# Patient Record
Sex: Male | Born: 1960 | Race: White | Hispanic: No | Marital: Single | Smoking: Current every day smoker
Health system: Southern US, Community
[De-identification: ages and names within clinical notes are randomized; demographics above are authoritative.]

## PROBLEM LIST (undated history)

## (undated) DIAGNOSIS — E039 Hypothyroidism, unspecified: Secondary | ICD-10-CM

## (undated) DIAGNOSIS — G629 Polyneuropathy, unspecified: Secondary | ICD-10-CM

## (undated) DIAGNOSIS — Z8709 Personal history of other diseases of the respiratory system: Secondary | ICD-10-CM

## (undated) DIAGNOSIS — Z9289 Personal history of other medical treatment: Secondary | ICD-10-CM

## (undated) DIAGNOSIS — Z8639 Personal history of other endocrine, nutritional and metabolic disease: Secondary | ICD-10-CM

## (undated) DIAGNOSIS — T50901A Poisoning by unspecified drugs, medicaments and biological substances, accidental (unintentional), initial encounter: Secondary | ICD-10-CM

## (undated) DIAGNOSIS — B1920 Unspecified viral hepatitis C without hepatic coma: Secondary | ICD-10-CM

## (undated) DIAGNOSIS — F259 Schizoaffective disorder, unspecified: Secondary | ICD-10-CM

## (undated) DIAGNOSIS — F4310 Post-traumatic stress disorder, unspecified: Secondary | ICD-10-CM

## (undated) DIAGNOSIS — F609 Personality disorder, unspecified: Secondary | ICD-10-CM

## (undated) DIAGNOSIS — R188 Other ascites: Secondary | ICD-10-CM

## (undated) DIAGNOSIS — N289 Disorder of kidney and ureter, unspecified: Secondary | ICD-10-CM

## (undated) DIAGNOSIS — M79606 Pain in leg, unspecified: Secondary | ICD-10-CM

## (undated) DIAGNOSIS — K219 Gastro-esophageal reflux disease without esophagitis: Secondary | ICD-10-CM

## (undated) DIAGNOSIS — D649 Anemia, unspecified: Secondary | ICD-10-CM

## (undated) DIAGNOSIS — G8929 Other chronic pain: Secondary | ICD-10-CM

## (undated) DIAGNOSIS — K7460 Unspecified cirrhosis of liver: Secondary | ICD-10-CM

## (undated) HISTORY — DX: Polyneuropathy, unspecified: G62.9

## (undated) HISTORY — PX: CERVICAL SPINE SURGERY: SHX589

---

## 1994-03-29 HISTORY — PX: OTHER SURGICAL HISTORY: SHX169

## 1998-01-09 ENCOUNTER — Emergency Department (HOSPITAL_COMMUNITY): Admission: EM | Admit: 1998-01-09 | Discharge: 1998-01-09 | Payer: Self-pay | Admitting: Emergency Medicine

## 1998-02-23 ENCOUNTER — Inpatient Hospital Stay: Admission: EM | Admit: 1998-02-23 | Discharge: 1998-03-14 | Payer: Self-pay | Admitting: *Deleted

## 1998-02-24 ENCOUNTER — Encounter: Payer: Self-pay | Admitting: *Deleted

## 1998-02-27 ENCOUNTER — Encounter: Payer: Self-pay | Admitting: General Surgery

## 1998-02-28 ENCOUNTER — Encounter: Payer: Self-pay | Admitting: Critical Care Medicine

## 1998-03-01 ENCOUNTER — Encounter: Payer: Self-pay | Admitting: Critical Care Medicine

## 1998-03-04 ENCOUNTER — Encounter: Payer: Self-pay | Admitting: Critical Care Medicine

## 1998-03-05 ENCOUNTER — Encounter: Payer: Self-pay | Admitting: Critical Care Medicine

## 1998-03-06 ENCOUNTER — Encounter: Payer: Self-pay | Admitting: Critical Care Medicine

## 1998-03-06 ENCOUNTER — Encounter: Payer: Self-pay | Admitting: *Deleted

## 1998-03-07 ENCOUNTER — Encounter: Payer: Self-pay | Admitting: *Deleted

## 1998-03-08 ENCOUNTER — Encounter: Payer: Self-pay | Admitting: Critical Care Medicine

## 1998-03-09 ENCOUNTER — Encounter: Payer: Self-pay | Admitting: *Deleted

## 1998-03-10 ENCOUNTER — Encounter: Payer: Self-pay | Admitting: Critical Care Medicine

## 1998-03-14 ENCOUNTER — Inpatient Hospital Stay (HOSPITAL_COMMUNITY)
Admission: RE | Admit: 1998-03-14 | Discharge: 1998-03-21 | Payer: Self-pay | Admitting: Physical Medicine and Rehabilitation

## 1998-03-20 ENCOUNTER — Encounter: Payer: Self-pay | Admitting: Physical Medicine and Rehabilitation

## 1998-04-07 ENCOUNTER — Emergency Department (HOSPITAL_COMMUNITY): Admission: EM | Admit: 1998-04-07 | Discharge: 1998-04-07 | Payer: Self-pay | Admitting: *Deleted

## 2000-07-17 ENCOUNTER — Inpatient Hospital Stay (HOSPITAL_COMMUNITY): Admission: AD | Admit: 2000-07-17 | Discharge: 2000-08-08 | Payer: Self-pay | Admitting: *Deleted

## 2001-02-08 ENCOUNTER — Emergency Department (HOSPITAL_COMMUNITY): Admission: EM | Admit: 2001-02-08 | Discharge: 2001-02-09 | Payer: Self-pay | Admitting: Emergency Medicine

## 2001-04-06 ENCOUNTER — Emergency Department (HOSPITAL_COMMUNITY): Admission: EM | Admit: 2001-04-06 | Discharge: 2001-04-06 | Payer: Self-pay | Admitting: Emergency Medicine

## 2001-05-29 ENCOUNTER — Encounter: Payer: Self-pay | Admitting: Internal Medicine

## 2001-05-29 ENCOUNTER — Encounter (INDEPENDENT_AMBULATORY_CARE_PROVIDER_SITE_OTHER): Payer: Self-pay

## 2001-05-29 ENCOUNTER — Ambulatory Visit (HOSPITAL_COMMUNITY): Admission: RE | Admit: 2001-05-29 | Discharge: 2001-05-29 | Payer: Self-pay | Admitting: Internal Medicine

## 2001-06-14 ENCOUNTER — Encounter: Payer: Self-pay | Admitting: Internal Medicine

## 2001-06-14 ENCOUNTER — Ambulatory Visit (HOSPITAL_COMMUNITY): Admission: RE | Admit: 2001-06-14 | Discharge: 2001-06-14 | Payer: Self-pay | Admitting: Internal Medicine

## 2001-11-10 ENCOUNTER — Ambulatory Visit (HOSPITAL_COMMUNITY): Admission: RE | Admit: 2001-11-10 | Discharge: 2001-11-10 | Payer: Self-pay | Admitting: Internal Medicine

## 2001-11-10 ENCOUNTER — Encounter: Payer: Self-pay | Admitting: Internal Medicine

## 2001-12-24 ENCOUNTER — Emergency Department (HOSPITAL_COMMUNITY): Admission: EM | Admit: 2001-12-24 | Discharge: 2001-12-24 | Payer: Self-pay

## 2002-01-02 ENCOUNTER — Emergency Department (HOSPITAL_COMMUNITY): Admission: EM | Admit: 2002-01-02 | Discharge: 2002-01-02 | Payer: Self-pay | Admitting: Emergency Medicine

## 2002-06-27 ENCOUNTER — Emergency Department (HOSPITAL_COMMUNITY): Admission: EM | Admit: 2002-06-27 | Discharge: 2002-06-27 | Payer: Self-pay | Admitting: Emergency Medicine

## 2003-04-06 ENCOUNTER — Emergency Department (HOSPITAL_COMMUNITY): Admission: EM | Admit: 2003-04-06 | Discharge: 2003-04-06 | Payer: Self-pay | Admitting: Emergency Medicine

## 2003-10-26 ENCOUNTER — Emergency Department (HOSPITAL_COMMUNITY): Admission: EM | Admit: 2003-10-26 | Discharge: 2003-10-26 | Payer: Self-pay | Admitting: Emergency Medicine

## 2003-11-24 ENCOUNTER — Emergency Department (HOSPITAL_COMMUNITY): Admission: EM | Admit: 2003-11-24 | Discharge: 2003-11-24 | Payer: Self-pay | Admitting: *Deleted

## 2004-01-21 ENCOUNTER — Emergency Department (HOSPITAL_COMMUNITY): Admission: EM | Admit: 2004-01-21 | Discharge: 2004-01-21 | Payer: Self-pay | Admitting: Emergency Medicine

## 2004-01-22 ENCOUNTER — Emergency Department (HOSPITAL_COMMUNITY): Admission: EM | Admit: 2004-01-22 | Discharge: 2004-01-22 | Payer: Self-pay | Admitting: Emergency Medicine

## 2004-03-04 ENCOUNTER — Ambulatory Visit: Payer: Self-pay | Admitting: Internal Medicine

## 2004-07-12 ENCOUNTER — Emergency Department (HOSPITAL_COMMUNITY): Admission: EM | Admit: 2004-07-12 | Discharge: 2004-07-13 | Payer: Self-pay | Admitting: Emergency Medicine

## 2004-11-12 ENCOUNTER — Emergency Department (HOSPITAL_COMMUNITY): Admission: EM | Admit: 2004-11-12 | Discharge: 2004-11-12 | Payer: Self-pay | Admitting: *Deleted

## 2004-12-26 ENCOUNTER — Emergency Department (HOSPITAL_COMMUNITY): Admission: EM | Admit: 2004-12-26 | Discharge: 2004-12-26 | Payer: Self-pay | Admitting: Emergency Medicine

## 2005-01-19 ENCOUNTER — Ambulatory Visit: Payer: Self-pay | Admitting: Internal Medicine

## 2005-01-23 ENCOUNTER — Emergency Department (HOSPITAL_COMMUNITY): Admission: EM | Admit: 2005-01-23 | Discharge: 2005-01-23 | Payer: Self-pay | Admitting: Emergency Medicine

## 2005-02-13 ENCOUNTER — Emergency Department (HOSPITAL_COMMUNITY): Admission: EM | Admit: 2005-02-13 | Discharge: 2005-02-13 | Payer: Self-pay | Admitting: Emergency Medicine

## 2005-02-24 ENCOUNTER — Ambulatory Visit: Payer: Self-pay | Admitting: Internal Medicine

## 2005-07-28 ENCOUNTER — Encounter: Payer: Self-pay | Admitting: *Deleted

## 2005-09-07 ENCOUNTER — Ambulatory Visit: Payer: Self-pay | Admitting: Internal Medicine

## 2006-05-25 ENCOUNTER — Ambulatory Visit: Payer: Self-pay | Admitting: Internal Medicine

## 2006-05-25 ENCOUNTER — Inpatient Hospital Stay (HOSPITAL_COMMUNITY): Admission: EM | Admit: 2006-05-25 | Discharge: 2006-05-28 | Payer: Self-pay | Admitting: Emergency Medicine

## 2006-07-05 ENCOUNTER — Inpatient Hospital Stay (HOSPITAL_COMMUNITY): Admission: EM | Admit: 2006-07-05 | Discharge: 2006-07-08 | Payer: Self-pay | Admitting: Emergency Medicine

## 2006-07-11 ENCOUNTER — Emergency Department (HOSPITAL_COMMUNITY): Admission: EM | Admit: 2006-07-11 | Discharge: 2006-07-11 | Payer: Self-pay | Admitting: *Deleted

## 2006-09-10 ENCOUNTER — Emergency Department (HOSPITAL_COMMUNITY): Admission: EM | Admit: 2006-09-10 | Discharge: 2006-09-10 | Payer: Self-pay | Admitting: Emergency Medicine

## 2007-08-31 ENCOUNTER — Emergency Department (HOSPITAL_COMMUNITY): Admission: EM | Admit: 2007-08-31 | Discharge: 2007-08-31 | Payer: Self-pay | Admitting: Emergency Medicine

## 2007-09-28 ENCOUNTER — Ambulatory Visit: Payer: Self-pay | Admitting: Internal Medicine

## 2008-08-02 ENCOUNTER — Inpatient Hospital Stay (HOSPITAL_COMMUNITY): Admission: AD | Admit: 2008-08-02 | Discharge: 2008-08-12 | Payer: Self-pay | Admitting: Psychiatry

## 2008-08-02 ENCOUNTER — Ambulatory Visit: Payer: Self-pay | Admitting: Psychiatry

## 2008-09-05 ENCOUNTER — Ambulatory Visit: Payer: Self-pay | Admitting: Internal Medicine

## 2008-09-05 LAB — CONVERTED CEMR LAB
Albumin: 3.7 g/dL (ref 3.5–5.2)
CO2: 24 meq/L (ref 19–32)
Calcium: 9.7 mg/dL (ref 8.4–10.5)
Chloride: 108 meq/L (ref 96–112)
Glucose, Bld: 103 mg/dL — ABNORMAL HIGH (ref 70–99)
Potassium: 4.3 meq/L (ref 3.5–5.3)
Sodium: 145 meq/L (ref 135–145)
Total Bilirubin: 0.6 mg/dL (ref 0.3–1.2)
Total Protein: 6.7 g/dL (ref 6.0–8.3)
Valproic Acid Lvl: 115.6 ug/mL — ABNORMAL HIGH (ref 50.0–100.0)

## 2009-03-11 ENCOUNTER — Ambulatory Visit: Payer: Self-pay | Admitting: Internal Medicine

## 2009-03-17 ENCOUNTER — Ambulatory Visit (HOSPITAL_COMMUNITY): Admission: RE | Admit: 2009-03-17 | Discharge: 2009-03-17 | Payer: Self-pay | Admitting: Internal Medicine

## 2009-04-24 ENCOUNTER — Ambulatory Visit: Payer: Self-pay | Admitting: Internal Medicine

## 2009-12-05 ENCOUNTER — Inpatient Hospital Stay (HOSPITAL_COMMUNITY): Admission: EM | Admit: 2009-12-05 | Discharge: 2009-12-09 | Payer: Self-pay | Admitting: Psychiatry

## 2009-12-05 ENCOUNTER — Ambulatory Visit: Payer: Self-pay | Admitting: Psychiatry

## 2009-12-27 DIAGNOSIS — T50901A Poisoning by unspecified drugs, medicaments and biological substances, accidental (unintentional), initial encounter: Secondary | ICD-10-CM

## 2009-12-27 HISTORY — DX: Poisoning by unspecified drugs, medicaments and biological substances, accidental (unintentional), initial encounter: T50.901A

## 2010-01-12 ENCOUNTER — Inpatient Hospital Stay (HOSPITAL_COMMUNITY)
Admission: EM | Admit: 2010-01-12 | Discharge: 2010-02-10 | Payer: Self-pay | Source: Home / Self Care | Admitting: Emergency Medicine

## 2010-01-12 ENCOUNTER — Ambulatory Visit: Payer: Self-pay | Admitting: Pulmonary Disease

## 2010-01-13 ENCOUNTER — Ambulatory Visit: Payer: Self-pay | Admitting: Cardiology

## 2010-01-13 ENCOUNTER — Encounter (INDEPENDENT_AMBULATORY_CARE_PROVIDER_SITE_OTHER): Payer: Self-pay | Admitting: Internal Medicine

## 2010-01-15 ENCOUNTER — Encounter (INDEPENDENT_AMBULATORY_CARE_PROVIDER_SITE_OTHER): Payer: Self-pay | Admitting: Internal Medicine

## 2010-01-21 HISTORY — PX: OTHER SURGICAL HISTORY: SHX169

## 2010-01-30 ENCOUNTER — Encounter (INDEPENDENT_AMBULATORY_CARE_PROVIDER_SITE_OTHER): Payer: Self-pay | Admitting: Family Medicine

## 2010-01-31 ENCOUNTER — Encounter (INDEPENDENT_AMBULATORY_CARE_PROVIDER_SITE_OTHER): Payer: Self-pay | Admitting: Family Medicine

## 2010-02-04 DIAGNOSIS — F259 Schizoaffective disorder, unspecified: Secondary | ICD-10-CM

## 2010-02-09 DIAGNOSIS — F259 Schizoaffective disorder, unspecified: Secondary | ICD-10-CM

## 2010-02-16 ENCOUNTER — Ambulatory Visit: Payer: Self-pay | Admitting: Vascular Surgery

## 2010-04-29 ENCOUNTER — Encounter (INDEPENDENT_AMBULATORY_CARE_PROVIDER_SITE_OTHER): Payer: Self-pay | Admitting: Family Medicine

## 2010-04-29 LAB — CONVERTED CEMR LAB
ALT: 10 units/L (ref 0–53)
AST: 27 units/L (ref 0–37)
Alkaline Phosphatase: 100 units/L (ref 39–117)
Sodium: 138 meq/L (ref 135–145)
Total Bilirubin: 1.5 mg/dL — ABNORMAL HIGH (ref 0.3–1.2)
Total Protein: 7.5 g/dL (ref 6.0–8.3)

## 2010-05-14 ENCOUNTER — Emergency Department (HOSPITAL_COMMUNITY)
Admission: EM | Admit: 2010-05-14 | Discharge: 2010-05-15 | Disposition: A | Discharge status: Home / Self Care | Payer: Medicaid Other | Attending: Emergency Medicine | Admitting: Emergency Medicine

## 2010-05-14 ENCOUNTER — Emergency Department (HOSPITAL_COMMUNITY): Payer: Medicaid Other

## 2010-05-14 DIAGNOSIS — F4310 Post-traumatic stress disorder, unspecified: Secondary | ICD-10-CM | POA: Insufficient documentation

## 2010-05-14 DIAGNOSIS — F259 Schizoaffective disorder, unspecified: Secondary | ICD-10-CM | POA: Insufficient documentation

## 2010-05-14 DIAGNOSIS — F29 Unspecified psychosis not due to a substance or known physiological condition: Secondary | ICD-10-CM | POA: Insufficient documentation

## 2010-05-14 LAB — DIFFERENTIAL
Basophils Relative: 1 % (ref 0–1)
Eosinophils Absolute: 0.5 10*3/uL (ref 0.0–0.7)
Lymphs Abs: 3.3 10*3/uL (ref 0.7–4.0)
Monocytes Absolute: 0.5 10*3/uL (ref 0.1–1.0)
Monocytes Relative: 5 % (ref 3–12)

## 2010-05-14 LAB — COMPREHENSIVE METABOLIC PANEL
Albumin: 3.9 g/dL (ref 3.5–5.2)
BUN: 6 mg/dL (ref 6–23)
Calcium: 10.1 mg/dL (ref 8.4–10.5)
Chloride: 107 mEq/L (ref 96–112)
Creatinine, Ser: 1.35 mg/dL (ref 0.4–1.5)
Total Bilirubin: 1.7 mg/dL — ABNORMAL HIGH (ref 0.3–1.2)

## 2010-05-14 LAB — SALICYLATE LEVEL: Salicylate Lvl: <4 mg/dL (ref 2.8–20.0)

## 2010-05-14 LAB — CBC
MCH: 32.3 pg (ref 26.0–34.0)
MCHC: 34.9 g/dL (ref 30.0–36.0)
MCV: 92.7 fL (ref 78.0–100.0)
Platelets: 138 10*3/uL — ABNORMAL LOW (ref 150–400)
RDW: 14.2 % (ref 11.5–15.5)

## 2010-05-14 LAB — ACETAMINOPHEN LEVEL: Acetaminophen (Tylenol), Serum: <10 ug/mL — ABNORMAL LOW (ref 10–30)

## 2010-05-15 DIAGNOSIS — F39 Unspecified mood [affective] disorder: Secondary | ICD-10-CM

## 2010-05-15 LAB — URINALYSIS, ROUTINE W REFLEX MICROSCOPIC
Ketones, ur: NEGATIVE mg/dL
Urine Glucose, Fasting: NEGATIVE mg/dL
pH: 6 (ref 5.0–8.0)

## 2010-05-15 LAB — RAPID URINE DRUG SCREEN, HOSP PERFORMED
Barbiturates: NOT DETECTED
Cocaine: NOT DETECTED

## 2010-05-17 NOTE — Consult Note (Signed)
  NAME:  [name], [name] NO.:  [id number]  MEDICAL RECORD NO.:  [id number]           PATIENT TYPE:  E  LOCATION:  WLED                         FACILITY:  [facility]  PHYSICIAN:  Eulogio Ditch, MD DATE OF BIRTH:  1960/11/26  DATE OF CONSULTATION: DATE OF DISCHARGE:                                CONSULTATION   HISTORY OF PRESENT ILLNESS:  I saw the patient and reviewed the comprehensive assessment part 1 and also the medical records in the past.  Last consult done by me and the discharge summary by Dr. Electa Sniff. The patient was brought to the hospital by the case manager.  As per them, the patient complained of not feeling well physically and also his parents want the patient to be evaluated because of his behavior for the last few days.  He is not okay.  The patient has been agitated and aggressive at home, not sleeping or eating for the last several days. When I saw the patient, the patient is very logical and goal directed. I have seen this patient in the past and he was in a very bad shape, but at this time he is much intact.  He denied any suicidal or homicidal ideations.  He is not entirely preoccupied, not responding to any stimuli.  The patient denies hearing any voices.  The patient told me that he is followed by the ACT team and they are involved with his care very actively.  The patient also told me that he is compliant with medication and he denied any side effects from Geodon or Zoloft.  MENTAL STATUS EXAMINATION:  The patient is calm and cooperative during interview.  Fair eye contact.  Hygiene and grooming normal.  Speech normal.  No psychomotor agitation or retardation noted during the interview.  Mood, euthymic.  Affect and mood congruent.  Thought process logical and goal directed.  Thought content not suicidal or homicidal, not delusional thought perception.  No audiovisual hallucination reported.  Not internally preoccupied.  Cognition alert,  awake, and oriented x3.  Memory immediate, recent remote fair.  Attention, concentration fair.  Abstraction ability good.  Fund of knowledge fair. Insight and judgment intact.  DIAGNOSES:  AXIS I:  Schizoaffective disorder, posttraumatic stress disorder. AXIS II:  Deferred. AXIS III:  Hypertension, gout, and see the medical notes also. AXIS IV:  Psychosocial issues and long mental history. AXIS V:  50 to 55.  RECOMMENDATIONS: 1. We will speak with the patient's parents who was sent the patient     for evaluation. 2. We will also speak with the case worker of the ACT team. 3. Once we get collateral information we will make decision whether     this patient needs further observation or the admission in the     hospital for few days.    Eulogio Ditch, MD    SA/MEDQ  D:  05/15/2010  T:  05/15/2010  Job:  [id number]  Electronically Signed by Eulogio Ditch  on 05/17/2010 06:09:05 AM

## 2010-06-09 LAB — GLUCOSE, CAPILLARY
Glucose-Capillary: 102 mg/dL — ABNORMAL HIGH (ref 70–99)
Glucose-Capillary: 103 mg/dL — ABNORMAL HIGH (ref 70–99)
Glucose-Capillary: 103 mg/dL — ABNORMAL HIGH (ref 70–99)
Glucose-Capillary: 104 mg/dL — ABNORMAL HIGH (ref 70–99)
Glucose-Capillary: 106 mg/dL — ABNORMAL HIGH (ref 70–99)
Glucose-Capillary: 107 mg/dL — ABNORMAL HIGH (ref 70–99)
Glucose-Capillary: 108 mg/dL — ABNORMAL HIGH (ref 70–99)
Glucose-Capillary: 108 mg/dL — ABNORMAL HIGH (ref 70–99)
Glucose-Capillary: 109 mg/dL — ABNORMAL HIGH (ref 70–99)
Glucose-Capillary: 113 mg/dL — ABNORMAL HIGH (ref 70–99)
Glucose-Capillary: 115 mg/dL — ABNORMAL HIGH (ref 70–99)
Glucose-Capillary: 115 mg/dL — ABNORMAL HIGH (ref 70–99)
Glucose-Capillary: 119 mg/dL — ABNORMAL HIGH (ref 70–99)
Glucose-Capillary: 121 mg/dL — ABNORMAL HIGH (ref 70–99)
Glucose-Capillary: 124 mg/dL — ABNORMAL HIGH (ref 70–99)
Glucose-Capillary: 126 mg/dL — ABNORMAL HIGH (ref 70–99)
Glucose-Capillary: 126 mg/dL — ABNORMAL HIGH (ref 70–99)
Glucose-Capillary: 128 mg/dL — ABNORMAL HIGH (ref 70–99)
Glucose-Capillary: 134 mg/dL — ABNORMAL HIGH (ref 70–99)
Glucose-Capillary: 134 mg/dL — ABNORMAL HIGH (ref 70–99)
Glucose-Capillary: 145 mg/dL — ABNORMAL HIGH (ref 70–99)
Glucose-Capillary: 146 mg/dL — ABNORMAL HIGH (ref 70–99)
Glucose-Capillary: 147 mg/dL — ABNORMAL HIGH (ref 70–99)
Glucose-Capillary: 148 mg/dL — ABNORMAL HIGH (ref 70–99)
Glucose-Capillary: 150 mg/dL — ABNORMAL HIGH (ref 70–99)
Glucose-Capillary: 163 mg/dL — ABNORMAL HIGH (ref 70–99)
Glucose-Capillary: 174 mg/dL — ABNORMAL HIGH (ref 70–99)
Glucose-Capillary: 513 mg/dL — ABNORMAL HIGH (ref 70–99)
Glucose-Capillary: 62 mg/dL — ABNORMAL LOW (ref 70–99)
Glucose-Capillary: 72 mg/dL (ref 70–99)
Glucose-Capillary: 73 mg/dL (ref 70–99)
Glucose-Capillary: 73 mg/dL (ref 70–99)
Glucose-Capillary: 75 mg/dL (ref 70–99)
Glucose-Capillary: 76 mg/dL (ref 70–99)
Glucose-Capillary: 80 mg/dL (ref 70–99)
Glucose-Capillary: 81 mg/dL (ref 70–99)
Glucose-Capillary: 84 mg/dL (ref 70–99)
Glucose-Capillary: 85 mg/dL (ref 70–99)
Glucose-Capillary: 85 mg/dL (ref 70–99)
Glucose-Capillary: 88 mg/dL (ref 70–99)
Glucose-Capillary: 89 mg/dL (ref 70–99)
Glucose-Capillary: 89 mg/dL (ref 70–99)
Glucose-Capillary: 92 mg/dL (ref 70–99)
Glucose-Capillary: 93 mg/dL (ref 70–99)
Glucose-Capillary: 95 mg/dL (ref 70–99)
Glucose-Capillary: 95 mg/dL (ref 70–99)
Glucose-Capillary: 95 mg/dL (ref 70–99)
Glucose-Capillary: 96 mg/dL (ref 70–99)
Glucose-Capillary: 97 mg/dL (ref 70–99)
Glucose-Capillary: 97 mg/dL (ref 70–99)
Glucose-Capillary: 99 mg/dL (ref 70–99)

## 2010-06-09 LAB — BLOOD GAS, ARTERIAL
Drawn by: 331461
O2 Content: 2 L/min
O2 Content: 5 L/min
Patient temperature: 98.6
TCO2: 31.5 mmol/L (ref 0–100)
pCO2 arterial: 38.6 mmHg (ref 35.0–45.0)
pH, Arterial: 7.424 (ref 7.350–7.450)
pH, Arterial: 7.454 — ABNORMAL HIGH (ref 7.350–7.450)

## 2010-06-09 LAB — COMPREHENSIVE METABOLIC PANEL
ALT: 12 U/L (ref 0–53)
ALT: 13 U/L (ref 0–53)
ALT: 14 U/L (ref 0–53)
ALT: 15 U/L (ref 0–53)
ALT: 15 U/L (ref 0–53)
ALT: 15 U/L (ref 0–53)
ALT: 15 U/L (ref 0–53)
AST: 19 U/L (ref 0–37)
AST: 24 U/L (ref 0–37)
AST: 27 U/L (ref 0–37)
AST: 27 U/L (ref 0–37)
AST: 28 U/L (ref 0–37)
AST: 29 U/L (ref 0–37)
Albumin: 2.1 g/dL — ABNORMAL LOW (ref 3.5–5.2)
Albumin: 2.3 g/dL — ABNORMAL LOW (ref 3.5–5.2)
Albumin: 2.4 g/dL — ABNORMAL LOW (ref 3.5–5.2)
Alkaline Phosphatase: 57 U/L (ref 39–117)
Alkaline Phosphatase: 63 U/L (ref 39–117)
Alkaline Phosphatase: 69 U/L (ref 39–117)
BUN: 44 mg/dL — ABNORMAL HIGH (ref 6–23)
BUN: 52 mg/dL — ABNORMAL HIGH (ref 6–23)
BUN: 57 mg/dL — ABNORMAL HIGH (ref 6–23)
BUN: 64 mg/dL — ABNORMAL HIGH (ref 6–23)
BUN: 76 mg/dL — ABNORMAL HIGH (ref 6–23)
CO2: 32 mEq/L (ref 19–32)
CO2: 32 mEq/L (ref 19–32)
CO2: 34 mEq/L — ABNORMAL HIGH (ref 19–32)
CO2: 34 mEq/L — ABNORMAL HIGH (ref 19–32)
CO2: 34 mEq/L — ABNORMAL HIGH (ref 19–32)
CO2: 35 mEq/L — ABNORMAL HIGH (ref 19–32)
CO2: 36 mEq/L — ABNORMAL HIGH (ref 19–32)
CO2: 37 mEq/L — ABNORMAL HIGH (ref 19–32)
Calcium: 8.4 mg/dL (ref 8.4–10.5)
Calcium: 9 mg/dL (ref 8.4–10.5)
Calcium: 9 mg/dL (ref 8.4–10.5)
Calcium: 9 mg/dL (ref 8.4–10.5)
Calcium: 9.1 mg/dL (ref 8.4–10.5)
Calcium: 9.2 mg/dL (ref 8.4–10.5)
Calcium: 9.2 mg/dL (ref 8.4–10.5)
Calcium: 9.3 mg/dL (ref 8.4–10.5)
Calcium: 9.5 mg/dL (ref 8.4–10.5)
Chloride: 101 mEq/L (ref 96–112)
Chloride: 109 mEq/L (ref 96–112)
Chloride: 112 mEq/L (ref 96–112)
Chloride: 98 mEq/L (ref 96–112)
Chloride: 99 mEq/L (ref 96–112)
Creatinine, Ser: 1.85 mg/dL — ABNORMAL HIGH (ref 0.4–1.5)
Creatinine, Ser: 1.9 mg/dL — ABNORMAL HIGH (ref 0.4–1.5)
Creatinine, Ser: 1.93 mg/dL — ABNORMAL HIGH (ref 0.4–1.5)
Creatinine, Ser: 2.25 mg/dL — ABNORMAL HIGH (ref 0.4–1.5)
Creatinine, Ser: 2.39 mg/dL — ABNORMAL HIGH (ref 0.4–1.5)
Creatinine, Ser: 2.55 mg/dL — ABNORMAL HIGH (ref 0.4–1.5)
GFR calc Af Amer: 33 mL/min — ABNORMAL LOW (ref >60)
GFR calc Af Amer: 38 mL/min — ABNORMAL LOW (ref >60)
GFR calc Af Amer: 41 mL/min — ABNORMAL LOW (ref >60)
GFR calc Af Amer: 42 mL/min — ABNORMAL LOW (ref >60)
GFR calc Af Amer: 46 mL/min — ABNORMAL LOW (ref >60)
GFR calc Af Amer: 46 mL/min — ABNORMAL LOW (ref >60)
GFR calc Af Amer: 49 mL/min — ABNORMAL LOW (ref >60)
GFR calc non Af Amer: 27 mL/min — ABNORMAL LOW (ref >60)
GFR calc non Af Amer: 29 mL/min — ABNORMAL LOW (ref >60)
GFR calc non Af Amer: 31 mL/min — ABNORMAL LOW (ref >60)
GFR calc non Af Amer: 35 mL/min — ABNORMAL LOW (ref >60)
GFR calc non Af Amer: 36 mL/min — ABNORMAL LOW (ref >60)
GFR calc non Af Amer: 38 mL/min — ABNORMAL LOW (ref >60)
GFR calc non Af Amer: 38 mL/min — ABNORMAL LOW (ref >60)
GFR calc non Af Amer: 39 mL/min — ABNORMAL LOW (ref >60)
GFR calc non Af Amer: 41 mL/min — ABNORMAL LOW (ref >60)
Glucose, Bld: 111 mg/dL — ABNORMAL HIGH (ref 70–99)
Glucose, Bld: 71 mg/dL (ref 70–99)
Glucose, Bld: 73 mg/dL (ref 70–99)
Glucose, Bld: 76 mg/dL (ref 70–99)
Glucose, Bld: 81 mg/dL (ref 70–99)
Glucose, Bld: 88 mg/dL (ref 70–99)
Glucose, Bld: 89 mg/dL (ref 70–99)
Glucose, Bld: 90 mg/dL (ref 70–99)
Glucose, Bld: 91 mg/dL (ref 70–99)
Potassium: 3.9 mEq/L (ref 3.5–5.1)
Potassium: 3.9 mEq/L (ref 3.5–5.1)
Potassium: 4.3 mEq/L (ref 3.5–5.1)
Sodium: 138 mEq/L (ref 135–145)
Sodium: 140 mEq/L (ref 135–145)
Sodium: 150 mEq/L — ABNORMAL HIGH (ref 135–145)
Sodium: 150 mEq/L — ABNORMAL HIGH (ref 135–145)
Sodium: 151 mEq/L — ABNORMAL HIGH (ref 135–145)
Total Bilirubin: 0.6 mg/dL (ref 0.3–1.2)
Total Bilirubin: 0.6 mg/dL (ref 0.3–1.2)
Total Bilirubin: 0.7 mg/dL (ref 0.3–1.2)
Total Bilirubin: 0.7 mg/dL (ref 0.3–1.2)
Total Bilirubin: 0.9 mg/dL (ref 0.3–1.2)
Total Protein: 5.5 g/dL — ABNORMAL LOW (ref 6.0–8.3)
Total Protein: 5.7 g/dL — ABNORMAL LOW (ref 6.0–8.3)
Total Protein: 5.8 g/dL — ABNORMAL LOW (ref 6.0–8.3)
Total Protein: 6 g/dL (ref 6.0–8.3)
Total Protein: 6.1 g/dL (ref 6.0–8.3)
Total Protein: 6.4 g/dL (ref 6.0–8.3)

## 2010-06-09 LAB — CBC
HCT: 26 % — ABNORMAL LOW (ref 39.0–52.0)
HCT: 26.5 % — ABNORMAL LOW (ref 39.0–52.0)
HCT: 27.5 % — ABNORMAL LOW (ref 39.0–52.0)
HCT: 28.3 % — ABNORMAL LOW (ref 39.0–52.0)
HCT: 29.6 % — ABNORMAL LOW (ref 39.0–52.0)
Hemoglobin: 8.5 g/dL — ABNORMAL LOW (ref 13.0–17.0)
Hemoglobin: 8.9 g/dL — ABNORMAL LOW (ref 13.0–17.0)
Hemoglobin: 8.9 g/dL — ABNORMAL LOW (ref 13.0–17.0)
Hemoglobin: 8.9 g/dL — ABNORMAL LOW (ref 13.0–17.0)
Hemoglobin: 9.2 g/dL — ABNORMAL LOW (ref 13.0–17.0)
Hemoglobin: 9.4 g/dL — ABNORMAL LOW (ref 13.0–17.0)
MCH: 34.1 pg — ABNORMAL HIGH (ref 26.0–34.0)
MCH: 34.2 pg — ABNORMAL HIGH (ref 26.0–34.0)
MCH: 34.3 pg — ABNORMAL HIGH (ref 26.0–34.0)
MCH: 34.8 pg — ABNORMAL HIGH (ref 26.0–34.0)
MCHC: 33.4 g/dL (ref 30.0–36.0)
MCHC: 33.5 g/dL (ref 30.0–36.0)
MCHC: 33.7 g/dL (ref 30.0–36.0)
MCHC: 33.8 g/dL (ref 30.0–36.0)
MCHC: 34.2 g/dL (ref 30.0–36.0)
MCHC: 34.3 g/dL (ref 30.0–36.0)
MCHC: 34.3 g/dL (ref 30.0–36.0)
MCV: 101.3 fL — ABNORMAL HIGH (ref 78.0–100.0)
MCV: 101.6 fL — ABNORMAL HIGH (ref 78.0–100.0)
MCV: 102.4 fL — ABNORMAL HIGH (ref 78.0–100.0)
MCV: 102.5 fL — ABNORMAL HIGH (ref 78.0–100.0)
MCV: 102.7 fL — ABNORMAL HIGH (ref 78.0–100.0)
Platelets: 103 10*3/uL — ABNORMAL LOW (ref 150–400)
Platelets: 111 10*3/uL — ABNORMAL LOW (ref 150–400)
Platelets: 170 10*3/uL (ref 150–400)
RBC: 2.58 MIL/uL — ABNORMAL LOW (ref 4.22–5.81)
RBC: 2.6 MIL/uL — ABNORMAL LOW (ref 4.22–5.81)
RBC: 2.77 MIL/uL — ABNORMAL LOW (ref 4.22–5.81)
RBC: 2.8 MIL/uL — ABNORMAL LOW (ref 4.22–5.81)
RBC: 2.85 MIL/uL — ABNORMAL LOW (ref 4.22–5.81)
RDW: 14.9 % (ref 11.5–15.5)
RDW: 15 % (ref 11.5–15.5)
RDW: 15.5 % (ref 11.5–15.5)
RDW: 15.8 % — ABNORMAL HIGH (ref 11.5–15.5)
RDW: 15.9 % — ABNORMAL HIGH (ref 11.5–15.5)
RDW: 15.9 % — ABNORMAL HIGH (ref 11.5–15.5)
RDW: 16 % — ABNORMAL HIGH (ref 11.5–15.5)
WBC: 4.7 10*3/uL (ref 4.0–10.5)
WBC: 5 10*3/uL (ref 4.0–10.5)
WBC: 6.1 10*3/uL (ref 4.0–10.5)
WBC: 7 10*3/uL (ref 4.0–10.5)

## 2010-06-09 LAB — BASIC METABOLIC PANEL
BUN: 84 mg/dL — ABNORMAL HIGH (ref 6–23)
CO2: 31 mEq/L (ref 19–32)
CO2: 33 mEq/L — ABNORMAL HIGH (ref 19–32)
Calcium: 9 mg/dL (ref 8.4–10.5)
Chloride: 114 mEq/L — ABNORMAL HIGH (ref 96–112)
GFR calc Af Amer: 44 mL/min — ABNORMAL LOW (ref >60)
GFR calc non Af Amer: 37 mL/min — ABNORMAL LOW (ref >60)
GFR calc non Af Amer: 42 mL/min — ABNORMAL LOW (ref >60)
Glucose, Bld: 78 mg/dL (ref 70–99)
Potassium: 3.9 mEq/L (ref 3.5–5.1)
Potassium: 4.3 mEq/L (ref 3.5–5.1)
Sodium: 137 mEq/L (ref 135–145)
Sodium: 149 mEq/L — ABNORMAL HIGH (ref 135–145)
Sodium: 151 mEq/L — ABNORMAL HIGH (ref 135–145)
Sodium: 152 mEq/L — ABNORMAL HIGH (ref 135–145)

## 2010-06-09 LAB — PHOSPHORUS
Phosphorus: 3.6 mg/dL (ref 2.3–4.6)
Phosphorus: 5.1 mg/dL — ABNORMAL HIGH (ref 2.3–4.6)
Phosphorus: 5.1 mg/dL — ABNORMAL HIGH (ref 2.3–4.6)
Phosphorus: 6.6 mg/dL — ABNORMAL HIGH (ref 2.3–4.6)

## 2010-06-09 LAB — CARDIAC PANEL(CRET KIN+CKTOT+MB+TROPI)
CK, MB: 0.7 ng/mL (ref 0.3–4.0)
CK, MB: 0.8 ng/mL (ref 0.3–4.0)
CK, MB: 1 ng/mL (ref 0.3–4.0)
Relative Index: INVALID (ref 0.0–2.5)
Total CK: 27 U/L (ref 7–232)
Troponin I: 0.02 ng/mL (ref 0.00–0.06)
Troponin I: 0.05 ng/mL (ref 0.00–0.06)

## 2010-06-09 LAB — MAGNESIUM
Magnesium: 2.1 mg/dL (ref 1.5–2.5)
Magnesium: 2.1 mg/dL (ref 1.5–2.5)
Magnesium: 2.2 mg/dL (ref 1.5–2.5)
Magnesium: 2.2 mg/dL (ref 1.5–2.5)
Magnesium: 2.5 mg/dL (ref 1.5–2.5)
Magnesium: 3 mg/dL — ABNORMAL HIGH (ref 1.5–2.5)

## 2010-06-09 LAB — HEPARIN INDUCED THROMBOCYTOPENIA PNL
Heparin Induced Plt Ab: POSITIVE
Patient O.D.: 0.478
UFH Low Dose 0.1 IU/mL: 0 % Release
UFH Low Dose 0.5 IU/mL: 0 % Release
UFH SRA Result: NEGATIVE

## 2010-06-09 LAB — VALPROIC ACID LEVEL: Valproic Acid Lvl: 72.5 ug/mL (ref 50.0–100.0)

## 2010-06-09 LAB — D-DIMER, QUANTITATIVE: D-Dimer, Quant: 2.87 ug/mL-FEU — ABNORMAL HIGH (ref 0.00–0.48)

## 2010-06-09 LAB — BRAIN NATRIURETIC PEPTIDE: Pro B Natriuretic peptide (BNP): NEGATIVE pg/mL (ref 0.0–100.0)

## 2010-06-10 LAB — BLOOD GAS, ARTERIAL
Acid-Base Excess: 0.2 mmol/L (ref 0.0–2.0)
Acid-Base Excess: 0.6 mmol/L (ref 0.0–2.0)
Acid-Base Excess: 2.1 mmol/L — ABNORMAL HIGH (ref 0.0–2.0)
Acid-Base Excess: 2.9 mmol/L — ABNORMAL HIGH (ref 0.0–2.0)
Acid-Base Excess: 3.4 mmol/L — ABNORMAL HIGH (ref 0.0–2.0)
Acid-Base Excess: 0.9 mmol/L (ref 0.0–2.0)
Acid-Base Excess: 1.6 mmol/L (ref 0.0–2.0)
Acid-base deficit: 0.4 mmol/L (ref 0.0–2.0)
Acid-base deficit: 1.6 mmol/L (ref 0.0–2.0)
Acid-base deficit: 2.3 mmol/L — ABNORMAL HIGH (ref 0.0–2.0)
Bicarbonate: 21.2 mEq/L (ref 20.0–24.0)
Bicarbonate: 21.4 mEq/L (ref 20.0–24.0)
Bicarbonate: 22.3 mEq/L (ref 20.0–24.0)
Bicarbonate: 22.6 mEq/L (ref 20.0–24.0)
Bicarbonate: 23.6 mEq/L (ref 20.0–24.0)
Bicarbonate: 25.5 mEq/L — ABNORMAL HIGH (ref 20.0–24.0)
Bicarbonate: 29.5 mEq/L — ABNORMAL HIGH (ref 20.0–24.0)
Bicarbonate: 24.4 mEq/L — ABNORMAL HIGH (ref 20.0–24.0)
Bicarbonate: 24.9 mEq/L — ABNORMAL HIGH (ref 20.0–24.0)
Bicarbonate: 25.2 mEq/L — ABNORMAL HIGH (ref 20.0–24.0)
Drawn by: 23588
Drawn by: 23588
Drawn by: 252031
Drawn by: 277551
Drawn by: 295031
Drawn by: 331461
Drawn by: 331461
Drawn by: 331461
Drawn by: 331461
Drawn by: 336861
Drawn by: 336861
Drawn by: 129711
FIO2: 0.6 %
FIO2: 0.6 %
FIO2: 0.7 %
FIO2: 0.8 %
FIO2: 1 %
FIO2: 1 %
FIO2: 1 %
FIO2: 1 %
FIO2: 1 %
FIO2: 0.45 %
FIO2: 0.5 %
MECHVT: 0.6 mL
MECHVT: 600 mL
MECHVT: 600 mL
MECHVT: 600 mL
MECHVT: 600 mL
MECHVT: 780 mL
MECHVT: 780 mL
MECHVT: 780 mL
MECHVT: 780 mL
O2 Saturation: 78.8 %
O2 Saturation: 81.6 %
O2 Saturation: 88.1 %
O2 Saturation: 88.6 %
O2 Saturation: 91 %
O2 Saturation: 93.8 %
O2 Saturation: 94.7 %
O2 Saturation: 95 %
O2 Saturation: 89.1 %
O2 Saturation: 92.8 %
PEEP: 10 cmH2O
PEEP: 12 cmH2O
PEEP: 14 cmH2O
PEEP: 16 cmH2O
PEEP: 5 cmH2O
PEEP: 8 cmH2O
PEEP: 10 cmH2O
PEEP: 8 cmH2O
Patient temperature: 98.6
Patient temperature: 98.6
Patient temperature: 98.6
Patient temperature: 98.6
Patient temperature: 98.6
Patient temperature: 98.6
Patient temperature: 98.6
Patient temperature: 98.6
Patient temperature: 98.6
RATE: 20 resp/min
RATE: 24 resp/min
RATE: 24 resp/min
RATE: 24 resp/min
RATE: 27 resp/min
RATE: 27 resp/min
RATE: 27 resp/min
RATE: 27 resp/min
RATE: 27 resp/min
RATE: 27 resp/min
TCO2: 20 mmol/L (ref 0–100)
TCO2: 20.1 mmol/L (ref 0–100)
TCO2: 20.8 mmol/L (ref 0–100)
TCO2: 20.9 mmol/L (ref 0–100)
TCO2: 21.3 mmol/L (ref 0–100)
TCO2: 27.8 mmol/L (ref 0–100)
TCO2: 23.4 mmol/L (ref 0–100)
TCO2: 23.5 mmol/L (ref 0–100)
pCO2 arterial: 27.5 mmHg — ABNORMAL LOW (ref 35.0–45.0)
pCO2 arterial: 30.7 mmHg — ABNORMAL LOW (ref 35.0–45.0)
pCO2 arterial: 31.5 mmHg — ABNORMAL LOW (ref 35.0–45.0)
pCO2 arterial: 34.4 mmHg — ABNORMAL LOW (ref 35.0–45.0)
pCO2 arterial: 37.2 mmHg (ref 35.0–45.0)
pCO2 arterial: 37.4 mmHg (ref 35.0–45.0)
pCO2 arterial: 55.4 mmHg — ABNORMAL HIGH (ref 35.0–45.0)
pCO2 arterial: 39.5 mmHg (ref 35.0–45.0)
pH, Arterial: 7.377 (ref 7.350–7.450)
pH, Arterial: 7.378 (ref 7.350–7.450)
pH, Arterial: 7.414 (ref 7.350–7.450)
pH, Arterial: 7.448 (ref 7.350–7.450)
pH, Arterial: 7.461 — ABNORMAL HIGH (ref 7.350–7.450)
pH, Arterial: 7.468 — ABNORMAL HIGH (ref 7.350–7.450)
pH, Arterial: 7.475 — ABNORMAL HIGH (ref 7.350–7.450)
pH, Arterial: 7.525 — ABNORMAL HIGH (ref 7.350–7.450)
pH, Arterial: 7.584 — ABNORMAL HIGH (ref 7.350–7.450)
pH, Arterial: 7.399 (ref 7.350–7.450)
pH, Arterial: 7.416 (ref 7.350–7.450)
pH, Arterial: 7.448 (ref 7.350–7.450)
pO2, Arterial: 117 mmHg — ABNORMAL HIGH (ref 80.0–100.0)
pO2, Arterial: 36.9 mmHg — CL (ref 80.0–100.0)
pO2, Arterial: 53.6 mmHg — ABNORMAL LOW (ref 80.0–100.0)
pO2, Arterial: 55.1 mmHg — ABNORMAL LOW (ref 80.0–100.0)
pO2, Arterial: 56.8 mmHg — ABNORMAL LOW (ref 80.0–100.0)
pO2, Arterial: 74 mmHg — ABNORMAL LOW (ref 80.0–100.0)
pO2, Arterial: 76.9 mmHg — ABNORMAL LOW (ref 80.0–100.0)
pO2, Arterial: 82.3 mmHg (ref 80.0–100.0)
pO2, Arterial: 55.2 mmHg — ABNORMAL LOW (ref 80.0–100.0)
pO2, Arterial: 62.9 mmHg — ABNORMAL LOW (ref 80.0–100.0)

## 2010-06-10 LAB — GLUCOSE, CAPILLARY
Glucose-Capillary: 210 mg/dL — ABNORMAL HIGH (ref 70–99)
Glucose-Capillary: 235 mg/dL — ABNORMAL HIGH (ref 70–99)
Glucose-Capillary: 265 mg/dL — ABNORMAL HIGH (ref 70–99)
Glucose-Capillary: 279 mg/dL — ABNORMAL HIGH (ref 70–99)
Glucose-Capillary: 304 mg/dL — ABNORMAL HIGH (ref 70–99)
Glucose-Capillary: 308 mg/dL — ABNORMAL HIGH (ref 70–99)
Glucose-Capillary: 332 mg/dL — ABNORMAL HIGH (ref 70–99)
Glucose-Capillary: 365 mg/dL — ABNORMAL HIGH (ref 70–99)
Glucose-Capillary: 374 mg/dL — ABNORMAL HIGH (ref 70–99)
Glucose-Capillary: 91 mg/dL (ref 70–99)
Glucose-Capillary: 106 mg/dL — ABNORMAL HIGH (ref 70–99)
Glucose-Capillary: 113 mg/dL — ABNORMAL HIGH (ref 70–99)
Glucose-Capillary: 119 mg/dL — ABNORMAL HIGH (ref 70–99)
Glucose-Capillary: 120 mg/dL — ABNORMAL HIGH (ref 70–99)
Glucose-Capillary: 121 mg/dL — ABNORMAL HIGH (ref 70–99)
Glucose-Capillary: 122 mg/dL — ABNORMAL HIGH (ref 70–99)
Glucose-Capillary: 124 mg/dL — ABNORMAL HIGH (ref 70–99)
Glucose-Capillary: 125 mg/dL — ABNORMAL HIGH (ref 70–99)
Glucose-Capillary: 129 mg/dL — ABNORMAL HIGH (ref 70–99)
Glucose-Capillary: 132 mg/dL — ABNORMAL HIGH (ref 70–99)
Glucose-Capillary: 133 mg/dL — ABNORMAL HIGH (ref 70–99)
Glucose-Capillary: 135 mg/dL — ABNORMAL HIGH (ref 70–99)
Glucose-Capillary: 136 mg/dL — ABNORMAL HIGH (ref 70–99)
Glucose-Capillary: 136 mg/dL — ABNORMAL HIGH (ref 70–99)
Glucose-Capillary: 137 mg/dL — ABNORMAL HIGH (ref 70–99)
Glucose-Capillary: 137 mg/dL — ABNORMAL HIGH (ref 70–99)
Glucose-Capillary: 140 mg/dL — ABNORMAL HIGH (ref 70–99)
Glucose-Capillary: 140 mg/dL — ABNORMAL HIGH (ref 70–99)
Glucose-Capillary: 142 mg/dL — ABNORMAL HIGH (ref 70–99)
Glucose-Capillary: 144 mg/dL — ABNORMAL HIGH (ref 70–99)
Glucose-Capillary: 144 mg/dL — ABNORMAL HIGH (ref 70–99)
Glucose-Capillary: 145 mg/dL — ABNORMAL HIGH (ref 70–99)
Glucose-Capillary: 145 mg/dL — ABNORMAL HIGH (ref 70–99)
Glucose-Capillary: 145 mg/dL — ABNORMAL HIGH (ref 70–99)
Glucose-Capillary: 146 mg/dL — ABNORMAL HIGH (ref 70–99)
Glucose-Capillary: 147 mg/dL — ABNORMAL HIGH (ref 70–99)
Glucose-Capillary: 147 mg/dL — ABNORMAL HIGH (ref 70–99)
Glucose-Capillary: 148 mg/dL — ABNORMAL HIGH (ref 70–99)
Glucose-Capillary: 149 mg/dL — ABNORMAL HIGH (ref 70–99)
Glucose-Capillary: 152 mg/dL — ABNORMAL HIGH (ref 70–99)
Glucose-Capillary: 153 mg/dL — ABNORMAL HIGH (ref 70–99)
Glucose-Capillary: 156 mg/dL — ABNORMAL HIGH (ref 70–99)
Glucose-Capillary: 158 mg/dL — ABNORMAL HIGH (ref 70–99)
Glucose-Capillary: 160 mg/dL — ABNORMAL HIGH (ref 70–99)
Glucose-Capillary: 161 mg/dL — ABNORMAL HIGH (ref 70–99)
Glucose-Capillary: 166 mg/dL — ABNORMAL HIGH (ref 70–99)
Glucose-Capillary: 170 mg/dL — ABNORMAL HIGH (ref 70–99)
Glucose-Capillary: 171 mg/dL — ABNORMAL HIGH (ref 70–99)
Glucose-Capillary: 176 mg/dL — ABNORMAL HIGH (ref 70–99)
Glucose-Capillary: 177 mg/dL — ABNORMAL HIGH (ref 70–99)
Glucose-Capillary: 178 mg/dL — ABNORMAL HIGH (ref 70–99)
Glucose-Capillary: 179 mg/dL — ABNORMAL HIGH (ref 70–99)
Glucose-Capillary: 182 mg/dL — ABNORMAL HIGH (ref 70–99)
Glucose-Capillary: 184 mg/dL — ABNORMAL HIGH (ref 70–99)
Glucose-Capillary: 193 mg/dL — ABNORMAL HIGH (ref 70–99)
Glucose-Capillary: 193 mg/dL — ABNORMAL HIGH (ref 70–99)

## 2010-06-10 LAB — CSF CELL COUNT WITH DIFFERENTIAL
RBC Count, CSF: 4250 /mm3 — ABNORMAL HIGH
Tube #: 1
WBC, CSF: 2 /mm3 (ref 0–5)

## 2010-06-10 LAB — CULTURE, BLOOD (ROUTINE X 2)
Culture  Setup Time: 201110180220
Culture  Setup Time: 201110180807
Culture  Setup Time: 201110180807
Culture: NO GROWTH
Culture: NO GROWTH

## 2010-06-10 LAB — BASIC METABOLIC PANEL
BUN: 13 mg/dL (ref 6–23)
BUN: 23 mg/dL (ref 6–23)
BUN: 38 mg/dL — ABNORMAL HIGH (ref 6–23)
BUN: 38 mg/dL — ABNORMAL HIGH (ref 6–23)
BUN: 46 mg/dL — ABNORMAL HIGH (ref 6–23)
BUN: 51 mg/dL — ABNORMAL HIGH (ref 6–23)
BUN: 63 mg/dL — ABNORMAL HIGH (ref 6–23)
BUN: 77 mg/dL — ABNORMAL HIGH (ref 6–23)
CO2: 22 mEq/L (ref 19–32)
CO2: 23 mEq/L (ref 19–32)
CO2: 26 mEq/L (ref 19–32)
CO2: 26 mEq/L (ref 19–32)
CO2: 22 mEq/L (ref 19–32)
CO2: 23 mEq/L (ref 19–32)
CO2: 25 mEq/L (ref 19–32)
CO2: 26 mEq/L (ref 19–32)
CO2: 26 mEq/L (ref 19–32)
CO2: 29 mEq/L (ref 19–32)
Calcium: 7.9 mg/dL — ABNORMAL LOW (ref 8.4–10.5)
Calcium: 8.2 mg/dL — ABNORMAL LOW (ref 8.4–10.5)
Calcium: 8.5 mg/dL (ref 8.4–10.5)
Calcium: 8.6 mg/dL (ref 8.4–10.5)
Calcium: 9 mg/dL (ref 8.4–10.5)
Calcium: 8.6 mg/dL (ref 8.4–10.5)
Calcium: 8.8 mg/dL (ref 8.4–10.5)
Calcium: 9 mg/dL (ref 8.4–10.5)
Calcium: 9.1 mg/dL (ref 8.4–10.5)
Calcium: 9.1 mg/dL (ref 8.4–10.5)
Chloride: 115 mEq/L — ABNORMAL HIGH (ref 96–112)
Chloride: 116 mEq/L — ABNORMAL HIGH (ref 96–112)
Chloride: 117 mEq/L — ABNORMAL HIGH (ref 96–112)
Chloride: 117 mEq/L — ABNORMAL HIGH (ref 96–112)
Chloride: 118 mEq/L — ABNORMAL HIGH (ref 96–112)
Chloride: 118 mEq/L — ABNORMAL HIGH (ref 96–112)
Chloride: 119 mEq/L — ABNORMAL HIGH (ref 96–112)
Creatinine, Ser: 1.32 mg/dL (ref 0.4–1.5)
Creatinine, Ser: 1.61 mg/dL — ABNORMAL HIGH (ref 0.4–1.5)
Creatinine, Ser: 1.67 mg/dL — ABNORMAL HIGH (ref 0.4–1.5)
Creatinine, Ser: 1.68 mg/dL — ABNORMAL HIGH (ref 0.4–1.5)
Creatinine, Ser: 1.76 mg/dL — ABNORMAL HIGH (ref 0.4–1.5)
Creatinine, Ser: 1.8 mg/dL — ABNORMAL HIGH (ref 0.4–1.5)
Creatinine, Ser: 1.82 mg/dL — ABNORMAL HIGH (ref 0.4–1.5)
Creatinine, Ser: 1.87 mg/dL — ABNORMAL HIGH (ref 0.4–1.5)
GFR calc Af Amer: 51 mL/min — ABNORMAL LOW (ref >60)
GFR calc Af Amer: 55 mL/min — ABNORMAL LOW (ref >60)
GFR calc Af Amer: 46 mL/min — ABNORMAL LOW (ref >60)
GFR calc Af Amer: 47 mL/min — ABNORMAL LOW (ref >60)
GFR calc Af Amer: 47 mL/min — ABNORMAL LOW (ref >60)
GFR calc Af Amer: 48 mL/min — ABNORMAL LOW (ref >60)
GFR calc Af Amer: 49 mL/min — ABNORMAL LOW (ref >60)
GFR calc Af Amer: 50 mL/min — ABNORMAL LOW (ref >60)
GFR calc non Af Amer: 42 mL/min — ABNORMAL LOW (ref >60)
GFR calc non Af Amer: 44 mL/min — ABNORMAL LOW (ref >60)
GFR calc non Af Amer: 58 mL/min — ABNORMAL LOW (ref >60)
GFR calc non Af Amer: 38 mL/min — ABNORMAL LOW (ref >60)
GFR calc non Af Amer: 39 mL/min — ABNORMAL LOW (ref >60)
Glucose, Bld: 103 mg/dL — ABNORMAL HIGH (ref 70–99)
Glucose, Bld: 123 mg/dL — ABNORMAL HIGH (ref 70–99)
Glucose, Bld: 237 mg/dL — ABNORMAL HIGH (ref 70–99)
Glucose, Bld: 264 mg/dL — ABNORMAL HIGH (ref 70–99)
Glucose, Bld: 123 mg/dL — ABNORMAL HIGH (ref 70–99)
Glucose, Bld: 154 mg/dL — ABNORMAL HIGH (ref 70–99)
Glucose, Bld: 163 mg/dL — ABNORMAL HIGH (ref 70–99)
Potassium: 3.2 mEq/L — ABNORMAL LOW (ref 3.5–5.1)
Potassium: 3.2 mEq/L — ABNORMAL LOW (ref 3.5–5.1)
Potassium: 3.4 mEq/L — ABNORMAL LOW (ref 3.5–5.1)
Potassium: 3.9 mEq/L (ref 3.5–5.1)
Potassium: 4.1 mEq/L (ref 3.5–5.1)
Potassium: 4.5 mEq/L (ref 3.5–5.1)
Potassium: 4.7 mEq/L (ref 3.5–5.1)
Potassium: 4.8 mEq/L (ref 3.5–5.1)
Sodium: 143 mEq/L (ref 135–145)
Sodium: 143 mEq/L (ref 135–145)
Sodium: 146 mEq/L — ABNORMAL HIGH (ref 135–145)
Sodium: 144 mEq/L (ref 135–145)
Sodium: 144 mEq/L (ref 135–145)
Sodium: 144 mEq/L (ref 135–145)
Sodium: 145 mEq/L (ref 135–145)
Sodium: 147 mEq/L — ABNORMAL HIGH (ref 135–145)
Sodium: 151 mEq/L — ABNORMAL HIGH (ref 135–145)

## 2010-06-10 LAB — CBC
HCT: 29 % — ABNORMAL LOW (ref 39.0–52.0)
HCT: 29.3 % — ABNORMAL LOW (ref 39.0–52.0)
HCT: 24.9 % — ABNORMAL LOW (ref 39.0–52.0)
HCT: 27.4 % — ABNORMAL LOW (ref 39.0–52.0)
HCT: 27.6 % — ABNORMAL LOW (ref 39.0–52.0)
HCT: 28.1 % — ABNORMAL LOW (ref 39.0–52.0)
HCT: 30.6 % — ABNORMAL LOW (ref 39.0–52.0)
Hemoglobin: 10.1 g/dL — ABNORMAL LOW (ref 13.0–17.0)
Hemoglobin: 9.7 g/dL — ABNORMAL LOW (ref 13.0–17.0)
Hemoglobin: 10.2 g/dL — ABNORMAL LOW (ref 13.0–17.0)
Hemoglobin: 8.4 g/dL — ABNORMAL LOW (ref 13.0–17.0)
Hemoglobin: 9.3 g/dL — ABNORMAL LOW (ref 13.0–17.0)
Hemoglobin: 9.4 g/dL — ABNORMAL LOW (ref 13.0–17.0)
MCH: 33.6 pg (ref 26.0–34.0)
MCH: 33.8 pg (ref 26.0–34.0)
MCH: 33.9 pg (ref 26.0–34.0)
MCH: 34 pg (ref 26.0–34.0)
MCH: 33.7 pg (ref 26.0–34.0)
MCH: 33.8 pg (ref 26.0–34.0)
MCH: 33.8 pg (ref 26.0–34.0)
MCH: 33.9 pg (ref 26.0–34.0)
MCH: 34 pg (ref 26.0–34.0)
MCHC: 33.2 g/dL (ref 30.0–36.0)
MCHC: 33.3 g/dL (ref 30.0–36.0)
MCHC: 33.4 g/dL (ref 30.0–36.0)
MCHC: 33.6 g/dL (ref 30.0–36.0)
MCHC: 33.6 g/dL (ref 30.0–36.0)
MCHC: 33.4 g/dL (ref 30.0–36.0)
MCHC: 33.4 g/dL (ref 30.0–36.0)
MCV: 101.3 fL — ABNORMAL HIGH (ref 78.0–100.0)
MCV: 101.7 fL — ABNORMAL HIGH (ref 78.0–100.0)
MCV: 100.9 fL — ABNORMAL HIGH (ref 78.0–100.0)
MCV: 101.1 fL — ABNORMAL HIGH (ref 78.0–100.0)
MCV: 101.8 fL — ABNORMAL HIGH (ref 78.0–100.0)
MCV: 101.8 fL — ABNORMAL HIGH (ref 78.0–100.0)
MCV: 102.2 fL — ABNORMAL HIGH (ref 78.0–100.0)
Platelets: 122 10*3/uL — ABNORMAL LOW (ref 150–400)
Platelets: 137 10*3/uL — ABNORMAL LOW (ref 150–400)
Platelets: 149 10*3/uL — ABNORMAL LOW (ref 150–400)
Platelets: 149 10*3/uL — ABNORMAL LOW (ref 150–400)
Platelets: 157 10*3/uL (ref 150–400)
Platelets: 151 10*3/uL (ref 150–400)
Platelets: 160 10*3/uL (ref 150–400)
Platelets: 166 10*3/uL (ref 150–400)
RBC: 2.76 MIL/uL — ABNORMAL LOW (ref 4.22–5.81)
RBC: 2.44 MIL/uL — ABNORMAL LOW (ref 4.22–5.81)
RBC: 2.46 MIL/uL — ABNORMAL LOW (ref 4.22–5.81)
RBC: 2.59 MIL/uL — ABNORMAL LOW (ref 4.22–5.81)
RBC: 2.7 MIL/uL — ABNORMAL LOW (ref 4.22–5.81)
RBC: 2.76 MIL/uL — ABNORMAL LOW (ref 4.22–5.81)
RDW: 14.9 % (ref 11.5–15.5)
RDW: 15 % (ref 11.5–15.5)
RDW: 15.5 % (ref 11.5–15.5)
RDW: 15.5 % (ref 11.5–15.5)
RDW: 15.6 % — ABNORMAL HIGH (ref 11.5–15.5)
RDW: 15.6 % — ABNORMAL HIGH (ref 11.5–15.5)
RDW: 15.4 % (ref 11.5–15.5)
RDW: 15.5 % (ref 11.5–15.5)
RDW: 15.8 % — ABNORMAL HIGH (ref 11.5–15.5)
WBC: 6.1 10*3/uL (ref 4.0–10.5)
WBC: 6.4 10*3/uL (ref 4.0–10.5)
WBC: 3.8 10*3/uL — ABNORMAL LOW (ref 4.0–10.5)
WBC: 5.4 10*3/uL (ref 4.0–10.5)
WBC: 5.5 10*3/uL (ref 4.0–10.5)
WBC: 6.8 10*3/uL (ref 4.0–10.5)

## 2010-06-10 LAB — URINALYSIS, ROUTINE W REFLEX MICROSCOPIC
Bilirubin Urine: NEGATIVE
Hgb urine dipstick: NEGATIVE
Ketones, ur: NEGATIVE mg/dL
Nitrite: NEGATIVE
Nitrite: NEGATIVE
Specific Gravity, Urine: 1.024 (ref 1.005–1.030)
Urobilinogen, UA: 1 mg/dL (ref 0.0–1.0)
Urobilinogen, UA: 0.2 mg/dL (ref 0.0–1.0)
pH: 6 (ref 5.0–8.0)

## 2010-06-10 LAB — FUNGUS CULTURE W SMEAR: Fungal Smear: NONE SEEN

## 2010-06-10 LAB — BODY FLUID CULTURE: Culture: NO GROWTH

## 2010-06-10 LAB — PHOSPHORUS
Phosphorus: 2.2 mg/dL — ABNORMAL LOW (ref 2.3–4.6)
Phosphorus: 3.6 mg/dL (ref 2.3–4.6)
Phosphorus: 5.4 mg/dL — ABNORMAL HIGH (ref 2.3–4.6)

## 2010-06-10 LAB — GLUCOSE, CSF: Glucose, CSF: 60 mg/dL (ref 43–76)

## 2010-06-10 LAB — CARDIAC PANEL(CRET KIN+CKTOT+MB+TROPI)
CK, MB: 0.8 ng/mL (ref 0.3–4.0)
CK, MB: 1 ng/mL (ref 0.3–4.0)
Total CK: 76 U/L (ref 7–232)
Total CK: 92 U/L (ref 7–232)
Troponin I: 0.01 ng/mL (ref 0.00–0.06)

## 2010-06-10 LAB — CSF CULTURE W GRAM STAIN: Culture: NO GROWTH

## 2010-06-10 LAB — VIRUS CULTURE: Preliminary Culture: NEGATIVE

## 2010-06-10 LAB — BRAIN NATRIURETIC PEPTIDE
Pro B Natriuretic peptide (BNP): 135 pg/mL — ABNORMAL HIGH (ref 0.0–100.0)
Pro B Natriuretic peptide (BNP): 37.8 pg/mL (ref 0.0–100.0)
Pro B Natriuretic peptide (BNP): 80.2 pg/mL (ref 0.0–100.0)
Pro B Natriuretic peptide (BNP): <30 pg/mL (ref 0.0–100.0)

## 2010-06-10 LAB — HEPATITIS PANEL, ACUTE
HCV Ab: REACTIVE — AB
Hep A IgM: NEGATIVE

## 2010-06-10 LAB — MAGNESIUM
Magnesium: 2.2 mg/dL (ref 1.5–2.5)
Magnesium: 3 mg/dL — ABNORMAL HIGH (ref 1.5–2.5)

## 2010-06-10 LAB — CLOSTRIDIUM DIFFICILE EIA: C difficile Toxins A+B, EIA: NEGATIVE

## 2010-06-10 LAB — COMPREHENSIVE METABOLIC PANEL
ALT: 9 U/L (ref 0–53)
Alkaline Phosphatase: 50 U/L (ref 39–117)
CO2: 25 mEq/L (ref 19–32)
CO2: 28 mEq/L (ref 19–32)
Calcium: 8.7 mg/dL (ref 8.4–10.5)
Creatinine, Ser: 0.95 mg/dL (ref 0.4–1.5)
GFR calc Af Amer: >60 mL/min (ref >60)
GFR calc non Af Amer: 49 mL/min — ABNORMAL LOW (ref >60)
GFR calc non Af Amer: >60 mL/min (ref >60)
Glucose, Bld: 124 mg/dL — ABNORMAL HIGH (ref 70–99)
Glucose, Bld: 87 mg/dL (ref 70–99)
Potassium: 4.3 mEq/L (ref 3.5–5.1)
Sodium: 145 mEq/L (ref 135–145)
Total Protein: 6.2 g/dL (ref 6.0–8.3)

## 2010-06-10 LAB — PATHOLOGIST SMEAR REVIEW

## 2010-06-10 LAB — DIFFERENTIAL
Basophils Absolute: 0.1 10*3/uL (ref 0.0–0.1)
Basophils Relative: 1 % (ref 0–1)
Lymphocytes Relative: 19 % (ref 12–46)
Lymphs Abs: 1.4 10*3/uL (ref 0.7–4.0)
Monocytes Relative: 11 % (ref 3–12)
Neutro Abs: 6.5 10*3/uL (ref 1.7–7.7)
Neutrophils Relative %: 67 % (ref 43–77)
Neutrophils Relative %: 72 % (ref 43–77)

## 2010-06-10 LAB — AFB CULTURE WITH SMEAR (NOT AT ARMC)

## 2010-06-10 LAB — URINE CULTURE
Culture  Setup Time: 201110180336
Special Requests: NEGATIVE

## 2010-06-10 LAB — CULTURE, BAL-QUANTITATIVE W GRAM STAIN: Colony Count: >=100000

## 2010-06-10 LAB — LACTIC ACID, PLASMA: Lactic Acid, Venous: 1.3 mmol/L (ref 0.5–2.2)

## 2010-06-10 LAB — PROCALCITONIN
Procalcitonin: 0.18 ng/mL
Procalcitonin: 0.25 ng/mL

## 2010-06-10 LAB — BODY FLUID CELL COUNT WITH DIFFERENTIAL: Lymphs, Fluid: 10 %

## 2010-06-10 LAB — IRON AND TIBC
Iron: 10 ug/dL — ABNORMAL LOW (ref 42–135)
TIBC: 193 ug/dL — ABNORMAL LOW (ref 215–435)

## 2010-06-10 LAB — PROTEIN, BODY FLUID

## 2010-06-10 LAB — AMMONIA: Ammonia: 54 umol/L — ABNORMAL HIGH (ref 11–35)

## 2010-06-10 LAB — GLUCOSE, SEROUS FLUID
Glucose, Fluid: 128 mg/dL
Glucose, Fluid: 130 mg/dL

## 2010-06-10 LAB — VALPROIC ACID LEVEL: Valproic Acid Lvl: 73.3 ug/mL (ref 50.0–100.0)

## 2010-06-10 LAB — VITAMIN B12: Vitamin B-12: 630 pg/mL (ref 211–911)

## 2010-06-10 LAB — RAPID URINE DRUG SCREEN, HOSP PERFORMED
Amphetamines: NOT DETECTED
Cocaine: NOT DETECTED
Tetrahydrocannabinol: NOT DETECTED

## 2010-06-10 LAB — TSH
TSH: 0.694 u[IU]/mL (ref 0.350–4.500)
TSH: 1.12 u[IU]/mL (ref 0.350–4.500)
TSH: 2.529 u[IU]/mL (ref 0.350–4.500)

## 2010-06-10 LAB — PH, BODY FLUID

## 2010-06-10 LAB — GRAM STAIN

## 2010-06-10 LAB — LACTATE DEHYDROGENASE, PLEURAL OR PERITONEAL FLUID: LD, Fluid: 180 U/L — ABNORMAL HIGH (ref 3–23)

## 2010-06-10 LAB — HERPES SIMPLEX VIRUS(HSV) DNA BY PCR

## 2010-06-10 LAB — CLOSTRIDIUM DIFFICILE BY PCR

## 2010-06-10 LAB — HIV ANTIBODY (ROUTINE TESTING W REFLEX): HIV: NONREACTIVE

## 2010-06-10 LAB — RETICULOCYTES: Retic Count, Absolute: 63 10*3/uL (ref 19.0–186.0)

## 2010-06-10 LAB — MRSA PCR SCREENING: MRSA by PCR: NEGATIVE

## 2010-06-10 LAB — PROTEIN, CSF: Total  Protein, CSF: 40 mg/dL (ref 15–45)

## 2010-06-11 ENCOUNTER — Inpatient Hospital Stay (HOSPITAL_COMMUNITY)
Admission: EM | Admit: 2010-06-11 | Discharge: 2010-06-25 | DRG: 393 | Disposition: A | Discharge status: Home Health | Payer: Medicaid Other | Attending: Emergency Medicine | Admitting: Emergency Medicine

## 2010-06-11 DIAGNOSIS — E8809 Other disorders of plasma-protein metabolism, not elsewhere classified: Secondary | ICD-10-CM | POA: Diagnosis present

## 2010-06-11 DIAGNOSIS — R404 Transient alteration of awareness: Secondary | ICD-10-CM | POA: Diagnosis present

## 2010-06-11 DIAGNOSIS — R609 Edema, unspecified: Secondary | ICD-10-CM | POA: Diagnosis present

## 2010-06-11 DIAGNOSIS — L8990 Pressure ulcer of unspecified site, unspecified stage: Secondary | ICD-10-CM | POA: Diagnosis present

## 2010-06-11 DIAGNOSIS — D61811 Other drug-induced pancytopenia: Secondary | ICD-10-CM | POA: Diagnosis present

## 2010-06-11 DIAGNOSIS — F259 Schizoaffective disorder, unspecified: Secondary | ICD-10-CM | POA: Diagnosis present

## 2010-06-11 DIAGNOSIS — B1921 Unspecified viral hepatitis C with hepatic coma: Secondary | ICD-10-CM | POA: Diagnosis present

## 2010-06-11 DIAGNOSIS — F29 Unspecified psychosis not due to a substance or known physiological condition: Secondary | ICD-10-CM | POA: Diagnosis present

## 2010-06-11 DIAGNOSIS — R5381 Other malaise: Secondary | ICD-10-CM | POA: Diagnosis not present

## 2010-06-11 DIAGNOSIS — L89899 Pressure ulcer of other site, unspecified stage: Secondary | ICD-10-CM | POA: Diagnosis present

## 2010-06-11 DIAGNOSIS — E039 Hypothyroidism, unspecified: Secondary | ICD-10-CM | POA: Diagnosis present

## 2010-06-11 DIAGNOSIS — K7460 Unspecified cirrhosis of liver: Secondary | ICD-10-CM | POA: Diagnosis present

## 2010-06-11 DIAGNOSIS — M109 Gout, unspecified: Secondary | ICD-10-CM | POA: Diagnosis present

## 2010-06-11 DIAGNOSIS — F3189 Other bipolar disorder: Secondary | ICD-10-CM | POA: Diagnosis present

## 2010-06-11 DIAGNOSIS — I10 Essential (primary) hypertension: Secondary | ICD-10-CM | POA: Diagnosis present

## 2010-06-11 DIAGNOSIS — K612 Anorectal abscess: Principal | ICD-10-CM | POA: Diagnosis present

## 2010-06-11 DIAGNOSIS — T43505A Adverse effect of unspecified antipsychotics and neuroleptics, initial encounter: Secondary | ICD-10-CM | POA: Diagnosis present

## 2010-06-11 LAB — COMPREHENSIVE METABOLIC PANEL
AST: 24 U/L (ref 0–37)
Albumin: 3.4 g/dL — ABNORMAL LOW (ref 3.5–5.2)
BUN: 15 mg/dL (ref 6–23)
Calcium: 9.8 mg/dL (ref 8.4–10.5)
Chloride: 108 mEq/L (ref 96–112)
Creatinine, Ser: 1.3 mg/dL (ref 0.4–1.5)
GFR calc Af Amer: >60 mL/min (ref >60)
Glucose, Bld: 108 mg/dL — ABNORMAL HIGH (ref 70–99)
Potassium: 3.6 mEq/L (ref 3.5–5.1)
Total Bilirubin: 1.2 mg/dL (ref 0.3–1.2)
Total Protein: 7.2 g/dL (ref 6.0–8.3)

## 2010-06-11 LAB — DIFFERENTIAL
Basophils Relative: 1 % (ref 0–1)
Lymphs Abs: 1.7 10*3/uL (ref 0.7–4.0)
Monocytes Relative: 9 % (ref 3–12)
Neutro Abs: 1.5 10*3/uL — ABNORMAL LOW (ref 1.7–7.7)
Neutrophils Relative %: 41 % — ABNORMAL LOW (ref 43–77)

## 2010-06-11 LAB — URINALYSIS, ROUTINE W REFLEX MICROSCOPIC
Glucose, UA: NEGATIVE mg/dL
Ketones, ur: NEGATIVE mg/dL
pH: 6.5 (ref 5.0–8.0)

## 2010-06-11 LAB — CBC
HCT: 38.3 % — ABNORMAL LOW (ref 39.0–52.0)
MCH: 30.7 pg (ref 26.0–34.0)
MCH: 33.8 pg (ref 26.0–34.0)
MCHC: 34 g/dL (ref 30.0–36.0)
MCV: 99.4 fL (ref 78.0–100.0)
Platelets: 159 10*3/uL (ref 150–400)
RBC: 4.43 MIL/uL (ref 4.22–5.81)
RDW: 15.9 % — ABNORMAL HIGH (ref 11.5–15.5)
WBC: 8.2 10*3/uL (ref 4.0–10.5)

## 2010-06-11 LAB — RAPID URINE DRUG SCREEN, HOSP PERFORMED
Amphetamines: NOT DETECTED
Barbiturates: NOT DETECTED
Barbiturates: NOT DETECTED
Benzodiazepines: POSITIVE — AB
Benzodiazepines: NOT DETECTED
Cocaine: NOT DETECTED
Opiates: POSITIVE — AB

## 2010-06-11 LAB — VALPROIC ACID LEVEL: Valproic Acid Lvl: 106.8 ug/mL — ABNORMAL HIGH (ref 50.0–100.0)

## 2010-06-12 ENCOUNTER — Inpatient Hospital Stay (HOSPITAL_COMMUNITY): Admission: AD | Admit: 2010-06-12 | Payer: Medicaid Other | Admitting: Psychiatry

## 2010-06-12 DIAGNOSIS — F259 Schizoaffective disorder, unspecified: Secondary | ICD-10-CM

## 2010-06-13 DIAGNOSIS — F259 Schizoaffective disorder, unspecified: Secondary | ICD-10-CM

## 2010-06-13 DIAGNOSIS — F319 Bipolar disorder, unspecified: Secondary | ICD-10-CM

## 2010-06-14 DIAGNOSIS — F259 Schizoaffective disorder, unspecified: Secondary | ICD-10-CM

## 2010-06-17 DIAGNOSIS — F259 Schizoaffective disorder, unspecified: Secondary | ICD-10-CM

## 2010-06-18 ENCOUNTER — Emergency Department (HOSPITAL_COMMUNITY): Payer: Medicaid Other

## 2010-06-18 LAB — LIPASE, BLOOD: Lipase: 38 U/L (ref 11–59)

## 2010-06-18 LAB — COMPREHENSIVE METABOLIC PANEL
BUN: 10 mg/dL (ref 6–23)
CO2: 24 mEq/L (ref 19–32)
Calcium: 9.2 mg/dL (ref 8.4–10.5)
Chloride: 110 mEq/L (ref 96–112)
Creatinine, Ser: 1.26 mg/dL (ref 0.4–1.5)
GFR calc Af Amer: >60 mL/min (ref >60)
GFR calc non Af Amer: >60 mL/min (ref >60)
Glucose, Bld: 130 mg/dL — ABNORMAL HIGH (ref 70–99)
Total Bilirubin: 0.6 mg/dL (ref 0.3–1.2)

## 2010-06-18 LAB — URINALYSIS, ROUTINE W REFLEX MICROSCOPIC
Nitrite: NEGATIVE
Protein, ur: NEGATIVE mg/dL
Specific Gravity, Urine: 1.014 (ref 1.005–1.030)
Urobilinogen, UA: 0.2 mg/dL (ref 0.0–1.0)

## 2010-06-18 LAB — CBC
Hemoglobin: 10.8 g/dL — ABNORMAL LOW (ref 13.0–17.0)
MCH: 30.6 pg (ref 26.0–34.0)
MCHC: 32.9 g/dL (ref 30.0–36.0)
MCV: 92.9 fL (ref 78.0–100.0)

## 2010-06-18 LAB — DIFFERENTIAL
Basophils Relative: 0 % (ref 0–1)
Lymphs Abs: 1.2 10*3/uL (ref 0.7–4.0)
Monocytes Absolute: 0.1 10*3/uL (ref 0.1–1.0)
Monocytes Relative: 5 % (ref 3–12)
Neutro Abs: 1.6 10*3/uL — ABNORMAL LOW (ref 1.7–7.7)

## 2010-06-20 DIAGNOSIS — F259 Schizoaffective disorder, unspecified: Secondary | ICD-10-CM

## 2010-06-20 LAB — GLUCOSE, CAPILLARY: Glucose-Capillary: 87 mg/dL (ref 70–99)

## 2010-06-20 LAB — BASIC METABOLIC PANEL
BUN: 11 mg/dL (ref 6–23)
Calcium: 9.2 mg/dL (ref 8.4–10.5)
GFR calc non Af Amer: 55 mL/min — ABNORMAL LOW (ref >60)
Glucose, Bld: 85 mg/dL (ref 70–99)
Potassium: 4.8 mEq/L (ref 3.5–5.1)
Sodium: 141 mEq/L (ref 135–145)

## 2010-06-20 LAB — HEPATIC FUNCTION PANEL
AST: 16 U/L (ref 0–37)
Albumin: 2.7 g/dL — ABNORMAL LOW (ref 3.5–5.2)
Bilirubin, Direct: 0.4 mg/dL — ABNORMAL HIGH (ref 0.0–0.3)
Total Bilirubin: 0.8 mg/dL (ref 0.3–1.2)

## 2010-06-20 LAB — CBC
HCT: 35.1 % — ABNORMAL LOW (ref 39.0–52.0)
MCHC: 32.5 g/dL (ref 30.0–36.0)
Platelets: 115 10*3/uL — ABNORMAL LOW (ref 150–400)
RDW: 15.9 % — ABNORMAL HIGH (ref 11.5–15.5)
WBC: 3 10*3/uL — ABNORMAL LOW (ref 4.0–10.5)

## 2010-06-20 LAB — VALPROIC ACID LEVEL: Valproic Acid Lvl: 122.8 ug/mL — ABNORMAL HIGH (ref 50.0–100.0)

## 2010-06-21 DIAGNOSIS — F259 Schizoaffective disorder, unspecified: Secondary | ICD-10-CM

## 2010-06-22 ENCOUNTER — Ambulatory Visit (HOSPITAL_COMMUNITY)
Admission: RE | Admit: 2010-06-22 | Discharge: 2010-06-22 | Disposition: A | Discharge status: Home / Self Care | Payer: Medicaid Other | Source: Ambulatory Visit

## 2010-06-22 ENCOUNTER — Ambulatory Visit (HOSPITAL_COMMUNITY): Admission: RE | Admit: 2010-06-22 | Payer: Medicaid Other | Source: Ambulatory Visit

## 2010-06-22 LAB — DIFFERENTIAL
Basophils Absolute: 0 10*3/uL (ref 0.0–0.1)
Basophils Relative: 0 % (ref 0–1)
Lymphocytes Relative: 35 % (ref 12–46)
Neutro Abs: 2 10*3/uL (ref 1.7–7.7)
Neutrophils Relative %: 54 % (ref 43–77)

## 2010-06-22 LAB — COMPREHENSIVE METABOLIC PANEL
ALT: 11 U/L (ref 0–53)
Alkaline Phosphatase: 55 U/L (ref 39–117)
CO2: 28 mEq/L (ref 19–32)
Chloride: 110 mEq/L (ref 96–112)
GFR calc non Af Amer: 53 mL/min — ABNORMAL LOW (ref >60)
Glucose, Bld: 104 mg/dL — ABNORMAL HIGH (ref 70–99)
Potassium: 3.8 mEq/L (ref 3.5–5.1)
Sodium: 143 mEq/L (ref 135–145)
Total Bilirubin: 0.9 mg/dL (ref 0.3–1.2)

## 2010-06-22 LAB — BRAIN NATRIURETIC PEPTIDE: Pro B Natriuretic peptide (BNP): <30 pg/mL (ref 0.0–100.0)

## 2010-06-22 LAB — CBC
HCT: 30.8 % — ABNORMAL LOW (ref 39.0–52.0)
Hemoglobin: 9.9 g/dL — ABNORMAL LOW (ref 13.0–17.0)
RBC: 3.25 MIL/uL — ABNORMAL LOW (ref 4.22–5.81)

## 2010-06-22 LAB — VALPROIC ACID LEVEL: Valproic Acid Lvl: 65.1 ug/mL (ref 50.0–100.0)

## 2010-06-23 ENCOUNTER — Emergency Department (HOSPITAL_COMMUNITY): Payer: Medicaid Other

## 2010-06-23 ENCOUNTER — Other Ambulatory Visit: Payer: Self-pay | Admitting: Internal Medicine

## 2010-06-23 DIAGNOSIS — M7989 Other specified soft tissue disorders: Secondary | ICD-10-CM

## 2010-06-23 DIAGNOSIS — D61818 Other pancytopenia: Secondary | ICD-10-CM

## 2010-06-23 MED ORDER — IOHEXOL 300 MG/ML  SOLN
100.0000 mL | Freq: Once | INTRAMUSCULAR | Status: AC | PRN
  Administered 2010-06-23: 100 mL via INTRAVENOUS

## 2010-06-24 LAB — AMMONIA: Ammonia: 82 umol/L — ABNORMAL HIGH (ref 11–35)

## 2010-06-24 LAB — BASIC METABOLIC PANEL
CO2: 28 mEq/L (ref 19–32)
Calcium: 9 mg/dL (ref 8.4–10.5)
Chloride: 107 mEq/L (ref 96–112)
GFR calc Af Amer: >60 mL/min (ref >60)
Sodium: 139 mEq/L (ref 135–145)

## 2010-06-24 LAB — HEPATITIS B CORE ANTIBODY, TOTAL: Hep B Core Total Ab: NEGATIVE

## 2010-06-24 LAB — HIV ANTIBODY (ROUTINE TESTING W REFLEX): HIV: NONREACTIVE

## 2010-06-24 LAB — CBC
Hemoglobin: 9.5 g/dL — ABNORMAL LOW (ref 13.0–17.0)
Platelets: 94 10*3/uL — ABNORMAL LOW (ref 150–400)
RBC: 3.15 MIL/uL — ABNORMAL LOW (ref 4.22–5.81)

## 2010-06-24 LAB — HEPATITIS C ANTIBODY: HCV Ab: REACTIVE — AB

## 2010-06-25 DIAGNOSIS — F259 Schizoaffective disorder, unspecified: Secondary | ICD-10-CM

## 2010-06-29 ENCOUNTER — Emergency Department (HOSPITAL_COMMUNITY)
Admission: EM | Admit: 2010-06-29 | Discharge: 2010-07-11 | Disposition: A | Discharge status: Other Acute Inpatient Hospital | Payer: Medicaid Other | Attending: Emergency Medicine | Admitting: Emergency Medicine

## 2010-06-29 DIAGNOSIS — Z91199 Patient's noncompliance with other medical treatment and regimen due to unspecified reason: Secondary | ICD-10-CM | POA: Insufficient documentation

## 2010-06-29 DIAGNOSIS — R161 Splenomegaly, not elsewhere classified: Secondary | ICD-10-CM | POA: Insufficient documentation

## 2010-06-29 DIAGNOSIS — R0602 Shortness of breath: Secondary | ICD-10-CM | POA: Insufficient documentation

## 2010-06-29 DIAGNOSIS — Z8619 Personal history of other infectious and parasitic diseases: Secondary | ICD-10-CM | POA: Insufficient documentation

## 2010-06-29 DIAGNOSIS — L299 Pruritus, unspecified: Secondary | ICD-10-CM | POA: Insufficient documentation

## 2010-06-29 DIAGNOSIS — Z9119 Patient's noncompliance with other medical treatment and regimen: Secondary | ICD-10-CM | POA: Insufficient documentation

## 2010-06-29 DIAGNOSIS — IMO0002 Reserved for concepts with insufficient information to code with codable children: Secondary | ICD-10-CM | POA: Insufficient documentation

## 2010-06-29 DIAGNOSIS — R21 Rash and other nonspecific skin eruption: Secondary | ICD-10-CM | POA: Insufficient documentation

## 2010-06-29 DIAGNOSIS — R799 Abnormal finding of blood chemistry, unspecified: Secondary | ICD-10-CM | POA: Insufficient documentation

## 2010-06-29 DIAGNOSIS — M7989 Other specified soft tissue disorders: Secondary | ICD-10-CM

## 2010-06-29 DIAGNOSIS — I10 Essential (primary) hypertension: Secondary | ICD-10-CM | POA: Insufficient documentation

## 2010-06-29 DIAGNOSIS — R188 Other ascites: Secondary | ICD-10-CM | POA: Insufficient documentation

## 2010-06-29 DIAGNOSIS — R609 Edema, unspecified: Secondary | ICD-10-CM | POA: Insufficient documentation

## 2010-06-29 DIAGNOSIS — E039 Hypothyroidism, unspecified: Secondary | ICD-10-CM | POA: Insufficient documentation

## 2010-06-29 DIAGNOSIS — Z046 Encounter for general psychiatric examination, requested by authority: Secondary | ICD-10-CM | POA: Insufficient documentation

## 2010-06-29 DIAGNOSIS — F29 Unspecified psychosis not due to a substance or known physiological condition: Secondary | ICD-10-CM | POA: Insufficient documentation

## 2010-06-29 LAB — COMPREHENSIVE METABOLIC PANEL
ALT: 14 U/L (ref 0–53)
AST: 21 U/L (ref 0–37)
Albumin: 2.8 g/dL — ABNORMAL LOW (ref 3.5–5.2)
Alkaline Phosphatase: 56 U/L (ref 39–117)
Calcium: 9.2 mg/dL (ref 8.4–10.5)
GFR calc Af Amer: 57 mL/min — ABNORMAL LOW (ref >60)
Glucose, Bld: 74 mg/dL (ref 70–99)
Potassium: 3.6 mEq/L (ref 3.5–5.1)
Sodium: 143 mEq/L (ref 135–145)
Total Protein: 5.3 g/dL — ABNORMAL LOW (ref 6.0–8.3)

## 2010-06-29 LAB — DIFFERENTIAL
Basophils Absolute: 0.1 10*3/uL (ref 0.0–0.1)
Eosinophils Absolute: 0.1 10*3/uL (ref 0.0–0.7)
Lymphocytes Relative: 29 % (ref 12–46)
Lymphs Abs: 1.4 10*3/uL (ref 0.7–4.0)
Neutrophils Relative %: 51 % (ref 43–77)

## 2010-06-29 LAB — CBC
MCV: 94.1 fL (ref 78.0–100.0)
Platelets: 130 10*3/uL — ABNORMAL LOW (ref 150–400)
RBC: 3.57 MIL/uL — ABNORMAL LOW (ref 4.22–5.81)
RDW: 17.2 % — ABNORMAL HIGH (ref 11.5–15.5)
WBC: 4.8 10*3/uL (ref 4.0–10.5)

## 2010-06-29 LAB — D-DIMER, QUANTITATIVE: D-Dimer, Quant: 2.16 ug/mL-FEU — ABNORMAL HIGH (ref 0.00–0.48)

## 2010-06-29 LAB — HEPATITIS C VRS RNA DETECT BY PCR-QUAL: Hepatitis C Vrs RNA by PCR-Qual: POSITIVE — AB

## 2010-06-29 LAB — VALPROIC ACID LEVEL: Valproic Acid Lvl: 13.1 ug/mL — ABNORMAL LOW (ref 50.0–100.0)

## 2010-06-29 NOTE — Discharge Summary (Signed)
[name], [name] NO.:  [id number]  MEDICAL RECORD NO.:  [id number]           PATIENT TYPE:  I  LOCATION:  1518                         FACILITY:  [facility]  PHYSICIAN:  Hollice Espy, M.D.DATE OF BIRTH:  11/18/1960  DATE OF ADMISSION:  06/11/2010 DATE OF DISCHARGE:  06/25/2010                              DISCHARGE SUMMARY   PRIMARY CARE PHYSICIAN:  Dr. Leone Haven.  CONSULTANTS:  Rose Phi. Myna Hidalgo, M.D., Hematology; Marlis Edelson, DO, and Eulogio Ditch, MD, Psychiatry.  DISCHARGE DIAGNOSES: 1. Schizoaffective disorder bipolar type with acute delirium and acute     psychosis, the acute issues have now been resolved. 2. Mild pancytopenia, now resolved. 3. Hepatitis C with positive initial hep C antibody.  Qualitative PCR     is pending. 4. Secondary cirrhosis. 5. Brief hepatic encephalopathy, now resolved. 6. History of gout. 7. History of hypothyroidism. 8. Perineal cellulitis. 9. Right big toe decubitus ulcer. 10.Bilateral lower extremity edema secondary to hypoalbuminemia. 11.Deconditioning. 12.History of hypertension.  DISCHARGE MEDICATIONS:  For the patient as follows, 1. Klonopin 1 mg p.o. daily. 2. Depakote 1000 mg p.o. q.h.s. 3. Doxycycline 100 mg p.o. b.i.d. 4. Neurontin 600 p.o. t.i.d. 5. Lactulose 30 cc p.o. t.i.d. 6. OxyIR 5 mg p.o. q.4 h. p.r.n. 7. Folate 1 mg p.o. daily. 8. Synthroid 25 mcg p.o. daily. 9. Multivitamin p.o. daily. 10.Artane 5 mg p.o. daily.  The patient had previously been on Geodon     and Zoloft, both these medications have been held.  HOSPITAL COURSE:  The patient is a 50 year old white male, past medical history of schizoaffective disorder as well as hep C, who had come in initially for hallucinations and psychosis and the plan was for the patient to go to [facility].  He had actually been there for almost 11 days awaiting placement when he was noted that he started becoming  pancytopenic and had spontaneously draining perineal abscess and becoming deconditioned.  ER physician then contacted the hospitalist on March 26 and the patient was admitted to the hospitalist service.  From a medical standpoint, the patient received wound care and started on doxycycline, which he tolerated well.  He had thin strips of calcium alginate 0.25 inch into the wound, approximately 3 cm, and was changed daily.  He also noted to have a left great toe ulcer, which was a strip of hydrocolloid dressing, was down around the toe and that was changed every 5 days.  The patient tolerated this well.  In regards to his deconditioning, he was evaluated and the plan will be for Home Health PT/OT to follow him.  From a pancytopenic standpoint, hematology was consulted.  Hematology felt that this was mild and this was felt to be secondary to the Risperdal.  Risperdal was held and his white blood cell count improved, initially had been down to 3 and since that time had improved, which had previously been on admission at 8.2.  By March 28, it was already at 3.7 and improving.  After discussion with Dr. Rogers Blocker and Psychiatry will be continuing to follow the patient.  The patient's  mentation was improving.  He was felt to no longer be psychotic and was cleared from a psychiatry standpoint, not felt to be committable.  Dr. Rogers Blocker recommended changing his medications, increasing his Klonopin, increasing his Depakote, and discontinuing his Geodon and restarting Risperdal.  When I spoke with Dr. Rogers Blocker prior to the patient's discharge about the Risperdal causing pancytopenia, Dr. Rogers Blocker will plan to follow up with the patient and recommend a new antipsychotic.  In regards to his hepatitis C and secondary cirrhosis, the patient was noted to have some ascites, elevated ammonia levels initially at 40, but then came up to 80.  He was started on lactulose, which he tolerated well and ammonia  level since then had much improved. His hepatitis panel was ordered, which came back positive for hepatitis C and qualitative antibody has been sent and is pending.  Long-term plans would be for the following, to discharge the patient to home, was now medically clear and felt to be safe.  Initially, SNF was recommended by PT, but however, the patient's mother who helps care for the patient declined this and said that he would be fine at home with Home Health. Plan we will get home health, wound care, PT, OT, and an RN to follow up with the patient at home.  He will follow up with Dr. Leone Haven, his PCP, as well as outpatient Psychiatry in the next coming weeks.     Hollice Espy, M.D.     SKK/MEDQ  D:  06/25/2010  T:  06/26/2010  Job:  [id number]  cc:   Leone Haven Fax: [phone]  Electronically Signed by Virginia Rochester M.D. on 06/29/2010 02:37:40 PM

## 2010-06-29 NOTE — Discharge Summary (Signed)
  NAMEFARREN, NELLES NO.:  [id number]  MEDICAL RECORD NO.:  [id number]           PATIENT TYPE:  I  LOCATION:  1518                         [facility]ACILITY:  [facility]  PHYSICIAN:  Hollice Espy, M.D.DATE OF BIRTH:  1960/12/17  DATE OF ADMISSION:  06/11/2010 DATE OF DISCHARGE:  06/25/2010                              DISCHARGE SUMMARY   ADDENDUM:  The patient was followed up by Psychiatry.  Dr. Rogers Blocker saw the patient and after evaluation of the medications and his course he felt comfortable restarting the patient on Risperdal.  The plan will be for the patient to go home on Risperdal 6 mg p.o. q.h.s. in addition to the other medications the patient is on with plans for a followup CBC next week.  The patient is being referred to [facility] and at that point his blood counts can be followed as well.  In the meantime, the plan will be to restart the patient on Risperdal at this time.  His other medical issues are stable.  Please add Risperdal 6 mg p.o. q.h.s. to discharge medication list.     Hollice Espy, M.D.     SKK/MEDQ  D:  06/25/2010  T:  06/26/2010  Job:  [id number]  Electronically Signed by Virginia Rochester M.D. on 06/29/2010 02:37:42 PM

## 2010-06-30 DIAGNOSIS — F259 Schizoaffective disorder, unspecified: Secondary | ICD-10-CM

## 2010-06-30 LAB — RAPID URINE DRUG SCREEN, HOSP PERFORMED
Benzodiazepines: POSITIVE — AB
Cocaine: NOT DETECTED
Tetrahydrocannabinol: NOT DETECTED

## 2010-07-01 DIAGNOSIS — F259 Schizoaffective disorder, unspecified: Secondary | ICD-10-CM

## 2010-07-02 DIAGNOSIS — F259 Schizoaffective disorder, unspecified: Secondary | ICD-10-CM

## 2010-07-03 DIAGNOSIS — F259 Schizoaffective disorder, unspecified: Secondary | ICD-10-CM

## 2010-07-06 DIAGNOSIS — M7989 Other specified soft tissue disorders: Secondary | ICD-10-CM

## 2010-07-06 LAB — CBC
Hemoglobin: 9.7 g/dL — ABNORMAL LOW (ref 13.0–17.0)
Platelets: 143 10*3/uL — ABNORMAL LOW (ref 150–400)
RBC: 3.19 MIL/uL — ABNORMAL LOW (ref 4.22–5.81)
WBC: 4.1 10*3/uL (ref 4.0–10.5)

## 2010-07-06 LAB — DIFFERENTIAL
Basophils Relative: 2 % — ABNORMAL HIGH (ref 0–1)
Eosinophils Absolute: 0.2 10*3/uL (ref 0.0–0.7)
Lymphs Abs: 1.5 10*3/uL (ref 0.7–4.0)
Monocytes Relative: 8 % (ref 3–12)
Neutro Abs: 2.1 10*3/uL (ref 1.7–7.7)
Neutrophils Relative %: 50 % (ref 43–77)

## 2010-07-06 LAB — COMPREHENSIVE METABOLIC PANEL
ALT: 13 U/L (ref 0–53)
AST: 25 U/L (ref 0–37)
AST: 26 U/L (ref 0–37)
Albumin: 2.7 g/dL — ABNORMAL LOW (ref 3.5–5.2)
Alkaline Phosphatase: 60 U/L (ref 39–117)
BUN: 21 mg/dL (ref 6–23)
CO2: 27 mEq/L (ref 19–32)
CO2: 28 mEq/L (ref 19–32)
Chloride: 112 mEq/L (ref 96–112)
Chloride: 110 mEq/L (ref 96–112)
Creatinine, Ser: 1.58 mg/dL — ABNORMAL HIGH (ref 0.4–1.5)
GFR calc Af Amer: 56 mL/min — ABNORMAL LOW (ref >60)
GFR calc non Af Amer: 46 mL/min — ABNORMAL LOW (ref >60)
GFR calc non Af Amer: 47 mL/min — ABNORMAL LOW (ref >60)
Glucose, Bld: 134 mg/dL — ABNORMAL HIGH (ref 70–99)
Potassium: 3.9 mEq/L (ref 3.5–5.1)
Total Bilirubin: 0.8 mg/dL (ref 0.3–1.2)
Total Bilirubin: 0.9 mg/dL (ref 0.3–1.2)

## 2010-07-06 LAB — BRAIN NATRIURETIC PEPTIDE: Pro B Natriuretic peptide (BNP): 38.3 pg/mL (ref 0.0–100.0)

## 2010-07-07 ENCOUNTER — Emergency Department (HOSPITAL_COMMUNITY): Payer: Medicaid Other

## 2010-07-07 DIAGNOSIS — F259 Schizoaffective disorder, unspecified: Secondary | ICD-10-CM

## 2010-07-07 LAB — URINALYSIS, ROUTINE W REFLEX MICROSCOPIC
Hgb urine dipstick: NEGATIVE
Nitrite: NEGATIVE
Protein, ur: NEGATIVE mg/dL
Urobilinogen, UA: 0.2 mg/dL (ref 0.0–1.0)

## 2010-07-07 LAB — COMPREHENSIVE METABOLIC PANEL
AST: 27 U/L (ref 0–37)
Albumin: 3.2 g/dL — ABNORMAL LOW (ref 3.5–5.2)
BUN: 21 mg/dL (ref 6–23)
CO2: 30 mEq/L (ref 19–32)
Calcium: 9.3 mg/dL (ref 8.4–10.5)
Chloride: 105 mEq/L (ref 96–112)
Creatinine, Ser: 1.37 mg/dL (ref 0.4–1.5)
Creatinine, Ser: 1.15 mg/dL (ref 0.4–1.5)
GFR calc Af Amer: >60 mL/min (ref >60)
GFR calc non Af Amer: 55 mL/min — ABNORMAL LOW (ref >60)
GFR calc non Af Amer: >60 mL/min (ref >60)
Total Bilirubin: 0.8 mg/dL (ref 0.3–1.2)

## 2010-07-07 LAB — DIFFERENTIAL
Eosinophils Absolute: 0.2 10*3/uL (ref 0.0–0.7)
Lymphocytes Relative: 24 % (ref 12–46)
Lymphs Abs: 1.2 10*3/uL (ref 0.7–4.0)
Monocytes Relative: 8 % (ref 3–12)
Neutrophils Relative %: 65 % (ref 43–77)

## 2010-07-07 LAB — CBC
HCT: 37.9 % — ABNORMAL LOW (ref 39.0–52.0)
HCT: 38.7 % — ABNORMAL LOW (ref 39.0–52.0)
MCHC: 34.9 g/dL (ref 30.0–36.0)
MCV: 98.3 fL (ref 78.0–100.0)
MCV: 97.9 fL (ref 78.0–100.0)
MCV: 98.5 fL (ref 78.0–100.0)
Platelets: 159 10*3/uL (ref 150–400)
Platelets: 100 10*3/uL — ABNORMAL LOW (ref 150–400)
RBC: 3.86 MIL/uL — ABNORMAL LOW (ref 4.22–5.81)
RBC: 3.93 MIL/uL — ABNORMAL LOW (ref 4.22–5.81)
WBC: 5.9 10*3/uL (ref 4.0–10.5)
WBC: 5 10*3/uL (ref 4.0–10.5)

## 2010-07-07 LAB — TSH: TSH: 0.96 u[IU]/mL (ref 0.350–4.500)

## 2010-07-07 LAB — AMMONIA: Ammonia: 28 umol/L (ref 11–35)

## 2010-07-07 LAB — CARDIAC PANEL(CRET KIN+CKTOT+MB+TROPI): Troponin I: 0.01 ng/mL (ref 0.00–0.06)

## 2010-07-07 LAB — BRAIN NATRIURETIC PEPTIDE: Pro B Natriuretic peptide (BNP): <30 pg/mL (ref 0.0–100.0)

## 2010-07-07 MED ORDER — IOHEXOL 300 MG/ML  SOLN
100.0000 mL | Freq: Once | INTRAMUSCULAR | Status: AC | PRN
  Administered 2010-07-07: 100 mL via INTRAVENOUS

## 2010-07-09 LAB — VALPROIC ACID LEVEL: Valproic Acid Lvl: 61.6 ug/mL (ref 50.0–100.0)

## 2010-07-09 LAB — BASIC METABOLIC PANEL
Calcium: 8.7 mg/dL (ref 8.4–10.5)
Chloride: 106 mEq/L (ref 96–112)
Creatinine, Ser: 1.55 mg/dL — ABNORMAL HIGH (ref 0.4–1.5)
GFR calc Af Amer: 58 mL/min — ABNORMAL LOW (ref >60)

## 2010-07-10 LAB — VALPROIC ACID LEVEL: Valproic Acid Lvl: 56.1 ug/mL (ref 50.0–100.0)

## 2010-07-11 DIAGNOSIS — F259 Schizoaffective disorder, unspecified: Secondary | ICD-10-CM

## 2010-07-13 NOTE — Consult Note (Signed)
[name], Benjamin NO.:  [id number]  MEDICAL RECORD NO.:  [id number]           PATIENT TYPE:  LOCATION:                                 FACILITY:  PHYSICIAN:  [name]. [name], M.D.DATE OF BIRTH:  [birth year]  DATE OF CONSULTATION:  07/02/2010 DATE OF DISCHARGE:                                CONSULTATION   REFERRING PHYSICIAN:  Dr. Lynelle Doctor  REASON FOR CONSULTATION:  Medical clearance.  BRIEF HISTORY:  The patient is a 50 year old male from [place] who has PTSD.  He was admitted with hallucinations, suicidal and homicidal ideation.  He had been in the ED psych, pending placement little over a week ago, and was admitted because he had pancytopenia.  He was subsequently discharged without placement because his psychosis had resolved.  He is now back in the hospital pending admission to [facility].  He has been noted by one of the nurses that he had a small reddened area on his buttocks with stool coming from it.  He has been examined by Dr. Lynelle Doctor who could not find anything of significance. Psychiatry requested we see the patient before he undergo hospitalization at psychiatric facility.  PAST MEDICAL HISTORY: 1. PTSD. 2. Schizoaffective disorder bipolar type with acute delirium and     psychosis. 3. Hospitalized with pancytopenia, June 23, 2010 through June 26, 2010. 4. Hepatitis C. 5. Cirrhosis. 6. Gout. 7. Hypothyroid. 8. History of perineal cellulitis. 9. Right great toe cellulitis. 10.Lower extremity edema secondary to hypoalbuminemia. 11.Deconditioning. 12.History of hypertension. 13.Multiple hospitalizations for psychiatric problems.  SURGICAL HISTORY: 1. Cervical fusion. 2. Thoracentesis.  All this information comes from previous hospital records, none could be obtained from the patient.  FAMILY HISTORY:  Father has a history of CABG and arrhythmias, no other family history could be obtained.  SOCIAL HISTORY:  The  patient denies alcohol, tobacco, or drugs.  REVIEW OF SYSTEMS:  Could not be obtained.  DISCHARGE MEDICATIONS:  On June 26, 2010, included, 1. Klonopin 1 mg daily. 2. Depakote 1 mg h.s. 3. Doxycycline 100 mg b.i.d. 4. Neurontin 600 mg t.i.d. 5. Lactulose 30 cc t.i.d. 6. OxyIR 5 mg q.4 p.r.n. 7. Folate 1 mg daily. 8. Synthroid 25 mcg daily. 9. Multivitamin daily. 10.Artane 5 mg daily.  The patient was taken off Geodon and Zoloft.  ALLERGIES: 1. PENICILLIN 2. IMIPRAMINE. 3. LACTAMIL. 4. HEPARIN reported HIT    PHYSICAL EXAMINATION:  Dr. Zachery Dakins and I, both approach the patient together along with his nurse and asked to examine him.  He flatly refused any examination.  It was Dr. Annette Stable opinion that this not warrant any further confrontation. VITAL SIGNS:  The last set included temperature of 97.9, heart rate of 88, blood pressure of 118/83, respiratory rate of 18, saturations of 99%.  CT scan of the abdomen and pelvis obtained on June 23, 2010, was reviewed by Dr. Zachery Dakins with Radiology.  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  A large amount of ascites and massive splenomegaly.  He was developing small spontaneous splenorenal shunt.  Marked gallbladder wall thickening, was  seen with cirrhosis, but did not indicate cholecystitis.  He has a small hiatal hernia.  Mildly enlarged lymph nodes, adjacent to the esophagus, and hiatal hernia.  No significant lymphadenopathy elsewhere.  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  Innumerable calcified loose bodies in the joint.  Small bilateral pleural effusions and massive atelectasis in the lower lobes.  Large and small bowel both looked normal.  The cuts did not go completely to the base of the rectum so there is a possibility of something distal, but no evidence of this.  IMPRESSION:  The patient refused for [place] to examine  him.  Review of his CT, as above.  The patient denies any anal pain or symptoms.     Eber Hong, P.A.   ______________________________ [name]. Zachery Dakins, M.D.    WDJ/MEDQ  D:  07/02/2010  T:  07/03/2010  Job:  [id number]  cc:   Psychiatry  Electronically Signed by Sherrie George P.A. on 07/08/2010 06:25:12 PM Electronically Signed by Consuello Bossier M.D. on 07/13/2010 08:57:35 AM

## 2010-07-29 NOTE — Consult Note (Signed)
[name], [name] NO.:  [id number]  MEDICAL RECORD NO.:  [id number]           PATIENT TYPE:  E  LOCATION:  WLED                         FACILITY:  [facility]  PHYSICIAN:  Brendia Sacks, MD    DATE OF BIRTH:  11-Sep-1960  DATE OF CONSULTATION: DATE OF DISCHARGE:  06/29/2010                                CONSULTATION   REQUESTING PHYSICIANS: 1. Sunnie Nielsen, M.D. 2. Eulogio Ditch, M.D.  REASON FOR CONSULTATION:  Positive D-dimer, bilateral lower extremity edema.  HISTORY OF PRESENT ILLNESS:  This is a 50 year old male with a history of schizoaffective disorder and psychosis who was recently admitted in March of this year when he became pancytopenic while he was awaiting placement at a psychiatric institution.  His psychosis apparently improved to the point where he was discharged home in late March.  The patient then presented back to the emergency room apparently on April 2 with suicidal ideation and difficult to manage behavior.  He has remained in the emergency room since April 2 awaiting psychiatric facility placement.  The patient has been doing fairly well with medications as recommended by Psychiatry, but he has been noted to have increased lower extremity edema bilaterally and a persistently positive D-dimer.  I was asked to see the patient in consultation for recommendations.  The patient is pleasant but does not answer questions appropriately and spontaneously brings of suicide and then also start discussing other things not pertinent to the interview.  The patient can really provide no reliable history at this point secondary to his psychosis and inappropriate verbal statements.  I have reviewed his medical record to obtain history and reviewed his hospitalization in the emergency room thus far.  REVIEW OF SYSTEMS:  Not reliable and is listed in the HPI, otherwise as not obtainable.  PAST MEDICAL HISTORY: 1. Schizoaffective disorders  with psychosis. 2. PTSD. 3. Pancytopenia in late March 2012, which was thought to be secondary     to Risperdal. 4. Hepatitis C with cirrhosis and splenomegaly. 5. Gout. 6. Hypothyroidism. 7. Hypoalbuminemia with lower extremity edema. 8. Hypertension. 9. Lithium-induced diabetes insipidus. 10.Drug overdose in the past requiring ventilatory assistance. 11.Right hip fracture in 1996. 12.Ascites. 13.Right toe wound.  PAST SURGICAL HISTORY: 1. Cervical fusion. 2. Thoracentesis.  FAMILY HISTORY:  Father with open heart surgery.  SOCIAL HISTORY:  Nonsmoker and nondrinker.  ALLERGIES:  PENICILLIN, IMIPRAMINE, LAMICTAL and HEPARIN, which causes heparin-induced thrombocytopenia.  CURRENT MEDICATIONS: 1. Klonopin 1 mg p.o. t.i.d. 2. Depakote 1000 mg p.o. q.h.s. 3. Doxycycline 100 mg p.o. b.i.d. 4. Folic acid 1 mg p.o. daily. 5. Gabapentin 100 mg p.o. t.i.d. 6. Lactulose 30 mL p.o. b.i.d. 7. Levothyroxine 25 mcg p.o. daily. 8. Multivitamin p.o. daily. 9. Nicotine transdermal patch 21 mg per 24 hours. 10.Risperdal 6 mg p.o. q.h.s. 11.Trihexyphenidyl 5 mg p.o. q.h.s. 12.Benadryl as needed. 13.Oxycodone 5 mg every 4 hours as needed for pain. 14.Geodon 20 mg IM every 12 hours as needed.  PHYSICAL EXAMINATION:  Most recent VITAL SIGNS:  Blood pressure 125/76, respirations 20 and unlabored, heart rate 110, saturation 95% on room air. GENERAL:  This  is a well-developed, well-nourished man in no acute distress currently sitting in a wheelchair.  HEAD:  Appears to be normal.  EYES:  Sclerae are clear.  Pupils are equal, round, and reactive to light.  Lids and irises appear unremarkable.  His hearing is grossly normal.  He appears to be edentulous.  Lips and tongue appear unremarkable. NECK:  Supple.  No lymphadenopathy or masses.  No thyromegaly. CHEST:  Clear to auscultation bilaterally.  No wheezes, rales or rhonchi.  There is normal respiratory effort.  CARDIOVASCULAR:   Regular rate and rhythm.  No murmurs, rubs or gallops.  He has 2 to 3+ bilateral lower extremity edema. ABDOMEN:  Soft, nontender, and nondistended. PSYCHIATRIC:  Flat affect, odd mood, inappropriate statements as noted above.  ANCILLARY STUDIES:  Bilateral lower extremity venous Dopplers done today showed no evidence of DVT bilaterally. Recent bilateral lower extremity Dopplers done on June 29, 2010 showed no evidence of DVT.  Again studies also on March 27 of this year showed no evidence of DVT.  PERTINENT LABORATORY STUDIES: 1. CBC is notable for a white blood cell count of 4.1, hemoglobin 9.7,     platelet count 143.  These appear to be at baseline. 2. D-dimer is 2.25.  Review of his record demonstrates a persistently     elevated D-dimer noted in 2011 as well. 3. Basic metabolic panel notable for a creatinine of 1.58, which     appears to be near his baseline. 4. Hepatic function panel notable for hypoalbuminemia. 5. Ammonia is modestly elevated at 37. 6. BMP is unremarkable.  IMPRESSION AND RECOMMENDATIONS:  A 50 year old man who has been in the emergency room since April 2 with psychotic behavior.  1. Chronic lower extremity edema.  Upon review of the patient's     medical record and history and physicals, I note that this has been     present since at least April 2008.  He has had several negative     venous Dopplers bilaterally, and his most recent echocardiogram in     November 2011 demonstrated a left ventricular ejection fraction of     60-65%.  The patient does have hypoalbuminemia from his cirrhosis,     and I suspect this is the etiology of his lower extremity edema.     Could consider Lasix, however, this is not likely to     intravascularly deplete him, but without significantly improving     his edema.  However, this could certainly be considered.  He does     not have any evidence of volume overload at this point, and I     suspect his pitting edema will  remain without change. 2. Positive D-dimer.  Review of his record again demonstrates this     appears to be chronically elevated and was noted in November 2011     when he had a negative ventilatory perfusion scan at that time.  He     has no symptoms of pulmonary embolism, and his Dopplers have been     negative.  I would recommend no further evaluation.  Note that he     cannot be on anticoagulants secondary to his history of heparin-     induced thrombocytopenia. 3. Chronic anemia and thrombocytopenia, which does appear to be stable     and is likely to be associated with his hepatitis C and cirrhosis. 4. Chronic kidney disease, stage 3.  This appears to be stable. 5. History of schizoaffective disorder, posttraumatic  stress disorder     and hepatitis C.  Would defer of course his management of     schizoaffective disorder to Dr. Rogers Blocker.  In summary, I do not see an indication for medical admission at this time.  I would recommend no further evaluation for his D-dimer.  Again could consider Lasix for his lower extremities, however, I think elevation would be a more appropriate treatment.  Certainly if Lasix were to be instituted would follow his BMP and monitor for intravascular depletion.  Thank you for this consultation.  I will sign off, but please call if I can be of further assistance.     Brendia Sacks, MD     DG/MEDQ  D:  07/06/2010  T:  07/06/2010  Job:  [id number]  Electronically Signed by Brendia Sacks  on 07/29/2010 09:57:37 PM

## 2010-08-11 NOTE — H&P (Signed)
[name], [name] NO.:  [id number]   MEDICAL RECORD NO.:  [id number]          PATIENT TYPE:  IPS   LOCATION:  0304                          FACILITY:  [facility]   PHYSICIAN:  [name], M.D.      DATE OF BIRTH:  10/07/1960   DATE OF ADMISSION:  08/02/2008  DATE OF DISCHARGE:                       PSYCHIATRIC ADMISSION ASSESSMENT   IDENTIFYING INFORMATION:  A 50 year old male, single.  This is an  involuntary admission.   HISTORY OF PRESENT ILLNESS:  This is the second or third [facility] admission  for this 50 year old [place] immigrant who was petitioned by his family  after threatening to throw his mother down the stairway when she did not  immediately give him the car keys.  Family had also reported that he had  been pacing at night, displaying more motor restlessness, and they were  becoming afraid of him.  He is a large-built male.  They had reported  that he was not taking his medications.  He had been making statements  that he believed the President of the [place] was sending him  messages through the television, and that is why he needed to get the  car so that he could take off on a mission.  Today he is fully alert,  oriented x4.  Denies having any restlessness, and he denies  hallucinations.  Denies any dangerous thoughts and is perplexed about  why he is here.  Says that he was simply sitting on the porch drinking  coffee when the police came.  He has been cooperative with staff.   PAST PSYCHIATRIC HISTORY:  Second or third [facility] admission for this 48-  year-old who also has a history of several admissions to [facility] in the past and several admissions to the medical unit for  management of complications of lithium therapy.  He has history of  schizoaffective disorder and post-traumatic stress disorder from  previous experiences in [place].  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  He is currently managed by the ACT Team as an outpatient by  Dr. Geralyn Hull.  His medication compliance is unclear.   SOCIAL HISTORY:  Single male originally from [place], lives here in  [place] with his parents.  He is on disability for a mental illness.  No known current legal problems.   MEDICAL HISTORY:  Primary care practitioner is [facility].  Medical  problems include right hip pain secondary to an old fracture in 1996 and  thrombocytopenia, NOS.  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   CURRENT MEDICATIONS:  1. Risperidone 6 mg at bedtime.  2. Risperdal Consta 50 mg IM every 14 days, last given on Aug 02, 2008.  3. Levothyroxine 25 mcg daily.  4. Depakote 750 mg b.i.d.  5. Gabapentin 400 mg t.i.d.  6. Artane 5 mg q.i.d.  7. Doxepin 50 mg p.o. at bedtime.  8. Ativan 1 mg b.i.d.   DRUG ALLERGIES:  LAMICTAL, reaction unknown.   REVIEW OF SYSTEMS:  Generally unremarkable.   PHYSICAL EXAMINATION:  Physical exam was done here on the unit,  documented in the record generally unremarkable.  He does ambulate with  a limp, has an antalgic gait, normal motor.  Gait is stable.  Completely  independent in all ADLs.  Physical examination generally unremarkable.   MENTAL STATUS EXAMINATION:  Fully alert male, pleasant, cooperative  today.  Good eye contact, shakes hands readily, no signs of internal  distractions, no guarding.  Gives responses readily.  Speech is  nonpressured.  He has been appropriate with staff and peers here on the  unit.  Gives a coherent history.  No memory of being  aggressive, denies  any hallucinations.  Denies any dangerous thoughts, oriented to person,  place and situation.  Thinking logical.   AXIS I:  Schizoaffective disorder, not otherwise specified.  AXIS II:  Deferred.  AXIS III:  Chronic right hip pain.  Hypothyroidism.  Thrombocytopenia,  not otherwise specified.  AXIS IV:  Deferred.  Having a stable home situation and supportive  parents are assets.  AXIS V:  Current 42.  Past year 38 estimated.   PLAN:  Involuntarily admit him for observation.  He has been compliant  taking medications regularly, and we are awaiting results of his  Depakote level, UA and urine drug screen.      [name], N.P.      [name], M.D.  Electronically Signed    MAS/MEDQ  D:  08/03/2008  T:  08/03/2008  Job:  [id number]

## 2010-08-14 NOTE — H&P (Signed)
[facility]  Patient:    [name], [name]                          MRN: [id number] Adm. Date:  [id number] Attending:  Otilio Saber                   Psychiatric Admission Assessment  DATE OF EVALUATION:  July 18, 2000  INTRODUCTION:  [name] is a 50 year old white single male who lives with parents.  Patient is a [place] immigrant.  At present, he is a [place] citizen who came to the [place] at 6 or 7 years ago.  CIRCUMSTANCES OF ADMISSION:  Patient was admitted on involuntary commitment and apparently he decompensated rapidly.  Auditory and tactile hallucinations were reported, also increase of agitation.  He complains of having flashbacks from war experience.  There was marked increasing paranoia; patient is afraid that something is going to happen to him but was very vague in description of what can really happen.  Patient refused to sign papers upon admission because he does not believe in signing any papers, but basically came on his own will in order to get some medication.  There are reports about marked decrease in sleep and appetite and getting confused with his thoughts for several days prior to admission.  There was a question raised of patient compliance with planned medication.  He denied and there was no mention of suicidal or homicidal ideations but he became easily agitated and angry, allegedly in self-defense, but aggressive.  PAST PSYCHIATRIC HISTORY:  Patient was hospitalized at [facility] several times, discharged the last time on July 08, 2000. Previously, he was living in a halfway house but most recently with his parents.  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  He had a high school education in [place]. Speaks very basic English but it was easy to understand him and he seemed to have no difficulty understanding examiner.  He also denies  drinking or using any illicit drugs.  FAMILY HISTORY:  Patient denies family history of mental illness. MEDICAL HISTORY:  Patient denies medical problems.  By the papers, he has history of anemia and a long time ago he injured his leg and he walks with a right-sided limp.  MEDICATIONS:  Most recently, he was on Risperdal 2 mg at bedtime, lithium carbonate 750 mg daily and Remeron 30 mg at bedtime.  As mentioned before, it is not clear whether he was compliant with medication.  ALLERGIES:  Patient is allergic to LAMICTAL, PENICILLIN and IMIPRAMINE.  PHYSICAL EXAMINATION:  Patient was seen in the emergency room and does not seem to be having any ongoing medical problems.  Blood pressure was slightly elevated at 156/92.  MENTAL STATUS EXAMINATION:  Tall, strong-built white male who looks older than his current age, grayish hair, unkempt, poor hygiene, normal motor activity, very guarded, paranoid, refused to shake hands with me, refused to follow me to the office, at least at the beginning.  He denies hallucinations but staff reports that they have seen him talking to himself.  Mood was described as "Im sad."  Affect was almost flat with some underlying anger.  Thoughts were impoverished, very concrete, moving with slow pace.  No suicidal or homicidal thoughts.  There was marked increase in paranoia and delusional beliefs that some people were trying to hurt him.  He  also complained of having flashbacks from war time memories.  No symptoms of obsessive-compulsive disorder.  Alert and oriented x 3 with fair memory but strong difficulty with concentration. Insight and judgment were poor.  Intelligence seemed to in low-normal range. Reliability uncertain.  DIAGNOSTIC IMPRESSION: Axes I:    1. Schizoaffective disorder, depressed, exacerbated.            2. Posttraumatic stress disorder by history. Axis II:   No diagnosis. Axis III:  History of anemia. Axis IV:   Moderate stressors --  problem with primary support group and access            to health care services. Axis V:    Current global assessment of functioning 30; for the past year,            maximum estimated at 65.  PLAN:  We will put patient on special observation.  We will adjust neuroleptics and mood stabilizer pending the results of blood work.  We will check with his family for a possibility of discharging back home or to group home setting.  ESTIMATED TIME OF STAY:  Between seven and eight days, depends on progress and treatment and patient compliance with medication.  DD:  07/19/00 TD:  07/20/00 Job: [id number]

## 2010-08-14 NOTE — Discharge Summary (Signed)
NAMERAWAD, BOCHICCHIO NO.:  [id number]   MEDICAL RECORD NO.:  [id number]          PATIENT TYPE:  IPS   LOCATION:  0304                          FACILITY:  [facility]   PHYSICIAN:  Anselm Jungling, MD  DATE OF BIRTH:  Aug 29, 1960   DATE OF ADMISSION:  08/02/2008  DATE OF DISCHARGE:  08/12/2008                               DISCHARGE SUMMARY   IDENTIFYING INFORMATION/JUSTIFICATION FOR ADMISSION AND CARE:  This was  an inpatient psychiatric admission for any Corwin, a 50 year old  unmarried [place] male who was admitted due to exacerbation of  bipolar disorder and psychosis.  Please refer to the admission note for  details pertaining to the symptoms, circumstances and history that led  to his hospitalization.  He was given an initial Axis I diagnosis of  bipolar disorder NOS.   MEDICAL & LABORATORY:  The patient was medically and physically assessed  by the psychiatric nurse practitioner.  He has history of hypothyroidism  and was continued on Synthroid 25 mcg daily.  His thyroid indices were  essentially within normal limits.  He also has a history of hypertension  and edema.  He was continued on Lasix 20 mg daily for this.  He had  significant pedal and ankle edema bilaterally.  This increased during  his inpatient stay, with significant reddening of the overlying skin.  Dicloxacillin 250 mg q.i.d. was prescribed.  We asked the patient to  remain in the hospital an additional day so that we could have internal  medicine see him, but he refused.  There were no acute medical issues.   HOSPITAL COURSE:  The patient was admitted to the adult inpatient  psychiatric service.  He presented as a large, overweight adult male who  showed disorganized and tangential thinking, and a significant degree of  irritability.  He was admitted after becoming increasingly agitated at  home.  He had a history of violent attacks towards his elderly parents,  with whom he lives.  He is a  client of the assertive community treatment  team, and they were in accordance with the recommendation that he be  admitted for medication stabilization.  He came to [place] on a regimen of  Risperdal and Depakote, with Neurontin, and Artane as well.   The patient was continued on Risperdal and Depakote.  His Depakote level  was increased to get his serum Depakote level higher into the  therapeutic range.  His Neurontin dose was increased.  Ativan 1 mg  b.i.d. was added.   It took several days for the patient to stabilized.  In the meantime, he  was intermittently irritable, confused, and disorganized.  On the  seventh hospital day there was a family session, which was attempted as  a pre discharge family session, but the patient became quite irritable  and expressed a lot of paranoid ideation towards the ACT team  representative.  As such, it was felt that he was not ready for  discharge.  He was held over the weekend, and then appeared markedly  improved following the weekend.  He appeared appropriate for  discharge  on the 11th hospital day.  He agreed to following aftercare plan.   AFTERCARE:  The patient was to follow-up with the assertive community  treatment team, and was to be seen by their psychiatrist within the  week.   DISCHARGE MEDICATIONS:  1. Risperdal 1 mg b.i.d. and 4 mg q.h.s.  2. Risperdal Consta 50 mg IM q. 14 days, next due Aug 16, 2008.  3. Depakote 1000 mg b.i.d.  4. Sinequan 50 mg q.h.s.  5. Synthroid 25 mcg daily.  6. Neurontin 600 mg t.i.d.  7. Artane 5 mg q.i.d.  8. Ativan 1 mg b.i.d.  9. Lasix 20 mg daily times x2 days.  10.Dicloxacillin 250 mg q.i.d. times 12 days.   SPECIAL INSTRUCTIONS:  The patient was instructed to the emergency room  if he experienced any chest pain or breathing problems.   FOLLOWUP:  [facility] appointment was made for him on June 10 at 2:00  p.m..   DISCHARGE DIAGNOSES:  AXIS I: Schizoaffective disorder, most recently of   mixed type with psychotic features, resolving.  AXIS II: Deferred.  AXIS III: History of hypothyroidism, obesity, hypertension, bilateral  pedal edema.  AXIS IV: Stressors severe.  AXIS V: GAF on discharge.      Anselm Jungling, MD  Electronically Signed     SPB/MEDQ  D:  08/13/2008  T:  08/13/2008  Job:  [id number]

## 2010-08-14 NOTE — Discharge Summary (Signed)
[facility]  Patient:    [name], [name]                          MRN: [id number] Adm. Date:  [id number] Disc. Date: 08/08/00 Attending:  Otilio Saber                           Discharge Summary  IDENTIFYING DATA:  [name] is a 50 year old single white male, a [place] immigrant, who presents under involuntary commitment with a history of increasing psychosis and agitation.  He had a long history of psychotic and posttraumatic stress disorder from his experiences in [place].  He had become increasingly agitated and delusional with disorganized thinking and reported somatic hallucinations.  He had markedly decreased sleep and was displaying psychomotor agitation; he also apparently had been hearing voices and was paranoid about conflicts in the [place].  Patient had been hospitalized at [facility] several times, with the last discharge being on July 08, 2000.  According to the mental health center, he takes many months to stabilize.  He was on Risperdal 2 mg q.h.s., lithium carbonate 750 mg q.d. and Remeron 30 mg q.h.s.  It was unclear whether he had been compliant with his medication.  Patient denied any significant medical problems, although he reportedly had a history of anemia and had a previous injury to his leg.  ALLERGIES:  He reported being allergic to LAMICTAL, PENICILLIN and IMIPRAMINE.  PHYSICAL EXAMINATION:  Physical exam on admission showed a mildly elevated blood pressure and some edema of his lower extremities.  MENTAL STATUS EXAMINATION:  Mental status exam on admission revealed a tall white male who was very guarded and paranoid, refusing to shake hands and refusing to go into an office for an interview.  He was denying hallucinations but he appeared to be talking to himself at times.  Thought processes showed paranoid ideations as well as the hallucinations.  He had the delusional belief that people were going to harm  him and said that he had flashbacks and wartime memories.  He denies suicidal or homicidal ideation.  Mood was irritable but not depressed.  Oriented x 3.  Cognitive functioning appeared intact.  ADMITTING DIAGNOSES: Axes I:    1. Schizoaffective disorder, depression.            2. Posttraumatic stress disorder, by history. Axis II:   No diagnosis. Axis III:  History of anemia. Axis IV:   Psychosocial stressors moderate. Axis V:    Global assessment of functioning currently 30; highest the last            year was 65.  LABORATORY FINDINGS:  Patient had initially elevated WBC of 14.6 but the remainder of his CBC was normal.  Blood chemistries were normal except for slightly elevated bilirubin of 1.8.  Most recent blood chemistries were entirely normal.  Amylase was normal.  His most recent lithium level was 0.84 and most recent valproic acid level was 51.3.  His last CBC showed a WBC of 10.6 and hemoglobin of 12.2 and hematocrit of 36.2.  TSH on admission was 6.776, it was later 1.62 but then again later was 7.26.  His T4 and T3 were normal.  Urine drug screen was negative.  HOSPITAL COURSE:  Patient was admitted to [facility] for treatment of his psychosis and agitation.  He was initially placed on Risperdal and continued on lithium.  His Risperdal was gradually increased but patient remained very disorganized and agitated.  At time, he would not speak [place], but then at other times would speak English quite well.  He continued to be very paranoid and agitated, inappropriately laughing at times, and becoming threatening towards staff when his perceived needs were not met immediately.  He would calm on occasion but would quickly decompensate with any perceived conflict.  We continued to increase his lithium and added Seroquel.  Patient was sleeping only a couple of hours a night.  He had edema of his lower extremities and was seeing a Control and instrumentation engineer.  It was felt  to have some cellulitis of his right foot and was placed on antibiotics.  The patient would take his antibiotics only sporadically and would not stay off of his foot as requested.  He was treated with a course of Ceftin and later added Flagyl.  Patients leg showed improvement and he was taken off of Ceftin and just continued on Flagyl.  The medical physician signed off.  He has had several Doppler studies which showed no evidence of deep venous thrombosis. He also had an elevated TSH and was on Synthroid.  The patient remained quite agitated and delusional and we elected to add Depakote to his lithium, Risperdal and Seroquel.  He continued to be agitated and we elected to place him on Haldol to see if that might be a more effective antipsychotic.  He remained delusional, talking about being unable to fight the Taliban and stating that he worried that people thought that he hated [place].  He would be demanding and want to call his girlfriend every few minutes.  Despite medication management, he remained quite agitated and paranoid and it was felt that he would need longer-term treatment.  He was seen by the PACT team from the mental health center who stated that would likely require several months to clear and they recommended that he be transferred to the state hospital for more ongoing treatment.  They felt that he was not near his baseline and could not safely be managed at home.  CONDITION ON DISCHARGE:  Patient was discharged in minimally improved condition with some mild decrease in his agitation and some improvement in his cellulitis of his right leg.  He remained psychotic and in need of ongoing treatment.  DISPOSITION:  The patient will be transferred on a commitment to [facility] for longer-term treatment.  CURRENT MEDICATIONS: 1. Haldol 5 mg t.i.d. 2. Seroquel 25 mg b.i.d. and 50 mg q.h.s. 3. Lithium carbonate 600 mg q.a.m. and 900 mg q.h.s. 4. Depakote 1500 mg  ER q.h.s. 5. Synthroid 50 mcg q.d. 6. Flagyl 500 mg t.i.d. 7. Cogentin 1 mg b.i.d.  FINAL DIAGNOSES: Axes I:    1. Schizoaffective disorder.             2. Posttraumatic stress disorder, by history. Axis II:   No diagnosis. Axes III:  1. Resolving cellulitis of the right foot.            2. Anemia.            3. Hypothyroidism. Axis IV:   Psychosocial stressors, moderate. Axis V:    Global assessment of functioning:  Current is 40; highest the past            year is 65. DD:  08/08/00 TD:  08/08/00 Job: [id number]

## 2010-08-14 NOTE — H&P (Signed)
NAMEBRYAM, [name] NO.:  [id number]   MEDICAL RECORD NO.:  [id number]          PATIENT TYPE:  INP   LOCATION:  2015                         FACILITY:  [facility]   PHYSICIAN:  [name], M.D.  DATE OF BIRTH:  [birth year]   DATE OF ADMISSION:  07/05/2006  DATE OF DISCHARGE:                              HISTORY & PHYSICAL   CHIEF COMPLAINT:  Altered mental status/confusion.   HISTORY OF PRESENT ILLNESS:  This is a 50 year old male.  Patient is a  very poor historian.  He is confused, rambling and unable to provide a  coherent history.  He is a resident of [facility] assisted-living  facility and was apparently referred to the emergency department because  of increasing confusion.  Nothing else is known.   PAST MEDICAL HISTORY:  1. Schizoaffective disorder.  2. Post-traumatic stress disorder, status post multiple previous      admissions to [facility].  3. Gout.  4. Hypothyroidism.  5. Diabetes insipidus.  6. Status post cervical/occipital fusion surgery.  7. Status post right hip fracture 1996.  8. Query history of hepatitis C.  9. Hypertension.  10.Status post admission to [facility] 05/25/2006, 05/28/2006      for left lower extremity cellulitis and lithium toxicity.  11.VRE noted on urinalysis 07/01/2006, according to accompanying labs      from assisted living facility.   MEDICATION HISTORY:  This is unobtainable from the patient, but was  obtained from assisted living facility records.   1. Trihexyphen 5 mg p.o. t.i.d.  2. Lithium carbonate 300 mg p.o. q. a.m. and 600 mg p.o. nightly.  3. Depakote 500 mg p.o. b.i.d. and 250 mg p.o. nightly.  4. Levothyroxine 25 mcg p.o. daily.  5. Allopurinol 100 mg p.o. daily.  6. Zinc sulfate 220 mg p.o. daily.  7. Neurontin 400 mg p.o. t.i.d.  8. Propranolol 10 mg p.o. b.i.d.  9. Lasix 20 mg p.o. daily.  10.Doxepin 50 mg p.o. nightly.   ALLERGIES:  1. Imipramine  2. Lamictal  3.  Penicillin.   REVIEW OF SYSTEMS:  Unobtainable.   SOCIAL HISTORY:  The patient is currently a resident of [facility].  The rest of the information is gleaned from  his electronic medical records and apparently he is a [place] immigrant  who is currently a [place] citizen, came [place] the [place] approximately [place]  ago.  Nondrinker, nonsmoker, has no history of drug abuse.  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   FAMILY HISTORY:  Unobtainable.   PHYSICAL EXAMINATION:  VITALS:  Temperature 98.8, pulse 64 per minute  regular, respiratory rate 20, BP 91/60 mmHg, pulse oximeter 98% on room  air.  GENERAL:  The patient does not appear to be in obvious acute distress,  alert, disoriented, communicative, however, rambles a lot, appears  forgetful, not very coherent and does not answer questions directly.  HEENT: No clinical pallor, no jaundice.  No conjunctival injection.  Throat is clear.  NECK:  Neck is supple.  JVP not seen.  No palpable lymphadenopathy.  No  palpable goiter.  No carotid bruits.  CHEST:  Clinically clear to auscultation.  No wheezes or crackles.  HEART:  Heart sounds S1 and S2 heard, normal, regular, no murmurs,  bradycardic.  ABDOMEN:  Full, soft and nontender.  No palpable organomegaly.  No  palpable masses.  Normal bowel sounds.  No clinical ascites.  LOWER EXTREMITY EXAMINATION:  The patient has bilateral mild to moderate  lower extremity edema with stasis changes.  MUSCULOSKELETAL SYSTEM:  Not formally examined secondary to lack of  cooperation.  CENTRAL NERVOUS SYSTEM:  No focal neurologic deficits on gross  examination.   INVESTIGATIONS:  CBC: WBC 7.8, hemoglobin 11.7, hematocrit 35.5,  platelets 186.  Electrolytes:  Sodium 140, potassium 4.0, chloride 109,  CO2 27, BUN 9, creatinine 1.17.  Glucose 93.  LFTs are normal.  Urinalysis is negative.  Depakote levels 90.7.  Lithium level  2.31  (normal 0.8-1.4).  Chest x-ray dated 07/05/2006 shows no acute  cardiopulmonary findings, although there is chronic volume loss in the  bases.  EKG dated 07/05/2006 shows sinus rhythm regular 61 per minute,  normal axis, no acute ischemic changes.   ASSESSMENT AND PLAN:  1. Lithium toxicity.  The patient has no EKG abnormalities.  Sinus      bradycardia is explained by patient's beta blocker treatment.  We      shall admit the patient for telemetric monitoring, of course hold      Lithium and diuretics, manage with intravenous fluid infusion and      follow Lithium levels.   1. Altered mental status/confusion.  This is likely secondary to #1,      above.  Chest x-ray is negative, as is urinalysis.  We shall,      however, for completeness, do blood cultures.   1. Psychiatric problems.  We shall continue pre-admission      psychotropic's, with the exception of Lithium.   1. History of diabetes insipidus.  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  Perhaps,      continued Lithium treatment should be reviewed by psychiatrist,      during the course of this hospitalization.   1. Hypertension.  The patient's blood pressure is low normal at      present.  We shall therefore hold Lasix and beta blocker, and      monitor.   1. History of gout.  This is currently asymptomatic.  We shall      continue Allopurinol.   1. Hypothyroidism.  He shall continue Synthroid in pre-admission      dosages and check TSH.   Further management will depend on clinical course.      [name], M.D.  Electronically Signed     CO/MEDQ  D:  07/05/2006  T:  07/06/2006  Job:  [id number]

## 2010-08-14 NOTE — Discharge Summary (Signed)
[name], [name]                 ACCOUNT NO.:  [id number]   MEDICAL RECORD NO.:  [id number]          PATIENT TYPE:  INP   LOCATION:  6729                         FACILITY:  [facility]   PHYSICIAN:  Wilson Singer, M.D.DATE OF BIRTH:  18-Apr-1960   DATE OF ADMISSION:  07/05/2006  DATE OF DISCHARGE:  07/08/2006                               DISCHARGE SUMMARY   FINAL DISCHARGE DIAGNOSES:  1. Lithium toxicity.  2. Altered mental status/confusion secondary to #1.  3. Diabetes insipidus.  4. Schizoaffective disorder.  5. Hypertension.   MEDICATIONS ON DISCHARGE:  1. Artane 5 mg t.i.d.  2. Levothyroxine 25 mcg daily.  3. Allopurinol 100 mg daily.  4. Neurontin 400 mg t.i.d.  5. Doxepin 50 mg at bedtime.  6. Depakote 750 mg at bedtime.  7. Depakote 500 mg daily.  8. Lithium 300 mg daily.  9. Lithium 600 mg at bedtime.   CONDITION ON DISCHARGE:  Stable.   HISTORY:  This 50 year old man came in confused, rambling, and with an  incoherent history.  He is a resident of [facility] assisted living  facility and was referred by the emergency department because of  increasing confusion.   HOSPITAL PROGRESS:  It was clear that he had lithium toxicity, with  admission lithium level of 2.31, with the normal range being 0.8-1.4.  He was rehydrated.  His lithium was temporarily discontinued.  He seemed  to do well with simple rehydration, and about 36 hours later his  confusion had virtually resolved.  He was more awake, alert, and  responding appropriately to questions and commands.  On the day of  discharge, he looks well, and he is alert.  His temperature is 97.5,  blood pressure 110/75, pulse 64, saturation 97% on room air.  His lung  fields are clear.  Neurologically, he is alert and oriented, with no  focal neurological signs.   INVESTIGATIONS:  Sodium 143, potassium 4.8, chloride 116, bicarbonate  24, BUN 4, creatinine 0.97.  Lithium level today is 1.34, in the normal  range.   FURTHER DISPOSITION:  His Lasix and propranolol have been discontinued  while he has been here, and they should remain discontinued until  further assessment is taken.  It is important to keep him well hydrated  with the lithium, and lithium levels need to be checked periodically.  Whilst he was here, Dr. Jeanie Sewer, psychiatrist, had seen  him and was comfortable with restarting his lithium.  He did not want to  change any doses or any other medications that he was already taking.  Dr. Jeanie Sewer has expressed the wish that he get a psychiatry  appointment during the first week after discharge, and this must be  followed to keep his lithium levels under good check.      Wilson Singer, M.D.  Electronically Signed     NCG/MEDQ  D:  07/08/2006  T:  07/08/2006  Job:  [id number]   cc:   Antonietta Breach, M.D.
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
## 2010-09-05 ENCOUNTER — Emergency Department (HOSPITAL_COMMUNITY)
Admission: EM | Admit: 2010-09-05 | Discharge: 2010-09-05 | Disposition: A | Discharge status: Home / Self Care | Payer: Medicaid Other | Attending: Emergency Medicine | Admitting: Emergency Medicine

## 2010-09-05 DIAGNOSIS — I10 Essential (primary) hypertension: Secondary | ICD-10-CM | POA: Insufficient documentation

## 2010-09-05 DIAGNOSIS — R609 Edema, unspecified: Secondary | ICD-10-CM | POA: Insufficient documentation

## 2010-09-05 DIAGNOSIS — E039 Hypothyroidism, unspecified: Secondary | ICD-10-CM | POA: Insufficient documentation

## 2010-09-05 DIAGNOSIS — R188 Other ascites: Secondary | ICD-10-CM | POA: Insufficient documentation

## 2010-09-05 DIAGNOSIS — Z8619 Personal history of other infectious and parasitic diseases: Secondary | ICD-10-CM | POA: Insufficient documentation

## 2010-09-05 DIAGNOSIS — R109 Unspecified abdominal pain: Secondary | ICD-10-CM | POA: Insufficient documentation

## 2010-09-05 DIAGNOSIS — Z7901 Long term (current) use of anticoagulants: Secondary | ICD-10-CM | POA: Insufficient documentation

## 2010-09-05 LAB — DIFFERENTIAL
Basophils Absolute: 0 10*3/uL (ref 0.0–0.1)
Eosinophils Absolute: 0.1 10*3/uL (ref 0.0–0.7)
Lymphs Abs: 1.1 10*3/uL (ref 0.7–4.0)
Monocytes Absolute: 0.2 10*3/uL (ref 0.1–1.0)

## 2010-09-05 LAB — COMPREHENSIVE METABOLIC PANEL
ALT: 8 U/L (ref 0–53)
CO2: 27 mEq/L (ref 19–32)
Calcium: 8.9 mg/dL (ref 8.4–10.5)
Creatinine, Ser: 1.7 mg/dL — ABNORMAL HIGH (ref 0.4–1.5)
GFR calc Af Amer: 52 mL/min — ABNORMAL LOW (ref >60)
GFR calc non Af Amer: 43 mL/min — ABNORMAL LOW (ref >60)
Glucose, Bld: 80 mg/dL (ref 70–99)
Total Bilirubin: 0.5 mg/dL (ref 0.3–1.2)

## 2010-09-05 LAB — CBC
MCH: 31 pg (ref 26.0–34.0)
MCHC: 33.1 g/dL (ref 30.0–36.0)
Platelets: 101 10*3/uL — ABNORMAL LOW (ref 150–400)

## 2010-09-05 LAB — PROTIME-INR: Prothrombin Time: 17 seconds — ABNORMAL HIGH (ref 11.6–15.2)

## 2010-09-21 ENCOUNTER — Emergency Department (HOSPITAL_COMMUNITY): Payer: Medicaid Other

## 2010-09-21 ENCOUNTER — Inpatient Hospital Stay (HOSPITAL_COMMUNITY)
Admission: EM | Admit: 2010-09-21 | Discharge: 2010-10-04 | DRG: 442 | Disposition: A | Discharge status: Home / Self Care | Payer: Medicaid Other | Attending: Emergency Medicine | Admitting: Emergency Medicine

## 2010-09-21 DIAGNOSIS — B1920 Unspecified viral hepatitis C without hepatic coma: Principal | ICD-10-CM | POA: Diagnosis present

## 2010-09-21 DIAGNOSIS — D61818 Other pancytopenia: Secondary | ICD-10-CM | POA: Diagnosis present

## 2010-09-21 DIAGNOSIS — R071 Chest pain on breathing: Secondary | ICD-10-CM | POA: Diagnosis present

## 2010-09-21 DIAGNOSIS — F259 Schizoaffective disorder, unspecified: Secondary | ICD-10-CM | POA: Diagnosis present

## 2010-09-21 DIAGNOSIS — D7582 Heparin induced thrombocytopenia (HIT): Secondary | ICD-10-CM | POA: Diagnosis present

## 2010-09-21 DIAGNOSIS — E039 Hypothyroidism, unspecified: Secondary | ICD-10-CM | POA: Diagnosis present

## 2010-09-21 DIAGNOSIS — N183 Chronic kidney disease, stage 3 unspecified: Secondary | ICD-10-CM | POA: Diagnosis present

## 2010-09-21 DIAGNOSIS — E232 Diabetes insipidus: Secondary | ICD-10-CM | POA: Diagnosis present

## 2010-09-21 DIAGNOSIS — E440 Moderate protein-calorie malnutrition: Secondary | ICD-10-CM | POA: Diagnosis present

## 2010-09-21 DIAGNOSIS — I129 Hypertensive chronic kidney disease with stage 1 through stage 4 chronic kidney disease, or unspecified chronic kidney disease: Secondary | ICD-10-CM | POA: Diagnosis present

## 2010-09-21 DIAGNOSIS — F4310 Post-traumatic stress disorder, unspecified: Secondary | ICD-10-CM | POA: Diagnosis present

## 2010-09-21 DIAGNOSIS — R42 Dizziness and giddiness: Secondary | ICD-10-CM | POA: Diagnosis present

## 2010-09-21 DIAGNOSIS — K7460 Unspecified cirrhosis of liver: Secondary | ICD-10-CM | POA: Diagnosis present

## 2010-09-21 DIAGNOSIS — R188 Other ascites: Secondary | ICD-10-CM | POA: Diagnosis present

## 2010-09-21 DIAGNOSIS — K766 Portal hypertension: Secondary | ICD-10-CM | POA: Diagnosis present

## 2010-09-21 DIAGNOSIS — N179 Acute kidney failure, unspecified: Secondary | ICD-10-CM | POA: Diagnosis present

## 2010-09-21 DIAGNOSIS — D75829 Heparin-induced thrombocytopenia, unspecified: Secondary | ICD-10-CM | POA: Diagnosis present

## 2010-09-22 ENCOUNTER — Other Ambulatory Visit: Payer: Self-pay | Admitting: Diagnostic Radiology

## 2010-09-22 ENCOUNTER — Inpatient Hospital Stay (HOSPITAL_COMMUNITY): Payer: Medicaid Other

## 2010-09-22 LAB — CBC
HCT: 35.7 % — ABNORMAL LOW (ref 39.0–52.0)
HCT: 34.7 % — ABNORMAL LOW (ref 39.0–52.0)
Hemoglobin: 12.2 g/dL — ABNORMAL LOW (ref 13.0–17.0)
RBC: 3.94 MIL/uL — ABNORMAL LOW (ref 4.22–5.81)
RDW: 16.1 % — ABNORMAL HIGH (ref 11.5–15.5)
WBC: 3 10*3/uL — ABNORMAL LOW (ref 4.0–10.5)

## 2010-09-22 LAB — LACTATE DEHYDROGENASE, PLEURAL OR PERITONEAL FLUID: LD, Fluid: 29 U/L — ABNORMAL HIGH (ref 3–23)

## 2010-09-22 LAB — DIFFERENTIAL
Basophils Absolute: 0 10*3/uL (ref 0.0–0.1)
Basophils Relative: 1 % (ref 0–1)
Lymphocytes Relative: 36 % (ref 12–46)
Monocytes Relative: 10 % (ref 3–12)
Neutro Abs: 1.7 10*3/uL (ref 1.7–7.7)
Neutrophils Relative %: 51 % (ref 43–77)

## 2010-09-22 LAB — CARDIAC PANEL(CRET KIN+CKTOT+MB+TROPI)
CK, MB: 1.7 ng/mL (ref 0.3–4.0)
Total CK: 36 U/L (ref 7–232)
Total CK: 25 U/L (ref 7–232)
Troponin I: <0.3 ng/mL (ref <0.30)

## 2010-09-22 LAB — VALPROIC ACID LEVEL: Valproic Acid Lvl: 57.1 ug/mL (ref 50.0–100.0)

## 2010-09-22 LAB — BODY FLUID CELL COUNT WITH DIFFERENTIAL
Eos, Fluid: 0 %
Lymphs, Fluid: 69 %
Monocyte-Macrophage-Serous Fluid: 30 % — ABNORMAL LOW (ref 50–90)

## 2010-09-22 LAB — TSH: TSH: 1.63 u[IU]/mL (ref 0.350–4.500)

## 2010-09-22 LAB — COMPREHENSIVE METABOLIC PANEL
ALT: 7 U/L (ref 0–53)
AST: 16 U/L (ref 0–37)
Albumin: 2.2 g/dL — ABNORMAL LOW (ref 3.5–5.2)
Alkaline Phosphatase: 83 U/L (ref 39–117)
BUN: 24 mg/dL — ABNORMAL HIGH (ref 6–23)
CO2: 28 mEq/L (ref 19–32)
Calcium: 8.8 mg/dL (ref 8.4–10.5)
Chloride: 98 mEq/L (ref 96–112)
Creatinine, Ser: 1.64 mg/dL — ABNORMAL HIGH (ref 0.50–1.35)
GFR calc Af Amer: 54 mL/min — ABNORMAL LOW (ref >60)
GFR calc non Af Amer: 41 mL/min — ABNORMAL LOW (ref >60)
Potassium: 3.3 mEq/L — ABNORMAL LOW (ref 3.5–5.1)
Sodium: 135 mEq/L (ref 135–145)
Total Bilirubin: 0.6 mg/dL (ref 0.3–1.2)
Total Protein: 5.4 g/dL — ABNORMAL LOW (ref 6.0–8.3)

## 2010-09-22 LAB — PROTEIN, BODY FLUID: Total protein, fluid: 1.2 g/dL

## 2010-09-22 LAB — MAGNESIUM: Magnesium: 1.7 mg/dL (ref 1.5–2.5)

## 2010-09-22 LAB — ALBUMIN, FLUID (OTHER): Albumin, Fluid: 0.6 g/dL

## 2010-09-22 LAB — LIPASE, BLOOD: Lipase: 17 U/L (ref 11–59)

## 2010-09-22 LAB — PROTIME-INR: INR: 1.29 (ref 0.00–1.49)

## 2010-09-22 LAB — AMMONIA: Ammonia: 26 umol/L (ref 11–60)

## 2010-09-22 NOTE — H&P (Signed)
NAMEDAIMIAN, SUDBERRY NO.:  [id number]  MEDICAL RECORD NO.:  [id number]  LOCATION:  [facility]                         FACILITY:  [facility]  PHYSICIAN:  Eduard Clos, MDDATE OF BIRTH:  29-May-1960  DATE OF ADMISSION:  09/21/2010 DATE OF DISCHARGE:                             HISTORY & PHYSICAL   PRIMARY CARE PHYSICIAN:  [facility].  CHIEF COMPLAINT:  Abdominal and chest discomfort.  HISTORY OF PRESENT ILLNESS:  A 50 year old male with known history of cirrhosis of liver, hepatitis C, hypothyroidism, schizoaffective disorder, post-traumatic stress disorder who states that over the last 3 months, his abdomen is increasing in size to the point that it has become uncomfortable for him.  The patient is not in acute respiratory distress at this time.  His abdomen is enlarged.  In the ER, the patient had EKG and cardiac enzymes, which were negative.  Chest x-ray showing possibility of her consolidation with atelectasis.  The patient denies any cough or phlegm.  Denies any fever or chills, dizziness, loss of consciousness.  Denies any focal deficit, headache, or visual symptoms.  The patient stated that he does not have specifically abdominal pain, but does have some discomfort due to the increasing size.  He does not have chest pain, but due to his abdominal girth site increasing, he is also feeling sometimes chest discomfort.  At this time, he is not in any acute distress.  PAST MEDICAL HISTORY: 1. History of cirrhosis of liver. 2. Hepatitis C. 3. History of schizoaffective disorder. 4. Post-traumatic stress disorder. 5. History of gout. 6. Hypothyroidism. 7. 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 8. History of hypertension. 9. History of chronic lower extremity edema.  MEDICATIONS PRIOR TO ADMISSION: 1. Risperidone 3 mg p.o. 2 tablets every morning. 2. Depakote 500 mg 2 tablets daily at bedtime. 3. Levothyroxine 24 mcg half tablet daily. 4. Risperdal 4 mg  p.o. daily. 5. Doxycycline 100 mg p.o. twice daily, the patient does not recall     exactly why he is taking doxycycline. 6. Ranitidine 150 mg p.o. daily. 7. Trihexyphenidyl 5 mg three times daily. 8. Omeprazole 20 daily. 9. Nephro-Vite. 10.Vitamin D.  ALLERGIES:  PENICILLIN, IMIPRAMINE, LAMOTRIGINE, HEPARIN which has caused HIT.  PAST SURGICAL HISTORY:  History of cervical fusion and thoracentesis.  FAMILY HISTORY:  Father had coronary artery disease and had bypass.  SOCIAL HISTORY:  The patient at this time lives with his parents. Denies smoking cigarettes, drinking alcohol, or using illegal drugs. Ambulatory.  REVIEW OF SYSTEMS:  As per history of present illness, nothing else significant.  PHYSICAL EXAMINATION:  GENERAL:  The patient examined at bedside not in acute distress. VITAL SIGNS:  Blood pressure 140/78, pulse is 116 per minute, temperature 97.4, respirations 22 per minute, O2 sats 94%. HEENT:  Anicteric.  No pallor.  No discharge from ears, eyes, nose, or mouth. CHEST:  Bilateral air entry present.  No rhonchi, no crepitation. HEART:  S1 and S2 heard. ABDOMEN:  Massively distended.  I am not clearly able to auscultate any bowel sounds, but abdomen is nontender.  No guarding or rigidity. CNS:  The patient is alert, awake, and oriented to time, place, and person.  Moves upper and lower extremities. EXTREMITIES:  There is 3+ edema extending up to his abdomen.  LABORATORY DATA:  CBC; WBC 3.4, hemoglobin is 12.2, hematocrit is 35.7, platelets 86.  PT/INR is 16.3 and 1.29.  Complete metabolic panel; sodium 135, potassium 3, chloride 98, carbon dioxide 28, glucose 96, BUN 24, creatinine 1.7, total bilirubin is 0.6, alkaline phosphatase 83, AST 60, ALT 7, total protein 5.6, albumin 2.3, calcium 9, ammonia is 26.  CK is 26, MB 1.4, troponin less than 0.3, BNP is 180.8.  ASSESSMENT: 1. Massive ascites with history of cirrhosis of liver and hepatitis C. 2. Abdominal  and chest discomfort secondary to #1. 3. Chronic lower extremity edema. 4. History of schizoaffective disorder and post-traumatic stress     border. 5. Pancytopenia. 6. History of hypothyroidism.  PLAN: 1. At this time, admit the patient to telemetry. 2. For his massive ascites, at this time the patient will require     paracentesis for which I have ordered sonogram guided.  The patient     will need therapeutic paracentesis.  At this time it is also     concerning that the patient may need to be on empiric coverage for     his BP.  I am going to place the patient on Levaquin as the patient     is allergic to penicillin.  We will send paracentesis fluid for     cell count differential, Gram-stain culture along with LDH, total     protein, albumin, glucose, and cytology.  The patient eventually     will need to be on a diuretic, which the patient is not on.  The     patient also has lower extremity edema going through the chart.     The patient does have this edema chronically.  At this time, I will     order Doppler of the lower extremity to make sure there is no DVT. 3. Chest discomfort and abdominal discomfort is  from his massive ascites.  We will cycle cardiac markers and the patient will be on     empiric antibiotics for his BP.  We will also order a sonogram of     the abdomen to look     into his liver. 4. The patient probably will benefit from a GI consult, which can be     done in the a.m. 5. At this time also we will check a Depakote level.     Eduard Clos, MD     ANK/MEDQ  D:  09/22/2010  T:  09/22/2010  Job:  [id number]  Electronically Signed by Midge Minium MD on 09/22/2010 06:18:26 AM

## 2010-09-23 DIAGNOSIS — M7989 Other specified soft tissue disorders: Secondary | ICD-10-CM

## 2010-09-23 LAB — CBC
HCT: 30.7 % — ABNORMAL LOW (ref 39.0–52.0)
Hemoglobin: 10.1 g/dL — ABNORMAL LOW (ref 13.0–17.0)
MCHC: 32.9 g/dL (ref 30.0–36.0)
MCV: 91.9 fL (ref 78.0–100.0)
RDW: 16.2 % — ABNORMAL HIGH (ref 11.5–15.5)
WBC: 3 10*3/uL — ABNORMAL LOW (ref 4.0–10.5)

## 2010-09-23 LAB — PATHOLOGIST SMEAR REVIEW

## 2010-09-23 LAB — COMPREHENSIVE METABOLIC PANEL
Albumin: 1.8 g/dL — ABNORMAL LOW (ref 3.5–5.2)
Alkaline Phosphatase: 68 U/L (ref 39–117)
BUN: 24 mg/dL — ABNORMAL HIGH (ref 6–23)
Chloride: 103 mEq/L (ref 96–112)
Creatinine, Ser: 1.59 mg/dL — ABNORMAL HIGH (ref 0.50–1.35)
GFR calc Af Amer: 56 mL/min — ABNORMAL LOW (ref >60)
Glucose, Bld: 75 mg/dL (ref 70–99)
Potassium: 3.7 mEq/L (ref 3.5–5.1)
Total Bilirubin: 0.4 mg/dL (ref 0.3–1.2)

## 2010-09-23 LAB — GLUCOSE, CAPILLARY: Glucose-Capillary: 81 mg/dL (ref 70–99)

## 2010-09-24 LAB — BASIC METABOLIC PANEL
BUN: 24 mg/dL — ABNORMAL HIGH (ref 6–23)
CO2: 30 mEq/L (ref 19–32)
Calcium: 8.1 mg/dL — ABNORMAL LOW (ref 8.4–10.5)
Creatinine, Ser: 1.47 mg/dL — ABNORMAL HIGH (ref 0.50–1.35)
GFR calc non Af Amer: 51 mL/min — ABNORMAL LOW (ref >60)
Glucose, Bld: 82 mg/dL (ref 70–99)

## 2010-09-24 LAB — URINALYSIS, ROUTINE W REFLEX MICROSCOPIC
Bilirubin Urine: NEGATIVE
Glucose, UA: NEGATIVE mg/dL
Hgb urine dipstick: NEGATIVE
Ketones, ur: NEGATIVE mg/dL
Protein, ur: NEGATIVE mg/dL
Urobilinogen, UA: 0.2 mg/dL (ref 0.0–1.0)

## 2010-09-25 ENCOUNTER — Inpatient Hospital Stay (HOSPITAL_COMMUNITY): Payer: Medicaid Other

## 2010-09-25 DIAGNOSIS — F259 Schizoaffective disorder, unspecified: Secondary | ICD-10-CM

## 2010-09-25 LAB — RENAL FUNCTION PANEL
Albumin: 2 g/dL — ABNORMAL LOW (ref 3.5–5.2)
BUN: 26 mg/dL — ABNORMAL HIGH (ref 6–23)
Calcium: 9.1 mg/dL (ref 8.4–10.5)
Chloride: 103 mEq/L (ref 96–112)
Creatinine, Ser: 1.48 mg/dL — ABNORMAL HIGH (ref 0.50–1.35)

## 2010-09-25 LAB — DIFFERENTIAL
Eosinophils Absolute: 0.1 10*3/uL (ref 0.0–0.7)
Eosinophils Relative: 3 % (ref 0–5)
Lymphocytes Relative: 44 % (ref 12–46)
Lymphs Abs: 1.2 10*3/uL (ref 0.7–4.0)
Monocytes Absolute: 0.3 10*3/uL (ref 0.1–1.0)
Monocytes Relative: 9 % (ref 3–12)

## 2010-09-25 LAB — CBC
HCT: 32.9 % — ABNORMAL LOW (ref 39.0–52.0)
MCH: 31.5 pg (ref 26.0–34.0)
MCV: 92.4 fL (ref 78.0–100.0)
RDW: 16.1 % — ABNORMAL HIGH (ref 11.5–15.5)
WBC: 2.8 10*3/uL — ABNORMAL LOW (ref 4.0–10.5)

## 2010-09-25 NOTE — H&P (Signed)
  NAMEBRYSAN, MCEVOY NO.:  [id number]  MEDICAL RECORD NO.:  [id number]  LOCATION:                                 FACILITY:  PHYSICIAN:  Eduard Clos, MDDATE OF BIRTH:  [birth year]  DATE OF ADMISSION: DATE OF DISCHARGE:                             HISTORY & PHYSICAL   ADDENDUM  In addition, the patient's x-ray also was showing some possibility of consolidation versus atelectasis.  The patient does not have any obvious cough or phlegm or fever or chills.  The patient is going to be on Levaquin for his possibility of his BP coverage which also helps him if he has any possible pneumonia. The patient also has renal failure.  His creatinine has been 1.7 when I checked last time.  At this time, we have to closely observe his intake and output and daily weights, closely follow his BMET as the patient does have cirrhosis of liver to make sure the patient is not going into any hepatorenal syndrome.     Eduard Clos, MD     ANK/MEDQ  D:  09/22/2010  T:  09/22/2010  Job:  [id number]  Electronically Signed by Midge Minium MD on 09/25/2010 12:27:12 AM

## 2010-09-26 LAB — BODY FLUID CULTURE

## 2010-09-26 LAB — RENAL FUNCTION PANEL
BUN: 25 mg/dL — ABNORMAL HIGH (ref 6–23)
Chloride: 103 mEq/L (ref 96–112)
Creatinine, Ser: 1.58 mg/dL — ABNORMAL HIGH (ref 0.50–1.35)
Glucose, Bld: 84 mg/dL (ref 70–99)
Phosphorus: 3.4 mg/dL (ref 2.3–4.6)
Potassium: 3.4 mEq/L — ABNORMAL LOW (ref 3.5–5.1)

## 2010-09-27 LAB — BASIC METABOLIC PANEL
BUN: 26 mg/dL — ABNORMAL HIGH (ref 6–23)
CO2: 31 mEq/L (ref 19–32)
Calcium: 9.1 mg/dL (ref 8.4–10.5)
Creatinine, Ser: 1.58 mg/dL — ABNORMAL HIGH (ref 0.50–1.35)
GFR calc Af Amer: 56 mL/min — ABNORMAL LOW (ref >60)
GFR calc non Af Amer: 47 mL/min — ABNORMAL LOW (ref >60)
Glucose, Bld: 71 mg/dL (ref 70–99)
Potassium: 3.5 mEq/L (ref 3.5–5.1)
Sodium: 139 mEq/L (ref 135–145)

## 2010-09-28 LAB — BASIC METABOLIC PANEL
Calcium: 9.2 mg/dL (ref 8.4–10.5)
Creatinine, Ser: 1.59 mg/dL — ABNORMAL HIGH (ref 0.50–1.35)
GFR calc Af Amer: 56 mL/min — ABNORMAL LOW (ref >60)
GFR calc non Af Amer: 46 mL/min — ABNORMAL LOW (ref >60)
Sodium: 140 mEq/L (ref 135–145)

## 2010-09-28 LAB — GLUCOSE, CAPILLARY: Glucose-Capillary: 94 mg/dL (ref 70–99)

## 2010-09-29 LAB — COMPREHENSIVE METABOLIC PANEL
CO2: 33 mEq/L — ABNORMAL HIGH (ref 19–32)
Calcium: 9.1 mg/dL (ref 8.4–10.5)
Chloride: 99 mEq/L (ref 96–112)
Creatinine, Ser: 1.64 mg/dL — ABNORMAL HIGH (ref 0.50–1.35)
GFR calc Af Amer: 54 mL/min — ABNORMAL LOW (ref >60)
GFR calc non Af Amer: 45 mL/min — ABNORMAL LOW (ref >60)
Glucose, Bld: 78 mg/dL (ref 70–99)
Total Bilirubin: 0.6 mg/dL (ref 0.3–1.2)

## 2010-09-29 LAB — GLUCOSE, CAPILLARY

## 2010-09-30 LAB — CBC
Hemoglobin: 10.1 g/dL — ABNORMAL LOW (ref 13.0–17.0)
MCH: 31 pg (ref 26.0–34.0)
MCHC: 34 g/dL (ref 30.0–36.0)
MCV: 91.1 fL (ref 78.0–100.0)
Platelets: 63 10*3/uL — ABNORMAL LOW (ref 150–400)
RBC: 3.26 MIL/uL — ABNORMAL LOW (ref 4.22–5.81)

## 2010-09-30 LAB — VITAMIN D 25 HYDROXY (VIT D DEFICIENCY, FRACTURES): Vit D, 25-Hydroxy: 18 ng/mL — ABNORMAL LOW (ref 30–89)

## 2010-09-30 LAB — RENAL FUNCTION PANEL
CO2: 35 mEq/L — ABNORMAL HIGH (ref 19–32)
Calcium: 9.2 mg/dL (ref 8.4–10.5)
Creatinine, Ser: 1.75 mg/dL — ABNORMAL HIGH (ref 0.50–1.35)
GFR calc Af Amer: 50 mL/min — ABNORMAL LOW (ref >60)
Glucose, Bld: 74 mg/dL (ref 70–99)
Phosphorus: 3.5 mg/dL (ref 2.3–4.6)
Sodium: 137 mEq/L (ref 135–145)

## 2010-09-30 LAB — C3 COMPLEMENT: C3 Complement: 57 mg/dL — ABNORMAL LOW (ref 90–180)

## 2010-10-01 ENCOUNTER — Inpatient Hospital Stay (HOSPITAL_COMMUNITY): Payer: Medicaid Other

## 2010-10-01 LAB — CBC
HCT: 32 % — ABNORMAL LOW (ref 39.0–52.0)
Hemoglobin: 10.8 g/dL — ABNORMAL LOW (ref 13.0–17.0)
MCV: 91.2 fL (ref 78.0–100.0)
WBC: 2.5 10*3/uL — ABNORMAL LOW (ref 4.0–10.5)

## 2010-10-01 LAB — KAPPA/LAMBDA LIGHT CHAINS
Kappa free light chain: 6.41 mg/dL — ABNORMAL HIGH (ref 0.33–1.94)
Kappa, lambda light chain ratio: 0.79 (ref 0.26–1.65)
Lambda free light chains: 8.11 mg/dL — ABNORMAL HIGH (ref 0.57–2.63)

## 2010-10-01 LAB — RENAL FUNCTION PANEL
BUN: 20 mg/dL (ref 6–23)
CO2: 33 mEq/L — ABNORMAL HIGH (ref 19–32)
Chloride: 97 mEq/L (ref 96–112)
Creatinine, Ser: 1.7 mg/dL — ABNORMAL HIGH (ref 0.50–1.35)
Glucose, Bld: 84 mg/dL (ref 70–99)
Potassium: 3.9 mEq/L (ref 3.5–5.1)

## 2010-10-01 LAB — CREATININE CLEARANCE, URINE, 24 HOUR
Collection Interval-CRCL: 24 hours
Creatinine, 24H Ur: 1835 mg/d (ref 800–2000)
Creatinine, Urine: 43.17 mg/dL
Creatinine: 1.7 mg/dL — ABNORMAL HIGH (ref 0.50–1.35)
Urine Total Volume-CRCL: 4250 mL

## 2010-10-01 LAB — MAGNESIUM: Magnesium: 1.7 mg/dL (ref 1.5–2.5)

## 2010-10-01 LAB — PROTEIN, URINE, 24 HOUR: Collection Interval-UPROT: 24 hours

## 2010-10-02 LAB — PROTEIN ELECTROPHORESIS, SERUM
Albumin ELP: 53.7 % — ABNORMAL LOW (ref 55.8–66.1)
Alpha-1-Globulin: 5.1 % — ABNORMAL HIGH (ref 2.9–4.9)
Beta 2: 3.5 % (ref 3.2–6.5)
Beta Globulin: 3.8 % — ABNORMAL LOW (ref 4.7–7.2)
Gamma Globulin: 26.8 % — ABNORMAL HIGH (ref 11.1–18.8)

## 2010-10-02 LAB — BASIC METABOLIC PANEL
BUN: 19 mg/dL (ref 6–23)
CO2: 35 mEq/L — ABNORMAL HIGH (ref 19–32)
Calcium: 9.1 mg/dL (ref 8.4–10.5)
Chloride: 98 mEq/L (ref 96–112)
Creatinine, Ser: 1.76 mg/dL — ABNORMAL HIGH (ref 0.50–1.35)

## 2010-10-02 LAB — PROTIME-INR
INR: 1.39 (ref 0.00–1.49)
Prothrombin Time: 17.3 seconds — ABNORMAL HIGH (ref 11.6–15.2)

## 2010-10-02 LAB — PTH, INTACT AND CALCIUM: PTH: 33.8 pg/mL (ref 14.0–72.0)

## 2010-10-03 LAB — RENAL FUNCTION PANEL
BUN: 16 mg/dL (ref 6–23)
CO2: 36 mEq/L — ABNORMAL HIGH (ref 19–32)
Calcium: 9.1 mg/dL (ref 8.4–10.5)
Chloride: 98 mEq/L (ref 96–112)
Creatinine, Ser: 1.65 mg/dL — ABNORMAL HIGH (ref 0.50–1.35)
Glucose, Bld: 80 mg/dL (ref 70–99)

## 2010-10-03 NOTE — Consult Note (Signed)
[name], [name] NO.:  [id number]  MEDICAL RECORD NO.:  [id number]  LOCATION:  2021                         FACILITY:  [facility]  PHYSICIAN:  Shirley Friar, MDDATE OF BIRTH:  [birth year]  DATE OF CONSULTATION: DATE OF DISCHARGE:                                CONSULTATION   REASON FOR CONSULT:  Ascites.  REFERRING PHYSICIAN:  Dr. Isidoro Donning.  HISTORY OF PRESENT ILLNESS:  Mr. Basnett is an unassigned white male originally from [place] who has known hepatitis C and cirrhosis along with schizoaffective disorder, post-traumatic stress disorder and hypothyroidism.  He was admitted on September 21, 2010, due to worsening ascites over the last 3 months causing discomfort.  His abdominal distention is causing some chest discomfort and upper abdominal discomfort on admission.  He underwent an ultrasound- guided paracentesis where a max of 2 liters was recommended and 2 liters was taken which was clear straw-colored fluid, and the fluid studies revealed a SAAG of 1.6 with a total protein of 1.2.  PAST MEDICAL HISTORY: 1. Hepatitis C cirrhosis. 2. Post-traumatic stress disorder. 3. History of schizoaffective disorder. 4. History of gout. 5. Hypothyroidism. 6. Diabetes insipidus. 7. Hypertension. 8. History of chronic lower extremity edema.  MEDICATIONS ON ADMISSION:  Risperidone, Depakote, levothyroxine, Risperdal, doxycycline, ranitidine, trihexyphenidyl, omeprazole, Nephro- Vite, vitamin D and doses listed in hospital record.  ALLERGIES:  PENICILLIN, IMIPRAMINE, LAMOTRIGINE, HEPARIN.  PAST SURGICAL HISTORY//FAMILY HISTORY AND SOCIAL HISTORY/REVIEW OF SYSTEMS:  Reviewed on admission this dictation by Dr. Toniann Fail on September 22, 2010, and I agree.  PHYSICAL EXAMINATION:  VITAL SIGNS:  Temperature 97, pulse 98, blood pressure 122/91. GENERAL:  Alert, no acute distress. ABDOMEN:  Significant distention, nontender, no guarding.  Bowel sounds not  appreciated. EXTREMITIES:  3+ pitting edema bilaterally.  LABORATORY DATA:  White blood count 3.0, hemoglobin 11.7, platelet count 90.  INR 1.29, BUN 24, creatinine 1.64, T bili 0.6, ALP 81, AST 14, ALT 6, lipase 17.  IMPRESSION:  A 50 year old white male reportedly from [place] with massive ascites and paracentesis consistent with portal hypertension as a source of his ascites.  His ultrasound did not show any focal liver lesions.  There is no evidence of spontaneous bacterial peritonitis on his ascitic fluid.  His ascites is due to his cirrhosis, but will need to do an alpha fetoprotein level to see if there is any suggestion of hepatocellular carcinoma.  His renal insufficiency precludes a triple phase CT scan to further evaluate his liver at this time.  Treatment of ascites will also be difficult due to his renal insufficiency.  If his creatinine is worsening we will need input from Renal, otherwise, would repeat paracentesis for therapeutic purposes for maximum of 4 liters with IV albumin giving 25 g every 2 liters removed.  We will hold off on a therapeutic paracentesis tomorrow and recommend doing it on Thursday if his creatinine continues to improve.  We will follow along with you.  Thank you for this consultation.     Shirley Friar, MD     VCS/MEDQ  D:  09/22/2010  T:  09/23/2010  Job:  [id number]  Electronically Signed by Charlott Rakes MD on 10/03/2010  03:19:29 PM

## 2010-10-04 LAB — COMPREHENSIVE METABOLIC PANEL
AST: 19 U/L (ref 0–37)
BUN: 14 mg/dL (ref 6–23)
CO2: 35 mEq/L — ABNORMAL HIGH (ref 19–32)
Calcium: 9.1 mg/dL (ref 8.4–10.5)
Chloride: 94 mEq/L — ABNORMAL LOW (ref 96–112)
Creatinine, Ser: 1.69 mg/dL — ABNORMAL HIGH (ref 0.50–1.35)
GFR calc Af Amer: 52 mL/min — ABNORMAL LOW (ref >60)
GFR calc non Af Amer: 43 mL/min — ABNORMAL LOW (ref >60)
Glucose, Bld: 81 mg/dL (ref 70–99)
Total Bilirubin: 0.5 mg/dL (ref 0.3–1.2)

## 2010-10-04 LAB — CBC
HCT: 29.3 % — ABNORMAL LOW (ref 39.0–52.0)
Hemoglobin: 10.2 g/dL — ABNORMAL LOW (ref 13.0–17.0)
MCH: 31.5 pg (ref 26.0–34.0)
MCV: 90.4 fL (ref 78.0–100.0)
Platelets: 56 10*3/uL — ABNORMAL LOW (ref 150–400)
RBC: 3.24 MIL/uL — ABNORMAL LOW (ref 4.22–5.81)
WBC: 2.3 10*3/uL — ABNORMAL LOW (ref 4.0–10.5)

## 2010-10-05 NOTE — H&P (Signed)
[name], [name] NO.:  [id number]  MEDICAL RECORD NO.:  [id number]           PATIENT TYPE:  O  LOCATION:  XRAY                         FACILITY:  [facility]  PHYSICIAN:  Isidor Holts, M.D.  DATE OF BIRTH:  May 24, 1960  DATE OF ADMISSION:  06/22/2010 DATE OF DISCHARGE:                             HISTORY & PHYSICAL   PRIMARY CARE PHYSICIAN:  Dr. Leone Haven.  The patient is  unassigned to [place].  CHIEF COMPLAINT:  Progressive weakness, perineal infection, abnormal CBC.  HISTORY OF PRESENT ILLNESS:  This is a 50 year old male, who was originally hospitalized June 11, 2010 in the TCU unit for psychiatric issues, including schizoaffective disorder, PTSD as well as psychosocial issues, awaiting a bed at [facility].  In that period of time, he has been under the care of the ED team with Psychiatrist consulting, following and managing his psychotropic medications.  It appears that the patient has developed medical issues, including pancytopenia.  He has a spontaneously draining perineal abscess and has become deconditioned.  Dr.  Denton Lank, ED MD, therefore contacted the medical service today and has requested [place] to admit the patient for stabilization.  PAST MEDICAL HISTORY: 1. Schizoaffective disorder, bipolar type. 2. PTSD. 3. Gout. 4. Hypothyroidism. 5. Lithium-induced diabetes insipidus. 6. History of drug overdose, complicated by ARDS/VDRF, November 2011. 7. History of C. difficile colitis, November 2001. 8. Questionable history of hepatitis C. 9. Hypertension. 10.History of VRE UTI. 11.History of cervical/occipital fusion surgery. 12.Status post right hip fracture, 1990. 13.Chronic renal insufficiency (baseline creatinine 1.35). 14.Smoking history.  ALLERGIES:  PENICILLIN, IMIPRAMINE, LAMICTAL, HEPARIN (SECONDARY TO HIT ANTIBODIES).  MEDICATION HISTORY: 1. Artane 5 mg p.o. daily. 2. Levothroid 25 mcg p.o.  q.a.m. 3. Multivitamin 1 p.o. daily. 4. Geodon 80 mg p.o. in the evening. 5. Folic acid 1 mg p.o. q.a.m. 6. Zoloft 50 mg p.o. q.a.m.  REVIEW OF SYSTEMS:  The patient denies fever or chills.  Denies abdominal pain, vomiting or diarrhea.  Denies cough or shortness of breath.  He has experienced right knee pain over the past few days. Rest of systems review is negative.  SOCIAL HISTORY:  The patient is originally from [place], has been in the [place] for over a decade and is a [place] citizen.  Nondrinker.  Has no history of drug abuse.  Currently resides with his mother.  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  FAMILY HISTORY:  Unobtainable at this time.  PHYSICAL EXAMINATION:  VITAL SIGNS:  Temperature 97.8, pulse 91 per minute and regular, respiratory rate 20, BP 109/72 mmHg, pulse oximetry 93% on room air. GENERAL:  The patient appears to be in no obvious acute distress at the time of this evaluation, was communicative and not short of breath at rest. HEENT:  No clinical pallor.  No jaundice.  No conjunctival injection. Hydration status appears fair. NECK:  Supple.  JVP not seen. CHEST:  Clinically clear to auscultation.  No wheezes.  No crackles. HEART:  Heart sounds S1 and S2 are heard and normal.  Regular. No murmurs. ABDOMEN:  Appears markedly  distended.  A fluid thrill is appreciated, as well as shifting dullness.  There are dilated superficial veins. EXTREMITIES:  Lower extremity examination, the patient has bilateral moderate pitting edema. MUSCULOSKELETAL:  The patient has what appears to be decubitus ulcer in the volar aspect of the left great toe.  He also has mild osteoarthritic changes.  Right knee has mild effusion, mildly tender to palpation.  The patient has mildly restricted range of movement, particularly to flexion.  Rest of musculoskeletal system appears unremarkable. CENTRAL NERVOUS SYSTEM:  No focal neurologic deficit on  gross examination. RECTAL:  Examination of the patient's perineal region demonstrates redness, tenderness and mild swelling and induration.  INVESTIGATIONS:  CBC on June 23, 2010 shows WBC of 3.7, hemoglobin 9.9, hematocrit 30.8, platelets 100,000 (CBC of June 11, 2010 shows WBC of 8.2, hemoglobin 13.6, hematocrit 40.8, platelets 159,000). Electrolytes, June 23, 2010 showed sodium 143, potassium 3.8, chloride 110, CO2 of 28, BUN 12, creatinine 1.41, glucose 104.  Alkaline phosphatase is 55, AST 17, ALT 11, calcium is 9.0, albumin 2.4. Urinalysis was negative on June 18, 2010.  Chest x-ray June 22, 2010 shows stable bibasilar atelectasis with some scarring with no acute cardiopulmonary findings.  ASSESSMENT AND PLAN: 1. Psychiatric issues.  The patient has schizoaffective disorder,     bipolar type.  This was original reason for hospitalization.  He is     currently awaiting a bed at [facility]. We shall manage per psychiatric recommendations.  The     patient is currently being followed by Dr. Eulogio Ditch.  2. Mild pancytopenia.  The patient has been seen in consultation by     Dr. Arlan Organ in the a.m. of June 23, 2010 and he has opined that     this is considered mild and likely secondary to Risperdal as the     most likely culprit medication.  He, however, does not feel that     this is likely to prove problematic.  3. Clinical ascites.  Clinically, the patient appears to have ascites     with dilated superficial veins, abdominal distention, shifting     dullness.  He does have a questionable history of     hepatitis C.  We will therefore do abdominal CT scan to rule out     hepatic cirrhosis.  Check ammonia level, do hepatitis B and     C screen, HIV test and RPR. Should ascites and portal     hypertension/cirrhosis be confirmed, we shall institute more     specific treatment and possibly consider GI consultation.  4.  Right knee pain with small effusion.  Most likely secondary to flare-     up of gout.  In view of chronic renal insufficiency, we shall avoid     nonsteroidal anti-inflammatory drugs and manage with a short course     of steroids.  5. Hypothyroidism.  The patient is currently on replacement treatment,     we shall continue and check TSH for completeness.  6. Hypertension.  This is controlled.  7. Perineal cellulitis.  The patient was commenced this a.m. on     doxycycline which we shall continue. At the time of this dictation, Dr Denton Lank, ED MD, was attempting an I & D.  8. Deconditioning.  We shall request physical therapy and occupational     therapy to assist in management.  9. Bilateral lower extremity edema.  This is likely secondary  to     hypoalbuminemia and possible chronic liver disease.  We shall,     however, for completeness do bilateral venous Dopplers to rule out     possible deep venous thrombosis.  10.Wound, right big toe.  This appears to be a decubitus.  It does not     appear overtly infected.  However, we shall invite the Wound     Management Care Team to participate in care and make     recommendations.    Further management will depend on clinical course.     Isidor Holts, M.D.     CO/MEDQ  D:  06/23/2010  T:  06/23/2010  Job:  [id number]  cc:   Leone Haven Fax: [phone]  Electronically Signed by Isidor Holts M.D. on 06/24/2010 12:47:36 PM

## 2010-10-12 NOTE — Discharge Summary (Signed)
[name], [name] NO.:  [id number]  MEDICAL RECORD NO.:  [id number]  LOCATION:  5008                         FACILITY:  [facility]  PHYSICIAN:  Hisashi Amadon, DO         DATE OF BIRTH:  [birth year]  DATE OF ADMISSION:  09/21/2010 DATE OF DISCHARGE:  10/04/2010                              DISCHARGE SUMMARY   ADMISSION DIAGNOSES:  Included: 1. Massive ascites. 2. End-stage liver disease. 3. Abdominal and chest discomfort secondary to massive ascites. 4. Chronic lower extremity edema. 5. Schizoaffective disorder. 6. Posttraumatic stress disorder. 7. Pancytopenia. 8. Hypothyroidism.  HISTORY OF PRESENT ILLNESS:  Please see H and P.  HOSPITAL COURSE:  The patient was admitted to telemetry.  A sonogram- guided paracentesis was ordered.  2 liters was initially taken off. Studies revealed no SBP.  His creatinine worsened as expected following paracentesis.  The patient continued to have chest pain that was associated with his ascites.  His creatinine gradually improved. Psychiatry was consulted and he was diagnosed with schizoaffective disorder.  Paracentesis was again performed on September 25, 2010, and 4 liters of fluid was removed.  Consideration was given to TIPS.  The patient was discussed with [facility].  For the current time, the patient would remain here and follow up with GI here is resulted at discussion. The patient was placed on Aldactone and Lasix with the ratio of 2.5:1. The patient was found to have protein-calorie malnutrition.  He was given nutritional supplements.  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  Renal does not feel that this represents hepatorenal syndrome.  Dr. Larey Dresser at [facility] has agreed to see the patient as outpatient. The patient had 4 liters of ascites drained on October 02, 2010.  On October 03, 2010, the patient had marked hypotension with systolics in the 70s before he received his Lasix.  His Lasix was held on this date and his spirolactone was decreased.  Today, the patient's blood pressures are in the mid to high 90s.  He is feeling well and he is appropriate for discharge.  DISCHARGE DIAGNOSES:  Include: 1. End-stage liver disease. 2. Ascites. 3. Hypertension. 4. Chronic renal insufficiency. 5. Contraction alkalosis. 6. Pancytopenia. 7. Schizoaffective disorder. 8. Protein-calorie malnutrition. 9. History of hypertension, most recently hypotensive.  DISCHARGE MEDICATIONS:  Include: 1. Lasix 20 mg 2 tablets by mouth on Monday, Wednesday, and Friday;     one by mouth, Saturday, Sunday, Tuesday, and Thursday. 2. Meclizine 25 mg 1 p.o. every 8 hours as needed for dizziness. 3. Nicotine patch #21 one transdermal daily.  The patient is not to     use this if he is smoking. 4. Nutritional supplement liquid 240 mL, Resource Wild Berry liquid     24 0 mL by mouth 3 times daily. 5. Oxycodone 5 mg immediate release 1 tablet by mouth every 4 hours as     needed for pain. 6. Spirolactone 100 mg 1 p.o. daily. 7. Divalproex extended release 500 mg 2 by mouth daily at bedtime. 8. Doxycycline 100  mg 1 p.o. b.i.d. 9. Synthroid 25 mcg 1/2 tablet one p.o. daily. 10.Nephro-Vite 1 tablet p.o. every morning. 11.Omeprazole 20 mg 1 p.o. daily. 12.Ranitidine 100 mg 1 p.o. every morning. 13.Risperdal 2 mg 1 by mouth every morning, Risperdal 3 mg 2 by mouth     every evening. 14.Trihexyphenidyl 5 mg 1 p.o. 3 times daily. 15.Vitamin D2 400 units 2 tablets by mouth every morning.  The patient is to have a low-sodium diet.  Activity is as tolerated.  He is discharged to the care of his family.  He is to follow up with [facility] in 2-4 weeks.  He is to follow with [facility] in 3 months.  He is to follow up with Dr. Larey Dresser at [facility] within 2 weeks and Dr. Bosie Clos  in 4 weeks.  I spent 39 minutes on this discharge.          ______________________________ Fran Lowes, DO     AS/MEDQ  D:  10/04/2010  T:  10/04/2010  Job:  [id number]  cc:   Clinic [facility] [place] Dr. Radonna Ricker, MD [facility]  Electronically Signed by Fran Lowes DO on 10/12/2010 01:38:58 PM

## 2010-10-26 ENCOUNTER — Inpatient Hospital Stay (HOSPITAL_COMMUNITY)
Admission: EM | Admit: 2010-10-26 | Discharge: 2010-11-03 | DRG: 100 | Disposition: A | Discharge status: Hospice | Payer: Medicaid Other | Attending: Internal Medicine | Admitting: Internal Medicine

## 2010-10-26 DIAGNOSIS — G40909 Epilepsy, unspecified, not intractable, without status epilepticus: Principal | ICD-10-CM | POA: Diagnosis present

## 2010-10-26 DIAGNOSIS — N179 Acute kidney failure, unspecified: Secondary | ICD-10-CM | POA: Diagnosis present

## 2010-10-26 DIAGNOSIS — R55 Syncope and collapse: Secondary | ICD-10-CM | POA: Diagnosis present

## 2010-10-26 DIAGNOSIS — J329 Chronic sinusitis, unspecified: Secondary | ICD-10-CM | POA: Diagnosis present

## 2010-10-26 DIAGNOSIS — F209 Schizophrenia, unspecified: Secondary | ICD-10-CM | POA: Diagnosis present

## 2010-10-26 DIAGNOSIS — L02219 Cutaneous abscess of trunk, unspecified: Secondary | ICD-10-CM | POA: Diagnosis present

## 2010-10-26 DIAGNOSIS — B1920 Unspecified viral hepatitis C without hepatic coma: Secondary | ICD-10-CM | POA: Diagnosis present

## 2010-10-26 DIAGNOSIS — N183 Chronic kidney disease, stage 3 unspecified: Secondary | ICD-10-CM | POA: Diagnosis present

## 2010-10-26 DIAGNOSIS — F4310 Post-traumatic stress disorder, unspecified: Secondary | ICD-10-CM | POA: Diagnosis present

## 2010-10-26 DIAGNOSIS — D61818 Other pancytopenia: Secondary | ICD-10-CM | POA: Diagnosis present

## 2010-10-26 DIAGNOSIS — D7389 Other diseases of spleen: Secondary | ICD-10-CM | POA: Diagnosis present

## 2010-10-26 DIAGNOSIS — R188 Other ascites: Secondary | ICD-10-CM | POA: Diagnosis not present

## 2010-10-26 DIAGNOSIS — E039 Hypothyroidism, unspecified: Secondary | ICD-10-CM | POA: Diagnosis present

## 2010-10-26 DIAGNOSIS — D57819 Other sickle-cell disorders with crisis, unspecified: Secondary | ICD-10-CM | POA: Diagnosis present

## 2010-10-26 DIAGNOSIS — L03319 Cellulitis of trunk, unspecified: Secondary | ICD-10-CM | POA: Diagnosis present

## 2010-10-26 DIAGNOSIS — G9340 Encephalopathy, unspecified: Secondary | ICD-10-CM | POA: Diagnosis present

## 2010-10-26 LAB — DIFFERENTIAL
Basophils Absolute: 0 10*3/uL (ref 0.0–0.1)
Basophils Relative: 0 % (ref 0–1)
Eosinophils Absolute: 0.1 10*3/uL (ref 0.0–0.7)
Monocytes Relative: 6 % (ref 3–12)
Neutrophils Relative %: 72 % (ref 43–77)

## 2010-10-26 LAB — PROTIME-INR
INR: 1.4 (ref 0.00–1.49)
Prothrombin Time: 17.4 seconds — ABNORMAL HIGH (ref 11.6–15.2)

## 2010-10-26 LAB — CBC
MCH: 31.8 pg (ref 26.0–34.0)
MCHC: 34.7 g/dL (ref 30.0–36.0)
Platelets: 112 10*3/uL — ABNORMAL LOW (ref 150–400)
RBC: 4.12 MIL/uL — ABNORMAL LOW (ref 4.22–5.81)

## 2010-10-27 ENCOUNTER — Emergency Department (HOSPITAL_COMMUNITY): Payer: Medicaid Other

## 2010-10-27 LAB — URINALYSIS, ROUTINE W REFLEX MICROSCOPIC
Bilirubin Urine: NEGATIVE
Glucose, UA: NEGATIVE mg/dL
Hgb urine dipstick: NEGATIVE
Ketones, ur: NEGATIVE mg/dL
Protein, ur: NEGATIVE mg/dL

## 2010-10-27 LAB — BASIC METABOLIC PANEL
BUN: 14 mg/dL (ref 6–23)
Calcium: 10.1 mg/dL (ref 8.4–10.5)
Creatinine, Ser: 1.96 mg/dL — ABNORMAL HIGH (ref 0.50–1.35)
GFR calc Af Amer: 44 mL/min — ABNORMAL LOW (ref >60)
GFR calc non Af Amer: 36 mL/min — ABNORMAL LOW (ref >60)

## 2010-10-27 LAB — COMPREHENSIVE METABOLIC PANEL
Albumin: 2.5 g/dL — ABNORMAL LOW (ref 3.5–5.2)
Alkaline Phosphatase: 58 U/L (ref 39–117)
BUN: 16 mg/dL (ref 6–23)
Chloride: 105 mEq/L (ref 96–112)
Glucose, Bld: 86 mg/dL (ref 70–99)
Potassium: 3.9 mEq/L (ref 3.5–5.1)
Total Bilirubin: 1.1 mg/dL (ref 0.3–1.2)

## 2010-10-27 LAB — RAPID URINE DRUG SCREEN, HOSP PERFORMED
Amphetamines: NOT DETECTED
Opiates: NOT DETECTED
Tetrahydrocannabinol: NOT DETECTED

## 2010-10-27 LAB — HEPATIC FUNCTION PANEL
ALT: 10 U/L (ref 0–53)
Alkaline Phosphatase: 74 U/L (ref 39–117)
Bilirubin, Direct: 0.5 mg/dL — ABNORMAL HIGH (ref 0.0–0.3)
Indirect Bilirubin: 1 mg/dL — ABNORMAL HIGH (ref 0.3–0.9)

## 2010-10-27 LAB — CBC
HCT: 30.2 % — ABNORMAL LOW (ref 39.0–52.0)
Hemoglobin: 10.4 g/dL — ABNORMAL LOW (ref 13.0–17.0)
MCH: 31.5 pg (ref 26.0–34.0)
MCHC: 34.4 g/dL (ref 30.0–36.0)
MCV: 91.5 fL (ref 78.0–100.0)

## 2010-10-27 LAB — CARDIAC PANEL(CRET KIN+CKTOT+MB+TROPI): Troponin I: <0.3 ng/mL (ref <0.30)

## 2010-10-27 LAB — DIFFERENTIAL
Lymphocytes Relative: 37 % (ref 12–46)
Monocytes Absolute: 0.3 10*3/uL (ref 0.1–1.0)
Monocytes Relative: 6 % (ref 3–12)
Neutro Abs: 2.4 10*3/uL (ref 1.7–7.7)

## 2010-10-27 LAB — MAGNESIUM: Magnesium: 1.7 mg/dL (ref 1.5–2.5)

## 2010-10-28 ENCOUNTER — Inpatient Hospital Stay (HOSPITAL_COMMUNITY): Payer: Medicaid Other

## 2010-10-28 DIAGNOSIS — I059 Rheumatic mitral valve disease, unspecified: Secondary | ICD-10-CM

## 2010-10-28 DIAGNOSIS — F259 Schizoaffective disorder, unspecified: Secondary | ICD-10-CM

## 2010-10-28 LAB — CARDIAC PANEL(CRET KIN+CKTOT+MB+TROPI)
CK, MB: 1.9 ng/mL (ref 0.3–4.0)
Relative Index: INVALID (ref 0.0–2.5)
Troponin I: <0.3 ng/mL (ref <0.30)

## 2010-10-28 LAB — MAGNESIUM: Magnesium: 1.7 mg/dL (ref 1.5–2.5)

## 2010-10-28 LAB — COMPREHENSIVE METABOLIC PANEL
Alkaline Phosphatase: 61 U/L (ref 39–117)
BUN: 15 mg/dL (ref 6–23)
Chloride: 104 mEq/L (ref 96–112)
Creatinine, Ser: 1.72 mg/dL — ABNORMAL HIGH (ref 0.50–1.35)
GFR calc Af Amer: 51 mL/min — ABNORMAL LOW (ref >60)
Glucose, Bld: 88 mg/dL (ref 70–99)
Potassium: 5 mEq/L (ref 3.5–5.1)
Total Bilirubin: 1.2 mg/dL (ref 0.3–1.2)
Total Protein: 5.9 g/dL — ABNORMAL LOW (ref 6.0–8.3)

## 2010-10-28 LAB — CK TOTAL AND CKMB (NOT AT ARMC): CK, MB: 2.1 ng/mL (ref 0.3–4.0)

## 2010-10-28 LAB — TSH: TSH: 0.787 u[IU]/mL (ref 0.350–4.500)

## 2010-10-28 NOTE — H&P (Signed)
[name], [name] NO.:  [id number]  MEDICAL RECORD NO.:  [id number]  LOCATION:  [facility]                         FACILITY:  [facility]  PHYSICIAN:  Michiel Cowboy, MDDATE OF BIRTH:  [birth year]  DATE OF ADMISSION:  10/26/2010 DATE OF DISCHARGE:                             HISTORY & PHYSICAL   PRIMARY CARE PROVIDER:  [facility].  CHIEF COMPLAINT:  Altered mental status, shaking, and confused.  Mother is not sure if he has a syncopal episode now.  HISTORY:  The patient is as 50 year old gentleman with history of hepatitis C secondary to blood transfusion received in [place] after hip surgery.  The patient has end-stage liver disease, he was just evaluated of this and received large-volume thoracentesis in the beginning of July.  He also was diagnosed at that time with post- traumatic stress disorder and schizoaffective disorder.  His mother called an ambulance today because since last night, he has been altered, confused, talking out of his head, not eating and drinking properly.  She notes an episode where he was shaking all over though could not really say if it was seizure or not, seemed like he was kind of stiff.  She was unsure was he breathing or not.  He has been altered ever since.  She denied him having any fevers or any other complaints. No localized complains that she could tell.  The patient himself is fairly altered.  He does endorse abdominal pain, which per his family has been chronic, and otherwise review of systems is difficult to obtain secondary to language barrier as well as the patient's altered mental status.  There is no family at the bedside.  History partially obtain from the patient through an interpreter and partially from the family through an interpreter.  PAST MEDICAL HISTORY:  Significant for: 1. 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 2. Abdominal pain, which is felt to be chronic. 3. Chronic lower extremity  edema. 4. Schizoaffective disorder. 5. Post-traumatic stress disorder. 6. History of pancytopenia and hypothyroidism. 7. Remote history of hypertension, currently more hypertensive.  SOCIAL HISTORY:  The patient never drank.  He smokes.  He does not abuse drugs per family.  FAMILY HISTORY:  Noncontributory.  ALLERGIES:  He is allergic to PENICILLIN, IMIPRAMINE LAMOTRIGINE, and HEPARIN secondary to HIT antibiotics.  MEDICATIONS:  This is difficult to ascertain since the patient cannot state what medicines he is taking and his family is not sure. 1. The pharmacy felt that he probably is taking ranitidine 150 mg in     the morning. 2. Vitamin D2 400 units 2 tablets every morning. 3. Meclizine 25 mg every 8 hours as needed for dizziness. 4. Oxycodone 5 mg 4 times daily as needed.  He was also in the past discharged on: 1. Lasix. 2. Nicotine patch. 3. Nutritional supplement. 4. Spironolactone 100 mg daily 5. Divalproex extended release 500 mg 2 by mouth daily at bedtime. 6. Doxycycline 100 mg p.o. b.i.d. 7. Synthroid 12.5 mcg daily. 8. Nephro-Vite 1 tablet p.o. every morning. 9. Omeprazole 20 mg daily. 10.Risperdal 2 mg in the morning and 6 mg every evening 11.Trihexyphenidyl 5 mg 3 times daily.  I am not  sure she is taking     any of those medications and what amounts.  All this should be     confirmed in the morning per the pharmacist.  PHYSICAL EXAMINATION:  VITAL SIGNS:  Temperature 99.3, blood pressure 123/68, now down to 105/68, pulse 91, respirations 16, satting 97% on room air. GENERAL:  The patient appears to be slightly drowsy but in no acute distress.  When he does speak, per interpreter, it seems that he often loses train of thought and it is difficult to follow what he is saying, but it seems that his mental status may have improved slightly.  He is now able to tell me that he is at [facility], but he is not sure why he is here.  He still feels like it may be  because his mother was trying to call police. HEAD:  Appears to be nontraumatic.  Dry mucous membranes.  Decreased skin turgor. LUNGS:  Clear to auscultation bilaterally. HEART:  Regular rate and rhythm.  No murmurs appreciated. ABDOMEN:  Distended.  There is an area of warmth and redness on the anterior aspect of the abdomen of possible early cellulitis. LOWER EXTREMITIES:  Without clubbing, cyanosis, or edema. NEUROLOGICAL:  No asterixis present.  He is moving all 4 extremities, but not fully cooperative with exam.  LABORATORY DATA:  White blood cell count 6.5, hemoglobin 13.1.  Sodium 137, potassium 3.8, creatinine 1.96 which is up from the baseline of 1.69, total bili 1.5, albumin 3.0, ammonia level 36, alcohol level negative.  Acetaminophen and salicylate level unremarkable.  UA unremarkable.  CT scan of the head showing sinusitis in the left.  No further imaging was obtained.  No EKG on the chart either.  ASSESSMENT/PLAN:  This is a 50 year old gentleman with history of end- stage liver disease, altered, unclear etiology, question seizure versus syncope versus encephalopathy. 1. Question encephalopathy.  He does not show evidence of asterixis.     His ammonia level was normal.  This is not very consistent with     hepatic encephalopathy.  Nonetheless, we can get a trial with     lactulose to see if there is any improvement.  Other etiologies of     his encephalopathy could be infection.  At this point, his UA     appears to be normal.  He may have a little cellulitis in his     abdomen since he had been exposed to THE medical system.  We will     cover him for that with vanc.  Also, possibility of SVT could not     be ruled out, cover right now with Cipro.  He is PENICILLIN     allergic.  Other possibilities of seizure activity and being postictal.  This is not really confirmed.  CT scan of head was negative.  He may have been on valproic acid probably for mood control  before.  We will check for level of this.  We will also check EEG.  Consider further brain imaging. 1. Question syncope.  Again, the mother could not quite describe     whether he would loose consciousness or not because of a     significant verbal barrier.  We will cycle cardiac enzymes, check     carotid Dopplers.  He did have an echocardiogram in 2011 showing     normal EF.  We can attempt to repeat this.  At this point, the     etiology of his presentation is  not very clear.  It is also possible that his confusion is psychiatric in nature, possible psychosis.  He already has a diagnosis of schizoaffective disorder.  If all other medical causes have been ruled out, we will consider Psychiatric consult if he is still not improved. 1. Sinusitis.  This should be covered by antibiotics. 2. End-stage liver disease.  He does have significant ascites, but his     abdomen does not appear to be tense.  He may benefit from some     volume being taking off at some point, but I think at this point,     his overall volume status is more of volume down given slightly     increased creatinine and lack of peripheral edema.  We will     actually hold off on his Lasix for right now and give him gentle IV     fluids to see if that improves his mental status.3. History of chronic kidney disease.  See above. 4. Prophylaxis.  We will write for Protonix and SCDs.  The patient has     history of HIT, would avoid all heparin products.  I spent a total of over 70 minutes on this admission   Michiel Cowboy, MD     AVD/MEDQ  D:  10/27/2010  T:  10/27/2010  Job:  [id number]  cc:   [facility]  Electronically Signed by Therisa Doyne MD on 10/28/2010 12:11:14 AM

## 2010-10-29 LAB — CBC
HCT: 28.7 % — ABNORMAL LOW (ref 39.0–52.0)
Hemoglobin: 9.7 g/dL — ABNORMAL LOW (ref 13.0–17.0)
MCV: 92 fL (ref 78.0–100.0)
RBC: 3.12 MIL/uL — ABNORMAL LOW (ref 4.22–5.81)
WBC: 3.2 10*3/uL — ABNORMAL LOW (ref 4.0–10.5)

## 2010-10-29 LAB — COMPREHENSIVE METABOLIC PANEL
BUN: 18 mg/dL (ref 6–23)
CO2: 24 mEq/L (ref 19–32)
Chloride: 106 mEq/L (ref 96–112)
Creatinine, Ser: 1.6 mg/dL — ABNORMAL HIGH (ref 0.50–1.35)
GFR calc non Af Amer: 46 mL/min — ABNORMAL LOW (ref >60)
Glucose, Bld: 77 mg/dL (ref 70–99)
Total Bilirubin: 0.7 mg/dL (ref 0.3–1.2)

## 2010-10-29 NOTE — Procedures (Signed)
EEG NUMBER:  REFERRING PHYSICIAN:  Michiel Cowboy, MD  HISTORY:  A 50 year old male with syncope evaluated to rule out seizure.  MEDICATIONS:  Cipro, Pepcid, Chronulac, Synthroid, Ativan, NicoDerm CQ, Protonix, Aldactone, vancomycin.  CONDITION OF RECORDING:  This is a 16-channel EEG carried out patient in the awake, drowsy, and asleep states.  DESCRIPTION:  The waking background activity consists of a low-voltage symmetrical fairly well-organized 9 Hz alpha activity seen from the parieto-occipital and posterotemporal regions.  Low-voltage fast activity poorly organized was seen anteriorly at times, superimposed on more posterior rhythms.  A mixture of theta and alpha was seen from the central and temporal regions.  The patient drowses with slowing to irregular which is theta and beta activity.  The patient goes into a light sleep with symmetrical sleep spindles everted with sharp activity and irregular slow activity.  Hypoventilation and intermittent photic stimulation were not performed.  IMPRESSION:  This is a normal EEG.          ______________________________ Thana Farr, MD    ZO:[id number] D:  10/28/2010 18:36:33  T:  10/29/2010 01:51:31  Job #:  [id number]

## 2010-10-31 LAB — GLUCOSE, CAPILLARY: Glucose-Capillary: 96 mg/dL (ref 70–99)

## 2010-10-31 LAB — BASIC METABOLIC PANEL
Calcium: 9.7 mg/dL (ref 8.4–10.5)
Creatinine, Ser: 1.47 mg/dL — ABNORMAL HIGH (ref 0.50–1.35)
GFR calc non Af Amer: 51 mL/min — ABNORMAL LOW (ref >60)
Glucose, Bld: 107 mg/dL — ABNORMAL HIGH (ref 70–99)
Sodium: 138 mEq/L (ref 135–145)

## 2010-10-31 LAB — MAGNESIUM: Magnesium: 1.8 mg/dL (ref 1.5–2.5)

## 2010-10-31 LAB — AMMONIA: Ammonia: 33 umol/L (ref 11–60)

## 2010-11-01 ENCOUNTER — Inpatient Hospital Stay (HOSPITAL_COMMUNITY): Payer: Medicaid Other

## 2010-11-01 LAB — BASIC METABOLIC PANEL
Calcium: 9.6 mg/dL (ref 8.4–10.5)
Creatinine, Ser: 1.49 mg/dL — ABNORMAL HIGH (ref 0.50–1.35)
GFR calc non Af Amer: 50 mL/min — ABNORMAL LOW (ref >60)
Glucose, Bld: 92 mg/dL (ref 70–99)
Sodium: 140 mEq/L (ref 135–145)

## 2010-11-01 LAB — CBC
Hemoglobin: 10.6 g/dL — ABNORMAL LOW (ref 13.0–17.0)
MCH: 31.6 pg (ref 26.0–34.0)
MCHC: 34.8 g/dL (ref 30.0–36.0)
MCV: 91 fL (ref 78.0–100.0)

## 2010-11-02 ENCOUNTER — Other Ambulatory Visit (HOSPITAL_COMMUNITY): Payer: Medicaid Other

## 2010-11-02 ENCOUNTER — Inpatient Hospital Stay (HOSPITAL_COMMUNITY): Payer: Medicaid Other

## 2010-11-02 LAB — CULTURE, BLOOD (ROUTINE X 2)
Culture  Setup Time: 201207311029
Culture: NO GROWTH

## 2010-11-03 DIAGNOSIS — F259 Schizoaffective disorder, unspecified: Secondary | ICD-10-CM

## 2010-11-03 LAB — CULTURE, BLOOD (ROUTINE X 2)
Culture  Setup Time: 201208010106
Culture: NO GROWTH

## 2010-11-03 NOTE — Discharge Summary (Signed)
NAMEMACKLIN, Benjamin NO.:  Hull  MEDICAL RECORD NO.:  [id number]  LOCATION:  3029                         FACILITY:  [facility]  PHYSICIAN:  Benjamin Cove, MD     DATE OF BIRTH:  1960/12/31  DATE OF ADMISSION:  10/26/2010 DATE OF DISCHARGE:                        DISCHARGE SUMMARY - REFERRING   PRIMARY CARE PHYSICIAN:  [facility].  DISCHARGE DIAGNOSES: 1. New onset seizures. 2. End-stage liver disease. 3. End-stage liver disease/liver cirrhosis. 4. Ascites. 5. History of hepatitis C. 6. Schizoaffective disorder. 7. Post-traumatic stress disorder. 8. Hypothyroidism. 9. Mild pancytopenia suspect secondary to liver disease and     splenomegaly with splenic sequestration. 10.Encephalopathy which was postictal, resolved.  DISCHARGE MEDICATIONS:  Are as follows: 1. Ciprofloxacin 500 mg p.o. daily for SBP prophylaxis. 2. Lactulose 30 mL p.o. b.i.d. 3. Keppra 500 mg p.o. b.i.d. 4. Lasix 40 mg daily. 5. Aldactone 100 mg daily. 6. Abilify 10 mg daily. 7. Levothyroxine 25 mcg half tablet daily. 8. Meclizine 25 mg half tablet daily as needed. 9. Renal vitamin 1 tablet daily. 10.Omeprazole 1 tablet daily. 11.Oxycodone 5 mg p.o. q.i.d. p.r.n. 12.Vitamin D2 4000 units 2 tablets daily.  CONSULTANT: 1. Dr. Thad Ranger with Neurology for seizures. 2. Dr. Rogers Blocker with Psychiatry for med management of schizoaffective     disorder. 3. Dr. Mauro Kaufmann, Palliative Care Medicine.  DIAGNOSTIC INVESTIGATIONS:  Included a CT of the head, no acute intracranial abnormality, showed cerebral atrophy.  Chest x-ray, bibasilar opacities, differential include atelectasis or pneumonia. Repeat chest x-ray showed low lung volumes, improved aeration of the lung bases.  HOSPITAL COURSE:  [name] is a 50 year old [place] male with history of hep C and liver cirrhosis, now end-stage liver disease, presented to the hospital after a possible postictal event where he  was encephalopathic. 1. Encephalopathy, for this initially he was ruled out for hepatic     encephalopathy with normal ammonia level.  Did not have any     asterixis.  Also, did not have any significant electrolyte     abnormalities to explain his mental status, and subsequently, he     was felt to be postictal because there was a questionable history     of seizure disorder at home, but this was confirmed when the     patient had two more seizure episodes in the hospital, was seen by     Dr. Thad Ranger with Neurology in consultation and the patient was     started on Keppra and the patient has been seizure-free for over 48     hours.  MRI was recommended per Neuro.  At this point, MRI has been     attempted twice and the patient unable to tolerate this in radiology despite sedatives and at this point completely declines an MRI.Radiology      recommends consideration of conscious sedation.  Patient     adamantly declines at this point, hence, this could be deferred to     primary physician, consider doing as an outpatient if he is     agreeable to this.  Of note, he had a CT scan done in the hospital     on admission which  is unremarkable. 2. End-stage liver disease, hep C, ascites.  I increased his dose of     Lasix, Aldactone.  Also, he has been on Cipro for SBP prophylaxis.     He did have a paracentesis done last month and I did not repeat a     paracentesis at this point because he did not have any evidence of     increasing abdominal pain, distention or encephalopathy to suspect     SBP. 3. Schizoaffective disorder, for this, he was seen by Psychiatry.     Recently, his medications had been switched either by [facility] or     somebody apparently in his Pain Clinic and hence he was restarted     on Abilify in the hospital, has been relatively stable on this,     still has occasional emotional outbursts where he is crying and     declines medicines, but overall stable. 4. Deconditioning.   The patient is physically significantly     deconditioned.  He has been lying in bed at home as well as the     hospital for days and weeks on and he was seen by Physical Therapy     who recommended skilled nursing facility for physical     rehabilitation.  The patient and family have declined this and     hence will be discharged home with Home Care. He was also seen by Palliative Care in the hospital for prognostication, they felt that he would be hospice eligible, hence will be followed by Hospice at the time of discharge.  In terms of his liver disease and status of his cirrhosis with ascites, he had been referred to [facility] last month, at that time, not felt to be a candidate for transplant at the time, however, they are supposed to follow up with him.     Benjamin Cove, MD     PJ/MEDQ  D:  11/03/2010  T:  11/03/2010  Job:  [id number]  cc:   Dr. Merrily Pew  Electronically Signed by [name]  on 11/03/2010 05:43:30 PM

## 2010-11-05 NOTE — Consult Note (Signed)
  [name], ASHURST NO.:  [id number]  MEDICAL RECORD NO.:  [id number]  LOCATION:  3029                         FACILITY:  [facility]  PHYSICIAN:  Eulogio Ditch, MD DATE OF BIRTH:  [birth year]  DATE OF CONSULTATION:  10/28/2010 DATE OF DISCHARGE:                                CONSULTATION   REASON FOR CONSULTATION:  Med management/altered mental status.  HISTORY OF PRESENT ILLNESS:  A 50 year old male with a history of schizoaffective disorder and PTSD, who is well-known to me because of [facility] and in the ER.  The patient was admitted on the medical floor as he was confused, not eating or drinking properly.  The patient has number of medical issues. Currently, when I saw the patient, the patient is towards his baseline. He is not suicidal or homicidal, not hearing voices, he is not internally preoccupied, he is easily redirectable.  The patient was on Risperdal and Depakote in the past.  I have checked his medical record and it was written that he is on Abilify, but he is not started on any medications currently.  The patient has been stable for long time on Depakote and Risperdal.  I do not see any reason of changing the medication to Abilify.  I told the clinical social worker to call the mother to get more information on this.  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  ALLERGIES:  The patient is allergic to PENICILLIN, IMIPENEM, LAMOTRIGINE and HEPARIN secondary to HIT ANTIBIOTICS.  MENTAL STATUS EXAM:  The patient is currently calm, cooperative, was able to recognize me.  Not suicidal or homicidal, not internally preoccupied, not delusional, alert, awake, oriented x3.  The patient was able to tell me that why he is in the hospital, that he was confused at home.  DIAGNOSES:  AXIS I:   Schizoaffective disorder, post-traumatic stress disorder. AXIS II:  Deferred. AXIS III:  See medical notes. AXIS IV:  Chronic medical issues. AXIS V:  40-50.  RECOMMENDATIONS: 1. As discussed in HPI.  I told the clinical social worker to check     with the mother why his medications were changed from Depakote and     Risperdal to Abilify. 2. Once I get the collateral information, I will follow up on this     patient for his med management.     Eulogio Ditch, MD     SA/MEDQ  D:  10/28/2010  T:  10/28/2010  Job:  [id number]  Electronically Signed by Eulogio Ditch  on 11/05/2010 08:55:42 AM

## 2010-11-07 ENCOUNTER — Emergency Department (HOSPITAL_COMMUNITY)
Admission: EM | Admit: 2010-11-07 | Discharge: 2010-11-13 | Disposition: A | Discharge status: Home / Self Care | Payer: Medicaid Other | Attending: Emergency Medicine | Admitting: Emergency Medicine

## 2010-11-07 DIAGNOSIS — K7460 Unspecified cirrhosis of liver: Secondary | ICD-10-CM | POA: Insufficient documentation

## 2010-11-07 DIAGNOSIS — F411 Generalized anxiety disorder: Secondary | ICD-10-CM | POA: Insufficient documentation

## 2010-11-07 DIAGNOSIS — F29 Unspecified psychosis not due to a substance or known physiological condition: Secondary | ICD-10-CM | POA: Insufficient documentation

## 2010-11-07 DIAGNOSIS — I10 Essential (primary) hypertension: Secondary | ICD-10-CM | POA: Insufficient documentation

## 2010-11-07 DIAGNOSIS — Z79899 Other long term (current) drug therapy: Secondary | ICD-10-CM | POA: Insufficient documentation

## 2010-11-07 DIAGNOSIS — IMO0002 Reserved for concepts with insufficient information to code with codable children: Secondary | ICD-10-CM | POA: Insufficient documentation

## 2010-11-07 DIAGNOSIS — Z8619 Personal history of other infectious and parasitic diseases: Secondary | ICD-10-CM | POA: Insufficient documentation

## 2010-11-07 DIAGNOSIS — E039 Hypothyroidism, unspecified: Secondary | ICD-10-CM | POA: Insufficient documentation

## 2010-11-07 LAB — RAPID URINE DRUG SCREEN, HOSP PERFORMED
Barbiturates: NOT DETECTED
Benzodiazepines: NOT DETECTED
Cocaine: NOT DETECTED
Opiates: NOT DETECTED
Tetrahydrocannabinol: NOT DETECTED

## 2010-11-07 LAB — DIFFERENTIAL
Basophils Absolute: 0 10*3/uL (ref 0.0–0.1)
Basophils Relative: 1 % (ref 0–1)
Eosinophils Absolute: 0.1 10*3/uL (ref 0.0–0.7)
Eosinophils Relative: 2 % (ref 0–5)
Monocytes Absolute: 0.3 10*3/uL (ref 0.1–1.0)
Monocytes Relative: 6 % (ref 3–12)
Neutro Abs: 2.9 10*3/uL (ref 1.7–7.7)

## 2010-11-07 LAB — COMPREHENSIVE METABOLIC PANEL
ALT: 12 U/L (ref 0–53)
Albumin: 2.8 g/dL — ABNORMAL LOW (ref 3.5–5.2)
Alkaline Phosphatase: 68 U/L (ref 39–117)
Calcium: 9.8 mg/dL (ref 8.4–10.5)
GFR calc Af Amer: >60 mL/min (ref >60)
Potassium: 3.5 mEq/L (ref 3.5–5.1)
Sodium: 138 mEq/L (ref 135–145)
Total Protein: 6.2 g/dL (ref 6.0–8.3)

## 2010-11-07 LAB — CBC
Hemoglobin: 11.7 g/dL — ABNORMAL LOW (ref 13.0–17.0)
MCH: 32.1 pg (ref 26.0–34.0)
MCHC: 34.7 g/dL (ref 30.0–36.0)
Platelets: 167 10*3/uL (ref 150–400)
RDW: 16.1 % — ABNORMAL HIGH (ref 11.5–15.5)

## 2010-11-09 NOTE — Consult Note (Signed)
NAMEKWAN, SHELLHAMMER NO.:  [id number]  MEDICAL RECORD NO.:  [id number]  LOCATION:  3029                         FACILITY:  [facility]  PHYSICIAN:  Thana Farr, MD    DATE OF BIRTH:  01-08-1961  DATE OF CONSULTATION:  10/28/2010 DATE OF DISCHARGE:                                CONSULTATION   REASON FOR CONSULTATION:  Questionable seizure versus encephalopathy.  HISTORY OF PRESENT ILLNESS:  This is a 50 year old gentleman with past medical history of end-stage liver disease, hep C, chronic abdominal pain, chronic lower extremity edema, schizoaffective disorder, post- traumatic stress disorder, pancytopenia and hypothyroidism.  The patient was recently seen in the hospital on October 04, 2010, for massive ascites. The patient at that time had a paracentesis performed.  The patient was discharged home at that time on Depakote 500 mg 2 tablets by mouth daily at bedtime.  Per hospital note, the patient was sent home to his mother who noted on October 26, 2010, that his mental status became altered and he became confused and he was talking out of his head.  He had also been noted not to be eating or drinking as he usually does.  There is note that there was one episode in which he was shaking all over and questioning of stiffening up but she was not sure if this was seizure activity.  Unfortunately, the patient is the only person in the room at this time and he does not recall this.  While hospitalized, the patient also had a second episode of 30 seconds of stiffening and questioning of seizure activity.  For that reason, Neurology was consulted to see the patient and possibly recommend antiepileptics.  PAST MEDICAL HISTORY:  Significant for: 1. End-stage liver disease. 2. Abdominal pain. 3. Chronic lower extremity edema. 4. Schizoaffective disorder. 5. Post-traumatic stress disorder. 6. Pancytopenia. 7. Remote history of hypertension.  MEDICATIONS:  At the present  time, the patient is on Cipro, Pepcid, lactulose, Synthroid, Ativan, Protonix, spironolactone, Zofran, and oxycodone.  ALLERGIES:  PENICILLIN, IMIPRAMINE, LAMICTAL, HEPARIN which caused HIT syndrome.  SOCIAL HISTORY:  The patient does smoke, he does not drink or do illicit drugs.  Lives with his mother.  REVIEW OF SYSTEMS:  Negative with the exception above.  PHYSICAL EXAMINATION:  VITAL SIGNS:  His blood pressure is 113/75, pulse 84, respiration 18, temperature 98.7. GENERAL:  The patient is alert, he is oriented x3, can tell me that he is at [facility] that the date is October 28, 2010.  He follows two-step commands.  At times, he refuses to take part in the physical exam. NEURO:  Pupils are equal, round and reactive to light and accommodating conjugate.  Extraocular movements are intact.  There is no ptosis visualized.  Face symmetrical.  Tongue is midline.  Uvula is midline. No dysarthria.  No aphasia.  No slurred speech.  No facial droop. Coordination:  Finger-to-nose was smooth, heel-to-shin was smooth but limited.  Gait was not tested.  The patient was moving all extremities 5/5.  He showed no asterixis, no clonus, no abnormal muscle movements. Deep tendon reflexes were 1+ throughout.  Downgoing toes bilaterally. The patient's sensation  was full to pinprick, light touch and vibration throughout. PULMONARY:  Clear to auscultation. CARDIOVASCULAR:  S1-S2 is audible.  LABORATORY DATA:  The patient's labs showed CK 41, CK-MB of 1.9, Depakote level of less than 10.  TSH is 0.787.  PT of 17.9, and INR of 1.45.  Sodium 137, potassium 3.9, chloride 105, CO2 21, glucose of 86, BUN 16, creatinine 1.57, magnesium of 1.7, phosphorus of 2.9.  Head CT shows cerebral atrophy without acute intracranial abnormality.  EEG pending.  ASSESSMENT:  This 50 year old male with post-traumatic stress disorder, chronic schizoaffective disorder, hepatitis C, end-stage liver  disease presenting with initial altered mental status and question of seizure. The patient has been on Depakote 500 mg 2 tablets at bedtime for psychiatric issues in the past.  The patient has no history of seizure disorder.  At this point, the patient is alert and oriented x3, shows no seizure activity.  RECOMMENDATIONS: 1. Obtain EEG. 2. If the patient seizes, refrain from liver metabolized      antiepileptic drugs, such as Dilantin and Depakote.  Would lend more toward Keppra .  We will continue to follow the patient while     he is here in the hospital.     Felicie Morn, PA-C   ______________________________ Thana Farr, MD    DS/MEDQ  D:  10/28/2010  T:  10/29/2010  Job:  [id number]  Electronically Signed by Felicie Morn PA-C on 11/06/2010 09:21:53 AM Electronically Signed by Thana Farr MD on 11/09/2010 12:49:25 AM

## 2010-11-10 NOTE — Progress Notes (Signed)
[name], [name] NO.:  [id number]  MEDICAL RECORD NO.:  [id number]  LOCATION:  5008                         FACILITY:  [facility]  PHYSICIAN:  Altha Harm, MDDATE OF BIRTH:  July 23, 1960                                PROGRESS NOTE   DIAGNOSES AT THE TIME OF DICTATION: 1. Cirrhosis. 2. Hepatitis C. 3. Massive ascites secondary to __________ from cirrhosis and     hepatitis. 4. Portal hypertension. 5. Hypothyroidism. 6. Schizoaffective disorder. 7. Hypertension. 8. Chronic lower extremity edema. 9. Post-traumatic stress disorder. 10.Diabetes insipidus. 11.Acute on chronic renal failure. 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 14.Vertigo.  MEDICATIONS: To be dictated at the time of discharge.  CONSULTANTS: 1. [facility]. 2. Interventional Radiology. 3. Psychiatry, Dr. Rogers Blocker.  PROCEDURE: Large volume paracentesis x2.  DIAGNOSTIC STUDIES: 1. Two-view chest x-ray which shows interval development of     consolidation or atelectasis in the left lung base with shallow     inspiration. 2. Abdominal ultrasound which shows normal size kidneys with increased     parenchymal echogenicity consistent with medical renal disease.  No     evidence of hydronephrosis. Impression: A.  Hepatic cirrhosis with a large amount of ascites. B.  Moderate splenomegaly consistent with portal venous hypertension. C.  No evidence of gallstones or biliary dilatation. 1. Bilateral venous Dopplers of the lower extremities which showed no     evidence of DVT or superficial thrombus involving the right or left     lower extremities.  There is no evidence of Baker's cyst on the     right or the left.  No change since studies of January 30, 2010,     2010/07/26, July 06, 2010, and June 23, 2010.  PRIMARY CARE PHYSICIAN: The patient is seen at [facility].  CODE STATUS: Full  code.  ALLERGIES: 1. PENICILLIN. 2. IMIPRAMINE. 3. LAMICTAL. 4. HEPARIN.  PHYSICAL EXAMINATION: GENERAL:  Today, the patient is in no acute distress.  He appears chronically ill but does not appear to be in any acute decompensation clinically in him. VITAL SIGNS:  His temperature was 98.6, heart rate 87, blood pressure 104/54, respiratory rate 18, O2 sats are 99% on room air. HEENT:  The patient is normocephalic, atraumatic.  Pupils are equally round and reactive to light and accommodation.  Extraocular movements are intact.  Oropharynx is moist.  There is no exudate, erythema, or lesions noted. NECK:  His trachea is midline.  There are no masses, no thyromegaly, no JVD.  No carotid bruit. RESPIRATORY:  The patient has a normal respiratory effort.  He has equal excursion bilaterally, cannot appreciate any wheezing, rhonchi, or rales which is an improvement over my examination 48 hours ago. ABDOMEN:  Actually markedly distended with massive ascites.  He has got no tenderness and I cannot appreciate any masses or hepatosplenomegaly. CARDIOVASCULAR:  He has got a normal S1 and  S2.  I cannot appreciate any murmurs, rubs, or gallops.  His PMI is nondisplaced.  There are no heaves or thrills on palpation. LYMPH NODE SURVEY:  He has got no cervical, axillary, or inguinal lymphadenopathy noted. NEUROLOGICAL:  The  patient has no focal neurological deficits.  He does have generalized weakness.  The strength in his bilateral upper extremities are 3+ to 4-/5, in bilateral lower extremities 3-3+/5.  His DTRs are 2+ bilaterally in the upper and lower extremities. MUSCULOSKELETAL:  He has got no warmth, swelling, or erythema around the joints.  The patient has chronic lower extremity edema which by his admission is unchanged from how it normally is and by Doppler evaluation findings from a venous Doppler perspective seems to be unchanged from the 4 Dopplers that were done earlier this  year. PSYCHIATRIC:  The patient has a normal affect.  He seems to be well- compensated.  He is alert and oriented x3.  He has good remote and recent recall.  The patient has good cognition, however, I am not sure about the patient's insight into his limitations based on his clinical condition.  ASSESSMENT/PLAN: 1. Cirrhosis, hepatitis C leading to portal hypertension and massive     ascites.  The patient prior to this hospitalization had not been     under the care of Gastroenterology on an ongoing basis.  He had     been on diuretic prior to coming to the hospital.  However, he had     not been on any disease directed care.  Paracentesis on this     admission revealed findings consistent with portal hypertension.     At this time, I have discussed with Gastroenterology the     possibility of a transjugular intrahepatic portosystemic shunt     procedure on this gentleman as despite paracentesis x2, the patient     continues to reaccumulate ascitic fluid.  Dr. Dulce Sellar is in     consultation and is reviewing to decide whether or not this patient     is a candidate for any further intervention.  With diuresis, the     patient appears to be developing further renal insufficiency,     although the patient is at his baseline creatinine of 1.5 at this     time.  Presently, the patient is on 50 mg of Aldactone and 40 mg of     Lasix and I am reluctant to increase his diuretics due to the renal     insufficiency. 2. Chronic lower extremity edema.  This patient has chronic lower     extremity edema and keeps getting Doppler evaluation for this.  I     would be very hesitant to institute any further Doppler examination     of his lower extremities unless the patient presents with a change     in his pain, shortness of breath, or any other clinical signs that     could be construed as an acute deep venous thrombosis.  The patient     is pretty reliable on describing his lower extremity edema and  I     would elicit information from this patient as to whether or not he     has had a change in his lower extremity swelling as a trigger for     performing any further diagnostic studies for deep venous     thrombosis. 3. Hypertension.  The patient is mildly hypertensive, however, with     the fluid loss he is now relatively hypotensive. 4. Vertigo.  The patient has vertigo which he has stated prevents him     from being able to stand.  The patient has been started on     meclizine, however, it has  been difficult to assess whether or not     it has been effective as the patient is very reluctant to get out     of bed. 5. Schizoaffective disorder.  The patient is known to have     schizoaffective disorder and is on Depo, risperidone, Artane in the     outpatient setting.  We do not have Depo and risperidone on     formulary and the patient has been placed on a regimen of Risperdal     2 mg p.o. in the a.m. and 6 mg p.o. at bedtime.  At the time of     discharge, however, I would revert the patient back to his Depo     form of risperidone and make sure that the patient has a followup     appointment with his Outpatient Mental Health to obtain that. 6. 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  The patient does have heparin antibodies, thus is unable     to receive anticoagulation for deep venous thrombosis prophylaxis     and thus he has received SCDs. 7. Diabetes insipidus.  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  We have had long     conversation with this patient about the fact that he needs to     limit his fluids.  He states that he is willing to try.  Otherwise,     the patient has been stable in terms of his discharge.  All     recommendations from PT and Occupational Therapy of which I am in     agreement leads to that the  patient should be in skilled facility     for further rehab.  The patient, however, is alert and oriented x3.     He does have capacity to make decisions for his outpatient     disposition and he wants to go home with Home Health and states     under no circumstances will he go to an inpatient rehab.  This brings [place] up-to-date on the patient.     Altha Harm, MD     MAM/MEDQ  D:  09/29/2010  T:  09/29/2010  Job:  [id number]  Electronically Signed by Marthann Schiller MD on 11/10/2010 08:45:50 PM

## 2010-11-11 NOTE — Consult Note (Signed)
NAMEJOSHUAN, [name] NO.:  [id number]  MEDICAL RECORD NO.:  [id number]  LOCATION:  5008                         FACILITY:  [facility]  PHYSICIAN:  Zetta Bills, MD          DATE OF BIRTH:  July 08, 1960  DATE OF CONSULTATION:  09/29/2010 DATE OF DISCHARGE:                                CONSULTATION   PRIMARY CARE PROVIDER:  [facility].  NEPHROLOGY CONSULTED BY:  Dr. Marthann Schiller of the [facility] for the evaluation and management of [name] elevated creatinine.  HISTORY OF PRESENT ILLNESS:  [name] is a 50 year old Caucasian man of [place] origin with known history of cirrhosis of the liver secondary to underlying hepatitis C and prior history of alcohol use.  He also suffers from schizoaffective disorder and post-traumatic stress disorder with evaluation and admissions to behavioral health for helping control psychotic episodes.  Upon evaluation of records available from e-chart, it is noted that back in 2002, he had a creatinine ranging from 1.0 all the way up to 1.3 and he suffered acute renal insufficiency in October 2011 with creatinine rose to a range of 1.6-1.8.  He was then admitted to the hospital in February and March of this year, and at that time, creatinine was noted to range from 1.2-1.4, reflecting a GFR spanning from 55-60 mL per minute.  Later on in April 2012, he was readmitted to the hospital for issue surrounding his edema and ascites and at that time creatinine seems to have now settled at 1.6 (GFR 48 mL per minute).  Most recently, he was admitted in late June for worsening pedal edema and ascites and now his creatinine seems to be ranging a little higher between 1.6 all the way to 1.8.  His corresponding urine analysis has been unyielding for any proteinuria or hematuria and sediment was not done due to the negative dipstick markers.  [name] denies any prior history of kidney stones, denies any  regular NSAID use, denies any hematuria and explicitly denies any proteinuria. He states that a few years back, when discovered to have hepatitis C, he was started on Pegasus therapy, however, after 7 months of therapy, this treatment was stopped for some reason or the other.  Unclear as to whether he had treatment failure at that time.  Workup so far has been yielding for portal hypertension and his ascites seems to be attributed to this portal hypertension effect that stems from his hepatic cirrhosis.  He continues to maintain a good urine output while on furosemide and spironolactone therapy.  PAST MEDICAL HISTORY: 1. Portal hypertension with cirrhosis and underlying hepatitis C.     Prior history of alcohol use, currently apparently abstinent. 2. Chronic kidney disease, stage III. 3. Schizoaffective disorder with post-traumatic stress disorder. 4. Hypothyroidism. 5. Heparin-induced thrombocytopenia. 6. History of vertigo. 7. Hypertension. 8. History of gout. 9. History of lithium use in the past for schizoaffective disorder.     Apparently therapy was stopped about 3-4 years ago. 10.Chronic lower extremity edema worse over the last few months.  MEDICATIONS:  Currently in the hospital: 1. Depakote 1000 mg at bedtime. 2. Pepcid 20  mg daily. 3. Furosemide 40 mg daily. 4. Aldactone 50 mg p.o. daily. 5. Risperdal 2 mg daily with 6 mg at bedtime. 6. Nephro-Vite at bedtime. 7. Potassium chloride 40 mg daily p.r.n. 8. Protonix 40 mg daily. 9. Nicotine patch. 10.Synthroid 12.5 mcg daily. 11.Artane 5 mg p.o. t.i.d. 12.Morphine sulfate p.r.n.  ALLERGIES: 1. PENICILLIN causes a rash. 2. IMIPRAMINE caused altered mental status. 3. LAMICTAL unknown reaction. 4. HEPARIN-induced thrombocytopenia.  SOCIAL HISTORY:  He is single and resides at home with his parents. Denies any tobacco, alcohol, or illicit drug abuse.  FAMILY HISTORY:  Denies any significant history of kidney  disease in the family.  REVIEW OF SYSTEMS:  Negative other than the HPI above.  PHYSICAL EXAM:  VITAL SIGNS:  Blood pressure 104/54, respiratory rate of 18, pulse of 87, temperature of 98.6 degrees Fahrenheit. GENERAL:  Large built Caucasian man, resting comfortably in bed, obviously protuberant belly. HEAD, NECK, and ENT SYSTEM:  Head is normocephalic, atraumatic.  Pupils are equal and reactive to light.  He is edentulous. NECK:  Supple without any obvious goiter or lymphadenopathy.  JVD elevated at about 5 cm. CARDIOVASCULAR:  Pulses are regular in rate and rhythm.  Heart sounds S1 and S2 are normal without any obvious murmurs, rubs, or gallops. RESPIRATORY:  Diminished breath sounds bibasally.  No rales, retractions or rhonchi. ABDOMEN:  Soft, protuberant with palpable spleen tip.  Hepatic margin is not palpable.  Mild tenderness over the lower quadrant. EXTREMITIES:  3+ pitting edema bilaterally. SKIN:  Petechial rashes noted over both feet.  RADIOLOGIC EVALUATION:  Ultrasound of the abdomen was done shows right kidney measuring 11.3 cm without hydronephrosis, however, with increased parenchymal echogenicity.  Left kidney measures 11.4 cm with increasedechogenicity.  No hydronephrosis.  Hepatic cirrhosis was also noted in the abdominal ultrasound with large amount of ascites.  LABS:  Sodium 137, potassium 3.3, bicarbonate 33, BUN 25, creatinine 1.6, glucose 78, calcium 9.1, albumin 2.2, AST 16, ALT 6, INR 1.37. Prothrombin time 17.1 seconds.  Hemoglobin done September 25, 2010 is 11.2, platelet count 93,000, white cell count 2800.  ASSESSMENT AND PLAN: 1. Elevated creatinine.  Based on the constellation of findings and     the review of the data, this is likely to be progressive chronic     kidney disease.  Currently, chronic kidney disease stage III.  This     is further tested by his renal ultrasound that shows features     consistent with chronic kidney disease.  His bland  urinary sediment     weighs against the possibility of a nephrotic syndrome, however,     given his concomitant cirrhosis, he is at an increased risk for     hepatorenal syndrome type 2 (progressive renal decline).  His prior     lithium exposure also put him at a risk for chronic interstitial     nephritis and this therapy apparently has been stopped a few years     back.  We will reanalyze his urinary sediment, quantify a spot     urine protein to creatinine ratio, and screen for plasma cell     dyscrasias with an SPEP and serum free light chain.  At this time,     we will also screen for hepatitis C associated renal disease with a     C3, C4, and rheumatoid factor.  The probability of cryoglobulinemic     vasculitis exists given the presence of the petechiae seen on his  lower extremity exam.  With his current volume status, would up     titrate Aldactone to 100 mg daily and increase furosemide 40 mg     b.i.d.  Plan is noted by Gastroenterology for therapeutic     paracentesis in 2-3 days with probable intravenous albumin     repletion if volume tapped is greater than 4 liters. 2. Ascites, portal hypertension, and cirrhosis likely associated with     hepatitis C and prior alcohol use.  Ongoing therapeutic     paracentesis and attempted diuresis.  Discussions initiated by     Gastroenterology for probable future evaluation at [facility] for consideration for orthotopic liver transplant. 3. Anemia/pancytopenia.  It is likely associated with cirrhosis and     associated sequelae.  Continue to monitor this closely. 4. Hypokalemia due to ongoing diuretic use.  Agree with potassium     repletion and prompt rechecking.     Zetta Bills, MD     JP/MEDQ  D:  09/29/2010  T:  09/30/2010  Job:  [id number]  cc:   Altha Harm, MD  Electronically Signed by Zetta Bills MD on 11/11/2010 04:15:36 PM

## 2010-11-17 ENCOUNTER — Inpatient Hospital Stay (HOSPITAL_COMMUNITY)
Admission: EM | Admit: 2010-11-17 | Discharge: 2010-12-11 | DRG: 885 | Disposition: A | Discharge status: Other Acute Inpatient Hospital | Payer: Medicaid Other | Attending: Internal Medicine | Admitting: Internal Medicine

## 2010-11-17 DIAGNOSIS — M109 Gout, unspecified: Secondary | ICD-10-CM | POA: Diagnosis present

## 2010-11-17 DIAGNOSIS — D696 Thrombocytopenia, unspecified: Secondary | ICD-10-CM | POA: Diagnosis present

## 2010-11-17 DIAGNOSIS — K7460 Unspecified cirrhosis of liver: Secondary | ICD-10-CM | POA: Diagnosis present

## 2010-11-17 DIAGNOSIS — E876 Hypokalemia: Secondary | ICD-10-CM | POA: Diagnosis present

## 2010-11-17 DIAGNOSIS — F259 Schizoaffective disorder, unspecified: Principal | ICD-10-CM | POA: Diagnosis present

## 2010-11-17 DIAGNOSIS — N183 Chronic kidney disease, stage 3 unspecified: Secondary | ICD-10-CM | POA: Diagnosis present

## 2010-11-17 DIAGNOSIS — B1920 Unspecified viral hepatitis C without hepatic coma: Secondary | ICD-10-CM | POA: Diagnosis present

## 2010-11-17 DIAGNOSIS — IMO0002 Reserved for concepts with insufficient information to code with codable children: Secondary | ICD-10-CM | POA: Diagnosis not present

## 2010-11-17 DIAGNOSIS — D649 Anemia, unspecified: Secondary | ICD-10-CM | POA: Diagnosis present

## 2010-11-17 DIAGNOSIS — E039 Hypothyroidism, unspecified: Secondary | ICD-10-CM | POA: Diagnosis present

## 2010-11-17 DIAGNOSIS — G40909 Epilepsy, unspecified, not intractable, without status epilepticus: Secondary | ICD-10-CM | POA: Diagnosis present

## 2010-11-17 DIAGNOSIS — F4310 Post-traumatic stress disorder, unspecified: Secondary | ICD-10-CM | POA: Diagnosis present

## 2010-11-17 LAB — CBC
HCT: 37.8 % — ABNORMAL LOW (ref 39.0–52.0)
Hemoglobin: 12.8 g/dL — ABNORMAL LOW (ref 13.0–17.0)
MCHC: 33.9 g/dL (ref 30.0–36.0)
RBC: 4.06 MIL/uL — ABNORMAL LOW (ref 4.22–5.81)
WBC: 9.2 10*3/uL (ref 4.0–10.5)

## 2010-11-17 LAB — HEPATIC FUNCTION PANEL
ALT: 15 U/L (ref 0–53)
AST: 29 U/L (ref 0–37)
Albumin: 3.3 g/dL — ABNORMAL LOW (ref 3.5–5.2)
Total Protein: 6.8 g/dL (ref 6.0–8.3)

## 2010-11-17 LAB — URINALYSIS, ROUTINE W REFLEX MICROSCOPIC
Bilirubin Urine: NEGATIVE
Glucose, UA: NEGATIVE mg/dL
Hgb urine dipstick: NEGATIVE
Specific Gravity, Urine: 1.01 (ref 1.005–1.030)
pH: 6 (ref 5.0–8.0)

## 2010-11-17 LAB — DIFFERENTIAL
Basophils Absolute: 0.1 10*3/uL (ref 0.0–0.1)
Lymphocytes Relative: 30 % (ref 12–46)
Monocytes Absolute: 0.8 10*3/uL (ref 0.1–1.0)
Neutro Abs: 5.4 10*3/uL (ref 1.7–7.7)

## 2010-11-17 LAB — POCT I-STAT, CHEM 8
Creatinine, Ser: 2.2 mg/dL — ABNORMAL HIGH (ref 0.50–1.35)
Hemoglobin: 14.3 g/dL (ref 13.0–17.0)
Potassium: 3.6 mEq/L (ref 3.5–5.1)
Sodium: 138 mEq/L (ref 135–145)

## 2010-11-17 LAB — RAPID URINE DRUG SCREEN, HOSP PERFORMED
Barbiturates: NOT DETECTED
Benzodiazepines: NOT DETECTED
Cocaine: NOT DETECTED
Opiates: NOT DETECTED
Tetrahydrocannabinol: NOT DETECTED

## 2010-11-17 LAB — AMMONIA: Ammonia: 29 umol/L (ref 11–60)

## 2010-11-18 LAB — URINE CULTURE
Colony Count: NO GROWTH
Culture  Setup Time: 201208220342
Culture: NO GROWTH

## 2010-11-19 LAB — BASIC METABOLIC PANEL
BUN: 16 mg/dL (ref 6–23)
Calcium: 9.2 mg/dL (ref 8.4–10.5)
Creatinine, Ser: 1.64 mg/dL — ABNORMAL HIGH (ref 0.50–1.35)
GFR calc Af Amer: 54 mL/min — ABNORMAL LOW (ref >60)

## 2010-11-23 LAB — BASIC METABOLIC PANEL
Calcium: 10.3 mg/dL (ref 8.4–10.5)
Creatinine, Ser: 1.77 mg/dL — ABNORMAL HIGH (ref 0.50–1.35)
GFR calc non Af Amer: 41 mL/min — ABNORMAL LOW (ref >60)
Glucose, Bld: 80 mg/dL (ref 70–99)
Sodium: 139 mEq/L (ref 135–145)

## 2010-11-25 NOTE — Consult Note (Signed)
[name], MADARIAGA NO.:  [id number]  MEDICAL RECORD NO.:  [id number]  LOCATION:  3029                         FACILITY:  [facility]  PHYSICIAN:  Mauro Kaufmann, MD         DATE OF BIRTH:  12-Oct-1960  DATE OF CONSULTATION: DATE OF DISCHARGE:                                CONSULTATION   REQUESTING PHYSICIAN:  Dr. Jomarie Longs.  REASON FOR CONSULTATION:  Goals of care.  HISTORY OF PRESENT ILLNESS:  This is a 50 year old [place] male who has a history of hepatitis C, end-stage liver disease, post-traumatic stress disorder, schizoaffective disorder, recurrent seizures, who was brought to the hospital on October 27, 2010, after the patient had altered mental status with shaking with possible seizures.  The patient has been seen by Neurology in the hospital and has been started on Keppra 500 b.i.d. after the patient had another seizure last night.  The patient's EEG was done which was negative.  The patient was discharged from the hospital on July 8th, at that time, he was supposed to go to [facility] and meet a hepatologist.  The patient's mother tells me that they met the hepatologist on July 17th and at that time she was told that the patient is currently not a candidate for liver transplant, but the hematologist will call the patient when if needed.  At this time, the patient does have a diagnosis of end-stage liver disease with cirrhosis and also has complications of liver cirrhosis including the ascites and encephalopathy.  In addition to that, the patient also has a PTSD and schizoaffective disorder which is managed by Psych.  Currently, the patient resides at home with mother and most of times he is confined to the bed.  The patient has been receiving some nursing care at home with some physical therapy.  The main concern for the mother at this time is confusion at home which she sometimes have difficulty managing at home.  PAST MEDICAL HISTORY:  Significant for 1. History  of ascites. 2. End-stage liver disease. 3. Chronic lower extremity edema. 4. Schizoaffective disorder. 5. Post-traumatic stress disorder. 6. Pancytopenia. 7. Hypothyroidism.  Discussed in detail with the patient's mother via the [place] interpreter and we discussed about the patient's illness, the natural trajectory of the illness, the patient's poor prognosis, code status including CPR intubation and mechanical ventilation, feeding tube and benefit by home hospice.  Following are the recommendations. 1. The patient is full code. 2. Continue the current management. 3. The patient will benefit from home hospice.  The patient will     benefit from continued psych evaluation both     inpatient as well as outpatient because of his underlying PTSD as     well as schizoaffective disorder.  Total time spent in this     consultation is 90 minutes.  Time in is 9:50 a.m. Time out is 11:20     a.m..  More than 50% of time was spent in counseling coordination     of care.     Mauro Kaufmann, MD     GL/MEDQ  D:  10/31/2010  T:  10/31/2010  Job:  [id number]  Electronically  Signed by Mauro Kaufmann  on 11/25/2010 07:39:27 AM

## 2010-12-02 LAB — DIFFERENTIAL
Basophils Absolute: 0 10*3/uL (ref 0.0–0.1)
Basophils Relative: 1 % (ref 0–1)
Eosinophils Absolute: 0.2 10*3/uL (ref 0.0–0.7)
Monocytes Absolute: 0.3 10*3/uL (ref 0.1–1.0)
Monocytes Relative: 7 % (ref 3–12)
Neutro Abs: 2.2 10*3/uL (ref 1.7–7.7)
Neutrophils Relative %: 46 % (ref 43–77)

## 2010-12-02 LAB — CBC
Hemoglobin: 11.2 g/dL — ABNORMAL LOW (ref 13.0–17.0)
MCH: 31.4 pg (ref 26.0–34.0)
MCHC: 33.8 g/dL (ref 30.0–36.0)
Platelets: 135 10*3/uL — ABNORMAL LOW (ref 150–400)

## 2010-12-02 LAB — COMPREHENSIVE METABOLIC PANEL
Albumin: 3.4 g/dL — ABNORMAL LOW (ref 3.5–5.2)
Alkaline Phosphatase: 71 U/L (ref 39–117)
BUN: 16 mg/dL (ref 6–23)
CO2: 29 mEq/L (ref 19–32)
Chloride: 98 mEq/L (ref 96–112)
Creatinine, Ser: 1.83 mg/dL — ABNORMAL HIGH (ref 0.50–1.35)
GFR calc Af Amer: 48 mL/min — ABNORMAL LOW (ref >60)
GFR calc non Af Amer: 39 mL/min — ABNORMAL LOW (ref >60)
Glucose, Bld: 103 mg/dL — ABNORMAL HIGH (ref 70–99)
Potassium: 3 mEq/L — ABNORMAL LOW (ref 3.5–5.1)
Total Bilirubin: 1.7 mg/dL — ABNORMAL HIGH (ref 0.3–1.2)

## 2010-12-02 LAB — URINALYSIS, ROUTINE W REFLEX MICROSCOPIC
Glucose, UA: NEGATIVE mg/dL
Ketones, ur: NEGATIVE mg/dL
Nitrite: NEGATIVE
Protein, ur: NEGATIVE mg/dL
Urobilinogen, UA: 4 mg/dL — ABNORMAL HIGH (ref 0.0–1.0)

## 2010-12-02 LAB — PROTIME-INR: Prothrombin Time: 16.9 seconds — ABNORMAL HIGH (ref 11.6–15.2)

## 2010-12-02 LAB — URINE MICROSCOPIC-ADD ON
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
## 2010-12-04 LAB — BASIC METABOLIC PANEL
Calcium: 11.4 mg/dL — ABNORMAL HIGH (ref 8.4–10.5)
GFR calc Af Amer: 44 mL/min — ABNORMAL LOW (ref >60)
GFR calc non Af Amer: 37 mL/min — ABNORMAL LOW (ref >60)
Glucose, Bld: 90 mg/dL (ref 70–99)
Potassium: 4.4 mEq/L (ref 3.5–5.1)
Sodium: 137 mEq/L (ref 135–145)

## 2010-12-06 DIAGNOSIS — F259 Schizoaffective disorder, unspecified: Secondary | ICD-10-CM

## 2010-12-08 NOTE — Progress Notes (Signed)
  NAMEDRAY, DENTE NO.:  [id number]  MEDICAL RECORD NO.:  [id number]  LOCATION:  1507                         FACILITY:  [facility]  PHYSICIAN:  Andreas Blower, MD       DATE OF BIRTH:  03-31-1960                                PROGRESS NOTE   PRIMARY CARE PHYSICIAN: [facility].  BRIEF ADMITTING HISTORY AND PHYSICAL: [name] is a 50 year old gentleman with complicated medical history, who had initially presented to [facility] ER on November 17, 2010, awaiting psychiatric bed placement.  Currently, the patient is on a waiting list for [facility].  The ACT team is attempting to obtain guardianship and skilled rehab is being eventually considered for the patient.  The patient is medically stable.  However, the hospitalist service was asked to admit the patient until disposition could be arranged.  LABORATORY DATA: Most recent labs on December 04, 2010, show electrolytes normal with a BUN of 16 and creatinine 1.95.  PROBLEM LIST: 1. Psychosis with schizoaffective disorder. 2. Chronic kidney disease, stage 3. 3. Normocytic anemia and thrombocytopenia likely due to end-stage     liver disease. 4. History of posttraumatic stress disorder. 5. End-stage liver disease, on chronic ciprofloxacin for SBP     prophylaxis. 6. History of hepatitis C with cirrhosis. 7. History of gout. 8. History of seizure disorder. 9. Hypokalemia, resolved with replacement. 10.History of lithium-induced diabetes insipidus. 11.Hypothyroidism.  HOSPITAL COURSE: The patient is medically stable for discharge.  However, the patient is pending admission to [facility].  The patient during the course of hospital stay, demonstrated multiple episodes of being aggressive and combative.  Psychiatry, Dr. Rogers Blocker has been evaluating the patient and has been adjusting his medications.  DISPOSITION: Pending bed placement to [facility].     Andreas Blower,  MD     SR/MEDQ  D:  12/08/2010  T:  12/08/2010  Job:  [id number]  Electronically Signed by Wardell Heath Kiyonna Tortorelli  on 12/08/2010 08:40:50 PM

## 2010-12-24 ENCOUNTER — Ambulatory Visit: Payer: Medicaid Other | Admitting: Gastroenterology

## 2010-12-24 LAB — CBC
MCHC: 34.2
MCV: 97.1
RBC: 4.06 — ABNORMAL LOW
WBC: 8.8

## 2010-12-24 LAB — POCT I-STAT, CHEM 8
Chloride: 111
HCT: 42
Hemoglobin: 14.3
Potassium: 5.2 — ABNORMAL HIGH
Sodium: 143

## 2010-12-24 LAB — DIFFERENTIAL
Basophils Relative: 0
Eosinophils Absolute: 0.1
Lymphs Abs: 1.8
Neutro Abs: 6.1
Neutrophils Relative %: 70

## 2010-12-24 LAB — POCT CARDIAC MARKERS
CKMB, poc: <1 — ABNORMAL LOW
Myoglobin, poc: 301
Troponin i, poc: <0.05

## 2011-01-12 NOTE — Discharge Summary (Signed)
NAMELOWEN, BARRINGER NO.:  [id number]  MEDICAL RECORD NO.:  [id number]  LOCATION:                                 FACILITY:  PHYSICIAN:  Catherene Kaleta I Samnang Shugars, MD      DATE OF BIRTH:  16-May-1960  DATE OF ADMISSION:  11/08/2010 DATE OF DISCHARGE:  12/10/2010                              DISCHARGE SUMMARY     DISCHARGE DIAGNOSES: 1. Psychosis with schizoaffective disorder. 2. Chronic kidney disease, type 2. 3. History of end-stage liver disease, on chronic ciprofloxacin for     SBP prophylaxis. 4. History of hepatitis C. 5. History of gout. 6. History of seizure. 7. History of Nexium-induced diabetes insipidus. 8. History of hypothyroidism. 9. History of posttraumatic stress disorder.  DISCHARGE MEDICATIONS: 1. Keppra 500 mg p.o. b.i.d. 2. Lactulose 30 mL p.o. b.i.d. 3. Furosemide 40 mg p.o. daily. 4. Cipro 500 mg p.o. daily indefinitely. 5. Vitamin D 800 units p.o. daily. 6. Synthroid 12.5 mg p.o. daily. 7. Protonix 40 mg p.o. daily. 8. Nephro-Vite one tablet daily. 9. Risperdal 2 mg p.o. q.a.m. and at bedtime. 10.Meclizine 25 mg daily as needed for dizziness. 11.Omeprazole 20 mg p.o. daily. 12.Oxycodone 5 mg immediate release one tablet q.4 times daily as     needed. 13.Aldactone 100 mg daily. 14.Vitamin D2 two tablets every morning. 15.Depakote 250 mg p.o. q.a.m. and at bedtime.  HISTORY OF PRESENT ILLNESS:  This is a 50 year old male with extreme past medical history.  He has been to emergency room at [facility] since August 21 awaiting psychiatric bed placement.  He is currently on waiting list at [facility].  Attempted to obtain guardianship and a skilled rehab is being evaluated to consider at this point.  He needs to be made aware of the stay before placement to skilled facility.  He remained on maintenance medications.  He has been eating poorly and profound weight loss.  1. Schizoaffective disorder and bipolar disorder.   The patient was     seen by Psychiatry, adjustment of medication made, Depakote 250 mg     p.o. q.a.m. and at bedtime added.  Also, Dr. Rogers Blocker discontinued     Abilify and is started on Risperdal 2 mg p.o. q.a.m. and at     bedtime.  The patient remained on Geodon 10 mg IV twice daily     p.r.n. for severe agitation.  The patient is sometimes agitated and     receives Haldol p.r.n.  The patient continued with aggressiveness     and combativeness.  Diagnosis made was psychosis with     schizoaffective disorder.  The patient will be transferred to [facility] for further behavioral stabilization.  1. History of liver cirrhosis and end-stage liver disease.  His     ammonia level remained within normal of 29.  He has chronic kidney     failure, which is stable.  On the date of discharge, the patient is     speaking garbage.  His vital signs are stable.  We felt the patient     is medically stable to be transferred.     Abia Monaco I  Eda Paschal, MD     HIE/MEDQ  D:  12/10/2010  T:  12/10/2010  Job:  [id number]  Electronically Signed by Ebony Cargo MD on 01/12/2011 11:49:18 AM

## 2011-01-12 NOTE — Discharge Summary (Signed)
  NAMEJOURNEE, Hull NO.:  [id number]  MEDICAL RECORD NO.:  [id number]  LOCATION:  1507                         FACILITY:  [facility]  PHYSICIAN:  Sajjad Honea I Jamaiya Tunnell, MD      DATE OF BIRTH:  1960-11-30  DATE OF ADMISSION:  11/17/2010 DATE OF DISCHARGE:  12/12/2010                              DISCHARGE SUMMARY   DISCHARGE DIAGNOSES: 1. Psychosis with schizoaffective disorder. 2. History of seizure. 3. History of liver disease, end-stage with liver cirrhosis. 4. History of hepatitis C. 5. Posttraumatic stress disorder. 6. History of hypothyroidism. 7. Chronic kidney disease, stage 3.  Dictation ended at this point.     Adiana Smelcer Bosie Helper, MD     HIE/MEDQ  D:  12/10/2010  T:  12/10/2010  Job:  [id number]  Electronically Signed by Ebony Cargo MD on 01/12/2011 11:49:14 AM

## 2011-11-07 ENCOUNTER — Emergency Department (HOSPITAL_COMMUNITY): Payer: Medicaid Other

## 2011-11-07 ENCOUNTER — Emergency Department (HOSPITAL_COMMUNITY)
Admission: EM | Admit: 2011-11-07 | Discharge: 2011-11-08 | Disposition: A | Discharge status: Home / Self Care | Payer: Medicaid Other | Attending: Emergency Medicine | Admitting: Emergency Medicine

## 2011-11-07 ENCOUNTER — Encounter (HOSPITAL_COMMUNITY): Payer: Self-pay | Admitting: *Deleted

## 2011-11-07 DIAGNOSIS — F17200 Nicotine dependence, unspecified, uncomplicated: Secondary | ICD-10-CM | POA: Insufficient documentation

## 2011-11-07 DIAGNOSIS — K769 Liver disease, unspecified: Secondary | ICD-10-CM

## 2011-11-07 DIAGNOSIS — R45850 Homicidal ideations: Secondary | ICD-10-CM

## 2011-11-07 DIAGNOSIS — F259 Schizoaffective disorder, unspecified: Secondary | ICD-10-CM

## 2011-11-07 DIAGNOSIS — F29 Unspecified psychosis not due to a substance or known physiological condition: Secondary | ICD-10-CM

## 2011-11-07 DIAGNOSIS — R188 Other ascites: Secondary | ICD-10-CM

## 2011-11-07 DIAGNOSIS — K219 Gastro-esophageal reflux disease without esophagitis: Secondary | ICD-10-CM | POA: Diagnosis present

## 2011-11-07 DIAGNOSIS — N289 Disorder of kidney and ureter, unspecified: Secondary | ICD-10-CM | POA: Diagnosis present

## 2011-11-07 DIAGNOSIS — D649 Anemia, unspecified: Secondary | ICD-10-CM | POA: Diagnosis present

## 2011-11-07 DIAGNOSIS — Z79899 Other long term (current) drug therapy: Secondary | ICD-10-CM | POA: Insufficient documentation

## 2011-11-07 DIAGNOSIS — F609 Personality disorder, unspecified: Secondary | ICD-10-CM | POA: Diagnosis present

## 2011-11-07 HISTORY — DX: Personality disorder, unspecified: F60.9

## 2011-11-07 HISTORY — DX: Gastro-esophageal reflux disease without esophagitis: K21.9

## 2011-11-07 HISTORY — DX: Personal history of other endocrine, nutritional and metabolic disease: Z86.39

## 2011-11-07 HISTORY — DX: Personal history of other medical treatment: Z92.89

## 2011-11-07 HISTORY — DX: Schizoaffective disorder, unspecified: F25.9

## 2011-11-07 HISTORY — DX: Hypothyroidism, unspecified: E03.9

## 2011-11-07 HISTORY — DX: Unspecified viral hepatitis C without hepatic coma: B19.20

## 2011-11-07 HISTORY — DX: Anemia, unspecified: D64.9

## 2011-11-07 HISTORY — DX: Polyneuropathy, unspecified: G62.9

## 2011-11-07 HISTORY — DX: Disorder of kidney and ureter, unspecified: N28.9

## 2011-11-07 HISTORY — DX: Post-traumatic stress disorder, unspecified: F43.10

## 2011-11-07 HISTORY — DX: Personal history of other diseases of the respiratory system: Z87.09

## 2011-11-07 HISTORY — DX: Other ascites: R18.8

## 2011-11-07 HISTORY — DX: Poisoning by unspecified drugs, medicaments and biological substances, accidental (unintentional), initial encounter: T50.901A

## 2011-11-07 LAB — PROTIME-INR
INR: 1.26 (ref 0.00–1.49)
Prothrombin Time: 16.1 seconds — ABNORMAL HIGH (ref 11.6–15.2)

## 2011-11-07 LAB — COMPREHENSIVE METABOLIC PANEL
Alkaline Phosphatase: 71 U/L (ref 39–117)
BUN: 21 mg/dL (ref 6–23)
Creatinine, Ser: 2.27 mg/dL — ABNORMAL HIGH (ref 0.50–1.35)
GFR calc Af Amer: 37 mL/min — ABNORMAL LOW (ref >90)
Glucose, Bld: 99 mg/dL (ref 70–99)
Potassium: 3.9 mEq/L (ref 3.5–5.1)
Total Bilirubin: 0.4 mg/dL (ref 0.3–1.2)
Total Protein: 6.4 g/dL (ref 6.0–8.3)

## 2011-11-07 LAB — CBC WITH DIFFERENTIAL/PLATELET
Eosinophils Absolute: 0.1 10*3/uL (ref 0.0–0.7)
Eosinophils Relative: 2 % (ref 0–5)
HCT: 30.6 % — ABNORMAL LOW (ref 39.0–52.0)
Hemoglobin: 10.2 g/dL — ABNORMAL LOW (ref 13.0–17.0)
Lymphs Abs: 1.2 10*3/uL (ref 0.7–4.0)
MCH: 29.3 pg (ref 26.0–34.0)
MCHC: 33.3 g/dL (ref 30.0–36.0)
MCV: 87.9 fL (ref 78.0–100.0)
Monocytes Absolute: 0.5 10*3/uL (ref 0.1–1.0)
Monocytes Relative: 12 % (ref 3–12)
Neutrophils Relative %: 55 % (ref 43–77)
RBC: 3.48 MIL/uL — ABNORMAL LOW (ref 4.22–5.81)

## 2011-11-07 LAB — AMMONIA: Ammonia: 43 umol/L (ref 11–60)

## 2011-11-07 LAB — ETHANOL: Alcohol, Ethyl (B): <11 mg/dL (ref 0–11)

## 2011-11-07 MED ORDER — ZIPRASIDONE MESYLATE 20 MG IM SOLR
10.0000 mg | Freq: Once | INTRAMUSCULAR | Status: AC
  Administered 2011-11-07: 10 mg via INTRAMUSCULAR
  Filled 2011-11-07: qty 20

## 2011-11-07 NOTE — ED Notes (Signed)
Report received from previous RN at pts door. Pt agitated and yelling and refusing even entry into his room. Security called. Pt refused care from me saying "You are a pork eating woman and I will not take anything from you." Pts mother waiting in hallway and very anxious to leave.
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
## 2011-11-07 NOTE — ED Notes (Addendum)
Pt refuses to ambulate and per his mother pt has not been ambulatory for the last 2 years.
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
## 2011-11-07 NOTE — ED Notes (Signed)
Patient's mother states that he has not ambulated in almost 2 years. Uses wheelchair at home to get around, although bedroom is upstairs mother states he climbs up the stairs to get to bedroom
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
## 2011-11-07 NOTE — ED Notes (Signed)
Patient brought in to ED by EMS per mothers request because she claimed his abdominal ascites his getting worse. Patient denies that he has any problem states his abdomen is fine denies pain. Abdomen is extremely distended. Patient will not allow me to assess his abdomen because I am "not a man".  Patient refusing to put on grown

## 2011-11-08 ENCOUNTER — Encounter (HOSPITAL_COMMUNITY): Payer: Self-pay | Admitting: *Deleted

## 2011-11-08 DIAGNOSIS — K769 Liver disease, unspecified: Secondary | ICD-10-CM | POA: Diagnosis present

## 2011-11-08 DIAGNOSIS — F4310 Post-traumatic stress disorder, unspecified: Secondary | ICD-10-CM

## 2011-11-08 DIAGNOSIS — K219 Gastro-esophageal reflux disease without esophagitis: Secondary | ICD-10-CM | POA: Diagnosis present

## 2011-11-08 DIAGNOSIS — F259 Schizoaffective disorder, unspecified: Secondary | ICD-10-CM | POA: Diagnosis present

## 2011-11-08 DIAGNOSIS — D649 Anemia, unspecified: Secondary | ICD-10-CM | POA: Diagnosis present

## 2011-11-08 DIAGNOSIS — K7290 Hepatic failure, unspecified without coma: Secondary | ICD-10-CM

## 2011-11-08 DIAGNOSIS — F609 Personality disorder, unspecified: Secondary | ICD-10-CM | POA: Diagnosis present

## 2011-11-08 DIAGNOSIS — N289 Disorder of kidney and ureter, unspecified: Secondary | ICD-10-CM | POA: Diagnosis present

## 2011-11-08 DIAGNOSIS — B1920 Unspecified viral hepatitis C without hepatic coma: Secondary | ICD-10-CM | POA: Insufficient documentation

## 2011-11-08 DIAGNOSIS — F29 Unspecified psychosis not due to a substance or known physiological condition: Secondary | ICD-10-CM

## 2011-11-08 DIAGNOSIS — R188 Other ascites: Secondary | ICD-10-CM

## 2011-11-08 DIAGNOSIS — R45850 Homicidal ideations: Secondary | ICD-10-CM

## 2011-11-08 LAB — BASIC METABOLIC PANEL
BUN: 21 mg/dL (ref 6–23)
Chloride: 102 mEq/L (ref 96–112)
GFR calc Af Amer: 38 mL/min — ABNORMAL LOW (ref >90)
GFR calc non Af Amer: 33 mL/min — ABNORMAL LOW (ref >90)
Glucose, Bld: 105 mg/dL — ABNORMAL HIGH (ref 70–99)
Potassium: 3.9 mEq/L (ref 3.5–5.1)
Sodium: 135 mEq/L (ref 135–145)

## 2011-11-08 LAB — URINALYSIS, ROUTINE W REFLEX MICROSCOPIC
Bilirubin Urine: NEGATIVE
Ketones, ur: NEGATIVE mg/dL
Nitrite: NEGATIVE
Specific Gravity, Urine: 1.014 (ref 1.005–1.030)
Urobilinogen, UA: 1 mg/dL (ref 0.0–1.0)

## 2011-11-08 LAB — RAPID URINE DRUG SCREEN, HOSP PERFORMED: Barbiturates: NOT DETECTED

## 2011-11-08 MED ORDER — LORAZEPAM 1 MG PO TABS: 1.0000 mg | ORAL_TABLET | Freq: Three times a day (TID) | ORAL | Status: DC | PRN

## 2011-11-08 MED ORDER — FUROSEMIDE 20 MG PO TABS
20.0000 mg | ORAL_TABLET | Freq: Two times a day (BID) | ORAL | Status: DC
  Filled 2011-11-08 (×5): qty 1

## 2011-11-08 MED ORDER — RIFAXIMIN 550 MG PO TABS
550.0000 mg | ORAL_TABLET | Freq: Two times a day (BID) | ORAL | Status: DC
  Administered 2011-11-08: 550 mg via ORAL
  Filled 2011-11-08 (×5): qty 1

## 2011-11-08 MED ORDER — QUETIAPINE FUMARATE 300 MG PO TABS
600.0000 mg | ORAL_TABLET | Freq: Every day | ORAL | Status: DC
  Administered 2011-11-08: 600 mg via ORAL
  Filled 2011-11-08: qty 2

## 2011-11-08 MED ORDER — IBUPROFEN 200 MG PO TABS: 600.0000 mg | ORAL_TABLET | Freq: Three times a day (TID) | ORAL | Status: DC | PRN

## 2011-11-08 MED ORDER — DIVALPROEX SODIUM 250 MG PO DR TAB
250.0000 mg | DELAYED_RELEASE_TABLET | Freq: Two times a day (BID) | ORAL | Status: DC
  Administered 2011-11-08: 250 mg via ORAL
  Filled 2011-11-08 (×3): qty 1

## 2011-11-08 MED ORDER — ALUM & MAG HYDROXIDE-SIMETH 200-200-20 MG/5ML PO SUSP: 30.0000 mL | ORAL | Status: DC | PRN

## 2011-11-08 MED ORDER — FA-PYRIDOXINE-CYANOCOBALAMIN 2.5-25-2 MG PO TABS
1.0000 | ORAL_TABLET | Freq: Every day | ORAL | Status: DC
  Filled 2011-11-08 (×2): qty 1

## 2011-11-08 MED ORDER — ONDANSETRON HCL 4 MG PO TABS: 4.0000 mg | ORAL_TABLET | Freq: Three times a day (TID) | ORAL | Status: DC | PRN

## 2011-11-08 MED ORDER — FERROUS SULFATE 325 (65 FE) MG PO TABS
325.0000 mg | ORAL_TABLET | Freq: Every day | ORAL | Status: DC
  Administered 2011-11-08: 325 mg via ORAL
  Filled 2011-11-08 (×3): qty 1

## 2011-11-08 MED ORDER — FA-PYRIDOXINE-CYANOCOBALAMIN 2.5-25-2 MG PO TABS
1.0000 | ORAL_TABLET | Freq: Every day | ORAL | Status: DC
  Administered 2011-11-08: 1 via ORAL
  Filled 2011-11-08: qty 1

## 2011-11-08 MED ORDER — CIPROFLOXACIN HCL 500 MG PO TABS: 500.0000 mg | ORAL_TABLET | ORAL | Status: DC

## 2011-11-08 MED ORDER — SPIRONOLACTONE 100 MG PO TABS
100.0000 mg | ORAL_TABLET | Freq: Every day | ORAL | Status: DC
  Filled 2011-11-08 (×2): qty 1

## 2011-11-08 MED ORDER — ZIPRASIDONE MESYLATE 20 MG IM SOLR
10.0000 mg | Freq: Once | INTRAMUSCULAR | Status: AC | PRN
  Administered 2011-11-08: 10 mg via INTRAMUSCULAR
  Filled 2011-11-08: qty 20

## 2011-11-08 MED ORDER — CIPROFLOXACIN HCL 500 MG PO TABS: 250.0000 mg | ORAL_TABLET | ORAL | Status: DC

## 2011-11-08 MED ORDER — ZOLPIDEM TARTRATE 5 MG PO TABS: 10.0000 mg | ORAL_TABLET | Freq: Every evening | ORAL | Status: DC | PRN

## 2011-11-08 MED ORDER — ACETAMINOPHEN 325 MG PO TABS: 650.0000 mg | ORAL_TABLET | ORAL | Status: DC | PRN

## 2011-11-08 MED ORDER — FAMOTIDINE 20 MG PO TABS
20.0000 mg | ORAL_TABLET | Freq: Every day | ORAL | Status: DC
  Administered 2011-11-08: 20 mg via ORAL

## 2011-11-08 MED ORDER — ZIPRASIDONE HCL 20 MG PO CAPS: 20.0000 mg | ORAL_CAPSULE | Freq: Every day | ORAL | Status: DC

## 2011-11-08 MED ORDER — RIFAXIMIN 550 MG PO TABS
550.0000 mg | ORAL_TABLET | Freq: Two times a day (BID) | ORAL | Status: DC
  Administered 2011-11-08: 550 mg via ORAL
  Filled 2011-11-08 (×2): qty 1

## 2011-11-08 MED ORDER — NICOTINE 21 MG/24HR TD PT24: 21.0000 mg | MEDICATED_PATCH | Freq: Every day | TRANSDERMAL | Status: DC

## 2011-11-08 MED ORDER — LACTULOSE 10 GM/15ML PO SOLN
20.0000 g | Freq: Two times a day (BID) | ORAL | Status: DC
  Filled 2011-11-08 (×4): qty 30

## 2011-11-08 MED ORDER — LACTULOSE 10 GM/15ML PO SOLN
20.0000 g | Freq: Two times a day (BID) | ORAL | Status: DC
  Filled 2011-11-08: qty 30

## 2011-11-08 MED ORDER — PANTOPRAZOLE SODIUM 40 MG PO TBEC
40.0000 mg | DELAYED_RELEASE_TABLET | Freq: Every day | ORAL | Status: DC
  Administered 2011-11-08: 40 mg via ORAL
  Filled 2011-11-08 (×2): qty 1

## 2011-11-08 MED ORDER — FAMOTIDINE 20 MG PO TABS
20.0000 mg | ORAL_TABLET | Freq: Every day | ORAL | Status: DC
  Filled 2011-11-08 (×2): qty 1

## 2011-11-08 MED ORDER — SPIRONOLACTONE 100 MG PO TABS
100.0000 mg | ORAL_TABLET | Freq: Every day | ORAL | Status: DC
  Administered 2011-11-08: 100 mg via ORAL
  Filled 2011-11-08: qty 1

## 2011-11-08 NOTE — BHH Counselor (Signed)
Pt too drowsy to be assessed. Writer will attempt to assess later this am.

## 2011-11-08 NOTE — ED Notes (Addendum)
Took patient a [place] sandwich

## 2011-11-08 NOTE — ED Notes (Signed)
Went in patient room to wash patient with Kenita nt. Patient told Kenita nt to go in corner and turn around so I could help wash his feet. Patient help to take off pants and jacket but refused to take off t-shirt and personal depend. Patient wanted to hang on to hospital diaper but would not put it on. Patient asked if personal clothes would be washed and what time. Patient told "i dont know". Patient told me to put clothes in the corner. Patient hands are soiled. Patient refused to allow staff to wash hands. Patient stated "I will wash my hands before I eat with paper towel". Patient has on paper scrubs top over t-shirt and pants. Paper shirt is to small for patient dissented abdomen but patient refused bigger shirt.

## 2011-11-08 NOTE — ED Notes (Signed)
Report give to Dr. Jacky Kindle with Telepsych

## 2011-11-08 NOTE — ED Notes (Signed)
Pt unable to void at this time. 

## 2011-11-08 NOTE — ED Notes (Signed)
Pt provided with remote, pt demanding to turn TV on channel 31, informed pt he has control of remote, pt continues demanding to turn TV on channel 31; TV turned on & placed on channel 31.
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
## 2011-11-08 NOTE — ED Notes (Signed)
Patient used the phone. 

## 2011-11-08 NOTE — Progress Notes (Addendum)
Pt has been seen by the unit psychiatrist and is cleared for discharge home. Pt's IVC paper work was reversed by the unit psychiatrist. EDP notified and is in agreement with disposition. CSW contacted pt's mother/legal guardian, Kire Ferg, via hospital interpreter. Mother is willing to accept pt back home and verbalized understanding that the pt does not require medical or psychiatric hospitalization. Mother states that the pt receives ACT services through [facility] through [facility]. Mother requested assistance with PT services in the home for which CSW consulted with ED CM. No further needs identified at this time. Pt will be transported via [facility]AR back to his residence.   CSW signing off at this time.

## 2011-11-08 NOTE — Progress Notes (Signed)
[facility] ED Cm consulted by ED SW to assist with home health PT/OT services per pt's mother request for PT/OT Mother reports pt has not had PT/OT since d/c  [place] was ordered [facility] and PT/OT through [facility] CM attempted to reached [facility] [phone] but unable to reach any staff after hours CM spoke Dr Lars Mage, EDP about orders needed.  EDP referred to pcp CM requested orders to assist pt with d/c from [facility] ED.

## 2011-11-08 NOTE — ED Notes (Signed)
Attempted to complete telepsych but pt became combative refusing to answer any of the questions and demanding that the monitor be turned off. Will attempt to repeat in the morning when pt is more alert.

## 2011-11-08 NOTE — ED Provider Notes (Signed)
Assuming care of Mr. Carreon this morning. Mr. Stepter has hx of psychiatric conditions, Hep C. Pt comes in with AMS. Internal medicine consulted, and their recommendation was to gt psychiatry on board and await their recs. 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  Reviewing the chart - patient's vitals are WNL mostly. Ammonia is in the 40s. This am, patient continues with AMS. He has on going psychoses, and he thinks everyone around him is communist. He is refusing some of his medications, and per nurse also made threatening remarks to them. Patient has been involuntarily committed.  Plan: - Continue with the scheduled meds. - PRN meds for agitation - and we might have to administer them IM. - We will give patient every opportunity to co-operate  -but if he is threatening and if our employees or his safety is at stake - he will be physically restrained. - Additionally, i will speak with Internal medicine again - and if they feel he needs to get lactulose, we will have to give him that. If patient doesn't agree with po meds, might have to physically restrain him and administer meds through the NG.    Derwood Kaplan, MD 11/08/11 1003

## 2011-11-08 NOTE — ED Notes (Signed)
Provided telepsych results to Dr. Fonnie Jarvis. Recommendation was for medical follow-up r/t endstage liver and hepatic encephalopathy.

## 2011-11-08 NOTE — ED Notes (Signed)
Continues threatening staff

## 2011-11-08 NOTE — BHH Counselor (Signed)
Writer and RN [name] discussed pt with [name]. Hospitalist recommends pt be admitted psychiatrically. Telepsych Dr. Jacky Kindle recommends pt be admitted medically b/c thinks he has Stage 3 hepatic encephalopathy. [name] indicates he will follow up.
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
## 2011-11-08 NOTE — ED Notes (Signed)
Pt demanding lunch, informed pt multiple times lunch will be served @ 1300, pt continues yelling, sitter remains @ BS.

## 2011-11-08 NOTE — ED Notes (Signed)
ZOX:[id number]<AV> Expected date:<BR> Expected time:<BR> Means of arrival:<BR> Comments:<BR> CLOSED

## 2011-11-08 NOTE — Consult Note (Signed)
See progress note.
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
## 2011-11-08 NOTE — ED Notes (Signed)
Took patient a drink

## 2011-11-08 NOTE — ED Notes (Signed)
Pharmacy notified of need for scheduled meds.

## 2011-11-08 NOTE — ED Notes (Signed)
Pt became combative during bedside reporting. He was throwing objects and cussing at staff. Pt wants to use the phone to call his mother and was told it is too early. That he can call her at 0900.

## 2011-11-08 NOTE — Progress Notes (Signed)
Orders obtained for PT/OT ED SW updated Services to be resumed per home health agency

## 2011-11-08 NOTE — Progress Notes (Signed)
[facility] CM ED consulted by [facility] hospitalist, [name] ED CM reviewed EPIC notes, labs, imaging from 11/07/11 & 11/08/11. This pt was evaluated by telepsych and then [facility] hospitalist on 11/07/11 evening who dx psychosis.  Labs wnl.  Pt last hospitalization was at [facility] 11/17/10 to 12/11/10 [facility] Pt told Dr [name] "no" when went to re evaluate pt.  CM contacted EDP to request Consults for SW & ACT.
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
## 2011-11-08 NOTE — ED Provider Notes (Signed)
Pt has been evaluated by Internal Medicine twice who felt he did not need medical admission and also evaluated by Dr Gerre Scull, psychiatry who feels patient does not meet criteria for admission. Pt signed no harm contract and has agreed to take geodon which has worked well in the ED to control his behavior.   Ward Givens, MD 11/08/11 1919

## 2011-11-09 NOTE — Progress Notes (Signed)
[facility] ED CM spoke with Joyce Gross at [facility] [phone]. Pt did receive [facility] Pt d/c from [facility] on 08/27/11 and will be unable to receive any further therapy related to Medicaid regulation that pt is only allowed 3 days per year of therapy and he has already per [place] received them.  CM spoke with pt at [phone] who states that he is already aware that he can no longer receive further therapy Pt able to inform CM that he received therapy in May 2013. Pt updated CM that he was awaiting a return call from he psychiatrist or his RN for his next f/u appointment Reports he has receive his medicine and taking tylenol as needed Informed Cm his pcp is Dr Clinton Sawyer Pt informed Cm his mother did not speak english, he would translate to her the information about his therapy, thanked CM for calling and said "good bye" prior to hanging up

## 2011-12-23 ENCOUNTER — Emergency Department (HOSPITAL_COMMUNITY)
Admission: EM | Admit: 2011-12-23 | Discharge: 2011-12-24 | Disposition: A | Discharge status: Home / Self Care | Payer: Medicaid Other | Attending: Emergency Medicine | Admitting: Emergency Medicine

## 2011-12-23 ENCOUNTER — Encounter (HOSPITAL_COMMUNITY): Payer: Self-pay

## 2011-12-23 DIAGNOSIS — Z8659 Personal history of other mental and behavioral disorders: Secondary | ICD-10-CM | POA: Insufficient documentation

## 2011-12-23 DIAGNOSIS — Z046 Encounter for general psychiatric examination, requested by authority: Secondary | ICD-10-CM | POA: Insufficient documentation

## 2011-12-23 DIAGNOSIS — E232 Diabetes insipidus: Secondary | ICD-10-CM | POA: Insufficient documentation

## 2011-12-23 DIAGNOSIS — F609 Personality disorder, unspecified: Secondary | ICD-10-CM | POA: Insufficient documentation

## 2011-12-23 DIAGNOSIS — F4320 Adjustment disorder, unspecified: Secondary | ICD-10-CM | POA: Insufficient documentation

## 2011-12-23 DIAGNOSIS — Z79899 Other long term (current) drug therapy: Secondary | ICD-10-CM | POA: Insufficient documentation

## 2011-12-23 LAB — CBC
HCT: 31.4 % — ABNORMAL LOW (ref 39.0–52.0)
Hemoglobin: 10.1 g/dL — ABNORMAL LOW (ref 13.0–17.0)
MCH: 28.7 pg (ref 26.0–34.0)
MCHC: 32.2 g/dL (ref 30.0–36.0)
MCV: 89.2 fL (ref 78.0–100.0)
Platelets: 137 10*3/uL — ABNORMAL LOW (ref 150–400)
RBC: 3.52 MIL/uL — ABNORMAL LOW (ref 4.22–5.81)
RDW: 17.2 % — ABNORMAL HIGH (ref 11.5–15.5)
WBC: 3.7 10*3/uL — ABNORMAL LOW (ref 4.0–10.5)

## 2011-12-23 LAB — RAPID URINE DRUG SCREEN, HOSP PERFORMED
Amphetamines: NOT DETECTED
Barbiturates: NOT DETECTED
Benzodiazepines: NOT DETECTED
Cocaine: NOT DETECTED
Opiates: NOT DETECTED
Tetrahydrocannabinol: NOT DETECTED

## 2011-12-23 LAB — COMPREHENSIVE METABOLIC PANEL
ALT: 8 U/L (ref 0–53)
AST: 20 U/L (ref 0–37)
Albumin: 2.5 g/dL — ABNORMAL LOW (ref 3.5–5.2)
Alkaline Phosphatase: 75 U/L (ref 39–117)
BUN: 25 mg/dL — ABNORMAL HIGH (ref 6–23)
CO2: 24 mEq/L (ref 19–32)
Calcium: 9.4 mg/dL (ref 8.4–10.5)
Chloride: 98 mEq/L (ref 96–112)
Creatinine, Ser: 2.2 mg/dL — ABNORMAL HIGH (ref 0.50–1.35)
GFR calc Af Amer: 38 mL/min — ABNORMAL LOW (ref >90)
GFR calc non Af Amer: 33 mL/min — ABNORMAL LOW (ref >90)
Glucose, Bld: 93 mg/dL (ref 70–99)
Potassium: 4.8 mEq/L (ref 3.5–5.1)
Sodium: 132 mEq/L — ABNORMAL LOW (ref 135–145)
Total Bilirubin: 0.3 mg/dL (ref 0.3–1.2)
Total Protein: 6.4 g/dL (ref 6.0–8.3)

## 2011-12-23 LAB — ETHANOL: Alcohol, Ethyl (B): <11 mg/dL (ref 0–11)

## 2011-12-23 LAB — LIPASE, BLOOD: Lipase: 43 U/L (ref 11–59)

## 2011-12-23 LAB — URINALYSIS, ROUTINE W REFLEX MICROSCOPIC
Bilirubin Urine: NEGATIVE
Glucose, UA: NEGATIVE mg/dL
Hgb urine dipstick: NEGATIVE
Ketones, ur: NEGATIVE mg/dL
Leukocytes, UA: NEGATIVE
Nitrite: NEGATIVE
Protein, ur: NEGATIVE mg/dL
Specific Gravity, Urine: 1.007 (ref 1.005–1.030)
Urobilinogen, UA: 0.2 mg/dL (ref 0.0–1.0)
pH: 6.5 (ref 5.0–8.0)

## 2011-12-23 LAB — AMMONIA: Ammonia: 57 umol/L (ref 11–60)

## 2011-12-23 MED ORDER — QUETIAPINE FUMARATE 300 MG PO TABS
600.0000 mg | ORAL_TABLET | Freq: Every day | ORAL | Status: DC
  Administered 2011-12-23: 600 mg via ORAL
  Filled 2011-12-23: qty 2

## 2011-12-23 MED ORDER — FAMOTIDINE 20 MG PO TABS
20.0000 mg | ORAL_TABLET | Freq: Two times a day (BID) | ORAL | Status: DC
  Administered 2011-12-23 (×2): 20 mg via ORAL
  Filled 2011-12-23: qty 1

## 2011-12-23 MED ORDER — RENA-VITE PO TABS
1.0000 | ORAL_TABLET | Freq: Every day | ORAL | Status: DC
  Administered 2011-12-23: 1 via ORAL
  Filled 2011-12-23 (×2): qty 1

## 2011-12-23 MED ORDER — VITAMIN B COMPLEX-C PO CAPS: 1.0000 | ORAL_CAPSULE | Freq: Every day | ORAL | Status: DC

## 2011-12-23 MED ORDER — SPIRONOLACTONE 100 MG PO TABS
100.0000 mg | ORAL_TABLET | Freq: Every day | ORAL | Status: DC
  Administered 2011-12-23: 100 mg via ORAL
  Filled 2011-12-23 (×2): qty 1

## 2011-12-23 MED ORDER — B COMPLEX-C PO TABS
1.0000 | ORAL_TABLET | Freq: Every day | ORAL | Status: DC
  Administered 2011-12-23: 1 via ORAL
  Filled 2011-12-23 (×2): qty 1

## 2011-12-23 MED ORDER — RIFAXIMIN 550 MG PO TABS
550.0000 mg | ORAL_TABLET | Freq: Two times a day (BID) | ORAL | Status: DC
  Administered 2011-12-23: 550 mg via ORAL
  Filled 2011-12-23 (×3): qty 1

## 2011-12-23 MED ORDER — IBUPROFEN 200 MG PO TABS: 600.0000 mg | ORAL_TABLET | Freq: Three times a day (TID) | ORAL | Status: DC | PRN

## 2011-12-23 MED ORDER — FUROSEMIDE 20 MG PO TABS
20.0000 mg | ORAL_TABLET | Freq: Two times a day (BID) | ORAL | Status: DC
  Administered 2011-12-23: 20 mg via ORAL
  Filled 2011-12-23 (×3): qty 1

## 2011-12-23 MED ORDER — FERROUS SULFATE 325 (65 FE) MG PO TABS
325.0000 mg | ORAL_TABLET | Freq: Every day | ORAL | Status: DC
  Filled 2011-12-23 (×2): qty 1

## 2011-12-23 MED ORDER — ALUM & MAG HYDROXIDE-SIMETH 200-200-20 MG/5ML PO SUSP: 30.0000 mL | ORAL | Status: DC | PRN

## 2011-12-23 MED ORDER — ZOLPIDEM TARTRATE 5 MG PO TABS: 5.0000 mg | ORAL_TABLET | Freq: Every evening | ORAL | Status: DC | PRN

## 2011-12-23 MED ORDER — ZIPRASIDONE HCL 20 MG PO CAPS: 20.0000 mg | ORAL_CAPSULE | Freq: Every day | ORAL | Status: DC

## 2011-12-23 MED ORDER — LORAZEPAM 1 MG PO TABS
1.0000 mg | ORAL_TABLET | Freq: Three times a day (TID) | ORAL | Status: DC | PRN
  Administered 2011-12-23: 1 mg via ORAL
  Filled 2011-12-23: qty 1

## 2011-12-23 MED ORDER — LACTULOSE 10 GM/15ML PO SOLN
20.0000 g | Freq: Two times a day (BID) | ORAL | Status: DC
  Filled 2011-12-23 (×3): qty 30

## 2011-12-23 MED ORDER — DIVALPROEX SODIUM 250 MG PO DR TAB
250.0000 mg | DELAYED_RELEASE_TABLET | Freq: Two times a day (BID) | ORAL | Status: DC
  Administered 2011-12-23 (×2): 250 mg via ORAL
  Filled 2011-12-23 (×2): qty 1

## 2011-12-23 MED ORDER — ONDANSETRON HCL 4 MG PO TABS: 4.0000 mg | ORAL_TABLET | Freq: Every day | ORAL | Status: DC

## 2011-12-23 MED ORDER — PANTOPRAZOLE SODIUM 40 MG PO TBEC: 40.0000 mg | DELAYED_RELEASE_TABLET | Freq: Every day | ORAL | Status: DC

## 2011-12-23 NOTE — ED Notes (Signed)
Patient refused this Clinical research associate to draw labs. RN made aware.
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
## 2011-12-23 NOTE — ED Notes (Signed)
Patient is asking to use the phone constantly.  Pt is anxious and irritated.  His mood in hostile at times. Will continue to monitor patient.

## 2011-12-23 NOTE — ED Notes (Signed)
Patient refused to let me and the RN place him in a hospital gown at this time.

## 2011-12-23 NOTE — ED Notes (Signed)
Pt came by EMS. Mother called 911 to have this patient admitted to a psy facility because pt threaten to kill his mother and to kill himself, Pt IVC. Police want the EMS to take this pt to [facility] but doesn't transport pt to any psy facility. Therefore pt ended up here. Pt has chronic liver disease, ascites noted on his abdomen. When ask why his mother called the police, patient said it is because his mom is old and doesn't know what she is talking about. Per police, pt and his mother always argue, never had an ideal relationship. Pt in the room denied SI, HI or any previous attempt in the past.

## 2011-12-24 ENCOUNTER — Observation Stay (HOSPITAL_COMMUNITY)
Admission: EM | Admit: 2011-12-24 | Discharge: 2011-12-31 | Payer: Medicaid Other | Attending: Internal Medicine | Admitting: Internal Medicine

## 2011-12-24 ENCOUNTER — Encounter (HOSPITAL_COMMUNITY): Payer: Self-pay | Admitting: *Deleted

## 2011-12-24 DIAGNOSIS — R45850 Homicidal ideations: Secondary | ICD-10-CM

## 2011-12-24 DIAGNOSIS — R531 Weakness: Secondary | ICD-10-CM

## 2011-12-24 DIAGNOSIS — K769 Liver disease, unspecified: Secondary | ICD-10-CM | POA: Diagnosis present

## 2011-12-24 DIAGNOSIS — N289 Disorder of kidney and ureter, unspecified: Secondary | ICD-10-CM | POA: Diagnosis present

## 2011-12-24 DIAGNOSIS — I959 Hypotension, unspecified: Secondary | ICD-10-CM | POA: Insufficient documentation

## 2011-12-24 DIAGNOSIS — R188 Other ascites: Secondary | ICD-10-CM | POA: Diagnosis present

## 2011-12-24 DIAGNOSIS — F4310 Post-traumatic stress disorder, unspecified: Secondary | ICD-10-CM | POA: Diagnosis present

## 2011-12-24 DIAGNOSIS — F609 Personality disorder, unspecified: Secondary | ICD-10-CM | POA: Diagnosis present

## 2011-12-24 DIAGNOSIS — Z8639 Personal history of other endocrine, nutritional and metabolic disease: Secondary | ICD-10-CM | POA: Diagnosis present

## 2011-12-24 DIAGNOSIS — B1920 Unspecified viral hepatitis C without hepatic coma: Secondary | ICD-10-CM | POA: Diagnosis present

## 2011-12-24 DIAGNOSIS — D649 Anemia, unspecified: Secondary | ICD-10-CM | POA: Diagnosis present

## 2011-12-24 DIAGNOSIS — F259 Schizoaffective disorder, unspecified: Secondary | ICD-10-CM | POA: Diagnosis present

## 2011-12-24 DIAGNOSIS — K922 Gastrointestinal hemorrhage, unspecified: Secondary | ICD-10-CM

## 2011-12-24 DIAGNOSIS — K219 Gastro-esophageal reflux disease without esophagitis: Secondary | ICD-10-CM | POA: Diagnosis present

## 2011-12-24 DIAGNOSIS — F17200 Nicotine dependence, unspecified, uncomplicated: Secondary | ICD-10-CM | POA: Insufficient documentation

## 2011-12-24 DIAGNOSIS — F29 Unspecified psychosis not due to a substance or known physiological condition: Principal | ICD-10-CM | POA: Diagnosis present

## 2011-12-24 DIAGNOSIS — E039 Hypothyroidism, unspecified: Secondary | ICD-10-CM | POA: Diagnosis present

## 2011-12-24 LAB — CBC WITH DIFFERENTIAL/PLATELET
Basophils Relative: 1 % (ref 0–1)
Eosinophils Absolute: 0.1 10*3/uL (ref 0.0–0.7)
HCT: 27.3 % — ABNORMAL LOW (ref 39.0–52.0)
Hemoglobin: 9 g/dL — ABNORMAL LOW (ref 13.0–17.0)
MCH: 29.2 pg (ref 26.0–34.0)
MCHC: 33 g/dL (ref 30.0–36.0)
Monocytes Absolute: 0.5 10*3/uL (ref 0.1–1.0)
Monocytes Relative: 13 % — ABNORMAL HIGH (ref 3–12)
Neutrophils Relative %: 50 % (ref 43–77)
RDW: 17.3 % — ABNORMAL HIGH (ref 11.5–15.5)

## 2011-12-24 LAB — BASIC METABOLIC PANEL
BUN: 28 mg/dL — ABNORMAL HIGH (ref 6–23)
Creatinine, Ser: 2.35 mg/dL — ABNORMAL HIGH (ref 0.50–1.35)
GFR calc Af Amer: 35 mL/min — ABNORMAL LOW (ref >90)
GFR calc non Af Amer: 30 mL/min — ABNORMAL LOW (ref >90)

## 2011-12-24 MED ORDER — RIFAXIMIN 550 MG PO TABS
550.0000 mg | ORAL_TABLET | Freq: Two times a day (BID) | ORAL | Status: DC
  Administered 2011-12-24 – 2011-12-31 (×14): 550 mg via ORAL
  Filled 2011-12-24 (×17): qty 1

## 2011-12-24 MED ORDER — QUETIAPINE FUMARATE 300 MG PO TABS
600.0000 mg | ORAL_TABLET | Freq: Every day | ORAL | Status: DC
  Administered 2011-12-24 – 2011-12-30 (×7): 600 mg via ORAL
  Filled 2011-12-24 (×8): qty 2

## 2011-12-24 MED ORDER — PANTOPRAZOLE SODIUM 40 MG PO TBEC
40.0000 mg | DELAYED_RELEASE_TABLET | Freq: Every day | ORAL | Status: DC
  Administered 2011-12-25 – 2011-12-29 (×4): 40 mg via ORAL
  Filled 2011-12-24 (×7): qty 1

## 2011-12-24 MED ORDER — LACTULOSE 10 GM/15ML PO SOLN
20.0000 g | Freq: Two times a day (BID) | ORAL | Status: DC
  Administered 2011-12-24 – 2011-12-27 (×3): 20 g via ORAL
  Filled 2011-12-24 (×17): qty 30

## 2011-12-24 MED ORDER — SPIRONOLACTONE 100 MG PO TABS
100.0000 mg | ORAL_TABLET | Freq: Every day | ORAL | Status: DC
  Administered 2011-12-25 – 2011-12-30 (×6): 100 mg via ORAL
  Filled 2011-12-24 (×8): qty 1

## 2011-12-24 MED ORDER — ALUM & MAG HYDROXIDE-SIMETH 200-200-20 MG/5ML PO SUSP: 30.0000 mL | ORAL | Status: DC | PRN

## 2011-12-24 MED ORDER — CIPROFLOXACIN HCL 250 MG PO TABS
250.0000 mg | ORAL_TABLET | ORAL | Status: DC
  Administered 2011-12-29: 250 mg via ORAL
  Filled 2011-12-24: qty 1
  Filled 2011-12-24: qty 0.5

## 2011-12-24 MED ORDER — ZIPRASIDONE MESYLATE 20 MG IM SOLR
10.0000 mg | Freq: Once | INTRAMUSCULAR | Status: AC
  Administered 2011-12-24: 10 mg via INTRAMUSCULAR

## 2011-12-24 MED ORDER — DIVALPROEX SODIUM 250 MG PO DR TAB
250.0000 mg | DELAYED_RELEASE_TABLET | Freq: Two times a day (BID) | ORAL | Status: DC
  Administered 2011-12-24 – 2011-12-31 (×14): 250 mg via ORAL
  Filled 2011-12-24 (×15): qty 1

## 2011-12-24 MED ORDER — ACETAMINOPHEN 325 MG PO TABS: 650.0000 mg | ORAL_TABLET | ORAL | Status: DC | PRN

## 2011-12-24 MED ORDER — ONDANSETRON HCL 4 MG PO TABS
4.0000 mg | ORAL_TABLET | Freq: Every day | ORAL | Status: DC
  Administered 2011-12-27 – 2011-12-30 (×2): 4 mg via ORAL
  Filled 2011-12-24 (×4): qty 1

## 2011-12-24 MED ORDER — ZIPRASIDONE HCL 20 MG PO CAPS
20.0000 mg | ORAL_CAPSULE | Freq: Every day | ORAL | Status: DC
  Administered 2011-12-25 – 2011-12-31 (×7): 20 mg via ORAL
  Filled 2011-12-24 (×10): qty 1

## 2011-12-24 MED ORDER — CIPROFLOXACIN HCL 500 MG PO TABS
500.0000 mg | ORAL_TABLET | ORAL | Status: DC
  Administered 2011-12-29: 500 mg via ORAL
  Filled 2011-12-24 (×2): qty 1

## 2011-12-24 MED ORDER — ZIPRASIDONE MESYLATE 20 MG IM SOLR
INTRAMUSCULAR | Status: AC
  Administered 2011-12-24: 10 mg via INTRAMUSCULAR
  Filled 2011-12-24: qty 20

## 2011-12-24 MED ORDER — ZIPRASIDONE MESYLATE 20 MG IM SOLR
INTRAMUSCULAR | Status: AC
Start: 2011-12-24 — End: 2011-12-25
  Filled 2011-12-24: qty 20

## 2011-12-24 NOTE — ED Notes (Signed)
Sitter at bedside.

## 2011-12-24 NOTE — Progress Notes (Signed)
[facility] ED CM reviewed case information with EDP, Campos Pt cleared medically and per psychiatrist but pt's mother the primary care giver, is refusing to have pt return to her home (feels unsafe) Pt has no other family willing to assist Pt not meeting for admission.  Plan further telepsych for competency and therapy evaluation

## 2011-12-24 NOTE — Progress Notes (Signed)
Cm left message for sw ([phone]) informing them of Tammy, [facility] SW contact number ([phone]) to possible assist with disposition.  Cm spoke with Tammy who confirms mother is presently refusing to have pt return to her home.  Tammy states she and her psychiatrist from [facility] [phone]- genta staff member) have been making home visits to pt Tammy states mother is now legal guardian for pt and is willing to consent for placement to hospice or snf.  T[facility] reports [facility] that will qualify for hospice PSI has also been involved with pt.  Pt had [facility] visiting but pt was d/c from their service for inappropriate behavior Tammy states pt is no longer home health candidate.  [facility] but tammy assisted with getting him and appointment with Dr Fleet Contras, [facility] for 01/13/12 Pt has a family interpreter to assist with translation Emi Holes at [phone]-leave a message if needed) CM left message for Cm Chiropodist, Kennon Rounds Cm spoke with Earna Coder, Armed forces training and education officer and reviewed case information with ED interventions (therapy and further telepsych evaluation) and potential d/c plans (snf or hospice) Questionable need for PASRR level 2 Cm updated Production assistant, radio, Consulting civil engineer and EDP, [employer]

## 2011-12-24 NOTE — ED Provider Notes (Signed)
History     CSN: [id number]  Arrival date & time 12/24/11  4098   First MD Initiated Contact with Patient 12/24/11 1001      No chief complaint on file.    HPI The pt presents back to the ER from home after being discharged earlier this morning.  He was seen yesterday in the emergency department because as reported that he threatened to kill his parents who care for him at home.  This patient is a long-standing history of cirrhosis and immobility.  He reports is only able to use a bedside commode.  He was seen by the psychiatrist last night and was cleared to go home from a psychiatric standpoint.  He was seen by the physician last night and clear from a medical standpoint.  The patient was discharged back home he was taken there by ambulance.  When the patient arrived at his house that he lives with his parents by ambulance the parents refused to take him back and stated that he had threatened to kill them and they would not allow him to live with them.  The patient was brought back to the emergency department for placement purposes.  The patient reports she's not going to kill his parents.  He would like to go home but his parents are refusing.  Patient states he does not drink alcohol.  He refuses to sit on the side of the bed and states that he can't.   Past Medical History  Diagnosis Date  . Kidney disease   . GERD (gastroesophageal reflux disease)   . Liver disease   . Hepatitis C   . Ascites   . Anemia   . PTSD (post-traumatic stress disorder)   . Personality disorder   . History of ARDS   . Hx of Clostridium difficile infection   . Schizoaffective disorder   . PTSD (post-traumatic stress disorder)     SECONDARY TO WAR IN [place]  . Overdose 12/2009  . Peripheral neuropathy   . Aspiration pneumonia     HX OF  . Pleural effusion, bilateral     HX OF  . History of blood transfusion   . Hypothyroidism   . History of adrenal insufficiency   . H/O diabetes insipidus      Past Surgical History  Procedure Date  . Right hip surgery 1996  . US guided left thoracentesis 01/21/2010    No family history on file.  History  Substance Use Topics  . Smoking status: Current Every Day Smoker -- 1.0 packs/day for 25 years    Types: Cigarettes  . Smokeless tobacco: Never Used  . Alcohol Use: No      Review of Systems  All other systems reviewed and are negative.    Allergies  Review of patient's allergies indicates no known allergies.  Home Medications   Current Outpatient Rx  Name Route Sig Dispense Refill  . NEPHRO-VITE 0.8 MG PO TABS Oral Take 0.8 mg by mouth 4 (four) times daily.    Marland Kitchen CIPROFLOXACIN HCL 250 MG PO TABS Oral Take 250 mg by mouth every Wednesday. Take with 500mg  to make 750    . CIPROFLOXACIN HCL 500 MG PO TABS Oral Take 500 mg by mouth every Wednesday. Take with 250mg  to make 750    . DIVALPROEX SODIUM 250 MG PO TBEC Oral Take 250 mg by mouth 2 (two) times daily.    Marland Kitchen FERROUS SULFATE 325 (65 FE) MG PO TABS Oral Take 325 mg by  mouth daily with breakfast.    . FUROSEMIDE 20 MG PO TABS Oral Take 20 mg by mouth 2 (two) times daily.    Marland Kitchen LACTULOSE 10 GM/15ML PO SOLN Oral Take 20 g by mouth 2 (two) times daily.    Marland Kitchen OMEPRAZOLE 20 MG PO CPDR Oral Take 20 mg by mouth 2 (two) times daily before a meal. Take with amoxicillin and clarithromycin    . ONDANSETRON HCL 4 MG PO TABS Oral Take 4 mg by mouth daily at 6 PM.    . QUETIAPINE FUMARATE 300 MG PO TABS Oral Take 600 mg by mouth at bedtime.    Marland Kitchen RANITIDINE HCL 150 MG PO TABS Oral Take 150 mg by mouth daily.    Marland Kitchen RIFAXIMIN 550 MG PO TABS Oral Take 550 mg by mouth 2 (two) times daily.    Marland Kitchen SPIRONOLACTONE 100 MG PO TABS Oral Take 100 mg by mouth daily.    Marland Kitchen VITAMIN B COMPLEX-C PO CAPS Oral Take 1 capsule by mouth daily.    Marland Kitchen ZIPRASIDONE HCL 20 MG PO CAPS Oral Take 1 capsule (20 mg total) by mouth daily after breakfast. 30 capsule 0    There were no vitals taken for this visit.  Physical  Exam  Nursing note and vitals reviewed. Constitutional: He is oriented to person, place, and time.       Chronically ill-appearing.  Obvious stigmata of cirrhosis  HENT:  Head: Normocephalic and atraumatic.  Eyes: EOM are normal.  Neck: Normal range of motion.  Cardiovascular: Normal rate, regular rhythm, normal heart sounds and intact distal pulses.   Pulmonary/Chest: Effort normal and breath sounds normal. No respiratory distress. He has no rales.  Abdominal: Soft. He exhibits no distension.       Ascites evident and abdominal compartment.  No frank abdominal tenderness  Musculoskeletal:       3+ pitting edema in his bilateral lower extremities  Neurological: He is alert and oriented to person, place, and time.  Skin: Skin is warm and dry.  Psychiatric: He has a normal mood and affect. Judgment normal.    ED Course  Procedures (including critical care time)  Labs Reviewed  CBC WITH DIFFERENTIAL - Abnormal; Notable for the following:    WBC 3.9 (*)     RBC 3.08 (*)     Hemoglobin 9.0 (*)     HCT 27.3 (*)     RDW 17.3 (*)     Platelets 137 (*)     Monocytes Relative 13 (*)     All other components within normal limits  BASIC METABOLIC PANEL   No results found.   No diagnosis found.    MDM  At this time it appears that the patient will need placement into rehabilitation or skilled nursing facility.  This is a very unfortunate case and am concerned about the patient's length of the stay in the emergency department.  Gotten a PT and OT consultation to evaluate his needs.  Have involved the Child psychotherapist as well as the nursing [facility].  2:39 PM telepsych recommends inpatient admission to psychiatric facility       Lyanne Co, MD 12/24/11 1440

## 2011-12-24 NOTE — ED Notes (Signed)
Please see note documented by this nurse.

## 2011-12-24 NOTE — ED Notes (Addendum)
Tammy RN with [facility] pt's SW [phone].  She reports that pt was given the option to go to Hospice, pt refused to sign.  She also reports that she's been talking with pt's mother re pt, she stated to Tammy that she can sign papers if she can.  Tammy left her number to have SW from ED to reach if needed.

## 2011-12-24 NOTE — ED Notes (Signed)
Resources given to patient along with discharge paper

## 2011-12-24 NOTE — ED Notes (Signed)
PTAR at bedside 

## 2011-12-24 NOTE — Progress Notes (Signed)
CSW received call on patient regarding disposition. CSW conferenced with CSW [employer]. CSW director states that patient can not be placed from ER with medicaid only and need for a level 2. Patient's mother can not refuse him back as she is his legal guardian and he is medically and psychiatrically cleared. Mother will need to work on placing patient from home. CSW informed case manager, Selena Batten, of same.  [name] C. Shadaya Marschner MSW, LCSW [phone]

## 2011-12-24 NOTE — Progress Notes (Signed)
Spoke with Lowella Dandy RN who confirms only been able to leave a message for [facility] therapy staff.  CM spoke with Toniann Fail at ext [phone] who states she will contact [facility] therapy to have pt seen as soon as possible.

## 2011-12-24 NOTE — Progress Notes (Signed)
Physical Therapy Note  Order received and chart reviewed.  Spoke with pt at bedside and he reports he is unable to participate in therapy at this time.  Pt states he is not able to move from bed and not agreeable to even attempt sitting EOB.  Pt seemed upset and distressed about not being accepted back home this morning and perseverating on this.  Listened to pt speak and informed him of PT's role in acute care, however pt continued to report he was not able to move at this time and declined physical therapy.  Will sign off.  Please re-order if pt becomes agreeable.  Zenovia Jarred, PT Pager: [phone]

## 2011-12-24 NOTE — ED Notes (Signed)
Pt transported back to the ED via PTAR, reports pt's mother refused to accept pt back in the house, [employer] was on scene.  PTAR reports that mother stated that her and her husband did not feel safe with him in the house.  Dr. Patria Mane notified.

## 2011-12-24 NOTE — ED Provider Notes (Signed)
PSYCH CONSULT RECOMMENDATIONS REVIEWED AS BELOW 2:56 AM   DR Jacky Kindle, PSY, recs OK for discharge with reversal of commitment and refer to [facility] for outpatient follow up. Patient is not threat to himself or others at this time.   Results for orders placed during the hospital encounter of 12/23/11  CBC      Component Value Range   WBC 3.7 (*) 4.0 - 10.5 K/uL   RBC 3.52 (*) 4.22 - 5.81 MIL/uL   Hemoglobin 10.1 (*) 13.0 - 17.0 g/dL   HCT 45.4 (*) 09.8 - 11.9 %   MCV 89.2  78.0 - 100.0 fL   MCH 28.7  26.0 - 34.0 pg   MCHC 32.2  30.0 - 36.0 g/dL   RDW 14.7 (*) 82.9 - 56.2 %   Platelets 137 (*) 150 - 400 K/uL  COMPREHENSIVE METABOLIC PANEL      Component Value Range   Sodium 132 (*) 135 - 145 mEq/L   Potassium 4.8  3.5 - 5.1 mEq/L   Chloride 98  96 - 112 mEq/L   CO2 24  19 - 32 mEq/L   Glucose, Bld 93  70 - 99 mg/dL   BUN 25 (*) 6 - 23 mg/dL   Creatinine, Ser 1.30 (*) 0.50 - 1.35 mg/dL   Calcium 9.4  8.4 - 86.5 mg/dL   Total Protein 6.4  6.0 - 8.3 g/dL   Albumin 2.5 (*) 3.5 - 5.2 g/dL   AST 20  0 - 37 U/L   ALT 8  0 - 53 U/L   Alkaline Phosphatase 75  39 - 117 U/L   Total Bilirubin 0.3  0.3 - 1.2 mg/dL   GFR calc non Af Amer 33 (*) >90 mL/min   GFR calc Af Amer 38 (*) >90 mL/min  LIPASE, BLOOD      Component Value Range   Lipase 43  11 - 59 U/L  AMMONIA      Component Value Range   Ammonia 57  11 - 60 umol/L  ETHANOL      Component Value Range   Alcohol, Ethyl (B) <11  0 - 11 mg/dL  URINE RAPID DRUG SCREEN (HOSP PERFORMED)      Component Value Range   Opiates NONE DETECTED  NONE DETECTED   Cocaine NONE DETECTED  NONE DETECTED   Benzodiazepines NONE DETECTED  NONE DETECTED   Amphetamines NONE DETECTED  NONE DETECTED   Tetrahydrocannabinol NONE DETECTED  NONE DETECTED   Barbiturates NONE DETECTED  NONE DETECTED  URINALYSIS, ROUTINE W REFLEX MICROSCOPIC      Component Value Range   Color, Urine YELLOW  YELLOW   APPearance CLOUDY (*) CLEAR   Specific  Gravity, Urine 1.007  1.005 - 1.030   pH 6.5  5.0 - 8.0   Glucose, UA NEGATIVE  NEGATIVE mg/dL   Hgb urine dipstick NEGATIVE  NEGATIVE   Bilirubin Urine NEGATIVE  NEGATIVE   Ketones, ur NEGATIVE  NEGATIVE mg/dL   Protein, ur NEGATIVE  NEGATIVE mg/dL   Urobilinogen, UA 0.2  0.0 - 1.0 mg/dL   Nitrite NEGATIVE  NEGATIVE   Leukocytes, UA NEGATIVE  NEGATIVE    referrals provided, is deemed appropriate for discharge at this time.      Sunnie Nielsen, MD 12/24/11 [phone]

## 2011-12-24 NOTE — ED Notes (Signed)
Dr. Rulon Abide notified pt aggressive, throwing drinks; geodon 10 mg IM given.  RN continuing to monitor.

## 2011-12-24 NOTE — Progress Notes (Signed)
Attempt to reach pt's mother Shaka Cardin, Mother [phone] without success

## 2011-12-24 NOTE — ED Notes (Signed)
Pt has low Manual BP of 80/66, Dr. Patria Mane informed and instructed pt to eat and drink but pt refused to eat or drink. Dr. Patria Mane came to room and evaluated the patient, pt had no complaint nor any symptoms. Dr. Patria Mane okay discharging this patient. PTAR Called.

## 2011-12-25 ENCOUNTER — Encounter (HOSPITAL_COMMUNITY): Payer: Self-pay | Admitting: *Deleted

## 2011-12-25 MED ORDER — LORAZEPAM 1 MG PO TABS
1.0000 mg | ORAL_TABLET | Freq: Once | ORAL | Status: AC
Start: 2011-12-25 — End: 2011-12-25
  Administered 2011-12-25: 1 mg via ORAL
  Filled 2011-12-25: qty 1

## 2011-12-25 MED ORDER — ONDANSETRON 4 MG PO TBDP
ORAL_TABLET | ORAL | Status: AC
  Administered 2011-12-25: 4 mg
  Filled 2011-12-25: qty 1

## 2011-12-25 NOTE — Progress Notes (Signed)
OT Note Defer to PT note below. Please re-order if pt becomes agreeable to therapy  Garrel Ridgel, OTR/L  Pager [phone] 12/25/2011

## 2011-12-25 NOTE — BHH Counselor (Signed)
[name], social worker at [employer], submitted Pt for admission to [name]. [name], [facility] consulted with Dr. Mervyn Gay who said he was familiar with this Pt and that he was declined at [facility] due to his acuity. [name], assessment counselor at [facility], was notified of disposition.  [name], LPC

## 2011-12-25 NOTE — BH Assessment (Signed)
Assessment Note   [name] is an 51 y.o. male who initially presented to [facility] Emergency Department with the c/o of homicidal ideations towards his mother per IVC. Per IVC, patient was declared incompetent in September of 2012. IVC reports that pt was throwing items from his room at his mother and also stated he wanted to cut his mother's head off. IVC also reports that patient has threatened to kill himself as well. During assessment patient was observed by writer to exhibit labile moods, evidenced by episodes of tearfulness then abrupt occurrences of anger. Patient verbalized to Clinical research associate that he desires to go home to be with his mother and discounts every saying that he wanted to "chop her head off". Patient stated that he has a strong relationship with his mother and is concerned about her because she is in her late 33s. "She will die very soon. I want to see her one more time before she dies. I love her." Patient exhibits no recall of memory or understanding as to why he is currently in the ED. "I am medically and physically ok. I need to go." Patient denies suicidal ideations or the desire to cut his mother's head off currently. Patient has noted history of inpatient treatment for mental health at [facility] and [facility]. Patient denies SA use and AVH at this time. Telepsych recommends inpatient treatment for stabilization.  Axis I: Post Traumatic Stress Disorder and Schizoaffective Disorder Axis II: Deferred Axis III:  Past Medical History  Diagnosis Date  . Kidney disease   . GERD (gastroesophageal reflux disease)   . Liver disease   . Hepatitis C   . Ascites   . Anemia   . PTSD (post-traumatic stress disorder)   . Personality disorder   . History of ARDS   . Hx of Clostridium difficile infection   . Schizoaffective disorder   . PTSD (post-traumatic stress disorder)     SECONDARY TO WAR IN [place]  . Overdose 12/2009  . Peripheral neuropathy   . Aspiration pneumonia     HX OF  . Pleural  effusion, bilateral     HX OF  . History of blood transfusion   . Hypothyroidism   . History of adrenal insufficiency   . H/O diabetes insipidus    Axis IV: other psychosocial or environmental problems, problems related to social environment and problems with primary support group Axis V: 41-50 serious symptoms  Past Medical History:  Past Medical History  Diagnosis Date  . Kidney disease   . GERD (gastroesophageal reflux disease)   . Liver disease   . Hepatitis C   . Ascites   . Anemia   . PTSD (post-traumatic stress disorder)   . Personality disorder   . History of ARDS   . Hx of Clostridium difficile infection   . Schizoaffective disorder   . PTSD (post-traumatic stress disorder)     SECONDARY TO WAR IN [place]  . Overdose 12/2009  . Peripheral neuropathy   . Aspiration pneumonia     HX OF  . Pleural effusion, bilateral     HX OF  . History of blood transfusion   . Hypothyroidism   . History of adrenal insufficiency   . H/O diabetes insipidus     Past Surgical History  Procedure Date  . Right hip surgery 1996  . US guided left thoracentesis 01/21/2010    Family History: History reviewed. No pertinent family history.  Social History:  reports that he has been smoking Cigarettes.  He has a 25 pack-year smoking history. He has never used smokeless tobacco. He reports that he does not drink alcohol or use illicit drugs.  Additional Social History:  Alcohol / Drug Use Pain Medications: See MAR Prescriptions: See MAR Over the Counter: See MAR History of alcohol / drug use?: No history of alcohol / drug abuse  CIWA: CIWA-Ar BP: 111/70 mmHg Pulse Rate: 98  COWS:    Allergies: No Known Allergies  Home Medications:  (Not in a hospital admission)  OB/GYN Status:  No LMP for male patient.  General Assessment Data Location of Assessment: [facility] Living Arrangements: Parent Can pt return to current living arrangement?: Yes Admission Status: Voluntary Is  patient capable of signing voluntary admission?: No Transfer from: Home  Education Status Is patient currently in school?: No  Risk to self Suicidal Ideation: No-Not Currently/Within Last 6 Months Suicidal Intent: No-Not Currently/Within Last 6 Months Is patient at risk for suicide?: Yes Suicidal Plan?: No Access to Means: Yes (Unknown) Specify Access to Suicidal Means: Access to streets What has been your use of drugs/alcohol within the last 12 months?: Pt denies Previous Attempts/Gestures:  (Unknown) How many times?:  (Unknown) Other Self Harm Risks: None Triggers for Past Attempts: None known Intentional Self Injurious Behavior: None Family Suicide History: Unable to assess Recent stressful life event(s): Conflict (Comment) (Relational stressors with parents) Persecutory voices/beliefs?: No Depression: Yes Depression Symptoms: Tearfulness;Feeling worthless/self pity;Loss of interest in usual pleasures Substance abuse history and/or treatment for substance abuse?: No Suicide prevention information given to non-admitted patients: Not applicable  Risk to Others Homicidal Ideation: No-Not Currently/Within Last 6 Months Thoughts of Harm to Others: No-Not Currently Present/Within Last 6 Months Current Homicidal Intent: No-Not Currently/Within Last 6 Months Current Homicidal Plan: No-Not Currently/Within Last 6 Months Access to Homicidal Means: Yes Describe Access to Homicidal Means:  (Sharp objects within the home) Identified Victim: None Reported History of harm to others?: No Assessment of Violence: None Noted Violent Behavior Description: When angry pt yells as an act of aggrsssion Does patient have access to weapons?: No Criminal Charges Pending?: No Does patient have a court date: No  Psychosis Hallucinations: None noted Delusions: None noted  Mental Status Report Appear/Hygiene: Disheveled Eye Contact: Poor Motor Activity: Freedom of movement Speech:  Logical/coherent Level of Consciousness: Quiet/awake;Sleeping Mood: Sad Affect: Sad;Depressed;Angry Anxiety Level: Minimal Thought Processes: Coherent Judgement: Impaired Orientation: Person;Place;Time;Situation Obsessive Compulsive Thoughts/Behaviors: None  Cognitive Functioning Concentration: Decreased Memory: Recent Intact;Remote Intact IQ: Average Insight: Poor Impulse Control: Poor Appetite: Fair Weight Loss: 0  Weight Gain: 0  Sleep:  (UTA) Total Hours of Sleep:  (Unk) Vegetative Symptoms: None  ADLScreening [facility]) Patient's cognitive ability adequate to safely complete daily activities?: Yes Patient able to express need for assistance with ADLs?: Yes Independently performs ADLs?: No  Abuse/Neglect [facility]) Physical Abuse: Denies Verbal Abuse: Denies Sexual Abuse: Denies  Prior Inpatient Therapy Prior Inpatient Therapy: Yes Prior Therapy Dates: Several years Prior Therapy Facilty/Provider([facility]): [facility]. Reason for Treatment: Pysch.   Prior Outpatient Therapy Prior Outpatient Therapy: Yes Prior Therapy Dates:  (unknown) Prior Therapy Facilty/Provider(s): [facility]   ADL Screening (condition at time of admission) Patient's cognitive ability adequate to safely complete daily activities?: Yes Patient able to express need for assistance with ADLs?: Yes Independently performs ADLs?: No Communication: Independent Is this a change from baseline?: Pre-admission baseline Dressing (OT): Needs assistance Is this a change from baseline?: Pre-admission baseline Grooming: Needs assistance Is this a change from baseline?: Pre-admission baseline  Feeding: Needs assistance Is this a change from baseline?: Pre-admission baseline Bathing: Needs assistance Is this a change from baseline?: Pre-admission baseline Toileting: Needs assistance Is this a change from baseline?: Pre-admission baseline In/Out Bed: Needs assistance Is this a change from  baseline?: Pre-admission baseline Walks in Home: Needs assistance Is this a change from baseline?: Pre-admission baseline Weakness of Arms/Hands: None       Abuse/Neglect Assessment (Assessment to be complete while patient is alone) Physical Abuse: Denies Verbal Abuse: Denies Sexual Abuse: Denies Exploitation of patient/patient's resources: Denies Self-Neglect: Denies Values / Beliefs Cultural Requests During Hospitalization: None Spiritual Requests During Hospitalization: None Consults Spiritual Care Consult Needed: No Social Work Consult Needed: No      Additional Information 1:1 In Past 12 Months?: No CIRT Risk: No Elopement Risk: No Does patient have medical clearance?: Yes     Disposition: Recommended by  Specialist on call for patient to receive inpatient treatment .  Disposition Disposition of Patient: Inpatient treatment program Type of inpatient treatment program: Adult  On Site Evaluation by: Self  Reviewed with Physician:     Paulino Door, Dennette Faulconer C 12/25/2011 11:40 AM

## 2011-12-25 NOTE — ED Notes (Signed)
Report received on pt. Pt asleep in bed at this time. resp even and unlabored. Sitter at bedside. Will continue to monitor.

## 2011-12-25 NOTE — ED Provider Notes (Addendum)
Pt resting quietly, nad. Discussed w act team - telepsych has recommended inpt psych tx, placement pending.   Suzi Roots, MD 12/25/11 0732  Pt alert, content. Nad.  Act placement pending.   Suzi Roots, MD 12/26/11 [phone]

## 2011-12-25 NOTE — ED Notes (Signed)
[facility] referral completed. Authorization number is : [id number] 12/25/11-12/31/11  Janann Colonel., MSW, [facility] Clinical Social Worker [phone]

## 2011-12-25 NOTE — BHH Counselor (Signed)
ACT attempted to assess pt. Pt is asleep and is unable to be assessed at this time. Will attempt again at a later time.

## 2011-12-26 LAB — CBC
HCT: 27.4 % — ABNORMAL LOW (ref 39.0–52.0)
Hemoglobin: 8.9 g/dL — ABNORMAL LOW (ref 13.0–17.0)
MCHC: 32.5 g/dL (ref 30.0–36.0)
RDW: 17.5 % — ABNORMAL HIGH (ref 11.5–15.5)
WBC: 2.9 10*3/uL — ABNORMAL LOW (ref 4.0–10.5)

## 2011-12-26 LAB — COMPREHENSIVE METABOLIC PANEL
ALT: 10 U/L (ref 0–53)
Albumin: 2.3 g/dL — ABNORMAL LOW (ref 3.5–5.2)
Alkaline Phosphatase: 65 U/L (ref 39–117)
BUN: 27 mg/dL — ABNORMAL HIGH (ref 6–23)
Chloride: 103 mEq/L (ref 96–112)
Potassium: 4.9 mEq/L (ref 3.5–5.1)
Sodium: 137 mEq/L (ref 135–145)
Total Bilirubin: 0.3 mg/dL (ref 0.3–1.2)
Total Protein: 6 g/dL (ref 6.0–8.3)

## 2011-12-26 NOTE — ED Notes (Signed)
Labs pending since 0721 -  Pt doesn't like women.  Renata Caprice RN comes in at 66 - Dr ok'd [place] waiting until then for labs to be drawn.

## 2011-12-27 LAB — CBC
HCT: 26.5 % — ABNORMAL LOW (ref 39.0–52.0)
Hemoglobin: 8.7 g/dL — ABNORMAL LOW (ref 13.0–17.0)
MCH: 29.3 pg (ref 26.0–34.0)
MCHC: 32.8 g/dL (ref 30.0–36.0)
MCV: 89.2 fL (ref 78.0–100.0)
Platelets: 154 10*3/uL (ref 150–400)
RBC: 2.97 MIL/uL — ABNORMAL LOW (ref 4.22–5.81)
RDW: 17.5 % — ABNORMAL HIGH (ref 11.5–15.5)
WBC: 3.2 10*3/uL — ABNORMAL LOW (ref 4.0–10.5)

## 2011-12-27 NOTE — Progress Notes (Addendum)
CSW called [facility] as requested by ACT team, to assist with pt dc plans. [facility] admission Larita Fife stated that pt referral is currently with medical doctor at [facility] and unable to confirm tests needed. [facility] was able to confirm that patient needs Fecal occult blood test. MD informed.  [facility] admissions asked CSW or ACT to call back in the morning to confirm if any other information is needed at this time. [facility] is stating that beds available are 10 days out at this time.   Catha Gosselin, LCSWA  [phone] 12/27/2011 20:33pm

## 2011-12-27 NOTE — Progress Notes (Signed)
CSW met with pt at bedside to assess pt current social work needs. CSW received referral that patient continued to ask for csw regarding pt dc plans. Pt asked CSW, "when was patient going to be able to leave?" CSW explained to patient that csw is awaiting bed availability at [facility] for pt to transfer to. CSW explained that pt MD is recommending that for him and pt guardian who is pt mother is aware. Pt asked, "Can you please call my mother and bring the phone in here?" CSW attempted to give pt phone to call pt mother, however pt wanted csw to speak with pt mother. There was no answer at number for pt mother/guardian. CSW and pt agreed to attempt to contact patient mother/guardian at later time. Pt stated, "thank you for your help".   .Clinical social worker continuing to follow pt to assist with pt dc plans and further csw needs.   Catha Gosselin, LCSWA  [phone] .12/27/2011 20:10

## 2011-12-27 NOTE — ED Provider Notes (Addendum)
Filed Vitals:   12/27/11 0624  BP: 88/54  Pulse: 77  Temp: 98.4 F (36.9 C)  Resp: 18   [name] sleeping in bed this am. NAD. Remains with disposition issues because mother unwilling to have pt come back to stay with her.  Raeford Razor, MD 12/27/11 0818  12:28 PM  [facility] requesting additional blood work. Pt with known cirrhosis and renal failure. Not acute issues. No indication for repeat blood work aside from CBC. Also questioning reason for ciprofloxacin.Given dosing of once weekly and hx of ascites, this is most likely prophylaxis for SBP.   Raeford Razor, MD 12/27/11 1229  Filed Vitals:   12/28/11 0131  BP: 96/69  Pulse: 96  Temp: 98.3 F (36.8 C)  Resp: 16   Pt sleeping. Non labored breathing. NAD. Placement pending.  Raeford Razor, MD 12/28/11 0719  11:30 AM Rectal exam done. Soft brown stool but strongly heme+. With h/h trending down (10.1, 9.0, 8.9, 8.7) over the past 5 days and positive stool will have to medically admit. Has been placement issue previously. Apparently talks about being made ward of state on last admit and DC to SNF but for whatever reason pt went back to stay with mother. Mother now unwilling to have pt stay with her and won't be able to place for psych reason with evidence of GI bleed.  Raeford Razor, MD 12/28/11 [phone]

## 2011-12-27 NOTE — BHH Counselor (Signed)
TC with Erskine Squibb @ [facility]. Stated the MD reviewed the packet sent and is requesting a Fecal Occult Blood Test and a Coagulation Panel. Also need IVC paperwork.

## 2011-12-27 NOTE — BHH Counselor (Addendum)
TC with Sheryl @ [facility] to confirm pt on wait list. Didn't see any information listed for this pt. Began another registration, but then stopped. Transferred me to [place], Charity fundraiser. Stated they needed daily progress notes on pt to include his problem/medical history listing; listing of vitals; indications for need of medications - especially Cipro; more recent CBC due to decreasing HCT and renal function to r/o chronic or acute liver disease. Need these before can get pt officially on the wait list.

## 2011-12-28 ENCOUNTER — Encounter (HOSPITAL_COMMUNITY): Payer: Self-pay

## 2011-12-28 DIAGNOSIS — F4310 Post-traumatic stress disorder, unspecified: Secondary | ICD-10-CM | POA: Diagnosis present

## 2011-12-28 DIAGNOSIS — Z8639 Personal history of other endocrine, nutritional and metabolic disease: Secondary | ICD-10-CM | POA: Diagnosis present

## 2011-12-28 DIAGNOSIS — E039 Hypothyroidism, unspecified: Secondary | ICD-10-CM | POA: Diagnosis present

## 2011-12-28 LAB — CBC
MCH: 29.2 pg (ref 26.0–34.0)
Platelets: 165 10*3/uL (ref 150–400)
RBC: 3.18 MIL/uL — ABNORMAL LOW (ref 4.22–5.81)
WBC: 3.1 10*3/uL — ABNORMAL LOW (ref 4.0–10.5)

## 2011-12-28 LAB — BASIC METABOLIC PANEL
CO2: 26 mEq/L (ref 19–32)
Calcium: 9.6 mg/dL (ref 8.4–10.5)
Chloride: 101 mEq/L (ref 96–112)
Potassium: 4.9 mEq/L (ref 3.5–5.1)
Sodium: 133 mEq/L — ABNORMAL LOW (ref 135–145)

## 2011-12-28 NOTE — BHH Counselor (Signed)
TC from [place], Resident at [facility]. Reviewed the CBC Hemoglobin results from 12/27/28 at 3:30pm. Stated pt may be able to be accepted based on him being stable. He will have to run this by his attending and have the attending call the EDP in the morning. Updated CSW who was also involved in working on getting an MD to MD transfer/acceptance.

## 2011-12-28 NOTE — Progress Notes (Signed)
[name], is a 51 y.o. male,   MRN: [id number]  -  DOB - 1960/08/09  Outpatient Primary MD for the patient is Dorrene German, MD  in for    Chief Complaint  Patient presents with  . Medical Clearance     Blood pressure 100/71, pulse 90, temperature 98.3 F (36.8 C), temperature source Oral, resp. rate 18, SpO2 93.00%.  Principal Problem:  *Anemia Active Problems:  Psychosis  Personality disorder  Ascites  Liver disease  Hepatitis C  Kidney disease  GERD (gastroesophageal reflux disease)  Schizoaffective disorder  Hypothyroidism   Pt 51 yo with above medical hx who  came by EMS on 9/26. Mother called 911 to have this patient admitted to a psy facility because pt threaten to kill his mother and to kill himself, Pt IVC. Police wanted the EMS to take this [facility] to [facility] but doesn't transport pt to any psy facility. Therefore pt ended up here. Pt has chronic liver disease, ascites noted on his abdomen. When ask why his mother called the police, patient said it is because his mom is old and doesn't know what she is talking about. Per police, pt and his mother always argue, never had an ideal relationship. Pt in the room denied SI, HI or any previous attempt in the past. Mother reported SI so pt has sitter. Pt has denied this.  During his stay in ED awaiting placement, pt combative, refusing meds/treatment. On 9/27 pt discharged with plans for OP therapy. Ambulance took pt home where he lives with mother and she refused to take him. Brought back to ED. Evaluated by tele psy and IP treatment recommended. Has been waiting for placement. Today, facility requesting FOBT before accepting. FOBT "grossly positive" per ED MD. We have been asked to admit for GI bleed.   Work up in ED yields hg 8.7 which has been declining gradually since 8/13 when it was 10.2. No reports diarrhea, dark stool. Pt has not had BM in several days. Is refusing lactulose as well.   On exam, pt contrary, argumentative,  somewhat pale. NAD. Abdomen large, distended, firm, non-tender. Pt with no complaints. Does state that he does not plan to accept transfusion.   Pt hemodynamically stable. Will admit to medical bed for anemia work up.

## 2011-12-28 NOTE — Progress Notes (Signed)
ED CM spoke with Dr Juleen China about TCU RN and Dr Ardyth Harps voiced concerns. Recommended [place] speak with Dr Ardyth Harps.  CM updated CM Chiropodist Marcelle Overlie)

## 2011-12-28 NOTE — ED Notes (Signed)
Pt is not being admitted to the hospital, will monitor.

## 2011-12-28 NOTE — Progress Notes (Signed)
[facility] ED CM spoke with ED Director, Gavin Pound and Medical director, Dr Jacky Kindle.  Pt remains in [facility] ED for further assistance from SW and ACT for d/c disposition TCU RN updated

## 2011-12-28 NOTE — ED Notes (Signed)
[name], Child psychotherapist, in [place], stated that she would see pt as soon as possible, pt aware, will monitor.

## 2011-12-28 NOTE — Progress Notes (Signed)
Per discussion with CSW Asst. Director, MD to MD is requested. CSW called [facility] to identify the MD for our MD to discuss case. CSW awaiting return call from [facility] regarding MD to MD request. CSW will inform Dr. Ardyth Harps when MD and number is confirmed. .Clinical social worker continuing to follow pt to assist with pt dc plans and further csw needs.   Catha Gosselin, LCSWA  [phone] .12/28/2011 1747pm

## 2011-12-28 NOTE — ED Notes (Signed)
No IV needed per Toya Smothers, NP, awaiting admitting MD for further orders, will monitor.

## 2011-12-28 NOTE — ED Notes (Signed)
Pt is talking, very loudly, calling sitters and staff names and swearing0. Security talked to patient  To ask him to refrain from disorderly conduct.

## 2011-12-28 NOTE — ED Notes (Signed)
Pt spoke with mother on phone  

## 2011-12-28 NOTE — ED Notes (Signed)
Dr. Juleen China in to perform Hemoccult which was positive, per Dr. Juleen China, may have to admit pt medically, ACT team aware, will monitor.

## 2011-12-28 NOTE — Progress Notes (Signed)
[facility] ED CM reviewed EPIC labs after speaking with TCU RN and ACT team members

## 2011-12-28 NOTE — ED Notes (Signed)
Baxter Hire, Child psychotherapist called per pt request regarding placement after discharge from hospital/mental facility, pt reports that he does not want to live with his parents after discharge. Kristen informed this nurse that she would be here within the hour, pt verbalized understanding.

## 2011-12-28 NOTE — ED Notes (Signed)
ACT team and Dr. Elsie Saas in to speak with Dr. Denton Lank regarding placement of pt.

## 2011-12-28 NOTE — ED Notes (Signed)
Social worker in to see patient  

## 2011-12-28 NOTE — Progress Notes (Signed)
Per discussion of ACT team and [facility], pt may be still able to be admitted due to CBC from today per [facility] Resident. [facility] Resident to discuss with [facility] attending to determine if MD to MD is needed. CSW and ACT team to continue to assist with pt dc to [facility].   CSW received referral that patient wants placement after [facility] instead of returning home with pt mother. CSW will contact pt mother who is pt guardian and pt case worker through [place] to assist with pt placement once pt is cleared or completes [facility].   .Clinical social worker continuing to follow pt to assist with pt dc plans and further csw needs.   Catha Gosselin, LCSWA  [phone] .12/28/2011 1827pm

## 2011-12-28 NOTE — BHH Counselor (Signed)
First Examination from IVC was not completed, assisted EDP to have this completed. Faxed copies of this with requested lab results to [facility].

## 2011-12-28 NOTE — Progress Notes (Addendum)
A member of our team was called by ED requesting medical admission for this patient. He has been in the ED for 4 days awaiting inpatient psych admission as he was homicidal and his mother refused to take him back home. It appears [facility] requested additional information, including an FOBT. which was positive. Hb is currently 8.7, was 9.0 4 days ago and 10.1 on 8/13. Upon evaluation of the patient by my colleague, he refused any treatment for this anemia including blood transfusion and possibly colonoscopy. SW contacted Ship broker and they advised the best course of action was to keep patient in the psych ED as he is 10 days out from a bed at [facility] and were he admitted it would take much longer as it is not considered as acute a venue. There are also concerns for other patients on the floor as he is homicidal. This was apparently discussed with hospital management who has had made the decision to keep the patient in the ED. Please call back with questions.  Peggye Pitt, MD [facility] Pager: [phone]

## 2011-12-28 NOTE — BHH Counselor (Addendum)
Spoke with RN and [employer] Officer about concerns of pt's mother not responding to phone calls from staff or pt. RN contacted [employer] to have them complete a welfare check on her, to be sure of her well being, and request her to contact hospital. Spoke with pt to give him an updated. Pt was tearful, stating his mother was mean to him and "slammed the door when I tried to come home the other day".  Followed up with [facility] to confirm they received latest requested lab work. Spoke with Junious Dresser who stated they do not have a medical unit and the blood in his stool would need to be addressed before they could get him on the wait list. Will have to check with EDP to see what are the treatment plans.

## 2011-12-28 NOTE — Progress Notes (Signed)
[facility] ED CM received a call from Dr Ardyth Harps about the request for admission and concerns ([name])CM noted pt bed request not present in EPIC while CM was speaking with Dr Ardyth Harps Dr Hilliard Clark informed Pt reported to be refusing treatment for low hgb CM witnessed pt informed K Black, [facility] hospital[facility] NP, that he did "not want blood" ACT team member assisting with disposition [facility] CM discussed this with Michail Sermon RN

## 2011-12-28 NOTE — ED Notes (Signed)
Patient is resting comfortably. 

## 2011-12-28 NOTE — ED Notes (Signed)
Pt continues to refuse IV insertion, Security called to bedside to assist with insertion.

## 2011-12-28 NOTE — Progress Notes (Signed)
CSW attempted to assess pt csw needs, however pt asleep. CSW will follow up with patient tonight. Pt RN aware to contact CSW if patient awakes and wants to talk with CSW. CSW will also have daytime csw follow up with patient.   .Clinical social worker continuing to follow pt to assist with pt dc plans and further csw needs.   Catha Gosselin, LCSWA  [phone] 12/28/2011 9:16pm

## 2011-12-29 ENCOUNTER — Telehealth (HOSPITAL_COMMUNITY): Payer: Self-pay | Admitting: Licensed Clinical Social Worker

## 2011-12-29 DIAGNOSIS — F4310 Post-traumatic stress disorder, unspecified: Secondary | ICD-10-CM

## 2011-12-29 NOTE — ED Notes (Signed)
Social worker at bedside, contact with mother was made. Mother refusing to take patient back home.

## 2011-12-29 NOTE — Consult Note (Signed)
Reason for Consult: Posttraumatic stress disorder and schizophrenia, needs of psychiatric services and the capacity to make medical decisions. Referring Physician: Dr. Johnn Hai Holte is an 51 y.o. male.  HPI: Patient was seen and chart reviewed. Patient was admitted to the [facility] long emergency department with the involuntary commitment petition from his biological parents for alleged suicidal and homicidal ideations, which patient denied again and again during this emergency department stay. Patient lives at home with his elderly mother and father. Patient mother alleges that he threatened to kill her and also alleges that he threatened to commit suicide as well. He does admit to arguing with his mother but says he never said that he was having thoughts of harming her or himself. Patient . Patient stated that his mother has spent to $3500 for nothing and his arguing about it. Patient also highly focused on his music books and CDs and want them to go with him wherever he goes. Patient reported his mother repeatedly calling the police and also sending him to the hospital with the involuntary commitment petition is. Patient stated he cannot walk since July 2013 because of swollen legs. Patient  was exposed to a lot of trauma during [place] war 1992, and last multiple family members and has been reexperiencing the trauma and has been diagnosed with posttraumatic stress disorder andf schizo affective disorder. Patient reports compliance with his medications as prescribed .  Patient denied symptoms of depression, anxiety, psychosis, hallucinations, delusions, suicidal, homicidal ideations, intentions and plans. Patient has intact cognitive functions including orientation, concentration, memory and fund of knowledge. He has a significant medical history including hepatitis C and cirrhosis.   Past Medical History  Diagnosis Date  . Kidney disease   . GERD (gastroesophageal reflux disease)   . Liver disease     . Hepatitis C   . Ascites   . Anemia   . PTSD (post-traumatic stress disorder)   . Personality disorder   . History of ARDS   . Hx of Clostridium difficile infection   . Schizoaffective disorder   . PTSD (post-traumatic stress disorder)     SECONDARY TO WAR IN [place]  . Overdose 12/2009  . Peripheral neuropathy   . Aspiration pneumonia     HX OF  . Pleural effusion, bilateral     HX OF  . History of blood transfusion   . Hypothyroidism   . History of adrenal insufficiency   . H/O diabetes insipidus     Past Surgical History  Procedure Date  . Right hip surgery 1996  . US guided left thoracentesis 01/21/2010    History reviewed. No pertinent family history.  Social History:  reports that he has been smoking Cigarettes.  He has a 25 pack-year smoking history. He has never used smokeless tobacco. He reports that he does not drink alcohol or use illicit drugs.  Allergies: No Known Allergies  Medications: I have reviewed the patient's current medications.  Results for orders placed during the hospital encounter of 12/24/11 (from the past 48 hour(s))  PROTIME-INR     Status: Abnormal   Collection Time   12/28/11  7:05 AM      Component Value Range Comment   Prothrombin Time 15.3 (*) 11.6 - 15.2 seconds    INR 1.23  0.00 - 1.49   CBC     Status: Abnormal   Collection Time   12/28/11  3:30 PM      Component Value Range Comment   WBC 3.1 (*)  4.0 - 10.5 K/uL    RBC 3.18 (*) 4.22 - 5.81 MIL/uL    Hemoglobin 9.3 (*) 13.0 - 17.0 g/dL    HCT 34.7 (*) 42.5 - 52.0 %    MCV 89.9  78.0 - 100.0 fL    MCH 29.2  26.0 - 34.0 pg    MCHC 32.5  30.0 - 36.0 g/dL    RDW 95.6 (*) 38.7 - 15.5 %    Platelets 165  150 - 400 K/uL   BASIC METABOLIC PANEL     Status: Abnormal   Collection Time   12/28/11  3:30 PM      Component Value Range Comment   Sodium 133 (*) 135 - 145 mEq/L    Potassium 4.9  3.5 - 5.1 mEq/L    Chloride 101  96 - 112 mEq/L    CO2 26  19 - 32 mEq/L    Glucose, Bld  102 (*) 70 - 99 mg/dL    BUN 26 (*) 6 - 23 mg/dL    Creatinine, Ser 5.64 (*) 0.50 - 1.35 mg/dL    Calcium 9.6  8.4 - 33.2 mg/dL    GFR calc non Af Amer 35 (*) >90 mL/min    GFR calc Af Amer 41 (*) >90 mL/min     No results found.  Positive for mood swings, sleep disturbance and tobacco use Blood pressure 110/66, pulse 84, temperature 98.3 F (36.8 C), temperature source Oral, resp. rate 15, SpO2 95.00%.   Assessment/Plan: Posttraumatic stress disorder. Schizoaffective disorder, by history  Patient has been compliant with his medication and has no current psychiatric needs. Patient does not meet criteria for acute psychiatric hospitalization. Patient meets capacity to make his own medical decisions and he has understanding about his medical needs and willing to receive medical care as recommended by medical doctors and also willing to speak with the social worker regarding his the living arrangements and the asking for physical therapist so that he can be mobile on his legs.  Shylah Dossantos,JANARDHAHA R. 12/29/2011, 5:03 PM

## 2011-12-29 NOTE — ED Notes (Signed)
Pt using telephone to call mother.

## 2011-12-29 NOTE — Progress Notes (Signed)
[facility] ED CM reviewed EPIC notes to find that [facility] prior to d/c home with mother and home health[facility], was d/c from [facility] ED to home on 11/09/11 with home health services, [facility] 2013, d/c with home hospice from [facility] ED 11/13/10, has been in  [facility], [facility] to [facility], [facility]

## 2011-12-29 NOTE — BH Assessment (Signed)
Today's shift report and report from previous shift indicate that patient is pending placement at [facility]. Although patient is pending [facility] he is not on their wait list due to medical concerns. Writer contacted [facility] today to follow up and spoke to [facility] nurse) and Wellsite geologist. Connie sts, "Patient's has several unstable labs. He is not medically clear at this time. He had blood in stool. We are concerned that he will need a blood transfusion and we don't do that here. We are not a medical facility and not capable of handling a patient this medically acute".   Writer shared the above information with EDP-Dr. Freida Busman. The plan from this conversation consist of psychiatrist --Dr. Elsie Saas evaluating patient to determine capacity. Writer shared the above information with psychiatrist today.   Writer had another discussion with Director Eunice Blase) and EDP (Dr. Freida Busman) regarding patient's plan for treatment. Dr. Elsie Saas was also included in this plan/discussion. Patient's disposition at this time includes a competency  evaluation to determine if patient has the competency  to make appropriate decisison and if psychiatric treatment is needed.   Disposition: Pt pending evaluation by psychiatrist.

## 2011-12-29 NOTE — Progress Notes (Signed)
CSW spoke with patient mother once again, to plan speaking with the interpreter at home on the phone in the next 5 min. Pt stated, " my interpreter lives in [place] and is at home, tomorrow." CSW asked pt mother if csw could call back in 5 min, hoping for pt mother to answer the phone again. CSW called with interpreter line, however pt mother placed phone off the hook.   RN case manager found pt documents appointing pt mother as pt legal guardian. CSW left message with APS stating that patient legal guardian is refusing for patient to return home now cleared medically and psychiatrically. Pt legal guardian/mother is also refusing to assist with finding a safe place for pt.   CSW initiated snf search including surrounding counties. Pt currently has a level C pasarr that was effect 05/2011.  CSW will continue to assist with pt dc plans and follow up with APS regarding pt legal guardianship. Pt EDP aware of pt current placement status.   .Clinical social worker continuing to follow pt to assist with pt dc plans and further csw needs.   Catha Gosselin, LCSWA  [phone] .12/29/2011 1133pm

## 2011-12-29 NOTE — Progress Notes (Signed)
[facility] ED CM reviewed EPIC notes and labs. Noted hgb increase to 9.3 from 8.7.  [facility] ED Director inquired of progress of disposition.  CM reviewed ED SW noted from 12/28/11 pm.  Noted EDP, Allen note from 12/29/11.  CM spoke with ACT team member, Jessie Foot via telephone (ED director near [facility]) who confirms information sent to [facility] 12/[facility]/11. Refer to ACT member note.  EDP, Freida Busman, discussed wanting to get pt to acute unit and states will consult with [facility].  ED director requesting to speak with CM/SW leadership, SW, ACT, ED psychologist further.

## 2011-12-29 NOTE — Progress Notes (Signed)
[facility] ED CM updated assistant director, Dennie Bible and Medical Director, Dr Jacky Kindle.  Disposition plan: 1) if no suicidal and/or homicidal ideations, d/c pt home with legal guardian, mother or to facility of pt choice 2) if suicidal and homicidal ideations, there needs to be a MD peer to peer interaction to get pt on [facility]

## 2011-12-29 NOTE — ED Notes (Signed)
MD at bedside. 

## 2011-12-29 NOTE — ED Provider Notes (Signed)
Pt awaiting [facility]. Social work involved and  attempting to place with a community act team   Toy Baker, MD 12/29/11 [phone]

## 2011-12-29 NOTE — ED Notes (Signed)
Pt requesting to wait until all medication is here from [facility]. Wants to take all medication at the same time.

## 2011-12-29 NOTE — ED Notes (Signed)
Pt alert and oriented x4. Respirations even and unlabored, bilateral symmetrical rise and fall of chest. Skin warm and dry. In no acute distress. Denies needs.   

## 2011-12-29 NOTE — Progress Notes (Signed)
CSW met with pt at bedside. Pt is stating that if he can't go home he can go to the street. CSW called pt mother, who stated, "No home. I speak little english, I do not understand." CSW told patient mother that she will call back with an interpreter. CSW called interpreter line, csw and interpreter called patient mother who did not answer phone call. Call was placed 4 times.   Per chart review, pt legal guardian is patient mother. CSW will need to call state department to confirm pt status in regards to legal guardian. CSW also initiating placement.    .Clinical social worker continuing to follow pt to assist with pt dc plans and further csw needs.   Catha Gosselin, Theresia Majors  [phone] .12/29/2011 1851

## 2011-12-29 NOTE — Progress Notes (Signed)
Patient sleeping when went by. Consulted with staff. Will refer to on call chaplain for follow up.  12/29/11 1500  Clinical Encounter Type  Visited With Health care provider  Visit Type Spiritual support;Social support  Referral From Nurse  Recommendations Follow up

## 2011-12-30 ENCOUNTER — Encounter (HOSPITAL_COMMUNITY): Payer: Self-pay | Admitting: Internal Medicine

## 2011-12-30 DIAGNOSIS — F259 Schizoaffective disorder, unspecified: Secondary | ICD-10-CM

## 2011-12-30 DIAGNOSIS — R188 Other ascites: Secondary | ICD-10-CM

## 2011-12-30 DIAGNOSIS — R45850 Homicidal ideations: Secondary | ICD-10-CM

## 2011-12-30 DIAGNOSIS — Z862 Personal history of diseases of the blood and blood-forming organs and certain disorders involving the immune mechanism: Secondary | ICD-10-CM

## 2011-12-30 LAB — CBC
MCV: 89.6 fL (ref 78.0–100.0)
Platelets: 151 10*3/uL (ref 150–400)
RBC: 3.07 MIL/uL — ABNORMAL LOW (ref 4.22–5.81)
WBC: 3.1 10*3/uL — ABNORMAL LOW (ref 4.0–10.5)

## 2011-12-30 LAB — CREATININE, SERUM
Creatinine, Ser: 2.06 mg/dL — ABNORMAL HIGH (ref 0.50–1.35)
GFR calc Af Amer: 41 mL/min — ABNORMAL LOW (ref >90)
GFR calc non Af Amer: 36 mL/min — ABNORMAL LOW (ref >90)

## 2011-12-30 MED ORDER — SODIUM CHLORIDE 0.9 % IJ SOLN: 3.0000 mL | INTRAMUSCULAR | Status: DC | PRN

## 2011-12-30 MED ORDER — HEPARIN SODIUM (PORCINE) 5000 UNIT/ML IJ SOLN
5000.0000 [IU] | Freq: Three times a day (TID) | INTRAMUSCULAR | Status: DC
  Filled 2011-12-30 (×7): qty 1

## 2011-12-30 MED ORDER — FUROSEMIDE 20 MG PO TABS
20.0000 mg | ORAL_TABLET | Freq: Two times a day (BID) | ORAL | Status: DC
  Administered 2011-12-30 (×2): 20 mg via ORAL
  Filled 2011-12-30 (×4): qty 1

## 2011-12-30 MED ORDER — B COMPLEX-C PO TABS
1.0000 | ORAL_TABLET | Freq: Every day | ORAL | Status: DC
  Administered 2011-12-30 – 2011-12-31 (×2): 1 via ORAL
  Filled 2011-12-30 (×2): qty 1

## 2011-12-30 MED ORDER — FERROUS SULFATE 325 (65 FE) MG PO TABS
325.0000 mg | ORAL_TABLET | Freq: Every day | ORAL | Status: DC
  Administered 2011-12-30 – 2011-12-31 (×2): 325 mg via ORAL
  Filled 2011-12-30 (×4): qty 1

## 2011-12-30 MED ORDER — SODIUM CHLORIDE 0.9 % IJ SOLN: 3.0000 mL | Freq: Two times a day (BID) | INTRAMUSCULAR | Status: DC

## 2011-12-30 MED ORDER — SODIUM CHLORIDE 0.9 % IV SOLN: 250.0000 mL | INTRAVENOUS | Status: DC | PRN

## 2011-12-30 NOTE — H&P (Signed)
[facility] History and Physical  [name] WUJ:[id number] DOB: March 02, 1961    PCP:   Dorrene German, MD   Chief Complaint: None.  I was asked to admit this patient by Dr Fonnie Jarvis as he has no place to go after boarding in the ER for several days.  HPI: [name] is an 51 y.o. male with hx of Hep C, PTSD, Ascites, anemia, Hx of Diabetic Insipidus, Hx of hypothyroidism and adrenal insufficiency, admitted to the ER with involuntary commitment by his mother, claiming that he was suicidal and was threatening to hurt her.  He was boarded in the ER for several days.  Psych consulted finally cleared him, but yet his mother had refused to take him home.  Other places also hadn't agreed to take him because of his many medical problems, albeit stable.  Dr Fonnie Jarvis said that he had solicited help from the admnistrators and that they are all aware of the difficult disposition of this patient.  Since it has been over a few days, he had asked me to admit him to the hospital.  Rewiew of Systems:  Constitutional: Negative for malaise, fever and chills. No significant weight loss or weight gain Eyes: Negative for eye pain, redness and discharge, diplopia, visual changes, or flashes of light. ENMT: Negative for ear pain, hoarseness, nasal congestion, sinus pressure and sore throat. No headaches; tinnitus, drooling, or problem swallowing. Cardiovascular: Negative for chest pain, palpitations, diaphoresis, dyspnea and peripheral edema. ; No orthopnea, PND Respiratory: Negative for cough, hemoptysis, wheezing and stridor. No pleuritic chestpain. Gastrointestinal: Negative for nausea, vomiting, diarrhea, constipation, abdominal pain, melena, blood in stool, hematemesis, jaundice and rectal bleeding.    Genitourinary: Negative for frequency, dysuria, incontinence,flank pain and hematuria; Musculoskeletal: Negative for back pain and neck pain. Skin: . Negative for pruritus, rash, abrasions, bruising and skin  lesion.; ulcerations Neuro: Negative for headache, lightheadedness and neck stiffness. Negative for weakness, altered level of consciousness , altered mental status, extremity weakness, burning feet, involuntary movement, seizure and syncope.  Psych: negative for anxiety, depression, insomnia, tearfulness, panic attacks, hallucinations, paranoia, suicidal or homicidal ideation    Past Medical History  Diagnosis Date  . Kidney disease   . GERD (gastroesophageal reflux disease)   . Liver disease   . Hepatitis C   . Ascites   . Anemia   . PTSD (post-traumatic stress disorder)   . Personality disorder   . History of ARDS   . Hx of Clostridium difficile infection   . Schizoaffective disorder   . PTSD (post-traumatic stress disorder)     SECONDARY TO WAR IN [place]  . Overdose 12/2009  . Peripheral neuropathy   . Aspiration pneumonia     HX OF  . Pleural effusion, bilateral     HX OF  . History of blood transfusion   . Hypothyroidism   . History of adrenal insufficiency   . H/O diabetes insipidus     Past Surgical History  Procedure Date  . Right hip surgery 1996  . US guided left thoracentesis 01/21/2010    Medications:  HOME MEDS: Prior to Admission medications   Medication Sig Start Date End Date Taking? Authorizing Provider  b complex-vitamin c-folic acid (NEPHRO-VITE) 0.8 MG TABS Take 0.8 mg by mouth 4 (four) times daily.    Historical Provider, MD  ciprofloxacin (CIPRO) 250 MG tablet Take 250 mg by mouth every Wednesday. Take with 500mg  to make 750    Historical Provider, MD  ciprofloxacin (CIPRO) 500 MG tablet Take  500 mg by mouth every Wednesday. Take with 250mg  to make 750    Historical Provider, MD  divalproex (DEPAKOTE) 250 MG DR tablet Take 250 mg by mouth 2 (two) times daily.    Historical Provider, MD  ferrous sulfate 325 (65 FE) MG tablet Take 325 mg by mouth daily with breakfast.    Historical Provider, MD  furosemide (LASIX) 20 MG tablet Take 20 mg by mouth 2  (two) times daily.    Historical Provider, MD  lactulose (CHRONULAC) 10 GM/15ML solution Take 20 g by mouth 2 (two) times daily.    Historical Provider, MD  omeprazole (PRILOSEC) 20 MG capsule Take 20 mg by mouth 2 (two) times daily before a meal. Take with amoxicillin and clarithromycin    Historical Provider, MD  ondansetron (ZOFRAN) 4 MG tablet Take 4 mg by mouth daily at 6 PM.    Historical Provider, MD  QUEtiapine (SEROQUEL) 300 MG tablet Take 600 mg by mouth at bedtime.    Historical Provider, MD  ranitidine (ZANTAC) 150 MG tablet Take 150 mg by mouth daily.    Historical Provider, MD  rifaximin (XIFAXAN) 550 MG TABS Take 550 mg by mouth 2 (two) times daily.    Historical Provider, MD  spironolactone (ALDACTONE) 100 MG tablet Take 100 mg by mouth daily.    Historical Provider, MD  Vitamin B Complex-C CAPS Take 1 capsule by mouth daily.    Historical Provider, MD  ziprasidone (GEODON) 20 MG capsule Take 1 capsule (20 mg total) by mouth daily after breakfast. 11/08/11 02/06/12  Ward Givens, MD     Allergies:  No Known Allergies  Social History:   reports that he has been smoking Cigarettes.  He has a 25 pack-year smoking history. He has never used smokeless tobacco. He reports that he does not drink alcohol or use illicit drugs.  Family History: History reviewed. No pertinent family history.   Physical Exam: Filed Vitals:   12/29/11 0618 12/29/11 1144 12/29/11 1933 12/29/11 2336  BP: 93/64 110/66 99/69 92/63   Pulse: 89 84 79 85  Temp: 98.3 F (36.8 C)  98.5 F (36.9 C) 98.5 F (36.9 C)  TempSrc: Oral  Oral Oral  Resp: 18 15 18 18   SpO2: 92% 95% 97% 95%   Blood pressure 92/63, pulse 85, temperature 98.5 F (36.9 C), temperature source Oral, resp. rate 18, SpO2 95.00%.  GEN:  Pleasant  patient lying in the stretcher in no acute distress; cooperative with exam. PSYCH:  alert and oriented x4; does not appear anxious or depressed; affect is appropriate. HEENT: Mucous membranes  pink and anicteric; PERRLA; EOM intact; no cervical lymphadenopathy nor thyromegaly or carotid bruit; no JVD; There were no stridor. Neck is very supple. Breasts:: Not examined CHEST WALL: No tenderness CHEST: Normal respiration, clear to auscultation bilaterally.  HEART: Regular rate and rhythm.  There are no murmur, rub, or gallops.   BACK: No kyphosis or scoliosis; no CVA tenderness ABDOMEN: soft and non-tender; no masses, no organomegaly, normal abdominal bowel sounds; no pannus; no intertriginous candida. There is no rebound and no distention.  He has a large abdomen. Rectal Exam: Not done EXTREMITIES: No bone or joint deformity; age-appropriate arthropathy of the hands and knees; 2+ edema, no ulcerations.  There is no calf tenderness. Genitalia: not examined PULSES: 2+ and symmetric SKIN: Normal hydration no rash or ulceration CNS: Cranial nerves 2-12 grossly intact no focal lateralizing neurologic deficit.  Speech is fluent; uvula elevated with phonation, facial symmetry and  tongue midline. DTR are normal bilaterally, cerebella exam is intact, barbinski is negative and strengths are equaled bilaterally.  No sensory loss.   Labs on Admission:  Basic Metabolic Panel:  Lab 12/28/11 4098 12/26/11 1500 12/24/11 1003 12/23/11 1630  NA 133* 137 133* 132*  K 4.9 4.9 4.7 4.8  CL 101 103 100 98  CO2 26 25 24 24   GLUCOSE 102* 87 100* 93  BUN 26* 27* 28* 25*  CREATININE 2.09* 2.23* 2.35* 2.20*  CALCIUM 9.6 9.5 9.3 9.4  MG -- -- -- --  PHOS -- -- -- --   Liver Function Tests:  Lab 12/26/11 1500 12/23/11 1630  AST 20 20  ALT 10 8  ALKPHOS 65 75  BILITOT 0.3 0.3  PROT 6.0 6.4  ALBUMIN 2.3* 2.5*    Lab 12/23/11 1630  LIPASE 43  AMYLASE --    Lab 12/23/11 1630  AMMONIA 57   CBC:  Lab 12/28/11 1530 12/27/11 1100 12/26/11 1500 12/24/11 1003 12/23/11 1630  WBC 3.1* 3.2* 2.9* 3.9* 3.7*  NEUTROABS -- -- -- 1.9 --  HGB 9.3* 8.7* 8.9* 9.0* 10.1*  HCT 28.6* 26.5* 27.4* 27.3* 31.4*   MCV 89.9 89.2 90.4 88.6 89.2  PLT 165 154 147* 137* 137*   Cardiac Enzymes: No results found for this basename: CKTOTAL:5,CKMB:5,CKMBINDEX:5,TROPONINI:5 in the last 168 hours  CBG: No results found for this basename: GLUCAP:5 in the last 168 hours   Radiological Exams on Admission: No results found.   Assessment/Plan Present on Admission:  .Psychosis .Personality disorder .Anemia .Ascites .Liver disease .Hepatitis C .Kidney disease .GERD (gastroesophageal reflux disease) .Schizoaffective disorder .Hypothyroidism .PTSD (post-traumatic stress disorder) .History of adrenal insufficiency .H/O diabetes insipidus  PLAN:  Will admit him to the hospital as he shouldn't board in the ER for this long.  He does have many medical problems, but they are all stable.  I noted he has a Cr of 2, but this is stable since 11/2010.  Social service and the administrators are aware of this situation as per Dr Fonnie Jarvis.  Medically, he is stable to be discharge as well.  I believe that social worker is planning to work on getting him another legal guardian by the state court, since his parents aren't exactly supportive of him.  I have reconsulted them, though they are aware of his admission.  He is stable, full code, and will be admitted to [facility].  Other plans as per orders.  Code Status: FULL Unk Lightning, MD. [facility] Pager [phone] 7pm to 7am.  12/30/2011, 1:04 AM

## 2011-12-30 NOTE — Evaluation (Signed)
Physical Therapy Evaluation Patient Details Name: [name] MRN: [id number] DOB: December 11, 1960 Today's Date: 12/30/2011 Time: 7829-5621 PT Time Calculation (min): 20 min  PT Assessment / Plan / Recommendation Clinical Impression  51 yo male with acites,  generalized weakness and muscle atrophy preventing functional mobility.  He will benefit from continued PT for strengthening and progression to functional mobility and gait training.    PT Assessment  Patient needs continued PT services    Follow Up Recommendations  Post acute inpatient rehab    Barriers to Discharge Decreased caregiver support      Equipment Recommendations  Rolling walker with 5" wheels;3 in 1 bedside comode    Recommendations for Other Services OT consult   Frequency Min 3X/week    Precautions / Restrictions     Pertinent Vitals/Pain Pt c/o abdominal pain in sitting from heaviness of abdomen      Mobility  Bed Mobility Bed Mobility: Rolling Right;Supine to Sit;Sit to Sidelying Right Rolling Right: 3: Mod assist Supine to Sit: 1: +2 Total assist;HOB elevated Supine to Sit: Patient Percentage: 50% Sit to Sidelying Right: 1: +2 Total assist (assit to bring legs up onto bed) Sit to Sidelying Right: Patient Percentage: 20% Details for Bed Mobility Assistance: pt prefers to lie on right side.  He has difficulty moving abdomen to lie on his back and he is unable to roll over to left side  Transfers Transfers: Sit to Stand;Stand to Sit Sit to Stand: 1: +2 Total assist;From bed Sit to Stand: Patient Percentage: 20% Stand to Sit: 1: +2 Total assist Stand to Sit: Patient Percentage: 20% Transfer via Lift Equipment: Stedy Details for Transfer Assistance: attempted 3 times to stand with RW and even pulling up on STEDY.  Pt was unable to generate enough  LE and back strength to lift himself up into standing even with +2 assist. heavy weight of abdomen pulls him forward Ambulation/Gait Ambulation/Gait Assistance:  Not tested (comment) Stairs: No    Shoulder Instructions     Exercises     PT Diagnosis: Difficulty walking;Generalized weakness  PT Problem List: Decreased strength;Decreased activity tolerance;Decreased mobility;Decreased safety awareness PT Treatment Interventions: DME instruction;Functional mobility training;Therapeutic activities;Therapeutic exercise   PT Goals Acute Rehab PT Goals PT Goal Formulation: With patient Time For Goal Achievement: 01/13/12 Potential to Achieve Goals: Fair Pt will Roll Supine to Right Side: with min assist PT Goal: Rolling Supine to Right Side - Progress: Goal set today Pt will Roll Supine to Left Side: with min assist PT Goal: Rolling Supine to Left Side - Progress: Goal set today Pt will go Supine/Side to Sit: with min assist PT Goal: Supine/Side to Sit - Progress: Goal set today Pt will go Sit to Supine/Side: with min assist PT Goal: Sit to Supine/Side - Progress: Goal set today Pt will go Sit to Stand: with mod assist PT Goal: Sit to Stand - Progress: Goal set today Pt will go Stand to Sit: with mod assist PT Goal: Stand to Sit - Progress: Goal set today Pt will Transfer Bed to Chair/Chair to Bed: with mod assist PT Transfer Goal: Bed to Chair/Chair to Bed - Progress: Goal set today Pt will Ambulate: 1 - 15 feet;with mod assist PT Goal: Ambulate - Progress: Goal set today  Visit Information  Last PT Received On: 12/30/11 Assistance Needed: +2    Subjective Data      Prior Functioning  Prior Function Comments: difficult to assess previous funtional status except that pt states he has not been  walkng Communication Communication: No difficulties    Cognition  Overall Cognitive Status: Difficult to assess Arousal/Alertness: Awake/alert Behavior During Session: Other (comment) (when pt has difficulty with task, he repeats,"I can not") Cognition - Other Comments: pt is able to attempt to follow one step commands    Extremity/Trunk  Assessment Right Lower Extremity Assessment RLE ROM/Strength/Tone: Deficits RLE ROM/Strength/Tone Deficits: Muscle wasting evident. some edema evident in lower leg .  Pt is able to initate muscle contraction but is too deconditioned to lift leg against gravity RLE Coordination: Deficits RLE Coordination Deficits: pt uanble to to move legs against gravity Left Lower Extremity Assessment LLE ROM/Strength/Tone: Deficits LLE ROM/Strength/Tone Deficits: Muscle wasting evident.  Edema evident in lower leg, greater than right lower leg  Pt is able to initate muscle contraction but is too deconditioned to lift leg against gravity LLE Coordination: Deficits LLE Coordination Deficits: unable to move leg to assess coordination Trunk Assessment Trunk Assessment: Other exceptions Trunk Exceptions: pt with muscle wasting in back and trunk.  Pt with large, heavy, anterior abdomen that he has difficulty controlling during movement   Balance Balance Balance Assessed: Yes Static Sitting Balance Static Sitting - Balance Support: Left upper extremity supported;Feet supported Static Sitting - Level of Assistance: 6: Modified independent (Device/Increase time) Static Sitting - Comment/# of Minutes: 5  End of Session PT - End of Session Activity Tolerance: Other (comment) (limited by general muscle weakness) Patient left: in bed;with call bell/phone within reach Nurse Communication: Mobility status  GP Functional Assessment Tool Used: clinical judgement Functional Limitation: Changing and maintaining body position Changing and Maintaining Body Position Current Status (N8295): At least 80 percent but less than 100 percent impaired, limited or restricted Changing and Maintaining Body Position Goal Status (A2130): At least 40 percent but less than 60 percent impaired, limited or restricted   Rosey Bath K. [place], [place] [phone] 12/30/2011, 2:41 PM

## 2011-12-30 NOTE — Progress Notes (Signed)
CSW spoke with [facility] of [place], [place] who extended bed offer to patient. Facility is emailing admission paper work to this CSW to assist with paper work with pt mother/legal guardian.   CSW spoke with pt mother/legal guardian with interpreter to set up meeting to complete facility paper work at the hospital. Pt mother/legal guardian is arranging transportation to bring pt mother to hospital this evening. Pt mother/legal guardian agreed to call csw cell phone when patient mother/legal guardian is on her way to the hospital. Pt mother/legal guardian agreed to meet CSW outside patient room this evening.   Pt is anticipated to discharge tomorrow to [facility] [place], [place].   Catha Gosselin, LCSWA  [phone] .12/30/2011 1612pm

## 2011-12-30 NOTE — Progress Notes (Signed)
Patient BP 84/46, no c/o lightheadedness,alert,patient refused IV insertion, explained th importance of IVF, stated I drink fluids, "this has been my BP 30 years ago, attending MD notified,will continue to monitor patient, will endorse to night nurse not to give Lasix or spironolactone per MD.

## 2011-12-30 NOTE — Progress Notes (Signed)
[facility] PROGRESS NOTE  [name] WUJ:[id number] DOB: [birth year] DOA: 12/24/2011 PCP: Dorrene German, MD  Assessment/Plan: 53. 51 year old gentle man with multiple medical problems admitted for placement.  2. Hypotension: pt is currently asymptomatic. Will order a bolus of normal saline and repeat blood pressure.  3. Dispo; PENDING PT/OT eval.      Antibiotics:  cipro for prophylaxis  HPI/Subjective: i just want to talk to social worker an dget out of here  Objective: Filed Vitals:   12/30/11 0215 12/30/11 0250 12/30/11 0617 12/30/11 1320  BP: 81/57 105/63 88/54 82/50   Pulse: 91 87 80 82  Temp: 97.5 F (36.4 C) 98.4 F (36.9 C) 97.9 F (36.6 C) 98.2 F (36.8 C)  TempSrc: Oral Oral Oral   Resp: 18 18 18 18   Height:  6\' 6"  (1.981 m)    Weight:  127.1 kg (280 lb 3.3 oz)    SpO2: 92% 94% 95% 93%    Intake/Output Summary (Last 24 hours) at 12/30/11 1739 Last data filed at 12/30/11 1700  Gross per 24 hour  Intake    720 ml  Output   1700 ml  Net   -980 ml   Filed Weights   12/30/11 0250  Weight: 127.1 kg (280 lb 3.3 oz)    Exam: GEN: Pleasant patient lying in the stretcher in no acute distress; cooperative with exam. CHEST: Normal respiration, clear to auscultation bilaterally.  HEART: Regular rate and rhythm. There are no murmur, rub, or gallops. ABDOMEN: soft and non-tender; no masses, no organomegaly, normal abdominal bowel sounds; EXTREMITIES: 2+ edema, no ulcerations. There is no calf tenderness.     Data Reviewed: Basic Metabolic Panel:  Lab 12/30/11 9562 12/28/11 1530 12/26/11 1500 12/24/11 1003  NA -- 133* 137 133*  K -- 4.9 4.9 4.7  CL -- 101 103 100  CO2 -- 26 25 24   GLUCOSE -- 102* 87 100*  BUN -- 26* 27* 28*  CREATININE 2.06* 2.09* 2.23* 2.35*  CALCIUM -- 9.6 9.5 9.3  MG -- -- -- --  PHOS -- -- -- --   Liver Function Tests:  Lab 12/26/11 1500  AST 20  ALT 10  ALKPHOS 65  BILITOT 0.3  PROT 6.0  ALBUMIN 2.3*   No results  found for this basename: LIPASE:5,AMYLASE:5 in the last 168 hours No results found for this basename: AMMONIA:5 in the last 168 hours CBC:  Lab 12/30/11 0323 12/28/11 1530 12/27/11 1100 12/26/11 1500 12/24/11 1003  WBC 3.1* 3.1* 3.2* 2.9* 3.9*  NEUTROABS -- -- -- -- 1.9  HGB 9.0* 9.3* 8.7* 8.9* 9.0*  HCT 27.5* 28.6* 26.5* 27.4* 27.3*  MCV 89.6 89.9 89.2 90.4 88.6  PLT 151 165 154 147* 137*   Cardiac Enzymes: No results found for this basename: CKTOTAL:5,CKMB:5,CKMBINDEX:5,TROPONINI:5 in the last 168 hours BNP (last 3 results) No results found for this basename: PROBNP:3 in the last 8760 hours CBG: No results found for this basename: GLUCAP:5 in the last 168 hours  No results found for this or any previous visit (from the past 240 hour(s)).   Studies: No results found.  Scheduled Meds:   . B-complex with vitamin C  1 tablet Oral Daily  . ciprofloxacin  250 mg Oral Q Wed  . ciprofloxacin  500 mg Oral Q Wed  . divalproex  250 mg Oral BID  . ferrous sulfate  325 mg Oral Q breakfast  . furosemide  20 mg Oral BID  . heparin  5,000 Units Subcutaneous Q8H  .  lactulose  20 g Oral BID  . ondansetron  4 mg Oral q1800  . pantoprazole  40 mg Oral Q1200  . QUEtiapine  600 mg Oral QHS  . rifaximin  550 mg Oral BID  . sodium chloride  3 mL Intravenous Q12H  . spironolactone  100 mg Oral Daily  . ziprasidone  20 mg Oral QPC breakfast   Continuous Infusions:   Principal Problem:  *Hepatitis C Active Problems:  Psychosis  Homicidal ideation  Personality disorder  Anemia  Ascites  Liver disease  Kidney disease  GERD (gastroesophageal reflux disease)  Schizoaffective disorder  PTSD (post-traumatic stress disorder)  Hypothyroidism  History of adrenal insufficiency  H/O diabetes insipidus        [name]  [facility] Pager [phone]. If 8PM-8AM, please contact night-coverage at [email or web address], password [facility] 12/30/2011, 5:39 PM  LOS: 6 days

## 2011-12-30 NOTE — Progress Notes (Addendum)
CSW completed paperwork with pt mother/legal guardian for admission to [facility] with assitance from interpreter line and pt family member. Pt mother/guardian thanked csw for concern and support. Pt mother/legal guardian brought pt clothes and pt favorite food, oranges to patient. Pt guardian gave pt medications to dispose of since nursing facility can not use them. CSW took medications to pharmacy for disposal.  Pt mother/legal guardian told pt where he will be going tomorrow. Pt and mother/legal guardian had a pleasant good bye. Pt thanked csw for help and for bringing pt mother to pt. Pt is motivated to go to a skilled nursing facility. Pt stated, "my mother is old and senile, I cant go home right now." Pt thanked csw once again.   CSW and patient talked about the facility and the area that pt will be traveling to. Pt stated that [place] liked the mountains and was looking forward to seeing it snow.   CSW faxed signature pages for admission to facility per number given...[phone].    Catha Gosselin, Theresia Majors  [phone] .12/30/2011 6578

## 2011-12-30 NOTE — ED Notes (Signed)
Patient refused IV. 

## 2011-12-30 NOTE — Progress Notes (Signed)
Clinical Social Work Department BRIEF PSYCHOSOCIAL ASSESSMENT 12/30/2011  Patient:  [name], [name]     Account Number:  [id number]     Admit date:  12/24/2011  Clinical Social Worker:  Skip Mayer  Date/Time:  12/30/2011 01:00 PM  Referred by:  Physician  Date Referred:  12/30/2011 Referred for  SNF Placement   Other Referral:   Interview type:  Other - See comment Other interview type:   Interview with pt at bedside and pt's mother via phone call with interpreter.    PSYCHOSOCIAL DATA Living Status:  FAMILY Admitted from facility:   Level of care:   Primary support name:  Safija Primary support relationship to patient:  Mother/Legal Guardian Degree of support available:   Pt's mother/legal guardian willing to make decisions on pt's behalf, but not will for to d/c to her home.    CURRENT CONCERNS Current Concerns  Post-Acute Placement   Other Concerns:    SOCIAL WORK ASSESSMENT / PLAN CSW met with pt at bedside and spoke with pt's mother via phone call with [employer].  This pt has been discussed with Clinical Social Work Optometrist.  As of this morning, pt remains medically and psychiatrically stable.  Psych MD note reports pt does not meet for inpatient psychiatric hospitalization and has capacity to make decisions; however, pt has been deemed legally incompetent by courts and his mother is the legal guardian.  Pt's mother/guardian refuses to have pt d/c to her home as she reports she is fearful for her life and her husband's life.  Pt's mother reports pt continues to issue verbal threats to kill her and himself-pt denied any suicical or homicidal ideation during visit.  Pt's mother also reports pt with increasing difficulty with ADLs.  Awaiting PT eval to determine if SNF is appropriate.  State-wide search initiated as pt will be difficult to place due to psychiatric diagnoses.  CSW will continue to follow for appropriate dispo.    Assessment/plan status:  Information/Referral to [facility] Other assessment/ plan:    Information/referral to community resources:   SNF ALF [facility]    PATIENT'S/FAMILY'S RESPONSE TO PLAN OF CARE: Pt verbalized understanding that his mother is legal guardian and is making pt's decisions. Pt also reports agreeable to placement. Pt pleasant and engaged during visit.  Pt's mother is agreeable to sign pt in at any facility able to manage his needs.  Pt's mother verbalized understanding of current d/c plan.         Dellie Burns, MSW, [place] [phone] (coverage)

## 2011-12-30 NOTE — Progress Notes (Signed)
Attempted IV start, patient refused. Pt stated, "maybe in the morning after I get some sleep." Will attempted again in the morning. Angelena Form, RN

## 2011-12-30 NOTE — Progress Notes (Addendum)
Clinical Social Work Department CLINICAL SOCIAL WORK PLACEMENT NOTE 12/30/2011  Patient:  [name], [name]  Account Number:  [id number] Admit date:  12/24/2011  Clinical Social Worker:  Skip Mayer  Date/time:  12/30/2011 02:00 PM  Clinical Social Work is seeking post-discharge placement for this patient at the following level of care:   SKILLED NURSING   (*CSW will update this form in Epic as items are completed)   12/30/2011  Patient/family provided with [facility] Department of Clinical Social Work's list of facilities offering this level of care within the geographic area requested by the patient (or if unable, by the patient's family).  12/30/2011  Patient/family informed of their freedom to choose among providers that offer the needed level of care, that participate in Medicare, Medicaid or managed care program needed by the patient, have an available bed and are willing to accept the patient.  12/30/2011  Patient/family informed of [facility]' ownership interest in [facility], as well as of the fact that they are under no obligation to receive care at this facility.  PASARR submitted to EDS on  PASARR number received from EDS on   FL2 transmitted to all facilities in geographic area requested by pt/family on  12/30/2011 FL2 transmitted to all facilities within larger geographic area on 12/30/2011  Patient informed that his/her managed care company has contracts with or will negotiate with  certain facilities, including the following:     Patient/family informed of bed offers received:  12/30/2011 Patient chooses bed at [facility] Physician recommends and patient chooses bed at    Patient to be transferred to  on  12/30/2011 (JB) Patient to be transferred to facility by 12/30/2011 Dorma Russell)  The following physician request were entered in Epic:   Additional Comments: Pt with exisitng level C PASARR # issued March 2013.

## 2011-12-30 NOTE — ED Provider Notes (Signed)
Pt cleared by Psych, then Pt seen at bedside by myself with psychiatrist and Pt denies threat to harm self or others, denies haluccinations, is calm and cooperative, states bedridden and unable to walk or care for himself, Pt's mother reportedly again refused to take Pt home, [facility] had refused to consider Pt due to medical acuity with risk of further GI bleeding, CM and SW have been involved as well as ACT, Pt remains anemic yet stable, SW now involving APS to make get state custody of Pt and find SNF placement, since Pt no longer IVC or ED Psych hold and has been in ED for days, with Pt disposition still pending and unknown remaining time course to obtain disposition by SW, d/w [facility] again who will see in ED to place on Obs.  Hurman Horn, MD 12/30/11 [phone]

## 2011-12-31 DIAGNOSIS — K219 Gastro-esophageal reflux disease without esophagitis: Secondary | ICD-10-CM

## 2011-12-31 DIAGNOSIS — D649 Anemia, unspecified: Secondary | ICD-10-CM

## 2011-12-31 DIAGNOSIS — B1920 Unspecified viral hepatitis C without hepatic coma: Secondary | ICD-10-CM

## 2011-12-31 NOTE — Progress Notes (Signed)
The patient states that he never tried to kill his mother, and that he never had high blood sugar.

## 2011-12-31 NOTE — Progress Notes (Signed)
Patient still refusing IV. MD aware. Patient had a good night. Rested well. Up to chair once with 2 person assist and sat up for approximately 30 minutes before returning to bed. Angelena Form, RN

## 2011-12-31 NOTE — Progress Notes (Signed)
Pt to transfer to [facility] today via [facility]. Pt, pt's mother, and SNF aware of d/c. D/C packet complete with chart copy, admission ppw, and signed FL2. CSW signing off as no other CSW needs identified.  Dellie Burns, MSW, LCSWA [phone] (coverage)

## 2011-12-31 NOTE — Discharge Summary (Signed)
Physician Discharge Summary  [name] MVH:[id number] DOB: 1960-11-18 DOA: 12/24/2011  PCP: Dorrene German, MD  Admit date: 12/24/2011 Discharge date: 12/31/2011  Recommendations for Outpatient Follow-up:  1. Follow up with PCP as recommended.   Discharge Diagnoses:  Principal Problem:  *Hepatitis C Active Problems:  Psychosis  Homicidal ideation  Personality disorder  Anemia  Ascites  Liver disease  Kidney disease  GERD (gastroesophageal reflux disease)  Schizoaffective disorder  PTSD (post-traumatic stress disorder)  Hypothyroidism  History of adrenal insufficiency  H/O diabetes insipidus   Discharge Condition: stable  Diet recommendation: low salt diet  Filed Weights   12/30/11 0250  Weight: 127.1 kg (280 lb 3.3 oz)    History of present illness:  [name] is an 51 y.o. male with hx of Hep C, PTSD, Ascites, anemia, Hx of Diabetic Insipidus, Hx of hypothyroidism and adrenal insufficiency, admitted to the ER with involuntary commitment by his mother, claiming that he was suicidal and was threatening to hurt her. He was boarded in the ER for several days. Psych consulted finally cleared him, but yet his mother had refused to take him home. Other places also hadn't agreed to take him because of his many medical problems, albeit stable. Dr Fonnie Jarvis said that he had solicited help from the admnistrators and that they are all aware of the difficult disposition of this patient. Since it has been over a few days, he had asked the hospitalist service  to admit him to the hospital.   Hospital Course:  .Psychosis; stable .Personality disorder; no suicidal or homicidal ideations .Anemia: at baseline .Ascites: stable .Liver disease: continue with his home medications of lasix and spironolactone .Hepatitis C: outpatient follow up .GERD (gastroesophageal reflux disease): stable .Schizoaffective disorder: no homicida ideations. Continue with geodon .Hypothyroidism .PTSD  (post-traumatic stress disorder) .Hypotension: pt is asyptomatic. His bp runs low at times. As per the patient, that's his normal blood pressure. Currently his bP IS 93/50 and he is asymptomatic.    Consultations:  psychiatry  Discharge Exam: Filed Vitals:   12/30/11 0617 12/30/11 1320 12/30/11 1748 12/30/11 2116  BP: 88/54 82/50 84/46  92/53  Pulse: 80 82 96 88  Temp: 97.9 F (36.6 C) 98.2 F (36.8 C)  99.4 F (37.4 C)  TempSrc: Oral     Resp: 18 18  18   Height:      Weight:      SpO2: 95% 93%  94%   GEN: Pleasant patient lying in the bed in no acute distress; cooperative with exam.  CHEST: Normal respiration, clear to auscultation bilaterally.  HEART: Regular rate and rhythm. There are no murmur, rub, or gallops.  ABDOMEN: soft and non-tender; no masses, no organomegaly, normal abdominal bowel sounds;  EXTREMITIES: 2+ edema, no ulcerations. There is no calf tenderness.   Discharge Instructions  Discharge Orders    Future Orders Please Complete By Expires   Diet - low sodium heart healthy      Discharge instructions      Comments:   Follow up with pcp as needed   Activity as tolerated - No restrictions          Medication List     As of 12/31/2011  8:04 AM    TAKE these medications         b complex-vitamin c-folic acid 0.8 MG Tabs   Take 0.8 mg by mouth 4 (four) times daily.      ciprofloxacin 250 MG tablet   Commonly known as: CIPRO   Take  250 mg by mouth every Wednesday. Take with 500mg  to make 750      ciprofloxacin 500 MG tablet   Commonly known as: CIPRO   Take 500 mg by mouth every Wednesday. Take with 250mg  to make 750      divalproex 250 MG DR tablet   Commonly known as: DEPAKOTE   Take 250 mg by mouth 2 (two) times daily.      ferrous sulfate 325 (65 FE) MG tablet   Take 325 mg by mouth daily with breakfast.      furosemide 20 MG tablet   Commonly known as: LASIX   Take 20 mg by mouth 2 (two) times daily.      lactulose 10 GM/15ML solution    Commonly known as: CHRONULAC   Take 20 g by mouth 2 (two) times daily.      omeprazole 20 MG capsule   Commonly known as: PRILOSEC   Take 20 mg by mouth 2 (two) times daily before a meal. Take with amoxicillin and clarithromycin      ondansetron 4 MG tablet   Commonly known as: ZOFRAN   Take 4 mg by mouth daily at 6 PM.      QUEtiapine 300 MG tablet   Commonly known as: SEROQUEL   Take 600 mg by mouth at bedtime.      ranitidine 150 MG tablet   Commonly known as: ZANTAC   Take 150 mg by mouth daily.      rifaximin 550 MG Tabs   Commonly known as: XIFAXAN   Take 550 mg by mouth 2 (two) times daily.      spironolactone 100 MG tablet   Commonly known as: ALDACTONE   Take 100 mg by mouth daily.      Vitamin B Complex-C Caps   Take 1 capsule by mouth daily.      ziprasidone 20 MG capsule   Commonly known as: GEODON   Take 1 capsule (20 mg total) by mouth daily after breakfast.           Follow-up Information    Follow up with Dorrene German, MD. On 01/13/2012. (please call if unable to go to appointment )    Contact information:   [address] [place] [address] [phone]           The results of significant diagnostics from this hospitalization (including imaging, microbiology, ancillary and laboratory) are listed below for reference.    Significant Diagnostic Studies: No results found.  Microbiology: No results found for this or any previous visit (from the past 240 hour(s)).   Labs: Basic Metabolic Panel:  Lab 12/30/11 9147 12/28/11 1530 12/26/11 1500 12/24/11 1003  NA -- 133* 137 133*  K -- 4.9 4.9 4.7  CL -- 101 103 100  CO2 -- 26 25 24   GLUCOSE -- 102* 87 100*  BUN -- 26* 27* 28*  CREATININE 2.06* 2.09* 2.23* 2.35*  CALCIUM -- 9.6 9.5 9.3  MG -- -- -- --  PHOS -- -- -- --   Liver Function Tests:  Lab 12/26/11 1500  AST 20  ALT 10  ALKPHOS 65  BILITOT 0.3  PROT 6.0  ALBUMIN 2.3*   No results found for this basename:  LIPASE:5,AMYLASE:5 in the last 168 hours No results found for this basename: AMMONIA:5 in the last 168 hours CBC:  Lab 12/30/11 0323 12/28/11 1530 12/27/11 1100 12/26/11 1500 12/24/11 1003  WBC 3.1* 3.1* 3.2* 2.9* 3.9*  NEUTROABS -- -- -- --  1.9  HGB 9.0* 9.3* 8.7* 8.9* 9.0*  HCT 27.5* 28.6* 26.5* 27.4* 27.3*  MCV 89.6 89.9 89.2 90.4 88.6  PLT 151 165 154 147* 137*   Cardiac Enzymes: No results found for this basename: CKTOTAL:5,CKMB:5,CKMBINDEX:5,TROPONINI:5 in the last 168 hours BNP: BNP (last 3 results) No results found for this basename: PROBNP:3 in the last 8760 hours CBG: No results found for this basename: GLUCAP:5 in the last 168 hours    Signed:  Burman Bruington  [facility] 12/31/2011, 8:04 AM

## 2013-01-17 ENCOUNTER — Encounter (HOSPITAL_COMMUNITY): Payer: Self-pay | Admitting: Emergency Medicine

## 2013-01-17 ENCOUNTER — Emergency Department (HOSPITAL_COMMUNITY)
Admission: EM | Admit: 2013-01-17 | Discharge: 2013-01-23 | Disposition: A | Discharge status: Other Acute Inpatient Hospital | Payer: Medicaid Other | Attending: Emergency Medicine | Admitting: Emergency Medicine

## 2013-01-17 DIAGNOSIS — F17200 Nicotine dependence, unspecified, uncomplicated: Secondary | ICD-10-CM | POA: Insufficient documentation

## 2013-01-17 DIAGNOSIS — D649 Anemia, unspecified: Secondary | ICD-10-CM | POA: Insufficient documentation

## 2013-01-17 DIAGNOSIS — Z79899 Other long term (current) drug therapy: Secondary | ICD-10-CM | POA: Insufficient documentation

## 2013-01-17 DIAGNOSIS — R45850 Homicidal ideations: Secondary | ICD-10-CM

## 2013-01-17 DIAGNOSIS — F4310 Post-traumatic stress disorder, unspecified: Secondary | ICD-10-CM | POA: Insufficient documentation

## 2013-01-17 DIAGNOSIS — F918 Other conduct disorders: Secondary | ICD-10-CM | POA: Insufficient documentation

## 2013-01-17 DIAGNOSIS — Z8709 Personal history of other diseases of the respiratory system: Secondary | ICD-10-CM | POA: Insufficient documentation

## 2013-01-17 DIAGNOSIS — F609 Personality disorder, unspecified: Secondary | ICD-10-CM

## 2013-01-17 DIAGNOSIS — F29 Unspecified psychosis not due to a substance or known physiological condition: Secondary | ICD-10-CM | POA: Diagnosis present

## 2013-01-17 DIAGNOSIS — K219 Gastro-esophageal reflux disease without esophagitis: Secondary | ICD-10-CM | POA: Insufficient documentation

## 2013-01-17 DIAGNOSIS — Z87448 Personal history of other diseases of urinary system: Secondary | ICD-10-CM | POA: Insufficient documentation

## 2013-01-17 DIAGNOSIS — Z862 Personal history of diseases of the blood and blood-forming organs and certain disorders involving the immune mechanism: Secondary | ICD-10-CM | POA: Insufficient documentation

## 2013-01-17 DIAGNOSIS — F259 Schizoaffective disorder, unspecified: Secondary | ICD-10-CM | POA: Insufficient documentation

## 2013-01-17 DIAGNOSIS — Z8639 Personal history of other endocrine, nutritional and metabolic disease: Secondary | ICD-10-CM | POA: Insufficient documentation

## 2013-01-17 DIAGNOSIS — Z8619 Personal history of other infectious and parasitic diseases: Secondary | ICD-10-CM | POA: Insufficient documentation

## 2013-01-17 DIAGNOSIS — R4689 Other symptoms and signs involving appearance and behavior: Secondary | ICD-10-CM

## 2013-01-17 DIAGNOSIS — Z8669 Personal history of other diseases of the nervous system and sense organs: Secondary | ICD-10-CM | POA: Insufficient documentation

## 2013-01-17 LAB — CBC WITH DIFFERENTIAL/PLATELET
Basophils Relative: 0 % (ref 0–1)
Eosinophils Absolute: 0.1 10*3/uL (ref 0.0–0.7)
HCT: 33.5 % — ABNORMAL LOW (ref 39.0–52.0)
Hemoglobin: 11.4 g/dL — ABNORMAL LOW (ref 13.0–17.0)
MCH: 31.3 pg (ref 26.0–34.0)
MCHC: 34 g/dL (ref 30.0–36.0)
Monocytes Absolute: 0.8 10*3/uL (ref 0.1–1.0)
Monocytes Relative: 10 % (ref 3–12)

## 2013-01-17 LAB — COMPREHENSIVE METABOLIC PANEL
Albumin: 3.2 g/dL — ABNORMAL LOW (ref 3.5–5.2)
BUN: 37 mg/dL — ABNORMAL HIGH (ref 6–23)
Creatinine, Ser: 2.07 mg/dL — ABNORMAL HIGH (ref 0.50–1.35)
Total Protein: 7.5 g/dL (ref 6.0–8.3)

## 2013-01-17 LAB — SALICYLATE LEVEL: Salicylate Lvl: <2 mg/dL — ABNORMAL LOW (ref 2.8–20.0)

## 2013-01-17 LAB — ACETAMINOPHEN LEVEL: Acetaminophen (Tylenol), Serum: <15 ug/mL (ref 10–30)

## 2013-01-17 MED ORDER — NICOTINE 21 MG/24HR TD PT24
21.0000 mg | MEDICATED_PATCH | Freq: Every day | TRANSDERMAL | Status: DC
  Filled 2013-01-17 (×2): qty 1

## 2013-01-17 MED ORDER — ONDANSETRON HCL 4 MG PO TABS: 4.0000 mg | ORAL_TABLET | Freq: Three times a day (TID) | ORAL | Status: DC | PRN

## 2013-01-17 MED ORDER — DIPHENHYDRAMINE HCL 50 MG/ML IJ SOLN
50.0000 mg | Freq: Once | INTRAMUSCULAR | Status: AC
  Administered 2013-01-17: 50 mg via INTRAMUSCULAR
  Filled 2013-01-17: qty 1

## 2013-01-17 MED ORDER — ZIPRASIDONE HCL 20 MG PO CAPS
20.0000 mg | ORAL_CAPSULE | Freq: Every day | ORAL | Status: DC
  Administered 2013-01-18 – 2013-01-23 (×6): 20 mg via ORAL
  Filled 2013-01-17 (×6): qty 1

## 2013-01-17 MED ORDER — IBUPROFEN 200 MG PO TABS
600.0000 mg | ORAL_TABLET | Freq: Three times a day (TID) | ORAL | Status: DC | PRN
  Administered 2013-01-20 – 2013-01-23 (×4): 600 mg via ORAL
  Filled 2013-01-17 (×4): qty 3

## 2013-01-17 MED ORDER — LORAZEPAM 2 MG/ML IJ SOLN
2.0000 mg | Freq: Once | INTRAMUSCULAR | Status: AC
  Administered 2013-01-17: 2 mg via INTRAMUSCULAR
  Filled 2013-01-17 (×2): qty 1

## 2013-01-17 MED ORDER — DIVALPROEX SODIUM 250 MG PO DR TAB
250.0000 mg | DELAYED_RELEASE_TABLET | Freq: Two times a day (BID) | ORAL | Status: DC
  Administered 2013-01-17 – 2013-01-23 (×11): 250 mg via ORAL
  Filled 2013-01-17 (×12): qty 1

## 2013-01-17 MED ORDER — QUETIAPINE FUMARATE 300 MG PO TABS
600.0000 mg | ORAL_TABLET | Freq: Every day | ORAL | Status: DC
  Administered 2013-01-17 – 2013-01-22 (×5): 600 mg via ORAL
  Filled 2013-01-17 (×6): qty 2

## 2013-01-17 MED ORDER — ALUM & MAG HYDROXIDE-SIMETH 200-200-20 MG/5ML PO SUSP: 30.0000 mL | ORAL | Status: DC | PRN

## 2013-01-17 MED ORDER — ZOLPIDEM TARTRATE 5 MG PO TABS
5.0000 mg | ORAL_TABLET | Freq: Every evening | ORAL | Status: DC | PRN
  Administered 2013-01-19 – 2013-01-20 (×2): 5 mg via ORAL
  Filled 2013-01-17 (×2): qty 1

## 2013-01-17 MED ORDER — LORAZEPAM 1 MG PO TABS
1.0000 mg | ORAL_TABLET | Freq: Three times a day (TID) | ORAL | Status: DC | PRN
  Administered 2013-01-17 – 2013-01-19 (×5): 1 mg via ORAL
  Filled 2013-01-17 (×5): qty 1

## 2013-01-17 MED ORDER — HALOPERIDOL LACTATE 5 MG/ML IJ SOLN
5.0000 mg | Freq: Once | INTRAMUSCULAR | Status: AC
  Administered 2013-01-17: 5 mg via INTRAMUSCULAR
  Filled 2013-01-17: qty 1

## 2013-01-17 NOTE — ED Notes (Addendum)
Pt smells.  Pt. Refuse to let anyone change him or check him.

## 2013-01-17 NOTE — ED Provider Notes (Signed)
Care assumed from [place], [place] at shift change. Patient brought in under IVC for medical clearance, uncooperative, agitated. Psych labs pending. Receiving haldol, ativan and benadryl, has four-point restraints in order to draw labs. Plan to move to psych once medically cleared. TTS consult. 4:45 PM Patient evaluated by TTS, awaiting placement. 6:42 PM Medically cleared to move to psych ED.  Trevor Mace, PA-C 01/18/13 1902

## 2013-01-17 NOTE — ED Notes (Signed)
Patient refused vitals.

## 2013-01-17 NOTE — Consult Note (Signed)
[facility] Face-to-Face Psychiatry Consult   Reason for Consult:  Evaluation for inpatient treatment Referring Physician:  EDP  Spence [name] is an 52 y.o. male.  Assessment: AXIS I:  Schizoaffective Disorder AXIS II:  Deferred AXIS III:   Past Medical History  Diagnosis Date  . Kidney disease   . GERD (gastroesophageal reflux disease)   . Liver disease   . Hepatitis C   . Ascites   . Anemia   . PTSD (post-traumatic stress disorder)   . Personality disorder   . History of ARDS   . Hx of Clostridium difficile infection   . Schizoaffective disorder   . PTSD (post-traumatic stress disorder)     SECONDARY TO WAR IN [place]  . Overdose 12/2009  . Peripheral neuropathy   . Aspiration pneumonia     HX OF  . Pleural effusion, bilateral     HX OF  . History of blood transfusion   . History of adrenal insufficiency   . H/O diabetes insipidus   . Hypothyroidism    AXIS IV:  other psychosocial or environmental problems AXIS V:  21-30 behavior considerably influenced by delusions or hallucinations OR serious impairment in judgment, communication OR inability to function in almost all areas  Plan:  Futher observation needed  Subjective:   [name] is a 52 y.o. male.  HPI:  Patient is going back and forth in his native language when talking.  When redirected to speak in [place] he would do so. Patient was able to tell his name, date of birth and month of year.  Patient denies suicidal ideation.  It was unclear about psychosis; patient stated "No I don't hear voices but when I talk to some one the words never leave the room."  Patient kept asking to go home stating he wanted to see his mother for the last time.  Past Psychiatric History: Past Medical History  Diagnosis Date  . Kidney disease   . GERD (gastroesophageal reflux disease)   . Liver disease   . Hepatitis C   . Ascites   . Anemia   . PTSD (post-traumatic stress disorder)   . Personality disorder   . History of ARDS   . Hx  of Clostridium difficile infection   . Schizoaffective disorder   . PTSD (post-traumatic stress disorder)     SECONDARY TO WAR IN [place]  . Overdose 12/2009  . Peripheral neuropathy   . Aspiration pneumonia     HX OF  . Pleural effusion, bilateral     HX OF  . History of blood transfusion   . History of adrenal insufficiency   . H/O diabetes insipidus   . Hypothyroidism     reports that he has been smoking Cigarettes.  He has a 25 pack-year smoking history. He has never used smokeless tobacco. He reports that he does not drink alcohol or use illicit drugs. History reviewed. No pertinent family history.       Abuse/Neglect [facility]) Physical Abuse: Denies Verbal Abuse: Denies Sexual Abuse: Denies Allergies:  No Known Allergies  ACT Assessment Complete:  Yes:    Educational Status    Risk to Self: Risk to self Is patient at risk for suicide?: No  Risk to Others:    Abuse: Abuse/Neglect Assessment (Assessment to be complete while patient is alone) Physical Abuse: Denies Verbal Abuse: Denies Sexual Abuse: Denies Exploitation of patient/patient's resources: Denies Self-Neglect: Yes, present (Comment) (defacating on self, won't let family clean, not taking medication)  Prior Inpatient Therapy:  Prior Outpatient Therapy:    Additional Information:                    Objective: Blood pressure 104/80, pulse 88, resp. rate 14, SpO2 99.00%.There is no weight on file to calculate BMI. Results for orders placed during the hospital encounter of 01/17/13 (from the past 72 hour(s))  CBC WITH DIFFERENTIAL     Status: Abnormal   Collection Time    01/17/13  3:46 PM      Result Value Range   WBC 7.6  4.0 - 10.5 K/uL   RBC 3.64 (*) 4.22 - 5.81 MIL/uL   Hemoglobin 11.4 (*) 13.0 - 17.0 g/dL   HCT 16.1 (*) 09.6 - 04.5 %   MCV 92.0  78.0 - 100.0 fL   MCH 31.3  26.0 - 34.0 pg   MCHC 34.0  30.0 - 36.0 g/dL   RDW 40.9 (*) 81.1 - 91.4 %   Platelets 180  150 - 400 K/uL    Neutrophils Relative % 68  43 - 77 %   Neutro Abs 5.2  1.7 - 7.7 K/uL   Lymphocytes Relative 20  12 - 46 %   Lymphs Abs 1.6  0.7 - 4.0 K/uL   Monocytes Relative 10  3 - 12 %   Monocytes Absolute 0.8  0.1 - 1.0 K/uL   Eosinophils Relative 1  0 - 5 %   Eosinophils Absolute 0.1  0.0 - 0.7 K/uL   Basophils Relative 0  0 - 1 %   Basophils Absolute 0.0  0.0 - 0.1 K/uL  COMPREHENSIVE METABOLIC PANEL     Status: Abnormal   Collection Time    01/17/13  3:46 PM      Result Value Range   Sodium 132 (*) 135 - 145 mEq/L   Potassium 4.6  3.5 - 5.1 mEq/L   Chloride 103  96 - 112 mEq/L   CO2 19  19 - 32 mEq/L   Glucose, Bld 88  70 - 99 mg/dL   BUN 37 (*) 6 - 23 mg/dL   Creatinine, Ser 7.82 (*) 0.50 - 1.35 mg/dL   Calcium 95.6  8.4 - 21.3 mg/dL   Total Protein 7.5  6.0 - 8.3 g/dL   Albumin 3.2 (*) 3.5 - 5.2 g/dL   AST 37  0 - 37 U/L   ALT 20  0 - 53 U/L   Alkaline Phosphatase 91  39 - 117 U/L   Total Bilirubin 0.5  0.3 - 1.2 mg/dL   GFR calc non Af Amer 35 (*) >90 mL/min   GFR calc Af Amer 41 (*) >90 mL/min   Comment: (NOTE)     The eGFR has been calculated using the CKD EPI equation.     This calculation has not been validated in all clinical situations.     eGFR's persistently <90 mL/min signify possible Chronic Kidney     Disease.  SALICYLATE LEVEL     Status: Abnormal   Collection Time    01/17/13  3:46 PM      Result Value Range   Salicylate Lvl <2.0 (*) 2.8 - 20.0 mg/dL  ACETAMINOPHEN LEVEL     Status: None   Collection Time    01/17/13  3:46 PM      Result Value Range   Acetaminophen (Tylenol), Serum <15.0  10 - 30 ug/mL   Comment:            THERAPEUTIC CONCENTRATIONS VARY  SIGNIFICANTLY. A RANGE OF 10-30     ug/mL MAY BE AN EFFECTIVE     CONCENTRATION FOR MANY PATIENTS.     HOWEVER, SOME ARE BEST TREATED     AT CONCENTRATIONS OUTSIDE THIS     RANGE.     ACETAMINOPHEN CONCENTRATIONS     >150 ug/mL AT 4 HOURS AFTER     INGESTION AND >50 ug/mL AT 12     HOURS AFTER  INGESTION ARE     OFTEN ASSOCIATED WITH TOXIC     REACTIONS.  ETHANOL     Status: None   Collection Time    01/17/13  3:46 PM      Result Value Range   Alcohol, Ethyl (B) <11  0 - 11 mg/dL   Comment:            LOWEST DETECTABLE LIMIT FOR     SERUM ALCOHOL IS 11 mg/dL     FOR MEDICAL PURPOSES ONLY     Current Facility-Administered Medications  Medication Dose Route Frequency Provider Last Rate Last Dose  . alum & mag hydroxide-simeth (MAALOX/MYLANTA) 200-200-20 MG/5ML suspension 30 mL  30 mL Oral PRN Trevor Mace, PA-C      . ibuprofen (ADVIL,MOTRIN) tablet 600 mg  600 mg Oral Q8H PRN Trevor Mace, PA-C      . LORazepam (ATIVAN) tablet 1 mg  1 mg Oral Q8H PRN Trevor Mace, PA-C      . nicotine (NICODERM CQ - dosed in mg/24 hours) patch 21 mg  21 mg Transdermal Daily Robyn M Albert, PA-C      . ondansetron [facility]) tablet 4 mg  4 mg Oral Q8H PRN Trevor Mace, PA-C      . zolpidem (AMBIEN) tablet 5 mg  5 mg Oral QHS PRN Trevor Mace, PA-C       Current Outpatient Prescriptions  Medication Sig Dispense Refill  . b complex-vitamin c-folic acid (NEPHRO-VITE) 0.8 MG TABS Take 0.8 mg by mouth 4 (four) times daily.      . ciprofloxacin (CIPRO) 250 MG tablet Take 250 mg by mouth every Wednesday. Take with 500mg  to make 750      . ciprofloxacin (CIPRO) 500 MG tablet Take 500 mg by mouth every Wednesday. Take with 250mg  to make 750      . divalproex (DEPAKOTE) 250 MG DR tablet Take 250 mg by mouth 2 (two) times daily.      . ferrous sulfate 325 (65 FE) MG tablet Take 325 mg by mouth daily with breakfast.      . furosemide (LASIX) 20 MG tablet Take 20 mg by mouth 2 (two) times daily.      Marland Kitchen lactulose (CHRONULAC) 10 GM/15ML solution Take 20 g by mouth 2 (two) times daily.      Marland Kitchen omeprazole (PRILOSEC) 20 MG capsule Take 20 mg by mouth 2 (two) times daily before a meal. Take with amoxicillin and clarithromycin      . ondansetron (ZOFRAN) 4 MG tablet Take 4 mg by mouth daily at 6 PM.       . QUEtiapine (SEROQUEL) 300 MG tablet Take 600 mg by mouth at bedtime.      . ranitidine (ZANTAC) 150 MG tablet Take 150 mg by mouth daily.      . rifaximin (XIFAXAN) 550 MG TABS Take 550 mg by mouth 2 (two) times daily.      Marland Kitchen spironolactone (ALDACTONE) 100 MG tablet Take 100 mg by mouth daily.      Marland Kitchen  Vitamin B Complex-C CAPS Take 1 capsule by mouth daily.      . ziprasidone (GEODON) 20 MG capsule Take 1 capsule (20 mg total) by mouth daily after breakfast.  30 capsule  0    Psychiatric Specialty Exam:     Blood pressure 104/80, pulse 88, resp. rate 14, SpO2 99.00%.There is no weight on file to calculate BMI.  General Appearance: Disheveled  Eye Contact::  Good  Speech:  Clear and Coherent, Normal Rate and Some spoken in English and some in his native language   Volume:  Normal  Mood:  Anxious  Affect:  Constricted  Thought Process:  Disorganized  Orientation:  Other:  Person and Time unaware of situation and place    Thought Content:  Possible hallucinations unable to determine at this time    Suicidal Thoughts:  No  Patient denies  Homicidal Thoughts:  No  Patient denies  Memory:  Immediate;   Poor Recent;   Poor Remote;   Poor  Judgement:  Impaired  Insight:  Unable to determine  Psychomotor Activity:  Normal  Concentration:  Poor  Recall:  Poor  Akathisia:  No  Handed:  Right  AIMS (if indicated):     Assets:  Social Support  Sleep:      Face to face interview and consult with Dr. Lolly Mustache  Treatment Plan Summary: . Unable to understand patient enough to determine disposition.  Will start patient's medication and reassess in the morning with interpreter if needed.    Assunta Found, FNP-BC 01/17/2013 4:47 PM I agreed with the findings, treatment and disposition plan of this patient. Kathryne Sharper, MD

## 2013-01-17 NOTE — ED Notes (Signed)
Patient is not able to perform his own ADL's and can not go to psych ED due to this.

## 2013-01-17 NOTE — ED Notes (Addendum)
Pt appears agitated and yelling staff.  Ativan PRN given

## 2013-01-17 NOTE — ED Notes (Addendum)
PER EMS- pt picked up from home with c/o IVC'd due refusal to take medications and pt defecating on himself and refusing allow family to clean it up.  Pt agitated and refusing care.  IVC paperwork reports pt is being aggressive and throwing things at his mom.  Pt has an extensive psych hx.
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
## 2013-01-18 MED ORDER — DIPHENHYDRAMINE HCL 50 MG/ML IJ SOLN
25.0000 mg | Freq: Once | INTRAMUSCULAR | Status: AC
  Administered 2013-01-18: 25 mg via INTRAMUSCULAR
  Filled 2013-01-18: qty 1

## 2013-01-18 MED ORDER — LORAZEPAM 2 MG/ML IJ SOLN
2.0000 mg | Freq: Once | INTRAMUSCULAR | Status: AC
  Administered 2013-01-18: 2 mg via INTRAMUSCULAR
  Filled 2013-01-18: qty 1

## 2013-01-18 MED ORDER — HALOPERIDOL LACTATE 5 MG/ML IJ SOLN
5.0000 mg | Freq: Once | INTRAMUSCULAR | Status: AC
  Administered 2013-01-18: 5 mg via INTRAMUSCULAR
  Filled 2013-01-18: qty 1

## 2013-01-18 NOTE — ED Notes (Signed)
Patient with superficial scratching to right leg--minimal bleeding noted Attempted to clean area and apply band-aid--patient refusing Charge nurse aware
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
## 2013-01-18 NOTE — Progress Notes (Signed)
CSW spoke with [name] at [facility] where patient was a resident from 12/31/2011 to 10/02/2012. Per discussion with [name], patient was a resident at the facility and was discharged home per request of patient and patient parents. Per discussion with facility, patient was coopeartive for the most part, however had times where he would refuse medications, would go to the hosptial for short stays to get back on medications and stabilize and then would return to the skilled nursing facility. Per discussion with [name], they are willing to consider patient returning once stabilized on medications. Per [name] he will also be calling his colleagues and other locations that may be closer to home, as an advocate for patient. These facilties including, [place], [place], [place], [place], [place], [place], [place], [place], [place], [place], [place], [place], [place], [place].   Catha Gosselin, LCSW [phone]  ED CSW
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
## 2013-01-18 NOTE — ED Notes (Addendum)
Gave pt sandwich and cup of water

## 2013-01-18 NOTE — Progress Notes (Signed)
Pt needing assistance with ADLS and therefor [facility] is only hosptial available and able to meet patient needs.  Patient continued to be followed by psych to stablize medications, and considering snf placement. Per discussion with [facility] patient may be able to return once stabilized, and also talking with sister locations to help keep patient close to home to family. Patient mother/guardian open to patient going to snf again.   Catha Gosselin, [place] [phone]  ED CSW 01/18/2013 956am

## 2013-01-18 NOTE — ED Notes (Signed)
Patient becoming more agitated and labile Scheduled medications due Patient would only take PRN Ativan despite encouragement from nursing and security staff Patient refusing to have armband scanned Will make charge nurse aware and chart against MAR if/when patient allows

## 2013-01-18 NOTE — ED Notes (Signed)
Assumed care of patient Introduced self to patient Patient speaking in both [place] and [place] Patient making sexually inappropriate comments---patient redirected Patient denies c/o pain Sitter at bedside

## 2013-01-18 NOTE — ED Notes (Signed)
Patient medicated for increased agitation, see MAR Patient assisted back to bed Sitter remains at bedside

## 2013-01-18 NOTE — ED Notes (Signed)
Unable to get pt urine pt is incontinent and is wearing a pamper.

## 2013-01-18 NOTE — Progress Notes (Addendum)
CSW faxed referral to [facility] and completed verbal referral with Cassandra who confirmed receipt of fax.  Patient is pending review at this time.  Marva Panda, Theresia Majors  [phone]  .01/18/2013  5:05 pm  Call from [name] at [facility] requesting a Depakote level and urinalysis for pt.   CSW notified EDP Freida Busman of labs needed.  Marva Panda, LCSWA  [phone]  01/18/2013 5:25 pm

## 2013-01-18 NOTE — ED Notes (Signed)
Pt ate all of his sandwich and drank his water

## 2013-01-18 NOTE — ED Provider Notes (Signed)
Medical screening examination/treatment/procedure(s) were performed by non-physician practitioner and as supervising physician I was immediately available for consultation/collaboration.  EKG Interpretation   None         Enid Skeens, MD 01/18/13 1511

## 2013-01-19 ENCOUNTER — Encounter (HOSPITAL_COMMUNITY): Payer: Self-pay | Admitting: Psychiatry

## 2013-01-19 DIAGNOSIS — F609 Personality disorder, unspecified: Secondary | ICD-10-CM

## 2013-01-19 DIAGNOSIS — F4310 Post-traumatic stress disorder, unspecified: Secondary | ICD-10-CM

## 2013-01-19 DIAGNOSIS — Z789 Other specified health status: Secondary | ICD-10-CM

## 2013-01-19 DIAGNOSIS — F29 Unspecified psychosis not due to a substance or known physiological condition: Secondary | ICD-10-CM

## 2013-01-19 LAB — RAPID URINE DRUG SCREEN, HOSP PERFORMED
Barbiturates: NOT DETECTED
Cocaine: NOT DETECTED
Opiates: NOT DETECTED

## 2013-01-19 LAB — URINALYSIS, ROUTINE W REFLEX MICROSCOPIC
Bilirubin Urine: NEGATIVE
Glucose, UA: NEGATIVE mg/dL
Hgb urine dipstick: NEGATIVE
Ketones, ur: NEGATIVE mg/dL
Leukocytes, UA: NEGATIVE
Nitrite: NEGATIVE
Specific Gravity, Urine: 1.021 (ref 1.005–1.030)
pH: 5 (ref 5.0–8.0)

## 2013-01-19 MED ORDER — LORAZEPAM 2 MG/ML IJ SOLN: 2.0000 mg | Freq: Four times a day (QID) | INTRAMUSCULAR | Status: DC | PRN

## 2013-01-19 MED ORDER — BACITRACIN 500 UNIT/GM EX OINT
1.0000 "application " | TOPICAL_OINTMENT | Freq: Two times a day (BID) | CUTANEOUS | Status: DC
  Administered 2013-01-21: 1 via TOPICAL
  Filled 2013-01-19 (×9): qty 0.9

## 2013-01-19 MED ORDER — LORAZEPAM 1 MG PO TABS
1.0000 mg | ORAL_TABLET | Freq: Four times a day (QID) | ORAL | Status: DC | PRN
  Administered 2013-01-19 – 2013-01-22 (×6): 1 mg via ORAL
  Filled 2013-01-19 (×6): qty 1

## 2013-01-19 NOTE — Progress Notes (Signed)
Pt sitting in chair in the hallway after he pushed himself into the hallway. CSW provided supportive counseling. Patient agreed to scoot his chair back in his room which was his space. Pt stated he would sit in his space. Pt stated he would scott back his chair if he got a coca cola. RN gave pt coca cola. Pt thanked staff for help getting a coca cola.   Catha Gosselin, LCSW [phone]  ED CSW .01/17/2013 1007am

## 2013-01-19 NOTE — ED Notes (Signed)
Emptied 400cc amber urine from urinal.  Offered BR and declined.

## 2013-01-19 NOTE — Progress Notes (Addendum)
[facility] referral sent 01/18/2013, [facility] requested UDS, UA, and Depakote levels. CSW faxed these additional clinicals on 01/19/2013. Per Sharma Covert at [facility], clinicals have been received and pending nurse evaluation. CSW awaiting return call from [facility].   Catha Gosselin, LCSW [phone]  ED CSW .01/19/2013 8:09am   Per [name], patient on [facility] waitlist.   .Catha Gosselin, LCSW [phone]  ED CSW .01/19/2013 822am

## 2013-01-19 NOTE — ED Notes (Signed)
Patient resting with eyes closed RR WNL--even and unlabored with equal rise and fall of chest Patient in NAD Sitter remains at bedside

## 2013-01-19 NOTE — ED Notes (Signed)
Spoke to EDP Ward about the reddened open sore area on pt's bottom, the reddened areas in between the creases of his legs and his buttocks and trying to prevent any ulcers or bed sores. She reports the only thing to prevent is to keep him clean and moving in different positions but will order bacitracin for small open area although pt probably won't let writer apply. Writer will try to get pt to apply himself.

## 2013-01-19 NOTE — ED Notes (Signed)
Per Tim from [facility] pt doesn't need TTS at this time and is pending [facility].

## 2013-01-19 NOTE — ED Notes (Signed)
Patient walked from bed to the TV trying to find the Nascar chanel then he turned and walked back to the bed when given the controller to the TV.

## 2013-01-19 NOTE — Progress Notes (Signed)
CSW called to follow up on referral to [facility].  Left voicemail for [name] [phone]) to return call.    Marva Panda, Theresia Majors  [phone]  .01/19/2013

## 2013-01-19 NOTE — ED Notes (Signed)
Pt defecated on himself, took diaper off and threw it on the floor of room. Pt fought RN's including Clinical research associate when trying to clean him up. He threw coca cola on this RN and refused to give either RN cup of cola. RN's had to get security for assistance. Pt balled up fist and swung at RN's with security present. Pt had lots of dried defecation on his bottom, he has a small reddened area that looks like a small sore and it's very red in between in buttocks and in the creases of his legs as well though he denies having any pain. He had blanket wrapped around his neck and refused to give to either RN. Pt appears to have dementia and is probably sundowning. Pt is not appropriate for this unit as he is not mobile and cannot perform ADL's. This Clinical research associate spoke to [facility] at [facility] about this and the fact that day shift had a tech all day with less than five patients but our tech was put on call. Will call EDP about reddened areas and try to get cream ordered to prevent ulcers or bed sores and will call [facility] to let him know the potential risk and symptoms pt already has as well.

## 2013-01-19 NOTE — Progress Notes (Signed)
Per discussion with pt mother, and from previous admissions, patient is not consistent with ADLs and wears a diaper. CSW discussed with EDP. Per EDP patient to be transferred to Psych ED. RN calling [facility] AC to discuss.   Catha Gosselin, LCSW [phone]  ED CSW 01/19/2013 823am

## 2013-01-19 NOTE — ED Notes (Signed)
Received report from ED RN.  [name] needed total assistance to bed and was given a urinal,fluids, and a meal.  He is loud but currently cooperative.  Tangental speech.  Open to redirection.  Report to security and [employer] for back up assistance as necessary.

## 2013-01-19 NOTE — ED Notes (Signed)
VS to be taken at a later time. Pt was being inappropriate with tech. Nurse informed of Pt behavior.

## 2013-01-19 NOTE — Consult Note (Signed)
Note reviewed and agreed with  

## 2013-01-19 NOTE — Progress Notes (Signed)
[facility], Per Robinette patient has been placed on the waitlist. - Rodman Pickle, MHT

## 2013-01-19 NOTE — ED Provider Notes (Signed)
Medical screening examination/treatment/procedure(s) were performed by non-physician practitioner and as supervising physician I was immediately available for consultation/collaboration.  EKG Interpretation   None        Derwood Kaplan, MD 01/19/13 [phone]

## 2013-01-19 NOTE — Consult Note (Signed)
Psychiatric Specialty Exam: Physical Exam--Denies issues  ROS--Denies physical complaints  Blood pressure 103/62, pulse 74, temperature 98.5 F (36.9 C), temperature source Oral, resp. rate 18, SpO2 97.00%.There is no weight on file to calculate BMI.  General Appearance: Casual  Eye Contact::  Fair  Speech:  Normal Rate  Volume:  Normal  Mood:  Euthymic  Affect:  Congruent  Thought Process:  Coherent  Orientation:  Full (Time, Place, and Person)  Thought Content:  WDL  Suicidal Thoughts:  No  Homicidal Thoughts:  No  Memory:  Immediate;   Fair Recent;   Fair Remote;   Fair  Judgement:  Fair  Insight:  Fair  Psychomotor Activity:  Normal  Concentration:  Fair  Recall:  Fair  Akathisia:  No  Handed:  Right  AIMS (if indicated):     Assets:  Resilience  Sleep:      Patient denies suicidal/homicidal ideations and hallucinations.  [name] would like to return to the group home where he was before and they are willing to have him back.  He appears to be at his baseline at this point and does not warrant admission.  [name] is mentally and physically stable for discharge.  Dr. Ladona Ridgel assessed this patient and concurs with discharge plan.  Herminio Heads. Chereese Cilento, PMH-NP 01/19/1013  [phone]

## 2013-01-19 NOTE — Progress Notes (Signed)
Per discussion with psychiatrist, patient is back at baseline. Pt is taking medications, accepting care, eating, and doing well. CSW discussed with [place] [facility] [place] who is reviewing pt information and agree they feel he is back to baseline. Per discussion with [name] [phone]) pt information will be reviewed. Mr. Jiles Garter is also going to talk with a sister facility in [place].   Catha Gosselin, LCSW [phone]  ED CSW .01/19/2013 1117am

## 2013-01-19 NOTE — ED Notes (Signed)
Out of bed in chair in doorway.  Thearapeutic discussion.

## 2013-01-19 NOTE — ED Notes (Signed)
Pt took meds without problems because security officer told him to take meds for RN. When he finished he said bye to Clinical research associate.

## 2013-01-19 NOTE — Progress Notes (Addendum)
Pt mother called csw, and shared that she is sick, with possible flu. Patient mother asked how patient was doing. Due to pt mother limited english, csw offered to call interpreter.   With interpreter, csw and pt mother discussed disposition options. Pt mother refusing to have patient back home, as pt father is very ill. Pt mother agreeable that patient can return to the [facility] if they will accept patient back. CSW awaiting to hear back from [facility] [place].   Pt was placed in [facility] last year by this Clinical research associate, as pt mother was unabel to handle patient at home. Pt and pt mother opted to have pt discharged from snf in July 2014 and has been home since. Pt mother stated that pt fathers health has declined and she is unable to care for both.   CSW also faxed out patient state wide for snf placement.   Catha Gosselin, LCSW [phone]  ED CSW .01/19/2013 1306pm

## 2013-01-19 NOTE — ED Notes (Signed)
Called [facility], spoke to Tim to see if pt still needs TTS as it was ordered after seen by psychiatry and some of counselor notes as well. Tim will call me back to let me know if TTS is still needed.

## 2013-01-20 ENCOUNTER — Encounter (HOSPITAL_COMMUNITY): Payer: Self-pay | Admitting: Psychiatry

## 2013-01-20 NOTE — Progress Notes (Signed)
CSW has not heard from [name].  CSW called [facility] directly [phone]). Per weekend supervisor Herbert Seta, [name] and [name] (Child psychotherapist for facility)  must sign-off on his admission. [name] did not know the status of that decision. Per Herbert Seta, Benjamin and [name] will not be back in office until Monday.   CSW checked for new responses to fax-out. No acceptances thus far.  [name] [name], [phone]     ED CSW  11:29am

## 2013-01-20 NOTE — ED Notes (Signed)
Pt scooted himself down to the end of his bed, writer got bedpan and went into room and asked him if he needed to go to the bathroom and he said no. He asked writer to empty his urinal and bring back please. He also said something else but Clinical research associate couldn't understand. When urinal was taken back pt asked writer to pick up the trash off his floor and refused to allow her to leave the bedpan in his room he said he doesn't need it and for writer to leave and take with her. He got loud and started making shooting motions at Clinical research associate as if he had a gun shooting her.

## 2013-01-20 NOTE — Progress Notes (Signed)
Staffed with psychiatry and social work.  SW is pursuing facility from which pt was previously living and attempting to arrange for pt to return.  Issues may arise due to weekend and so pt may be in ED through weekend.  However, SW is going to push for return during the weekend.  There is some concern in the notes that previous placement is "reviewing info" and has not "accepted" pt back so SW is attempting to clarify.

## 2013-01-20 NOTE — Progress Notes (Addendum)
CSW called [name] [phone]) to check on status of pt review at [facility], where pt was a previous resident. CSW left message.  Per CSW [employer] note, patient was faxed out to facilities across the state. Of facilities that have answered request ([phone]), all have declined except one--[facility] in [place] is considering. Per discussion with [facility] weekend supervisor, pt is not on list of pt's they can accept over weekend. Per [facility] supervisor, this does not mean he was not accepted; it means pt was not cleared Friday for acceptance. [facility] Child psychotherapist will be back in office on Monday, when, pending her approval, he may be accepted.  Per Brett Canales at [facility], pt still on waitlist.  Per CSW Baxter Hire Stewart's note, [name] is also advocating with other facilities for pt placement.  [name] [place], [phone]     ED CSW  9:11am

## 2013-01-20 NOTE — Consult Note (Signed)
Patient is at his baseline, not combative, participating in his ADLs, denies suicidal/homicidal ideations and hallucinations.  He is to return to the group home he was at before living with his mother. He and his mother are in agreement with this plan.  Dr. Fredda Hammed assessed the patient and agrees with this discharge plan.  Agree with the plan. Patient at baseline.

## 2013-01-21 ENCOUNTER — Encounter (HOSPITAL_COMMUNITY): Payer: Self-pay | Admitting: Emergency Medicine

## 2013-01-21 DIAGNOSIS — Z862 Personal history of diseases of the blood and blood-forming organs and certain disorders involving the immune mechanism: Secondary | ICD-10-CM

## 2013-01-21 NOTE — Consult Note (Signed)
WOC wound consult note Reason for Consult: Areas of redness noted on bilateral hips. Patient is not cooperating and will not allow nursing staff to view.  WOC Consult requested to skin care recommendations. Wound type:Stage I Pressure Ulcers Pressure Ulcer POA: Yes Measurement: Approximately 10 x 10 cm areas of erythema, non-blanching Wound bed:No wound noted Drainage (amount, consistency, odor): None Periwound:Clear, intact. Dressing procedure/placement/frequency: Patient is non-ambulatory and is on a gurney. Limited turning capacity for this body habitus.  Patient refuses hygiene, some meals, prolonged presence of most care providers, particularly women.  Will only allow skin care RN to view hips briefly and will not allow inspection of buttocks.  I attempted to provide cream for the affected areas; he refused.  Dressings are not an option as I feel he will not keep them on.  Dismissed me after approximately 20 minutes.  The only other thing I might add to the treatment plan would be to change his bed linens to DermaTherapy: the house linens that wick moisture and are antimicrobial. While this is an option, the patient wears pajama bottoms, so the linen would not make contact with his skin. I have limited other suggestions other than to maintain nutritional and hydration levels and to have him change his position at least every 2 hours. WOC nursing team will not follow, but will remain available to this patient, the nursing and medical team.  Please re-consult if needed. Thanks, Ladona Mow, MSN, RN, GNP, [place], CWON-AP [phone])

## 2013-01-21 NOTE — Consult Note (Signed)
S-Contacted by Dr [facility] AC at report over concerns about pt sleep/insomnia and loud talking and singing incessantly thru nite making it difficult for other pts to rest.?medication to help.Skin is beginning to breakdown due to pt refusal to use BR/bedpan;bed not protective of skin;pt refusing to co-operate with efforts to check and treat skin.PT HAS HX OF ADRENAL INSUFFICIENCY AND DIABETES INSIPIDUS. ASKED TO ASSESS PT BY ED RN ON ARRIVAL TO UNIT  O-MEDS today Geodon 20 mg po with breakfast;Depakote 250 DR once (BID order);                           Ordered Meds repeat Depakote 250 DR x i/Seroquel 600 mg HS/Ativan 1 mg po q6h (not given due to due hypotension-BP (89/54)     MENTAL STATUS-BEHAVIOR- LYING AWKWARDLY HEAD RAISED 30-40 DEGREES WITH CALVES AND FEET EXTENDED OVER END OF BED.SKIN ON LOWER                                                          EXTREMETIES HAS MULTIPLE CICTARIZED SCRATCH MARKS.ON ONE OCCASION PT DOES MOVE LEGS WITHOUT CHANGING                                                         POSITION.REFUSES TO ALLOW NURSE TO ASSESS HIS SKIN-TELLS HER "YOU ARE NOTHING TO ME"( ie go away and leave me                                                          Alone)                                                          I&O-+ 1260 CC 12 HRS (7A-7P)                                    ORIENTATION-ORIENTED TO PERSON                                    AFFECT- LABILE-FROM EUTHYMIC TO TEARS IN A FLASH                                    PERCEPTION- LACKS INSIGHT                                    THOUGHT- VERY LOOSE ASSOCIATIONS;TANGENTIAL  SPEECH- DIFFICULT TO UNDERSTAND AT TIMES DUE TO ACCENT/SHOUTING OUT LOUD CONSTANTLY WHEN UNATTENDED     Labs-NA 132 ,BUN AND CREATININE ELEVATED (APPEARS TO BE CHRONIC) 10/22   A-HYPOTENSIVE-?MEDICATION-APPEARS TO FLUIDS ARE +     SKIN BREAKING DOWN-PT HAS HAD CONSULT BUT LIMITED DUE TO PT LACK OF CO-OPERATION AND PTS   INABILITY TO CARE FOR SELF     ?ADRENAL INSUFFICIECY P- DISCUSSED SITUATION WITH ED RN-SHE WILL CONTACT EDP WITH [facility] ABOUT ? HOSPITALIST CONSULT &NOTIFY [facility] ED MD ON CALL

## 2013-01-21 NOTE — Progress Notes (Signed)
Per Thurston Hole, pt still on [facility] waitlist.  [name] [place], [phone]     ED CSW

## 2013-01-21 NOTE — ED Notes (Signed)
Spoke with Dr.Ward-stated to push fluids and continue to try and get BP on both arms.

## 2013-01-21 NOTE — Consult Note (Signed)
Subjective:   Patient continues to deny suicidal/homicidal ideation and hallucinations.  Patient states that he is sleeping/eating well.  Patient continues to yell and talk to himself loudly in his native language.  Patient continues to appear at base line and no inpatient admission needed.  Patient states "it is not fare that you woke me.  Leave now so that I can go back to sleep.  I am fine, good. Now you go."   Psychiatric Specialty Exam: Physical Exam--Denies issues  ROS--Denies physical complaints  Blood pressure 93/67, pulse 107, temperature 98.9 F (37.2 C), temperature source Oral, resp. rate 20, SpO2 100.00%.There is no weight on file to calculate BMI.  General Appearance: Casual  Eye Contact::  Fair  Speech:  Normal Rate  Volume:  Normal  Mood:  Euthymic  Affect:  Congruent  Thought Process:  Coherent  Orientation:  Full (Time, Place, and Person)  Thought Content:  WDL  Suicidal Thoughts:  No  Homicidal Thoughts:  No  Memory:  Immediate;   Fair Recent;   Fair Remote;   Fair  Judgement:  Fair  Insight:  Fair  Psychomotor Activity:  Normal  Concentration:  Fair  Recall:  Fair  Akathisia:  No  Handed:  Right  AIMS (if indicated):     Assets:  Resilience  Sleep:      Face to face interview and consult with Dr. Channing Mutters  Plan/Disposition:  Will continue to monitor for safety and stabilization.  SW working on placement into group home.  Will know more information on placement Monday.  Shuvon B. Rankin FNP-BC Family Nurse Practitioner, Board Certified I examined the patient and agree with plan.

## 2013-01-21 NOTE — ED Provider Notes (Signed)
Pt will not allow exam of skin. Pt pending Group Home or [facility] placement. Will re check labs in am (BMP and Depakote level). Pt awake.  Sitting upright. Calm when not stimulated.  Roney Marion, MD 01/21/13 [phone]

## 2013-01-21 NOTE — ED Notes (Signed)
Report given to Ardelia Mems RN

## 2013-01-21 NOTE — ED Notes (Signed)
Tried to notify Dr.Ward of BP 74/53-no answer on her phone.

## 2013-01-21 NOTE — ED Notes (Signed)
Patient presents under IVC d/t refusal to take medication and refusal to allow assistance with ADL's. Patient continues to be noncompliant with medications without the use of show of support. Unable to properly assess patient due patient is combative and uncooperative; unable to do proper skin assessment d/t the inability to turn the patient on ED gurney that is too small to accommodate patient; patient's feet are hanging off end of gurney which is causing an increase of fluid build up in his extremities; patient is now complaining of pain to touch on lower left extremity. Patient is constantly sliding up and down on gurney which is leading to increased risk of sheering; patient has been diagnosed with Stage I pressure ulcer today; patient is at increased risk for further skin breakdown d/t incontinence and noncompliance with care.  Spoke with EDP, Dr. Fayrene Fearing who will be coming to assess patient condition and discuss plan of care. Spoke with Maryjean Morn PA (refer to note) who agrees that patient needs hospitalist consult for recommendation for inpatient admit; also spoke with psychiatrist on call Dr. Dub Mikes who agreed that patient was not in psychiatric crisis and was in need of hospitalist consult due to failure to thrive and increased medical concerns.

## 2013-01-21 NOTE — ED Notes (Signed)
Notified Dr.Ward of BP tending down, most recent BP 74/53 on left arm at 1200. Stated to get manuel BP and call her back.

## 2013-01-21 NOTE — ED Notes (Addendum)
Manuel BP in left arm 80/52. Would not cooperate with BP on right arm. Tried to notify Dr.Ward, no answer on her phone.

## 2013-01-22 DIAGNOSIS — F22 Delusional disorders: Secondary | ICD-10-CM

## 2013-01-22 DIAGNOSIS — R454 Irritability and anger: Secondary | ICD-10-CM

## 2013-01-22 LAB — BASIC METABOLIC PANEL
BUN: 43 mg/dL — ABNORMAL HIGH (ref 6–23)
CO2: 20 mEq/L (ref 19–32)
Chloride: 108 mEq/L (ref 96–112)
GFR calc Af Amer: 48 mL/min — ABNORMAL LOW (ref >90)
GFR calc non Af Amer: 41 mL/min — ABNORMAL LOW (ref >90)
Potassium: 3.5 mEq/L (ref 3.5–5.1)
Sodium: 137 mEq/L (ref 135–145)

## 2013-01-22 LAB — VALPROIC ACID LEVEL: Valproic Acid Lvl: 38.8 ug/mL — ABNORMAL LOW (ref 50.0–100.0)

## 2013-01-22 MED ORDER — HALOPERIDOL LACTATE 5 MG/ML IJ SOLN
5.0000 mg | INTRAMUSCULAR | Status: DC | PRN
  Administered 2013-01-22: 5 mg via INTRAMUSCULAR
  Filled 2013-01-22: qty 1

## 2013-01-22 NOTE — Progress Notes (Signed)
CSW spoke with [name], and patient declined at the [place] [facility] however was going to discuss with DON further. CSW awaiting return call.  CSW spoke with admissions at [facility] in [place] who responded with a bed offer. CSW spoke iwht mother who is agreeable. CSW to asisst with paperwork and interpreter tomorrow.   Catha Gosselin, LCSW [phone]  ED CSW .01/22/2013 .01/22/2013

## 2013-01-22 NOTE — ED Notes (Signed)
Writer and [place], MHT went to give patient a bed bath and change bed lien. Patient refused and told writer to"Fuck Off". Writer tried to convince patient to have bed bath without success. Patient also continues to yell and scream and disturbed the milieu. Encouragement and support provided and safety maintain. Marland Kitchen

## 2013-01-22 NOTE — ED Notes (Signed)
Pt refused to allow writer to assess and treat his wound

## 2013-01-22 NOTE — Consult Note (Signed)
Patient remains irritable, very paranoid, constantly talking.  Dr Lolly Mustache assessed him and based on his past history with the patient, knows he is not at his baseline.  Recommends [facility] for [employer].  I have personally seen the patient and agreed with the findings and involved in the treatment plan. Patients remains paranoid and labile, however started taking his medications.  Kathryne Sharper, MD

## 2013-01-22 NOTE — BH Assessment (Signed)
[facility] Assessment Progress Note  Per Robinette @ [facility], pt is now on priority list for admission.  Later spoke to [facility], confirming that they have pt's IVC paperwork, which was lacking earlier.  Status reported to Everlene Balls, RN, Chiropodist @ [facility].  Doylene Canning, MA Triage Specialist 01/22/2013 @ 22:08

## 2013-01-22 NOTE — ED Notes (Addendum)
Patient bell rang i went and answered it he asked you lady right i said yes sir he said turn my light, on open my door and turn my tv to 20 i did and then he started to ask me about my family in [place] i said i dont know anything about [place] i dont have family over there and i explained to him that i will call someone to fix his tv cause it came on then said no signal.i spoke with someone in facilities and he said he will be up here shortly

## 2013-01-22 NOTE — BHH Counselor (Signed)
  Per Connie's request from [facility], Clinical research associate faxed nursing notes re: pt's aggression in attempt to get pt placed on [facility] wait list. Everlene Balls had called writer to ask if TTS could get pt on [facility]'s priority list.   Evette Cristal, [place] Assessment Counselor

## 2013-01-22 NOTE — Progress Notes (Signed)
Per [name], patient remains on [facility] waitlist.   .Catha Gosselin, LCSW [phone]  ED CSW .01/22/2013 915am

## 2013-01-22 NOTE — Progress Notes (Addendum)
CSW received call from Erskine Squibb at [facility], requesting new legals, Medication Administration Records for X 4 days and Vital Signs X 4 days.  Per Erskine Squibb once all of this information is received and reviewed, pt could possibly be accepted tomorrow for placement.   CSW will notify daytime CSW of updated information needed for tomorrow.  Marva Panda, Theresia Majors  [phone]  .01/22/2013

## 2013-01-23 NOTE — Progress Notes (Signed)
Pt mother came to visit patient. Per pt mother, and use of interpreter line, pt is still very sick. Pt is fixated on pt brother and brother's girlfriend who is missing. Per pt mother, brother and brother's girlfriend were lost in the war, and also prisoner of war. Per pt mother, he just keeps asking. Patient mother feels pt more appropriate to go to [place].   Catha Gosselin, LCSW [phone]  ED CSW .01/23/2013 1032am

## 2013-01-23 NOTE — ED Notes (Signed)
Pt requested this writer help move this right knee several times. This Clinical research associate offered to do range of motion with his right knee and ankle. Pt accepted and range of motion was performed on right knee and ankle. Pt was repositioned after range of motion was completed. Pt stated "Thank you".

## 2013-01-23 NOTE — ED Notes (Signed)
Patient was sleeping rn notified

## 2013-01-23 NOTE — ED Notes (Signed)
Pt continues to refuse to allow writer to assess and treat his wound. He is refusing a bed bath, but he did allow a linen and scrub change
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
## 2013-01-23 NOTE — Progress Notes (Addendum)
CSW spoke with pt mother/guardian, with interpreter regarding guardianship papers. Pt mother/guardian to fax around 10 am. Pt anticipated to go to [facility] today or if not available, and pt clears later to go to skilled nursing.   Catha Gosselin, LCSW [phone]  ED CSW .01/23/2013 818am   Pt mother bringing guardianship papers, and clothes for patient. Pt mother/guardian to call CSW when she arrives.   Catha Gosselin, LCSW [phone]  ED CSW .01/23/2013 938am

## 2013-01-23 NOTE — ED Notes (Signed)
Patient accepted at [facility] by  Rentchler, RN. Nurses reported that report can be called at after change of shift; [phone].

## 2013-01-23 NOTE — Progress Notes (Signed)
CSW spoke with Junious Dresser at [facility], patient ready to be transferred and admitted to [facility]. CSW spoke with RN who is arranging sheriff and non emergency ambulance transportation.   Catha Gosselin, LCSW [phone]  ED CSW .01/23/2013 1147am

## 2013-01-23 NOTE — Consult Note (Signed)
  Psychiatric Specialty Exam: Physical Exam  ROS  Blood pressure 98/61, pulse 86, temperature 98.6 F (37 C), temperature source Oral, resp. rate 18, SpO2 96.00%.There is no weight on file to calculate BMI.  General Appearance: Disheveled  Eye Solicitor::  Fair  Speech:  hard to understand due to his accent  Volume:  Increased  Mood:  Dysphoric  Affect:  Flat  Thought Process:  Irrelevant  Orientation:  Full (Time, Place, and Person)  Thought Content:  focuses on irrelevant things such as food rather than answering the question asked  Suicidal Thoughts:  No  Homicidal Thoughts:  No  Memory:  Cannot really judge  Judgement:  Impaired  Insight:  Lacking  Psychomotor Activity:  in bed and crawls to get what he wants  Concentration:  Poor  Recall:  Poor  Akathisia:  Negative  Handed:  Right  AIMS (if indicated):     Assets:  Others:  has mother's support  Sleep:   adequate    Mr Cortright will be transferred to [facility] today.  He is the same.  He seems to have cognitive dysfunction as opposed to psychosis.  According to his mother he perseverates on what happened to his brother and sister  In the war.  No behavioral problem and not as talkative or as loud as last week.

## 2013-01-25 NOTE — Consult Note (Signed)
Agree with plan 

## 2014-12-18 ENCOUNTER — Emergency Department (HOSPITAL_COMMUNITY): Payer: Medicaid Other

## 2014-12-18 ENCOUNTER — Emergency Department (HOSPITAL_COMMUNITY)
Admission: EM | Admit: 2014-12-18 | Discharge: 2014-12-18 | Disposition: A | Discharge status: Home / Self Care | Payer: Medicaid Other | Attending: Emergency Medicine | Admitting: Emergency Medicine

## 2014-12-18 ENCOUNTER — Encounter (HOSPITAL_COMMUNITY): Payer: Self-pay

## 2014-12-18 DIAGNOSIS — K219 Gastro-esophageal reflux disease without esophagitis: Secondary | ICD-10-CM | POA: Diagnosis not present

## 2014-12-18 DIAGNOSIS — D649 Anemia, unspecified: Secondary | ICD-10-CM | POA: Diagnosis not present

## 2014-12-18 DIAGNOSIS — M25551 Pain in right hip: Secondary | ICD-10-CM | POA: Insufficient documentation

## 2014-12-18 DIAGNOSIS — Z880 Allergy status to penicillin: Secondary | ICD-10-CM | POA: Diagnosis not present

## 2014-12-18 DIAGNOSIS — Z8619 Personal history of other infectious and parasitic diseases: Secondary | ICD-10-CM | POA: Insufficient documentation

## 2014-12-18 DIAGNOSIS — Z87448 Personal history of other diseases of urinary system: Secondary | ICD-10-CM | POA: Insufficient documentation

## 2014-12-18 DIAGNOSIS — F259 Schizoaffective disorder, unspecified: Secondary | ICD-10-CM | POA: Insufficient documentation

## 2014-12-18 DIAGNOSIS — R52 Pain, unspecified: Secondary | ICD-10-CM

## 2014-12-18 DIAGNOSIS — Z79899 Other long term (current) drug therapy: Secondary | ICD-10-CM | POA: Insufficient documentation

## 2014-12-18 DIAGNOSIS — Z8709 Personal history of other diseases of the respiratory system: Secondary | ICD-10-CM | POA: Insufficient documentation

## 2014-12-18 DIAGNOSIS — Z720 Tobacco use: Secondary | ICD-10-CM | POA: Diagnosis not present

## 2014-12-18 DIAGNOSIS — Z9889 Other specified postprocedural states: Secondary | ICD-10-CM | POA: Diagnosis not present

## 2014-12-18 DIAGNOSIS — Z8639 Personal history of other endocrine, nutritional and metabolic disease: Secondary | ICD-10-CM | POA: Diagnosis not present

## 2014-12-18 DIAGNOSIS — Z8701 Personal history of pneumonia (recurrent): Secondary | ICD-10-CM | POA: Insufficient documentation

## 2014-12-18 MED ORDER — HYDROCODONE-ACETAMINOPHEN 5-325 MG PO TABS: 1.0000 | ORAL_TABLET | Freq: Four times a day (QID) | ORAL | Status: DC | PRN

## 2014-12-18 MED ORDER — HYDROCODONE-ACETAMINOPHEN 5-325 MG PO TABS
2.0000 | ORAL_TABLET | Freq: Once | ORAL | Status: AC
  Administered 2014-12-18: 2 via ORAL
  Filled 2014-12-18: qty 2

## 2014-12-18 MED ORDER — HYDROMORPHONE HCL 1 MG/ML IJ SOLN
1.0000 mg | Freq: Once | INTRAMUSCULAR | Status: AC
  Administered 2014-12-18: 1 mg via INTRAVENOUS
  Filled 2014-12-18: qty 1

## 2014-12-18 NOTE — ED Provider Notes (Signed)
CSN: [id number]     Arrival date & time 12/18/14  1542 History   First MD Initiated Contact with Patient 12/18/14 1557     Chief Complaint  Patient presents with  . Hip Pain     (Consider location/radiation/quality/duration/timing/severity/associated sxs/prior Treatment) HPI Comments: 54 year old male here with his pain. Began immediately after standing up earlier today. Does have history of hip surgery previously, he cannot tell me exactly what the surgery was but states he does not have a prosthesis. He did recently have a right knee injection for knee pain which has improved.  Patient is a 54 y.o. male presenting with hip pain. The history is provided by the patient.  Hip Pain This is a new problem. The current episode started 1 to 2 hours ago. The problem occurs constantly. The problem has not changed since onset.Pertinent negatives include no abdominal pain and no shortness of breath. Nothing aggravates the symptoms. Nothing relieves the symptoms.    Past Medical History  Diagnosis Date  . Kidney disease   . GERD (gastroesophageal reflux disease)   . Liver disease   . Hepatitis C   . Ascites   . Anemia   . PTSD (post-traumatic stress disorder)   . Personality disorder   . History of ARDS   . Hx of Clostridium difficile infection   . Schizoaffective disorder   . PTSD (post-traumatic stress disorder)     SECONDARY TO WAR IN [place]  . Overdose 12/2009  . Peripheral neuropathy   . Aspiration pneumonia     HX OF  . Pleural effusion, bilateral     HX OF  . History of blood transfusion   . History of adrenal insufficiency   . H/O diabetes insipidus   . Hypothyroidism    Past Surgical History  Procedure Laterality Date  . Right hip surgery  1996  . US guided left thoracentesis  01/21/2010   No family history on file. Social History  Substance Use Topics  . Smoking status: Current Every Day Smoker -- 1.00 packs/day for 25 years    Types: Cigarettes  . Smokeless  tobacco: Never Used  . Alcohol Use: No     Comment: Pt denies     Review of Systems  Constitutional: Negative for fever.  Respiratory: Negative for cough and shortness of breath.   Gastrointestinal: Negative for vomiting and abdominal pain.  All other systems reviewed and are negative.     Allergies  Heparin; Imipramine; Lamictal; and Penicillins  Home Medications   Prior to Admission medications   Medication Sig Start Date End Date Taking? Authorizing Provider  b complex-vitamin c-folic acid (NEPHRO-VITE) 0.8 MG TABS Take 0.8 mg by mouth 4 (four) times daily.    Historical Provider, MD  benztropine (COGENTIN) 0.5 MG tablet Take 0.5 mg by mouth daily.    Historical Provider, MD  divalproex (DEPAKOTE) 250 MG DR tablet Take 250 mg by mouth at bedtime.     Historical Provider, MD  ferrous sulfate 325 (65 FE) MG tablet Take 325 mg by mouth 3 (three) times daily with meals.     Historical Provider, MD  QUEtiapine (SEROQUEL) 300 MG tablet Take 600 mg by mouth at bedtime.    Historical Provider, MD  spironolactone (ALDACTONE) 100 MG tablet Take 100 mg by mouth 2 (two) times daily.     Historical Provider, MD  ziprasidone (GEODON) 20 MG capsule Take 1 capsule (20 mg total) by mouth daily after breakfast. 11/08/11 02/06/12  Devoria Albe, MD  ziprasidone (GEODON) 20 MG capsule Take 20 mg by mouth daily.    Historical Provider, MD   BP 129/68 mmHg  Pulse 78  Temp(Src) 97.8 F (36.6 C) (Oral)  SpO2 94% Physical Exam  Constitutional: He is oriented to person, place, and time. He appears well-developed and well-nourished. No distress.  HENT:  Head: Normocephalic and atraumatic.  Mouth/Throat: Oropharynx is clear and moist. No oropharyngeal exudate.  Eyes: EOM are normal. Pupils are equal, round, and reactive to light.  Neck: Normal range of motion. Neck supple.  Cardiovascular: Normal rate and regular rhythm.  Exam reveals no friction rub.   No murmur heard. Pulmonary/Chest: Effort normal  and breath sounds normal. No respiratory distress. He has no wheezes. He has no rales.  Abdominal: Soft. He exhibits no distension. There is no tenderness. There is no rebound.  Musculoskeletal: He exhibits no edema.       Right hip: He exhibits decreased range of motion. He exhibits normal strength, no tenderness, no bony tenderness and no deformity.  Neurological: He is alert and oriented to person, place, and time.  Skin: No rash noted. He is not diaphoretic.  Nursing note and vitals reviewed.   ED Course  Procedures (including critical care time) Labs Review Labs Reviewed - No data to display  Imaging Review Dg Hip Unilat With Pelvis 2-3 Views Right  12/18/2014   CLINICAL DATA:  Acute right hip pain after trying to stand up today at home.  EXAM: DG HIP (WITH OR WITHOUT PELVIS) 2-3V RIGHT  COMPARISON:  November 07, 2011.  FINDINGS: No definite fracture is noted. No dislocation is noted, but lateral subluxation of right femoral head is noted. Deformity of the proximal right femur is noted consistent with old fracture or severe degenerative change. Severe narrowing and sclerosis of the superior portion of the joint space is noted consistent with degenerative change.  IMPRESSION: Severe degenerative joint disease is noted. Stable deformity of the right femoral neck and proximal femur is noted consistent with old healed fracture or severe degenerative change. No acute fracture is noted. However, there is lateral subluxation of the right femoral head compared to prior exam.   Electronically Signed   By: Lupita Raider, M.D.   On: 12/18/2014 17:58   I have personally reviewed and evaluated these images and lab results as part of my medical decision-making.   EKG Interpretation None      MDM   Final diagnoses:  Right hip pain    53 year old male here with right hip pain. Began after standing. Concern for dislocation. Does not have any leg shortening or tenderness. He has some difficulty  with range of motion but can flex and extend the knee and hip. Pain meds given, will xray.  X-ray shows some stable chronic changes. I spoke with Dr. Eulah Pont, he recommended knee immobilizer and follow-up with Dr. Dion Saucier soon. Given prescription for Vicodin   Elwin Mocha, MD 12/18/14 2345

## 2014-12-18 NOTE — ED Notes (Signed)
Bed: YQ82 Expected date:  Expected time:  Means of arrival:  Comments: Ems- hip pain

## 2014-12-18 NOTE — ED Notes (Signed)
He states he felt sudden pain in his right hip upon standing.  He received of Fentanyl IV en route to hospital.  CMS intact all toes bilat.

## 2014-12-18 NOTE — ED Notes (Signed)
Pt and family verbalized understanding of discharge instructions. Pt to WR via WC.

## 2014-12-18 NOTE — Discharge Instructions (Signed)
Hip Pain Your hip is the joint between your upper legs and your lower pelvis. The bones, cartilage, tendons, and muscles of your hip joint perform a lot of work each day supporting your body weight and allowing you to move around. Hip pain can range from a minor ache to severe pain in one or both of your hips. Pain may be felt on the inside of the hip joint near the groin, or the outside near the buttocks and upper thigh. You may have swelling or stiffness as well.  HOME CARE INSTRUCTIONS   Take medicines only as directed by your health care provider.  Apply ice to the injured area:  Put ice in a plastic bag.  Place a towel between your skin and the bag.  Leave the ice on for 15-20 minutes at a time, 3-4 times a day.  Keep your leg raised (elevated) when possible to lessen swelling.  Avoid activities that cause pain.  Follow specific exercises as directed by your health care provider.  Sleep with a pillow between your legs on your most comfortable side.  Record how often you have hip pain, the location of the pain, and what it feels like. SEEK MEDICAL CARE IF:   You are unable to put weight on your leg.  Your hip is red or swollen or very tender to touch.  Your pain or swelling continues or worsens after 1 week.  You have increasing difficulty walking.  You have a fever. SEEK IMMEDIATE MEDICAL CARE IF:   You have fallen.  You have a sudden increase in pain and swelling in your hip. MAKE SURE YOU:   Understand these instructions.  Will watch your condition.  Will get help right away if you are not doing well or get worse. Document Released: 09/02/2009 Document Revised: 07/30/2013 Document Reviewed: 11/09/2012 Exit[employer] Patient Information 2015 [employer]. This information is not intended to replace advice given to you by your health care provider. Make sure you discuss any questions you have with your health care provider.  

## 2014-12-18 NOTE — Progress Notes (Signed)
Pt informed CM he has been "home for two months" (since July 2016) Reports he was at the "clinic" " [facility]" " a long time" CM noted pt had not been in a [facility] facility since 2014 (pt was last seen 01/17/13) Pt recalls "I had pneumonia when I was here last" Pt reports not having to see a dr recently  " I mentally and physically better except my hip" Pt indicating pain in his right hip he is pointing at " I for while keep it to myself", related to hip pain.  Pt reports first noting the hip pain when he "went to turn on my DVD one day"  Pt pleasant and thanked Cm for visiting, shaking CM hand  Pt's mother at the bedside, supportive  Mother speaking minimal english Pt assisting to transfer for her

## 2015-04-17 ENCOUNTER — Encounter (HOSPITAL_COMMUNITY): Payer: Self-pay | Admitting: Emergency Medicine

## 2015-04-17 ENCOUNTER — Emergency Department (HOSPITAL_COMMUNITY): Payer: Medicaid Other

## 2015-04-17 ENCOUNTER — Emergency Department (HOSPITAL_COMMUNITY)
Admission: EM | Admit: 2015-04-17 | Discharge: 2015-04-17 | Disposition: A | Discharge status: Home / Self Care | Payer: Medicaid Other | Attending: Emergency Medicine | Admitting: Emergency Medicine

## 2015-04-17 DIAGNOSIS — K219 Gastro-esophageal reflux disease without esophagitis: Secondary | ICD-10-CM | POA: Diagnosis not present

## 2015-04-17 DIAGNOSIS — E039 Hypothyroidism, unspecified: Secondary | ICD-10-CM | POA: Diagnosis not present

## 2015-04-17 DIAGNOSIS — Z79899 Other long term (current) drug therapy: Secondary | ICD-10-CM | POA: Diagnosis not present

## 2015-04-17 DIAGNOSIS — Z87448 Personal history of other diseases of urinary system: Secondary | ICD-10-CM | POA: Insufficient documentation

## 2015-04-17 DIAGNOSIS — S92301A Fracture of unspecified metatarsal bone(s), right foot, initial encounter for closed fracture: Secondary | ICD-10-CM

## 2015-04-17 DIAGNOSIS — Z8619 Personal history of other infectious and parasitic diseases: Secondary | ICD-10-CM | POA: Insufficient documentation

## 2015-04-17 DIAGNOSIS — S9031XA Contusion of right foot, initial encounter: Secondary | ICD-10-CM | POA: Insufficient documentation

## 2015-04-17 DIAGNOSIS — Z8701 Personal history of pneumonia (recurrent): Secondary | ICD-10-CM | POA: Insufficient documentation

## 2015-04-17 DIAGNOSIS — Y9389 Activity, other specified: Secondary | ICD-10-CM | POA: Diagnosis not present

## 2015-04-17 DIAGNOSIS — G629 Polyneuropathy, unspecified: Secondary | ICD-10-CM | POA: Insufficient documentation

## 2015-04-17 DIAGNOSIS — Y998 Other external cause status: Secondary | ICD-10-CM | POA: Insufficient documentation

## 2015-04-17 DIAGNOSIS — Z862 Personal history of diseases of the blood and blood-forming organs and certain disorders involving the immune mechanism: Secondary | ICD-10-CM | POA: Diagnosis not present

## 2015-04-17 DIAGNOSIS — S99921A Unspecified injury of right foot, initial encounter: Secondary | ICD-10-CM | POA: Diagnosis present

## 2015-04-17 DIAGNOSIS — F17210 Nicotine dependence, cigarettes, uncomplicated: Secondary | ICD-10-CM | POA: Insufficient documentation

## 2015-04-17 DIAGNOSIS — Y92009 Unspecified place in unspecified non-institutional (private) residence as the place of occurrence of the external cause: Secondary | ICD-10-CM | POA: Insufficient documentation

## 2015-04-17 DIAGNOSIS — S92334A Nondisplaced fracture of third metatarsal bone, right foot, initial encounter for closed fracture: Secondary | ICD-10-CM | POA: Diagnosis not present

## 2015-04-17 DIAGNOSIS — Z880 Allergy status to penicillin: Secondary | ICD-10-CM | POA: Insufficient documentation

## 2015-04-17 DIAGNOSIS — W1839XA Other fall on same level, initial encounter: Secondary | ICD-10-CM | POA: Diagnosis not present

## 2015-04-17 MED ORDER — OXYCODONE-ACETAMINOPHEN 7.5-325 MG PO TABS: 1.0000 | ORAL_TABLET | Freq: Four times a day (QID) | ORAL | Status: DC | PRN

## 2015-04-17 NOTE — Discharge Instructions (Signed)
[name],  Nice meeting you! Please follow-up with your orthopedic doctor. Call them tomorrow. Return to the emergency department if you develop increasing pain, are unable to walk, develop numbness in your foot. Feel better soon!  S. Lane Hacker, PA-C

## 2015-04-17 NOTE — ED Notes (Signed)
Pt stating "I can take a taxi if you take me to the taxi in a w/c."  Informed pt will speak to Charge RN.  Pt verbalized understanding.  Pt was provided a dinner tray but did not eat anything stating "I'm not hungry".

## 2015-04-17 NOTE — ED Notes (Signed)
Pt is d/ced, awaiting ptar tx home

## 2015-04-17 NOTE — ED Notes (Signed)
Ortho paged for cam walker.  

## 2015-04-17 NOTE — ED Provider Notes (Signed)
CSN: [id number]     Arrival date & time 04/17/15  1636 History   First MD Initiated Contact with Patient 04/17/15 1646     Chief Complaint  Patient presents with  . Fall   HPI   [name] is a 55 y.o. M PMH significant for PTSD, schizoaffective disorder presenting s/p mechanical fall an hour ago, with his right knee and right foot landing on concrete. He endorses right knee and swelling/bruising overlying the top of his right foot. He denies pain. No LOC, head injury, HA, CP, abdominal pain, N/V, change in bowel/bladder habits, bladder/bowel incontinence.   Past Medical History  Diagnosis Date  . Kidney disease   . GERD (gastroesophageal reflux disease)   . Liver disease   . Hepatitis C   . Ascites   . Anemia   . PTSD (post-traumatic stress disorder)   . Personality disorder   . History of ARDS   . Hx of Clostridium difficile infection   . Schizoaffective disorder   . PTSD (post-traumatic stress disorder)     SECONDARY TO WAR IN [place]  . Overdose 12/2009  . Peripheral neuropathy   . Aspiration pneumonia     HX OF  . Pleural effusion, bilateral     HX OF  . History of blood transfusion   . History of adrenal insufficiency   . H/O diabetes insipidus   . Hypothyroidism    Past Surgical History  Procedure Laterality Date  . Right hip surgery  1996  . US guided left thoracentesis  01/21/2010   No family history on file. Social History  Substance Use Topics  . Smoking status: Current Every Day Smoker -- 1.00 packs/day for 25 years    Types: Cigarettes  . Smokeless tobacco: Never Used  . Alcohol Use: No     Comment: Pt denies     Review of Systems  Ten systems are reviewed and are negative for acute change except as noted in the HPI  Allergies  Heparin; Imipramine; Lamictal; and Penicillins  Home Medications   Prior to Admission medications   Medication Sig Start Date End Date Taking? Authorizing Provider  b complex vitamins capsule Take 1 capsule by mouth  daily.    Historical Provider, MD  benztropine (COGENTIN) 1 MG tablet Take 1 mg by mouth 3 (three) times daily.    Historical Provider, MD  Cholecalciferol (VITAMIN D-3) 1000 UNITS CAPS Take 2,000 Units by mouth daily.    Historical Provider, MD  diphenhydrAMINE (SOMINEX) 25 MG tablet Take 25 mg by mouth at bedtime.    Historical Provider, MD  furosemide (LASIX) 20 MG tablet Take 20 mg by mouth 2 (two) times daily.    Historical Provider, MD  gabapentin (NEURONTIN) 100 MG capsule Take 200 mg by mouth 3 (three) times daily.    Historical Provider, MD  haloperidol lactate (HALDOL) 5 MG/ML injection every 30 (thirty) days.    Historical Provider, MD  HYDROcodone-acetaminophen (NORCO/VICODIN) 5-325 MG per tablet Take 1 tablet by mouth every 6 (six) hours as needed for moderate pain. 12/18/14   Elwin Mocha, MD  LACTOBACILLUS PO Take 1 capsule by mouth daily.    Historical Provider, MD  meloxicam (MOBIC) 15 MG tablet Take 1 tablet by mouth daily as needed. pain 12/11/14   Historical Provider, MD  omeprazole (PRILOSEC) 20 MG capsule Take 20 mg by mouth daily.    Historical Provider, MD  rifaximin (XIFAXAN) 550 MG TABS tablet Take 550 mg by mouth 2 (two) times daily.  Historical Provider, MD  spironolactone (ALDACTONE) 50 MG tablet Take 50 mg by mouth daily.    Historical Provider, MD  traMADol (ULTRAM) 50 MG tablet Take 50 mg by mouth every 6 (six) hours as needed. pain 12/03/14   Historical Provider, MD   BP 120/64 mmHg  Pulse 78  Temp(Src) 97.7 F (36.5 C) (Oral)  Resp 16  SpO2 95% Physical Exam  Constitutional: He appears well-developed and well-nourished. No distress.  HENT:  Head: Normocephalic and atraumatic.  Mouth/Throat: Oropharynx is clear and moist. No oropharyngeal exudate.  Eyes: Conjunctivae are normal. Pupils are equal, round, and reactive to light. Right eye exhibits no discharge. Left eye exhibits no discharge. No scleral icterus.  Neck: No tracheal deviation present.   Cardiovascular: Normal rate, regular rhythm, normal heart sounds and intact distal pulses.  Exam reveals no gallop and no friction rub.   No murmur heard. Pulmonary/Chest: Effort normal and breath sounds normal. No respiratory distress. He has no wheezes. He has no rales. He exhibits no tenderness.  Abdominal: Soft. Bowel sounds are normal. He exhibits no distension and no mass. There is no tenderness. There is no rebound and no guarding.  Musculoskeletal: Normal range of motion. He exhibits edema and tenderness.  Mild edema and ecchymosis on dorsal aspect of right foot.   Lymphadenopathy:    He has no cervical adenopathy.  Neurological: He is alert. Coordination normal.  Skin: Skin is warm and dry. No rash noted. He is not diaphoretic. No erythema.  Psychiatric: He has a normal mood and affect. His behavior is normal.  Nursing note and vitals reviewed.   ED Course  Procedures  Imaging Review Dg Ankle Complete Right  04/17/2015  CLINICAL DATA:  Diffuse right ankle pain following a fall at home. EXAM: RIGHT ANKLE - COMPLETE 3+ VIEW COMPARISON:  None. FINDINGS: Diffuse osteopenia. No fracture, dislocation or effusion seen. Atheromatous arterial calcifications. Mild calcaneal spur formation. IMPRESSION: No fracture or dislocation. Electronically Signed   By: Beckie Salts M.D.   On: 04/17/2015 17:25   Dg Knee Complete 4 Views Right  04/17/2015  CLINICAL DATA:  Fall.  Knee pain. EXAM: RIGHT KNEE - COMPLETE 4+ VIEW COMPARISON:  03/17/2009 FINDINGS: Prominent bony demineralization. Tricompartmental articular space narrowing with marginal spurring compatible with osteoarthritis. Hazy cortical irregularity of the proximal fibular metaphysis suspicious for potential nondisplaced fracture, correlate with point tenderness over the proximal fibula. No significant effusion is observed in the suprapatellar bursa but there is some apparent fluid infiltration of Hoffa's fat pad. IMPRESSION: 1. Potential  nondisplaced fracture of the proximal fibular metaphysis, correlate with point tenderness. 2. Osteoarthritis 3. Prominent bony demineralization, which reduces sensitivity for subtle fractures. Electronically Signed   By: Gaylyn Rong M.D.   On: 04/17/2015 17:29   Dg Foot Complete Right  04/17/2015  CLINICAL DATA:  55 year old male with acute right foot pain following fall. Initial encounter. EXAM: RIGHT FOOT COMPLETE - 3+ VIEW COMPARISON:  None. FINDINGS: A nondisplaced oblique fracture of the proximal and mid first metatarsal is noted extending to the tarsometatarsal joint. A nondisplaced fracture of the third metatarsal neck is present. Lucency at the region of the second metatarsal head is identified adjacent to moderate apparent callus formation - question acute fracture at the distal end of remote injury. There is no evidence of subluxation or dislocation. Soft tissue swelling is present. IMPRESSION: Nondisplaced intraarticular fracture of the proximal/ mid first metatarsal and nondisplaced fracture of the third metatarsal neck. Lucency along the second metatarsal head  along the distal edge of apparent callus formation, question acute fracture or chronic changes at the distal aspect of remote injury. Electronically Signed   By: Harmon Pier M.D.   On: 04/17/2015 17:40   I have personally reviewed and evaluated these images and lab results as part of my medical decision-making.  MDM   Final diagnoses:  Metatarsal bone fracture, right, closed, initial encounter   Patient non-toxic appearing and VSS. Xrays obtained of right ankle, right foot, and right knee.  Right foot xray demonstrates nondisplaced intraarticular fracture of the proximal/ mid first metatarsal and nondisplaced fracture of the third metatarsal neck. Lucency along the second metatarsal head along the distal edge of apparent callus formation, question acute fracture or chronic changes at the distal aspect of remote injury. Right  knee xray suggests potential nondisplaced fracture of the proximal fibular metaphysis, and advised to correlate with point tenderness. There was osteoarthritis and prominent bony demineralization, which reduces sensitivity for subtle fractures. Patient does not have point tenderness at proximal fibula.  Right ankle xray unremarkable. Dr. Adriana Simas evaluated patient as well and advised cam walker with ortho follow-up. Patient may be safely discharged home. Patient denies pain currently, but will send home with percocet. Discussed reasons for return. Patient to call ortho tomorrow and make an appt to follow-up. Patient in understanding and agreement with the plan.   Melton Krebs, PA-C 04/19/15 1456  Donnetta Hutching, MD 04/19/15 [phone]

## 2015-04-17 NOTE — ED Notes (Signed)
Patient presents from home via EMS for fall. Original call for patient's mother who fell, patient came outside and fell, no LOC, no obvious deformity, c/o right ankle pain. Patient normally walks with cane  124/70, 86hr.

## 2015-08-21 ENCOUNTER — Encounter: Payer: Self-pay | Admitting: Physical Therapy

## 2015-08-21 ENCOUNTER — Ambulatory Visit: Payer: Medicaid Other | Attending: Internal Medicine | Admitting: Physical Therapy

## 2015-08-21 DIAGNOSIS — R2689 Other abnormalities of gait and mobility: Secondary | ICD-10-CM

## 2015-08-21 DIAGNOSIS — R2681 Unsteadiness on feet: Secondary | ICD-10-CM | POA: Diagnosis present

## 2015-08-21 DIAGNOSIS — M6281 Muscle weakness (generalized): Secondary | ICD-10-CM

## 2015-08-22 NOTE — Therapy (Signed)
[facility] [address] [place], [place], [address] Phone: [phone]   Fax:  [phone]  Physical Therapy Evaluation  Patient Details  Name: [name] MRN: [id number] Date of Birth: 03-23-1961 Referring Provider: Willey Blade, MD  Encounter Date: 08/21/2015      PT End of Session - 08/21/15 1323    Visit Number 1   Number of Visits 4   Authorization Type Medicaid   PT Start Time 0850   PT Stop Time 0930   PT Time Calculation (min) 40 min   Equipment Utilized During Treatment Gait belt   Activity Tolerance Patient tolerated treatment well   Behavior During Therapy [facility] for tasks assessed/performed      Past Medical History  Diagnosis Date  . Kidney disease   . GERD (gastroesophageal reflux disease)   . Liver disease   . Hepatitis C   . Ascites   . Anemia   . PTSD (post-traumatic stress disorder)   . Personality disorder   . History of ARDS   . Hx of Clostridium difficile infection   . Schizoaffective disorder (HCC)   . PTSD (post-traumatic stress disorder)     SECONDARY TO WAR IN [place]  . Overdose 12/2009  . Peripheral neuropathy (HCC)   . Aspiration pneumonia (HCC)     HX OF  . Pleural effusion, bilateral     HX OF  . History of blood transfusion   . History of adrenal insufficiency   . H/O diabetes insipidus   . Hypothyroidism     Past Surgical History  Procedure Laterality Date  . Right hip surgery  1996  . US guided left thoracentesis  01/21/2010    There were no vitals filed for this visit.       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 He reports increased weakness and difficulty walking with fall  in January resulting in 3 fractures in his right foot.    Patient is accompained by: Family member   Pertinent History Chronic kidney disease (stage III),  cirrhosis of liver, depression, GERD, Hep C, HTN, Schizophrenia (Paranoid Type), PTSD war in [place], peripheral Neuropathy, right hip fx with sg 1996   Limitations Lifting;Standing;Walking;House hold activities   Patient Stated Goals to strength right side.    Currently in Pain? 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 1  leg gave out, broke 3 bones in right foot   Has the patient had a decrease  in activity level because of a fear of falling?  No   Is the patient reluctant to leave their home because of a fear of falling?  No   Home Environment   Living Environment Private residence   Living Arrangements Parent   Type of Home Apartment   Home Access Level entry   Home Layout Two level;1/2 bath on main level   Alternate Level Stairs-Number of Steps 13   Alternate Level Stairs-Rails Left   Prior Function   Level of Independence Independent;Independent with household mobility with device;Independent with community mobility with device   Vocation On disability   Observation/Other Assessments   Focus on Therapeutic Outcomes (FOTO)  NP   Posture/Postural Control   Posture/Postural Control Postural limitations   Postural Limitations Rounded Shoulders;Forward head;Increased thoracic  kyphosis;Increased lumbar lordosis;Flexed trunk   ROM / Strength   AROM / PROM / Strength AROM;Strength   AROM   Overall AROM  Within functional limits for tasks performed   Overall AROM Comments tightness in bilateral hip flexors, hamstrings & heelcords   Strength   Overall Strength Deficits   Overall Strength Comments BUEs WFL   Strength Assessment Site Hip;Knee;Ankle   Right/Left Hip Right;Left   Right Hip Flexion 2+/5   Right Hip Extension 2-/5   Right Hip ABduction 2+/5   Left Hip Flexion 4/5   Right/Left Knee Right;Left   Right Knee Flexion 2+/5   Right Knee Extension 3-/5   Left Knee Flexion 5/5   Left Knee Extension 5/5   Right/Left Ankle Right;Left   Right Ankle Dorsiflexion 4/5   Left Ankle Dorsiflexion 5/5   Transfers   Transfers Sit to Stand;Stand to Sit   Sit to Stand 5: Supervision;With upper extremity assist;With armrests;From chair/3-in-1  to RW for stabilization   Stand to Sit 5: Supervision;With upper extremity assist;With armrests;To chair/3-in-1;Uncontrolled descent  from RW for stabilization   Ambulation/Gait   Ambulation/Gait Yes   Ambulation/Gait Assistance 5: Supervision   Ambulation Distance (Feet) 50 Feet   Assistive device Rolling walker   Gait Pattern Step-through pattern;Decreased step length - right;Decreased hip/knee flexion - right;Right steppage;Right foot flat;Right flexed knee in stance;Left flexed knee in stance;Trunk flexed;Wide base of support   Ambulation Surface Indoor;Level   Gait velocity 0.84 ft/sec   <1.8 ft/sec indicates fall risk; <1.31 ft/sec indicates hous   Standardized Balance Assessment   Standardized Balance Assessment Berg Balance Test;Timed Up and Go Test   Berg Balance Test   Sit to Stand Needs minimal aid to stand or to stabilize   Standing Unsupported Able to stand 2 minutes with supervision   Sitting with Back Unsupported but Feet Supported on Floor or Stool Able to sit safely and securely 2 minutes   Stand to Sit  Uses backs of legs against chair to control descent   Transfers Able to transfer safely, definite need of hands   Standing Unsupported with Eyes Closed Able to stand 10 seconds with supervision   Standing Ubsupported with Feet Together Able to place feet together independently but unable to hold for 30 seconds   From Standing, Reach Forward with Outstretched Arm Can reach forward >5 cm safely (2")   From Standing Position, Pick up Object from Floor Unable to try/needs assist to keep balance   From Standing Position, Turn to Look Behind Over each Shoulder Needs supervision when turning   Turn 360 Degrees Needs assistance while turning   Standing Unsupported, Alternately Place Feet on Step/Stool Needs assistance to keep from falling  or unable to try   Standing Unsupported, One Foot in [employer] balance while stepping or standing   Standing on One Leg Unable to try or needs assist to prevent fall   Total Score 21   Timed Up and Go Test   Normal TUG (seconds) 46.56  rolling walker                                PT Long Term Goals - 08/21/15 1245    PT LONG TERM GOAL #1   Title Patient demonstrates understanding of HEP to improve strength, flexibility, balance & endurance. (Target Date: 3rd visit after evaluation)   Baseline Patient is unknowledgeable in appropriate exercises to address medical condition.    Status New   PT LONG TERM GOAL #2   Title Patient ambulates 200' with RW modified independent to enable basic community mobility. (Target Date: 3rd visit after evaluation)   Baseline Patient ambulates 16' with RW with supervision. (Target Date: 3rd visit after evaluation)   Status New   PT LONG TERM GOAL #3   Title Patient negotiates ramps & curbs with RW and stairs with single rail modified independent for community access.   Baseline Patient is dependent with negotiating barriers with fall risk.    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 He has weakness of right LE impairing balance & gait. Berg Balance score of 21/56 indicates high fall risk. Timed Up & Go of 46.56sec indicates high fall risk & dependency in ADLs. His gait is unsafe even with use of rolling walker with gait velocity of 0.84 ft/sec indicates fall risk. He needs skilled care to improve balance & gait with decreased fall risk.    Rehab Potential Good   PT Frequency 1x / week   PT Duration 3 weeks   PT Treatment/Interventions ADLs/Self Care Home Management;DME Instruction;Gait training;Stair training;Functional mobility training;Therapeutic activities;Therapeutic exercise;Balance training;Neuromuscular re-education;Patient/family education   PT Next Visit Plan Instruct in HEP for balance & strength   Consulted and Agree with Plan of Care Patient;Family member/caregiver   Family Member Consulted mother      Patient will benefit from skilled therapeutic intervention in order to improve the following deficits and impairments:  Abnormal gait, Decreased activity tolerance, Decreased balance, Decreased endurance, Decreased strength, Postural dysfunction, Pain  Visit Diagnosis: Muscle weakness (generalized)  Other abnormalities of gait and mobility  Unsteadiness on feet     Problem List Patient Active Problem List   Diagnosis Date Noted  . PTSD (post-traumatic stress disorder)   . Hypothyroidism   . History of adrenal insufficiency   . H/O diabetes insipidus   . Schizoaffective disorder (HCC) 11/08/2011  . Personality disorder   . Anemia   . Ascites   . Liver disease   . Hepatitis C   . Kidney disease   . GERD (gastroesophageal reflux disease)   . Psychosis 11/07/2011    Vladimir Faster PT, DPT 08/22/2015, 12:55 PM  [facility] [address] [place], [place], [address] Phone: [phone]   Fax:  [phone]  Name: Yakir Wenke MRN: [id number] Date of Birth: Aug 10, 1960

## 2015-09-10 ENCOUNTER — Ambulatory Visit: Payer: Medicaid Other | Attending: Internal Medicine | Admitting: Physical Therapy

## 2015-09-10 ENCOUNTER — Encounter: Payer: Self-pay | Admitting: Physical Therapy

## 2015-09-10 DIAGNOSIS — M6281 Muscle weakness (generalized): Secondary | ICD-10-CM | POA: Insufficient documentation

## 2015-09-10 DIAGNOSIS — R2689 Other abnormalities of gait and mobility: Secondary | ICD-10-CM | POA: Insufficient documentation

## 2015-09-10 DIAGNOSIS — R2681 Unsteadiness on feet: Secondary | ICD-10-CM | POA: Diagnosis present

## 2015-09-10 NOTE — Patient Instructions (Addendum)
EXTENSION: Standing - Stable (Active)    Stand with right knee bent. Straighten fully, moving foot forward. Alternate kicking forward Complete _ __10_ repetitions.  http://gtsc.exer.us/278   Copyright  VHI. All rights reserved.  EXTENSION: Standing (Active)    Stand, both feet flat. Draw right leg behind body as far as possible. Alternate kicking backwards. Complete  _10__ repetitions.   http://gtsc.exer.us/76

## 2015-09-13 NOTE — Therapy (Signed)
[facility] [address] [place], [place], [address] Phone: [phone]   Fax:  [phone]  Physical Therapy Treatment  Patient Details  Name: [name] MRN: [id number] Date of Birth: 1960/06/30 Referring Provider: Willey Blade, MD  Encounter Date: 09/10/2015    Past Medical History  Diagnosis Date  . Kidney disease   . GERD (gastroesophageal reflux disease)   . Liver disease   . Hepatitis C   . Ascites   . Anemia   . PTSD (post-traumatic stress disorder)   . Personality disorder   . History of ARDS   . Hx of Clostridium difficile infection   . Schizoaffective disorder (HCC)   . PTSD (post-traumatic stress disorder)     SECONDARY TO WAR IN [place]  . Overdose 12/2009  . Peripheral neuropathy (HCC)   . Aspiration pneumonia (HCC)     HX OF  . Pleural effusion, bilateral     HX OF  . History of blood transfusion   . History of adrenal insufficiency   . H/O diabetes insipidus   . Hypothyroidism     Past Surgical History  Procedure Laterality Date  . Right hip surgery  1996  . US guided left thoracentesis  01/21/2010    There were no vitals filed for this visit.     09/10/15 1235  Symptoms/Limitations  Subjective He deniies falls.  He has been walking with walker.  HE does use a cane on stairs between floors along with rail.   Patient is accompained by: Family member  Pertinent History Chronic kidney disease (stage III), cirrhosis of liver, depression, GERD, Hep C, HTN, Schizophrenia (Paranoid Type), PTSD war in [place], peripheral Neuropathy, right hip fx with sg 1996  Limitations Lifting;Standing;Walking;House hold activities  Patient Stated Goals to strength right side.   Pain Assessment  Currently in Pain? Yes  Pain Score 9  Pain Location Hip  Pain Orientation Right  Pain Descriptors / Indicators Aching  Pain Type Chronic pain  Pain Onset More than a month ago  Pain Frequency Constant    Aggravating Factors  weather changes, knee hurts more when walks further.  Pain Relieving Factors sit in good position         09/10/15 1302  OTAGO PROGRAM  Head Movements 5 reps  Neck Movements 5 reps  Back Extension 5 reps;Standing (RW)  Trunk Movements Standing;5 reps (RW)  Ankle Movements 10 reps;Sitting  Knee Extensor 10 reps  Knee Flexor 10 reps (RW)  Hip ABductor 10 reps (RW)  Ankle Plantorflexors Level C  Ankle Dorsiflexors Level C  Sideways Walking Level B    EXTENSION: Standing - Stable (Active)    Stand with right knee bent. Straighten fully, moving foot forward. Alternate kicking forward Complete _ __10_ repetitions.  http://gtsc.exer.us/278   Copyright  VHI. All rights reserved.  EXTENSION: Standing (Active)    Stand, both feet flat. Draw right leg behind body as far as possible. Alternate kicking backwards. Complete _10__ repetitions.   http://gtsc.exer.us/76            09/10/15 1800  PT Visits / Re-Eval  Visit Number 2  Number of Visits 9  Authorization  Authorization Type Medicaid  Authorization Time Period 09/09/2015 - 12/29/2015  Authorization - Visit Number 1  Authorization - Number of Visits 8  PT Time Calculation  PT Start Time 1232  PT Stop Time 1320  PT Time Calculation (min) 48 min  PT - End of Session  Equipment Utilized During Treatment  Gait belt  Activity Tolerance Patient tolerated treatment well  Behavior During Therapy [facility] for tasks assessed/performed             09/10/15 1800  Plan  Clinical Impression Statement He appears to understand initial HEP for balance. Patient would benefit from longer time period of skilled PT care to enable higher level of goals & function. See updated STGs & LTGs.  Pt will benefit from skilled therapeutic intervention in order to improve on the following deficits Abnormal gait;Decreased activity tolerance;Decreased balance;Decreased endurance;Decreased strength;Postural  dysfunction;Pain  Rehab Potential Good  PT Frequency 1x / week  PT Duration 8 weeks  PT Treatment/Interventions ADLs/Self Care Home Management;DME Instruction;Gait training;Stair training;Functional mobility training;Therapeutic activities;Therapeutic exercise;Balance training;Neuromuscular re-education;Patient/family education  PT Next Visit Plan Instruct in HEP for balance & strength  Consulted and Agree with Plan of Care Patient;Family member/caregiver  Family Member Consulted mother               PT Short Term Goals - 09/10/15 1800    PT SHORT TERM GOAL #1   Title Patient demonstrates understanding of initial HEP.  (Target Date: 4th visit after evaluation)   Status New   PT SHORT TERM GOAL #2   Title Berg Balance >30/56 indicating lower fall risk.  (Target Date: 4th visit after evaluation)   Baseline Berg Balance 21/56   Status New   PT SHORT TERM GOAL #3   Title Patient ambulates 300' with LRAD with supervision.  (Target Date: 4th visit after evaluation)   Status New   PT SHORT TERM GOAL #4   Title Patient negotiates ramps & curbs with LRAD and stairs with 1 rail & cane with supervision.  (Target Date: 4th visit after evaluation)   Status New           PT Long Term Goals - 09/10/15 1800    PT LONG TERM GOAL #1   Title Patient demonstrates understanding of HEP to improve strength, flexibility, balance & endurance. (Target Date: 8th visit after evaluation)   Baseline Patient is unknowledgeable in appropriate exercises to address medical condition.    Time 2   Period Months   Status New   PT LONG TERM GOAL #2   Title Patient ambulates 500' with LRAD modified independent to enable basic community mobility. (Target Date:8th visit after evaluation)   Baseline Patient ambulates 53' with RW with supervision. (Target Date: 3rd visit after evaluation)   Time 2   Period Months   Status New   PT LONG TERM GOAL #3   Title Patient negotiates ramps & curbs with LRAD and  stairs with single rail modified independent for community access.  (Target Date:8th visit after evaluation)   Baseline Patient is dependent with negotiating barriers with fall risk.    Time 2   Period Months   Status New   PT LONG TERM GOAL #4   Title Berg Balance Test >/= 45/56 to indicate lower fall risk.  (Target Date:8th visit after evaluation)   Baseline Berg Balance 21/56   Time 2   Period Months   Status New   PT LONG TERM GOAL #5   Title Timed Up & Go <30 sec  (Target Date:8th visit after evaluation)   Baseline TUG 46.56sec with RW   Time 2   Period Months   Status New             Patient will benefit from skilled therapeutic intervention in order to improve the following deficits and impairments:  Abnormal gait, Decreased activity tolerance, Decreased balance, Decreased endurance, Decreased strength, Postural dysfunction, Pain  Visit Diagnosis: Muscle weakness (generalized)  Other abnormalities of gait and mobility  Unsteadiness on feet     Problem List Patient Active Problem List   Diagnosis Date Noted  . PTSD (post-traumatic stress disorder)   . Hypothyroidism   . History of adrenal insufficiency   . H/O diabetes insipidus   . Schizoaffective disorder (HCC) 11/08/2011  . Personality disorder   . Anemia   . Ascites   . Liver disease   . Hepatitis C   . Kidney disease   . GERD (gastroesophageal reflux disease)   . Psychosis 11/07/2011    Vladimir Faster PT, DPT 09/11/2015, 17:00  [facility] [address] [place], [place], [address] Phone: [phone]   Fax:  [phone]  Name: [name] MRN: [id number] Date of Birth: [birth year]

## 2015-09-13 NOTE — Addendum Note (Signed)
Addended by: Fraser Din on: 09/13/2015 10:08 AM   Modules accepted: Orders

## 2015-09-15 ENCOUNTER — Ambulatory Visit: Payer: Medicaid Other | Admitting: Physical Therapy

## 2015-09-15 ENCOUNTER — Encounter: Payer: Self-pay | Admitting: Physical Therapy

## 2015-09-15 DIAGNOSIS — R2681 Unsteadiness on feet: Secondary | ICD-10-CM

## 2015-09-15 DIAGNOSIS — R2689 Other abnormalities of gait and mobility: Secondary | ICD-10-CM

## 2015-09-15 DIAGNOSIS — M6281 Muscle weakness (generalized): Secondary | ICD-10-CM | POA: Diagnosis not present

## 2015-09-17 ENCOUNTER — Ambulatory Visit: Payer: Self-pay | Admitting: Physical Therapy

## 2015-09-17 NOTE — Therapy (Signed)
[facility] [address] [place], [place], [address] Phone: [phone]   Fax:  [phone]  Physical Therapy Treatment  Patient Details  Name: [name] MRN: [id number] Date of Birth: 18-Nov-1960 Referring Provider: Willey Blade, MD  Encounter Date: 09/15/2015   09/15/15 1506  PT Visits / Re-Eval  Visit Number 3  Number of Visits 9  Authorization  Authorization Type Medicaid  Authorization Time Period 09/09/2015 - 12/29/2015  Authorization - Visit Number 2  Authorization - Number of Visits 8  PT Time Calculation  PT Start Time 1450  PT Stop Time 1530  PT Time Calculation (min) 40 min  PT - End of Session  Equipment Utilized During Treatment Gait belt  Activity Tolerance Patient tolerated treatment well  Behavior During Therapy [facility] for tasks assessed/performed      Past Medical History  Diagnosis Date  . Kidney disease   . GERD (gastroesophageal reflux disease)   . Liver disease   . Hepatitis C   . Ascites   . Anemia   . PTSD (post-traumatic stress disorder)   . Personality disorder   . History of ARDS   . Hx of Clostridium difficile infection   . Schizoaffective disorder (HCC)   . PTSD (post-traumatic stress disorder)     SECONDARY TO WAR IN [place]  . Overdose 12/2009  . Peripheral neuropathy (HCC)   . Aspiration pneumonia (HCC)     HX OF  . Pleural effusion, bilateral     HX OF  . History of blood transfusion   . History of adrenal insufficiency   . H/O diabetes insipidus   . Hypothyroidism     Past Surgical History  Procedure Laterality Date  . Right hip surgery  1996  . US guided left thoracentesis  01/21/2010    There were no vitals filed for this visit.     09/15/15 1501  Symptoms/Limitations  Subjective Reports no falls. Has been doing the HEP, however has trouble with balance activities as he had nothing stable to use except RW. See's Dr Dion Saucier for hip/knee pain on this Wednesday.   Patient is accompained by: Family member  Pertinent History Chronic kidney disease (stage III), cirrhosis of liver, depression, GERD, Hep C, HTN, Schizophrenia (Paranoid Type), PTSD war in [place], peripheral Neuropathy, right hip fx with sg 1996  Limitations Lifting;Standing;Walking;House hold activities  Patient Stated Goals to strength right side.   Pain Assessment  Currently in Pain? Yes  Pain Score 10  Pain Location Hip  Pain Orientation Right  Pain Descriptors / Indicators Aching;Stabbing  Pain Type Chronic pain  Pain Radiating Towards toward knee  Pain Onset More than a month ago  Pain Frequency Constant  Aggravating Factors  weather changes, increased activity  Pain Relieving Factors sitting, good weather      09/15/15 1521  OTAGO PROGRAM  Back Extension Standing;5 reps  Trunk Movements Sitting;5 reps  Ankle Movements Sitting;10 reps  Knee Extensor 10 reps  Knee Flexor 10 reps  Hip ABductor 10 reps  Ankle Plantorflexors Level C (x10 each foot only)  Ankle Dorsiflexors Level C (10 reps only, with RW support)  Knee Bends Level A  Backwards Walking Level B (5 small steps with RW x 2 laps)  Sideways Walking Level B (with RW, cues for small steps)          PT Short Term Goals - 09/10/15 1800    PT SHORT TERM GOAL #1   Title Patient demonstrates understanding of initial  HEP.  (Target Date: 4th visit after evaluation)   Status New   PT SHORT TERM GOAL #2   Title Berg Balance >30/56 indicating lower fall risk.  (Target Date: 4th visit after evaluation)   Baseline Berg Balance 21/56   Status New   PT SHORT TERM GOAL #3   Title Patient ambulates 300' with LRAD with supervision.  (Target Date: 4th visit after evaluation)   Status New   PT SHORT TERM GOAL #4   Title Patient negotiates ramps & curbs with LRAD and stairs with 1 rail & cane with supervision.  (Target Date: 4th visit after evaluation)   Status New           PT Long Term Goals - 09/10/15 1800     PT LONG TERM GOAL #1   Title Patient demonstrates understanding of HEP to improve strength, flexibility, balance & endurance. (Target Date: 8th visit after evaluation)   Baseline Patient is unknowledgeable in appropriate exercises to address medical condition.    Time 2   Period Months   Status New   PT LONG TERM GOAL #2   Title Patient ambulates 500' with LRAD modified independent to enable basic community mobility. (Target Date:8th visit after evaluation)   Baseline Patient ambulates 42' with RW with supervision. (Target Date: 3rd visit after evaluation)   Time 2   Period Months   Status New   PT LONG TERM GOAL #3   Title Patient negotiates ramps & curbs with LRAD and stairs with single rail modified independent for community access.  (Target Date:8th visit after evaluation)   Baseline Patient is dependent with negotiating barriers with fall risk.    Time 2   Period Months   Status New   PT LONG TERM GOAL #4   Title Berg Balance Test >/= 45/56 to indicate lower fall risk.  (Target Date:8th visit after evaluation)   Baseline Berg Balance 21/56   Time 2   Period Months   Status New   PT LONG TERM GOAL #5   Title Timed Up & Go <30 sec  (Target Date:8th visit after evaluation)   Baseline TUG 46.56sec with RW   Time 2   Period Months   Status New        09/15/15 1506  Plan  Clinical Impression Statement Session focused on review of exercises issued to date and safety using RW to perform them. Discussed have walker braced against wall or solid object so that it will not roll or tip over. Pt is adamant he has no where to do this and that the walker "is fine, it will not roll". Added a few new OTAGO exercises today without any issues noted.                   Pt will benefit from skilled therapeutic intervention in order to improve on the following deficits Abnormal gait;Decreased activity tolerance;Decreased balance;Decreased endurance;Decreased strength;Postural dysfunction;Pain   Rehab Potential Good  PT Frequency 1x / week  PT Duration 8 weeks  PT Treatment/Interventions ADLs/Self Care Home Management;DME Instruction;Gait training;Stair training;Functional mobility training;Therapeutic activities;Therapeutic exercise;Balance training;Neuromuscular re-education;Patient/family education  PT Next Visit Plan Instruct in HEP for balance & strength, complete OTAGO as indicated  Consulted and Agree with Plan of Care Patient;Family member/caregiver  Family Member Consulted mother     Patient will benefit from skilled therapeutic intervention in order to improve the following deficits and impairments:  Abnormal gait, Decreased activity tolerance, Decreased balance, Decreased endurance, Decreased strength, Postural  dysfunction, Pain  Visit Diagnosis: Muscle weakness (generalized)  Other abnormalities of gait and mobility  Unsteadiness on feet     Problem List Patient Active Problem List   Diagnosis Date Noted  . PTSD (post-traumatic stress disorder)   . Hypothyroidism   . History of adrenal insufficiency   . H/O diabetes insipidus   . Schizoaffective disorder (HCC) 11/08/2011  . Personality disorder   . Anemia   . Ascites   . Liver disease   . Hepatitis C   . Kidney disease   . GERD (gastroesophageal reflux disease)   . Psychosis 11/07/2011    Sallyanne Kuster, PTA, [facility] [address], [address] [place], [place] [address] [phone] 09/17/2015, 12:26 PM   Name: [name] MRN: [id number] Date of Birth: [birth year]

## 2015-09-18 ENCOUNTER — Emergency Department (HOSPITAL_COMMUNITY)
Admission: EM | Admit: 2015-09-18 | Discharge: 2015-09-18 | Disposition: A | Discharge status: Home / Self Care | Payer: Medicaid Other | Attending: Emergency Medicine | Admitting: Emergency Medicine

## 2015-09-18 ENCOUNTER — Encounter (HOSPITAL_COMMUNITY): Payer: Self-pay

## 2015-09-18 DIAGNOSIS — F17210 Nicotine dependence, cigarettes, uncomplicated: Secondary | ICD-10-CM | POA: Diagnosis not present

## 2015-09-18 DIAGNOSIS — L299 Pruritus, unspecified: Secondary | ICD-10-CM | POA: Insufficient documentation

## 2015-09-18 DIAGNOSIS — T380X5A Adverse effect of glucocorticoids and synthetic analogues, initial encounter: Secondary | ICD-10-CM | POA: Diagnosis not present

## 2015-09-18 DIAGNOSIS — F259 Schizoaffective disorder, unspecified: Secondary | ICD-10-CM | POA: Insufficient documentation

## 2015-09-18 DIAGNOSIS — E039 Hypothyroidism, unspecified: Secondary | ICD-10-CM | POA: Insufficient documentation

## 2015-09-18 DIAGNOSIS — Z79899 Other long term (current) drug therapy: Secondary | ICD-10-CM | POA: Insufficient documentation

## 2015-09-18 DIAGNOSIS — Y829 Unspecified medical devices associated with adverse incidents: Secondary | ICD-10-CM | POA: Insufficient documentation

## 2015-09-18 DIAGNOSIS — Z789 Other specified health status: Secondary | ICD-10-CM

## 2015-09-18 MED ORDER — GI COCKTAIL ~~LOC~~
30.0000 mL | Freq: Once | ORAL | Status: AC
  Administered 2015-09-18: 30 mL via ORAL
  Filled 2015-09-18: qty 30

## 2015-09-18 NOTE — ED Notes (Signed)
Pt started taking two new medications today and started having itching on his hands, nausea, vomiting, headache, and dizziness

## 2015-09-18 NOTE — Discharge Instructions (Signed)
Discontinue diclofenac and your Medrol Dosepak until you are able to talk with your orthopedic doctor about further treatment. Given your history of reflux, it is likely that these medications (especially in combination) aggravated your heartburn. Continue taking your daily prescribed medications. Return to the emergency department as needed for worsening symptoms.  Gastritis, Adult Gastritis is soreness and puffiness (inflammation) of the lining of the stomach. If you do not get help, gastritis can cause bleeding and sores (ulcers) in the stomach. HOME CARE   Only take medicine as told by your doctor.  If you were given antibiotic medicines, take them as told. Finish the medicines even if you start to feel better.  Drink enough fluids to keep your pee (urine) clear or pale yellow.  Avoid foods and drinks that make your problems worse. Foods you may want to avoid include:  Caffeine or alcohol.  Chocolate.  Mint.  Garlic and onions.  Spicy foods.  Citrus fruits, including oranges, lemons, or limes.  Food containing tomatoes, including sauce, chili, salsa, and pizza.  Fried and fatty foods.  Eat small meals throughout the day instead of large meals. GET HELP RIGHT AWAY IF:   You have black or dark red poop (stools).  You throw up (vomit) blood. It may look like coffee grounds.  You cannot keep fluids down.  Your belly (abdominal) pain gets worse.  You have a fever.  You do not feel better after 1 week.  You have any other questions or concerns. MAKE SURE YOU:   Understand these instructions.  Will watch your condition.  Will get help right away if you are not doing well or get worse.   This information is not intended to replace advice given to you by your health care provider. Make sure you discuss any questions you have with your health care provider.   Document Released: 09/01/2007 Document Revised: 06/07/2011 Document Reviewed: 04/28/2011 Elsevier  Interactive Patient Education [employer]

## 2015-09-19 NOTE — ED Provider Notes (Signed)
CSN: [id number]     Arrival date & time 09/18/15  0228 History   First MD Initiated Contact with Patient 09/18/15 0404     Chief Complaint  Patient presents with  . Medication Reaction     (Consider location/radiation/quality/duration/timing/severity/associated sxs/prior Treatment) Patient is a 55 y.o. male presenting with allergic reaction. The history is provided by the patient. No language interpreter was used.  Allergic Reaction Presenting symptoms: itching   Presenting symptoms: no difficulty breathing and no difficulty swallowing   Presenting symptoms comment:  As well as nausea, vomiting, and lightheadedness Severity:  Moderate Context: medications (started on oral steroid taper and NSAIDs)   Relieved by:  Rest Ineffective treatments: Antacid.   Past Medical History  Diagnosis Date  . Kidney disease   . GERD (gastroesophageal reflux disease)   . Liver disease   . Hepatitis C   . Ascites   . Anemia   . PTSD (post-traumatic stress disorder)   . Personality disorder   . History of ARDS   . Hx of Clostridium difficile infection   . Schizoaffective disorder (HCC)   . PTSD (post-traumatic stress disorder)     SECONDARY TO WAR IN [place]  . Overdose 12/2009  . Peripheral neuropathy (HCC)   . Aspiration pneumonia (HCC)     HX OF  . Pleural effusion, bilateral     HX OF  . History of blood transfusion   . History of adrenal insufficiency   . H/O diabetes insipidus   . Hypothyroidism    Past Surgical History  Procedure Laterality Date  . Right hip surgery  1996  . US guided left thoracentesis  01/21/2010   History reviewed. No pertinent family history. Social History  Substance Use Topics  . Smoking status: Current Every Day Smoker -- 1.00 packs/day for 25 years    Types: Cigarettes  . Smokeless tobacco: Never Used  . Alcohol Use: No     Comment: Pt denies     Review of Systems  Constitutional: Negative for fever.  HENT: Negative for trouble swallowing.    Respiratory: Negative for shortness of breath.   Gastrointestinal: Positive for nausea and vomiting.  Skin: Positive for itching.  Neurological: Positive for light-headedness and headaches. Negative for syncope.  Ten systems reviewed and are negative for acute change, except as noted in the HPI.    Allergies  Heparin; Imipramine; Lamictal; and Penicillins  Home Medications   Prior to Admission medications   Medication Sig Start Date End Date Taking? Authorizing Provider  b complex vitamins capsule Take 1 capsule by mouth daily.   Yes Historical Provider, MD  benztropine (COGENTIN) 1 MG tablet Take 1 mg by mouth 2 (two) times daily as needed for tremors.    Yes Historical Provider, MD  Cholecalciferol (VITAMIN D-3) 1000 UNITS CAPS Take 2,000 Units by mouth daily.   Yes Historical Provider, MD  diclofenac (VOLTAREN) 75 MG EC tablet Take 75 mg by mouth 2 (two) times daily.   Yes Historical Provider, MD  diphenhydrAMINE (SOMINEX) 25 MG tablet Take 25 mg by mouth at bedtime.   Yes Historical Provider, MD  furosemide (LASIX) 20 MG tablet Take 20 mg by mouth 2 (two) times daily.   Yes Historical Provider, MD  gabapentin (NEURONTIN) 100 MG capsule Take 200 mg by mouth 3 (three) times daily.   Yes Historical Provider, MD  haloperidol lactate (HALDOL) 5 MG/ML injection every 30 (thirty) days.   Yes Historical Provider, MD  LACTOBACILLUS PO Take 1 capsule by  mouth daily.   Yes Historical Provider, MD  meloxicam (MOBIC) 15 MG tablet Take 1 tablet by mouth daily as needed. pain 12/11/14  Yes Historical Provider, MD  methylPREDNISolone (MEDROL DOSEPAK) 4 MG TBPK tablet Take by mouth as directed.   Yes Historical Provider, MD  omeprazole (PRILOSEC) 20 MG capsule Take 20 mg by mouth daily.   Yes Historical Provider, MD  rifaximin (XIFAXAN) 550 MG TABS tablet Take 550 mg by mouth 2 (two) times daily.   Yes Historical Provider, MD  spironolactone (ALDACTONE) 50 MG tablet Take 50 mg by mouth daily.   Yes  Historical Provider, MD  HYDROcodone-acetaminophen (NORCO/VICODIN) 5-325 MG per tablet Take 1 tablet by mouth every 6 (six) hours as needed for moderate pain. Patient not taking: Reported on 09/18/2015 12/18/14   Elwin Mocha, MD  oxyCODONE-acetaminophen (PERCOCET) 7.5-325 MG tablet Take 1 tablet by mouth every 6 (six) hours as needed for severe pain. Patient not taking: Reported on 09/18/2015 04/17/15   Melton Krebs, PA-C   BP 113/64 mmHg  Pulse 68  Temp(Src) 97.9 F (36.6 C) (Oral)  Resp 16  SpO2 93%   Physical Exam  Constitutional: He is oriented to person, place, and time. He appears well-developed and well-nourished. No distress.  Nontoxic appearing and in no distress.  HENT:  Head: Normocephalic and atraumatic.  Eyes: Conjunctivae and EOM are normal. No scleral icterus.  Neck: Normal range of motion.  Cardiovascular: Normal rate, regular rhythm and intact distal pulses.   Pulmonary/Chest: Effort normal. No respiratory distress. He has no wheezes.  Respirations even and unlabored.  Musculoskeletal: Normal range of motion.  Neurological: He is alert and oriented to person, place, and time. He exhibits normal muscle tone. Coordination normal.  Skin: Skin is warm and dry. No rash noted. He is not diaphoretic. No erythema. No pallor.  Psychiatric: He has a normal mood and affect. His behavior is normal.  Nursing note and vitals reviewed.   ED Course  Procedures (including critical care time) Labs Review Labs Reviewed - No data to display  Imaging Review No results found. I have personally reviewed and evaluated these images and lab results as part of my medical decision-making.   EKG Interpretation None      0550  Patient reassessed and reports improved nausea. No c/o burning epigastric pain. Suspect symptoms to be secondary to reflux, perpetuated by combination of NSAID and steroids.  MDM   Final diagnoses:  Medication intolerance    55 year old male  presents to the emergency department for further evaluation of nausea and vomiting with headache and lightheadedness which began after starting oral NSAIDs and a steroid taper prescribed to him by his orthopedist. Patient reports a history of esophageal reflux. He states that he has had difficulty tolerating NSAIDs in the past secondary to nausea and vomiting. He states that he had some itching as well, but this resolved. He presented this evening for persistent burning epigastric pain which resolved completely after being given a GI cocktail. He has had no facial swelling or evidence of respiratory distress or difficulty. No inability to swallow or tripoding.   Symptoms consistent with medication intolerance. I have recommended that he discontinue NSAIDs and steroid taper until further discussion with his orthopedic physician. No indication for further emergent workup at this time. Patient discharged in satisfactory condition with no unaddressed concerns.    Antony Madura, PA-C 09/25/15 8242  Derwood Kaplan, MD 09/25/15 1510

## 2015-09-24 ENCOUNTER — Encounter: Payer: Self-pay | Admitting: Physical Therapy

## 2015-09-24 ENCOUNTER — Ambulatory Visit: Payer: Medicaid Other | Admitting: Physical Therapy

## 2015-09-24 DIAGNOSIS — M6281 Muscle weakness (generalized): Secondary | ICD-10-CM

## 2015-09-24 DIAGNOSIS — R2689 Other abnormalities of gait and mobility: Secondary | ICD-10-CM

## 2015-09-24 DIAGNOSIS — R2681 Unsteadiness on feet: Secondary | ICD-10-CM

## 2015-09-25 NOTE — Therapy (Signed)
[facility] [address] [place], [place], [address] Phone: [phone]   Fax:  [phone]  Physical Therapy Treatment  Patient Details  Name: [name] MRN: [id number] Date of Birth: 1960/09/04 Referring Provider: Willey Blade, MD  Encounter Date: 09/24/2015      PT End of Session - 09/24/15 1233    Visit Number 4   Number of Visits 9   Authorization Type Medicaid   Authorization Time Period 09/09/2015 - 12/29/2015   Authorization - Visit Number 3   Authorization - Number of Visits 8   PT Start Time 1233   PT Stop Time 1320   PT Time Calculation (min) 47 min   Equipment Utilized During Treatment Gait belt   Activity Tolerance Patient tolerated treatment well   Behavior During Therapy [facility] for tasks assessed/performed      Past Medical History  Diagnosis Date  . Kidney disease   . GERD (gastroesophageal reflux disease)   . Liver disease   . Hepatitis C   . Ascites   . Anemia   . PTSD (post-traumatic stress disorder)   . Personality disorder   . History of ARDS   . Hx of Clostridium difficile infection   . Schizoaffective disorder (HCC)   . PTSD (post-traumatic stress disorder)     SECONDARY TO WAR IN [place]  . Overdose 12/2009  . Peripheral neuropathy (HCC)   . Aspiration pneumonia (HCC)     HX OF  . Pleural effusion, bilateral     HX OF  . History of blood transfusion   . History of adrenal insufficiency   . H/O diabetes insipidus   . Hypothyroidism     Past Surgical History  Procedure Laterality Date  . Right hip surgery  1996  . US guided left thoracentesis  01/21/2010    There were no vitals filed for this visit.      Subjective Assessment - 09/24/15 1234    Subjective He went to ED due to medication reaction. They switched medicines with still pain issues.    Patient is accompained by: Family member   Pertinent History Chronic kidney disease (stage III), cirrhosis of liver, depression,  GERD, Hep C, HTN, Schizophrenia (Paranoid Type), PTSD war in [place], peripheral Neuropathy, right hip fx with sg 1996   Limitations Lifting;Standing;Walking;House hold activities   Patient Stated Goals to strength right side.    Currently in Pain? Yes   Pain Score 8    Pain Location Knee   Pain Orientation Right   Pain Descriptors / Indicators Sharp   Pain Type Chronic pain   Pain Onset More than a month ago   Pain Frequency Constant   Aggravating Factors  walking without walker, walking too far   Pain Relieving Factors sitting to rest, medications help a little.   Effect of Pain on Daily Activities limits walking   Multiple Pain Sites No     Right knee pain in joint area with edema anteriorly at patella tendon. With PT supporting femur, patient reports minimal pain with active knee extension and with mild resistance MMT. Patient is over 6'6" and seated in chair has only ~50% of femur supported, active knee extension with significant pain 8/10. Seated active knee flexion resistance with moderate pain.  Patient has history of fractures & trauma to knee in war prior to coming to [place].  Balance Exercises - 09/24/15 1230    OTAGO PROGRAM   Head Movements 5 reps   Neck Movements 5 reps  cues on decrease amount of cervical extension   Back Extension 5 reps;Standing  posterior pelvis to counter top for support & RW anterior   Trunk Movements Standing;5 reps  posterior pelvis to counter top for support & RW anterior   Ankle Movements Sitting;10 reps   Knee Extensor 10 reps  cues on support of femur to minimize stress on knee   Knee Flexor 10 reps  standing with RW   Hip ABductor 10 reps  standing with RW cues on posture   Ankle Plantorflexors 20 reps, support  RW support   Ankle Dorsiflexors 20 reps, support  standing with RW   Knee Bends 10 reps, support  standing with RW with chair behind for safety   Backwards Walking Support  RW  support   Walking and Turning Around Assistive device  RW support   Sideways Walking Assistive device  counter top support           PT Education - 09/24/15 1420    Education provided Yes   Education Details benefits of supportive shoes and connection between foot support & knee motions   Person(s) Educated Patient;Parent(s)   Methods Explanation;Verbal cues   Comprehension Verbalized understanding          PT Short Term Goals - 09/10/15 1800    PT SHORT TERM GOAL #1   Title Patient demonstrates understanding of initial HEP.  (Target Date: 4th visit after evaluation)   Status New   PT SHORT TERM GOAL #2   Title Berg Balance >30/56 indicating lower fall risk.  (Target Date: 4th visit after evaluation)   Baseline Berg Balance 21/56   Status New   PT SHORT TERM GOAL #3   Title Patient ambulates 300' with LRAD with supervision.  (Target Date: 4th visit after evaluation)   Status New   PT SHORT TERM GOAL #4   Title Patient negotiates ramps & curbs with LRAD and stairs with 1 rail & cane with supervision.  (Target Date: 4th visit after evaluation)   Status New           PT Long Term Goals - 09/10/15 1800    PT LONG TERM GOAL #1   Title Patient demonstrates understanding of HEP to improve strength, flexibility, balance & endurance. (Target Date: 8th visit after evaluation)   Baseline Patient is unknowledgeable in appropriate exercises to address medical condition.    Time 2   Period Months   Status New   PT LONG TERM GOAL #2   Title Patient ambulates 500' with LRAD modified independent to enable basic community mobility. (Target Date:8th visit after evaluation)   Baseline Patient ambulates 74' with RW with supervision. (Target Date: 3rd visit after evaluation)   Time 2   Period Months   Status New   PT LONG TERM GOAL #3   Title Patient negotiates ramps & curbs with LRAD and stairs with single rail modified independent for community access.  (Target Date:8th visit  after evaluation)   Baseline Patient is dependent with negotiating barriers with fall risk.    Time 2   Period Months   Status New   PT LONG TERM GOAL #4   Title Berg Balance Test >/= 45/56 to indicate lower fall risk.  (Target Date:8th visit after evaluation)   Baseline Berg Balance 21/56   Time 2   Period Months  Status New   PT LONG TERM GOAL #5   Title Timed Up & Go <30 sec  (Target Date:8th visit after evaluation)   Baseline TUG 46.56sec with RW   Time 2   Period Months   Status New               Plan - 09/24/15 1418    Clinical Impression Statement Patient reports knee pain increased over last few days and new medicatoin does not seem to help Patient had minimal pain with supporte isolated movements but increased with femur not stablized.    Rehab Potential Good   PT Frequency 1x / week   PT Duration 8 weeks   PT Treatment/Interventions ADLs/Self Care Home Management;DME Instruction;Gait training;Stair training;Functional mobility training;Therapeutic activities;Therapeutic exercise;Balance training;Neuromuscular re-education;Patient/family education   PT Next Visit Plan Instruct in HEP for balance & strength, complete OTAGO, assess STGs   Consulted and Agree with Plan of Care Patient;Family member/caregiver   Family Member Consulted mother      Patient will benefit from skilled therapeutic intervention in order to improve the following deficits and impairments:  Abnormal gait, Decreased activity tolerance, Decreased balance, Decreased endurance, Decreased strength, Postural dysfunction, Pain  Visit Diagnosis: Muscle weakness (generalized)  Other abnormalities of gait and mobility  Unsteadiness on feet     Problem List Patient Active Problem List   Diagnosis Date Noted  . PTSD (post-traumatic stress disorder)   . Hypothyroidism   . History of adrenal insufficiency   . H/O diabetes insipidus   . Schizoaffective disorder (HCC) 11/08/2011  . Personality  disorder   . Anemia   . Ascites   . Liver disease   . Hepatitis C   . Kidney disease   . GERD (gastroesophageal reflux disease)   . Psychosis 11/07/2011    Vladimir Faster  PT, DPT  09/25/2015, 2:22 PM  [facility] [address] [place], [place], [address] Phone: [phone]   Fax:  [phone]  Name: Edvin Albus MRN: [id number] Date of Birth: [birth year]

## 2015-10-01 ENCOUNTER — Ambulatory Visit: Payer: Medicaid Other | Attending: Internal Medicine | Admitting: Physical Therapy

## 2015-10-01 ENCOUNTER — Encounter: Payer: Self-pay | Admitting: Physical Therapy

## 2015-10-01 ENCOUNTER — Ambulatory Visit: Payer: Self-pay | Admitting: Physical Therapy

## 2015-10-01 DIAGNOSIS — R2681 Unsteadiness on feet: Secondary | ICD-10-CM | POA: Diagnosis present

## 2015-10-01 DIAGNOSIS — R2689 Other abnormalities of gait and mobility: Secondary | ICD-10-CM

## 2015-10-01 DIAGNOSIS — M6281 Muscle weakness (generalized): Secondary | ICD-10-CM | POA: Diagnosis present

## 2015-10-02 NOTE — Therapy (Signed)
[place] [address] [place], [place], [address] Phone: [phone]   Fax:  [phone]  Physical Therapy Treatment  Patient Details  Name: [name] MRN: [id number] Date of Birth: Jan 29, 1961 Referring Provider: Kevan Ny, MD  Encounter Date: 10/01/2015      PT End of Session - 10/01/15 1700    Visit Number 5   Number of Visits 9   Authorization Type Medicaid   Authorization Time Period 09/09/2015 - 12/29/2015   Authorization - Visit Number 4   Authorization - Number of Visits 8   Equipment Utilized During Treatment Gait belt   Activity Tolerance Patient tolerated treatment well   Behavior During Therapy [facility] for tasks assessed/performed      Past Medical History  Diagnosis Date  . Kidney disease   . GERD (gastroesophageal reflux disease)   . Liver disease   . Hepatitis C   . Ascites   . Anemia   . PTSD (post-traumatic stress disorder)   . Personality disorder   . History of ARDS   . Hx of Clostridium difficile infection   . Schizoaffective disorder ([place])   . PTSD (post-traumatic stress disorder)     SECONDARY TO WAR IN [place]  . Overdose 12/2009  . Peripheral neuropathy ([place])   . Aspiration pneumonia (HCC)     HX OF  . Pleural effusion, bilateral     HX OF  . History of blood transfusion   . History of adrenal insufficiency   . H/O diabetes insipidus   . Hypothyroidism     Past Surgical History  Procedure Laterality Date  . Right hip surgery  1996  . US guided left thoracentesis  01/21/2010    There were no vitals filed for this visit.      Subjective Assessment - 10/01/15 1539    Subjective No falls. He tried more supportive shoes.    Patient is accompained by: Family member   Pertinent History Chronic kidney disease (stage III), cirrhosis of liver, depression, GERD, Hep C, HTN, Schizophrenia (Paranoid Type), PTSD war in [place], peripheral Neuropathy, right hip fx with sg 1996   Limitations Lifting;Standing;Walking;House hold activities   Patient Stated Goals to strength right side.    Currently in Pain? Yes   Pain Score 8    Pain Location Knee   Pain Orientation Right   Pain Descriptors / Indicators Sharp   Pain Type Chronic pain   Pain Onset More than a month ago   Pain Frequency Constant   Aggravating Factors  walking too much   Pain Relieving Factors sitting to rest   Effect of Pain on Daily Activities limits walking.            [facility] PT Assessment - 10/01/15 1608    Transfers   Transfers Sit to Stand;Stand to Sit;Stand Pivot Transfers   Sit to Stand 5: Supervision;With upper extremity assist;With armrests;From chair/3-in-1   Stand to Sit 5: Supervision;With upper extremity assist;With armrests;To chair/3-in-1   Ambulation/Gait   Ambulation/Gait Yes   Ambulation/Gait Assistance 5: Supervision   Ambulation/Gait Assistance Details PT instructed with demo in rollator walker including benefits vs a std RW.    Gait Pattern Step-through pattern;Decreased step length - right;Decreased hip/knee flexion - right;Right steppage;Right foot flat;Right flexed knee in stance;Left flexed knee in stance;Trunk flexed;Wide base of support   Ambulation Surface Indoor;Level   Berg Balance Test   Sit to Stand Able to stand  independently using hands   Standing Unsupported Able  to stand safely 2 minutes   Sitting with Back Unsupported but Feet Supported on Floor or Stool Able to sit safely and securely 2 minutes   Stand to Sit Controls descent by using hands   Transfers Able to transfer safely, definite need of hands   Standing Unsupported with Eyes Closed Able to stand 10 seconds safely   Standing Ubsupported with Feet Together Able to place feet together independently and stand for 1 minute with supervision   From Standing, Reach Forward with Outstretched Arm Can reach forward >12 cm safely (5")   From Standing Position, Pick up Object from Floor Unable to pick up  shoe, but reaches 2-5 cm (1-2") from shoe and balances independently   From Standing Position, Turn to Look Behind Over each Shoulder Turn sideways only but maintains balance   Turn 360 Degrees Needs assistance while turning   Standing Unsupported, Alternately Place Feet on Step/Stool Needs assistance to keep from falling or unable to try   Standing Unsupported, One Foot in Front Needs help to step but can hold 15 seconds   Standing on One Leg Unable to try or needs assist to prevent fall   Total Score 32   Berg comment: Initial was 21/56                     [facility] Adult PT Treatment/Exercise - 10/01/15 1608    Ambulation/Gait   Ambulation Distance (Feet) 250 Feet  250' w/ RW & 160' X2 with rollator   Assistive device Rolling walker;Rollator             Balance Exercises - 10/01/15 1608    OTAGO PROGRAM   Head Movements 5 reps   Neck Movements 5 reps   Back Extension 5 reps  RW support   Trunk Movements 5 reps;Standing  RW support   Ankle Movements Sitting;10 reps   Knee Extensor 10 reps  RLE supported with towel at distal femur   Knee Flexor 10 reps  standing with RW   Hip ABductor 10 reps  standing with RW   Ankle Plantorflexors 20 reps, support  standing with RW   Ankle Dorsiflexors 20 reps, support  standing with RW   Knee Bends 10 reps, support  standing with RW & chair behind for safety   Backwards Walking Support  RW support   Walking and Turning Around Assistive device  RW support   Sideways Walking Assistive device  RW support             PT Short Term Goals - 10/01/15 1611    PT SHORT TERM GOAL #1   Title Patient demonstrates understanding of initial HEP.  (Target Date: 4th visit after evaluation)   Baseline MET 10/01/2015   Status Achieved   PT SHORT TERM GOAL #2   Title Berg Balance >30/56 indicating lower fall risk.  (Target Date: 4th visit after evaluation)   Baseline Berg Balance 32/56   Status Achieved   PT SHORT TERM GOAL  #3   Title Patient ambulates 300' with LRAD with supervision.  (Target Date: 4th visit after evaluation)   Baseline partially MET 10/01/2015 patient ambulates 250' with RW or 10' with rollator walker with supervision.    Status Partially Met   PT SHORT TERM GOAL #4   Title Patient negotiates ramps & curbs with LRAD and stairs with 1 rail & cane with supervision.  (Target Date: 4th visit after evaluation)   Status On-going  PT Long Term Goals - 10/01/15 1700    PT LONG TERM GOAL #1   Title Patient demonstrates understanding of HEP to improve strength, flexibility, balance & endurance. (Target Date: 8th visit after evaluation)   Baseline Patient is unknowledgeable in appropriate exercises to address medical condition.    Time 2   Period Months   Status On-going   PT LONG TERM GOAL #2   Title Patient ambulates 500' with LRAD modified independent to enable basic community mobility. (Target Date:8th visit after evaluation)   Baseline Patient ambulates 66' with RW with supervision. (Target Date: 3rd visit after evaluation)   Time 2   Period Months   Status On-going   PT LONG TERM GOAL #3   Title Patient negotiates ramps & curbs with LRAD and stairs with single rail modified independent for community access.  (Target Date:8th visit after evaluation)   Baseline Patient is dependent with negotiating barriers with fall risk.    Time 2   Period Months   Status On-going   PT LONG TERM GOAL #4   Title Berg Balance Test >/= 45/56 to indicate lower fall risk.  (Target Date:8th visit after evaluation)   Baseline Berg Balance 21/56   Time 2   Period Months   Status On-going   PT LONG TERM GOAL #5   Title Timed Up & Go <30 sec  (Target Date:8th visit after evaluation)   Baseline TUG 46.56sec with RW   Time 2   Period Months   Status On-going               Plan - 10/01/15 1700    Clinical Impression Statement Patient has ~1.5" leg length difference with RLE shorter than  LLE. His right knee is his painful knee and may be part of his pain issue. He met 4of 5 STGs.    Rehab Potential Good   PT Frequency 1x / week   PT Duration 8 weeks   PT Treatment/Interventions ADLs/Self Care Home Management;DME Instruction;Gait training;Stair training;Functional mobility training;Therapeutic activities;Therapeutic exercise;Balance training;Neuromuscular re-education;Patient/family education   PT Next Visit Plan Instruct in HEP for balance & strength, complete OTAGO   Consulted and Agree with Plan of Care Patient;Family member/caregiver   Family Member Consulted mother      Patient will benefit from skilled therapeutic intervention in order to improve the following deficits and impairments:  Abnormal gait, Decreased activity tolerance, Decreased balance, Decreased endurance, Decreased strength, Postural dysfunction, Pain  Visit Diagnosis: Muscle weakness (generalized)  Other abnormalities of gait and mobility  Unsteadiness on feet     Problem List Patient Active Problem List   Diagnosis Date Noted  . PTSD (post-traumatic stress disorder)   . Hypothyroidism   . History of adrenal insufficiency   . H/O diabetes insipidus   . Schizoaffective disorder ([place]) 11/08/2011  . Personality disorder   . Anemia   . Ascites   . Liver disease   . Hepatitis C   . Kidney disease   . GERD (gastroesophageal reflux disease)   . Psychosis 11/07/2011    Jamey Reas PT, DPT 10/02/2015, 12:20 PM  [place] [address] [place], [place], [address] Phone: [phone]   Fax:  [phone]  Name: [name] MRN: [id number] Date of Birth: 1960-08-14

## 2015-10-07 ENCOUNTER — Ambulatory Visit: Payer: Medicaid Other | Admitting: Physical Therapy

## 2015-10-07 ENCOUNTER — Encounter: Payer: Self-pay | Admitting: Physical Therapy

## 2015-10-07 DIAGNOSIS — R2689 Other abnormalities of gait and mobility: Secondary | ICD-10-CM

## 2015-10-07 DIAGNOSIS — M6281 Muscle weakness (generalized): Secondary | ICD-10-CM | POA: Diagnosis not present

## 2015-10-07 DIAGNOSIS — R2681 Unsteadiness on feet: Secondary | ICD-10-CM

## 2015-10-08 NOTE — Therapy (Signed)
[facility] [address] [place], [place], [address] Phone: [phone]   Fax:  [phone]  Physical Therapy Treatment  Patient Details  Name: [name] MRN: [id number] Date of Birth: 07-16-1960 Referring Provider: Kevan Ny, MD  Encounter Date: 10/07/2015      PT End of Session - 10/07/15 1330    Visit Number 6   Number of Visits 9   Authorization Type Medicaid   Authorization Time Period 09/09/2015 - 12/29/2015   Authorization - Visit Number 5   Authorization - Number of Visits 8   PT Start Time 1230   PT Stop Time 1314   PT Time Calculation (min) 44 min   Equipment Utilized During Treatment Gait belt   Activity Tolerance Patient tolerated treatment well   Behavior During Therapy [employer] for tasks assessed/performed      Past Medical History  Diagnosis Date  . Kidney disease   . GERD (gastroesophageal reflux disease)   . Liver disease   . Hepatitis C   . Ascites   . Anemia   . PTSD (post-traumatic stress disorder)   . Personality disorder   . History of ARDS   . Hx of Clostridium difficile infection   . Schizoaffective disorder ([place])   . PTSD (post-traumatic stress disorder)     SECONDARY TO WAR IN [place]  . Overdose 12/2009  . Peripheral neuropathy ([place])   . Aspiration pneumonia (HCC)     HX OF  . Pleural effusion, bilateral     HX OF  . History of blood transfusion   . History of adrenal insufficiency   . H/O diabetes insipidus   . Hypothyroidism     Past Surgical History  Procedure Laterality Date  . Right hip surgery  1996  . US guided left thoracentesis  01/21/2010    There were no vitals filed for this visit.      Subjective Assessment - 10/07/15 1240    Subjective The lift was hurting his foot so he took it out of the shoe. His right knee is hurting worse today than last week. He is doing HEP most days.    Patient is accompained by: Family member   Pertinent History Chronic kidney  disease (stage III), cirrhosis of liver, depression, GERD, Hep C, HTN, Schizophrenia (Paranoid Type), PTSD war in [place], peripheral Neuropathy, right hip fx with sg 1996   Limitations Lifting;Standing;Walking;House hold activities   Patient Stated Goals to strength right side.    Currently in Pain? Yes   Pain Score 9    Pain Location Knee   Pain Orientation Right   Pain Descriptors / Indicators Sharp   Pain Type Chronic pain   Pain Onset More than a month ago   Pain Frequency Constant   Aggravating Factors  walking too much   Pain Relieving Factors sitting to rest.                               Balance Exercises - 10/07/15 1230    Balance Exercises: Standing   Standing Eyes Opened Wide (BOA);Head turns;Foam/compliant surface  10 reps 4 directions of head movements   Standing Eyes Closed Wide (BOA);Head turns;Solid surface;Foam/compliant surface  10 reps of head movements both solid & foam           PT Education - 10/07/15 1326    Education provided (p) Yes  PT Short Term Goals - 10/01/15 1611    PT SHORT TERM GOAL #1   Title Patient demonstrates understanding of initial HEP.  (Target Date: 4th visit after evaluation)   Baseline MET 10/01/2015   Status Achieved   PT SHORT TERM GOAL #2   Title Berg Balance >30/56 indicating lower fall risk.  (Target Date: 4th visit after evaluation)   Baseline Berg Balance 32/56   Status Achieved   PT SHORT TERM GOAL #3   Title Patient ambulates 300' with LRAD with supervision.  (Target Date: 4th visit after evaluation)   Baseline partially MET 10/01/2015 patient ambulates 250' with RW or 63' with rollator walker with supervision.    Status Partially Met   PT SHORT TERM GOAL #4   Title Patient negotiates ramps & curbs with LRAD and stairs with 1 rail & cane with supervision.  (Target Date: 4th visit after evaluation)   Status On-going           PT Long Term Goals - 10/01/15 1700    PT LONG TERM GOAL  #1   Title Patient demonstrates understanding of HEP to improve strength, flexibility, balance & endurance. (Target Date: 8th visit after evaluation)   Baseline Patient is unknowledgeable in appropriate exercises to address medical condition.    Time 2   Period Months   Status On-going   PT LONG TERM GOAL #2   Title Patient ambulates 500' with LRAD modified independent to enable basic community mobility. (Target Date:8th visit after evaluation)   Baseline Patient ambulates 96' with RW with supervision. (Target Date: 3rd visit after evaluation)   Time 2   Period Months   Status On-going   PT LONG TERM GOAL #3   Title Patient negotiates ramps & curbs with LRAD and stairs with single rail modified independent for community access.  (Target Date:8th visit after evaluation)   Baseline Patient is dependent with negotiating barriers with fall risk.    Time 2   Period Months   Status On-going   PT LONG TERM GOAL #4   Title Berg Balance Test >/= 45/56 to indicate lower fall risk.  (Target Date:8th visit after evaluation)   Baseline Berg Balance 21/56   Time 2   Period Months   Status On-going   PT LONG TERM GOAL #5   Title Timed Up & Go <30 sec  (Target Date:8th visit after evaluation)   Baseline TUG 46.56sec with RW   Time 2   Period Months   Status On-going               Plan - 10/07/15 1500    Clinical Impression Statement Patient had increased right knee pain which limited activity tolerance in PT today. He reports that the heel lift inside his shoe hurt his foot so appropriately removed it. He was given information on getting 1.5" lift shoe modification lift but Medicaid does not cover this so uncertain when he can get it done.    Rehab Potential Good   PT Frequency 1x / week   PT Duration 8 weeks   PT Treatment/Interventions ADLs/Self Care Home Management;DME Instruction;Gait training;Stair training;Functional mobility training;Therapeutic activities;Therapeutic  exercise;Balance training;Neuromuscular re-education;Patient/family education   PT Next Visit Plan Instruct in HEP for balance & strength, complete OTAGO   Consulted and Agree with Plan of Care Patient;Family member/caregiver   Family Member Consulted mother      Patient will benefit from skilled therapeutic intervention in order to improve the following deficits and impairments:  Abnormal gait, Decreased activity tolerance, Decreased balance, Decreased endurance, Decreased strength, Postural dysfunction, Pain  Visit Diagnosis: Muscle weakness (generalized)  Other abnormalities of gait and mobility  Unsteadiness on feet     Problem List Patient Active Problem List   Diagnosis Date Noted  . PTSD (post-traumatic stress disorder)   . Hypothyroidism   . History of adrenal insufficiency   . H/O diabetes insipidus   . Schizoaffective disorder ([place]) 11/08/2011  . Personality disorder   . Anemia   . Ascites   . Liver disease   . Hepatitis C   . Kidney disease   . GERD (gastroesophageal reflux disease)   . Psychosis 11/07/2011    Jamey Reas PT, DPT 10/08/2015, 7:13 AM  [facility] [address] [place], [place], [address] Phone: [phone]   Fax:  [phone]  Name: [name] MRN: [id number] Date of Birth: November 19, 1960

## 2015-10-14 ENCOUNTER — Ambulatory Visit: Payer: Medicaid Other | Admitting: Physical Therapy

## 2015-10-14 ENCOUNTER — Encounter: Payer: Self-pay | Admitting: Physical Therapy

## 2015-10-14 DIAGNOSIS — M6281 Muscle weakness (generalized): Secondary | ICD-10-CM | POA: Diagnosis not present

## 2015-10-14 DIAGNOSIS — R2681 Unsteadiness on feet: Secondary | ICD-10-CM

## 2015-10-14 DIAGNOSIS — R2689 Other abnormalities of gait and mobility: Secondary | ICD-10-CM

## 2015-10-14 NOTE — Therapy (Signed)
[facility] [address] [place], [place], [address] Phone: [phone]   Fax:  [phone]  Physical Therapy Treatment  Patient Details  Name: [name] MRN: [id number] Date of Birth: [birth year] Referring Provider: Kevan Ny, MD  Encounter Date: 10/14/2015      PT End of Session - 10/14/15 2238    Visit Number 7   Number of Visits 9   Authorization Type Medicaid   Authorization Time Period 09/09/2015 - 12/29/2015   Authorization - Visit Number 6   Authorization - Number of Visits 8   PT Start Time 8315   PT Stop Time 1315   PT Time Calculation (min) 44 min   Equipment Utilized During Treatment Gait belt   Activity Tolerance Patient tolerated treatment well   Behavior During Therapy [facility] for tasks assessed/performed      Past Medical History  Diagnosis Date  . Kidney disease   . GERD (gastroesophageal reflux disease)   . Liver disease   . Hepatitis C   . Ascites   . Anemia   . PTSD (post-traumatic stress disorder)   . Personality disorder   . History of ARDS   . Hx of Clostridium difficile infection   . Schizoaffective disorder ([place])   . PTSD (post-traumatic stress disorder)     SECONDARY TO WAR IN [place]  . Overdose 12/2009  . Peripheral neuropathy ([place])   . Aspiration pneumonia (H[facility])     HX OF  . Pleural effusion, bilateral     HX OF  . History of blood transfusion   . History of adrenal insufficiency   . H/O diabetes insipidus   . Hypothyroidism     Past Surgical History  Procedure Laterality Date  . Right hip surgery  1996  . US guided left thoracentesis  01/21/2010    There were no vitals filed for this visit.      Subjective Assessment - 10/14/15 1244    Subjective He saw the orthotist yesterday and he is having 1.5" added to 2 shoes. He chose "sports" shoes which he reports is best that he has.    Patient is accompained by: Family member   Pertinent History Chronic kidney disease  (stage III), cirrhosis of liver, depression, GERD, Hep C, HTN, Schizophrenia (Paranoid Type), PTSD war in [place], peripheral Neuropathy, right hip fx with sg 1996   Limitations Lifting;Standing;Walking;House hold activities   Patient Stated Goals to strength right side.    Currently in Pain? Yes   Pain Score 7    Pain Location Knee   Pain Orientation Right   Pain Descriptors / Indicators Sharp   Pain Type Chronic pain   Pain Onset More than a month ago   Pain Frequency Constant   Aggravating Factors  walking too much   Pain Relieving Factors sitting to rest     Gait Training with rollator walker:  Pt ambulated 200' with cues on safety and wheel lock management.   Therapeutic Exercise: NuStep with LEs level 3 X 10 minutes with instruction in set-up and benefits. While pt performed NuStep, PT instructed in option of [facility]. Mat exercises: supine straight leg raise with cues on knee extension, sidelying hip abduction from adducted position (supported on mat table) limited ROM due to strength, PT positioned LE on wedge /bolster for hip position in neutral for hip abduction. 10 reps each with verbal cues.  PT Education - 10/14/15 1230    Education provided Yes   Education Details [employer] as option for community exercise program   Person(s) Educated Patient   Methods Explanation;Other (comment)  printout for internet   Comprehension Verbalized understanding;Verbal cues required;Need further instruction          PT Short Term Goals - 10/01/15 1611    PT SHORT TERM GOAL #1   Title Patient demonstrates understanding of initial HEP.  (Target Date: 4th visit after evaluation)   Baseline MET 10/01/2015   Status Achieved   PT SHORT TERM GOAL #2   Title Berg Balance >30/56 indicating lower fall risk.  (Target Date: 4th visit after evaluation)   Baseline Berg Balance 32/56   Status Achieved   PT SHORT TERM GOAL #3    Title Patient ambulates 300' with LRAD with supervision.  (Target Date: 4th visit after evaluation)   Baseline partially MET 10/01/2015 patient ambulates 250' with RW or 86' with rollator walker with supervision.    Status Partially Met   PT SHORT TERM GOAL #4   Title Patient negotiates ramps & curbs with LRAD and stairs with 1 rail & cane with supervision.  (Target Date: 4th visit after evaluation)   Status On-going           PT Long Term Goals - 10/01/15 1700    PT LONG TERM GOAL #1   Title Patient demonstrates understanding of HEP to improve strength, flexibility, balance & endurance. (Target Date: 8th visit after evaluation)   Baseline Patient is unknowledgeable in appropriate exercises to address medical condition.    Time 2   Period Months   Status On-going   PT LONG TERM GOAL #2   Title Patient ambulates 500' with LRAD modified independent to enable basic community mobility. (Target Date:8th visit after evaluation)   Baseline Patient ambulates 23' with RW with supervision. (Target Date: 3rd visit after evaluation)   Time 2   Period Months   Status On-going   PT LONG TERM GOAL #3   Title Patient negotiates ramps & curbs with LRAD and stairs with single rail modified independent for community access.  (Target Date:8th visit after evaluation)   Baseline Patient is dependent with negotiating barriers with fall risk.    Time 2   Period Months   Status On-going   PT LONG TERM GOAL #4   Title Berg Balance Test >/= 45/56 to indicate lower fall risk.  (Target Date:8th visit after evaluation)   Baseline Berg Balance 21/56   Time 2   Period Months   Status On-going   PT LONG TERM GOAL #5   Title Timed Up & Go <30 sec  (Target Date:8th visit after evaluation)   Baseline TUG 46.56sec with RW   Time 2   Period Months   Status On-going               Plan - 10/14/15 2239    Clinical Impression Statement Patient appears would benefit from an exercise program in community  and [facility] may be the most cost effective if he can arrange transportation.    Rehab Potential Good   PT Frequency 1x / week   PT Duration 8 weeks   PT Treatment/Interventions ADLs/Self Care Home Management;DME Instruction;Gait training;Stair training;Functional mobility training;Therapeutic activities;Therapeutic exercise;Balance training;Neuromuscular re-education;Patient/family education   PT Next Visit Plan complete OTAGO, gait with rollator walker, mat exercises   Consulted and Agree with Plan of Care Patient;Family member/caregiver  Family Member Consulted mother      Patient will benefit from skilled therapeutic intervention in order to improve the following deficits and impairments:  Abnormal gait, Decreased activity tolerance, Decreased balance, Decreased endurance, Decreased strength, Postural dysfunction, Pain  Visit Diagnosis: Muscle weakness (generalized)  Other abnormalities of gait and mobility  Unsteadiness on feet     Problem List Patient Active Problem List   Diagnosis Date Noted  . PTSD (post-traumatic stress disorder)   . Hypothyroidism   . History of adrenal insufficiency   . H/O diabetes insipidus   . Schizoaffective disorder ([place]) 11/08/2011  . Personality disorder   . Anemia   . Ascites   . Liver disease   . Hepatitis C   . Kidney disease   . GERD (gastroesophageal reflux disease)   . Psychosis 11/07/2011    Jamey Reas PT, DPT 10/14/2015, 10:47 PM  [facility] [address] [place], [place], [address] Phone: [phone]   Fax:  [phone]  Name: [name] MRN: [id number] Date of Birth: 03/06/1961

## 2015-10-21 ENCOUNTER — Ambulatory Visit: Payer: Medicaid Other | Admitting: Physical Therapy

## 2015-10-21 ENCOUNTER — Encounter: Payer: Self-pay | Admitting: Physical Therapy

## 2015-10-21 DIAGNOSIS — R2689 Other abnormalities of gait and mobility: Secondary | ICD-10-CM

## 2015-10-21 DIAGNOSIS — M6281 Muscle weakness (generalized): Secondary | ICD-10-CM

## 2015-10-21 DIAGNOSIS — R2681 Unsteadiness on feet: Secondary | ICD-10-CM

## 2015-10-22 NOTE — Therapy (Signed)
[facility] [address] [place], [place], [address] Phone: [phone]   Fax:  [phone]  Physical Therapy Treatment  Patient Details  Name: [name] MRN: [id number] Date of Birth: November 24, 1960 Referring Provider: Kevan Ny, MD  Encounter Date: 10/21/2015   10/21/15 1237  PT Visits / Re-Eval  Visit Number 8  Number of Visits 9  Authorization  Authorization Type Medicaid  Authorization Time Period 09/09/2015 - 12/29/2015  Authorization - Visit Number 7  Authorization - Number of Visits 8  PT Time Calculation  PT Start Time 1232  PT Stop Time 1315  PT Time Calculation (min) 43 min  PT - End of Session  Equipment Utilized During Treatment Gait belt  Activity Tolerance Patient tolerated treatment well  Behavior During Therapy [facility] for tasks assessed/performed     Past Medical History:  Diagnosis Date  . Anemia   . Ascites   . Aspiration pneumonia (HCC)    HX OF  . GERD (gastroesophageal reflux disease)   . H/O diabetes insipidus   . Hepatitis C   . History of adrenal insufficiency   . History of ARDS   . History of blood transfusion   . Hx of Clostridium difficile infection   . Hypothyroidism   . Kidney disease   . Liver disease   . Overdose 12/2009  . Peripheral neuropathy ([place])   . Personality disorder   . Pleural effusion, bilateral    HX OF  . PTSD (post-traumatic stress disorder)   . PTSD (post-traumatic stress disorder)    SECONDARY TO WAR IN [place]  . Schizoaffective disorder [facility])     Past Surgical History:  Procedure Laterality Date  . RIGHT HIP SURGERY  1996  . US GUIDED LEFT THORACENTESIS  01/21/2010    There were no vitals filed for this visit.     10/21/15 1236  Symptoms/Limitations  Subjective No new complaints. No falls to report.   Patient is accompained by: Family member  Pertinent History Chronic kidney disease (stage III), cirrhosis of liver, depression, GERD, Hep C,  HTN, Schizophrenia (Paranoid Type), PTSD war in [place], peripheral Neuropathy, right hip fx with sg 1996  Limitations Lifting;Standing;Walking;House hold activities  Patient Stated Goals to strength right side.   Pain Assessment  Currently in Pain? Yes  Pain Score 7  Pain Location Knee  Pain Orientation Right  Pain Descriptors / Indicators Sharp;Sore  Pain Type Chronic pain  Pain Onset More than a month ago  Pain Frequency Constant  Aggravating Factors  increased walking  Pain Relieving Factors sitting to rest      10/21/15 1241  Transfers  Transfers Sit to Stand;Stand to Sit;Stand Pivot Transfers  Sit to Stand 5: Supervision;With upper extremity assist;With armrests;From bed  Stand to Sit 5: Supervision;With upper extremity assist;To bed  Number of Reps 10 reps;2 sets  Transfer Cueing x 10 reps from 21.5 inch high mat with hands on knees, cues on sequencing/technique; x10 reps from 23.5 inch high mat with no UE support, cues on technique and to come up into full upright posture with each rep.                             Ambulation/Gait  Ambulation/Gait Yes  Ambulation/Gait Assistance 4: Min assist  Ambulation/Gait Assistance Details cues to slow down and for rollator proximity with gait, pt pushing rollator too far away, assist to keep it closer for safety. increased knee pain  with gait reported that eased off with seated rest break.                     Ambulation Distance (Feet) 230 Feet  Assistive device Rollator  Gait Pattern Step-through pattern;Decreased step length - right;Decreased hip/knee flexion - right;Right steppage;Right foot flat;Right flexed knee in stance;Left flexed knee in stance;Trunk flexed;Wide base of support  Ambulation Surface Level;Indoor  Knee/Hip Exercises: Supine  Bridges AROM;Strengthening;Both;2 sets;10 reps;Limitations (3 sec holds)  Straight Leg Raises AROM;AAROM;Strengthening;Both;2 sets;10 reps;Limitations  Straight Leg Raises Limitations  increased assist needed with right leg both ways, assist with left leg to control descent/return only; cues for knee extension with all reps both legs  Other Supine Knee/Hip Exercises hook lying with blue band tied around knees: hip abduction/ER, 3 sec holds x 10 reps  Knee/Hip Exercises: Sidelying  Hip ABduction AROM;Strengthening;Both;1 set;10 reps  Clams with pillow between legs: cues on form and technique x 10 reps each side.           PT Short Term Goals - 10/01/15 1611      PT SHORT TERM GOAL #1   Title Patient demonstrates understanding of initial HEP.  (Target Date: 4th visit after evaluation)   Baseline MET 10/01/2015   Status Achieved     PT SHORT TERM GOAL #2   Title Berg Balance >30/56 indicating lower fall risk.  (Target Date: 4th visit after evaluation)   Baseline Berg Balance 32/56   Status Achieved     PT SHORT TERM GOAL #3   Title Patient ambulates 300' with LRAD with supervision.  (Target Date: 4th visit after evaluation)   Baseline partially MET 10/01/2015 patient ambulates 250' with RW or 42' with rollator walker with supervision.    Status Partially Met     PT SHORT TERM GOAL #4   Title Patient negotiates ramps & curbs with LRAD and stairs with 1 rail & cane with supervision.  (Target Date: 4th visit after evaluation)   Status On-going           PT Long Term Goals - 10/01/15 1700      PT LONG TERM GOAL #1   Title Patient demonstrates understanding of HEP to improve strength, flexibility, balance & endurance. (Target Date: 8th visit after evaluation)   Baseline Patient is unknowledgeable in appropriate exercises to address medical condition.    Time 2   Period Months   Status On-going     PT LONG TERM GOAL #2   Title Patient ambulates 500' with LRAD modified independent to enable basic community mobility. (Target Date:8th visit after evaluation)   Baseline Patient ambulates 30' with RW with supervision. (Target Date: 3rd visit after evaluation)    Time 2   Period Months   Status On-going     PT LONG TERM GOAL #3   Title Patient negotiates ramps & curbs with LRAD and stairs with single rail modified independent for community access.  (Target Date:8th visit after evaluation)   Baseline Patient is dependent with negotiating barriers with fall risk.    Time 2   Period Months   Status On-going     PT LONG TERM GOAL #4   Title Berg Balance Test >/= 45/56 to indicate lower fall risk.  (Target Date:8th visit after evaluation)   Baseline Berg Balance 21/56   Time 2   Period Months   Status On-going     PT LONG TERM GOAL #5   Title Timed Up & Go <30  sec  (Target Date:8th visit after evaluation)   Baseline TUG 46.56sec with RW   Time 2   Period Months   Status On-going        10/21/15 1238  Plan  Clinical Impression Statement Today's session continued to address gait with rollator and LE' strengthening. Pt is making slow, steady progress toward goals  Pt will benefit from skilled therapeutic intervention in order to improve on the following deficits Abnormal gait;Decreased activity tolerance;Decreased balance;Decreased endurance;Decreased strength;Postural dysfunction;Pain  Rehab Potential Good  PT Frequency 1x / week  PT Duration 8 weeks  PT Treatment/Interventions ADLs/Self Care Home Management;DME Instruction;Gait training;Stair training;Functional mobility training;Therapeutic activities;Therapeutic exercise;Balance training;Neuromuscular re-education;Patient/family education  PT Next Visit Plan assess LTGs  Consulted and Agree with Plan of Care Patient;Family member/caregiver  Family Member Consulted mother       Patient will benefit from skilled therapeutic intervention in order to improve the following deficits and impairments:  Abnormal gait, Decreased activity tolerance, Decreased balance, Decreased endurance, Decreased strength, Postural dysfunction, Pain  Visit Diagnosis: Muscle weakness (generalized)  Other  abnormalities of gait and mobility  Unsteadiness on feet     Problem List Patient Active Problem List   Diagnosis Date Noted  . PTSD (post-traumatic stress disorder)   . Hypothyroidism   . History of adrenal insufficiency   . H/O diabetes insipidus   . Schizoaffective disorder ([place]) 11/08/2011  . Personality disorder   . Anemia   . Ascites   . Liver disease   . Hepatitis C   . Kidney disease   . GERD (gastroesophageal reflux disease)   . Psychosis 11/07/2011    Willow Ora, PTA, [facility] [address] [place], [place] [address] [phone] 10/22/15, 8:44 PM   Name: [name] MRN: [id number] Date of Birth: 1961-02-13

## 2015-10-28 ENCOUNTER — Ambulatory Visit: Payer: Self-pay | Admitting: Physical Therapy

## 2015-11-04 ENCOUNTER — Ambulatory Visit: Payer: Medicaid Other | Attending: Internal Medicine | Admitting: Physical Therapy

## 2015-11-04 ENCOUNTER — Encounter: Payer: Self-pay | Admitting: Physical Therapy

## 2015-11-04 DIAGNOSIS — M6281 Muscle weakness (generalized): Secondary | ICD-10-CM | POA: Diagnosis not present

## 2015-11-04 DIAGNOSIS — R2689 Other abnormalities of gait and mobility: Secondary | ICD-10-CM | POA: Diagnosis present

## 2015-11-04 DIAGNOSIS — R2681 Unsteadiness on feet: Secondary | ICD-10-CM | POA: Insufficient documentation

## 2015-11-04 NOTE — Therapy (Signed)
[facility] [address] [place], [place], [address] Phone: [phone]   Fax:  [phone]  Physical Therapy Treatment  Patient Details  Name: [name] MRN: [id number] Date of Birth: 1961-01-08 Referring Provider: Kevan Ny, MD  Encounter Date: 11/04/2015      PT End of Session - 11/04/15 1813    Visit Number 9   Number of Visits 9   Authorization Type Medicaid   Authorization Time Period 09/09/2015 - 12/29/2015   Authorization - Visit Number 8   Authorization - Number of Visits 8   PT Start Time 1332   PT Stop Time 1315   PT Time Calculation (min) 1423 min   Equipment Utilized During Treatment Gait belt   Activity Tolerance Patient tolerated treatment well   Behavior During Therapy [facility] for tasks assessed/performed      Past Medical History:  Diagnosis Date  . Anemia   . Ascites   . Aspiration pneumonia (HCC)    HX OF  . GERD (gastroesophageal reflux disease)   . H/O diabetes insipidus   . Hepatitis C   . History of adrenal insufficiency   . History of ARDS   . History of blood transfusion   . Hx of Clostridium difficile infection   . Hypothyroidism   . Kidney disease   . Liver disease   . Overdose 12/2009  . Peripheral neuropathy ([place])   . Personality disorder   . Pleural effusion, bilateral    HX OF  . PTSD (post-traumatic stress disorder)   . PTSD (post-traumatic stress disorder)    SECONDARY TO WAR IN [place]  . Schizoaffective disorder [facility])     Past Surgical History:  Procedure Laterality Date  . RIGHT HIP SURGERY  1996  . US GUIDED LEFT THORACENTESIS  01/21/2010    There were no vitals filed for this visit.      Subjective Assessment - 11/04/15 1240    Subjective He got lift for right shoe last week and it seems to help his right knee pain.    Patient is accompained by: Family member   Pertinent History Chronic kidney disease (stage III), cirrhosis of liver, depression, GERD, Hep  C, HTN, Schizophrenia (Paranoid Type), PTSD war in [place], peripheral Neuropathy, right hip fx with sg 1996   Limitations Lifting;Standing;Walking;House hold activities   Patient Stated Goals to strength right side.    Currently in Pain? Yes   Pain Score 6    Pain Location Knee   Pain Orientation Right   Pain Descriptors / Indicators Sharp;Sore   Pain Type Chronic pain   Pain Onset More than a month ago   Pain Frequency Constant   Aggravating Factors  increased walking   Pain Relieving Factors sitting to rest   Effect of Pain on Daily Activities limits walking            [facility] PT Assessment - 11/04/15 1230      Ambulation/Gait   Ambulation/Gait Yes   Ambulation/Gait Assistance 6: Modified independent (Device/Increase time)   Ambulation/Gait Assistance Details PT raised height of his RW   Ambulation Distance (Feet) 275 Feet  limited by chronic knee pain   Assistive device Rolling walker   Gait Pattern Step-through pattern;Decreased step length - right;Decreased hip/knee flexion - right;Right steppage;Right foot flat;Right flexed knee in stance;Left flexed knee in stance;Trunk flexed;Wide base of support   Ramp 6: Modified independent (Device)  RW   Curb 6: Modified independent (Device/increase time)  RW  Berg Balance Test   Sit to Stand Able to stand  independently using hands   Standing Unsupported Able to stand safely 2 minutes   Sitting with Back Unsupported but Feet Supported on Floor or Stool Able to sit safely and securely 2 minutes   Stand to Sit Controls descent by using hands   Transfers Able to transfer safely, definite need of hands   Standing Unsupported with Eyes Closed Able to stand 10 seconds safely   Standing Ubsupported with Feet Together Able to place feet together independently and stand 1 minute safely   From Standing, Reach Forward with Outstretched Arm Can reach confidently >25 cm (10")   From Standing Position, Pick up Object from Floor Able to  pick up shoe, needs supervision   From Standing Position, Turn to Look Behind Over each Shoulder Looks behind one side only/other side shows less weight shift   Turn 360 Degrees Able to turn 360 degrees safely but slowly   Standing Unsupported, Alternately Place Feet on Step/Stool Able to complete >2 steps/needs minimal assist   Standing Unsupported, One Foot in Front Able to take small step independently and hold 30 seconds   Standing on One Leg Tries to lift leg/unable to hold 3 seconds but remains standing independently   Total Score 41     Timed Up and Go Test   Normal TUG (seconds) 17.74  No device                             PT Education - 11/04/15 1230    Education provided Yes   Education Details ongoing HEP [employer]) Educated Patient;Parent(s)   Methods Explanation;Verbal cues   Comprehension Verbalized understanding          PT Short Term Goals - 10/01/15 1611      PT SHORT TERM GOAL #1   Title Patient demonstrates understanding of initial HEP.  (Target Date: 4th visit after evaluation)   Baseline MET 10/01/2015   Status Achieved     PT SHORT TERM GOAL #2   Title Berg Balance >30/56 indicating lower fall risk.  (Target Date: 4th visit after evaluation)   Baseline Berg Balance 32/56   Status Achieved     PT SHORT TERM GOAL #3   Title Patient ambulates 300' with LRAD with supervision.  (Target Date: 4th visit after evaluation)   Baseline partially MET 10/01/2015 patient ambulates 250' with RW or 21' with rollator walker with supervision.    Status Partially Met     PT SHORT TERM GOAL #4   Title Patient negotiates ramps & curbs with LRAD and stairs with 1 rail & cane with supervision.  (Target Date: 4th visit after evaluation)   Status On-going           PT Long Term Goals - 11/04/15 1230      PT LONG TERM GOAL #1   Title Patient demonstrates understanding of HEP to improve strength, flexibility, balance &  endurance. (Target Date: 8th visit after evaluation)   Baseline MET 11/04/2015   Time 2   Period Months   Status Achieved     PT LONG TERM GOAL #2   Title Patient ambulates 500' with LRAD modified independent to enable basic community mobility. (Target Date:8th visit after evaluation)   Baseline Partial MET 11/04/2015 Pt ambulates 275' with RW modified independent limited by chronic knee pain.    Time 2  Period Months   Status Partially Met     PT LONG TERM GOAL #3   Title Patient negotiates ramps & curbs with LRAD and stairs with single rail modified independent for community access.  (Target Date:8th visit after evaluation)   Baseline MET 11/04/2015 with RW   Time 2   Period Months   Status Achieved     PT LONG TERM GOAL #4   Title Berg Balance Test >/= 45/56 to indicate lower fall risk.  (Target Date:8th visit after evaluation)   Baseline Partially MET 11/04/2015 Berg Balance improved to 41/56   Time 2   Period Months   Status Partially Met     PT LONG TERM GOAL #5   Title Timed Up & Go <30 sec  (Target Date:8th visit after evaluation)   Baseline MET 11/04/2015 TUG without device 17.74sec   Time 2   Period Months   Status Achieved               Plan - 11/04/15 1814    Clinical Impression Statement Patient met or partially met all LTGs set. He is ambulating with RW at community level and reports household with no device limited distances. He reports 1.5" shoe lift and taller RW help walking with less pain. Pt appears to understand recommendation for ongoing fitness plan Rea.    Rehab Potential Good   PT Frequency 1x / week   PT Duration 8 weeks   PT Treatment/Interventions ADLs/Self Care Home Management;DME Instruction;Gait training;Stair training;Functional mobility training;Therapeutic activities;Therapeutic exercise;Balance training;Neuromuscular re-education;Patient/family education   PT Next Visit Plan discharge today   Consulted and Agree with  Plan of Care Patient;Family member/caregiver   Family Member Consulted mother      Patient will benefit from skilled therapeutic intervention in order to improve the following deficits and impairments:  Abnormal gait, Decreased activity tolerance, Decreased balance, Decreased endurance, Decreased strength, Postural dysfunction, Pain  Visit Diagnosis: Muscle weakness (generalized)  Other abnormalities of gait and mobility  Unsteadiness on feet     Problem List Patient Active Problem List   Diagnosis Date Noted  . PTSD (post-traumatic stress disorder)   . Hypothyroidism   . History of adrenal insufficiency   . H/O diabetes insipidus   . Schizoaffective disorder ([place]) 11/08/2011  . Personality disorder   . Anemia   . Ascites   . Liver disease   . Hepatitis C   . Kidney disease   . GERD (gastroesophageal reflux disease)   . Psychosis 11/07/2011    PHYSICAL THERAPY DISCHARGE SUMMARY  Visits from Start of Care: 9  Current functional level related to goals / functional outcomes: See above   Remaining deficits: See above   Education / Equipment: HEP / ongoing fitness plan  Plan: Patient agrees to discharge.  Patient goals were partially met. Patient is being discharged due to meeting the stated rehab goals.  ?????        Xuan Mateus PT, DPT 11/04/2015, 6:19 PM  [facility] [address] [place], [place], [address] Phone: [phone]   Fax:  [phone]  Name: [name] MRN: [id number] Date of Birth: 12-14-1960

## 2015-11-11 ENCOUNTER — Ambulatory Visit: Payer: Self-pay | Admitting: Physical Therapy

## 2015-11-13 DIAGNOSIS — F17210 Nicotine dependence, cigarettes, uncomplicated: Secondary | ICD-10-CM | POA: Diagnosis not present

## 2015-11-13 DIAGNOSIS — R112 Nausea with vomiting, unspecified: Secondary | ICD-10-CM | POA: Insufficient documentation

## 2015-11-13 DIAGNOSIS — Z791 Long term (current) use of non-steroidal anti-inflammatories (NSAID): Secondary | ICD-10-CM | POA: Diagnosis not present

## 2015-11-13 DIAGNOSIS — E039 Hypothyroidism, unspecified: Secondary | ICD-10-CM | POA: Diagnosis not present

## 2015-11-13 NOTE — ED Triage Notes (Signed)
Pt from home via EMS with complaints of nausea without vomiting that began after he ate sweet foods for dinner

## 2015-11-13 NOTE — ED Triage Notes (Signed)
Per [facility], Pt from home c/o nausea and vomiting, abdominal pain. Pt seen a couple of weeks ago for the same. A&Ox4 and ambulatory.

## 2015-11-14 ENCOUNTER — Encounter (HOSPITAL_COMMUNITY): Payer: Self-pay | Admitting: Emergency Medicine

## 2015-11-14 ENCOUNTER — Emergency Department (HOSPITAL_COMMUNITY)
Admission: EM | Admit: 2015-11-14 | Discharge: 2015-11-14 | Disposition: A | Discharge status: Home / Self Care | Payer: Medicaid Other | Attending: Emergency Medicine | Admitting: Emergency Medicine

## 2015-11-14 DIAGNOSIS — R110 Nausea: Secondary | ICD-10-CM

## 2015-11-14 LAB — CBC
HEMATOCRIT: 36.8 % — AB (ref 39.0–52.0)
HEMOGLOBIN: 12.6 g/dL — AB (ref 13.0–17.0)
MCH: 31.8 pg (ref 26.0–34.0)
MCHC: 34.2 g/dL (ref 30.0–36.0)
MCV: 92.9 fL (ref 78.0–100.0)
Platelets: 103 10*3/uL — ABNORMAL LOW (ref 150–400)
RBC: 3.96 MIL/uL — AB (ref 4.22–5.81)
RDW: 15.4 % (ref 11.5–15.5)
WBC: 3.7 10*3/uL — ABNORMAL LOW (ref 4.0–10.5)

## 2015-11-14 LAB — COMPREHENSIVE METABOLIC PANEL
ALK PHOS: 88 U/L (ref 38–126)
ALT: 18 U/L (ref 17–63)
AST: 27 U/L (ref 15–41)
Albumin: 4.1 g/dL (ref 3.5–5.0)
Anion gap: 7 (ref 5–15)
BUN: 26 mg/dL — AB (ref 6–20)
CALCIUM: 10.4 mg/dL — AB (ref 8.9–10.3)
CHLORIDE: 105 mmol/L (ref 101–111)
CO2: 22 mmol/L (ref 22–32)
CREATININE: 2.43 mg/dL — AB (ref 0.61–1.24)
GFR, EST AFRICAN AMERICAN: 33 mL/min — AB (ref >60)
GFR, EST NON AFRICAN AMERICAN: 28 mL/min — AB (ref >60)
Glucose, Bld: 100 mg/dL — ABNORMAL HIGH (ref 65–99)
Potassium: 5.1 mmol/L (ref 3.5–5.1)
Sodium: 134 mmol/L — ABNORMAL LOW (ref 135–145)
Total Bilirubin: 0.6 mg/dL (ref 0.3–1.2)
Total Protein: 7.1 g/dL (ref 6.5–8.1)

## 2015-11-14 LAB — LIPASE, BLOOD: LIPASE: 37 U/L (ref 11–51)

## 2015-11-14 MED ORDER — ONDANSETRON 8 MG PO TBDP
8.0000 mg | ORAL_TABLET | Freq: Once | ORAL | Status: AC
  Administered 2015-11-14: 8 mg via ORAL
  Filled 2015-11-14: qty 1

## 2015-11-14 MED ORDER — ONDANSETRON 8 MG PO TBDP: 8.0000 mg | ORAL_TABLET | Freq: Three times a day (TID) | ORAL | 0 refills | Status: DC | PRN

## 2015-11-14 MED ORDER — SODIUM CHLORIDE 0.9 % IV SOLN: 1000.0000 mL | Freq: Once | INTRAVENOUS | Status: DC

## 2015-11-14 MED ORDER — SODIUM CHLORIDE 0.9 % IV SOLN: 1000.0000 mL | INTRAVENOUS | Status: DC

## 2015-11-14 MED ORDER — ONDANSETRON HCL 4 MG/2ML IJ SOLN: 4.0000 mg | Freq: Once | INTRAMUSCULAR | Status: DC

## 2015-11-14 NOTE — ED Provider Notes (Signed)
[facility]-EMERGENCY DEPT Provider Note   CSN: [id number] Arrival date & time: 11/13/15  2346     History   Chief Complaint Chief Complaint  Patient presents with  . Nausea    HPI [name] is a 55 y.o. male.  Pt presents to the emergency department after developing nausea and vomiting last night.  He denies hematemesis.  He does have a history of cirrhosis secondary to hepatitis C.  He reports mild sharp abdominal pain which is now resolved.  No fevers or chills.  No urinary complaints.  No back pain or flank pain.  Denies chest pain or shortness of breath.  States he continues to be nauseated this time.  He has not vomited in several hours per the patient.   The history is provided by the patient and medical records.    Past Medical History:  Diagnosis Date  . Anemia   . Ascites   . Aspiration pneumonia (HCC)    HX OF  . GERD (gastroesophageal reflux disease)   . H/O diabetes insipidus   . Hepatitis C   . History of adrenal insufficiency   . History of ARDS   . History of blood transfusion   . Hx of Clostridium difficile infection   . Hypothyroidism   . Kidney disease   . Liver disease   . Overdose 12/2009  . Peripheral neuropathy (HCC)   . Personality disorder   . Pleural effusion, bilateral    HX OF  . PTSD (post-traumatic stress disorder)   . PTSD (post-traumatic stress disorder)    SECONDARY TO WAR IN [place]  . Schizoaffective disorder [facility])     Patient Active Problem List   Diagnosis Date Noted  . PTSD (post-traumatic stress disorder)   . Hypothyroidism   . History of adrenal insufficiency   . H/O diabetes insipidus   . Schizoaffective disorder (HCC) 11/08/2011  . Personality disorder   . Anemia   . Ascites   . Liver disease   . Hepatitis C   . Kidney disease   . GERD (gastroesophageal reflux disease)   . Psychosis 11/07/2011    Past Surgical History:  Procedure Laterality Date  . RIGHT HIP SURGERY  1996  . US GUIDED LEFT THORACENTESIS   01/21/2010       Home Medications    Prior to Admission medications   Medication Sig Start Date End Date Taking? Authorizing Provider  b complex vitamins capsule Take 1 capsule by mouth daily.    Historical Provider, MD  benztropine (COGENTIN) 1 MG tablet Take 1 mg by mouth 2 (two) times daily as needed for tremors.     Historical Provider, MD  Cholecalciferol (VITAMIN D-3) 1000 UNITS CAPS Take 2,000 Units by mouth daily.    Historical Provider, MD  Cholecalciferol (VITAMIN D3) 2000 units capsule TAKE 1 CAPSULE BY ORAL ROUTE DAILY FOR 90 DAYS 10/21/15   Historical Provider, MD  diclofenac (VOLTAREN) 75 MG EC tablet Take 75 mg by mouth 2 (two) times daily.    Historical Provider, MD  diphenhydrAMINE (SOMINEX) 25 MG tablet Take 25 mg by mouth at bedtime.    Historical Provider, MD  furosemide (LASIX) 20 MG tablet Take 20 mg by mouth 2 (two) times daily.    Historical Provider, MD  gabapentin (NEURONTIN) 100 MG capsule Take 200 mg by mouth 3 (three) times daily.    Historical Provider, MD  haloperidol decanoate (HALDOL DECANOATE) 100 MG/ML injection INJECT 2 ML (2 VIALS) INTRAMUSCULARLY EVERY 4 WEEKS 11/04/15  Historical Provider, MD  haloperidol lactate (HALDOL) 5 MG/ML injection every 30 (thirty) days.    Historical Provider, MD  HYDROcodone-acetaminophen (NORCO/VICODIN) 5-325 MG per tablet Take 1 tablet by mouth every 6 (six) hours as needed for moderate pain. Patient not taking: Reported on 09/18/2015 12/18/14   Elwin Mocha, MD  LACTOBACILLUS PO Take 1 capsule by mouth daily.    Historical Provider, MD  meloxicam (MOBIC) 15 MG tablet Take 1 tablet by mouth daily as needed. pain 12/11/14   Historical Provider, MD  methylPREDNISolone (MEDROL DOSEPAK) 4 MG TBPK tablet Take by mouth as directed.    Historical Provider, MD  omeprazole (PRILOSEC) 20 MG capsule Take 20 mg by mouth daily.    Historical Provider, MD  ondansetron (ZOFRAN ODT) 8 MG disintegrating tablet Take 1 tablet (8 mg total) by  mouth every 8 (eight) hours as needed for nausea or vomiting. 11/14/15   Azalia Bilis, MD  oxyCODONE-acetaminophen (PERCOCET) 7.5-325 MG tablet Take 1 tablet by mouth every 6 (six) hours as needed for severe pain. Patient not taking: Reported on 09/18/2015 04/17/15   Melton Krebs, PA-C  rifaximin (XIFAXAN) 550 MG TABS tablet Take 550 mg by mouth 2 (two) times daily.    Historical Provider, MD  spironolactone (ALDACTONE) 50 MG tablet Take 50 mg by mouth daily.    Historical Provider, MD    Family History No family history on file.  Social History Social History  Substance Use Topics  . Smoking status: Current Every Day Smoker    Packs/day: 1.00    Years: 25.00    Types: Cigarettes  . Smokeless tobacco: Never Used  . Alcohol use No     Comment: Pt denies      Allergies   Heparin; Imipramine; Lamictal [lamotrigine]; and Penicillins   Review of Systems Review of Systems  All other systems reviewed and are negative.    Physical Exam Updated Vital Signs BP 104/57 (BP Location: Left Arm)   Pulse 66   Temp 98.7 F (37.1 C) (Oral)   Resp 16   Ht 6\' 6"  (1.981 m)   Wt 280 lb (127 kg)   SpO2 96%   BMI 32.36 kg/m   Physical Exam  Constitutional: He is oriented to person, place, and time. He appears well-developed and well-nourished.  HENT:  Head: Normocephalic and atraumatic.  Eyes: EOM are normal.  Neck: Normal range of motion.  Cardiovascular: Normal rate and regular rhythm.   Pulmonary/Chest: Effort normal and breath sounds normal. No respiratory distress.  Abdominal: Soft. He exhibits no distension. There is no tenderness.  Musculoskeletal: Normal range of motion.  Neurological: He is alert and oriented to person, place, and time.  Skin: Skin is warm and dry.  Psychiatric: He has a normal mood and affect. Judgment normal.  Nursing note and vitals reviewed.    ED Treatments / Results  Labs (all labs ordered are listed, but only abnormal results are  displayed) Labs Reviewed  COMPREHENSIVE METABOLIC PANEL - Abnormal; Notable for the following:       Result Value   Sodium 134 (*)    Glucose, Bld 100 (*)    BUN 26 (*)    Creatinine, Ser 2.43 (*)    Calcium 10.4 (*)    GFR calc non Af Amer 28 (*)    GFR calc Af Amer 33 (*)    All other components within normal limits  CBC - Abnormal; Notable for the following:    WBC 3.7 (*)  RBC 3.96 (*)    Hemoglobin 12.6 (*)    HCT 36.8 (*)    Platelets 103 (*)    All other components within normal limits  LIPASE, BLOOD    EKG  EKG Interpretation None       Radiology No results found.  Procedures Procedures (including critical care time)  Medications Ordered in ED Medications  ondansetron (ZOFRAN-ODT) disintegrating tablet 8 mg (8 mg Oral Given 11/14/15 0730)     Initial Impression / Assessment and Plan / ED Course  I have reviewed the triage vital signs and the nursing notes.  Pertinent labs & imaging results that were available during my care of the patient were reviewed by me and considered in my medical decision making (see chart for details).  Clinical Course    8:19 AM Patient feels much better after Zofran emergency department.  Repeat abdominal exam is without tenderness.  I do not think he needs additional workup in the ER at this time.  He will be discharged home with a prescription for Zofran.  His labs are without significant abnormality.  He understands to return to the ER for new or worsening symptoms.  Primary care follow-up.  Final Clinical Impressions(s) / ED Diagnoses   Final diagnoses:  Nausea    New Prescriptions New Prescriptions   ONDANSETRON (ZOFRAN ODT) 8 MG DISINTEGRATING TABLET    Take 1 tablet (8 mg total) by mouth every 8 (eight) hours as needed for nausea or vomiting.     Azalia Bilis, MD 11/14/15 [phone]

## 2015-11-14 NOTE — ED Notes (Signed)
Patient reports mild nausea but states that abdominal pain has resolved.  Denies diarrhea.

## 2016-05-06 ENCOUNTER — Other Ambulatory Visit: Payer: Self-pay | Admitting: Nephrology

## 2016-05-06 DIAGNOSIS — N182 Chronic kidney disease, stage 2 (mild): Secondary | ICD-10-CM

## 2016-05-13 ENCOUNTER — Other Ambulatory Visit: Payer: Self-pay

## 2016-05-17 ENCOUNTER — Ambulatory Visit
Admission: RE | Admit: 2016-05-17 | Discharge: 2016-05-17 | Disposition: A | Discharge status: Home / Self Care | Payer: Medicaid Other | Source: Ambulatory Visit | Attending: Nephrology | Admitting: Nephrology

## 2016-05-17 DIAGNOSIS — N182 Chronic kidney disease, stage 2 (mild): Secondary | ICD-10-CM

## 2016-05-23 ENCOUNTER — Emergency Department (HOSPITAL_COMMUNITY)
Admission: EM | Admit: 2016-05-23 | Discharge: 2016-05-23 | Disposition: A | Discharge status: Home / Self Care | Payer: Medicaid Other | Attending: Emergency Medicine | Admitting: Emergency Medicine

## 2016-05-23 ENCOUNTER — Encounter (HOSPITAL_COMMUNITY): Payer: Self-pay

## 2016-05-23 DIAGNOSIS — E039 Hypothyroidism, unspecified: Secondary | ICD-10-CM | POA: Insufficient documentation

## 2016-05-23 DIAGNOSIS — F17210 Nicotine dependence, cigarettes, uncomplicated: Secondary | ICD-10-CM | POA: Insufficient documentation

## 2016-05-23 DIAGNOSIS — K0889 Other specified disorders of teeth and supporting structures: Secondary | ICD-10-CM

## 2016-05-23 MED ORDER — IBUPROFEN 600 MG PO TABS: 600.0000 mg | ORAL_TABLET | Freq: Four times a day (QID) | ORAL | 0 refills | Status: DC | PRN

## 2016-05-23 MED ORDER — TRAMADOL HCL 50 MG PO TABS: 50.0000 mg | ORAL_TABLET | Freq: Four times a day (QID) | ORAL | 0 refills | Status: DC | PRN

## 2016-05-23 MED ORDER — OXYCODONE-ACETAMINOPHEN 5-325 MG PO TABS
1.0000 | ORAL_TABLET | Freq: Once | ORAL | Status: AC
  Administered 2016-05-23: 1 via ORAL
  Filled 2016-05-23: qty 1

## 2016-05-23 NOTE — ED Triage Notes (Signed)
Per EMS, pt from home.  Pt c/o dental pain.  Had 7 teeth pulled Monday.  Pt given percocet but has run out of his meds.  Vitals:  170/80, hr 80, resp 18,

## 2016-05-23 NOTE — ED Provider Notes (Signed)
[facility]-EMERGENCY DEPT Provider Note   CSN: [id number] Arrival date & time: 05/23/16  1750     History   Chief Complaint Chief Complaint  Patient presents with  . Dental Pain    HPI [name] is a 56 y.o. male.  HPI  56 y.o. male presents to the Emergency Department today complaining of dental pain. Arrived via EMS. Seven teeth pulled on Monday. Given Rx Percocet, but ran out. No worsening symptoms. No fevers. No trouble with swallowing or PO intake. Attempted to [place] dentist, but office closed. Will call tomorrow. No fevers. No N/V. Rates pain 10/10. Has not attempted other analgesic modalities besides narcotics. No other symptoms noted.      Past Medical History:  Diagnosis Date  . Anemia   . Ascites   . Aspiration pneumonia (HCC)    HX OF  . GERD (gastroesophageal reflux disease)   . H/O diabetes insipidus   . Hepatitis C   . History of adrenal insufficiency   . History of ARDS   . History of blood transfusion   . Hx of Clostridium difficile infection   . Hypothyroidism   . Kidney disease   . Liver disease   . Overdose 12/2009  . Peripheral neuropathy (HCC)   . Personality disorder   . Pleural effusion, bilateral    HX OF  . PTSD (post-traumatic stress disorder)   . PTSD (post-traumatic stress disorder)    SECONDARY TO WAR IN [place]  . Schizoaffective disorder [facility])     Patient Active Problem List   Diagnosis Date Noted  . PTSD (post-traumatic stress disorder)   . Hypothyroidism   . History of adrenal insufficiency   . H/O diabetes insipidus   . Schizoaffective disorder (HCC) 11/08/2011  . Personality disorder   . Anemia   . Ascites   . Liver disease   . Hepatitis C   . Kidney disease   . GERD (gastroesophageal reflux disease)   . Psychosis 11/07/2011    Past Surgical History:  Procedure Laterality Date  . RIGHT HIP SURGERY  1996  . US GUIDED LEFT THORACENTESIS  01/21/2010       Home Medications    Prior to Admission medications     Medication Sig Start Date End Date Taking? Authorizing Provider  b complex vitamins capsule Take 1 capsule by mouth daily.    Historical Provider, MD  benztropine (COGENTIN) 1 MG tablet Take 1 mg by mouth 2 (two) times daily as needed for tremors.     Historical Provider, MD  Cholecalciferol (VITAMIN D-3) 1000 UNITS CAPS Take 2,000 Units by mouth daily.    Historical Provider, MD  Cholecalciferol (VITAMIN D3) 2000 units capsule TAKE 1 CAPSULE BY ORAL ROUTE DAILY FOR 90 DAYS 10/21/15   Historical Provider, MD  diclofenac (VOLTAREN) 75 MG EC tablet Take 75 mg by mouth 2 (two) times daily.    Historical Provider, MD  diphenhydrAMINE (SOMINEX) 25 MG tablet Take 25 mg by mouth at bedtime.    Historical Provider, MD  furosemide (LASIX) 20 MG tablet Take 20 mg by mouth 2 (two) times daily.    Historical Provider, MD  gabapentin (NEURONTIN) 100 MG capsule Take 200 mg by mouth 3 (three) times daily.    Historical Provider, MD  haloperidol decanoate (HALDOL DECANOATE) 100 MG/ML injection INJECT 2 ML (2 VIALS) INTRAMUSCULARLY EVERY 4 WEEKS 11/04/15   Historical Provider, MD  haloperidol lactate (HALDOL) 5 MG/ML injection every 30 (thirty) days.    Historical Provider, MD  HYDROcodone-acetaminophen (NORCO/VICODIN) 5-325 MG per tablet Take 1 tablet by mouth every 6 (six) hours as needed for moderate pain. Patient not taking: Reported on 09/18/2015 12/18/14   Elwin Mocha, MD  LACTOBACILLUS PO Take 1 capsule by mouth daily.    Historical Provider, MD  meloxicam (MOBIC) 15 MG tablet Take 1 tablet by mouth daily as needed. pain 12/11/14   Historical Provider, MD  methylPREDNISolone (MEDROL DOSEPAK) 4 MG TBPK tablet Take by mouth as directed.    Historical Provider, MD  omeprazole (PRILOSEC) 20 MG capsule Take 20 mg by mouth daily.    Historical Provider, MD  ondansetron (ZOFRAN ODT) 8 MG disintegrating tablet Take 1 tablet (8 mg total) by mouth every 8 (eight) hours as needed for nausea or vomiting. 11/14/15   Azalia Bilis, MD  oxyCODONE-acetaminophen (PERCOCET) 7.5-325 MG tablet Take 1 tablet by mouth every 6 (six) hours as needed for severe pain. Patient not taking: Reported on 09/18/2015 04/17/15   Melton Krebs, PA-C  rifaximin (XIFAXAN) 550 MG TABS tablet Take 550 mg by mouth 2 (two) times daily.    Historical Provider, MD  spironolactone (ALDACTONE) 50 MG tablet Take 50 mg by mouth daily.    Historical Provider, MD    Family History History reviewed. No pertinent family history.  Social History Social History  Substance Use Topics  . Smoking status: Current Every Day Smoker    Packs/day: 1.00    Years: 25.00    Types: Cigarettes  . Smokeless tobacco: Never Used  . Alcohol use No     Comment: Pt denies      Allergies   Heparin; Imipramine; Lamictal [lamotrigine]; and Penicillins   Review of Systems Review of Systems  Constitutional: Negative for fever.  HENT: Positive for dental problem.    Physical Exam Updated Vital Signs BP 132/77 (BP Location: Left Arm)   Pulse 77   Temp 98.6 F (37 C) (Oral)   Resp 18   SpO2 97%   Physical Exam  Constitutional: He is oriented to person, place, and time. Vital signs are normal. He appears well-developed and well-nourished.  HENT:  Head: Normocephalic and atraumatic.  Right Ear: Hearing normal.  Left Ear: Hearing normal.  Poor dentition noted. Missing multiple teeth. No trismus. No dental abscess. No swelling or erythema.   Eyes: Conjunctivae and EOM are normal. Pupils are equal, round, and reactive to light.  Neck: Normal range of motion.  Cardiovascular: Normal rate and regular rhythm.   Pulmonary/Chest: Effort normal.  Neurological: He is alert and oriented to person, place, and time.  Skin: Skin is warm and dry.  Psychiatric: He has a normal mood and affect. His speech is normal and behavior is normal. Thought content normal.  Nursing note and vitals reviewed.  ED Treatments / Results  Labs (all labs ordered are  listed, but only abnormal results are displayed) Labs Reviewed - No data to display  EKG  EKG Interpretation None       Radiology No results found.  Procedures Procedures (including critical care time)  Medications Ordered in ED Medications - No data to display   Initial Impression / Assessment and Plan / ED Course  I have reviewed the triage vital signs and the nursing notes.  Pertinent labs & imaging results that were available during my care of the patient were reviewed by me and considered in my medical decision making (see chart for details).  Final Clinical Impressions(s) / ED Diagnoses      {I  have reviewed EMS Documentation. {I obtained HPI from historian.   ED Course:  Assessment: Dental pain associated with dental cary but no signs or symptoms of dental abscess with patient afebrile, non toxic appearing and swallowing secretions well. Exam unconcerning for Ludwig's angina or other deep tissue infection in neck.  As there is no facial swelling or gum findings, will not prescribe antibiotics at this time. Will treat with pain medication.  I gave patient referral to dentist and stressed the importance of dental follow up for ultimate management of dental pain. Patient voices understanding and is agreeable to plan.  Disposition/Plan:  DC Home Additional Verbal discharge instructions given and discussed with patient.  Pt Instructed to f/u with Dentist in the next week for evaluation and treatment of symptoms. Return precautions given Pt acknowledges and agrees with plan  Supervising Physician Azalia Bilis, MD  Final diagnoses:  Pain, dental    New Prescriptions New Prescriptions   No medications on file     Audry Pili, PA-C 05/23/16 1906    Azalia Bilis, MD 05/24/16 [phone]

## 2016-05-23 NOTE — Discharge Instructions (Signed)
Please read and follow all provided instructions.  Your diagnoses today include:  1. Pain, dental     Tests performed today include: Vital signs. See below for your results today.   Medications prescribed:  Take as prescribed   Home care instructions:  Follow any educational materials contained in this packet.  Follow-up instructions: Please follow-up with your Dentist for further evaluation of symptoms and treatment   Return instructions:  Please return to the Emergency Department if you do not get better, if you get worse, or new symptoms OR  - Fever (temperature greater than 101.33F)  - Bleeding that does not stop with holding pressure to the area    -Severe pain (please note that you may be more sore the day after your accident)  - Chest Pain  - Difficulty breathing  - Severe nausea or vomiting  - Inability to tolerate food and liquids  - Passing out  - Skin becoming red around your wounds  - Change in mental status (confusion or lethargy)  - New numbness or weakness    Please return if you have any other emergent concerns.  Additional Information:  Your vital signs today were: BP 132/77 (BP Location: Left Arm)    Pulse 77    Temp 98.6 F (37 C) (Oral)    Resp 18    SpO2 97%  If your blood pressure (BP) was elevated above 135/85 this visit, please have this repeated by your doctor within one month. ---------------

## 2016-08-05 ENCOUNTER — Other Ambulatory Visit (HOSPITAL_COMMUNITY): Payer: Self-pay | Admitting: Nephrology

## 2016-08-05 DIAGNOSIS — N2581 Secondary hyperparathyroidism of renal origin: Secondary | ICD-10-CM

## 2016-08-16 ENCOUNTER — Other Ambulatory Visit: Payer: Self-pay | Admitting: Nurse Practitioner

## 2016-08-16 DIAGNOSIS — B182 Chronic viral hepatitis C: Secondary | ICD-10-CM

## 2016-08-19 ENCOUNTER — Encounter (HOSPITAL_COMMUNITY)
Admission: RE | Admit: 2016-08-19 | Discharge: 2016-08-19 | Disposition: A | Discharge status: Home / Self Care | Payer: Medicaid Other | Source: Ambulatory Visit | Attending: Nephrology | Admitting: Nephrology

## 2016-08-19 DIAGNOSIS — N2581 Secondary hyperparathyroidism of renal origin: Secondary | ICD-10-CM | POA: Insufficient documentation

## 2016-08-19 MED ORDER — TECHNETIUM TC 99M SESTAMIBI GENERIC - CARDIOLITE
25.0000 | Freq: Once | INTRAVENOUS | Status: AC | PRN
  Administered 2016-08-19: 25 via INTRAVENOUS

## 2016-08-27 ENCOUNTER — Ambulatory Visit
Admission: RE | Admit: 2016-08-27 | Discharge: 2016-08-27 | Disposition: A | Discharge status: Home / Self Care | Payer: Medicaid Other | Source: Ambulatory Visit | Attending: Nurse Practitioner | Admitting: Nurse Practitioner

## 2016-08-27 DIAGNOSIS — B182 Chronic viral hepatitis C: Secondary | ICD-10-CM

## 2017-06-30 ENCOUNTER — Other Ambulatory Visit: Payer: Self-pay | Admitting: Nurse Practitioner

## 2017-06-30 DIAGNOSIS — K7469 Other cirrhosis of liver: Secondary | ICD-10-CM

## 2017-07-07 ENCOUNTER — Inpatient Hospital Stay
Admission: RE | Admit: 2017-07-07 | Discharge: 2017-07-07 | Disposition: A | Discharge status: Home / Self Care | Payer: Self-pay | Source: Ambulatory Visit | Attending: Nurse Practitioner | Admitting: Nurse Practitioner

## 2017-07-13 ENCOUNTER — Ambulatory Visit
Admission: RE | Admit: 2017-07-13 | Discharge: 2017-07-13 | Disposition: A | Discharge status: Home / Self Care | Payer: Medicaid Other | Source: Ambulatory Visit | Attending: Nurse Practitioner | Admitting: Nurse Practitioner

## 2017-07-13 DIAGNOSIS — K7469 Other cirrhosis of liver: Secondary | ICD-10-CM

## 2017-09-08 ENCOUNTER — Encounter (HOSPITAL_COMMUNITY): Payer: Self-pay | Admitting: Emergency Medicine

## 2017-09-08 ENCOUNTER — Emergency Department (HOSPITAL_COMMUNITY)
Admission: EM | Admit: 2017-09-08 | Discharge: 2017-09-08 | Disposition: A | Discharge status: Home / Self Care | Payer: Medicaid Other | Attending: Emergency Medicine | Admitting: Emergency Medicine

## 2017-09-08 ENCOUNTER — Other Ambulatory Visit: Payer: Self-pay

## 2017-09-08 DIAGNOSIS — E232 Diabetes insipidus: Secondary | ICD-10-CM | POA: Insufficient documentation

## 2017-09-08 DIAGNOSIS — E039 Hypothyroidism, unspecified: Secondary | ICD-10-CM | POA: Diagnosis not present

## 2017-09-08 DIAGNOSIS — M79604 Pain in right leg: Secondary | ICD-10-CM | POA: Diagnosis not present

## 2017-09-08 DIAGNOSIS — F17210 Nicotine dependence, cigarettes, uncomplicated: Secondary | ICD-10-CM | POA: Diagnosis not present

## 2017-09-08 DIAGNOSIS — M79605 Pain in left leg: Secondary | ICD-10-CM | POA: Insufficient documentation

## 2017-09-08 DIAGNOSIS — Z79899 Other long term (current) drug therapy: Secondary | ICD-10-CM | POA: Diagnosis not present

## 2017-09-08 MED ORDER — OXYCODONE HCL 5 MG PO TABS
5.0000 mg | ORAL_TABLET | Freq: Once | ORAL | Status: AC
  Administered 2017-09-08: 5 mg via ORAL
  Filled 2017-09-08: qty 1

## 2017-09-08 MED ORDER — HYDROCODONE-ACETAMINOPHEN 5-325 MG PO TABS: 2.0000 | ORAL_TABLET | Freq: Once | ORAL | Status: DC

## 2017-09-08 MED ORDER — OXYCODONE HCL 5 MG PO TABS: 5.0000 mg | ORAL_TABLET | Freq: Four times a day (QID) | ORAL | 0 refills | Status: DC | PRN

## 2017-09-08 NOTE — ED Provider Notes (Signed)
[facility]-EMERGENCY DEPT Provider Note   CSN: [id number] Arrival date & time: 09/08/17  0046     History   Chief Complaint Chief Complaint  Patient presents with  . Leg Pain    denies event or injury    HPI Wheeler Incorvaia is a 57 y.o. male.  The history is provided by the patient and medical records.  Leg Pain      57 year old male with history of anemia, ascites, GERD, diabetes insipidus, hepatitis C, hypothyroidism, peripheral neuropathy, personality disorder, PTSD, schizoaffective disorder, presenting to the ED with bilateral leg pain.  Patient has a long-standing history of leg pain.  States he did do physical therapy and seemed to get some improvement but of the past few months and started having worsening pain again.  He denies any recent injury, trauma, or falls.  He was given some medication by his primary care doctor (tylenol #3) which seemed to help but has since run out.  He was sent to orthopedist recently, was told he did have some arthritis but his bones looked ok.  They have set him up with a neurologist as they felt it was more a "nerve issue" but cannot see them until august.  Patient states at times his feet fall asleep and if he is up and walking for long periods of time he gets a lot of burning pain in his legs and his feet especially.  He denies any focal numbness or weakness.  Legs do not give out on him.  No swelling, redness, color changes, or rashes of the legs.  No fever/chills.  Patient states he woke up at 2am and pain was out of control so he asked to come to the hospital.  He is requesting some pain control until he cant get back in with his PCP.  Past Medical History:  Diagnosis Date  . Anemia   . Ascites   . Aspiration pneumonia (HCC)    HX OF  . GERD (gastroesophageal reflux disease)   . H/O diabetes insipidus   . Hepatitis C   . History of adrenal insufficiency   . History of ARDS   . History of blood transfusion   . Hx of  Clostridium difficile infection   . Hypothyroidism   . Kidney disease   . Liver disease   . Overdose 12/2009  . Peripheral neuropathy   . Personality disorder (HCC)   . Pleural effusion, bilateral    HX OF  . PTSD (post-traumatic stress disorder)   . PTSD (post-traumatic stress disorder)    SECONDARY TO WAR IN [place]  . Schizoaffective disorder [facility])     Patient Active Problem List   Diagnosis Date Noted  . PTSD (post-traumatic stress disorder)   . Hypothyroidism   . History of adrenal insufficiency   . H/O diabetes insipidus   . Schizoaffective disorder (HCC) 11/08/2011  . Personality disorder (HCC)   . Anemia   . Ascites   . Liver disease   . Hepatitis C   . Kidney disease   . GERD (gastroesophageal reflux disease)   . Psychosis (HCC) 11/07/2011    Past Surgical History:  Procedure Laterality Date  . RIGHT HIP SURGERY  1996  . US GUIDED LEFT THORACENTESIS  01/21/2010        Home Medications    Prior to Admission medications   Medication Sig Start Date End Date Taking? Authorizing Provider  benztropine (COGENTIN) 1 MG tablet Take 1 mg by mouth 2 (two) times  daily as needed for tremors.    Yes [provider]  furosemide (LASIX) 20 MG tablet Take 20 mg by mouth daily.    Yes [provider]  gabapentin (NEURONTIN) 100 MG capsule Take 200 mg by mouth 3 (three) times daily.   Yes [provider]  SENSIPAR 30 MG tablet Take 30 mg by mouth daily. 07/25/17  Yes [provider]  trihexyphenidyl (ARTANE) 2 MG tablet Take 2 mg by mouth 3 (three) times daily. 08/29/17  Yes [provider]  HYDROcodone-acetaminophen (NORCO/VICODIN) 5-325 MG per tablet Take 1 tablet by mouth every 6 (six) hours as needed for moderate pain. Patient not taking: Reported on 09/18/2015 12/18/14   Elwin Mocha, MD  ibuprofen (ADVIL,MOTRIN) 600 MG tablet Take 1 tablet (600 mg total) by mouth every 6 (six) hours as needed. Patient not taking: Reported on  09/08/2017 05/23/16   Audry Pili, PA-C  ondansetron (ZOFRAN ODT) 8 MG disintegrating tablet Take 1 tablet (8 mg total) by mouth every 8 (eight) hours as needed for nausea or vomiting. Patient not taking: Reported on 09/08/2017 11/14/15   Azalia Bilis, MD  oxyCODONE-acetaminophen (PERCOCET) 7.5-325 MG tablet Take 1 tablet by mouth every 6 (six) hours as needed for severe pain. Patient not taking: Reported on 09/18/2015 04/17/15   Melton Krebs, PA-C  traMADol (ULTRAM) 50 MG tablet Take 1 tablet (50 mg total) by mouth every 6 (six) hours as needed. Patient not taking: Reported on 09/08/2017 05/23/16   Audry Pili, PA-C    Family History History reviewed. No pertinent family history.  Social History Social History   Tobacco Use  . Smoking status: Current Every Day Smoker    Packs/day: 1.00    Years: 25.00    Pack years: 25.00    Types: Cigarettes  . Smokeless tobacco: Never Used  Substance Use Topics  . Alcohol use: No    Comment: Pt denies   . Drug use: No    Comment: Pt denies     Allergies   Heparin; Imipramine; Lamictal [lamotrigine]; and Penicillins   Review of Systems Review of Systems  Musculoskeletal: Positive for arthralgias and myalgias.  All other systems reviewed and are negative.    Physical Exam Updated Vital Signs BP 105/72 (BP Location: Right Arm)   Pulse 74   Temp 98.1 F (36.7 C) (Oral)   Resp 20   SpO2 96%   Physical Exam  Constitutional: He is oriented to person, place, and time. He appears well-developed and well-nourished.  HENT:  Head: Normocephalic and atraumatic.  Mouth/Throat: Oropharynx is clear and moist.  Eyes: Pupils are equal, round, and reactive to light. Conjunctivae and EOM are normal.  Neck: Normal range of motion.  Cardiovascular: Normal rate, regular rhythm and normal heart sounds.  Pulmonary/Chest: Effort normal and breath sounds normal. No respiratory distress.  Abdominal: Soft. Bowel sounds are normal. There is no  tenderness. There is no rebound.  Musculoskeletal: Normal range of motion.  Both legs normal in appearance, some decreased muscle tone throughout; well healed scar of right hip; no overlying erythema, tenderness, or palpable cords; no focal tenderness or deformities; skin clear and intact; normal strength of both legs with dorsi and plantar flexion, some slight weakness when lifting each leg off the bed which patient reports is baseline, ambulatory with cane which is baseline  Neurological: He is alert and oriented to person, place, and time.  Skin: Skin is warm and dry.  Psychiatric: He has a normal mood and affect.  Nursing note and vitals reviewed.    ED Treatments / Results  Labs (all labs ordered are listed, but only abnormal results are displayed) Labs Reviewed - No data to display  EKG None  Radiology No results found.  Procedures Procedures (including critical care time)  Medications Ordered in ED Medications  oxyCODONE (Oxy IR/ROXICODONE) immediate release tablet 5 mg (5 mg Oral Given 09/08/17 0525)     Initial Impression / Assessment and Plan / ED Course  I have reviewed the triage vital signs and the nursing notes.  Pertinent labs & imaging results that were available during my care of the patient were reviewed by me and considered in my medical decision making (see chart for details).  57 year old male here with bilateral leg pain.  This is somewhat of a chronic issue, described as intense, burning pain in the legs with some occasional tingling sensation in the feet.  After talking with him, it seems mostly is here for pain control.  Has completed physical therapy, evaluation by orthopedics, and trial of medications by his primary care doctor with some improvement.  He is scheduled to see a neurologist in August as they feel a lot of his symptoms are due to "nerve issues".  On exam, legs are overall normal in appearance bilaterally.  He does have some decreased muscle  tone diffusely but is able to plantar/dorsiflex against resistance without issue.  Some difficulty with raising his legs up off the bed which he reports is baseline.  Normal sensation distally.  No bony deformities, swelling, overlying skin changes, rashes, or palpable cords.  No signs of DVT on exam.  Legs are NVI.  Ambulates with cane which is baseline.  After talking with him, nature of his pain seems more due to peripheral neuropathy.  Per database, patient has only had 1 narcotic prescription thus far this year so will give short supply for pain control until he can follow-up with his primary care doctor.  He understands need for close follow-up.  Discussed plan with patient, he/she acknowledged understanding and agreed with plan of care.  Return precautions given for new or worsening symptoms.  Final Clinical Impressions(s) / ED Diagnoses   Final diagnoses:  Pain in both lower extremities    ED Discharge Orders        Ordered    oxyCODONE (OXY IR/ROXICODONE) 5 MG immediate release tablet  Every 6 hours PRN     09/08/17 0551       Garlon Hatchet, PA-C 09/08/17 4825    Dione Booze, MD 09/08/17 [phone]

## 2017-09-08 NOTE — ED Notes (Signed)
Discharge instructions reviewed with pt. Pt verbalized understanding. Pt to follow up with PCP. Pt assisted to waiting room via wheelchair.

## 2017-09-08 NOTE — Discharge Instructions (Signed)
Take the prescribed medication as directed.  I would try taking some motrin between doses of this medication to help as well. Follow-up with your primary care doctor. Return to the ED for new or worsening symptoms.

## 2017-09-20 ENCOUNTER — Emergency Department (HOSPITAL_COMMUNITY)
Admission: EM | Admit: 2017-09-20 | Discharge: 2017-09-20 | Disposition: A | Discharge status: Home / Self Care | Payer: Medicaid Other | Attending: Emergency Medicine | Admitting: Emergency Medicine

## 2017-09-20 ENCOUNTER — Other Ambulatory Visit: Payer: Self-pay

## 2017-09-20 ENCOUNTER — Encounter (HOSPITAL_COMMUNITY): Payer: Self-pay | Admitting: Emergency Medicine

## 2017-09-20 DIAGNOSIS — Z79899 Other long term (current) drug therapy: Secondary | ICD-10-CM | POA: Diagnosis not present

## 2017-09-20 DIAGNOSIS — M79605 Pain in left leg: Secondary | ICD-10-CM | POA: Insufficient documentation

## 2017-09-20 DIAGNOSIS — G8929 Other chronic pain: Secondary | ICD-10-CM | POA: Insufficient documentation

## 2017-09-20 DIAGNOSIS — F17210 Nicotine dependence, cigarettes, uncomplicated: Secondary | ICD-10-CM | POA: Insufficient documentation

## 2017-09-20 DIAGNOSIS — M79604 Pain in right leg: Secondary | ICD-10-CM | POA: Insufficient documentation

## 2017-09-20 HISTORY — DX: Other chronic pain: G89.29

## 2017-09-20 HISTORY — DX: Pain in leg, unspecified: M79.606

## 2017-09-20 MED ORDER — OXYCODONE HCL 5 MG PO TABS
5.0000 mg | ORAL_TABLET | Freq: Once | ORAL | Status: AC
Start: 2017-09-20 — End: 2017-09-20
  Administered 2017-09-20: 5 mg via ORAL
  Filled 2017-09-20: qty 1

## 2017-09-20 NOTE — Discharge Instructions (Signed)
Take your usual prescriptions as previously directed.  Call your regular medical doctor today to schedule a follow up appointment within the next 2 days.  Return to the Emergency Department immediately sooner if worsening.  ° °

## 2017-09-20 NOTE — ED Triage Notes (Signed)
Pt arriving with chronic leg pain. Pt states he has been out of his medications (Oxycodone) since Sunday. Pt usually sees provider at pain clinic.

## 2017-09-20 NOTE — ED Provider Notes (Signed)
[facility]-EMERGENCY DEPT Provider Note   CSN: [id number] Arrival date & time: 09/20/17  0131     History   Chief Complaint Chief Complaint  Patient presents with  . Leg Pain    HPI [name] is a 57 y.o. male.  HPI  Pt was seen at 0720. Per pt, c/o gradual onset and persistence of constant acute flair of his chronic bilateral legs "pain" for the past several years.  Denies any change in his usual chronic pain pattern.  Pain worsened when he ran out of his pain medications 2 days ago. Pt states he came to the ED to get a refill of his pain medication (oxycodone). Pt states he has been extensively evaluated by his PMD and Ortho MD, as well as attended PT. States he is due to see a Neurologist in the next few months. States his doctor's feel his pain is "a nerve issue." Denies incont/retention of bowel or bladder, no saddle anesthesia, no focal motor weakness, no tingling/numbness in extremities, no fevers, no injury, no back pain, no neck pain, no abd pain, no CP/SOB, no rash, no calf pain or unilateral swelling.   The symptoms have been associated with no other complaints. The patient has a significant history of similar symptoms previously, recently being evaluated for this complaint and multiple prior evals for same.    Past Medical History:  Diagnosis Date  . Anemia   . Ascites   . Aspiration pneumonia (HCC)    HX OF  . Chronic leg pain   . GERD (gastroesophageal reflux disease)   . H/O diabetes insipidus   . Hepatitis C   . History of adrenal insufficiency   . History of ARDS   . History of blood transfusion   . Hx of Clostridium difficile infection   . Hypothyroidism   . Kidney disease   . Liver disease   . Overdose 12/2009  . Peripheral neuropathy   . Personality disorder (HCC)   . Pleural effusion, bilateral    HX OF  . PTSD (post-traumatic stress disorder)   . PTSD (post-traumatic stress disorder)    SECONDARY TO WAR IN [place]  .  Schizoaffective disorder [facility])     Patient Active Problem List   Diagnosis Date Noted  . PTSD (post-traumatic stress disorder)   . Hypothyroidism   . History of adrenal insufficiency   . H/O diabetes insipidus   . Schizoaffective disorder (HCC) 11/08/2011  . Personality disorder (HCC)   . Anemia   . Ascites   . Liver disease   . Hepatitis C   . Kidney disease   . GERD (gastroesophageal reflux disease)   . Psychosis (HCC) 11/07/2011    Past Surgical History:  Procedure Laterality Date  . RIGHT HIP SURGERY  1996  . US GUIDED LEFT THORACENTESIS  01/21/2010        Home Medications    Prior to Admission medications   Medication Sig Start Date End Date Taking? Authorizing Provider  furosemide (LASIX) 20 MG tablet Take 20 mg by mouth daily.    Yes [provider]  gabapentin (NEURONTIN) 100 MG capsule Take 200 mg by mouth 3 (three) times daily.   Yes [provider]  oxyCODONE (OXY IR/ROXICODONE) 5 MG immediate release tablet Take 1 tablet (5 mg total) by mouth every 6 (six) hours as needed for severe pain. 09/08/17  Yes Garlon Hatchet, PA-C  SENSIPAR 30 MG tablet Take 30 mg by mouth daily. 07/25/17  Yes  [provider]  trihexyphenidyl (ARTANE) 2 MG tablet Take 2 mg by mouth 3 (three) times daily. 08/29/17  Yes [provider]  benztropine (COGENTIN) 1 MG tablet Take 1 mg by mouth 2 (two) times daily as needed for tremors.     [provider]  HYDROcodone-acetaminophen (NORCO/VICODIN) 5-325 MG per tablet Take 1 tablet by mouth every 6 (six) hours as needed for moderate pain. Patient not taking: Reported on 09/18/2015 12/18/14   Elwin Mocha, MD  ibuprofen (ADVIL,MOTRIN) 600 MG tablet Take 1 tablet (600 mg total) by mouth every 6 (six) hours as needed. Patient not taking: Reported on 09/08/2017 05/23/16   Audry Pili, PA-C  ondansetron (ZOFRAN ODT) 8 MG disintegrating tablet Take 1 tablet (8 mg total) by mouth every 8 (eight) hours as needed  for nausea or vomiting. Patient not taking: Reported on 09/08/2017 11/14/15   Azalia Bilis, MD  oxyCODONE-acetaminophen (PERCOCET) 7.5-325 MG tablet Take 1 tablet by mouth every 6 (six) hours as needed for severe pain. Patient not taking: Reported on 09/18/2015 04/17/15   Melton Krebs, PA-C  traMADol (ULTRAM) 50 MG tablet Take 1 tablet (50 mg total) by mouth every 6 (six) hours as needed. Patient not taking: Reported on 09/08/2017 05/23/16   Audry Pili, PA-C    Family History No family history on file.  Social History Social History   Tobacco Use  . Smoking status: Current Every Day Smoker    Packs/day: 1.00    Years: 25.00    Pack years: 25.00    Types: Cigarettes  . Smokeless tobacco: Never Used  Substance Use Topics  . Alcohol use: No    Comment: Pt denies   . Drug use: No    Comment: Pt denies     Allergies   Heparin; Imipramine; Lamictal [lamotrigine]; and Penicillins   Review of Systems Review of Systems ROS: Statement: All systems negative except as marked or noted in the HPI; Constitutional: Negative for fever and chills. ; ; Eyes: Negative for eye pain, redness and discharge. ; ; ENMT: Negative for ear pain, hoarseness, nasal congestion, sinus pressure and sore throat. ; ; Cardiovascular: Negative for chest pain, palpitations, diaphoresis, dyspnea and peripheral edema. ; ; Respiratory: Negative for cough, wheezing and stridor. ; ; Gastrointestinal: Negative for nausea, vomiting, diarrhea, abdominal pain, blood in stool, hematemesis, jaundice and rectal bleeding. . ; ; Genitourinary: Negative for dysuria, flank pain and hematuria. ; ; Musculoskeletal: +chronic legs pain. Negative for back pain and neck pain. Negative for swelling and trauma.; ; Skin: Negative for pruritus, rash, abrasions, blisters, bruising and skin lesion.; ; Neuro: Negative for headache, lightheadedness and neck stiffness. Negative for weakness, altered level of consciousness, altered mental  status, extremity weakness, paresthesias, involuntary movement, seizure and syncope.       Physical Exam Updated Vital Signs BP 116/64 (BP Location: Left Arm)   Pulse 65   Temp 97.7 F (36.5 C)   Resp 18   Ht 6\' 6"  (1.981 m)   Wt 113.4 kg (250 lb)   SpO2 100%   BMI 28.89 kg/m   Physical Exam 0725: Physical examination:  Nursing notes reviewed; Vital signs and O2 SAT reviewed;  Constitutional: Well developed, Well nourished, Well hydrated, In no acute distress; Head:  Normocephalic, atraumatic; Eyes: EOMI, PERRL, No scleral icterus; ENMT: Mouth and pharynx normal, Mucous membranes moist; Neck: Supple, Full range of motion, No lymphadenopathy; Cardiovascular: Regular rate and rhythm, No gallop; Respiratory: Breath sounds clear & equal bilaterally, No wheezes.  Speaking full sentences with ease, Normal respiratory effort/excursion; Chest: Nontender, Movement normal; Abdomen: Soft, Nontender, Nondistended, Normal bowel sounds; Genitourinary: No CVA tenderness; Spine:  No midline CS, TS, LS tenderness.;; Extremities: Peripheral pulses normal, No deformity. Muscles compartments soft.  No edema, No calf tenderness, edema or asymmetry.; Neuro: AA&Ox3, Major CN grossly intact.  Speech clear. No gross focal motor or sensory deficits in extremities. Climbs on and off stretcher easily by himself. Gait steady with cane (baseline).; Skin: Color normal, Warm, Dry.   ED Treatments / Results  Labs (all labs ordered are listed, but only abnormal results are displayed)   EKG None  Radiology   Procedures Procedures (including critical care time)  Medications Ordered in ED Medications  oxyCODONE (Oxy IR/ROXICODONE) immediate release tablet 5 mg (has no administration in time range)     Initial Impression / Assessment and Plan / ED Course  I have reviewed the triage vital signs and the nursing notes.  Pertinent labs & imaging results that were available during my care of the patient were  reviewed by me and considered in my medical decision making (see chart for details).  MDM Reviewed: previous chart, nursing note and vitals     0735:  Pt states he is here for a refill of his narcotic pain medication for his chronic bilat LE's pain; which working DDx appears to be peripheral neuropathy vs arthritis. [place] Controlled Substance Database accessed:  Pt has received 3 different narcotic rx, written by 2 different providers in the past 3 weeks. Pt informed he will receive a dose of pain meds while in the ED, but will not be receiving a rx narcotic, as the ED is not the appropriate venue to refill narcotic rx.  Long hx of chronic pain with multiple ED visits for same.  Pt endorses acute flair of his usual long standing chronic pain today, no change from his usual chronic pain pattern.  Pt encouraged to f/u with his PMD and Pain Management doctor for good continuity of care and control of his chronic pain.  Pt verb understanding.    Final Clinical Impressions(s) / ED Diagnoses   Final diagnoses:  None    ED Discharge Orders    None       Samuel Jester, DO 09/23/17 1308

## 2017-10-15 ENCOUNTER — Other Ambulatory Visit: Payer: Self-pay

## 2017-10-15 ENCOUNTER — Inpatient Hospital Stay (HOSPITAL_COMMUNITY)
Admission: EM | Admit: 2017-10-15 | Discharge: 2017-10-18 | DRG: 378 | Disposition: A | Discharge status: Home / Self Care | Payer: Medicaid Other | Attending: Pulmonary Disease | Admitting: Pulmonary Disease

## 2017-10-15 ENCOUNTER — Encounter (HOSPITAL_COMMUNITY)
Admission: EM | Disposition: A | Discharge status: Home / Self Care | Payer: Self-pay | Source: Home / Self Care | Attending: Pulmonary Disease

## 2017-10-15 ENCOUNTER — Emergency Department (HOSPITAL_COMMUNITY): Payer: Medicaid Other

## 2017-10-15 ENCOUNTER — Encounter (HOSPITAL_COMMUNITY): Payer: Self-pay | Admitting: Pulmonary Disease

## 2017-10-15 DIAGNOSIS — D689 Coagulation defect, unspecified: Secondary | ICD-10-CM | POA: Diagnosis present

## 2017-10-15 DIAGNOSIS — K766 Portal hypertension: Secondary | ICD-10-CM | POA: Diagnosis not present

## 2017-10-15 DIAGNOSIS — K920 Hematemesis: Secondary | ICD-10-CM | POA: Diagnosis not present

## 2017-10-15 DIAGNOSIS — D62 Acute posthemorrhagic anemia: Secondary | ICD-10-CM | POA: Diagnosis present

## 2017-10-15 DIAGNOSIS — F319 Bipolar disorder, unspecified: Secondary | ICD-10-CM | POA: Diagnosis present

## 2017-10-15 DIAGNOSIS — R1084 Generalized abdominal pain: Secondary | ICD-10-CM

## 2017-10-15 DIAGNOSIS — F4312 Post-traumatic stress disorder, chronic: Secondary | ICD-10-CM | POA: Diagnosis present

## 2017-10-15 DIAGNOSIS — G8929 Other chronic pain: Secondary | ICD-10-CM | POA: Diagnosis present

## 2017-10-15 DIAGNOSIS — K769 Liver disease, unspecified: Secondary | ICD-10-CM | POA: Diagnosis present

## 2017-10-15 DIAGNOSIS — Z008 Encounter for other general examination: Secondary | ICD-10-CM | POA: Diagnosis not present

## 2017-10-15 DIAGNOSIS — D638 Anemia in other chronic diseases classified elsewhere: Secondary | ICD-10-CM | POA: Diagnosis present

## 2017-10-15 DIAGNOSIS — F259 Schizoaffective disorder, unspecified: Secondary | ICD-10-CM | POA: Diagnosis present

## 2017-10-15 DIAGNOSIS — I9589 Other hypotension: Secondary | ICD-10-CM

## 2017-10-15 DIAGNOSIS — E2740 Unspecified adrenocortical insufficiency: Secondary | ICD-10-CM | POA: Diagnosis present

## 2017-10-15 DIAGNOSIS — E039 Hypothyroidism, unspecified: Secondary | ICD-10-CM | POA: Diagnosis present

## 2017-10-15 DIAGNOSIS — E1142 Type 2 diabetes mellitus with diabetic polyneuropathy: Secondary | ICD-10-CM | POA: Diagnosis present

## 2017-10-15 DIAGNOSIS — E1122 Type 2 diabetes mellitus with diabetic chronic kidney disease: Secondary | ICD-10-CM | POA: Diagnosis present

## 2017-10-15 DIAGNOSIS — D696 Thrombocytopenia, unspecified: Secondary | ICD-10-CM | POA: Diagnosis not present

## 2017-10-15 DIAGNOSIS — K219 Gastro-esophageal reflux disease without esophagitis: Secondary | ICD-10-CM | POA: Diagnosis present

## 2017-10-15 DIAGNOSIS — F17210 Nicotine dependence, cigarettes, uncomplicated: Secondary | ICD-10-CM | POA: Diagnosis present

## 2017-10-15 DIAGNOSIS — N183 Chronic kidney disease, stage 3 (moderate): Secondary | ICD-10-CM | POA: Diagnosis present

## 2017-10-15 DIAGNOSIS — I700 Atherosclerosis of aorta: Secondary | ICD-10-CM | POA: Diagnosis present

## 2017-10-15 DIAGNOSIS — K7460 Unspecified cirrhosis of liver: Secondary | ICD-10-CM | POA: Diagnosis present

## 2017-10-15 DIAGNOSIS — K922 Gastrointestinal hemorrhage, unspecified: Secondary | ICD-10-CM | POA: Diagnosis present

## 2017-10-15 DIAGNOSIS — D6959 Other secondary thrombocytopenia: Secondary | ICD-10-CM | POA: Diagnosis present

## 2017-10-15 DIAGNOSIS — B1920 Unspecified viral hepatitis C without hepatic coma: Secondary | ICD-10-CM

## 2017-10-15 DIAGNOSIS — D688 Other specified coagulation defects: Secondary | ICD-10-CM | POA: Diagnosis not present

## 2017-10-15 DIAGNOSIS — F609 Personality disorder, unspecified: Secondary | ICD-10-CM | POA: Diagnosis present

## 2017-10-15 DIAGNOSIS — K625 Hemorrhage of anus and rectum: Secondary | ICD-10-CM

## 2017-10-15 DIAGNOSIS — K7469 Other cirrhosis of liver: Secondary | ICD-10-CM

## 2017-10-15 DIAGNOSIS — Z87898 Personal history of other specified conditions: Secondary | ICD-10-CM

## 2017-10-15 DIAGNOSIS — R188 Other ascites: Secondary | ICD-10-CM | POA: Diagnosis present

## 2017-10-15 DIAGNOSIS — K921 Melena: Principal | ICD-10-CM | POA: Diagnosis present

## 2017-10-15 DIAGNOSIS — Z8719 Personal history of other diseases of the digestive system: Secondary | ICD-10-CM

## 2017-10-15 DIAGNOSIS — D649 Anemia, unspecified: Secondary | ICD-10-CM

## 2017-10-15 HISTORY — DX: Unspecified cirrhosis of liver: K74.60

## 2017-10-15 LAB — PROTIME-INR
INR: 2.07
Prothrombin Time: 23.2 seconds — ABNORMAL HIGH (ref 11.4–15.2)

## 2017-10-15 LAB — COMPREHENSIVE METABOLIC PANEL
ALK PHOS: 47 U/L (ref 38–126)
ALT: 11 U/L (ref 0–44)
AST: 21 U/L (ref 15–41)
Albumin: 2.5 g/dL — ABNORMAL LOW (ref 3.5–5.0)
Anion gap: 7 (ref 5–15)
BILIRUBIN TOTAL: 1.5 mg/dL — AB (ref 0.3–1.2)
BUN: 46 mg/dL — ABNORMAL HIGH (ref 6–20)
CO2: 25 mmol/L (ref 22–32)
CREATININE: 2.42 mg/dL — AB (ref 0.61–1.24)
Calcium: 8.1 mg/dL — ABNORMAL LOW (ref 8.9–10.3)
Chloride: 114 mmol/L — ABNORMAL HIGH (ref 98–111)
GFR calc Af Amer: 33 mL/min — ABNORMAL LOW (ref >60)
GFR, EST NON AFRICAN AMERICAN: 28 mL/min — AB (ref >60)
GLUCOSE: 110 mg/dL — AB (ref 70–99)
Potassium: 4.6 mmol/L (ref 3.5–5.1)
Sodium: 146 mmol/L — ABNORMAL HIGH (ref 135–145)
TOTAL PROTEIN: 4.7 g/dL — AB (ref 6.5–8.1)

## 2017-10-15 LAB — HEMOGLOBIN AND HEMATOCRIT, BLOOD
HCT: 20.4 % — ABNORMAL LOW (ref 39.0–52.0)
Hemoglobin: 7 g/dL — ABNORMAL LOW (ref 13.0–17.0)

## 2017-10-15 LAB — CBC
HCT: 24.2 % — ABNORMAL LOW (ref 39.0–52.0)
Hemoglobin: 8.4 g/dL — ABNORMAL LOW (ref 13.0–17.0)
MCH: 34.3 pg — ABNORMAL HIGH (ref 26.0–34.0)
MCHC: 34.7 g/dL (ref 30.0–36.0)
MCV: 98.8 fL (ref 78.0–100.0)
PLATELETS: 229 10*3/uL (ref 150–400)
RBC: 2.45 MIL/uL — ABNORMAL LOW (ref 4.22–5.81)
RDW: 15 % (ref 11.5–15.5)
WBC: 13.7 10*3/uL — ABNORMAL HIGH (ref 4.0–10.5)

## 2017-10-15 LAB — OCCULT BLOOD, POC DEVICE: FECAL OCCULT BLD: POSITIVE — AB

## 2017-10-15 LAB — PREPARE RBC (CROSSMATCH)

## 2017-10-15 LAB — ABO/RH: ABO/RH(D): A POS

## 2017-10-15 LAB — APTT: APTT: 32 s (ref 24–36)

## 2017-10-15 LAB — SALICYLATE LEVEL

## 2017-10-15 LAB — ACETAMINOPHEN LEVEL

## 2017-10-15 LAB — MRSA PCR SCREENING: MRSA by PCR: NEGATIVE

## 2017-10-15 SURGERY — EGD (ESOPHAGOGASTRODUODENOSCOPY)
Anesthesia: Monitor Anesthesia Care

## 2017-10-15 MED ORDER — SODIUM CHLORIDE 0.9% IV SOLUTION: Freq: Once | INTRAVENOUS | Status: AC

## 2017-10-15 MED ORDER — SODIUM CHLORIDE 0.9 % IV SOLN: 250.0000 mL | INTRAVENOUS | Status: DC | PRN

## 2017-10-15 MED ORDER — SODIUM CHLORIDE 0.9 % IV SOLN
80.0000 mg | Freq: Once | INTRAVENOUS | Status: AC
  Filled 2017-10-15: qty 80

## 2017-10-15 MED ORDER — SODIUM CHLORIDE 0.9 % IV BOLUS
1000.0000 mL | Freq: Once | INTRAVENOUS | Status: AC
  Administered 2017-10-15: 1000 mL via INTRAVENOUS

## 2017-10-15 MED ORDER — SODIUM CHLORIDE 0.9 % IV SOLN
2.0000 g | INTRAVENOUS | Status: DC
  Administered 2017-10-15 – 2017-10-16 (×2): 2 g via INTRAVENOUS
  Filled 2017-10-15 (×2): qty 2

## 2017-10-15 MED ORDER — SODIUM CHLORIDE 0.9 % IV SOLN
INTRAVENOUS | Status: DC
  Administered 2017-10-16: 10:00:00 via INTRAVENOUS

## 2017-10-15 MED ORDER — FENTANYL CITRATE (PF) 100 MCG/2ML IJ SOLN
25.0000 ug | INTRAMUSCULAR | Status: DC | PRN
  Administered 2017-10-15 – 2017-10-17 (×11): 25 ug via INTRAVENOUS
  Filled 2017-10-15 (×11): qty 2

## 2017-10-15 MED ORDER — ALBUTEROL SULFATE (2.5 MG/3ML) 0.083% IN NEBU: 2.5000 mg | INHALATION_SOLUTION | RESPIRATORY_TRACT | Status: DC | PRN

## 2017-10-15 MED ORDER — SODIUM CHLORIDE 0.9 % IV SOLN
50.0000 ug/h | INTRAVENOUS | Status: DC
  Administered 2017-10-15 – 2017-10-16 (×2): 50 ug/h via INTRAVENOUS
  Filled 2017-10-15 (×6): qty 1

## 2017-10-15 MED ORDER — OCTREOTIDE LOAD VIA INFUSION
50.0000 ug | Freq: Once | INTRAVENOUS | Status: AC
  Administered 2017-10-15: 50 ug via INTRAVENOUS
  Filled 2017-10-15: qty 25

## 2017-10-15 MED ORDER — NICOTINE 14 MG/24HR TD PT24
14.0000 mg | MEDICATED_PATCH | Freq: Every day | TRANSDERMAL | Status: DC
  Filled 2017-10-15 (×2): qty 1

## 2017-10-15 MED ORDER — SODIUM CHLORIDE 0.9 % IV SOLN: 10.0000 mL/h | Freq: Once | INTRAVENOUS | Status: AC

## 2017-10-15 MED ORDER — DEXTROSE 5 % IV SOLN
2.0000 mg | Freq: Once | INTRAVENOUS | Status: AC
  Administered 2017-10-15: 2 mg via INTRAVENOUS
  Filled 2017-10-15: qty 0.2

## 2017-10-15 MED ORDER — ONDANSETRON HCL 4 MG/2ML IJ SOLN: 4.0000 mg | Freq: Once | INTRAMUSCULAR | Status: DC

## 2017-10-15 MED ORDER — SODIUM CHLORIDE 0.9 % IV SOLN
8.0000 mg/h | INTRAVENOUS | Status: DC
  Administered 2017-10-15 (×2): 8 mg/h via INTRAVENOUS
  Filled 2017-10-15 (×5): qty 80

## 2017-10-15 MED ORDER — SODIUM CHLORIDE 0.9% IV SOLUTION
Freq: Once | INTRAVENOUS | Status: AC
  Administered 2017-10-15: 19:00:00 via INTRAVENOUS

## 2017-10-15 MED ORDER — HALOPERIDOL LACTATE 5 MG/ML IJ SOLN
2.0000 mg | Freq: Four times a day (QID) | INTRAMUSCULAR | Status: DC | PRN
  Administered 2017-10-15: 2 mg via INTRAVENOUS
  Filled 2017-10-15 (×2): qty 1

## 2017-10-15 NOTE — H&P (Signed)
PULMONARY / CRITICAL CARE MEDICINE   Name: [name] MRN: [id number] DOB: February 01, 1961    ADMISSION DATE:  10/15/2017 CONSULTATION DATE:  10/15/2017  REFERRING MD:  Clarene Duke RM - [facility] ED  CHIEF COMPLAINT:  Hematemesis.  HISTORY OF PRESENT ILLNESS:   57 year old man with prior history of HCV cirrhosis and schizoaffective disorder.  No prior history for GIB in past.  Prior admissions for ascites but no recent liver-related visits.  Prior admissions for suicidality.  Presented today with 3 days of vomiting blood.  Cannot specified whether this was bright  or dark blood.  Reports bloody bowel movements every 2-3 hours.  Reports mild upper abdominal pain.  He is felt dizzy.  During this time he has not been eating or drinking well.  PAST MEDICAL HISTORY :  He  has a past medical history of Anemia, Ascites, Chronic leg pain, GERD (gastroesophageal reflux disease), H/O diabetes insipidus, Hepatitis C, History of adrenal insufficiency, History of ARDS, History of blood transfusion, Hypothyroidism, Kidney disease, Liver disease, Overdose (12/2009), Peripheral neuropathy, Personality disorder (HCC), PTSD (post-traumatic stress disorder), and Schizoaffective disorder (HCC).  PAST SURGICAL HISTORY: He  has a past surgical history that includes RIGHT HIP SURGERY (1996) and US GUIDED LEFT THORACENTESIS (01/21/2010).  Allergies  Allergen Reactions  . Heparin Other (See Comments)    unknown  . Imipramine Other (See Comments)    unknown  . Lamictal [Lamotrigine] Other (See Comments)    unknown  . Penicillins Other (See Comments)    Pt can not remember allergy and is unable to review questions    No current facility-administered medications on file prior to encounter.    Current Outpatient Medications on File Prior to Encounter  Medication Sig  . benztropine (COGENTIN) 1 MG tablet Take 1 mg by mouth 2 (two) times daily as needed for tremors.   . furosemide (LASIX) 20 MG tablet Take 20 mg by mouth  daily.   Marland Kitchen gabapentin (NEURONTIN) 100 MG capsule Take 200 mg by mouth 3 (three) times daily.  Marland Kitchen HYDROcodone-acetaminophen (NORCO/VICODIN) 5-325 MG per tablet Take 1 tablet by mouth every 6 (six) hours as needed for moderate pain. (Patient not taking: Reported on 09/18/2015)  . ibuprofen (ADVIL,MOTRIN) 600 MG tablet Take 1 tablet (600 mg total) by mouth every 6 (six) hours as needed. (Patient not taking: Reported on 09/08/2017)  . ondansetron (ZOFRAN ODT) 8 MG disintegrating tablet Take 1 tablet (8 mg total) by mouth every 8 (eight) hours as needed for nausea or vomiting. (Patient not taking: Reported on 09/08/2017)  . oxyCODONE (OXY IR/ROXICODONE) 5 MG immediate release tablet Take 1 tablet (5 mg total) by mouth every 6 (six) hours as needed for severe pain.  Marland Kitchen oxyCODONE-acetaminophen (PERCOCET) 7.5-325 MG tablet Take 1 tablet by mouth every 6 (six) hours as needed for severe pain. (Patient not taking: Reported on 09/18/2015)  . SENSIPAR 30 MG tablet Take 30 mg by mouth daily.  . traMADol (ULTRAM) 50 MG tablet Take 1 tablet (50 mg total) by mouth every 6 (six) hours as needed. (Patient not taking: Reported on 09/08/2017)  . trihexyphenidyl (ARTANE) 2 MG tablet Take 2 mg by mouth 3 (three) times daily.    FAMILY HISTORY:  His family history is not on file.  SOCIAL HISTORY: He  reports that he has been smoking cigarettes.  He has a 25.00 pack-year smoking history. He has never used smokeless tobacco. He reports that he does not drink alcohol or use drugs.  REVIEW OF SYSTEMS:  Review of Systems  Constitutional: Positive for malaise/fatigue. Negative for chills and fever.  HENT: Negative.   Eyes: Negative.   Respiratory: Negative.   Cardiovascular: Negative.   Gastrointestinal: Positive for abdominal pain, blood in stool and vomiting.  Genitourinary: Negative.   Musculoskeletal: Positive for joint pain.  Skin: Negative.   Neurological: Positive for dizziness and weakness.  Endo/Heme/Allergies:  Negative.   Psychiatric/Behavioral: Positive for depression.    COURSE IN HOSPITAL:    VITAL SIGNS: BP (!) 90/48   Pulse 93   Temp 98.5 F (36.9 C) (Oral)   Resp (!) 22   Ht 6\' 6"  (1.981 m)   Wt 250 lb (113.4 kg)   SpO2 100%   BMI 28.89 kg/m   HEMODYNAMICS:  no vasopressors  VENTILATOR SETTINGS:  room air.  INTAKE / OUTPUT: No intake/output data recorded.  PHYSICAL EXAMINATION: General:  Corpulent male, ill-kempt, appears stated age. Neuro: Awake alert and conversant.  Vague historian but cooperative and answers questions appropriately.  No asterixis.  No focal motor deficits. HEENT: No scleral icterus.  Oral mucosa coated with vomitus. Cardiovascular: Extremities warm.  JVP flat.  Heart sounds unremarkable. Lungs: Chest clear to auscultation. Abdomen: Abdom flaccid.  No signs of ascites.  No palpable organomegaly.  No flank dullness.  Positive stigmata of liver disease: Gynecomastia, spider nevi. Dark red and coffee ground emesis noted. Musculoskeletal: Moderate bilateral lower extremity edema.  No active joint disease. Skin: Dry skin.  No ecchymoses.  Prior cervical and hip surgery incision noted.  LABS:  BMET Recent Labs  Lab 10/15/17 0622  NA 146*  K 4.6  CL 114*  CO2 25  BUN 46*  CREATININE 2.42*  GLUCOSE 110*    Electrolytes Recent Labs  Lab 10/15/17 0622  CALCIUM 8.1*    CBC Recent Labs  Lab 10/15/17 0622  WBC 13.7*  HGB 8.4*  HCT 24.2*  PLT 229    Coag's Recent Labs  Lab 10/15/17 0649  APTT 32  INR 2.07    Sepsis Markers No results for input(s): LATICACIDVEN, PROCALCITON, O2SATVEN in the last 168 hours.  ABG No results for input(s): PHART, PCO2ART, PO2ART in the last 168 hours.  Liver Enzymes Recent Labs  Lab 10/15/17 0622  AST 21  ALT 11  ALKPHOS 47  BILITOT 1.5*  ALBUMIN 2.5*    Cardiac Enzymes No results for input(s): TROPONINI, PROBNP in the last 168 hours.  Glucose No results for input(s): GLUCAP in the  last 168 hours.  Imaging Ct Abdomen Pelvis Wo Contrast  Result Date: 10/15/2017 CLINICAL DATA:  Rectal bleeding with hemoptysis and hypotension. History of ascites. EXAM: CT ABDOMEN AND PELVIS WITHOUT CONTRAST TECHNIQUE: Multidetector CT imaging of the abdomen and pelvis was performed following the standard protocol without IV contrast. COMPARISON:  06/23/2010 and ultrasound 07/13/2017 FINDINGS: Lower chest: Lung bases demonstrate no consolidation or effusion. Subtle focal cystic change over the right base. Increased AP diameter of the chest. Few small stable lymph nodes versus varices adjacent the distal esophagus unchanged. Hepatobiliary: Nodular contour of the liver. Mild cholelithiasis. Biliary tree is normal. Linear calcification adjacent the celiac axis likely vascular. Pancreas: Within normal. Spleen: Moderate splenomegaly. Adrenals/Urinary Tract: Adrenal glands are normal. Kidneys are normal size without hydronephrosis or nephrolithiasis. Few small bilateral renal cysts. Ureters and bladder are normal. Stomach/Bowel: Stomach and small bowel are normal. Appendix is normal. Much of the colon is decompressed and is otherwise unremarkable. Vascular/Lymphatic: Minimal calcified plaque of the abdominal aorta. Retroaortic left renal vein. Perisplenic varices.  No significant adenopathy. Reproductive: Normal. Other: Very small amount of perihepatic fluid/ascites. Musculoskeletal: Chronic changes of the right hip with severe degenerative change and heterotopic bone formation. Subtle lateral subluxation of the right hip unchanged. Mild degenerative change of the spine. Degenerative change of the left hip. IMPRESSION: No acute findings in the abdomen/pelvis. Evidence of known cirrhosis with very minimal ascites along with splenomegaly and mild perisplenic varices as well as possible mild esophageal varices. Mild cholelithiasis. Bilateral renal cysts. Aortic Atherosclerosis (ICD10-I70.0). Electronically Signed   By:  Elberta Fortis M.D.   On: 10/15/2017 08:16    STUDIES:  EGD pending  CULTURES: N/A  ANTIBIOTICS: Not applicable  SIGNIFICANT EVENTS: Not applicable  LINES/TUBES: Peripheral IVs  DISCUSSION:  57 year old man with acute upper GI bleed in context of known cirrhosis. Hemodynamically stable and does not appear to be bleeding briskly.  High risk Blatchford score of 15 - requires endoscopic evaluation and warrants ICU admission.   ASSESSMENT / PLAN:  PULMONARY A: Active smoker but no symptoms at this time.  Able to protect airway. No evidence of significant aspiration at this time. P:   Nicotine replacement. Early mobilization  Monitor pulmonary status following blood administration.  CARDIOVASCULAR A:  Marginal BP but clinically adequately perfused. Lower BP may be normal given liver disease. P:  Blood product resuscitation for hypotension in the presence of active bleeding.  RENAL A:   Chronic renal insufficiency stage III. Creatinine remains stable. P:   Consider Foley if clinical perfusion appears compromised.  GASTROINTESTINAL A:   Upper GI bleed. Differential includes: PUD, Portal gastropathy or variceal bleed. Chronic liver disease which appears well compensated at present. P:   Empiric IV pantoprazole BID and octreotide infusion pending EGD.  HEMATOLOGIC A:   Reactive leukocytosis, acute blood loss anemia.  INR significantly elevated compared to 2013. P:  Transfuse for HB< 9.0 Correct coagulopathy.  FFP acutely prior to EGD. Vitamin K.  INFECTIOUS A:   No signs of active infection. P:   Antibiotic prophylaxis for SBP with ceftriaxone 1g/24h until able to take oral.  ENDOCRINE A:   Euglycemic with no history of DM.  History of adrenal insufficiency but no chronic replacement at this time.   P:   Add stress steroids if BP fails to respond to volume administration.  NEUROLOGIC A:   Schizoaffective disorder but no sign of hepatic  encephalopathy. P:   Resume home psychiatric medications once able to take orals.  FAMILY  - Updates: no family at bedside. Patient lives with his mother.  - Inter-disciplinary family meet or Palliative Care meeting due by:  day 7.  Lynnell Catalan, MD [facility] ICU Physician [facility] [employer] [facility]  Pager: [phone] Mobile: [phone] After hours: [phone].   10/15/2017, 8:48 AM

## 2017-10-15 NOTE — Assessment & Plan Note (Addendum)
Patient reports that liver disease has been cured (HCV treatment).  CT notes evidence of cirrhosis.  Prior history of ascites with paracentesis several years ago.  On examination: No evidence of tense ascites.  Gynecomastia but no other signs of chronic liver disease.  Peripheral edema.  No asterixis.  Hepatic Function Latest Ref Rng & Units 10/16/2017 10/15/2017 11/14/2015  Total Protein 6.5 - 8.1 g/dL 4.4(L) 4.7(L) 7.1  Albumin 3.5 - 5.0 g/dL 2.5(L) 2.5(L) 4.1  AST 15 - 41 U/L '17 21 27  '$ ALT 0 - 44 U/L '11 11 18  '$ Alk Phosphatase 38 - 126 U/L 41 47 88  Total Bilirubin 0.3 - 1.2 mg/dL 1.7(H) 1.5(H) 0.6  Bilirubin, Direct 0.0 - 0.3 mg/dL - - -   Assessment and plan: Well compensated liver disease.  Monitor for hepatic encephalopathy given protein load from recent GI bleed.

## 2017-10-15 NOTE — Progress Notes (Signed)
Pt verbally aggressive and threatening to get out of bed. Requesting water. This RN & multiple other RN's explained to pt that he is NPO & what that means. He seems to not understand and continues to shout, "How can the Doctor order things for my body? He cannot have a hold on my body. I need water now. I will get water now myself if I have to." This RN continued to explain NPO status and was able to appease the pt with 10 mouth swabs of water. Less than later, pt yelling out for pain medicine, oxycodone specifically. Shouting, "You will give me oxycodone. That is the only thing that works. You will give it to me." This RN explained to the pt that he currently does not have oxycodone ordered but that I will contact the MD to see if we can get pain medicine. This upset the pt even more. Pt verbally aggressive & escalating. [employer] called during this time and informed RN that pt had roughly of bigeminy. Will contact MD & continue to monitor.

## 2017-10-15 NOTE — ED Triage Notes (Signed)
PT presents to ED via EMS for rectal bleeding, vomiting blood, and hypotension. Pt reports pain all over. Pt uncooperative with EMS.

## 2017-10-15 NOTE — ED Notes (Signed)
Pt. POC occult blood results positive, not crossing over into meter. PA,Michael made aware.

## 2017-10-15 NOTE — ED Provider Notes (Addendum)
[facility]-EMERGENCY DEPT Provider Note   CSN: [id number] Arrival date & time: 10/15/17  0609     History   Chief Complaint Chief Complaint  Patient presents with  . Hematemesis  . Rectal Bleeding  . Hypotension    HPI [name] is a 57 y.o. male with a history of anemia, prior blood transfusion, ascites, hepatitis C, kidney disease, liver disease who presents emergency department today for rectal bleeding.  Patient reports that yesterday around noon he started having bloody diarrhea x 3 every hour until 2 hours ago. He reports that he had one episode of nausea and blood streaked emesis x 1 around midnight (7 hours ago). He reports generalized abdominal pain.  Patient is a very poor historian.  He does appear pale. He denies any anticoagulation use. Reports requiring prior blood transfusion but is not sure why or when. He does not follow with a GI doctor. I do not see one listed on epic. The patient denies prior colonoscopy or abdominal surgeries.   HPI  Past Medical History:  Diagnosis Date  . Anemia   . Ascites   . Aspiration pneumonia (HCC)    HX OF  . Chronic leg pain   . GERD (gastroesophageal reflux disease)   . H/O diabetes insipidus   . Hepatitis C   . History of adrenal insufficiency   . History of ARDS   . History of blood transfusion   . Hx of Clostridium difficile infection   . Hypothyroidism   . Kidney disease   . Liver disease   . Overdose 12/2009  . Peripheral neuropathy   . Personality disorder (HCC)   . Pleural effusion, bilateral    HX OF  . PTSD (post-traumatic stress disorder)   . PTSD (post-traumatic stress disorder)    SECONDARY TO WAR IN [place]  . Schizoaffective disorder [facility])     Patient Active Problem List   Diagnosis Date Noted  . PTSD (post-traumatic stress disorder)   . Hypothyroidism   . History of adrenal insufficiency   . H/O diabetes insipidus   . Schizoaffective disorder (HCC) 11/08/2011  . Personality  disorder (HCC)   . Anemia   . Ascites   . Liver disease   . Hepatitis C   . Kidney disease   . GERD (gastroesophageal reflux disease)   . Psychosis (HCC) 11/07/2011    Past Surgical History:  Procedure Laterality Date  . RIGHT HIP SURGERY  1996  . US GUIDED LEFT THORACENTESIS  01/21/2010        Home Medications    Prior to Admission medications   Medication Sig Start Date End Date Taking? Authorizing Provider  benztropine (COGENTIN) 1 MG tablet Take 1 mg by mouth 2 (two) times daily as needed for tremors.     [provider]  furosemide (LASIX) 20 MG tablet Take 20 mg by mouth daily.     [provider]  gabapentin (NEURONTIN) 100 MG capsule Take 200 mg by mouth 3 (three) times daily.    [provider]  HYDROcodone-acetaminophen (NORCO/VICODIN) 5-325 MG per tablet Take 1 tablet by mouth every 6 (six) hours as needed for moderate pain. Patient not taking: Reported on 09/18/2015 12/18/14   Elwin Mocha, MD  ibuprofen (ADVIL,MOTRIN) 600 MG tablet Take 1 tablet (600 mg total) by mouth every 6 (six) hours as needed. Patient not taking: Reported on 09/08/2017 05/23/16   Audry Pili, PA-C  ondansetron (ZOFRAN ODT) 8 MG disintegrating tablet Take 1 tablet (8  mg total) by mouth every 8 (eight) hours as needed for nausea or vomiting. Patient not taking: Reported on 09/08/2017 11/14/15   Azalia Bilis, MD  oxyCODONE (OXY IR/ROXICODONE) 5 MG immediate release tablet Take 1 tablet (5 mg total) by mouth every 6 (six) hours as needed for severe pain. 09/08/17   Garlon Hatchet, PA-C  oxyCODONE-acetaminophen (PERCOCET) 7.5-325 MG tablet Take 1 tablet by mouth every 6 (six) hours as needed for severe pain. Patient not taking: Reported on 09/18/2015 04/17/15   Melton Krebs, PA-C  SENSIPAR 30 MG tablet Take 30 mg by mouth daily. 07/25/17   [provider]  traMADol (ULTRAM) 50 MG tablet Take 1 tablet (50 mg total) by mouth every 6 (six) hours as needed. Patient  not taking: Reported on 09/08/2017 05/23/16   Audry Pili, PA-C  trihexyphenidyl (ARTANE) 2 MG tablet Take 2 mg by mouth 3 (three) times daily. 08/29/17   [provider]    Family History No family history on file.  Social History Social History   Tobacco Use  . Smoking status: Current Every Day Smoker    Packs/day: 1.00    Years: 25.00    Pack years: 25.00    Types: Cigarettes  . Smokeless tobacco: Never Used  Substance Use Topics  . Alcohol use: No    Comment: Pt denies   . Drug use: No    Comment: Pt denies     Allergies   Heparin; Imipramine; Lamictal [lamotrigine]; and Penicillins   Review of Systems Review of Systems  All other systems reviewed and are negative.    Physical Exam Updated Vital Signs BP (!) 87/52 (BP Location: Right Arm)   Pulse 93   Temp 98.5 F (36.9 C) (Oral)   Resp (!) 26   Ht 6\' 6"  (1.981 m)   Wt 113.4 kg (250 lb)   SpO2 99%   BMI 28.89 kg/m   Physical Exam  Constitutional: He appears well-developed and well-nourished.  HENT:  Head: Normocephalic and atraumatic.  Right Ear: External ear normal.  Left Ear: External ear normal.  Nose: Nose normal.  Mouth/Throat: Uvula is midline, oropharynx is clear and moist and mucous membranes are normal. No tonsillar exudate.  Eyes: Pupils are equal, round, and reactive to light. Right eye exhibits no discharge. Left eye exhibits no discharge. No scleral icterus.  Pale conjunctiva  Neck: Trachea normal. Neck supple. No spinous process tenderness present. No neck rigidity. Normal range of motion present.  Cardiovascular: Normal rate, regular rhythm and intact distal pulses.  No murmur heard. Pulses:      Radial pulses are 2+ on the right side, and 2+ on the left side.       Dorsalis pedis pulses are 2+ on the right side, and 2+ on the left side.       Posterior tibial pulses are 2+ on the right side, and 2+ on the left side.  No lower extremity swelling or edema. Calves symmetric in  size bilaterally.  Pulmonary/Chest: Effort normal and breath sounds normal. He exhibits no tenderness.  Abdominal: Soft. Bowel sounds are normal. There is generalized tenderness. There is no rigidity, no rebound, no guarding and no CVA tenderness.  Genitourinary:  Genitourinary Comments: Chaperone was present.  Patient with gross blood on depends with blood per rectum  Musculoskeletal: He exhibits no edema.  Lymphadenopathy:    He has no cervical adenopathy.  Neurological: He is alert.  Skin: Skin is warm and dry. No rash noted. He  is not diaphoretic. There is pallor.  Psychiatric: He has a normal mood and affect.  Nursing note and vitals reviewed.    ED Treatments / Results  Labs (all labs ordered are listed, but only abnormal results are displayed) Labs Reviewed  COMPREHENSIVE METABOLIC PANEL - Abnormal; Notable for the following components:      Result Value   Sodium 146 (*)    Chloride 114 (*)    Glucose, Bld 110 (*)    BUN 46 (*)    Creatinine, Ser 2.42 (*)    Calcium 8.1 (*)    Total Protein 4.7 (*)    Albumin 2.5 (*)    Total Bilirubin 1.5 (*)    GFR calc non Af Amer 28 (*)    GFR calc Af Amer 33 (*)    All other components within normal limits  CBC - Abnormal; Notable for the following components:   WBC 13.7 (*)    RBC 2.45 (*)    Hemoglobin 8.4 (*)    HCT 24.2 (*)    MCH 34.3 (*)    All other components within normal limits  PROTIME-INR - Abnormal; Notable for the following components:   Prothrombin Time 23.2 (*)    All other components within normal limits  APTT  ACETAMINOPHEN LEVEL  SALICYLATE LEVEL  POC OCCULT BLOOD, ED  POC OCCULT BLOOD, ED  TYPE AND SCREEN  ABO/RH  PREPARE RBC (CROSSMATCH)  PREPARE FRESH FROZEN PLASMA    EKG EKG Interpretation  Date/Time:  Saturday October 15 2017 06:58:58 EDT Ventricular Rate:  91 PR Interval:    QRS Duration: 91 QT Interval:  407 QTC Calculation: 501 R Axis:   74 Text Interpretation:  Sinus rhythm Prolonged  QT interval Confirmed by Palumbo, April ([id number]) on 10/15/2017 7:02:39 AM   Radiology Ct Abdomen Pelvis Wo Contrast  Result Date: 10/15/2017 CLINICAL DATA:  Rectal bleeding with hemoptysis and hypotension. History of ascites. EXAM: CT ABDOMEN AND PELVIS WITHOUT CONTRAST TECHNIQUE: Multidetector CT imaging of the abdomen and pelvis was performed following the standard protocol without IV contrast. COMPARISON:  06/23/2010 and ultrasound 07/13/2017 FINDINGS: Lower chest: Lung bases demonstrate no consolidation or effusion. Subtle focal cystic change over the right base. Increased AP diameter of the chest. Few small stable lymph nodes versus varices adjacent the distal esophagus unchanged. Hepatobiliary: Nodular contour of the liver. Mild cholelithiasis. Biliary tree is normal. Linear calcification adjacent the celiac axis likely vascular. Pancreas: Within normal. Spleen: Moderate splenomegaly. Adrenals/Urinary Tract: Adrenal glands are normal. Kidneys are normal size without hydronephrosis or nephrolithiasis. Few small bilateral renal cysts. Ureters and bladder are normal. Stomach/Bowel: Stomach and small bowel are normal. Appendix is normal. Much of the colon is decompressed and is otherwise unremarkable. Vascular/Lymphatic: Minimal calcified plaque of the abdominal aorta. Retroaortic left renal vein. Perisplenic varices. No significant adenopathy. Reproductive: Normal. Other: Very small amount of perihepatic fluid/ascites. Musculoskeletal: Chronic changes of the right hip with severe degenerative change and heterotopic bone formation. Subtle lateral subluxation of the right hip unchanged. Mild degenerative change of the spine. Degenerative change of the left hip. IMPRESSION: No acute findings in the abdomen/pelvis. Evidence of known cirrhosis with very minimal ascites along with splenomegaly and mild perisplenic varices as well as possible mild esophageal varices. Mild cholelithiasis. Bilateral renal cysts.  Aortic Atherosclerosis (ICD10-I70.0). Electronically Signed   By: Elberta Fortis M.D.   On: 10/15/2017 08:16    Procedures Procedures (including critical care time) CRITICAL CARE Performed by: Jacinto Halim   Total  critical care time: 75 minutes - Patient with GI bleed, hypotension requiring multiple boluses of fluid, blood transfusion and multiple consult inclduing icu admission.  Critical care time was exclusive of separately billable procedures and treating other patients.  Critical care was necessary to treat or prevent imminent or life-threatening deterioration.  Critical care was time spent personally by me on the following activities: development of treatment plan with patient and/or surrogate as well as nursing, discussions with consultants, evaluation of patient's response to treatment, examination of patient, obtaining history from patient or surrogate, ordering and performing treatments and interventions, ordering and review of laboratory studies, ordering and review of radiographic studies, pulse oximetry and re-evaluation of patient's condition.  Medications Ordered in ED Medications  pantoprazole (PROTONIX) 80 mg in sodium chloride 0.9 % 100 mL IVPB (has no administration in time range)  pantoprazole (PROTONIX) 80 mg in sodium chloride 0.9 % 250 mL (0.32 mg/mL) infusion (has no administration in time range)  0.9 %  sodium chloride infusion (Manually program via Guardrails IV Fluids) (has no administration in time range)  sodium chloride 0.9 % bolus 1,000 mL (1,000 mLs Intravenous New Bag/Given 10/15/17 0852)  0.9 %  sodium chloride infusion (has no administration in time range)  octreotide (SANDOSTATIN) 2 mcg/mL load via infusion 50 mcg (has no administration in time range)    And  octreotide (SANDOSTATIN) 500 mcg in sodium chloride 0.9 % 250 mL (2 mcg/mL) infusion (has no administration in time range)  sodium chloride 0.9 % bolus 1,000 mL (0 mLs Intravenous Stopped 10/15/17  0837)     Initial Impression / Assessment and Plan / ED Course  I have reviewed the triage vital signs and the nursing notes.  Pertinent labs & imaging results that were available during my care of the patient were reviewed by me and considered in my medical decision making (see chart for details).     57 y.o. male with a history of anemia, prior blood transfusion, ascites, hepatitis C, kidney disease, liver disease who presents emergency department today for rectal bleeding.  Patient reports that yesterday around noon he started having bloody diarrhea x 3 every hour until 2 hours ago. He reports that he had one episode of nausea and blood streaked emesis x 1 around midnight (7 hours ago). He reports generalized abdominal pain.  He is not anticoagulated.  He does not have a GI doctor.  He is never had a colonoscopy or prior abdominal surgeries.  On arrival patient is hypotensive at 78/51.  He is without fever, tachycardia, tachypnea or hypoxia.  On exam patient is with generalized abdominal pain.  There is mild distention without tight ascites noted.  He is without peritoneal signs.  There is no localized abdominal pain.  On rectal exam patient is with a gross amount of bright red blood per the rectum and also on patient's depends.  His Hemoccult is positive.  Patient does appear pale.    Blood work, imaging and IV fluid initiated.  Patient started on Protonix bolus and drip.  Unsure if this is upper or lower GI bleed given patient has both had hematemesis as well as bright red blood per the rectum.  Patient does have anemia at 8.4.  This is down from patient's prior of 12.6 in 2017.  2 units of blood ordered for transfusion.  Patient with chronically elevated creatinine and BUN.  CT ordered without contrast to evaluate for intra-abdominal pathology.  Patient's INR is elevated at 2.07, likely secondary to his  liver disease.  Patient's platelet count is 229.  Patient is received 2 L of fluid in the  department as well as Protonix bolus and infusion.  Will consult GI as well as ICU for admission.  7:45 AM I appreciate ICU for returning my page. ICU to see & admit the patient.  Discussed with Dr. Rhea Belton of [facility]). Recommends 2U of FFP. Octreotide infusion and will need EGD today.   Patient admitted by ICU, Dr. Denese Killings.  Final Clinical Impressions(s) / ED Diagnoses   Final diagnoses:  Rectal bleeding  Hematemesis with nausea  Generalized abdominal pain  History of liver disease  History of ascites  Hypotension due to blood loss  Symptomatic anemia    ED Discharge Orders    None       Jacinto Halim, PA-C 10/15/17 [id number]    Jacinto Halim, PA-C 10/15/17 [id number]    Nicanor Alcon, April, MD 10/15/17 [id number]

## 2017-10-15 NOTE — ED Notes (Signed)
After GI PA came and discussed with pt about GI scope (which he refused), pt stated to this nurse that he would rather die than have the scope done.

## 2017-10-15 NOTE — ED Notes (Signed)
Critical care paged for Michael.

## 2017-10-15 NOTE — ED Notes (Signed)
ED TO INPATIENT HANDOFF REPORT  Name/Age/Gender [name] 57 y.o. male  Code Status    Code Status Orders  (From admission, onward)        Start     Ordered   10/15/17 0937  Full code  Continuous     10/15/17 0943    Code Status History    Date Active Date Inactive Code Status Order ID Comments User Context   01/17/2013 1534 01/23/2013 1730 Full Code [id number]  Alfredia Client ED   12/30/2011 0256 12/31/2011 1312 Full Code [id number]  Christa See, RN Inpatient   12/24/2011 1816 12/30/2011 0256 Full Code [id number]  Rhunette Croft, MD ED   12/23/2011 1522 12/24/2011 1107 Full Code [id number]  Virgel Manifold, MD ED   11/08/2011 0008 11/08/2011 2324 Full Code [id number]  Janice Norrie, MD ED      Home/SNF/Other Home  Chief Complaint rectal bleeding  Level of Care/Admitting Diagnosis ED Disposition    ED Disposition Condition [facility] Area: [facility] [[id number]]  Level of Care: Stepdown [14]  Admit to SDU based on following criteria: Hemodynamic compromise or significant risk of instability:  Patient requiring short term acute titration and management of vasoactive drips, and invasive monitoring (i.e., CVP and Arterial line).  Diagnosis: Acute upper gastrointestinal bleeding [431540]  Admitting Physician: Kipp Brood [[id number]]  Attending Physician: Kipp Brood [[id number]]  Estimated length of stay: 3 - 4 days  Certification:: I certify this patient will need inpatient services for at least 2 midnights  PT Class (Do Not Modify): Inpatient [101]  PT Acc Code (Do Not Modify): Private [1]       Medical History Past Medical History:  Diagnosis Date  . Anemia   . Ascites   . Chronic leg pain   . Cirrhosis ([place])   . GERD (gastroesophageal reflux disease)   . H/O diabetes insipidus   . Hepatitis C    Completed therapy with Epclusa ~ 2018.  suspect therapy overseen by [facility] in [place], [place]  . History of adrenal  insufficiency   . History of ARDS   . History of blood transfusion   . Hypothyroidism   . Kidney disease   . Overdose 12/2009  . Peripheral neuropathy   . Personality disorder ([place])   . PTSD (post-traumatic stress disorder)    SECONDARY TO WAR IN [place]  . Schizoaffective disorder ([facility])     Allergies Allergies  Allergen Reactions  . Heparin Other (See Comments)    unknown  . Imipramine Other (See Comments)    unknown  . Lamictal [Lamotrigine] Other (See Comments)    unknown  . Penicillins Other (See Comments)    Pt can not remember allergy and is unable to review questions    IV Location/Drains/Wounds Patient Lines/Drains/Airways Status   Active Line/Drains/Airways    Name:   Placement date:   Placement time:   Site:   Days:   Peripheral IV 10/15/17 Left Hand   10/15/17    -    Hand   less than 1   Peripheral IV 10/15/17 Right Forearm   10/15/17    0645    Forearm   less than 1          Labs/Imaging Results for orders placed or performed during the hospital encounter of 10/15/17 (from the past 48 hour(s))  Comprehensive metabolic panel     Status: Abnormal   Collection Time: 10/15/17  6:22 AM  Result Value Ref Range   Sodium 146 (H) 135 - 145 mmol/L   Potassium 4.6 3.5 - 5.1 mmol/L   Chloride 114 (H) 98 - 111 mmol/L    Comment: Please note change in reference range.   CO2 25 22 - 32 mmol/L   Glucose, Bld 110 (H) 70 - 99 mg/dL    Comment: Please note change in reference range.   BUN 46 (H) 6 - 20 mg/dL    Comment: Please note change in reference range.   Creatinine, Ser 2.42 (H) 0.61 - 1.24 mg/dL   Calcium 8.1 (L) 8.9 - 10.3 mg/dL   Total Protein 4.7 (L) 6.5 - 8.1 g/dL   Albumin 2.5 (L) 3.5 - 5.0 g/dL   AST 21 15 - 41 U/L   ALT 11 0 - 44 U/L    Comment: Please note change in reference range.   Alkaline Phosphatase 47 38 - 126 U/L   Total Bilirubin 1.5 (H) 0.3 - 1.2 mg/dL   GFR calc non Af Amer 28 (L) >60 mL/min   GFR calc Af Amer 33 (L) >60 mL/min     Comment: (NOTE) The eGFR has been calculated using the CKD EPI equation. This calculation has not been validated in all clinical situations. eGFR's persistently <60 mL/min signify possible Chronic Kidney Disease.    Anion gap 7 5 - 15    Comment: Performed at [facility], [place] [address], [place], [place] [address]  CBC     Status: Abnormal   Collection Time: 10/15/17  6:22 AM  Result Value Ref Range   WBC 13.7 (H) 4.0 - 10.5 K/uL   RBC 2.45 (L) 4.22 - 5.81 MIL/uL   Hemoglobin 8.4 (L) 13.0 - 17.0 g/dL   HCT 24.2 (L) 39.0 - 52.0 %   MCV 98.8 78.0 - 100.0 fL   MCH 34.3 (H) 26.0 - 34.0 pg   MCHC 34.7 30.0 - 36.0 g/dL   RDW 15.0 11.5 - 15.5 %   Platelets 229 150 - 400 K/uL    Comment: Performed at [facility], [place] [address], [place], [place] [address]  Type and screen [place]     Status: None (Preliminary result)   Collection Time: 10/15/17  6:22 AM  Result Value Ref Range   ABO/RH(D) A POS    Antibody Screen NEG    Sample Expiration 10/18/2017    Unit Number [id number]    Blood Component Type RED CELLS,LR    Unit division 00    Status of Unit ALLOCATED    Transfusion Status OK TO TRANSFUSE    Crossmatch Result Compatible    Unit Number [id number]    Blood Component Type RED CELLS,LR    Unit division 00    Status of Unit ISSUED    Transfusion Status OK TO TRANSFUSE    Crossmatch Result      Compatible Performed at [facility], [place] [address] [place], [place] [address]    Unit Number [id number]    Blood Component Type RED CELLS,LR    Unit division 00    Status of Unit ALLOCATED    Transfusion Status OK TO TRANSFUSE    Crossmatch Result Compatible    Unit Number [id number]    Blood Component Type RED CELLS,LR    Unit division 00    Status of Unit ALLOCATED    Transfusion Status OK TO TRANSFUSE    Crossmatch Result Compatible   Protime-INR  Status: Abnormal   Collection  Time: 10/15/17  6:49 AM  Result Value Ref Range   Prothrombin Time 23.2 (H) 11.4 - 15.2 seconds   INR 2.07     Comment: Performed at [facility], [place] [address] [place], [place] [address]  APTT     Status: None   Collection Time: 10/15/17  6:49 AM  Result Value Ref Range   aPTT 32 24 - 36 seconds    Comment: Performed at [facility], [place] [address] [place], [place] [address]  ABO/Rh     Status: None   Collection Time: 10/15/17  6:51 AM  Result Value Ref Range   ABO/RH(D)      A POS Performed at [facility], [place] [address], [place], [place] [address]   Prepare RBC     Status: None   Collection Time: 10/15/17  6:52 AM  Result Value Ref Range   Order Confirmation      ORDER PROCESSED BY BLOOD BANK Performed at [facility], [place] [address], [place], [place] [address]   Prepare fresh frozen plasma     Status: None (Preliminary result)   Collection Time: 10/15/17  7:47 AM  Result Value Ref Range   Unit Number [id number]    Blood Component Type THAWED PLASMA    Unit division 00    Status of Unit ISSUED    Transfusion Status OK TO TRANSFUSE    Unit Number [id number]    Blood Component Type THAWED PLASMA    Unit division 00    Status of Unit ISSUED    Transfusion Status      OK TO TRANSFUSE Performed at [place] [address] [place], [place] [address]    Ct Abdomen Pelvis Wo Contrast  Result Date: 10/15/2017 CLINICAL DATA:  Rectal bleeding with hemoptysis and hypotension. History of ascites. EXAM: CT ABDOMEN AND PELVIS WITHOUT CONTRAST TECHNIQUE: Multidetector CT imaging of the abdomen and pelvis was performed following the standard protocol without IV contrast. COMPARISON:  06/23/2010 and ultrasound 07/13/2017 FINDINGS: Lower chest: Lung bases demonstrate no consolidation or effusion. Subtle focal cystic change over the right base. Increased AP diameter of the chest.  Few small stable lymph nodes versus varices adjacent the distal esophagus unchanged. Hepatobiliary: Nodular contour of the liver. Mild cholelithiasis. Biliary tree is normal. Linear calcification adjacent the celiac axis likely vascular. Pancreas: Within normal. Spleen: Moderate splenomegaly. Adrenals/Urinary Tract: Adrenal glands are normal. Kidneys are normal size without hydronephrosis or nephrolithiasis. Few small bilateral renal cysts. Ureters and bladder are normal. Stomach/Bowel: Stomach and small bowel are normal. Appendix is normal. Much of the colon is decompressed and is otherwise unremarkable. Vascular/Lymphatic: Minimal calcified plaque of the abdominal aorta. Retroaortic left renal vein. Perisplenic varices. No significant adenopathy. Reproductive: Normal. Other: Very small amount of perihepatic fluid/ascites. Musculoskeletal: Chronic changes of the right hip with severe degenerative change and heterotopic bone formation. Subtle lateral subluxation of the right hip unchanged. Mild degenerative change of the spine. Degenerative change of the left hip. IMPRESSION: No acute findings in the abdomen/pelvis. Evidence of known cirrhosis with very minimal ascites along with splenomegaly and mild perisplenic varices as well as possible mild esophageal varices. Mild cholelithiasis. Bilateral renal cysts. Aortic Atherosclerosis (ICD10-I70.0). Electronically Signed   By: Marin Olp M.D.   On: 10/15/2017 08:16    Pending Labs Unresulted Labs (From admission, onward)   Start     Ordered   10/16/17 0500  Comprehensive  metabolic panel  Daily,   R     10/15/17 0943   10/16/17 0500  CBC  Daily,   R     10/15/17 0943   10/16/17 0500  Protime-INR  Daily,   R     10/15/17 0943   10/15/17 1800  CBC  Once,   R    Comments:  Post transfusion.    10/15/17 0948   10/15/17 1800  Protime-INR  Once,   R     10/15/17 0948   10/15/17 0935  HIV antibody (Routine Testing)  Once,   R     10/15/17 0943   10/15/17  0721  Acetaminophen level  Once,   STAT     10/15/17 0721   83/43/73 5789  Salicylate level  Once,   STAT     10/15/17 0721      Vitals/Pain Today's Vitals   10/15/17 0943 10/15/17 0945 10/15/17 1000 10/15/17 1003  BP: 96/64 1'02/66 90/67 90/67 '$  Pulse: 93 84 85 83  Resp: 16 (!) '25 18 20  '$ Temp:    98.6 F (37 C)  TempSrc:    Oral  SpO2:  100% 96%   Weight:      Height:        Isolation Precautions No active isolations  Medications Medications  pantoprazole (PROTONIX) 80 mg in sodium chloride 0.9 % 100 mL IVPB (has no administration in time range)  pantoprazole (PROTONIX) 80 mg in sodium chloride 0.9 % 250 mL (0.32 mg/mL) infusion (8 mg/hr Intravenous New Bag/Given 10/15/17 1042)  0.9 %  sodium chloride infusion (Manually program via Guardrails IV Fluids) (has no administration in time range)  0.9 %  sodium chloride infusion (has no administration in time range)  octreotide (SANDOSTATIN) 2 mcg/mL load via infusion 50 mcg (50 mcg Intravenous Bolus from Bag 10/15/17 0917)    And  octreotide (SANDOSTATIN) 500 mcg in sodium chloride 0.9 % 250 mL (2 mcg/mL) infusion (has no administration in time range)  0.9 %  sodium chloride infusion (has no administration in time range)  0.9 %  sodium chloride infusion (has no administration in time range)  nicotine (NICODERM CQ - dosed in mg/24 hours) patch 14 mg (has no administration in time range)  albuterol (PROVENTIL) (2.5 MG/3ML) 0.083% nebulizer solution 2.5 mg (has no administration in time range)  cefTRIAXone (ROCEPHIN) 2 g in sodium chloride 0.9 % 100 mL IVPB (has no administration in time range)  phytonadione (VITAMIN K) 2 mg in dextrose 5 % 50 mL IVPB (has no administration in time range)  sodium chloride 0.9 % bolus 1,000 mL (0 mLs Intravenous Stopped 10/15/17 0837)  sodium chloride 0.9 % bolus 1,000 mL (1,000 mLs Intravenous New Bag/Given 10/15/17 7847)    Mobility walks with person assist

## 2017-10-15 NOTE — Consult Note (Signed)
[facility] Consult: 9:38 AM 10/15/2017  LOS: 0 days    Referring Provider: ED PA Leary Roca.    Primary Care Physician:  Fleet Contras, MD Primary Gastroenterologist:  Gentry Fitz.   Mother's, Basilio Meadow, phone number is [phone]    Reason for Consultation:  Hematemesis and hematochezia.     HPI: [name] is a 57 y.o. male.  Emigrated from [place] more than 25 years ago, lives with his mother.  Hepatitis C. cirrhosis.  Psychiatric disorders including PTSD, personality and schizoaffective disorder.  Chronic bilateral leg pain, treated with oxycodone.  CKD stage 3b.  C. difficile colitis 2011.   Hypothyroidism.  Diabetes insipidus, attributed to Nexium.  ? Seizure d/o 2012.  .   Liver biopsy 05/2001 showing chronic active hepatitis with grade 3 activity, grade 2 fibrosis.  Ultimately developed cirrhosis.  Ascites.  Paracentesis 2012, no evidence of SBP.  Normocytic anemia.  Thrombocytopenia, platelets 103K in 10/2015.   Hepatitis C treated and apparently eradicated with Epclusa last year.  Suspect this was through the [facility], Annamarie Major, NP.  Patient has never undergone upper endoscopy or colonoscopy and has never had GI bleeding.  Presented to ED 6 AM today.  Having bloody stools for a couple of days and last night started vomiting dark blood.  No abdominal pain.  Last episode of hematemesis was about 9 AM today in the ED.  Does not use aspirin or NSAIDs, they do not work for his pain control so he does not use them. Normally has regular bowel movements.  Hemoglobin 8.4.  MCV 98.  Platelets 229.  WBCs 13.7. Hemoglobin 12.6 in 10/2015, 9.0 in 12/2011. PT 23.2/INR 2.0/PTT 32. T bili 1.5, albumin 2.5. Otherwise normal LFTs. Stable CKD.  Noncontrasted CT abdomen pelvis shows known  cirrhosis, minimal ascites, splenomegaly, mild perisplenic varices and possible mild esophageal varices.  Mild cholelithiasis.  Renal cysts.  Aortic atherosclerosis.  Receiving FFP, PRBCs, octreotide and Protonix drip.  Received IV vitamin K, Rocephin. No respiratory issues or need for intubation/vent support so far.  Patient has approved and consented to all of the transfusions and medications which are currently running but he refuses to consent to upper endoscopy.   Past Medical History:  Diagnosis Date  . Anemia   . Ascites   . Chronic leg pain   . GERD (gastroesophageal reflux disease)   . H/O diabetes insipidus   . Hepatitis C    Reports prior treatment  . History of adrenal insufficiency   . History of ARDS   . History of blood transfusion   . Hypothyroidism   . Kidney disease   . Liver disease   . Overdose 12/2009  . Peripheral neuropathy   . Personality disorder (HCC)   . PTSD (post-traumatic stress disorder)    SECONDARY TO WAR IN [place]  . Schizoaffective disorder [facility])     Past Surgical History:  Procedure Laterality Date  . CERVICAL SPINE SURGERY  unknown  . RIGHT HIP SURGERY  1996  . US GUIDED LEFT THORACENTESIS  01/21/2010  Prior to Admission medications   Medication Sig Start Date End Date Taking? Authorizing Provider  benztropine (COGENTIN) 1 MG tablet Take 1 mg by mouth 2 (two) times daily as needed for tremors.     [provider]  furosemide (LASIX) 20 MG tablet Take 20 mg by mouth daily.     [provider]  gabapentin (NEURONTIN) 100 MG capsule Take 200 mg by mouth 3 (three) times daily.    [provider]  HYDROcodone-acetaminophen (NORCO/VICODIN) 5-325 MG per tablet Take 1 tablet by mouth every 6 (six) hours as needed for moderate pain. Patient not taking: Reported on 09/18/2015 12/18/14   Elwin Mocha, MD         ondansetron (ZOFRAN ODT) 8 MG disintegrating tablet Take 1 tablet (8 mg total) by mouth every 8 (eight)  hours as needed for nausea or vomiting. Patient not taking: Reported on 09/08/2017 11/14/15   Azalia Bilis, MD  oxyCODONE (OXY IR/ROXICODONE) 5 MG immediate release tablet Take 1 tablet (5 mg total) by mouth every 6 (six) hours as needed for severe pain. 09/08/17   Garlon Hatchet, PA-C  oxyCODONE-acetaminophen (PERCOCET) 7.5-325 MG tablet Take 1 tablet by mouth every 6 (six) hours as needed for severe pain. Patient not taking: Reported on 09/18/2015 04/17/15   Melton Krebs, PA-C  SENSIPAR 30 MG tablet Take 30 mg by mouth daily. 07/25/17   [provider]  traMADol (ULTRAM) 50 MG tablet Take 1 tablet (50 mg total) by mouth every 6 (six) hours as needed. Patient not taking: Reported on 09/08/2017 05/23/16   Audry Pili, PA-C  trihexyphenidyl (ARTANE) 2 MG tablet Take 2 mg by mouth 3 (three) times daily. 08/29/17   [provider]    Scheduled Meds: . sodium chloride   Intravenous Once   Infusions: . sodium chloride    . octreotide  (SANDOSTATIN)    IV infusion    . pantoprazole (PROTONIX) IVPB    . pantoprozole (PROTONIX) infusion    . sodium chloride 1,000 mL (10/15/17 0852)   PRN Meds:    Allergies as of 10/15/2017 - Review Complete 10/15/2017  Allergen Reaction Noted  . Heparin Other (See Comments) 01/20/2013  . Imipramine Other (See Comments) 01/20/2013  . Lamictal [lamotrigine] Other (See Comments) 01/20/2013  . Penicillins Other (See Comments) 01/20/2013    No family history on file.  Social History   Socioeconomic History  . Marital status: Single    Spouse name: Not on file  . Number of children: Not on file  . Years of education: Not on file  . Highest education level: Not on file  Occupational History  . Not on file  Social Needs  . Financial resource strain: Not on file  . Food insecurity:    Worry: Not on file    Inability: Not on file  . Transportation needs:    Medical: Not on file    Non-medical: Not on file  Tobacco Use  .  Smoking status: Current Every Day Smoker    Packs/day: 1.00    Years: 25.00    Pack years: 25.00    Types: Cigarettes  . Smokeless tobacco: Never Used  Substance and Sexual Activity  . Alcohol use: No    Comment: Pt denies   . Drug use: No    Comment: Pt denies  . Sexual activity: Never  Lifestyle  . Physical activity:    Days per week: Not on file    Minutes per session: Not on  file  . Stress: Not on file  Relationships  . Social connections:    Talks on phone: Not on file    Gets together: Not on file    Attends religious service: Not on file    Active member of club or organization: Not on file    Attends meetings of clubs or organizations: Not on file    Relationship status: Not on file  . Intimate partner violence:    Fear of current or ex partner: Not on file    Emotionally abused: Not on file    Physically abused: Not on file    Forced sexual activity: Not on file  Other Topics Concern  . Not on file  Social History Narrative  . Not on file    REVIEW OF SYSTEMS: Constitutional: Generally no weakness or fatigue.  Not very active due to chronic pain. ENT:  No nose bleeds Pulm: If occult he breathing.  No cough. CV:  No palpitations.  Minor LE edema.  No chest pain. GU:  No hematuria, no frequency GI: No dysphagia.  Normally does not have blood in his stools.  No constipation or altered bowel habits. Heme: No unusual or excessive bleeding/bruising. Transfusions: Remote, previous transfusions under unclear circumstances. Neuro:  No headaches, no peripheral tingling or numbness.  No seizures. Derm:  No itching, no rash or sores.  Endocrine:  No sweats or chills.  No polyuria or dysuria Immunization:  Not queried. Travel:  None beyond local counties in last few months.    PHYSICAL EXAM: Vital signs in last 24 hours: Vitals:   10/15/17 0920 10/15/17 0922  BP: 92/66 92/66  Pulse: 87 87  Resp: 16 16  Temp: 98.4 F (36.9 C) 98.4 F (36.9 C)  SpO2:     Wt  Readings from Last 3 Encounters:  10/15/17 250 lb (113.4 kg)  09/20/17 250 lb (113.4 kg)  09/08/17 250 lb (113.4 kg)    General: chronically ill looking WM, disheveled, looks old for age. Ashen coloring.  A towel on the floor soiled with a large amount of splattered dark bloody emesis Head: No signs of head trauma, no asymmetry, no swelling. Eyes: No scleral icterus, no conjunctival pallor.  EOMI. Ears: Not hard of hearing Nose: No congestion, no discharge Mouth: Moist, pink, clear oral mucosa.   Missing teeth Neck: No JVD, no masses, no thyromegaly Lungs: Clear bilaterally.  No labored breathing or cough. Heart: RRR.  No MRG.  S1, S2 present. Abdomen: Nontender, nondistended, active bowel sounds.  Do not appreciate splenomegaly.  No bruits, hernias..   Rectal: Did not perform rectal exam but this was dark, bloody and FOBT positive per RN  At bedside Musc/Skeltl: No joint redness, swelling.  Some arthritic deformities in his fingers. Extremities: Nonpitting edema in the hands and feet.  Onychymycosis of the toenails. Neurologic: Alert.  Oriented x3.  Able to provide a good history though speech is pressured and a bit rambling.  Recalls names of his medications and details concerning past care. Skin: Sallow/ashen coloring. Nodes: No cervical adenopathy. Psych: Pressured somewhat rapid speech, cooperative.    Intake/Output from previous day: No intake/output data recorded. Intake/Output this shift: Total I/O In: 1622 [Blood:622; IV Piggyback:1000] Out: -   LAB RESULTS: Recent Labs    10/15/17 0622  WBC 13.7*  HGB 8.4*  HCT 24.2*  PLT 229   BMET Lab Results  Component Value Date   NA 146 (H) 10/15/2017   NA 134 (L) 11/14/2015  NA 137 01/21/2013   K 4.6 10/15/2017   K 5.1 11/14/2015   K 3.5 01/21/2013   CL 114 (H) 10/15/2017   CL 105 11/14/2015   CL 108 01/21/2013   CO2 25 10/15/2017   CO2 22 11/14/2015   CO2 20 01/21/2013   GLUCOSE 110 (H) 10/15/2017   GLUCOSE  100 (H) 11/14/2015   GLUCOSE 103 (H) 01/21/2013   BUN 46 (H) 10/15/2017   BUN 26 (H) 11/14/2015   BUN 43 (H) 01/21/2013   CREATININE 2.42 (H) 10/15/2017   CREATININE 2.43 (H) 11/14/2015   CREATININE 1.81 (H) 01/21/2013   CALCIUM 8.1 (L) 10/15/2017   CALCIUM 10.4 (H) 11/14/2015   CALCIUM 8.8 01/21/2013   LFT Recent Labs    10/15/17 0622  PROT 4.7*  ALBUMIN 2.5*  AST 21  ALT 11  ALKPHOS 47  BILITOT 1.5*   PT/INR Lab Results  Component Value Date   INR 2.07 10/15/2017   INR 1.23 12/28/2011   INR 1.26 11/07/2011   Hepatitis Panel No results for input(s): HEPBSAG, HCVAB, HEPAIGM, HEPBIGM in the last 72 hours. C-Diff No components found for: CDIFF Lipase     Component Value Date/Time   LIPASE 37 11/14/2015 0104    Drugs of Abuse     Component Value Date/Time   LABOPIA NONE DETECTED 01/19/2013 0015   COCAINSCRNUR NONE DETECTED 01/19/2013 0015   LABBENZ NONE DETECTED 01/19/2013 0015   AMPHETMU NONE DETECTED 01/19/2013 0015   THCU NONE DETECTED 01/19/2013 0015   LABBARB NONE DETECTED 01/19/2013 0015     RADIOLOGY STUDIES: Ct Abdomen Pelvis Wo Contrast  Result Date: 10/15/2017 CLINICAL DATA:  Rectal bleeding with hemoptysis and hypotension. History of ascites. EXAM: CT ABDOMEN AND PELVIS WITHOUT CONTRAST TECHNIQUE: Multidetector CT imaging of the abdomen and pelvis was performed following the standard protocol without IV contrast. COMPARISON:  06/23/2010 and ultrasound 07/13/2017 FINDINGS: Lower chest: Lung bases demonstrate no consolidation or effusion. Subtle focal cystic change over the right base. Increased AP diameter of the chest. Few small stable lymph nodes versus varices adjacent the distal esophagus unchanged. Hepatobiliary: Nodular contour of the liver. Mild cholelithiasis. Biliary tree is normal. Linear calcification adjacent the celiac axis likely vascular. Pancreas: Within normal. Spleen: Moderate splenomegaly. Adrenals/Urinary Tract: Adrenal glands are  normal. Kidneys are normal size without hydronephrosis or nephrolithiasis. Few small bilateral renal cysts. Ureters and bladder are normal. Stomach/Bowel: Stomach and small bowel are normal. Appendix is normal. Much of the colon is decompressed and is otherwise unremarkable. Vascular/Lymphatic: Minimal calcified plaque of the abdominal aorta. Retroaortic left renal vein. Perisplenic varices. No significant adenopathy. Reproductive: Normal. Other: Very small amount of perihepatic fluid/ascites. Musculoskeletal: Chronic changes of the right hip with severe degenerative change and heterotopic bone formation. Subtle lateral subluxation of the right hip unchanged. Mild degenerative change of the spine. Degenerative change of the left hip. IMPRESSION: No acute findings in the abdomen/pelvis. Evidence of known cirrhosis with very minimal ascites along with splenomegaly and mild perisplenic varices as well as possible mild esophageal varices. Mild cholelithiasis. Bilateral renal cysts. Aortic Atherosclerosis (ICD10-I70.0). Electronically Signed   By: Elberta Fortis M.D.   On: 10/15/2017 08:16     IMPRESSION:   *   Upper GI bleed with hematemesis and bloody stools.  Suspect variceal bleed if not that then possibly bleeding peptic ulcer.  Not on PPI at home but now started on IV drip Protonix and octreotide Patient refusing upper endoscopy.  *    Cirrhosis of the liver.  Hepatitis C treated within the last 2 years and per patient report virus eradicated.  *    Multiple psychiatric diagnoses including personality and schizoaffective disorder, PTSD.  Although alert and fully oriented, his thinking is not rational.    *    Coagulopathy.  Received IV vitamin K and is receiving FFP  *    ABL anemia but background of anemia of chronic disease.  *     CKD stage IIIb, stable compared with 10/2015, 2 years ago.    PLAN:     *   Has a slot set up for 1 PM today for upper endoscopy but unless he consents and agrees  to the procedure we will have to cancel this.  Dr. Rhea Belton is coming over to try to convince the patient to undergo EGD   Jennye Moccasin  10/15/2017, 9:38 AM Phone [phone]

## 2017-10-15 NOTE — ED Notes (Signed)
Bed: WA09 Expected date:  Expected time:  Means of arrival:  Comments: EMS GI bleed SBP 70

## 2017-10-16 DIAGNOSIS — K769 Liver disease, unspecified: Secondary | ICD-10-CM

## 2017-10-16 LAB — COMPREHENSIVE METABOLIC PANEL
ALK PHOS: 41 U/L (ref 38–126)
ALT: 11 U/L (ref 0–44)
ANION GAP: 4 — AB (ref 5–15)
AST: 17 U/L (ref 15–41)
Albumin: 2.5 g/dL — ABNORMAL LOW (ref 3.5–5.0)
BUN: 47 mg/dL — ABNORMAL HIGH (ref 6–20)
CALCIUM: 7.9 mg/dL — AB (ref 8.9–10.3)
CHLORIDE: 115 mmol/L — AB (ref 98–111)
CO2: 24 mmol/L (ref 22–32)
CREATININE: 2.18 mg/dL — AB (ref 0.61–1.24)
GFR, EST AFRICAN AMERICAN: 37 mL/min — AB (ref >60)
GFR, EST NON AFRICAN AMERICAN: 32 mL/min — AB (ref >60)
Glucose, Bld: 112 mg/dL — ABNORMAL HIGH (ref 70–99)
Potassium: 4.3 mmol/L (ref 3.5–5.1)
Sodium: 143 mmol/L (ref 135–145)
Total Bilirubin: 1.7 mg/dL — ABNORMAL HIGH (ref 0.3–1.2)
Total Protein: 4.4 g/dL — ABNORMAL LOW (ref 6.5–8.1)

## 2017-10-16 LAB — PREPARE FRESH FROZEN PLASMA
UNIT DIVISION: 0
Unit division: 0

## 2017-10-16 LAB — BPAM FFP
Blood Product Expiration Date: 201907252359
Blood Product Expiration Date: 201907252359
ISSUE DATE / TIME: 201907200853
ISSUE DATE / TIME: 201907200929
UNIT TYPE AND RH: 6200
Unit Type and Rh: 6200

## 2017-10-16 LAB — PROTIME-INR
INR: 1.57
PROTHROMBIN TIME: 18.6 s — AB (ref 11.4–15.2)

## 2017-10-16 LAB — CBC
HCT: 22.6 % — ABNORMAL LOW (ref 39.0–52.0)
HEMOGLOBIN: 7.8 g/dL — AB (ref 13.0–17.0)
MCH: 32.6 pg (ref 26.0–34.0)
MCHC: 34.5 g/dL (ref 30.0–36.0)
MCV: 94.6 fL (ref 78.0–100.0)
PLATELETS: 75 10*3/uL — AB (ref 150–400)
RBC: 2.39 MIL/uL — AB (ref 4.22–5.81)
RDW: 16.3 % — ABNORMAL HIGH (ref 11.5–15.5)
WBC: 5.4 10*3/uL (ref 4.0–10.5)

## 2017-10-16 LAB — HIV ANTIBODY (ROUTINE TESTING W REFLEX): HIV Screen 4th Generation wRfx: NONREACTIVE

## 2017-10-16 MED ORDER — ONDANSETRON 4 MG PO TBDP: 8.0000 mg | ORAL_TABLET | Freq: Three times a day (TID) | ORAL | Status: DC | PRN

## 2017-10-16 MED ORDER — CINACALCET HCL 30 MG PO TABS
30.0000 mg | ORAL_TABLET | Freq: Every day | ORAL | Status: DC
  Administered 2017-10-17 – 2017-10-18 (×2): 30 mg via ORAL
  Filled 2017-10-16 (×2): qty 1

## 2017-10-16 MED ORDER — HYDROCODONE-ACETAMINOPHEN 5-325 MG PO TABS
1.0000 | ORAL_TABLET | Freq: Four times a day (QID) | ORAL | Status: DC | PRN
  Administered 2017-10-16 – 2017-10-17 (×3): 1 via ORAL
  Filled 2017-10-16 (×3): qty 1

## 2017-10-16 MED ORDER — TRIHEXYPHENIDYL HCL 2 MG PO TABS
2.0000 mg | ORAL_TABLET | Freq: Three times a day (TID) | ORAL | Status: DC
Start: 2017-10-16 — End: 2017-10-18
  Administered 2017-10-16 – 2017-10-18 (×5): 2 mg via ORAL
  Filled 2017-10-16 (×6): qty 1

## 2017-10-16 MED ORDER — FLUPHENAZINE HCL 5 MG PO TABS
5.0000 mg | ORAL_TABLET | Freq: Three times a day (TID) | ORAL | Status: DC
  Administered 2017-10-16 – 2017-10-18 (×5): 5 mg via ORAL
  Filled 2017-10-16 (×6): qty 1

## 2017-10-16 MED ORDER — CIPROFLOXACIN HCL 500 MG PO TABS
250.0000 mg | ORAL_TABLET | Freq: Two times a day (BID) | ORAL | Status: DC
  Administered 2017-10-16 – 2017-10-18 (×4): 250 mg via ORAL
  Filled 2017-10-16 (×4): qty 1

## 2017-10-16 MED ORDER — PANTOPRAZOLE SODIUM 40 MG PO TBEC
40.0000 mg | DELAYED_RELEASE_TABLET | Freq: Two times a day (BID) | ORAL | Status: DC
  Administered 2017-10-16 – 2017-10-17 (×3): 40 mg via ORAL
  Filled 2017-10-16 (×3): qty 1

## 2017-10-16 NOTE — Progress Notes (Addendum)
Critical care progress note  Reason for admission: Hematemesis  History of present illness:  This is a 57 year old man who presented to the emergency with a 2-day history of hematemesis and blood per rectum.  He was admitted 7/21.  He has a history of liver cirrhosis and takes NSAIDs for arthritis.  He has schizoaffective disorder and PTSD following the [place] war.  He has refused upper endoscopy.  He was started on octreotide and pantoprazole infusions.  He has been transfused a total of 4 units of blood.  Scheduled Meds: . [START ON 10/17/2017] cinacalcet  30 mg Oral Q breakfast  . ciprofloxacin  250 mg Oral BID  . fluPHENAZine  5 mg Oral TID  . nicotine  14 mg Transdermal Daily  . pantoprazole  40 mg Oral BID  . trihexyphenidyl  2 mg Oral TID   Continuous Infusions: . sodium chloride    . sodium chloride 50 mL/hr at 10/16/17 0944   PRN Meds:sodium chloride, albuterol, fentaNYL (SUBLIMAZE) injection, haloperidol lactate, HYDROcodone-acetaminophen, ondansetron  Vital signs in last 24 hours: Temp:  [97.8 F (36.6 C)-100.7 F (38.2 C)] 98.1 F (36.7 C) (07/21 1200) Pulse Rate:  [61-76] 61 (07/21 1800) Resp:  [13-26] 13 (07/21 1800) BP: (77-119)/(34-58) 119/58 (07/21 1800) SpO2:  [94 %-98 %] 95 % (07/21 1800) Weight:  [259 lb 4.2 oz (117.6 kg)] 259 lb 4.2 oz (117.6 kg) (07/21 0500)  Intake/Output last 3 shifts: I/O last 3 completed shifts: In: 7106.3 [P.O.:1320; I.V.:760.8; Blood:2829.6; IV Piggyback:2195.8] Out: 6283 [Urine:1675] Intake/Output this shift: Total I/O In: 2844.2 [P.O.:960; I.V.:1764.2; IV Piggyback:120] Out: 1002 [Urine:1000; Stool:2]  Problem Assessment/Plan Digestive Acute upper gastrointestinal bleeding Assessment & Plan No further emesis since admission.  Only witnessed hematemesis revealed coffee grounds and old blood in the ED.  Currently denies abdominal pain.  Has tolerated clear liquids. Continues to have melena.  Patient continues to refuse  upper endoscopy.   On examination: Abdomen is soft and nontender.  Bowel sounds are active.  Clinically well perfused.  Chest is clear.  Heart sounds are unremarkable.  Peripheral edema is stable.  CBC Latest Ref Rng & Units 10/16/2017 10/15/2017 10/15/2017  WBC 4.0 - 10.5 K/uL 5.4 - 13.7(H)  Hemoglobin 13.0 - 17.0 g/dL 7.8(L) 7.0(L) 8.4(L)  Hematocrit 39.0 - 52.0 % 22.6(L) 20.4(L) 24.2(L)  Platelets 150 - 400 K/uL 75(L) - 229   INR is 1.57 following FFP and vitamin K.  Assessment and plan: No signs of further active bleeding.  Patient may continue to pass melena for up to 48h.  Patient is refusing endoscopy.  We will therefore treat medically.  Discontinued octreotide infusion and switch to oral PPI.  Follow serial CBC.  If remains stable may be transferred to floor and discharged following successful ambulation.  Have advance diet to clear liquids.  If tolerates can advance to full diet tomorrow.   Liver disease Assessment & Plan Patient reports that liver disease has been cured (HCV treatment).  CT notes evidence of cirrhosis.  Prior history of ascites with paracentesis several years ago.  On examination: No evidence of tense ascites.  Gynecomastia but no other signs of chronic liver disease.  Peripheral edema.  No asterixis.  Hepatic Function Latest Ref Rng & Units 10/16/2017 10/15/2017 11/14/2015  Total Protein 6.5 - 8.1 g/dL 4.4(L) 4.7(L) 7.1  Albumin 3.5 - 5.0 g/dL 2.5(L) 2.5(L) 4.1  AST 15 - 41 U/L '17 21 27  '$ ALT 0 - 44 U/L '11 11 18  '$ Alk Phosphatase 38 -  126 U/L 41 47 88  Total Bilirubin 0.3 - 1.2 mg/dL 1.7(H) 1.5(H) 0.6  Bilirubin, Direct 0.0 - 0.3 mg/dL - - -   Assessment and plan: Well compensated liver disease.  Monitor for hepatic encephalopathy given protein load from recent GI bleed.   Other Schizoaffective disorder [facility]) Assessment & Plan History of schizoaffective disorder secondary to PTSD from wartime trauma.  He is requesting his psychiatric medication.  On  examination: Patient is calm and cooperative.  Does make significant requests of the staff.  Assessment and plan: No acute psychiatric decompensation.  Personality appears to be at baseline.  Continue home psychiatric medication.  Will expedite inpatient care as patient is likely to want to leave before medical staff would normally discharge him.  Kipp Brood, MD [facility] ICU Physician [place]  Pager: [phone] Mobile: [phone] After hours: [phone].

## 2017-10-16 NOTE — Progress Notes (Signed)
Daily Rounding Note  10/16/2017, 8:01 AM  LOS: 1 day   SUBJECTIVE:   Chief complaint: None    Last BM, hematemesis was daytime yesterday.  Denies nausea, abd pain, dizziness (though has been in bed mostly).  Tolerating clears. Still declines, refuses EGD "camera".    OBJECTIVE:         Vital signs in last 24 hours:    Temp:  [97.8 F (36.6 C)-100.7 F (38.2 C)] 98.9 F (37.2 C) (07/21 0354) Pulse Rate:  [64-98] 66 (07/21 0400) Resp:  [11-27] 18 (07/21 0400) BP: (77-157)/(34-85) 111/48 (07/21 0400) SpO2:  [88 %-100 %] 96 % (07/21 0400) Weight:  [259 lb 4.2 oz (117.6 kg)] 259 lb 4.2 oz (117.6 kg) (07/21 0500) Last BM Date: 10/15/17 Filed Weights   10/15/17 0614 10/16/17 0500  Weight: 250 lb (113.4 kg) 259 lb 4.2 oz (117.6 kg)   General: alert, comfortable, dishevelled   Heart: RRR Chest: clear bil.  No labored breathing Abdomen: soft, NT, active BS.  Extremities: non-pitting mild pedal, ankle swelling.  Feet warm. Neuro/Psych:  Oriented to place, self, situation, time etc.  Odd affect.    Intake/Output from previous day: 07/20 0701 - 07/21 0700 In: 7106.3 [P.O.:1320; I.V.:760.8; Blood:2829.6; IV Piggyback:2195.8] Out: 1675 [Urine:1675]  Intake/Output this shift: No intake/output data recorded.  Lab Results: Recent Labs    10/15/17 0622 10/15/17 1358 10/16/17 0352  WBC 13.7*  --  5.4  HGB 8.4* 7.0* 7.8*  HCT 24.2* 20.4* 22.6*  PLT 229  --  75*   BMET Recent Labs    10/15/17 0622 10/16/17 0352  NA 146* 143  K 4.6 4.3  CL 114* 115*  CO2 25 24  GLUCOSE 110* 112*  BUN 46* 47*  CREATININE 2.42* 2.18*  CALCIUM 8.1* 7.9*   LFT Recent Labs    10/15/17 0622 10/16/17 0352  PROT 4.7* 4.4*  ALBUMIN 2.5* 2.5*  AST 21 17  ALT 11 11  ALKPHOS 47 41  BILITOT 1.5* 1.7*   PT/INR Recent Labs    10/15/17 0649 10/16/17 0352  LABPROT 23.2* 18.6*  INR 2.07 1.57   Hepatitis Panel No results for  input(s): HEPBSAG, HCVAB, HEPAIGM, HEPBIGM in the last 72 hours.  Studies/Results: Ct Abdomen Pelvis Wo Contrast  Result Date: 10/15/2017 CLINICAL DATA:  Rectal bleeding with hemoptysis and hypotension. History of ascites. EXAM: CT ABDOMEN AND PELVIS WITHOUT CONTRAST TECHNIQUE: Multidetector CT imaging of the abdomen and pelvis was performed following the standard protocol without IV contrast. COMPARISON:  06/23/2010 and ultrasound 07/13/2017 FINDINGS: Lower chest: Lung bases demonstrate no consolidation or effusion. Subtle focal cystic change over the right base. Increased AP diameter of the chest. Few small stable lymph nodes versus varices adjacent the distal esophagus unchanged. Hepatobiliary: Nodular contour of the liver. Mild cholelithiasis. Biliary tree is normal. Linear calcification adjacent the celiac axis likely vascular. Pancreas: Within normal. Spleen: Moderate splenomegaly. Adrenals/Urinary Tract: Adrenal glands are normal. Kidneys are normal size without hydronephrosis or nephrolithiasis. Few small bilateral renal cysts. Ureters and bladder are normal. Stomach/Bowel: Stomach and small bowel are normal. Appendix is normal. Much of the colon is decompressed and is otherwise unremarkable. Vascular/Lymphatic: Minimal calcified plaque of the abdominal aorta. Retroaortic left renal vein. Perisplenic varices. No significant adenopathy. Reproductive: Normal. Other: Very small amount of perihepatic fluid/ascites. Musculoskeletal: Chronic changes of the right hip with severe degenerative change and heterotopic bone formation. Subtle lateral subluxation of the right hip unchanged. Mild degenerative  change of the spine. Degenerative change of the left hip. IMPRESSION: No acute findings in the abdomen/pelvis. Evidence of known cirrhosis with very minimal ascites along with splenomegaly and mild perisplenic varices as well as possible mild esophageal varices. Mild cholelithiasis. Bilateral renal cysts. Aortic  Atherosclerosis (ICD10-I70.0). Electronically Signed   By: Elberta Fortis M.D.   On: 10/15/2017 08:16   Scheduled Meds: . nicotine  14 mg Transdermal Daily   Continuous Infusions: . sodium chloride    . sodium chloride 50 mL/hr at 10/15/17 1206  . cefTRIAXone (ROCEPHIN)  IV Stopped (10/15/17 1722)  . octreotide  (SANDOSTATIN)    IV infusion 50 mcg/hr (10/16/17 0706)  . pantoprozole (PROTONIX) infusion 8 mg/hr (10/15/17 2226)   PRN Meds:.sodium chloride, albuterol, fentaNYL (SUBLIMAZE) injection, haloperidol lactate   ASSESMENT:   *   Hematemesis, hematochezia/melana.  UGIB Pt refuses EGD.  On PPI and octreotide drip day 2, Rocephin day 2.  .     *   Cirrhosis.  Minor ascites on CT.  Child's score 11 at arrival.    *  ABL anemia.  S/p PRBC 3 U.    *  Coagulopathy.  S/p FFP 2 U, Vit K.  Improved.    *   Thrombocytopenia.    *  Psychiatric d/o.  Lacks insight into current severity of medical illness  *   CKD   PLAN   *    Continue octreotide and PPI drip for 72 hours.  Leave clears in place.   ? Psych eval to determine competency if incmpetent, do we override his decision not to perform EGD?  *   CBC, coags, CMET in AM.      Jennye Moccasin  10/16/2017, 8:01 AM Phone [phone]

## 2017-10-16 NOTE — Progress Notes (Signed)
On assessment, patient's IV's appear to be infiltrated. Pt. receiving continuous infusions of Octreotide and Protonix. Pt. refusing to let either RN or charge RN check his IV's out or remove it. According to pt. " It's my body ,my skin. I know if the IV is leaking."Stopped all drips. Placed IV team consult. Will continue to monitor.

## 2017-10-16 NOTE — Assessment & Plan Note (Signed)
No further emesis since admission.  Only witnessed hematemesis revealed coffee grounds and old blood in the ED.  Currently denies abdominal pain.  Has tolerated clear liquids. Continues to have melena.  Patient continues to refuse upper endoscopy.   On examination: Abdomen is soft and nontender.  Bowel sounds are active.  Clinically well perfused.  Chest is clear.  Heart sounds are unremarkable.  Peripheral edema is stable.  CBC Latest Ref Rng & Units 10/16/2017 10/15/2017 10/15/2017  WBC 4.0 - 10.5 K/uL 5.4 - 13.7(H)  Hemoglobin 13.0 - 17.0 g/dL 7.8(L) 7.0(L) 8.4(L)  Hematocrit 39.0 - 52.0 % 22.6(L) 20.4(L) 24.2(L)  Platelets 150 - 400 K/uL 75(L) - 229   INR is 1.57 following FFP and vitamin K.  Assessment and plan: No signs of further active bleeding.  Patient may continue to pass melena for up to 48h.  Patient is refusing endoscopy.  We will therefore treat medically.  Discontinued octreotide infusion and switch to oral PPI.  Follow serial CBC.  If remains stable may be transferred to floor and discharged following successful ambulation.  Have advance diet to clear liquids.  If tolerates can advance to full diet tomorrow.

## 2017-10-16 NOTE — H&P (Deleted)
PULMONARY / CRITICAL CARE MEDICINE   Name: [name] MRN: [id number] DOB: 02-09-1961    ADMISSION DATE:  10/15/2017 CONSULTATION DATE:  10/15/2017  REFERRING MD:  Clarene Duke RM - [facility] ED  CHIEF COMPLAINT:  Hematemesis.  HISTORY OF PRESENT ILLNESS:   57 year old man with prior history of HCV cirrhosis and schizoaffective disorder.  No prior history for GIB in past.  Prior admissions for ascites but no recent liver-related visits.  Prior admissions for suicidality.  Presented today with 3 days of vomiting blood.  Cannot specified whether this was bright  or dark blood.  Reports bloody bowel movements every 2-3 hours.  Reports mild upper abdominal pain.  He is felt dizzy.  During this time he has not been eating or drinking well.  PAST MEDICAL HISTORY :  He  has a past medical history of Anemia, Ascites, Chronic leg pain, Cirrhosis (HCC), GERD (gastroesophageal reflux disease), H/O diabetes insipidus, Hepatitis C, History of adrenal insufficiency, History of ARDS, History of blood transfusion, Hypothyroidism, Kidney disease, Overdose (12/2009), Peripheral neuropathy, Personality disorder (HCC), PTSD (post-traumatic stress disorder), and Schizoaffective disorder (HCC).  PAST SURGICAL HISTORY: He  has a past surgical history that includes RIGHT HIP SURGERY (1996); US GUIDED LEFT THORACENTESIS (01/21/2010); and Cervical spine surgery (unknown).  Allergies  Allergen Reactions  . Heparin Other (See Comments)    unknown  . Imipramine Other (See Comments)    unknown  . Lamictal [Lamotrigine] Other (See Comments)    unknown  . Penicillins Other (See Comments)    Pt can not remember allergy and is unable to review questions    No current facility-administered medications on file prior to encounter.    Current Outpatient Medications on File Prior to Encounter  Medication Sig  . diphenhydrAMINE (BENADRYL) 25 MG tablet Take 25 mg by mouth every 6 (six) hours as needed for allergies or sleep.  .  fluPHENAZine (PROLIXIN) 5 MG tablet Take 5 mg by mouth 3 (three) times daily.  . furosemide (LASIX) 20 MG tablet Take 20 mg by mouth daily.   Marland Kitchen gabapentin (NEURONTIN) 100 MG capsule Take 200 mg by mouth 3 (three) times daily.  Marland Kitchen oxyCODONE (OXY IR/ROXICODONE) 5 MG immediate release tablet Take 1 tablet (5 mg total) by mouth every 6 (six) hours as needed for severe pain.  . SENSIPAR 30 MG tablet Take 30 mg by mouth daily.  . trihexyphenidyl (ARTANE) 2 MG tablet Take 2 mg by mouth 3 (three) times daily.  Marland Kitchen HYDROcodone-acetaminophen (NORCO/VICODIN) 5-325 MG per tablet Take 1 tablet by mouth every 6 (six) hours as needed for moderate pain. (Patient not taking: Reported on 09/18/2015)  . ibuprofen (ADVIL,MOTRIN) 600 MG tablet Take 1 tablet (600 mg total) by mouth every 6 (six) hours as needed. (Patient not taking: Reported on 09/08/2017)  . ondansetron (ZOFRAN ODT) 8 MG disintegrating tablet Take 1 tablet (8 mg total) by mouth every 8 (eight) hours as needed for nausea or vomiting. (Patient not taking: Reported on 09/08/2017)  . oxyCODONE-acetaminophen (PERCOCET) 7.5-325 MG tablet Take 1 tablet by mouth every 6 (six) hours as needed for severe pain. (Patient not taking: Reported on 09/18/2015)  . traMADol (ULTRAM) 50 MG tablet Take 1 tablet (50 mg total) by mouth every 6 (six) hours as needed. (Patient not taking: Reported on 09/08/2017)    FAMILY HISTORY:  His family history is not on file.  SOCIAL HISTORY: He  reports that he has been smoking cigarettes.  He has a 25.00 pack-year smoking history.  He has never used smokeless tobacco. He reports that he does not drink alcohol or use drugs.  REVIEW OF SYSTEMS:   Review of Systems  Constitutional: Positive for malaise/fatigue. Negative for chills and fever.  HENT: Negative.   Eyes: Negative.   Respiratory: Negative.   Cardiovascular: Negative.   Gastrointestinal: Positive for abdominal pain, blood in stool and vomiting.  Genitourinary: Negative.    Musculoskeletal: Positive for joint pain.  Skin: Negative.   Neurological: Positive for dizziness and weakness.  Endo/Heme/Allergies: Negative.   Psychiatric/Behavioral: Positive for depression.    COURSE IN HOSPITAL:    VITAL SIGNS: BP (!) 119/58   Pulse 61   Temp 98.1 F (36.7 C) (Axillary)   Resp 13   Ht 6\' 6"  (1.981 m)   Wt 259 lb 4.2 oz (117.6 kg)   SpO2 95%   BMI 29.96 kg/m   HEMODYNAMICS:  no vasopressors  VENTILATOR SETTINGS:  room air.  INTAKE / OUTPUT: I/O last 3 completed shifts: In: 7106.3 [P.O.:1320; I.V.:760.8; Blood:2829.6; IV Piggyback:2195.8] Out: 1675 [Urine:1675]  PHYSICAL EXAMINATION: General:  Corpulent male, ill-kempt, appears stated age. Neuro: Awake alert and conversant.  Vague historian but cooperative and answers questions appropriately.  No asterixis.  No focal motor deficits. HEENT: No scleral icterus.  Oral mucosa coated with vomitus. Cardiovascular: Extremities warm.  JVP flat.  Heart sounds unremarkable. Lungs: Chest clear to auscultation. Abdomen: Abdom flaccid.  No signs of ascites.  No palpable organomegaly.  No flank dullness.  Positive stigmata of liver disease: Gynecomastia, spider nevi. Dark red and coffee ground emesis noted. Musculoskeletal: Moderate bilateral lower extremity edema.  No active joint disease. Skin: Dry skin.  No ecchymoses.  Prior cervical and hip surgery incision noted.  LABS:  BMET Recent Labs  Lab 10/15/17 0622 10/16/17 0352  NA 146* 143  K 4.6 4.3  CL 114* 115*  CO2 25 24  BUN 46* 47*  CREATININE 2.42* 2.18*  GLUCOSE 110* 112*    Electrolytes Recent Labs  Lab 10/15/17 0622 10/16/17 0352  CALCIUM 8.1* 7.9*    CBC Recent Labs  Lab 10/15/17 0622 10/15/17 1358 10/16/17 0352  WBC 13.7*  --  5.4  HGB 8.4* 7.0* 7.8*  HCT 24.2* 20.4* 22.6*  PLT 229  --  75*    Coag's Recent Labs  Lab 10/15/17 0649 10/16/17 0352  APTT 32  --   INR 2.07 1.57    Sepsis Markers No results for  input(s): LATICACIDVEN, PROCALCITON, O2SATVEN in the last 168 hours.  ABG No results for input(s): PHART, PCO2ART, PO2ART in the last 168 hours.  Liver Enzymes Recent Labs  Lab 10/15/17 0622 10/16/17 0352  AST 21 17  ALT 11 11  ALKPHOS 47 41  BILITOT 1.5* 1.7*  ALBUMIN 2.5* 2.5*    Cardiac Enzymes No results for input(s): TROPONINI, PROBNP in the last 168 hours.  Glucose No results for input(s): GLUCAP in the last 168 hours.  Imaging No results found.  STUDIES:  EGD pending  CULTURES: N/A  ANTIBIOTICS: Not applicable  SIGNIFICANT EVENTS: Not applicable  LINES/TUBES: Peripheral IVs  DISCUSSION:  57 year old man with acute upper GI bleed in context of known cirrhosis. Hemodynamically stable and does not appear to be bleeding briskly.  High risk Blatchford score of 15 - requires endoscopic evaluation and warrants ICU admission.   ASSESSMENT / PLAN:  PULMONARY A: Active smoker but no symptoms at this time.  Able to protect airway. No evidence of significant aspiration at this time. P:  Nicotine replacement. Early mobilization  Monitor pulmonary status following blood administration.  CARDIOVASCULAR A:  Marginal BP but clinically adequately perfused. Lower BP may be normal given liver disease. P:  Blood product resuscitation for hypotension in the presence of active bleeding.  RENAL A:   Chronic renal insufficiency stage III. Creatinine remains stable. P:   Consider Foley if clinical perfusion appears compromised.  GASTROINTESTINAL A:   Upper GI bleed. Differential includes: PUD, Portal gastropathy or variceal bleed. Chronic liver disease which appears well compensated at present. P:   Empiric IV pantoprazole BID and octreotide infusion pending EGD.  HEMATOLOGIC A:   Reactive leukocytosis, acute blood loss anemia.  INR significantly elevated compared to 2013. P:  Transfuse for HB< 9.0 Correct coagulopathy.  FFP acutely prior to EGD. Vitamin  K.  INFECTIOUS A:   No signs of active infection. P:   Antibiotic prophylaxis for SBP with ceftriaxone 1g/24h until able to take oral.  ENDOCRINE A:   Euglycemic with no history of DM.  History of adrenal insufficiency but no chronic replacement at this time.   P:   Add stress steroids if BP fails to respond to volume administration.  NEUROLOGIC A:   Schizoaffective disorder but no sign of hepatic encephalopathy. P:   Resume home psychiatric medications once able to take orals.  FAMILY  - Updates: no family at bedside. Patient lives with his mother.  - Inter-disciplinary family meet or Palliative Care meeting due by:  day 7.  Lynnell Catalan, MD [facility] ICU Physician [facility] [place] [facility]  Pager: [phone] Mobile: [phone] After hours: [phone].   10/16/2017, 6:34 PM

## 2017-10-16 NOTE — Progress Notes (Signed)
Day shift update- Patient has had a total of 3 BM's this shift. Stools were black and tarrry, small to medium amount. No emesis. Patient has been given IV Fentanyl x 3 for c/o bilateral leg pain. IV team was able to get 2 PIV's. Patient allowed RN (after much education) to remove the infiltrated IV's. IV Octreotide & IV Protonix discontinued. Oral Protonix given to patient. Will continue to monitor.

## 2017-10-16 NOTE — Assessment & Plan Note (Signed)
History of schizoaffective disorder secondary to PTSD from wartime trauma.  He is requesting his psychiatric medication.  On examination: Patient is calm and cooperative.  Does make significant requests of the staff.  Assessment and plan: No acute psychiatric decompensation.  Personality appears to be at baseline.  Continue home psychiatric medication.  Will expedite inpatient care as patient is likely to want to leave before medical staff would normally discharge him.

## 2017-10-17 ENCOUNTER — Encounter (HOSPITAL_COMMUNITY): Payer: Self-pay

## 2017-10-17 DIAGNOSIS — R188 Other ascites: Secondary | ICD-10-CM

## 2017-10-17 DIAGNOSIS — K922 Gastrointestinal hemorrhage, unspecified: Secondary | ICD-10-CM

## 2017-10-17 DIAGNOSIS — D688 Other specified coagulation defects: Secondary | ICD-10-CM

## 2017-10-17 DIAGNOSIS — K921 Melena: Principal | ICD-10-CM

## 2017-10-17 DIAGNOSIS — K7460 Unspecified cirrhosis of liver: Secondary | ICD-10-CM

## 2017-10-17 DIAGNOSIS — D62 Acute posthemorrhagic anemia: Secondary | ICD-10-CM

## 2017-10-17 DIAGNOSIS — D696 Thrombocytopenia, unspecified: Secondary | ICD-10-CM

## 2017-10-17 DIAGNOSIS — Z008 Encounter for other general examination: Secondary | ICD-10-CM

## 2017-10-17 DIAGNOSIS — K920 Hematemesis: Secondary | ICD-10-CM

## 2017-10-17 DIAGNOSIS — R1084 Generalized abdominal pain: Secondary | ICD-10-CM

## 2017-10-17 LAB — CBC
HCT: 20.2 % — ABNORMAL LOW (ref 39.0–52.0)
Hemoglobin: 6.9 g/dL — CL (ref 13.0–17.0)
MCH: 32.9 pg (ref 26.0–34.0)
MCHC: 34.2 g/dL (ref 30.0–36.0)
MCV: 96.2 fL (ref 78.0–100.0)
Platelets: 57 10*3/uL — ABNORMAL LOW (ref 150–400)
RBC: 2.1 MIL/uL — ABNORMAL LOW (ref 4.22–5.81)
RDW: 16.4 % — AB (ref 11.5–15.5)
WBC: 2.1 10*3/uL — ABNORMAL LOW (ref 4.0–10.5)

## 2017-10-17 LAB — COMPREHENSIVE METABOLIC PANEL
ALBUMIN: 2.4 g/dL — AB (ref 3.5–5.0)
ALT: 10 U/L (ref 0–44)
ANION GAP: 5 (ref 5–15)
AST: 21 U/L (ref 15–41)
Alkaline Phosphatase: 36 U/L — ABNORMAL LOW (ref 38–126)
BILIRUBIN TOTAL: 0.6 mg/dL (ref 0.3–1.2)
BUN: 32 mg/dL — AB (ref 6–20)
CALCIUM: 7.8 mg/dL — AB (ref 8.9–10.3)
CO2: 23 mmol/L (ref 22–32)
CREATININE: 1.95 mg/dL — AB (ref 0.61–1.24)
Chloride: 116 mmol/L — ABNORMAL HIGH (ref 98–111)
GFR calc Af Amer: 42 mL/min — ABNORMAL LOW (ref >60)
GFR calc non Af Amer: 36 mL/min — ABNORMAL LOW (ref >60)
GLUCOSE: 134 mg/dL — AB (ref 70–99)
Potassium: 3.9 mmol/L (ref 3.5–5.1)
Sodium: 144 mmol/L (ref 135–145)
TOTAL PROTEIN: 4.3 g/dL — AB (ref 6.5–8.1)

## 2017-10-17 LAB — PROTIME-INR
INR: 2.36
PROTHROMBIN TIME: 25.6 s — AB (ref 11.4–15.2)

## 2017-10-17 LAB — AMMONIA: Ammonia: 35 umol/L (ref 9–35)

## 2017-10-17 LAB — PREPARE RBC (CROSSMATCH)

## 2017-10-17 MED ORDER — OXYCODONE-ACETAMINOPHEN 7.5-325 MG PO TABS
1.0000 | ORAL_TABLET | Freq: Four times a day (QID) | ORAL | Status: DC | PRN
  Administered 2017-10-17 – 2017-10-18 (×4): 1 via ORAL
  Filled 2017-10-17 (×4): qty 1

## 2017-10-17 MED ORDER — SODIUM CHLORIDE 0.9% IV SOLUTION
Freq: Once | INTRAVENOUS | Status: AC
  Administered 2017-10-17: 06:00:00 via INTRAVENOUS

## 2017-10-17 MED ORDER — SODIUM CHLORIDE 0.9 % IV SOLN
8.0000 mg/h | INTRAVENOUS | Status: DC
  Administered 2017-10-17 – 2017-10-18 (×3): 8 mg/h via INTRAVENOUS
  Filled 2017-10-17 (×5): qty 80

## 2017-10-17 MED ORDER — PHYTONADIONE 5 MG PO TABS
10.0000 mg | ORAL_TABLET | Freq: Every day | ORAL | Status: DC
  Administered 2017-10-17 – 2017-10-18 (×2): 10 mg via ORAL
  Filled 2017-10-17 (×2): qty 2

## 2017-10-17 MED ORDER — SODIUM CHLORIDE 0.9 % IV SOLN
50.0000 ug/h | INTRAVENOUS | Status: DC
  Administered 2017-10-17 – 2017-10-18 (×3): 50 ug/h via INTRAVENOUS
  Filled 2017-10-17 (×4): qty 1

## 2017-10-17 NOTE — Progress Notes (Signed)
Now seen By Dr Sharma Covert w/ Psychiatry.  She does not feel that he has the capacity to make his medical decisions.  I notified the GI team Also reached out to his mother (who does not speak english)  Plan We will continue current supportive care (octreotide, PPI gtt, serial CBGs) Mother to come 7/23 we will have a translator present  GI on stand bye IF he were to have obvious bleeding we would proceed w/ EGD emergently; ideally would prefer to have family on board.  If his requests to be discharged become threats to go home we will need to IVC him  Simonne Martinet ACNP-BC [facility] Pulmonary/Critical Care Pager # [phone] OR # [phone] if no answer

## 2017-10-17 NOTE — Progress Notes (Addendum)
Progress Note   Subjective  Chief Complaint: Hematemesis  This morning, found laying in bed getting another unit of blood. All he tells me this morning is that "I want to be discharged, I stopped bleeding and I don't see what you are doing for me anymore. I want to be discharged".   Per nursing, he has not had a psych consult. He had 3-4 more melenic stools throughout the day yesterday and no further through the night.    Objective   Vital signs in last 24 hours: Temp:  [97.4 F (36.3 C)-98.1 F (36.7 C)] 97.6 F (36.4 C) (07/22 0800) Pulse Rate:  [56-65] 59 (07/22 0630) Resp:  [12-22] 12 (07/22 0630) BP: (95-119)/(34-65) 107/62 (07/22 0630) SpO2:  [95 %-98 %] 97 % (07/22 0630) Weight:  [256 lb 9.9 oz (116.4 kg)] 256 lb 9.9 oz (116.4 kg) (07/22 0407) Last BM Date: 10/16/17 General:    white male in NAD Heart:  Regular rate and rhythm; no murmurs Lungs: Respirations even and unlabored, lungs CTA bilaterally Abdomen:  Soft, nontender and nondistended. Normal bowel sounds. Extremities:  Without edema. Neurologic:  Alert and oriented,  grossly normal neurologically. Psych: Uncooperative  Intake/Output from previous day: 07/21 0701 - 07/22 0700 In: 3084.2 [P.O.:1200; I.V.:1764.2; IV Piggyback:120] Out: 4098 [JXBJY:7829; Stool:2]  Lab Results: Recent Labs    10/15/17 0622 10/15/17 1358 10/16/17 0352 10/17/17 0347  WBC 13.7*  --  5.4 2.1*  HGB 8.4* 7.0* 7.8* 6.9*  HCT 24.2* 20.4* 22.6* 20.2*  PLT 229  --  75* 57*   BMET Recent Labs    10/15/17 0622 10/16/17 0352 10/17/17 0347  NA 146* 143 144  K 4.6 4.3 3.9  CL 114* 115* 116*  CO2 25 24 23   GLUCOSE 110* 112* 134*  BUN 46* 47* 32*  CREATININE 2.42* 2.18* 1.95*  CALCIUM 8.1* 7.9* 7.8*   LFT Recent Labs    10/17/17 0347  PROT 4.3*  ALBUMIN 2.4*  AST 21  ALT 10  ALKPHOS 36*  BILITOT 0.6   PT/INR Recent Labs    10/16/17 0352 10/17/17 0347  LABPROT 18.6* 25.6*  INR 1.57 2.36     Assessment /  Plan:   Assessment: 1.  Hematemesis with melena: Last melena was yesterday during the day, no further hematemesis, no melena overnight, hemoglobin did drop another 7.8--> 6.9 this morning, patient currently receiving another unit of PRBCs, status post 5 units in total 2.  Cirrhosis: Minor ascites on CT, child score 11 at arrival 3.  Acute blood loss anemia 4.  Coagulopathy: Status post FFP 2 units, vitamin K, improved 5.  Thrombocytopenia 6.  Psychiatric D/O 7.  CKD  Plan: 1.  Continue Octreotide and PPI drip for 72 hours, leave on clears 2.  Agree with continued monitoring of hemoglobin and transfusion as needed 3.  Tried to have discussion with patient in regards to what medications he is on and what they are doing for him as far as stopping his current bleeding, also rediscussed an EGD which he continues to decline this morning.  Patient does not talk much other than tells me that he wants to leave today.  I told him I would need to discuss this with his other physicians.  If he did so it  would be AGAINST MEDICAL ADVICE. 4. Again recommend psych consult, discussed this with critical care this morning and they have ordered, ?language barrier as well? 5.  Please await any further recommendations from Dr. 10/19/17 later  today  Thanks for your consult, please let us know if we may be of any further assistance.    LOS: 2 days   Unk Lightning  10/17/2017, 8:57 AM  Pager # [phone]     Attending physician's note   I have taken an interval history, reviewed the chart and examined the patient. I agree with the Advanced Practitioner's note, impression and recommendations. Hematemesis, melena and ABL anemia in setting of cirrhosis. Hb decrease today noted. He continues to adamantly refuse EGD. Risk of life threatening recurrent/ongoing bleeding with varices, ulcer or other sources reviewed. Benefits of EGD to diagnose and treat bleeding lesions reviewed. This information and discussion  had no apparent impact on his steadfast refusal of EGD. He appeared frustrated and mentioned leaving the hospital. Advised that this would be AMA. Case discussed with CCM and psychiatry is being consulted. Would continue to support with octreotide and PPI infusions.   Claudette Head, MD FACG [phone] office

## 2017-10-17 NOTE — Consult Note (Signed)
[facility] Psychiatry Consult   Reason for Consult:  Capacity evaluation Referring Physician:  Dr. Lynetta Mare Patient Identification: [name] MRN:  [id number] Principal Diagnosis: Evaluation by psychiatric service required Diagnosis:   Patient Active Problem List   Diagnosis Date Noted  . Acute upper gastrointestinal bleeding [K92.2] 10/15/2017  . PTSD (post-traumatic stress disorder) [F43.10]   . Hypothyroidism [E03.9]   . History of adrenal insufficiency [Z86.39]   . H/O diabetes insipidus [Z86.39]   . Schizoaffective disorder ([place]) [F25.9] 11/08/2011  . Personality disorder ([place]) [F60.9]   . Anemia [D64.9]   . Ascites [R18.8]   . Liver disease [K76.9]   . Hepatitis C [B19.20]   . Kidney disease [N28.9]   . GERD (gastroesophageal reflux disease) [K21.9]   . Psychosis ([place]) [F29] 11/07/2011    Total Time spent with patient: 1 hour  Subjective:   [name] is a 57 y.o. male patient admitted with hematemesis and upper GI bleeding thought secondary to variceal bleeding.  HPI:   Per chart review, patient was admitted with hematemesis and upper GI bleeding thought secondary to variceal bleeding. Hemoglobin continues to drop although patient is refusing medical care such as upper endoscopy and requesting discharge. He does not believe that he is bleeding although he has been requiring multiple blood transfusions (hemoglobin 6.9 today down from 7.8 yesterday).  Of note, patient was last seen in 2014 for SI, HI and psychosis. He was noted to speak in his native language. He was recommended for inpatient psychiatric hospitalization but was later determined to be at his baseline and his presentation was likely secondary to cognitive dysfunction.   On interview, [name] reports that he was eating a hamburger when he developed diarrhea and vertigo.  He reports that he came to the hospital.  He was told that he had hypotension and blood in his stool.  He believes that he is not  currently bleeding because he no longer has blood in his stool.  Attempted to educate the patient about internal bleeding although he did not appear to understand.  He reports that his mood is okay.  He denies problems with sleep or appetite.  He reports that he enjoys learning [place].  He is looking for employment.  He reports that he was wheelchair bound for 5 years but has been getting stronger and is now able to walk.  He has been going to gym.  He denies SI, HI or AVH.    Past Psychiatric History: Schizoaffective disorder, personality disorder and PTSD. He has a history of overdose.   Risk to Self:  None. Denies SI. Risk to Others:  None. Denies HI.  Prior Inpatient Therapy:  He was hospitalized 3 years ago for schizophrenia and PTSD. This was following the death of his father.  Prior Outpatient Therapy:  He is followed by Dr. Para Skeans.   Past Medical History:  Past Medical History:  Diagnosis Date  . Anemia   . Ascites   . Chronic leg pain   . Cirrhosis ([place])   . GERD (gastroesophageal reflux disease)   . H/O diabetes insipidus   . Hepatitis C    Completed therapy with [facility] ~ 2018.  suspect therapy overseen by [facility] in [place], [place]  . History of adrenal insufficiency   . History of ARDS   . History of blood transfusion   . Hypothyroidism   . Kidney disease   . Overdose 12/2009  . Peripheral neuropathy   . Personality disorder ([place])   .  PTSD (post-traumatic stress disorder)    SECONDARY TO WAR IN [place]  . Schizoaffective disorder [facility])     Past Surgical History:  Procedure Laterality Date  . CERVICAL SPINE SURGERY  unknown  . RIGHT HIP SURGERY  1996  . US GUIDED LEFT THORACENTESIS  01/21/2010   Family History: History reviewed. No pertinent family history. Family Psychiatric  History: Denies  Social History:  Social History   Substance and Sexual Activity  Alcohol Use No   Comment: Pt denies      Social History   Substance and Sexual  Activity  Drug Use No   Comment: Pt denies    Social History   Socioeconomic History  . Marital status: Single    Spouse name: Not on file  . Number of children: Not on file  . Years of education: Not on file  . Highest education level: Not on file  Occupational History  . Not on file  Social Needs  . Financial resource strain: Not on file  . Food insecurity:    Worry: Not on file    Inability: Not on file  . Transportation needs:    Medical: Not on file    Non-medical: Not on file  Tobacco Use  . Smoking status: Current Every Day Smoker    Packs/day: 1.00    Years: 25.00    Pack years: 25.00    Types: Cigarettes  . Smokeless tobacco: Never Used  Substance and Sexual Activity  . Alcohol use: No    Comment: Pt denies   . Drug use: No    Comment: Pt denies  . Sexual activity: Never  Lifestyle  . Physical activity:    Days per week: Not on file    Minutes per session: Not on file  . Stress: Not on file  Relationships  . Social connections:    Talks on phone: Not on file    Gets together: Not on file    Attends religious service: Not on file    Active member of club or organization: Not on file    Attends meetings of clubs or organizations: Not on file    Relationship status: Not on file  Other Topics Concern  . Not on file  Social History Narrative  . Not on file   Additional Social History: He moved to the [place] in 1995. He lives at home with his mother. He denies alcohol or illicit substance use.     Allergies:   Allergies  Allergen Reactions  . Heparin Other (See Comments)    unknown  . Imipramine Other (See Comments)    unknown  . Lamictal [Lamotrigine] Other (See Comments)    unknown  . Penicillins Other (See Comments)    Pt can not remember allergy and is unable to review questions    Labs:  Results for orders placed or performed during the hospital encounter of 10/15/17 (from the past 48 hour(s))  MRSA PCR Screening     Status: None    Collection Time: 10/15/17 11:26 AM  Result Value Ref Range   MRSA by PCR NEGATIVE NEGATIVE    Comment:        The GeneXpert MRSA Assay (FDA approved for NASAL specimens only), is one component of a comprehensive MRSA colonization surveillance program. It is not intended to diagnose MRSA infection nor to guide or monitor treatment for MRSA infections. Performed at [facility], [place] [address], [place], [place] [address]   Acetaminophen level  Status: Abnormal   Collection Time: 10/15/17  1:58 PM  Result Value Ref Range   Acetaminophen (Tylenol), Serum <10 (L) 10 - 30 ug/mL    Comment: (NOTE) Therapeutic concentrations vary significantly. A range of 10-30 ug/mL  may be an effective concentration for many patients. However, some  are best treated at concentrations outside of this range. Acetaminophen concentrations >150 ug/mL at 4 hours after ingestion  and >50 ug/mL at 12 hours after ingestion are often associated with  toxic reactions. Performed at [facility], [place] [address] [place], [place] [address]   Salicylate level     Status: None   Collection Time: 10/15/17  1:58 PM  Result Value Ref Range   Salicylate Lvl <1.4 2.8 - 30.0 mg/dL    Comment: Performed at [facility], [place] [address], [place], [place] [address]  HIV antibody (Routine Testing)     Status: None   Collection Time: 10/15/17  1:58 PM  Result Value Ref Range   HIV Screen 4th Generation wRfx Non Reactive Non Reactive    Comment: (NOTE) Performed At: [facility] [place], [place] [id number] Rush Farmer MD VZ:[id number]   Hemoglobin and hematocrit, blood     Status: Abnormal   Collection Time: 10/15/17  1:58 PM  Result Value Ref Range   Hemoglobin 7.0 (L) 13.0 - 17.0 g/dL   HCT 20.4 (L) 39.0 - 52.0 %    Comment: Performed at [facility], [place] [address] [place], [place] [address]  Prepare RBC      Status: None   Collection Time: 10/15/17  5:55 PM  Result Value Ref Range   Order Confirmation      ORDER PROCESSED BY BLOOD BANK Performed at [facility], [place] [address] [place], [place] [address]   Comprehensive metabolic panel     Status: Abnormal   Collection Time: 10/16/17  3:52 AM  Result Value Ref Range   Sodium 143 135 - 145 mmol/L   Potassium 4.3 3.5 - 5.1 mmol/L   Chloride 115 (H) 98 - 111 mmol/L    Comment: Please note change in reference range.   CO2 24 22 - 32 mmol/L   Glucose, Bld 112 (H) 70 - 99 mg/dL    Comment: Please note change in reference range.   BUN 47 (H) 6 - 20 mg/dL    Comment: Please note change in reference range.   Creatinine, Ser 2.18 (H) 0.61 - 1.24 mg/dL   Calcium 7.9 (L) 8.9 - 10.3 mg/dL   Total Protein 4.4 (L) 6.5 - 8.1 g/dL   Albumin 2.5 (L) 3.5 - 5.0 g/dL   AST 17 15 - 41 U/L   ALT 11 0 - 44 U/L    Comment: Please note change in reference range.   Alkaline Phosphatase 41 38 - 126 U/L   Total Bilirubin 1.7 (H) 0.3 - 1.2 mg/dL   GFR calc non Af Amer 32 (L) >60 mL/min   GFR calc Af Amer 37 (L) >60 mL/min    Comment: (NOTE) The eGFR has been calculated using the CKD EPI equation. This calculation has not been validated in all clinical situations. eGFR's persistently <60 mL/min signify possible Chronic Kidney Disease.    Anion gap 4 (L) 5 - 15    Comment: Performed at [facility], [place] [address] [place], [place] [address]  CBC     Status: Abnormal   Collection Time: 10/16/17  3:52 AM  Result Value Ref Range   WBC 5.4 4.0 - 10.5 K/uL   RBC 2.39 (L) 4.22 - 5.81 MIL/uL   Hemoglobin 7.8 (L) 13.0 - 17.0 g/dL   HCT 22.6 (L) 39.0 - 52.0 %   MCV 94.6 78.0 - 100.0 fL   MCH 32.6 26.0 - 34.0 pg   MCHC 34.5 30.0 - 36.0 g/dL   RDW 16.3 (H) 11.5 - 15.5 %   Platelets 75 (L) 150 - 400 K/uL    Comment: SPECIMEN CHECKED FOR CLOTS REPEATED TO VERIFY PLATELET COUNT CONFIRMED BY SMEAR RESULT CALLED TO, READ BACK  BY AND VERIFIED WITH: H, ROMANS AT 0830 ON 10/16/17 BY A,MOHAMED Performed at [place] [address] [place], [place] [address]   Protime-INR     Status: Abnormal   Collection Time: 10/16/17  3:52 AM  Result Value Ref Range   Prothrombin Time 18.6 (H) 11.4 - 15.2 seconds   INR 1.57     Comment: Performed at [facility], [place] [address] [place], [place] [address]  Comprehensive metabolic panel     Status: Abnormal   Collection Time: 10/17/17  3:47 AM  Result Value Ref Range   Sodium 144 135 - 145 mmol/L   Potassium 3.9 3.5 - 5.1 mmol/L   Chloride 116 (H) 98 - 111 mmol/L    Comment: Please note change in reference range.   CO2 23 22 - 32 mmol/L   Glucose, Bld 134 (H) 70 - 99 mg/dL    Comment: Please note change in reference range.   BUN 32 (H) 6 - 20 mg/dL    Comment: Please note change in reference range.   Creatinine, Ser 1.95 (H) 0.61 - 1.24 mg/dL   Calcium 7.8 (L) 8.9 - 10.3 mg/dL   Total Protein 4.3 (L) 6.5 - 8.1 g/dL   Albumin 2.4 (L) 3.5 - 5.0 g/dL   AST 21 15 - 41 U/L   ALT 10 0 - 44 U/L    Comment: Please note change in reference range.   Alkaline Phosphatase 36 (L) 38 - 126 U/L   Total Bilirubin 0.6 0.3 - 1.2 mg/dL   GFR calc non Af Amer 36 (L) >60 mL/min   GFR calc Af Amer 42 (L) >60 mL/min    Comment: (NOTE) The eGFR has been calculated using the CKD EPI equation. This calculation has not been validated in all clinical situations. eGFR's persistently <60 mL/min signify possible Chronic Kidney Disease.    Anion gap 5 5 - 15    Comment: Performed at [facility], [place] [address], [place], [place] [address]  CBC     Status: Abnormal   Collection Time: 10/17/17  3:47 AM  Result Value Ref Range   WBC 2.1 (L) 4.0 - 10.5 K/uL   RBC 2.10 (L) 4.22 - 5.81 MIL/uL   Hemoglobin 6.9 (LL) 13.0 - 17.0 g/dL    Comment: REPEATED TO VERIFY CRITICAL RESULT CALLED TO, READ BACK BY AND VERIFIED WITH: JEFF,STRENK AT  0435 ON 10/17/17 BY A,MOHAMED    HCT 20.2 (L) 39.0 - 52.0 %   MCV 96.2 78.0 - 100.0 fL   MCH 32.9 26.0 - 34.0 pg   MCHC 34.2 30.0 - 36.0 g/dL   RDW 16.4 (H) 11.5 - 15.5 %   Platelets 57 (L) 150 - 400 K/uL    Comment: SPECIMEN CHECKED FOR CLOTS REPEATED TO VERIFY CONSISTENT WITH PREVIOUS RESULT Performed at [facility], [place]  [address] [place], [place] [address]   Protime-INR     Status: Abnormal   Collection Time: 10/17/17  3:47 AM  Result Value Ref Range   Prothrombin Time 25.6 (H) 11.4 - 15.2 seconds   INR 2.36     Comment: Performed at [facility], [place] [address] [place], [place] [address]  Prepare RBC     Status: None   Collection Time: 10/17/17  5:36 AM  Result Value Ref Range   Order Confirmation      ORDER PROCESSED BY BLOOD BANK Performed at [place] [address] [place], [place] [address]     Current Facility-Administered Medications  Medication Dose Route Frequency Provider Last Rate Last Dose  . 0.9 %  sodium chloride infusion  250 mL Intravenous PRN Agarwala, Einar Grad, MD      . 0.9 %  sodium chloride infusion   Intravenous Continuous Kipp Brood, MD 50 mL/hr at 10/17/17 0919    . albuterol (PROVENTIL) (2.5 MG/3ML) 0.083% nebulizer solution 2.5 mg  2.5 mg Nebulization Q4H PRN Agarwala, Einar Grad, MD      . cinacalcet (SENSIPAR) tablet 30 mg  30 mg Oral Q breakfast Kipp Brood, MD   30 mg at 10/17/17 2956  . ciprofloxacin (CIPRO) tablet 250 mg  250 mg Oral BID Kipp Brood, MD   250 mg at 10/17/17 2130  . fentaNYL (SUBLIMAZE) injection 25 mcg  25 mcg Intravenous Q1H PRN Kipp Brood, MD   25 mcg at 10/17/17 0826  . fluPHENAZine (PROLIXIN) tablet 5 mg  5 mg Oral TID Kipp Brood, MD   5 mg at 10/17/17 0931  . haloperidol lactate (HALDOL) injection 2 mg  2 mg Intravenous Q6H PRN Kipp Brood, MD   2 mg at 10/15/17 1136  . HYDROcodone-acetaminophen (NORCO/VICODIN) 5-325 MG per tablet 1 tablet  1  tablet Oral Q6H PRN Kipp Brood, MD   1 tablet at 10/17/17 0933  . nicotine (NICODERM CQ - dosed in mg/24 hours) patch 14 mg  14 mg Transdermal Daily Agarwala, Ravi, MD      . octreotide (SANDOSTATIN) 500 mcg in sodium chloride 0.9 % 250 mL (2 mcg/mL) infusion  50 mcg/hr Intravenous Continuous Erick Colace, NP      . ondansetron (ZOFRAN-ODT) disintegrating tablet 8 mg  8 mg Oral Q8H PRN Kipp Brood, MD      . pantoprazole (PROTONIX) 80 mg in sodium chloride 0.9 % 250 mL (0.32 mg/mL) infusion  8 mg/hr Intravenous Continuous Erick Colace, NP      . phytonadione (VITAMIN K) tablet 10 mg  10 mg Oral Daily Erick Colace, NP      . trihexyphenidyl (ARTANE) tablet 2 mg  2 mg Oral TID Kipp Brood, MD   2 mg at 10/17/17 0930    Musculoskeletal: Strength & Muscle Tone: within normal limits Gait & Station: UTA since patient is lying in bed. Patient leans: N/A  Psychiatric Specialty Exam: Physical Exam  Nursing note and vitals reviewed. Constitutional: He is oriented to person, place, and time. He appears well-developed and well-nourished.  HENT:  Head: Normocephalic and atraumatic.  Neck: Normal range of motion.  Musculoskeletal: Normal range of motion.  Neurological: He is alert and oriented to person, place, and time.  Skin: No rash noted.  Psychiatric: He has a normal mood and affect. His speech is normal and behavior is normal. Judgment and thought content normal. Cognition and memory are impaired.    Review of Systems  Constitutional: Negative  for chills and fever.  Cardiovascular: Negative for chest pain.  Gastrointestinal: Negative for abdominal pain, constipation, diarrhea, nausea and vomiting.  Psychiatric/Behavioral: Negative for depression, hallucinations, substance abuse and suicidal ideas. The patient does not have insomnia.   All other systems reviewed and are negative.   Blood pressure 119/66, pulse 72, temperature 97.7 F (36.5 C), temperature source  Axillary, resp. rate 20, height '6\' 6"'$  (1.981 m), weight 116.4 kg (256 lb 9.9 oz), SpO2 97 %.Body mass index is 29.65 kg/m.  General Appearance: Fairly Groomed, obese, middle aged, Caucasian male, wearing a hospital gown with a neat beard and lying in bed. NAD.   Eye Contact:  Good  Speech:  Clear and Coherent and Normal Rate  Volume:  Normal  Mood:  Euthymic  Affect:  Appropriate and Congruent  Thought Process:  Goal Directed, Linear and Descriptions of Associations: Intact  Orientation:  Full (Time, Place, and Person)  Thought Content:  Logical  Suicidal Thoughts:  No  Homicidal Thoughts:  No  Memory:  Immediate;   Good Recent;   Good Remote;   Good  Judgement:  Poor  Insight:  Poor  Psychomotor Activity:  Normal  Concentration:  Concentration: Good and Attention Span: Good  Recall:  Good  Fund of Knowledge:  Fair  Language:  Good  Akathisia:  No  Handed:  Right  AIMS (if indicated):   N/A  Assets:  Communication Skills Desire for Improvement Housing  ADL's:  Intact  Cognition: He lacks insight about his medical condition.   Sleep:   Okay   Assessment:  Cosimo Schertzer is a 57 y.o. male who was admitted with hematemesis and upper GI bleeding thought secondary to variceal bleeding. He lacks capacity to leave AMA or refuse treatment for medical condition. He lacks an understanding of his medical condition and does not believe that he is currently bleeding secondary to illogical reasoning. He will need an authorized representative to help him make medical decisions.   Treatment Plan Summary: -Patient lacks capacity to refuse treatment for his medical condition or leave AMA.  -Psychiatry will sign off on patient at this time. Please consult psychiatry again as needed.  Disposition: No evidence of imminent risk to self or others at present.   Patient does not meet criteria for psychiatric inpatient admission.  Faythe Dingwall, DO 10/17/2017 10:45 AM

## 2017-10-17 NOTE — Progress Notes (Addendum)
Critical care progress note  Reason for admission: Hematemesis  History of present illness: This is a 57 year old man who presented to the emergency with a 2-day history of hematemesis and blood per rectum.  He was admitted 7/21. He has a history of liver cirrhosis and takes NSAIDs for arthritis. He has schizoaffective disorder and PTSD following the [place] war.  -refused endoscopy   abx Rocephin 7/20 to 7/21 cipro 7/21>>>  Subjective/interval 7/22: Hgb -> dropped about a gram last night.  Has not had any more hematemesis.  Did get blood last night still having 3-4 melenic stools yesterday but nothing through the night.  He is been hemodynamically stable.  He is reporting quite adamantly that he wants to go home, wants to be discharged  Blood Pressure 119/66   Pulse 72   Temperature 97.7 F (36.5 C) (Axillary) Comment (Src): patient request axillary  Respiration 20   Height 6\' 6"  (1.981 m)   Weight 256 lb 9.9 oz (116.4 kg)   Oxygen Saturation 97%   Body Mass Index 29.65 kg/m    Intake/Output Summary (Last 24 hours) at 10/17/2017 10/19/2017 Last data filed at 10/17/2017 0900 Gross per 24 hour  Intake 2075.66 ml  Output 1977 ml  Net 98.66 ml   Physical Exam   General: Is a 57 year old white male he is in no acute distress however agitated and quite argumentative. HEENT normocephalic atraumatic and edentulous no JVD Pulmonary clear to auscultation  Abdomen: Soft, nontender, positive bowel sounds no organomegaly Cardiac: Regular rate and rhyth Extremities brisk cap refill Neuro: Awake oriented Psych: Uncooperative, confrontational, wants to go home CBC Recent Labs    10/15/17 0622 10/15/17 1358 10/16/17 0352 10/17/17 0347  WBC 13.7*  --  5.4 2.1*  HGB 8.4* 7.0* 7.8* 6.9*  HCT 24.2* 20.4* 22.6* 20.2*  PLT 229  --  75* 57*    Coag's Recent Labs    10/15/17 0649 10/16/17 0352 10/17/17 0347  APTT 32  --   --   INR 2.07 1.57 2.36    BMET Recent Labs   10/15/17 0622 10/16/17 0352 10/17/17 0347  NA 146* 143 144  K 4.6 4.3 3.9  CL 114* 115* 116*  CO2 25 24 23   BUN 46* 47* 32*  CREATININE 2.42* 2.18* 1.95*  GLUCOSE 110* 112* 134*    Electrolytes Recent Labs    10/15/17 0622 10/16/17 0352 10/17/17 0347  CALCIUM 8.1* 7.9* 7.8*    Sepsis Markers No results for input(s): PROCALCITON, O2SATVEN in the last 72 hours.  Invalid input(s): LACTICACIDVEN  ABG No results for input(s): PHART, PCO2ART, PO2ART in the last 72 hours.  Liver Enzymes Recent Labs    10/15/17 0622 10/16/17 0352 10/17/17 0347  AST 21 17 21   ALT 11 11 10   ALKPHOS 47 41 36*  BILITOT 1.5* 1.7* 0.6  ALBUMIN 2.5* 2.5* 2.4*    Cardiac Enzymes No results for input(s): TROPONINI, PROBNP in the last 72 hours.  Glucose No results for input(s): GLUCAP in the last 72 hours.  Imaging No results found.  Impression/plan Acute upper GI bleed History of cirrhosis History of hepatitis C Acute blood loss anemia Coagulopathy Thrombocytopenia Psychiatric disorder: History of schizoaffective disorder as well as posttraumatic stress disorder Chronic kidney disease  Discussion: Challenging situation.  57 year old male patient with a psychiatric history as well as probable language/cultural barriers has a history of cirrhosis which he denies, presents with child's class 11 with hematemesis, upper GI bleed, felt most likely to be variceal bleeding.  He is refused endoscopy thus far.  Refuses to acknowledge that he is actively bleeding because he is no longer vomiting, refuses to acknowledge the hemoglobin drop over the last 24 hours.  States he wants to be discharged.  Refuses to acknowledge that his current bleeding situation could be life-threatening.  I do not think it safe for him to be discharged.  I have asked psychiatry to evaluate him for competency.  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 of 7/22, this totals 5 units since admission on 7/20 -Child score 11 at arrival -Hep C treated in the past medically -Has had paracentesis in the past -LFTs normal Has gynecomastia but other wise no-other significant chronic evidence of liver disease -Has since been weaned off octreotide Plan  will resume PPI gtt and octreotide per GI  He continues to decline EGD however this is been recommended time and time again by gastroenterology Follow-up CBC posttransfusion Needs further monitoring An emergency psychiatry evaluation has been requested for medical competency.  If deemed competent then he will need to leave AGAINST MEDICAL ADVICE as I do not think it safe to discharge him at this point PRN if LFTs No heparin Empiric abx (SBP coverage) day # 3, changed to Cipro 7/21  History of due to affective disorder -Receiving his home medications -Wants to leave AGAINST MEDICAL ADVICE, refusing recommended therapies Plan Have requested emergency psychiatric evaluation for competency  If he attempts to leave before this we will need to involuntarily commit him temporarily  CKD -Creatinine improved some Plan Avoid hypotension Renal dose medications A.m. Chemistry  Thrombocytopenia in setting of liver disease -Platelets currently holding over 50 Plan Follow-up in a.m. CBC if less than 50 we will need to transfuse given hemoglobin drift  Coagulopathy Received FFP and vitamin K on admission INR once again up to 2.36 Plan Daily vitamin K  Diabetes with mild hyper glycemia Plan A.m. glucoses  DVT prophylaxis: scd SUP: PPI Diet: Clear liq Activity: BR  Disposition : ICU  Simonne Martinet ACNP-BC [facility] Pulmonary/Critical Care Pager  # [phone] OR # [phone] if no answer

## 2017-10-18 DIAGNOSIS — Z008 Encounter for other general examination: Secondary | ICD-10-CM

## 2017-10-18 LAB — COMPREHENSIVE METABOLIC PANEL
ALT: 11 U/L (ref 0–44)
ANION GAP: 3 — AB (ref 5–15)
AST: 21 U/L (ref 15–41)
Albumin: 2.4 g/dL — ABNORMAL LOW (ref 3.5–5.0)
Alkaline Phosphatase: 40 U/L (ref 38–126)
BUN: 23 mg/dL — ABNORMAL HIGH (ref 6–20)
CHLORIDE: 116 mmol/L — AB (ref 98–111)
CO2: 23 mmol/L (ref 22–32)
Calcium: 7.4 mg/dL — ABNORMAL LOW (ref 8.9–10.3)
Creatinine, Ser: 1.97 mg/dL — ABNORMAL HIGH (ref 0.61–1.24)
GFR calc Af Amer: 42 mL/min — ABNORMAL LOW (ref >60)
GFR, EST NON AFRICAN AMERICAN: 36 mL/min — AB (ref >60)
Glucose, Bld: 102 mg/dL — ABNORMAL HIGH (ref 70–99)
POTASSIUM: 4.1 mmol/L (ref 3.5–5.1)
SODIUM: 142 mmol/L (ref 135–145)
Total Bilirubin: 0.9 mg/dL (ref 0.3–1.2)
Total Protein: 4.2 g/dL — ABNORMAL LOW (ref 6.5–8.1)

## 2017-10-18 LAB — CBC
HCT: 21.5 % — ABNORMAL LOW (ref 39.0–52.0)
HEMOGLOBIN: 7.2 g/dL — AB (ref 13.0–17.0)
MCH: 32.1 pg (ref 26.0–34.0)
MCHC: 33.5 g/dL (ref 30.0–36.0)
MCV: 96 fL (ref 78.0–100.0)
Platelets: 55 10*3/uL — ABNORMAL LOW (ref 150–400)
RBC: 2.24 MIL/uL — AB (ref 4.22–5.81)
RDW: 17.9 % — ABNORMAL HIGH (ref 11.5–15.5)
WBC: 2.2 10*3/uL — AB (ref 4.0–10.5)

## 2017-10-18 LAB — PROTIME-INR
INR: 1.67
Prothrombin Time: 19.6 seconds — ABNORMAL HIGH (ref 11.4–15.2)

## 2017-10-18 MED ORDER — FUROSEMIDE 20 MG PO TABS: 20.0000 mg | ORAL_TABLET | Freq: Every day | ORAL | Status: DC

## 2017-10-18 MED ORDER — GABAPENTIN 100 MG PO CAPS: 200.0000 mg | ORAL_CAPSULE | Freq: Three times a day (TID) | ORAL | Status: DC

## 2017-10-18 MED ORDER — PANTOPRAZOLE SODIUM 40 MG PO TBEC: 40.0000 mg | DELAYED_RELEASE_TABLET | Freq: Two times a day (BID) | ORAL | 6 refills | Status: DC

## 2017-10-18 MED ORDER — PANTOPRAZOLE SODIUM 40 MG PO TBEC: 40.0000 mg | DELAYED_RELEASE_TABLET | Freq: Every day | ORAL | Status: DC

## 2017-10-18 MED ORDER — PANTOPRAZOLE SODIUM 40 MG PO TBEC: 40.0000 mg | DELAYED_RELEASE_TABLET | Freq: Every day | ORAL | 11 refills | Status: DC

## 2017-10-18 NOTE — Discharge Summary (Signed)
Physician Discharge Summary       Patient ID: [name] MRN: [id number] DOB/AGE: 1960-11-03 57 y.o.  Admit date: 10/15/2017 Discharge date: 10/18/2017  Discharge Diagnoses:   Acute Upper GI Bleed History if Cirrhosis  History of hepatitis C Acute blood Loss Anemia Coagulopathy Thrombocytopenia  Chronic Kidney disease  Psychiatric disorder: schizoaffective disorder, post-traumatic stress disorder  Detailed Hospital Course:  This is a 57 year old male who was admitted on 7/20 to ER w/ 2 day history of hematemesis and dark colored stool. Was admitted to ICU. Treated w/ IV fluids, PPI infusion, octreotide. Consulted by GI medicine who recommended endoscopy but he  Adamantly refused. On 7/22 he was insisting on leaving so we also had psychiatry consult. He was deemed not competent to make medical decision so we asked his mother to meet with [place] the following day on 7/23. On 7/23 his hgb had actually improved. His was feeling better and we felt he was not likely actively bleeding. Dr Kendrick Fries spoke at length with the patient and his family  "I updated his family (mother) through a family friend who was acting as a Nurse, learning disability.  I explained that he had a serious bleeding episode of which we still don't have a secure diagnosis (variceal? Peptic ulcer disease? Portal hypertensive gastropathy?).  I explained that in the absence of a diagnosis his treatment may not be successful and there could be a very serious problem that could recur and be life threatening.  He voiced understanding but still adamantly refused an endoscopy.  His mother tried to convince him to have the endoscopy but he refused. I then called his hepatology clinic and spoke to [facility] who knows him very well and states that she has been frustrated by his refusal to have surveillance endoscopy and follow up ultrasounds over the last two years.  Further, like here today, he refuses to acknowledge that his liver is sick.  Yesterday our  psychiatry team noted that he does not have capacity to make medical decisions because of a lack of understanding of his medical condition and illogical reasoning. Though he refuses the endoscopy we feel is medically necessary, the only way for [place] to accomplish it against his will would involve both chemical and physical restraint.  His risk for complication is high given his multiple comorbid illnesses.  So at this point I believe that the act of performing an endoscopy against his will would cause more harm than good.  We will discharge him home and have him take oral antacids and follow up with his hepatology team.  His mother and friend voiced understanding and are in agreement with the plan." At that point we decided to discharge him to home with the plan as outlined below.     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    History of schizoaffective disorder.  Plan Home on home meds  CKD Plan Follow up out-patient   Thrombocytopenia in setting of liver disease. Plan Follow up out patient    Coagulopathy  -Received FFP and vitamin K Plan Follow up out-patient    Significant Hospital tests/ studies  Consults: Russella Dar w/ GI    Discharge Exam: Blood Pressure (Abnormal) 96/52   Pulse (Abnormal) 57   Temperature (Abnormal) 97.5 F (36.4 C) (Axillary)   Respiration (Abnormal) 29  Height 6\' 6"  (1.981 m)   Weight 260 lb 2.3 oz (118 kg)   Oxygen Saturation 94%   Body Mass Index 30.06 kg/m   General 57 year old wm resting in bed no distress HENT NCAT no JVD pulm clear to auscultation  Card RRR no MRG abd soft not tender + bowel sounds Ext brisk CR no edema warm and dry  Neuro awake and alert no focal def   Labs at discharge Lab Results  Component Value Date   CREATININE 1.97 (H) 10/18/2017   BUN 23 (H)  10/18/2017   NA 142 10/18/2017   K 4.1 10/18/2017   CL 116 (H) 10/18/2017   CO2 23 10/18/2017   Lab Results  Component Value Date   WBC 2.2 (L) 10/18/2017   HGB 7.2 (L) 10/18/2017   HCT 21.5 (L) 10/18/2017   MCV 96.0 10/18/2017   PLT 55 (L) 10/18/2017   Lab Results  Component Value Date   ALT 11 10/18/2017   AST 21 10/18/2017   ALKPHOS 40 10/18/2017   BILITOT 0.9 10/18/2017   Lab Results  Component Value Date   INR 1.67 10/18/2017   INR 2.36 10/17/2017   INR 1.57 10/16/2017    Current radiology studies No results found.  Disposition:  Discharge disposition: 01-Home or Self Care       Discharge Instructions    Diet - low sodium heart healthy   Complete by:  As directed    Increase activity slowly   Complete by:  As directed      Allergies as of 10/18/2017    Allergen Reactions Comment   Heparin Other (See Comments) unknown   Imipramine Other (See Comments) unknown   Lamictal [lamotrigine] Other (See Comments) unknown   Penicillins Other (See Comments) Pt can not remember allergy and is unable to review questions      Medication List    Stop taking these medications   HYDROcodone-acetaminophen 5-325 MG tablet Commonly known as:  NORCO/VICODIN   ibuprofen 600 MG tablet Commonly known as:  ADVIL,MOTRIN   oxyCODONE-acetaminophen 7.5-325 MG tablet Commonly known as:  PERCOCET   traMADol 50 MG tablet Commonly known as:  ULTRAM     Take these medications   diphenhydrAMINE 25 MG tablet Commonly known as:  BENADRYL Take 25 mg by mouth every 6 (six) hours as needed for allergies or sleep.   fluPHENAZine 5 MG tablet Commonly known as:  PROLIXIN Take 5 mg by mouth 3 (three) times daily.   furosemide 20 MG tablet Commonly known as:  LASIX Take 20 mg by mouth daily.   gabapentin 100 MG capsule Commonly known as:  NEURONTIN Take 200 mg by mouth 3 (three) times daily.   ondansetron 8 MG disintegrating tablet Commonly known as:  ZOFRAN ODT Take 1  tablet (8 mg total) by mouth every 8 (eight) hours as needed for nausea or vomiting.   oxyCODONE 5 MG immediate release tablet Commonly known as:  Oxy IR/ROXICODONE Take 1 tablet (5 mg total) by mouth every 6 (six) hours as needed for severe pain.   pantoprazole 40 MG tablet Commonly known as:  PROTONIX Take 1 tablet (40 mg total) by mouth 2 (two) times daily.   SENSIPAR 30 MG tablet Generic drug:  cinacalcet Take 30 mg by mouth daily.   trihexyphenidyl 2 MG tablet Commonly known as:  ARTANE Take 2 mg by mouth 3 (three) times daily.      Follow-up Information    Drazek, Dawn, CRNP Follow  up.   Specialty:  Nurse Practitioner Why:  call and make appointment for follow up of liver problems.   Contact information: [address] [place] [address] [phone]           Discharged Condition: fair  Physician Statement:   The Patient was personally examined, the discharge assessment and plan has been personally reviewed and I agree with ACNP Danniel Tones's assessment and plan. 35 minutes of time have been dedicated to discharge assessment, planning and discharge instructions.   Signed: Shelby Mattocks 10/18/2017, 1:16 PM

## 2017-10-18 NOTE — Progress Notes (Addendum)
    Progress Note   Subjective  Chief Complaint: Hematemesis, Melena, ABLA  This morning, patient's mother and interpreter are present. I saw the patient with Dr. Kendrick Fries with pulmonology/critical care. Patient continues to wish to leave the hospital and declines EGD.      Objective   Vital signs in last 24 hours: Temp:  [97.5 F (36.4 C)-97.8 F (36.6 C)] 97.6 F (36.4 C) (07/23 0800) Pulse Rate:  [57-71] 57 (07/23 0600) Resp:  [11-29] 29 (07/23 0500) BP: (82-113)/(23-59) 96/52 (07/23 0600) SpO2:  [94 %-99 %] 94 % (07/23 0600) Weight:  [260 lb 2.3 oz (118 kg)] 260 lb 2.3 oz (118 kg) (07/23 0500) Last BM Date: 10/18/17 General:    white male in NAD Psych: Normal mood and affect.  Intake/Output from previous day: 07/22 0701 - 07/23 0700 In: 1514.8 [P.O.:240; I.V.:883.3; Blood:391.5] Out: 1275 [Urine:1275] Intake/Output this shift: Total I/O In: 440 [P.O.:440] Out: 425 [Urine:425]  Lab Results: Recent Labs    10/16/17 0352 10/17/17 0347 10/18/17 0346  WBC 5.4 2.1* 2.2*  HGB 7.8* 6.9* 7.2*  HCT 22.6* 20.2* 21.5*  PLT 75* 57* 55*   BMET Recent Labs    10/16/17 0352 10/17/17 0347 10/18/17 0346  NA 143 144 142  K 4.3 3.9 4.1  CL 115* 116* 116*  CO2 24 23 23   GLUCOSE 112* 134* 102*  BUN 47* 32* 23*  CREATININE 2.18* 1.95* 1.97*  CALCIUM 7.9* 7.8* 7.4*   LFT Recent Labs    10/18/17 0346  PROT 4.2*  ALBUMIN 2.4*  AST 21  ALT 11  ALKPHOS 40  BILITOT 0.9   PT/INR Recent Labs    10/17/17 0347 10/18/17 0346  LABPROT 25.6* 19.6*  INR 2.36 1.67     Assessment / Plan:   Assessment: 1. Hematemesis with melena: last melena 10/16/17, hgb now remaining stable overnight; likely from varices vs gastritis vs gastropathy vs ulcer vs other - pt declining EGD 2. Cirrhosis: minor ascites on CT 3. ABLA: hgb stable after 5 u prbcs 6.9-->1 unit-->7.2 overnight 4. Coagulopathy: s/p 2 u FFP, vit K 5. CKD  Plan: 1. Patient continues to decline EGD this morning  even after further discussion with him, his mother and the critical care team. Hgb remains somewhat stable overnight with no further signs of acute GI bleeding. 2. Recommend patient follow with 10/18/17, NP at [facility] [phone] as outpatient. She can help arrange further treatment for his cirrhosis and eventual variceal surveillance in the future 3.  Continue patient on Pantoprazole 40mg  BID as outpatient 4.  Consider starting non-selective beta blocker such as Propranolol given CT findings with portal hypertension, if there are no contradictions - defer to [facility], NP. 5. We will sign off.  Thank you for your kind consultation. Please let know if we may be of any further assistance.    LOS: 3 days   [facility]  10/18/2017, 11:10 AM  Pager # [phone]    Attending physician's note   I have taken an interval history, reviewed the chart and examined the patient. I fully agree with the Advanced Practitioner's note, impression and recommendations as outlined above. Case discussed with Dr. 10/20/2017.   [phone], MD FACG [phone] office

## 2017-10-18 NOTE — Progress Notes (Addendum)
PULMONARY / CRITICAL CARE MEDICINE   Name: [name] MRN: [id number] DOB: [birth year]    ADMISSION DATE:  10/15/2017 CONSULTATION DATE:  10/18/2017   CHIEF COMPLAINT:  hemoptysis  HISTORY OF PRESENT ILLNESS:   57 y/o male with known Hepatitis C cirrhosis here with hematemesis and melanotic stools.  He presented on 7/21 with three days of hematemesis.    SUBJECTIVE:  No bleeding overnight Not passing blood per rectum  VITAL SIGNS: BP (!) 96/52   Pulse (!) 57   Temp 97.6 F (36.4 C) (Axillary)   Resp (!) 29   Ht 6\' 6"  (1.981 m)   Wt 260 lb 2.3 oz (118 kg)   SpO2 94%   BMI 30.06 kg/m   HEMODYNAMICS:    VENTILATOR SETTINGS:    INTAKE / OUTPUT: I/O last 3 completed shifts: In: 1754.8 [P.O.:480; I.V.:883.3; Blood:391.5] Out: 2250 [Urine:2250]  PHYSICAL EXAMINATION:   General:  Resting comfortably in bed HENT: NCAT OP clear PULM: CTA B, normal effort CV: RRR, no mgr GI: BS+, soft, nontender MSK: normal bulk and tone Neuro: awake, alert, no distress, MAEW   LABS:  BMET Recent Labs  Lab 10/16/17 0352 10/17/17 0347 10/18/17 0346  NA 143 144 142  K 4.3 3.9 4.1  CL 115* 116* 116*  CO2 24 23 23   BUN 47* 32* 23*  CREATININE 2.18* 1.95* 1.97*  GLUCOSE 112* 134* 102*    Electrolytes Recent Labs  Lab 10/16/17 0352 10/17/17 0347 10/18/17 0346  CALCIUM 7.9* 7.8* 7.4*    CBC Recent Labs  Lab 10/16/17 0352 10/17/17 0347 10/18/17 0346  WBC 5.4 2.1* 2.2*  HGB 7.8* 6.9* 7.2*  HCT 22.6* 20.2* 21.5*  PLT 75* 57* 55*    Coag's Recent Labs  Lab 10/15/17 0649 10/16/17 0352 10/17/17 0347 10/18/17 0346  APTT 32  --   --   --   INR 2.07 1.57 2.36 1.67    Sepsis Markers No results for input(s): LATICACIDVEN, PROCALCITON, O2SATVEN in the last 168 hours.  ABG No results for input(s): PHART, PCO2ART, PO2ART in the last 168 hours.  Liver Enzymes Recent Labs  Lab 10/16/17 0352 10/17/17 0347 10/18/17 0346  AST 17 21 21   ALT 11 10 11   ALKPHOS  41 36* 40  BILITOT 1.7* 0.6 0.9  ALBUMIN 2.5* 2.4* 2.4*    Cardiac Enzymes No results for input(s): TROPONINI, PROBNP in the last 168 hours.  Glucose No results for input(s): GLUCAP in the last 168 hours.  Imaging No results found.   STUDIES:    CULTURES:   Antibiotics: Rocephin 7/20 to 7/21 cipro 7/21>>>   SIGNIFICANT EVENTS:   LINES/TUBES:   DISCUSSION: 57 y/o male with hepatitis C cirrhosis who presented with a GI bleed.  Hgb has remained stable overnight.   ASSESSMENT / PLAN:  PULMONARY A: No acute issues P:   Monitor respiratory status  CARDIOVASCULAR A:  No acute issues P:  Tele Monitor hemodynamics  RENAL A:   CKD, stage 3 P:   Monitor BMET and UOP Replace electrolytes as needed  GASTROINTESTINAL A:   Hepatitis C cirrhosis > patient Upper GI bleeding> no evidence of that now, but source remains uncertain.  See discussion below. Ascites P:   Monitor for bleeding Continue PPI, consider stopping octreotide F/u with GI> need for endoscopy?   HEMATOLOGIC A:   Anemia, now no evidence of bleeding P:  Monitor for bleeding Transfuse PRBC for Hgb < 7 gm/dL  INFECTIOUS A:   SBP prophylaxis P:  Continue cipro  ENDOCRINE A:   No acute issues P:   Monitor glucose  NEUROLOGIC/PSYCHIATRIC A:   Bipolar disorder Does not have capacity for decision making P:   Continue cinacalcet, fluphenazine, artane    FAMILY  - Updates:  I updated his family (mother) through a family friend who was acting as a Nurse, learning disability.  I explained that he had a serious bleeding episode of which we still don't have a secure diagnosis (variceal? Peptic ulcer disease? Portal hypertensive gastropathy?).  I explained that in the absence of a diagnosis his treatment may not be successful and there could be a very serious problem that could recur and be life threatening.  He voiced understanding but still adamantly refused an endoscopy.  His mother tried to convince  him to have the endoscopy but he refused. I then called his hepatology clinic and spoke to [facility] who knows him very well and states that she has been frustrated by his refusal to have surveillance endoscopy and follow up ultrasounds over the last two years.  Further, like here today, he refuses to acknowledge that his liver is sick.  Yesterday our psychiatry team noted that he does not have capacity to make medical decisions because of a lack of understanding of his medical condition and illogical reasoning. Though he refuses the endoscopy we feel is medically necessary, the only way for [place] to accomplish it against his will would involve both chemical and physical restraint.  His risk for complication is high given his multiple comorbid illnesses.  So at this point I believe that the act of performing an endoscopy against his will would cause more harm than good.  We will discharge him home and have him take oral antacids and follow up with his hepatology team.  His mother and friend voiced understanding and are in agreement with the plan.   Total critical care time 75 minutes  Heber Sutton, MD [place] [facility] Pager: [phone] Cell: [phone] After 3pm or if no response, call [phone]   10/18/2017, 10:28 AM

## 2017-10-18 NOTE — Discharge Instructions (Signed)
Abdominal Pain, Adult Abdominal pain can be caused by many things. Often, abdominal pain is not serious and it gets better with no treatment or by being treated at home. However, sometimes abdominal pain is serious. Your health care provider will do a medical history and a physical exam to try to determine the cause of your abdominal pain. Follow these instructions at home:  Take over-the-counter and prescription medicines only as told by your health care provider. Do not take a laxative unless told by your health care provider.  Drink enough fluid to keep your urine clear or pale yellow.  Watch your condition for any changes.  Keep all follow-up visits as told by your health care provider. This is important. Contact a health care provider if:  Your abdominal pain changes or gets worse.  You are not hungry or you lose weight without trying.  You are constipated or have diarrhea for more than 2-3 days.  You have pain when you urinate or have a bowel movement.  Your abdominal pain wakes you up at night.  Your pain gets worse with meals, after eating, or with certain foods.  You are throwing up and cannot keep anything down.  You have a fever. Get help right away if:  Your pain does not go away as soon as your health care provider told you to expect.  You cannot stop throwing up.  Your pain is only in areas of the abdomen, such as the right side or the left lower portion of the abdomen.  You have bloody or black stools, or stools that look like tar.  You have severe pain, cramping, or bloating in your abdomen.  You have signs of dehydration, such as: ? Dark urine, very little urine, or no urine. ? Cracked lips. ? Dry mouth. ? Sunken eyes. ? Sleepiness. ? Weakness. This information is not intended to replace advice given to you by your health care provider. Make sure you discuss any questions you have with your health care provider. Document Released: 12/23/2004  Document Revised: 10/03/2015 Document Reviewed: 08/27/2015 Elsevier Interactive Patient Education  2018 [employer] DISCHARGE INSTRUCTIONS TO PATIENT  Medications and dosages:Begin taking medication - Pantoprazole tonight and twice daily  REMINDER:   Carry a list of your medications and allergies with you at all times  Call your pharmacy at least 1 week in advance to refill prescriptions  Do not mix any prescribed pain medicine with alcohol  Do not drive any motor vehicles while taking pain medication.  Take medications with food.  Do not retake a pain medication if you vomit after taking it.  Activity: plan; increase activity slowly.   Follow-up appointments (date to return to physician): Call for follow-up appointment with Annamarie Major, CRNP in two weeks  Call Surgeon if you have:  Temperature greater than 100.4  Persistent nausea and vomiting  Severe uncontrolled pain  Redness, tenderness, or signs of infection (pain, swelling, redness, odor or green/yellow discharge around the site)  Difficulty breathing, headache or visual disturbances  Hives  Persistent dizziness or light-headedness  Extreme fatigue  Any other questions or concerns you may have after discharge  In an emergency, call 911 or go to an Emergency Department at a nearby hospital   Renal Patients:  Return to Dialysis on, be sure to take your paperwork (graft diagram) for the nurses.  Diet:   Begin with liquids, and if they are tolerated, resume your usual diet.  Avoid spicy, greasy or heavy foods.  If you have nausea or vomiting, go back to liquids.  If you cannot keep liquids down, call your doctor.  Avoid alcohol consumption while on prescription pain medications. Good nutrition promotes healing. Increase fiber and fluids.   Supplies & Equipment:N/A Education Materials Received: :Yes Belongings Returned: Yes Home medications Returned:N/A  SPECIAL INSTRUCTIONS: Gi call if  instructions:[id number]::"New or increased pain.","New or increased bleeding.","Nausea & vomitting.","Fever & chills.","Shortness of breath.","Chest pain.","Abdominal disten2tion."  I understand and acknowledge receipt of the above instructions.                                                                                                                                        Patient or Guardian Signature                                                                    Date/Time                                                                                                                                        Physician's or R.N.'s Signature                                                                  Date/Time  The discharge instructions have been reviewed with the patient and/or Family Member/Parent/Guardian.  Patient and/or Family Member/Parent/Guardian signed and retained a printed copy. Gastrointestinal Bleeding Gastrointestinal bleeding is bleeding somewhere along the path food travels through the body (digestive tract). This path is anywhere between the mouth and the opening of the butt (anus). You may have blood in your poop (stools) or have black poop. If you throw up (vomit), there may be blood in it. This condition can be mild, serious, or even life-threatening. If you have a lot of bleeding, you may need to stay in the hospital. Follow these  instructions at home:  Take over-the-counter and prescription medicines only as told by your doctor.  Eat foods that have a lot of fiber in them. These foods include whole grains, fruits, and vegetables. You can also try eating 1-3 prunes each day.  Drink enough fluid to keep your pee (urine) clear or pale yellow.  Keep all follow-up visits as told by your doctor. This is important. Contact a doctor if:  Your symptoms do not get better. Get help right away if:  Your bleeding gets worse.  You feel dizzy or you pass out  (faint).  You feel weak.  You have very bad cramps in your back or belly (abdomen).  You pass large clumps of blood (clots) in your poop.  Your symptoms are getting worse. This information is not intended to replace advice given to you by your health care provider. Make sure you discuss any questions you have with your health care provider. Document Released: 12/23/2007 Document Revised: 08/21/2015 Document Reviewed: 09/02/2014 Elsevier Interactive Patient Education  2018 [employer].

## 2017-10-19 LAB — TYPE AND SCREEN
ABO/RH(D): A POS
ANTIBODY SCREEN: NEGATIVE
UNIT DIVISION: 0
UNIT DIVISION: 0
UNIT DIVISION: 0
Unit division: 0
Unit division: 0

## 2017-10-19 LAB — BPAM RBC
BLOOD PRODUCT EXPIRATION DATE: 201908072359
BLOOD PRODUCT EXPIRATION DATE: 201908092359
Blood Product Expiration Date: 201908072359
Blood Product Expiration Date: 201908092359
Blood Product Expiration Date: 201908092359
ISSUE DATE / TIME: 201907200820
ISSUE DATE / TIME: 201907201850
ISSUE DATE / TIME: 201907202238
ISSUE DATE / TIME: 201907220615
UNIT TYPE AND RH: 6200
UNIT TYPE AND RH: 6200
UNIT TYPE AND RH: 6200
Unit Type and Rh: 6200
Unit Type and Rh: 6200

## 2017-11-01 ENCOUNTER — Encounter: Payer: Self-pay | Admitting: Diagnostic Neuroimaging

## 2017-11-02 ENCOUNTER — Encounter: Payer: Self-pay | Admitting: Diagnostic Neuroimaging

## 2017-11-02 ENCOUNTER — Ambulatory Visit: Payer: Medicaid Other | Admitting: Diagnostic Neuroimaging

## 2017-11-02 VITALS — BP 98/61 | HR 75 | Ht 78.0 in | Wt 274.0 lb

## 2017-11-02 DIAGNOSIS — M79601 Pain in right arm: Secondary | ICD-10-CM | POA: Diagnosis not present

## 2017-11-02 DIAGNOSIS — M79605 Pain in left leg: Secondary | ICD-10-CM | POA: Diagnosis not present

## 2017-11-02 DIAGNOSIS — R269 Unspecified abnormalities of gait and mobility: Secondary | ICD-10-CM

## 2017-11-02 DIAGNOSIS — M79604 Pain in right leg: Secondary | ICD-10-CM | POA: Diagnosis not present

## 2017-11-02 DIAGNOSIS — M79602 Pain in left arm: Secondary | ICD-10-CM

## 2017-11-02 NOTE — Progress Notes (Signed)
[facility]  PATIENT: [name] DOB: [birth year]  REFERRING CLINICIAN: Graylon Gunning, MD HISTORY FROM: patient  REASON FOR VISIT: new consult    HISTORICAL  CHIEF COMPLAINT:  Chief Complaint  Patient presents with  . Neuropathy    rm 7, New Pt, mom- Safija, "Pain in legs and arms for 2 months"    HISTORY OF PRESENT ILLNESS:   57 year old male with history of hepatitis C, chronic kidney disease, PTSD, here for evaluation of upper and lower extremity pain.  For past 6 months patient has had numbness in his feet.  This spontaneously improved 1 to 2 months ago.  He has also been having 5 months of severe pain in his muscles, joints, affecting arms and legs.  Patient now on oxycodone per PCP which is helping.  Patient also taking gabapentin which is helping.  Patient has history of right hip fracture requiring multiple surgeries approximately 20 years ago.  Patient had severe hospitalization for pneumonia 5 to 10 years ago, following which patient was in a wheelchair for 5 years.  Patient has been able to improve his mobility and now walks with a single-point cane.  Patient was recently hospitalized for acute GI bleeding.   REVIEW OF SYSTEMS: Full 14 system review of systems performed and negative with exception of: Numbness and pain.  ALLERGIES: Allergies  Allergen Reactions  . Heparin Other (See Comments)    unknown  . Imipramine Other (See Comments)    unknown  . Lamictal [Lamotrigine] Other (See Comments)    unknown  . Penicillins Other (See Comments)    Pt can not remember allergy and is unable to review questions    HOME MEDICATIONS: Outpatient Medications Prior to Visit  Medication Sig Dispense Refill  . [facility] D3 2000 units CAPS Take 2,000 Units by mouth daily.  5  . diphenhydrAMINE (BENADRYL) 25 MG tablet Take 25 mg by mouth every 6 (six) hours as needed for allergies or sleep.    . fluPHENAZine (PROLIXIN) 5 MG tablet Take 5 mg by mouth 3 (three)  times daily.  2  . furosemide (LASIX) 20 MG tablet Take 20 mg by mouth daily.     Marland Kitchen gabapentin (NEURONTIN) 300 MG capsule 300 mg.  2  . oxyCODONE (OXY IR/ROXICODONE) 5 MG immediate release tablet Take 1 tablet (5 mg total) by mouth every 6 (six) hours as needed for severe pain. 12 tablet 0  . pantoprazole (PROTONIX) 40 MG tablet Take 1 tablet (40 mg total) by mouth 2 (two) times daily. 60 tablet 6  . SENSIPAR 30 MG tablet Take 30 mg by mouth daily.  11  . trihexyphenidyl (ARTANE) 2 MG tablet Take 2 mg by mouth 3 (three) times daily.  2  . gabapentin (NEURONTIN) 100 MG capsule Take 200 mg by mouth 3 (three) times daily.    . ondansetron (ZOFRAN ODT) 8 MG disintegrating tablet Take 1 tablet (8 mg total) by mouth every 8 (eight) hours as needed for nausea or vomiting. (Patient not taking: Reported on 11/02/2017) 10 tablet 0   No facility-administered medications prior to visit.     PAST MEDICAL HISTORY: Past Medical History:  Diagnosis Date  . Anemia   . Ascites   . Chronic leg pain   . Cirrhosis ([place])   . GERD (gastroesophageal reflux disease)   . H/O diabetes insipidus   . Hepatitis C    Completed therapy with Ep[facility] ~ 2018.  suspect therapy overseen by [facility] in [place], [place]  [place]  . History of adrenal insufficiency   . History of ARDS   . History of blood transfusion   . Hypothyroidism   . Kidney disease    stage 3  . Neuropathy   . Overdose 12/2009  . Peripheral neuropathy   . Personality disorder ([place])   . PTSD (post-traumatic stress disorder)    SECONDARY TO WAR IN [place]  . Schizoaffective disorder ([place])     PAST SURGICAL HISTORY: Past Surgical History:  Procedure Laterality Date  . CERVICAL SPINE SURGERY  unknown  . RIGHT HIP SURGERY  1996  . US GUIDED LEFT THORACENTESIS  01/21/2010    FAMILY HISTORY: Family History  Problem Relation Age of Onset  . Heart attack Father     SOCIAL HISTORY: Social History   Socioeconomic History  . Marital  status: Single    Spouse name: Not on file  . Number of children: 0  . Years of education: 34  . Highest education level: Not on file  Occupational History    Comment: umemployed  Social Needs  . Financial resource strain: Not on file  . Food insecurity:    Worry: Not on file    Inability: Not on file  . Transportation needs:    Medical: Not on file    Non-medical: Not on file  Tobacco Use  . Smoking status: Current Every Day Smoker    Packs/day: 1.00    Years: 25.00    Pack years: 25.00    Types: Cigarettes  . Smokeless tobacco: Never Used  Substance and Sexual Activity  . Alcohol use: No    Comment: Pt denies   . Drug use: No    Comment: Pt denies  . Sexual activity: Never  Lifestyle  . Physical activity:    Days per week: Not on file    Minutes per session: Not on file  . Stress: Not on file  Relationships  . Social connections:    Talks on phone: Not on file    Gets together: Not on file    Attends religious service: Not on file    Active member of club or organization: Not on file    Attends meetings of clubs or organizations: Not on file    Relationship status: Not on file  . Intimate partner violence:    Fear of current or ex partner: Not on file    Emotionally abused: Not on file    Physically abused: Not on file    Forced sexual activity: Not on file  Other Topics Concern  . Not on file  Social History Narrative   11/02/17 Lives with mother     PHYSICAL EXAM  GENERAL EXAM/CONSTITUTIONAL: Vitals:  Vitals:   11/02/17 1103  BP: 98/61  Pulse: 75  Weight: 274 lb (124.3 kg)  Height: 6' 6" (1.981 m)     Body mass index is 31.66 kg/m. Wt Readings from Last 3 Encounters:  11/02/17 274 lb (124.3 kg)  10/18/17 260 lb 2.3 oz (118 kg)  09/20/17 250 lb (113.4 kg)     Patient is in no distress; well developed, nourished and groomed; neck is supple  EDENTULOUS  CARDIOVASCULAR:  Examination of carotid arteries is normal; no carotid  bruits  Regular rate and rhythm, no murmurs  Examination of peripheral vascular system by observation and palpation is normal  EYES:  Ophthalmoscopic exam of optic discs and posterior segments is normal; no papilledema or hemorrhages  No exam data present  MUSCULOSKELETAL:  Gait,  strength, tone, movements noted in Neurologic exam below  NEUROLOGIC: MENTAL STATUS:  No flowsheet data found.  awake, alert, oriented to person, place and time  recent and remote memory intact  normal attention and concentration  language fluent, comprehension intact, naming intact  fund of knowledge appropriate  CRANIAL NERVE:   2nd - no papilledema on fundoscopic exam  2nd, 3rd, 4th, 6th - pupils equal and reactive to light, visual fields full to confrontation, extraocular muscles intact, no nystagmus  5th - facial sensation symmetric  7th - facial strength symmetric  8th - hearing intact  9th - palate elevates symmetrically, uvula midline  11th - shoulder shrug symmetric  12th - tongue protrusion midline  MOTOR:   normal bulk and tone, full strength in the BUE  BLE --> HIP FLEX 2-3 RIGHT, 3 LEFT; KE/KF 5; DF 4 RIGHT, 5 LEFT  SENSORY:   normal and symmetric to light touch; DECR VIB AT TOES  COORDINATION:   finger-nose-finger, fine finger movements normal  REFLEXES:   deep tendon reflexes TRACE and symmetric; ABSENT IN ANKLES  GAIT/STATION:   UNSTEADY GAIT; USES SINGLE POINT CANE; LIMPING ON RIGHT LEG; GETS SHORT OF BREATH WITH WALKING 10 FEET     DIAGNOSTIC DATA (LABS, IMAGING, TESTING) - I reviewed patient records, labs, notes, testing and imaging myself where available.  Lab Results  Component Value Date   WBC 2.2 (L) 10/18/2017   HGB 7.2 (L) 10/18/2017   HCT 21.5 (L) 10/18/2017   MCV 96.0 10/18/2017   PLT 55 (L) 10/18/2017      Component Value Date/Time   NA 142 10/18/2017 0346   K 4.1 10/18/2017 0346   CL 116 (H) 10/18/2017 0346   CO2 23  10/18/2017 0346   GLUCOSE 102 (H) 10/18/2017 0346   BUN 23 (H) 10/18/2017 0346   CREATININE 1.97 (H) 10/18/2017 0346   CALCIUM 7.4 (L) 10/18/2017 0346   CALCIUM 8.5 09/30/2010 0437   PROT 4.2 (L) 10/18/2017 0346   ALBUMIN 2.4 (L) 10/18/2017 0346   AST 21 10/18/2017 0346   ALT 11 10/18/2017 0346   ALKPHOS 40 10/18/2017 0346   BILITOT 0.9 10/18/2017 0346   GFRNONAA 36 (L) 10/18/2017 0346   GFRAA 42 (L) 10/18/2017 0346   No results found for: CHOL, HDL, LDLCALC, LDLDIRECT, TRIG, CHOLHDL Lab Results  Component Value Date   HGBA1C  02/05/2010    5.4 (NOTE)                                                                       According to the ADA Clinical Practice Recommendations for 2011, when HbA1c is used as a screening test:   >=6.5%   Diagnostic of Diabetes Mellitus           (if abnormal result  is confirmed)  5.7-6.4%   Increased risk of developing Diabetes Mellitus  References:Diagnosis and Classification of Diabetes Mellitus,Diabetes ZCHY,8502,77(AJOIN 1):S62-S69 and Standards of Medical Care in         Diabetes - 2011,Diabetes OMVE,7209,47  (Suppl 1):S11-S61.   Lab Results  Component Value Date   [id number] 476 01/14/2010   Lab Results  Component Value Date   TSH 0.787 10/27/2010     10/27/10 CT head [I reviewed  images myself and agree with interpretation. -VRP]  - Cerebral atrophy without acute intracranial abnormality. - Sinusitis with air-fluid level on the left.    ASSESSMENT AND PLAN  57 y.o. year old male here with:  Dx:  1. Pain in both lower extremities   2. Pain in both upper extremities   3. Gait difficulty     PLAN:  CHRONIC PAIN / WEAKNESS / NUMBNESS (myopathy vs neuropathy vs polyradiculopathy) - continue pain mgmt per PCP (oxycodone + gabapentin) - TAPM - check labs - will setup PT evaluation - follow up with orthopedic and PCP clinics  Orders Placed This Encounter  Procedures  . Vitamin B12  . Hemoglobin A1c  . TSH  . Multiple Myeloma  Panel (SPEP&IFE w/QIG)  . ANA,IFA RA Diag Pnl w/rflx Tit/Patn  . CK  . Aldolase  . Acetylcholine Receptor, Binding  . Ambulatory referral to Physical Therapy   Return pending test results.    Penni Bombard, MD 05/08/7987, 21:19 AM Certified in Neurology, Neurophysiology and Neuroimaging  [facility] [address] [place], [place] [address] [phone]

## 2017-11-05 LAB — HEMOGLOBIN A1C
ESTIMATED AVERAGE GLUCOSE: 80 mg/dL
HEMOGLOBIN A1C: 4.4 % — AB (ref 4.8–5.6)

## 2017-11-05 LAB — MULTIPLE MYELOMA PANEL, SERUM
ALPHA2 GLOB SERPL ELPH-MCNC: 0.5 g/dL (ref 0.4–1.0)
Albumin SerPl Elph-Mcnc: 2.9 g/dL (ref 2.9–4.4)
Albumin/Glob SerPl: 1.2 (ref 0.7–1.7)
Alpha 1: 0.2 g/dL (ref 0.0–0.4)
B-Globulin SerPl Elph-Mcnc: 0.6 g/dL — ABNORMAL LOW (ref 0.7–1.3)
Gamma Glob SerPl Elph-Mcnc: 1.2 g/dL (ref 0.4–1.8)
Globulin, Total: 2.5 g/dL (ref 2.2–3.9)
IGA/IMMUNOGLOBULIN A, SERUM: 120 mg/dL (ref 90–386)
IGM (IMMUNOGLOBULIN M), SRM: 89 mg/dL (ref 20–172)
IgG (Immunoglobin G), Serum: 1140 mg/dL (ref 700–1600)
TOTAL PROTEIN: 5.4 g/dL — AB (ref 6.0–8.5)

## 2017-11-05 LAB — ANA,IFA RA DIAG PNL W/RFLX TIT/PATN
ANA TITER 1: NEGATIVE
CYCLIC CITRULLIN PEPTIDE AB: 20 U — AB (ref 0–19)

## 2017-11-05 LAB — TSH: TSH: 2.43 u[IU]/mL (ref 0.450–4.500)

## 2017-11-05 LAB — CK: CK TOTAL: 66 U/L (ref 24–204)

## 2017-11-05 LAB — ALDOLASE: ALDOLASE: 4.1 U/L (ref 3.3–10.3)

## 2017-11-05 LAB — ACETYLCHOLINE RECEPTOR, BINDING: AChR Binding Ab, Serum: 0.17 nmol/L (ref 0.00–0.24)

## 2017-11-05 LAB — VITAMIN B12: Vitamin B-12: 1396 pg/mL — ABNORMAL HIGH (ref 232–1245)

## 2017-11-07 ENCOUNTER — Telehealth: Payer: Self-pay | Admitting: *Deleted

## 2017-11-07 NOTE — Telephone Encounter (Signed)
LVM requesting call back re: lab results.  

## 2017-11-09 NOTE — Telephone Encounter (Signed)
Called patient to inform of lab results. He stated he doesn't speak English well and asked this RN to call back. Called back and LVM on number listed on DPR. Advised his labs are unremarkable. Dr Merceda Elks recommends he continue to FU with PCP, pain management, orthopedic clinic and do PT therapy that was ordered. Left number for any questions.

## 2017-11-14 ENCOUNTER — Ambulatory Visit: Payer: Medicaid Other | Admitting: Physical Therapy

## 2017-12-08 ENCOUNTER — Ambulatory Visit: Payer: Medicaid Other | Admitting: Physical Therapy

## 2017-12-12 ENCOUNTER — Other Ambulatory Visit: Payer: Self-pay

## 2017-12-12 ENCOUNTER — Encounter: Payer: Self-pay | Admitting: Physical Therapy

## 2017-12-12 ENCOUNTER — Ambulatory Visit: Payer: Medicaid Other | Attending: Internal Medicine | Admitting: Physical Therapy

## 2017-12-12 DIAGNOSIS — R293 Abnormal posture: Secondary | ICD-10-CM | POA: Insufficient documentation

## 2017-12-12 DIAGNOSIS — M6281 Muscle weakness (generalized): Secondary | ICD-10-CM

## 2017-12-12 DIAGNOSIS — M79604 Pain in right leg: Secondary | ICD-10-CM | POA: Diagnosis present

## 2017-12-12 DIAGNOSIS — R2689 Other abnormalities of gait and mobility: Secondary | ICD-10-CM | POA: Diagnosis not present

## 2017-12-12 DIAGNOSIS — M79605 Pain in left leg: Secondary | ICD-10-CM | POA: Diagnosis present

## 2017-12-12 DIAGNOSIS — R296 Repeated falls: Secondary | ICD-10-CM

## 2017-12-12 NOTE — Therapy (Signed)
[facility] [address] [place], [place], [address] Phone: [phone]   Fax:  [phone]  Physical Therapy Evaluation  Patient Details  Name: [name] MRN: [id number] Date of Birth: 01-16-1961 Referring Provider: Joycelyn Schmid, MD   Encounter Date: 12/12/2017  PT End of Session - 12/12/17 1607    Visit Number  1    Number of Visits  3    Date for PT Re-Evaluation  01/11/18    Authorization Type  Medicaid [place] Access     Authorization Time Period  Awaiting Medicaid approval of visits     Authorization - Visit Number  --    PT Start Time  1445    PT Stop Time  1535    PT Time Calculation (min)  50 min    Equipment Utilized During Treatment  Gait belt;Oxygen    Activity Tolerance  Patient tolerated treatment well    Behavior During Therapy  WFL for tasks assessed/performed       Past Medical History:  Diagnosis Date  . Anemia   . Ascites   . Chronic leg pain   . Cirrhosis (HCC)   . GERD (gastroesophageal reflux disease)   . H/O diabetes insipidus   . Hepatitis C    Completed therapy with [facility] ~ 2018.  suspect therapy overseen by [facility] in [place], [place]  . History of adrenal insufficiency   . History of ARDS   . History of blood transfusion   . Hypothyroidism   . Kidney disease    stage 3  . Neuropathy   . Overdose 12/2009  . Peripheral neuropathy   . Personality disorder (HCC)   . PTSD (post-traumatic stress disorder)    SECONDARY TO WAR IN [place]  . Schizoaffective disorder [facility])     Past Surgical History:  Procedure Laterality Date  . CERVICAL SPINE SURGERY  unknown  . RIGHT HIP SURGERY  1996  . US GUIDED LEFT THORACENTESIS  01/21/2010    Vitals:   12/12/17 1451  SpO2: 99%     Subjective Assessment - 12/12/17 1451    Subjective  Pt reports on July 26th he started bleeding from mouth and was sent to the ER for 3 days. At the ER, Pt reports they gave him  plasma and s/p the plasma transfusion he felt increasingly weak all over. Aronud April- May he felt pain in entire body and was dx with arthritis and doctors recommended he visit a pain clinic for medication. Pt reports pain medication helps his UE arthritis pain & LE nerve pain but does not abolish symptoms. Pt reports experiencing x1 fall episode 3 weeks prior and had difficulty recovering from fall because his LE's are weak. Pt reports seeing a neurologist in which he had diagnostic work ups with doctors unable to identify origin of symptoms.      Patient is accompained by:  Family member    Pertinent History  Anemia, ascites, chronic leg pain, cirrhosis, GERD, h/o diabetes, hepatitis C, adrenal insufficiency, ARDS, PNA, blood transfusions, hypothyroidism, kidney disease, neuropathy, overdose, peripheral neuropathy, personality disorder, PTSD , schizoaffective disorder, cervical spine surgery and right hip surgery    Limitations  Standing;Walking;Lifting    Patient Stated Goals  Get stronger and ambulate further distances with less fatigue     Currently in Pain?  Yes    Pain Score  8     Pain Location  Leg    Pain Orientation  Right;Left  Pain Descriptors / Indicators  Burning   describes pain as nerve pain   Pain Type  Chronic pain    Pain Radiating Towards  from lower back region down B LE's but denies lower back and hip pain.     Pain Onset  More than a month ago    Pain Frequency  Constant    Aggravating Factors   Pain worsens in the night    Pain Relieving Factors  Nothing makes it better     Multiple Pain Sites  Yes    Pain Score  8    Pain Location  Arm    Pain Orientation  Right;Left    Pain Descriptors / Indicators  Aching;Dull   arthritis   Pain Type  Chronic pain    Pain Onset  More than a month ago    Aggravating Factors   arthritis pain that does not improve with movement. Pt reports constant pain with no specific aggravating factors     Pain Relieving Factors  pain  medication every 8 hours but does not abolish symptoms         [facility] PT Assessment - 12/12/17 1501      Assessment   Medical Diagnosis  Pain in both lower extremities; gait difficulty    Referring Provider  Joycelyn Schmid, MD    Onset Date/Surgical Date  10/21/17   Date of GI bleed   Hand Dominance  Right    Prior Therapy  1.5 years ago patient received physical therapy at this clinic for stroke diagnosis with Pt denying he had a stroke       Precautions   Precautions  Other (comment)    Precaution Comments  Anemia, ascites, chronic leg pain, cirrhosis, GERD, h/o diabetes, hepatitis C, adrenal insufficiency, ARDS, PNA, blood transfusions, hypothyroidism, kidney disease, neuropathy, overdose, peripheral neuropathy, personality disorder, PTSD , schizoaffective disorder, cervical spine surgery and right hip surgery      Restrictions   Weight Bearing Restrictions  No      Balance Screen   Has the patient fallen in the past 6 months  Yes   Pt reports falling 3-4 weeks ago    How many times?  1    Has the patient had a decrease in activity level because of a fear of falling?   No    Is the patient reluctant to leave their home because of a fear of falling?   No      Home Public house manager residence    [place] Arrangements  Parent   lives with his mother sophia   Available Help at Discharge  Family    Type of Home  Apartment    Home Access  --   Pt did not specify    Home Layout  Multi-level    Alternate Level Stairs-Number of Steps  13    Alternate Level Stairs-Rails  None    Home Equipment  Cane - single point    Additional Comments  was previously using a RW.  Has shoe with lift in it but does not current wear      Prior Function   Level of Independence  Independent with household mobility without device;Independent with community mobility with device;Independent with basic ADLs   uses cane   Vocation  Unemployed      Cognition   Overall Cognitive  Status  Difficult to assess    Difficult to assess due to  --   question Pt  is poor historian with prior psych diagnosis      Observation/Other Assessments   Observations  Pt has observable pectus carinatum       Sensation   Light Touch  Appears Intact      Coordination   Gross Motor Movements are Fluid and Coordinated  No    Coordination and Movement Description  Pt has remarkable leg length discrepency with L LE > R LE leading to gait deviations     Heel Shin Test  R=L and both are WNL      Posture/Postural Control   Posture/Postural Control  Postural limitations    Postural Limitations  Rounded Shoulders;Forward head;Increased thoracic kyphosis;Posterior pelvic tilt;Flexed trunk    Posture Comments  L LE knee valgus and flexed in stance, trunk demonstrates curvature with L convexity (L trunk elongation, R trunk shortening), R pelvis elevated, pectus carinatum (pigeon chest), significant L foot eversion and medial arch plantarflexion (collapsed medial arch)      ROM / Strength   AROM / PROM / Strength  Strength      Strength   Overall Strength  Deficits    Overall Strength Comments  Pt overall 4/5 with greatest strength deficits to hip musculature when assessed during hip flexion. View results below.     Strength Assessment Site  Knee;Ankle;Hip    Right/Left Hip  Right;Left    Right Hip Flexion  2/5    Right Hip ABduction  4/5    Right Hip ADduction  4/5    Left Hip Flexion  3/5    Left Hip ABduction  4/5    Left Hip ADduction  4/5    Right/Left Knee  Right;Left    Right Knee Flexion  4/5    Right Knee Extension  4/5    Left Knee Flexion  4/5    Left Knee Extension  4/5    Right/Left Ankle  Right;Left    Right Ankle Dorsiflexion  4/5    Left Ankle Dorsiflexion  4/5      Transfers   Transfers  Sit to Stand;Stand to Sit    Sit to Stand  5: Supervision    Stand to Sit  5: Supervision    Comments  Pt able to stand from standard height chair using B UE's for assist.  Attempted multiple stands with no UE but Pt unable to achieve stand possibly 2/2 dec hip strength and LE strength. Formally assess hip extension strength next session.      Ambulation/Gait   Ambulation/Gait  Yes    Ambulation/Gait Assistance  5: Supervision    Ambulation Distance (Feet)  40 Feet    Assistive device  Straight cane    Gait Pattern  Step-through pattern;Decreased arm swing - right;Decreased arm swing - left;Decreased step length - right;Decreased step length - left;Decreased stride length;Decreased dorsiflexion - right;Decreased dorsiflexion - left;Left flexed knee in stance;Right foot flat;Left foot flat;Lateral trunk lean to right;Decreased trunk rotation;Trunk flexed;Poor foot clearance - left;Poor foot clearance - right;Right genu recurvatum;Antalgic;Trendelenburg    Ambulation Surface  Level;Indoor    Gait velocity  1.95 ft/sec indicating limited community ambulator       Standardized Balance Assessment   Standardized Balance Assessment  Berg Balance Test;10 meter walk test    10 Meter Walk  1.95 ft/sec with SPC      Berg Balance Test   Sit to Stand  Able to stand  independently using hands    Standing Unsupported  Able to stand 2 minutes with supervision  Sitting with Back Unsupported but Feet Supported on Floor or Stool  Able to sit safely and securely 2 minutes    Stand to Sit  Controls descent by using hands    Transfers  Able to transfer safely, definite need of hands    Standing Unsupported with Eyes Closed  Able to stand 10 seconds with supervision    Standing Ubsupported with Feet Together  Able to place feet together independently and stand for 1 minute with supervision    From Standing, Reach Forward with Outstretched Arm  Can reach confidently >25 cm (10")    From Standing Position, Pick up Object from Floor  Able to pick up shoe, needs supervision    From Standing Position, Turn to Look Behind Over each Shoulder  Looks behind one side only/other side shows  less weight shift    Turn 360 Degrees  Able to turn 360 degrees safely but slowly    Standing Unsupported, Alternately Place Feet on Step/Stool  Needs assistance to keep from falling or unable to try   Unable to perform SLS on L LE without cane    Standing Unsupported, One Foot in Front  Able to plae foot ahead of the other independently and hold 30 seconds    Standing on One Leg  Tries to lift leg/unable to hold 3 seconds but remains standing independently    Total Score  38    Berg comment:  Initial 38/56   38/56 indicates significant fall risk                Objective measurements completed on examination: See above findings.              PT Education - 12/12/17 1605    Education Details  Clinical findings on balance assessment, Medicaid coverage and POC, progression with therapy and establishment of HEP and reassessment next visit     Person(s) Educated  Patient;Parent(s)    Methods  Explanation    Comprehension  Verbalized understanding         PT Long Term Goals - 12/12/17 1627      PT LONG TERM GOAL #1   Title  Patient demonstrates understanding and independence with HEP to improve strength, flexibility, balance & endurance.     Baseline  Pt currently dependent with HEP    Time  4    Period  Weeks    Status  New    Target Date  01/11/18      PT LONG TERM GOAL #2   Title  Pt will ambulate 500 ft with SPC navigating uneven surfaces, inclines, grass, and curbs to demonstrate improvement in functional mobility and endurance with community distances.    Baseline  Pt currently reports he is able to ambulate short distances with SPC and has observable SOB episodes upon exertion requiring seated rest breaks    Time  4    Period  Weeks    Status  New    Target Date  01/11/18      PT LONG TERM GOAL #3   Title  Pt will improve Berg Balance Score to >45/56 to demonstrate lower fall risk potential and improvement with standing balance     Baseline  38/56  indicating moderate fall risk    Time  4    Period  Weeks    Status  New    Target Date  01/11/18      PT LONG TERM GOAL #4   Title  Pt  will participate in dynamic gait assessment to establish baseline fall risk during functional mobility     Baseline  not assessed     Time  4    Period  Weeks    Status  New    Target Date  01/11/18      PT LONG TERM GOAL #5   Title  Pt will improve gait speed from 1. 9 ft/sec to >/= 2.15ft/sec indicating community ambulator with significant improvement     Baseline  1.9 ft/sec using SPC    Time  4    Period  Weeks    Status  New    Target Date  01/11/18      Additional Long Term Goals   Additional Long Term Goals  --      PT LONG TERM GOAL #6   Title  Pt will bring in R LE shoe lift and participate in assessment of gait with further orthotic evaluation as needed to improve safety with gait and functional mobility     Baseline  Pt reports he has R LE shoe lift to compensate for leg length discrepency and will bring in shoe lift next session    Time  4    Period  Weeks    Status  New    Target Date  01/11/18             Plan - 12/12/17 1609    Clinical Impression Statement  Pt is a 57 year old male referred to Neuro OPPT for evaluation of strength, balance and functional mobility referred to therapy for pain in both legs and gait difficulty. Pt's PMH is significant for the following: Hepatitis C, CKD, PTSD, R hip fx 20 yrs ago with multiple surgeries, pneumonia 20 years ago, chronic leg pain, GERD, adrenal insufficiency, ARDS, blood transfusion, peripheral neurapathy, and schizoaffective disorder. The following deficits were noted during pt's exam: Pt's gait speed of 1.95 ft/sec indicates pt is categorized as a limited community ambulator . Pt's score of 38/56 on the Berg Balance Assessment indicates pt is at significant risk for falls. Pt would benefit from skilled PT to address these impairments and functional limitations to maximize  functional mobility independence and reduce falls risk.    History and Personal Factors relevant to plan of care:  difficult to get a clear history of patient's progression of symptoms and medical work up due to language barrier.  Lives with mother in 2nd level apartment-fully flight of stairs to negotiate to enter.  Deconditioned and becomes SOB with light activity.  Multiple pre-morbid medical issues.  Chronic pain and arthritis; pain management with medication resulted in GI bleed. Pt previously received PT at this clinic 2/2 to stroke diagnosis; however, Pt denies he has never had a stroke.  Diagnosed with multiple psychiatric disorders.     Clinical Presentation  Evolving    Clinical Presentation due to:  Anemia, ascites, chronic leg pain, cirrhosis, GERD, h/o diabetes, hepatitis C, adrenal insufficiency, ARDS, PNA, blood transfusions, hypothyroidism, kidney disease, overdose, peripheral neuropathy, personality disorder, PTSD , schizoaffective disorder, cervical spine surgery and right hip surgery    Clinical Decision Making  Moderate    Rehab Potential  Fair    Clinical Impairments Affecting Rehab Potential  Chronic nerve pain in B LE's and pain in UE's 2/2 arthritis. multiple pre-morbid medical issues.  limited progress with previous therapy     PT Frequency  1x / week    PT Duration  3 weeks  PT Treatment/Interventions  ADLs/Self Care Home Management;Stair training;Patient/family education;Functional mobility training;Passive range of motion;Therapeutic activities;Therapeutic exercise;Energy conservation;Balance training;Neuromuscular re-education;Manual techniques;Gait training;DME Instruction;Cognitive remediation;Aquatic Therapy;Orthotic Fit/Training;Taping    PT Next Visit Plan  assess DGI and reset goal baseline.  establish HEP; assess hip extension strength and LE flexibility; Pt reports he will bring in shoe lift and re assess gait, LE strengthening     PT Home Exercise Plan  TBD     Recommended Other Services  TBD    Consulted and Agree with Plan of Care  Patient;Family member/caregiver    Family Member Consulted  Mother- Sophia       Patient will benefit from skilled therapeutic intervention in order to improve the following deficits and impairments:  Abnormal gait, Decreased range of motion, Difficulty walking, Cardiopulmonary status limiting activity, Decreased endurance, Decreased activity tolerance, Pain, Decreased balance, Decreased knowledge of use of DME, Decreased cognition, Decreased mobility, Decreased strength, Postural dysfunction, Impaired sensation, Impaired flexibility  Visit Diagnosis: Other abnormalities of gait and mobility  Repeated falls  Muscle weakness (generalized)  Pain in left leg  Pain in right leg  Abnormal posture     Problem List Patient Active Problem List   Diagnosis Date Noted  . Generalized abdominal pain   . Hematemesis with nausea   . Evaluation by psychiatric service required   . Acute upper gastrointestinal bleeding 10/15/2017  . PTSD (post-traumatic stress disorder)   . Hypothyroidism   . History of adrenal insufficiency   . H/O diabetes insipidus   . Schizoaffective disorder (HCC) 11/08/2011  . Personality disorder (HCC)   . Anemia   . Ascites   . Liver disease   . Hepatitis C   . Kidney disease   . GERD (gastroesophageal reflux disease)   . Psychosis (HCC) 11/07/2011    [name], SPT 12/13/2017, 3:30 PM  [facility] [address] [place], [place], [address] Phone: [phone]   Fax:  [phone]  Name: [name] MRN: [id number] Date of Birth: 11/09/1960

## 2017-12-23 ENCOUNTER — Ambulatory Visit: Payer: Medicaid Other | Admitting: Physical Therapy

## 2017-12-23 ENCOUNTER — Encounter: Payer: Self-pay | Admitting: Physical Therapy

## 2017-12-23 DIAGNOSIS — R293 Abnormal posture: Secondary | ICD-10-CM

## 2017-12-23 DIAGNOSIS — R2689 Other abnormalities of gait and mobility: Secondary | ICD-10-CM

## 2017-12-23 DIAGNOSIS — M6281 Muscle weakness (generalized): Secondary | ICD-10-CM

## 2017-12-23 NOTE — Therapy (Signed)
[facility] [address] [place], [place], [address] Phone: [phone]   Fax:  [phone]  Physical Therapy Treatment  Patient Details  Name: [name] MRN: [id number] Date of Birth: 02-13-1961 Referring Provider (PT): Joycelyn Schmid, MD   Encounter Date: 12/23/2017  PT End of Session - 12/23/17 1350    Visit Number  2    Number of Visits  4   eval + 3 visits approved by Medicaid   Date for PT Re-Evaluation  01/11/18    Authorization Type  Medicaid [employer] Access     Authorization Time Period  CCME approved 3 visits from 9/27 - 01/12/18    Authorization - Visit Number  1    Authorization - Number of Visits  3    PT Start Time  1145    PT Stop Time  1237    PT Time Calculation (min)  52 min    Equipment Utilized During Treatment  Gait belt;Oxygen;Other (comment)   Assessed BP   Activity Tolerance  Other (comment)   patient limited by dizziness    Behavior During Therapy  [facility] for tasks assessed/performed       Past Medical History:  Diagnosis Date  . Anemia   . Ascites   . Chronic leg pain   . Cirrhosis (HCC)   . GERD (gastroesophageal reflux disease)   . H/O diabetes insipidus   . Hepatitis C    Completed therapy with [facility] ~ 2018.  suspect therapy overseen by [facility] in [place], [place]  . History of adrenal insufficiency   . History of ARDS   . History of blood transfusion   . Hypothyroidism   . Kidney disease    stage 3  . Neuropathy   . Overdose 12/2009  . Peripheral neuropathy   . Personality disorder (HCC)   . PTSD (post-traumatic stress disorder)    SECONDARY TO WAR IN [place]  . Schizoaffective disorder [facility])     Past Surgical History:  Procedure Laterality Date  . CERVICAL SPINE SURGERY  unknown  . RIGHT HIP SURGERY  1996  . US GUIDED LEFT THORACENTESIS  01/21/2010    There were no vitals filed for this visit.  Subjective Assessment - 12/23/17 1154     Subjective  Pt reports he is doing well since his evaluation and brought his shoe lift with him today. Pt reports experiencing no falls recently and continues to demonstrate observable SOB upon exertion. Pt reports he received his shoe lifts over a year ago; however, does not wear his shoe lifts 2/2 to increased heaviness and inability to drive and perform stairs with them on, requiring him to switch out his shoes frequently.     Patient is accompained by:  Family member    Pertinent History  Anemia, ascites, chronic leg pain, cirrhosis, GERD, h/o diabetes insipidus, hepatitis C, adrenal insufficiency, ARDS, PNA, blood transfusions, hypothyroidism, kidney disease, neuropathy, overdose, peripheral neuropathy, personality disorder, PTSD , schizoaffective disorder, cervical spine surgery and right hip surgery    Limitations  Standing;Walking;Lifting    Patient Stated Goals  Get stronger and ambulate further distances with less fatigue     Currently in Pain?  Yes    Pain Score  8     Pain Location  Generalized    Pain Orientation  --   Pt points to general pain to UE & LE's   Pain Descriptors / Indicators  Other (Comment)   Patient describes pain as "warm  pain"   Pain Type  Chronic pain    Pain Onset  More than a month ago    Pain Frequency  Constant    Aggravating Factors   Pain continues to worsen at night. Pt reports he does not notice pain when he is moving around during the day.     Pain Relieving Factors  Patient reports taking pain medication which helps temporarily. Continues to take pain medication every 8 hours.     Multiple Pain Sites  No    Pain Onset  --         [facility] PT Assessment - 12/23/17 1337      6 Minute Walk- Baseline   6 Minute Walk- Baseline  yes   Pt performs 2 min walk test    BP (mmHg)  103/59   Pt reports dizziness at rest   HR (bpm)  63    02 Sat (%RA)  96 %    Modified Borg Scale for Dyspnea  0.5- Very, very slight shortness of breath    Perceived Rate of  Exertion (Borg)  6-      6 Minute walk- Post Test   6 Minute Walk Post Test  yes    BP (mmHg)  129/60    HR (bpm)  101    02 Sat (%RA)  97 %    Modified Borg Scale for Dyspnea  3- Moderate shortness of breath or breathing difficulty    Perceived Rate of Exertion (Borg)  7- Very, very light      6 minute walk test results    Aerobic Endurance Distance Walked  165   2 min walk test                   [facility] Adult PT Treatment/Exercise - 12/23/17 1337      Transfers   Transfers  Sit to Stand;Stand to Sit    Sit to Stand  5: Supervision    Stand to Sit  5: Supervision    Comments  Patient able to stand with increased time required using SPC. Therapist provided close supervision today 2/2 to patient reporting increased dizziness and seeing "black spots"      Ambulation/Gait   Ambulation/Gait  Yes    Ambulation/Gait Assistance  5: Supervision    Ambulation/Gait Assistance Details  Patient attempted to perform x1 gait trial with his shoe lifts; however, Pt reported he did not feel comfortable or stable when wearing the shoes requesting to sit down.    Ambulation Distance (Feet)  165 Feet   Assessed during 2 min walk test    Assistive device  Straight cane    Gait Pattern  Step-through pattern;Decreased arm swing - right;Decreased arm swing - left;Decreased step length - right;Decreased step length - left;Decreased stride length;Decreased dorsiflexion - right;Decreased dorsiflexion - left;Left flexed knee in stance;Right foot flat;Left foot flat;Lateral trunk lean to right;Decreased trunk rotation;Trunk flexed;Poor foot clearance - left;Poor foot clearance - right;Right genu recurvatum;Antalgic;Trendelenburg    Ambulation Surface  Level;Indoor      Posture/Postural Control   Posture/Postural Control  Postural limitations    Postural Limitations  Rounded Shoulders;Forward head;Increased thoracic kyphosis;Posterior pelvic tilt;Flexed trunk    Posture Comments  L LE knee valgus and  flexed in stance, trunk demonstrates curvature with L convexity (L trunk elongation, R trunk shortening), R pelvis elevated, pectus carinatum (pigeon chest), significant L foot eversion and medial arch plantarflexion (collapsed medial arch)  PT Education - 12/23/17 1348    Education Details  Education on eating prior to therapy to prevent dizziness, continuing to implement practice with his shoe lift into therapy sessions, and reassessing his LLD with objective measurements.     Person(s) Educated  Patient;Parent(s)    Methods  Explanation    Comprehension  Verbalized understanding         PT Long Term Goals - 12/24/17 1226      PT LONG TERM GOAL #1   Title  (All LTG due by 01/11/2018)  Patient demonstrates understanding and independence with HEP to improve strength, flexibility, balance & endurance.     Baseline  Pt currently dependent with HEP    Time  4    Period  Weeks    Status  New    Target Date  01/11/18      PT LONG TERM GOAL #2   Title  Pt will increase distance on 2 minute walk test by 75' with use of SPC with mild SOB to demonstrate improved endurance for ambulation community distances.    Baseline  165' on 2 min walk test; moderate SOB    Time  4    Period  Weeks    Status  New      PT LONG TERM GOAL #3   Title  Pt will improve Berg Balance Score to >45/56 to demonstrate lower fall risk potential and improvement with standing balance     Baseline  38/56 indicating moderate fall risk    Time  4    Period  Weeks    Status  New      PT LONG TERM GOAL #4   Title  Pt will participate in dynamic gait assessment to establish baseline fall risk during functional mobility     Baseline  not assessed     Time  4    Period  Weeks    Status  New      PT LONG TERM GOAL #5   Title  Pt will improve gait speed from 1. 9 ft/sec to >/= 2.75ft/sec indicating community ambulator with significant improvement     Baseline  1.9 ft/sec using SPC    Time  4     Period  Weeks    Status  New      PT LONG TERM GOAL #6   Title  Pt will bring in R LE shoe lift and participate in assessment of gait with further orthotic evaluation as needed to improve safety with gait and functional mobility     Baseline  Pt reports he has R LE shoe lift to compensate for leg length discrepency and will bring in shoe lift next session    Time  4    Period  Weeks    Status  New            Plan - 12/23/17 1351    Clinical Impression Statement  Today's session primarily focused on patient performing the 2 min walk test with his SPC to assess Pt's endurance. Pt demonstrates moderate SOB s/p physical exertion requiring prolonged rest breaks with therapist instructing him to perform pursed lip breathing. Pt reports increased dizziness with black spots in his peripheral vision throughout session duration and therapist assessed patient for orthostatic hypotension. Based on the blood pressure findings, patient is negative for orthostatic hypotension. Therapist attempted to implement gait training with Pt wearing his shoe lifts; however, patient reports he experiences difficulty walking with his shoes lifts 2/2  increased heaviness causing difficulty with driving and stair negotiation. Next session will primarily focus on assessing his LLD with objective measurements to determine if his shoe lifts are appropriate 2/2 to patient indicating they may be .5 inches too big. Pt will continue to benefit from skilled physical therapy to address his functional deficits and improve his independence.     Rehab Potential  Fair    Clinical Impairments Affecting Rehab Potential  Chronic nerve pain in B LE's and pain in UE's 2/2 arthritis. multiple pre-morbid medical issues.  limited progress with previous therapy     PT Frequency  1x / week    PT Duration  3 weeks    PT Treatment/Interventions  ADLs/Self Care Home Management;Stair training;Patient/family education;Functional mobility  training;Passive range of motion;Therapeutic activities;Therapeutic exercise;Energy conservation;Balance training;Neuromuscular re-education;Manual techniques;Gait training;DME Instruction;Cognitive remediation;Aquatic Therapy;Orthotic Fit/Training;Taping    PT Next Visit Plan  assess LLD with measurements and compare to his shoe lifts, step ups with shoe lifts and gait in // bars, DGI and reset goal baseline.  establish HEP; assess hip extension strength and LE flexibility; Pt reports he will bring in shoe lift and re assess gait, LE strengthening     PT Home Exercise Plan  TBD    Consulted and Agree with Plan of Care  Patient;Family member/caregiver    Family Member Consulted  Mother- Sophia       Patient will benefit from skilled therapeutic intervention in order to improve the following deficits and impairments:  Abnormal gait, Decreased range of motion, Difficulty walking, Cardiopulmonary status limiting activity, Decreased endurance, Decreased activity tolerance, Pain, Decreased balance, Decreased knowledge of use of DME, Decreased cognition, Decreased mobility, Decreased strength, Postural dysfunction, Impaired sensation, Impaired flexibility  Visit Diagnosis: Other abnormalities of gait and mobility  Muscle weakness (generalized)  Abnormal posture     Problem List Patient Active Problem List   Diagnosis Date Noted  . Generalized abdominal pain   . Hematemesis with nausea   . Evaluation by psychiatric service required   . Acute upper gastrointestinal bleeding 10/15/2017  . PTSD (post-traumatic stress disorder)   . Hypothyroidism   . History of adrenal insufficiency   . H/O diabetes insipidus   . Schizoaffective disorder (HCC) 11/08/2011  . Personality disorder (HCC)   . Anemia   . Ascites   . Liver disease   . Hepatitis C   . Kidney disease   . GERD (gastroesophageal reflux disease)   . Psychosis (HCC) 11/07/2011    Carney Living, SPT 12/23/2017, 1:58 PM  [facility] [address] [place], [place], [address] Phone: [phone]   Fax:  [phone]  Name: [name] MRN: [id number] Date of Birth: December 25, 1960

## 2017-12-27 ENCOUNTER — Ambulatory Visit: Payer: Medicaid Other | Admitting: Rehabilitation

## 2018-01-03 ENCOUNTER — Encounter: Payer: Self-pay | Admitting: Rehabilitation

## 2018-01-03 ENCOUNTER — Ambulatory Visit: Payer: Medicaid Other | Attending: Internal Medicine | Admitting: Rehabilitation

## 2018-01-03 DIAGNOSIS — R2689 Other abnormalities of gait and mobility: Secondary | ICD-10-CM | POA: Insufficient documentation

## 2018-01-03 DIAGNOSIS — R293 Abnormal posture: Secondary | ICD-10-CM | POA: Diagnosis present

## 2018-01-03 NOTE — Therapy (Signed)
[facility] [address] [place], [place], [address] Phone: [phone]   Fax:  [phone]  Physical Therapy Treatment  Patient Details  Name: [name] MRN: [id number] Date of Birth: 10/21/1960 Referring Provider (PT): Joycelyn Schmid, MD   Encounter Date: 01/03/2018  PT End of Session - 01/03/18 1458    Visit Number  3    Number of Visits  4    Date for PT Re-Evaluation  01/11/18    Authorization Type  Medicaid [employer] Access     Authorization Time Period  CCME approved 3 visits from 9/27 - 01/12/18    Authorization - Visit Number  2    Authorization - Number of Visits  3    PT Start Time  1400    PT Stop Time  1445    PT Time Calculation (min)  45 min    Equipment Utilized During Treatment  Gait belt    Activity Tolerance  Patient tolerated treatment well    Behavior During Therapy  [facility] for tasks assessed/performed       Past Medical History:  Diagnosis Date  . Anemia   . Ascites   . Chronic leg pain   . Cirrhosis (HCC)   . GERD (gastroesophageal reflux disease)   . H/O diabetes insipidus   . Hepatitis C    Completed therapy with [facility] ~ 2018.  suspect therapy overseen by [facility] clinic in [place], [place]  . History of adrenal insufficiency   . History of ARDS   . History of blood transfusion   . Hypothyroidism   . Kidney disease    stage 3  . Neuropathy   . Overdose 12/2009  . Peripheral neuropathy   . Personality disorder (HCC)   . PTSD (post-traumatic stress disorder)    SECONDARY TO WAR IN [place]  . Schizoaffective disorder [name])     Past Surgical History:  Procedure Laterality Date  . CERVICAL SPINE SURGERY  unknown  . RIGHT HIP SURGERY  1996  . US GUIDED LEFT THORACENTESIS  01/21/2010    There were no vitals filed for this visit.  Subjective Assessment - 01/03/18 1402    Subjective  Reports bringing shoe lifts today to assess whether they are the correct size.  Reports he continues to have pain everywhere and took "pain pill 4 hours ago" with no relief. Reports he continues to have difficulty walking 2/2 pain and can only walk short distances with his converse and around his house barefoot. Patient's pain continues to be the worst during the night time reporting he is less aware of the pain when he is busy thoughout the day.      Patient is accompained by:  Family member    Pertinent History  Anemia, ascites, chronic leg pain, cirrhosis, GERD, h/o diabetes insipidus, hepatitis C, adrenal insufficiency, ARDS, PNA, blood transfusions, hypothyroidism, kidney disease, neuropathy, overdose, peripheral neuropathy, personality disorder, PTSD , schizoaffective disorder, cervical spine surgery and right hip surgery    Limitations  Standing;Walking;Lifting    Patient Stated Goals  Get stronger and ambulate further distances with less fatigue     Currently in Pain?  Yes    Pain Score  7     Pain Location  Other (Comment)   generalized   Pain Orientation  Other (Comment)   generalized body pain   Pain Descriptors / Indicators  Aching;Discomfort    Pain Type  Chronic pain    Pain Onset  More than a  month ago    Pain Frequency  Constant    Multiple Pain Sites  No                       [facility] Adult PT Treatment/Exercise - 01/03/18 1447      Transfers   Transfers  Sit to Stand;Stand to Sit    Sit to Stand  5: Supervision    Stand to Sit  5: Supervision      Posture/Postural Control   Posture/Postural Control  Postural limitations    Posture Comments  Therapist assesses patient posture in standing with R LE shoe lift and L LE converse. Pt demonstrates postural limitations including, L trunk convexity, L knee valgus, and R pelvic elevation with inability to self correct. Therapist notes L hamstring tightness and quad weakness indicated by palpable muscle fibrillations during prolonged stand for 3 min duration.       Therapeutic Activites     Therapeutic Activities  Other Therapeutic Activities    Other Therapeutic Activities  Therapist provided patient education regarding the importance of wearing his shoe lifts during all ambulation trials to assist with pelvic stability and optimizing posture. Therapist educates the possibility of placing patient on hold until he returns to [employer] for shoe lifts that are the correct size. Therapist performs leg length discrepency measurements with landmarks being ASIS to medial malleoli. Patient's L LE measurement= 42 inches & his R LE measurement is 39.75 inches. His current R shoe lift measures to be 1.75 inches and may not be the correct size. Patient continues to indicate his R shoe lift is .5 inches too big and he does not wear them 2/2 to heaviness and instability during ambulation. Therapist recommends considering getting new shoe lifts to assist with optimizing his posture which may create reductions in pain. Patient verbalizes understanding and will return for his next session to establish HEP before possibly being placed on hold.              PT Education - 01/03/18 1456    Education Details  Therapist provided patient education regarding the importance of scheduling an appointment with [employer] to reassess whether or not his shoe lifts are the correct size. Therapist recommends patient return for his last visit next week for establishment of initial HEP and then be placed on hold until correct shoe lifts are made. Pt verbalizes understanding.     Person(s) Educated  Patient;Parent(s)    Methods  Explanation    Comprehension  Verbalized understanding       PT Short Term Goals - 12/12/17 1618      PT SHORT TERM GOAL #1   Title  --    Baseline  --    Time  --    Period  --    Status  --    Target Date  --      PT SHORT TERM GOAL #2   Title  --    Baseline  --    Time  --    Period  --    Status  --    Target Date  --      PT SHORT TERM GOAL #3   Title  --    Baseline  --     Time  --    Period  --    Status  --    Target Date  --      PT SHORT TERM GOAL #4   Title  --  Baseline  --    Time  --    Period  --    Status  --    Target Date  --      PT SHORT TERM GOAL #5   Title  --    Baseline  --    Time  --    Period  --    Status  --    Target Date  --      Additional Short Term Goals   Additional Short Term Goals  --      PT SHORT TERM GOAL #6   Title  --    Baseline  --    Time  --    Period  --    Status  --    Target Date  --        PT Long Term Goals - 12/24/17 1226      PT LONG TERM GOAL #1   Title  (All LTG due by 01/11/2018)  Patient demonstrates understanding and independence with HEP to improve strength, flexibility, balance & endurance.     Baseline  Pt currently dependent with HEP    Time  4    Period  Weeks    Status  New    Target Date  01/11/18      PT LONG TERM GOAL #2   Title  Pt will increase distance on 2 minute walk test by 40' with use of SPC with mild SOB to demonstrate improved endurance for ambulation community distances.    Baseline  165' on 2 min walk test; moderate SOB    Time  4    Period  Weeks    Status  New      PT LONG TERM GOAL #3   Title  Pt will improve Berg Balance Score to >45/56 to demonstrate lower fall risk potential and improvement with standing balance     Baseline  38/56 indicating moderate fall risk    Time  4    Period  Weeks    Status  New      PT LONG TERM GOAL #4   Title  Pt will participate in dynamic gait assessment to establish baseline fall risk during functional mobility     Baseline  not assessed     Time  4    Period  Weeks    Status  New      PT LONG TERM GOAL #5   Title  Pt will improve gait speed from 1. 9 ft/sec to >/= 2.57ft/sec indicating community ambulator with significant improvement     Baseline  1.9 ft/sec using SPC    Time  4    Period  Weeks    Status  New      PT LONG TERM GOAL #6   Title  Pt will bring in R LE shoe lift and participate in  assessment of gait with further orthotic evaluation as needed to improve safety with gait and functional mobility     Baseline  Pt reports he has R LE shoe lift to compensate for leg length discrepency and will bring in shoe lift next session    Time  4    Period  Weeks    Status  New            Plan - 01/03/18 1459    Clinical Impression Statement  Today's skilled session focused on measuring patient's leg length discrepency to assess whether his shoe lifts are the correct  size. Based on the measurements noted, patient may benefit from revisiting [employer] to inquire about getting new shoe lifts made to assist with optimizing his posture and possibly reducing his pain 2/2 to compensatory postural limitations. Pt verbalizes understanding and indicates he would like to get new shoe lifts to improve his functional mobility. Next session, therapist will initiate strengthening HEP to assist with muscular imbalances and improvement in flexibility to further improve patients functional mobility.     Rehab Potential  Fair    Clinical Impairments Affecting Rehab Potential  Chronic nerve pain in B LE's and pain in UE's 2/2 arthritis. multiple pre-morbid medical issues.  limited progress with previous therapy     PT Frequency  1x / week    PT Duration  3 weeks    PT Treatment/Interventions  ADLs/Self Care Home Management;Stair training;Patient/family education;Functional mobility training;Passive range of motion;Therapeutic activities;Therapeutic exercise;Energy conservation;Balance training;Neuromuscular re-education;Manual techniques;Gait training;DME Instruction;Cognitive remediation;Aquatic Therapy;Orthotic Fit/Training;Taping    PT Next Visit Plan  HEP, place patient on hold until he receives shoe lifts???  step ups with shoe lifts and gait in // bars, DGI and reset goal baseline.  establish HEP; assess hip extension strength and LE flexibility; Pt reports he will bring in shoe lift and re assess gait,  LE strengthening ;    PT Home Exercise Plan  TBD    Consulted and Agree with Plan of Care  Patient;Family member/caregiver    Family Member Consulted  Mother- Sophia       Patient will benefit from skilled therapeutic intervention in order to improve the following deficits and impairments:  Abnormal gait, Decreased range of motion, Difficulty walking, Cardiopulmonary status limiting activity, Decreased endurance, Decreased activity tolerance, Pain, Decreased balance, Decreased knowledge of use of DME, Decreased cognition, Decreased mobility, Decreased strength, Postural dysfunction, Impaired sensation, Impaired flexibility  Visit Diagnosis: Abnormal posture  Other abnormalities of gait and mobility     Problem List Patient Active Problem List   Diagnosis Date Noted  . Generalized abdominal pain   . Hematemesis with nausea   . Evaluation by psychiatric service required   . Acute upper gastrointestinal bleeding 10/15/2017  . PTSD (post-traumatic stress disorder)   . Hypothyroidism   . History of adrenal insufficiency   . H/O diabetes insipidus   . Schizoaffective disorder (HCC) 11/08/2011  . Personality disorder (HCC)   . Anemia   . Ascites   . Liver disease   . Hepatitis C   . Kidney disease   . GERD (gastroesophageal reflux disease)   . Psychosis (HCC) 11/07/2011    Carney Living, SPT 01/03/2018, 3:02 PM  [facility] [address] [place], [place], [address] Phone: [phone]   Fax:  [phone]  Name: [name] MRN: [id number] Date of Birth: 1960-09-29

## 2018-01-10 ENCOUNTER — Ambulatory Visit: Payer: Medicaid Other | Admitting: Rehabilitation

## 2018-01-12 ENCOUNTER — Ambulatory Visit: Payer: Self-pay | Admitting: Physical Therapy

## 2018-01-20 ENCOUNTER — Ambulatory Visit: Payer: Medicaid Other | Admitting: Rehabilitation

## 2018-01-24 ENCOUNTER — Other Ambulatory Visit: Payer: Self-pay | Admitting: Nurse Practitioner

## 2018-01-24 DIAGNOSIS — K7469 Other cirrhosis of liver: Secondary | ICD-10-CM

## 2018-01-30 ENCOUNTER — Ambulatory Visit
Admission: RE | Admit: 2018-01-30 | Discharge: 2018-01-30 | Disposition: A | Discharge status: Home / Self Care | Payer: Medicaid Other | Source: Ambulatory Visit | Attending: Nurse Practitioner | Admitting: Nurse Practitioner

## 2018-01-30 DIAGNOSIS — K7469 Other cirrhosis of liver: Secondary | ICD-10-CM

## 2018-03-13 ENCOUNTER — Emergency Department (HOSPITAL_COMMUNITY)
Admission: EM | Admit: 2018-03-13 | Discharge: 2018-03-13 | Disposition: A | Discharge status: Home / Self Care | Payer: Medicaid Other | Attending: Emergency Medicine | Admitting: Emergency Medicine

## 2018-03-13 ENCOUNTER — Emergency Department (HOSPITAL_COMMUNITY): Payer: Medicaid Other

## 2018-03-13 ENCOUNTER — Encounter (HOSPITAL_COMMUNITY): Payer: Self-pay | Admitting: Emergency Medicine

## 2018-03-13 DIAGNOSIS — Y999 Unspecified external cause status: Secondary | ICD-10-CM | POA: Insufficient documentation

## 2018-03-13 DIAGNOSIS — S82141A Displaced bicondylar fracture of right tibia, initial encounter for closed fracture: Secondary | ICD-10-CM | POA: Insufficient documentation

## 2018-03-13 DIAGNOSIS — N183 Chronic kidney disease, stage 3 (moderate): Secondary | ICD-10-CM | POA: Diagnosis not present

## 2018-03-13 DIAGNOSIS — S8991XA Unspecified injury of right lower leg, initial encounter: Secondary | ICD-10-CM | POA: Diagnosis present

## 2018-03-13 DIAGNOSIS — Y929 Unspecified place or not applicable: Secondary | ICD-10-CM | POA: Insufficient documentation

## 2018-03-13 DIAGNOSIS — W19XXXA Unspecified fall, initial encounter: Secondary | ICD-10-CM | POA: Diagnosis not present

## 2018-03-13 DIAGNOSIS — Z79899 Other long term (current) drug therapy: Secondary | ICD-10-CM | POA: Insufficient documentation

## 2018-03-13 DIAGNOSIS — Y939 Activity, unspecified: Secondary | ICD-10-CM | POA: Insufficient documentation

## 2018-03-13 DIAGNOSIS — E039 Hypothyroidism, unspecified: Secondary | ICD-10-CM | POA: Insufficient documentation

## 2018-03-13 DIAGNOSIS — F17210 Nicotine dependence, cigarettes, uncomplicated: Secondary | ICD-10-CM | POA: Insufficient documentation

## 2018-03-13 MED ORDER — OXYCODONE HCL 5 MG PO TABS
5.0000 mg | ORAL_TABLET | Freq: Once | ORAL | Status: AC
  Administered 2018-03-13: 5 mg via ORAL
  Filled 2018-03-13: qty 1

## 2018-03-13 NOTE — Discharge Instructions (Addendum)
There is a fracture (broken bone) in the knee/upper shin. Keep the immobilizer in place at all times when not showering.  Use a walker at all times to help you get around.  Do not use a cane as this will not be stable enough.  Follow-up with the orthopedic specialist as soon as possible.  Call the number provided to set up an appointment.

## 2018-03-13 NOTE — ED Triage Notes (Signed)
Per [facility] pt from home for right knee pain from fall earlier today.  Vitals: 120s systolic BP

## 2018-03-13 NOTE — ED Provider Notes (Addendum)
[facility]-EMERGENCY DEPT Provider Note   CSN: [id number] Arrival date & time: 03/13/18  1727     History   Chief Complaint Chief Complaint  Patient presents with  . Fall  . Knee Pain    HPI [name] is a 57 y.o. male.  HPI   [name] is a 57 y.o. male, with a history of liver cirrhosis, hepatitis C, and schizoaffective disorder, presenting to the ED with right knee injury that occurred this afternoon.  Patient sustained a mechanical fall that involved a twisting of his right knee.  Pain is throbbing, moderate to severe, nonradiating.  He was ambulatory following the fall.  His last oxycodone was around 12 PM today.  Denies anticoagulation. Denies head injury, neck/back pain, numbness, weakness, or any other complaints.     Past Medical History:  Diagnosis Date  . Anemia   . Ascites   . Chronic leg pain   . Cirrhosis (HCC)   . GERD (gastroesophageal reflux disease)   . H/O diabetes insipidus   . Hepatitis C    Completed therapy with Epclusa ~ 2018.  suspect therapy overseen by [facility] clinic in [place], [place]  . History of adrenal insufficiency   . History of ARDS   . History of blood transfusion   . Hypothyroidism   . Kidney disease    stage 3  . Neuropathy   . Overdose 12/2009  . Peripheral neuropathy   . Personality disorder (HCC)   . PTSD (post-traumatic stress disorder)    SECONDARY TO WAR IN [place]  . Schizoaffective disorder [facility])     Patient Active Problem List   Diagnosis Date Noted  . Generalized abdominal pain   . Hematemesis with nausea   . Evaluation by psychiatric service required   . Acute upper gastrointestinal bleeding 10/15/2017  . PTSD (post-traumatic stress disorder)   . Hypothyroidism   . History of adrenal insufficiency   . H/O diabetes insipidus   . Schizoaffective disorder (HCC) 11/08/2011  . Personality disorder (HCC)   . Anemia   . Ascites   . Liver disease   . Hepatitis C   . Kidney  disease   . GERD (gastroesophageal reflux disease)   . Psychosis (HCC) 11/07/2011    Past Surgical History:  Procedure Laterality Date  . CERVICAL SPINE SURGERY  unknown  . RIGHT HIP SURGERY  1996  . US GUIDED LEFT THORACENTESIS  01/21/2010        Home Medications    Prior to Admission medications   Medication Sig Start Date End Date Taking? Authorizing Provider  [employer] D3 2000 units CAPS Take 2,000 Units by mouth daily. 10/23/17  Yes [provider]  diphenhydrAMINE (BENADRYL) 25 MG tablet Take 25 mg by mouth every 6 (six) hours as needed for allergies or sleep.   Yes [provider]  fluPHENAZine (PROLIXIN) 5 MG tablet Take 5 mg by mouth 3 (three) times daily.   Yes [provider]  furosemide (LASIX) 20 MG tablet Take 20 mg by mouth daily.    Yes [provider]  gabapentin (NEURONTIN) 300 MG capsule Take 300 mg by mouth 3 (three) times daily.  10/19/17  Yes [provider]  oxyCODONE (OXY IR/ROXICODONE) 5 MG immediate release tablet Take 1 tablet (5 mg total) by mouth every 6 (six) hours as needed for severe pain. 09/08/17  Yes Garlon Hatchet, PA-C  pantoprazole (PROTONIX) 40 MG tablet Take 1 tablet (40 mg total) by mouth  2 (two) times daily. 10/18/17  Yes Simonne Martinet, NP  SENSIPAR 30 MG tablet Take 30 mg by mouth daily. 07/25/17  Yes [provider]  trihexyphenidyl (ARTANE) 2 MG tablet Take 2 mg by mouth 3 (three) times daily.   Yes [provider]  LORazepam (ATIVAN) 0.5 MG tablet Take 0.5 mg by mouth every 8 (eight) hours as needed for anxiety.     [provider]  ondansetron (ZOFRAN ODT) 8 MG disintegrating tablet Take 1 tablet (8 mg total) by mouth every 8 (eight) hours as needed for nausea or vomiting. Patient not taking: Reported on 11/02/2017 11/14/15   Azalia Bilis, MD    Family History Family History  Problem Relation Age of Onset  . Heart attack Father     Social History Social History    Tobacco Use  . Smoking status: Current Every Day Smoker    Packs/day: 1.00    Years: 25.00    Pack years: 25.00    Types: Cigarettes  . Smokeless tobacco: Never Used  Substance Use Topics  . Alcohol use: No    Comment: Pt denies   . Drug use: No    Comment: Pt denies     Allergies   Heparin; Imipramine; Lamictal [lamotrigine]; and Penicillins   Review of Systems Review of Systems  Musculoskeletal: Positive for arthralgias and joint swelling.  Neurological: Negative for weakness and numbness.     Physical Exam Updated Vital Signs BP 125/76 (BP Location: Right Arm)   Pulse 64   Temp 98.5 F (36.9 C) (Oral)   Resp 18   SpO2 97%   Physical Exam Vitals signs and nursing note reviewed.  Constitutional:      General: He is not in acute distress.    Appearance: He is well-developed. He is not diaphoretic.  HENT:     Head: Normocephalic and atraumatic.  Eyes:     Conjunctiva/sclera: Conjunctivae normal.  Neck:     Musculoskeletal: Neck supple.  Cardiovascular:     Rate and Rhythm: Normal rate and regular rhythm.     Pulses: Normal pulses.          Posterior tibial pulses are 2+ on the right side and 2+ on the left side.  Pulmonary:     Effort: Pulmonary effort is normal.  Musculoskeletal:     Comments: Patient does not have any noted point tenderness to the right knee, however, movement of the right knee causes pain.  No pain or tenderness in the right hip or ankle.  Normal motor function intact in all extremities. No midline spinal tenderness.   Skin:    General: Skin is warm and dry.     Coloration: Skin is not pale.  Neurological:     Mental Status: He is alert.     Comments: Sensation to light touch grossly intact in the right lower extremity. Strength 5/5 in the right lower extremity.  Psychiatric:        Behavior: Behavior normal.      ED Treatments / Results  Labs (all labs ordered are listed, but only abnormal results are displayed) Labs  Reviewed - No data to display  EKG None  Radiology Dg Knee Complete 4 Views Right  Result Date: 03/13/2018 CLINICAL DATA:  Right knee pain after fall today. EXAM: RIGHT KNEE - COMPLETE 4+ VIEW COMPARISON:  11/27/2015 MRI and radiographs from 04/17/2015 FINDINGS: Acute medial tibial plateau fracture with 3 mm of depression and associated moderate lipohemarthrosis. Otherwise, femorotibial and  patellofemoral osteoarthritic joint space narrowing is identified. No joint dislocation. No soft tissue mass or mineralization. Remote healed fracture deformity of the fibular neck. IMPRESSION: Acute medial tibial plateau fracture with 3 mm of depression and associated moderate lipohemarthrosis. Electronically Signed   By: Tollie Eth M.D.   On: 03/13/2018 18:50    Procedures Procedures (including critical care time)  Medications Ordered in ED Medications  oxyCODONE (Oxy IR/ROXICODONE) immediate release tablet 5 mg (5 mg Oral Given 03/13/18 2127)     Initial Impression / Assessment and Plan / ED Course  I have reviewed the triage vital signs and the nursing notes.  Pertinent labs & imaging results that were available during my care of the patient were reviewed by me and considered in my medical decision making (see chart for details).  Clinical Course as of Mar 13 2312  Mon Mar 13, 2018  2240 Spoke with Dr. Charlann Boxer, orthopedic surgeon.  He states patient can be placed in a knee immobilizer with bulky dressing. If patient is unsafe to ambulate with the knee immobilizer, admit to Dr. Nilsa Nutting service under observation with the following orders: Apply ice to the injured area. Physical therapy consult in the morning.5 to 10 mg oxycodone every 6 hours as needed for pain.Maintenance IV fluids.He can be placed on a diet as he is nonsurgical.   [SJ]  2250 I spoke with the patient regarding Dr. Nilsa Nutting recommendations.  Patient adamant about going home.  I told the patient he would need to be safe.  We will  apply the knee immobilizer and evaluate the patient for safe ambulation.   [SJ]    Clinical Course User Index [SJ] Joy, Shawn C, PA-C    Patient presents with a right knee injury.  Tibial plateau fracture noted on x-ray.   End of shift patient care handoff report given to [facility], PA-C. Plan: Evaluate patient for ambulatory safety following placement of knee immobilizer.  If he appears safe to go home, discharge.  Otherwise, speak with the patient again about observation admission. If patient is discharged, he does not need additional pain medication prescriptions.    Findings and plan of care discussed with Raeford Razor, MD.   Final Clinical Impressions(s) / ED Diagnoses   Final diagnoses:  Closed fracture of right tibial plateau, initial encounter    ED Discharge Orders    None       Concepcion Living 03/13/18 2314    Anselm Pancoast, PA-C 03/13/18 2315    Raeford Razor, MD 03/21/18 [phone]

## 2018-03-13 NOTE — ED Provider Notes (Signed)
Received patient at signout from [facility].  Refer to providers note for full history and physical examination.  Briefly, patient is a 57 year old male presenting for evaluation of right knee pain secondary to mechanical fall.  Imaging shows tibial plateau fracture.  Orthopedics has been consulted.  Patient does not wish to stay any longer.  Pending knee immobilizer placement and ambulation.  If he cannot ambulate with a steady gait, then he may require admission.  MDM  Knee immobilizer in place, patient able to ambulate without difficulty with a walker.  He has 2 walkers at home.  I reiterated the importance of using his walker to aid in ambulation while at home.  He understands to follow-up with orthopedic surgery on an outpatient basis.  I have discussed strict ED return precautions. Pt verbalized understanding of and agreement with plan and is safe for discharge home at this time.       Jeanie Sewer, PA-C 03/14/18 9528    Benjiman Core, MD 03/14/18 2303

## 2018-04-12 ENCOUNTER — Encounter (HOSPITAL_COMMUNITY): Payer: Self-pay | Admitting: *Deleted

## 2018-04-12 ENCOUNTER — Other Ambulatory Visit: Payer: Self-pay

## 2018-04-12 DIAGNOSIS — M79605 Pain in left leg: Secondary | ICD-10-CM | POA: Diagnosis not present

## 2018-04-12 DIAGNOSIS — M79604 Pain in right leg: Secondary | ICD-10-CM | POA: Diagnosis not present

## 2018-04-12 DIAGNOSIS — F17210 Nicotine dependence, cigarettes, uncomplicated: Secondary | ICD-10-CM | POA: Insufficient documentation

## 2018-04-12 DIAGNOSIS — E039 Hypothyroidism, unspecified: Secondary | ICD-10-CM | POA: Insufficient documentation

## 2018-04-12 NOTE — ED Triage Notes (Signed)
Pt reports bilateral leg pain as well as generalized pain.   Pt is ambulatory with a cane in triage.  Pt states "I only need oxycodone and nothing else."

## 2018-04-13 ENCOUNTER — Emergency Department (HOSPITAL_COMMUNITY)
Admission: EM | Admit: 2018-04-13 | Discharge: 2018-04-13 | Disposition: A | Discharge status: Home / Self Care | Payer: Medicaid Other | Attending: Emergency Medicine | Admitting: Emergency Medicine

## 2018-04-13 DIAGNOSIS — M79604 Pain in right leg: Secondary | ICD-10-CM

## 2018-04-13 DIAGNOSIS — M79605 Pain in left leg: Secondary | ICD-10-CM

## 2018-04-13 MED ORDER — OXYCODONE-ACETAMINOPHEN 5-325 MG PO TABS
1.0000 | ORAL_TABLET | Freq: Once | ORAL | Status: AC
  Administered 2018-04-13: 1 via ORAL
  Filled 2018-04-13: qty 1

## 2018-04-13 NOTE — ED Provider Notes (Signed)
Emergency Department Provider Note   I have reviewed the triage vital signs and the nursing notes.   HISTORY  Chief Complaint Leg Pain (bilateral)   HPI [name] is a 58 y.o. male with multiple medical problems documented below the presents the emergency department today secondary to arthritis pain.  Patient states he is on oxycodone at home and ran out today.  He has a appointment tomorrow with the pain clinic to get refills but pain got worse about the day today.  Has had elevated tremors and nausea.  No new falls, fevers or other associated symptoms. No other associated or modifying symptoms.    Past Medical History:  Diagnosis Date  . Anemia   . Ascites   . Chronic leg pain   . Cirrhosis (HCC)   . GERD (gastroesophageal reflux disease)   . H/O diabetes insipidus   . Hepatitis C    Completed therapy with Epclusa ~ 2018.  suspect therapy overseen by [facility] clinic in [place], [place]  . History of adrenal insufficiency   . History of ARDS   . History of blood transfusion   . Hypothyroidism   . Kidney disease    stage 3  . Neuropathy   . Overdose 12/2009  . Peripheral neuropathy   . Personality disorder (HCC)   . PTSD (post-traumatic stress disorder)    SECONDARY TO WAR IN [place]  . Schizoaffective disorder [facility])     Patient Active Problem List   Diagnosis Date Noted  . Generalized abdominal pain   . Hematemesis with nausea   . Evaluation by psychiatric service required   . Acute upper gastrointestinal bleeding 10/15/2017  . PTSD (post-traumatic stress disorder)   . Hypothyroidism   . History of adrenal insufficiency   . H/O diabetes insipidus   . Schizoaffective disorder (HCC) 11/08/2011  . Personality disorder (HCC)   . Anemia   . Ascites   . Liver disease   . Hepatitis C   . Kidney disease   . GERD (gastroesophageal reflux disease)   . Psychosis (HCC) 11/07/2011    Past Surgical History:  Procedure Laterality Date  . CERVICAL SPINE  SURGERY  unknown  . RIGHT HIP SURGERY  1996  . US GUIDED LEFT THORACENTESIS  01/21/2010    Current Outpatient Rx  . Order #: [id number] Class: Historical Med  . Order #: [id number] Class: Historical Med  . Order #: [id number] Class: Historical Med  . Order #: [id number] Class: Historical Med  . Order #: [id number] Class: Historical Med  . Order #: [id number] Class: Historical Med  . Order #: [id number] Class: Print  . Order #: [id number] Class: Print  . Order #: [id number] Class: Normal  . Order #: [id number] Class: Historical Med  . Order #: [id number] Class: Historical Med    Allergies Heparin; Imipramine; Lamictal [lamotrigine]; and Penicillins  Family History  Problem Relation Age of Onset  . Heart attack Father     Social History Social History   Tobacco Use  . Smoking status: Current Every Day Smoker    Packs/day: 1.00    Years: 25.00    Pack years: 25.00    Types: Cigarettes  . Smokeless tobacco: Never Used  Substance Use Topics  . Alcohol use: No    Comment: Pt denies   . Drug use: No    Comment: Pt denies    Review of Systems  All other systems negative except as documented in the HPI. All pertinent positives and negatives as reviewed in the HPI. ____________________________________________   PHYSICAL EXAM:  VITAL SIGNS: ED Triage Vitals  Enc Vitals Group     BP 04/12/18 2309 119/66     Pulse Rate 04/12/18 2309 81     Resp 04/12/18 2309 16     Temp 04/12/18 2309 98.5 F (36.9 C)     Temp Source 04/12/18 2309 Oral     SpO2 04/12/18 2309 96 %     Weight 04/12/18 2310 286 lb 9.6 oz (130 kg)     Height 04/12/18 2310 6\' 6"  (1.981 m)    Constitutional: Alert and oriented. Well appearing and in no acute distress.  Eyes: Conjunctivae are normal. PERRL. EOMI. Head: Atraumatic. Nose: No congestion/rhinnorhea. Mouth/Throat: Mucous membranes are moist.  Oropharynx non-erythematous. Neck: No stridor.  No meningeal signs.   Cardiovascular: Normal rate, regular rhythm.  Good peripheral circulation. Grossly normal heart sounds.   Respiratory: Normal respiratory effort.  No retractions. Lungs CTAB. Gastrointestinal: Soft and nontender. No distention.  Musculoskeletal: No lower extremity tenderness nor edema. No gross deformities of extremities. Neurologic:  Normal speech and language. No gross focal neurologic deficits are appreciated. Mild tremor noted. Skin:  Skin is warm, dry and intact. No rash noted.  ____________________________________________   INITIAL IMPRESSION / ASSESSMENT AND PLAN / ED COURSE  Chronic pain out of meds. Appears to be in mild withdrawal. Discussed we don't give refills here but I can give one dose here and he can follow up at pain clinic tomorrow for refills.    Pertinent labs & imaging results that were available during my care of the patient were reviewed by me and considered in my medical decision making (see chart for details).  ____________________________________________  FINAL CLINICAL IMPRESSION(S) / ED DIAGNOSES  Final diagnoses:  Right leg pain  Left leg pain     MEDICATIONS GIVEN DURING THIS VISIT:  Medications  oxyCODONE-acetaminophen (PERCOCET/ROXICET) 5-325 MG per tablet 1 tablet (1 tablet Oral Given 04/13/18 0052)     NEW OUTPATIENT MEDICATIONS STARTED DURING THIS VISIT:  New Prescriptions   No medications on file    Note:  This note was prepared with assistance of Dragon voice recognition software. Occasional wrong-word or sound-a-like substitutions may have occurred due to the inherent limitations of voice recognition software.   Valene Villa, Barbara CowerJason, MD 04/13/18 [phone]

## 2018-05-26 ENCOUNTER — Encounter: Payer: Self-pay | Admitting: Rehabilitation

## 2018-05-26 NOTE — Therapy (Signed)
[facility] [address] [place], [place], [address] Phone: [phone]   Fax:  [phone]  Patient Details  Name: [name] MRN: [id number] Date of Birth: 07/24/1960 Referring Provider:  No ref. provider found  Encounter Date: 05/26/2018   PHYSICAL THERAPY DISCHARGE SUMMARY  Visits from Start of Care: 3  Current functional level related to goals / functional outcomes: Unsure as pt did not return. Pt was placed on hold to get new modifications/shoe lift, but did not return.    Remaining deficits: PT Long Term Goals - 12/24/17 1226      PT LONG TERM GOAL #1   Title  (All LTG due by 01/11/2018)  Patient demonstrates understanding and independence with HEP to improve strength, flexibility, balance & endurance.     Baseline  Pt currently dependent with HEP    Time  4    Period  Weeks    Status  New    Target Date  01/11/18      PT LONG TERM GOAL #2   Title  Pt will increase distance on 2 minute walk test by 45' with use of SPC with mild SOB to demonstrate improved endurance for ambulation community distances.    Baseline  165' on 2 min walk test; moderate SOB    Time  4    Period  Weeks    Status  New      PT LONG TERM GOAL #3   Title  Pt will improve Berg Balance Score to >45/56 to demonstrate lower fall risk potential and improvement with standing balance     Baseline  38/56 indicating moderate fall risk    Time  4    Period  Weeks    Status  New      PT LONG TERM GOAL #4   Title  Pt will participate in dynamic gait assessment to establish baseline fall risk during functional mobility     Baseline  not assessed     Time  4    Period  Weeks    Status  New      PT LONG TERM GOAL #5   Title  Pt will improve gait speed from 1. 9 ft/sec to >/= 2.62ft/sec indicating community ambulator with significant improvement     Baseline  1.9 ft/sec using SPC    Time  4    Period  Weeks    Status  New      PT LONG  TERM GOAL #6   Title  Pt will bring in R LE shoe lift and participate in assessment of gait with further orthotic evaluation as needed to improve safety with gait and functional mobility     Baseline  Pt reports he has R LE shoe lift to compensate for leg length discrepency and will bring in shoe lift next session    Time  4    Period  Weeks    Status  New         Education / Equipment: n/a  Plan: Patient agrees to discharge.  Patient goals were not met. Patient is being discharged due to not returning since the last visit.  ?????      

## 2018-06-08 ENCOUNTER — Encounter (HOSPITAL_COMMUNITY): Payer: Self-pay

## 2018-06-08 ENCOUNTER — Emergency Department (HOSPITAL_COMMUNITY)
Admit: 2018-06-08 | Discharge: 2018-06-08 | Discharge status: Against Medical Advice | Payer: Medicaid Other | Attending: Emergency Medicine | Admitting: Emergency Medicine

## 2018-06-08 DIAGNOSIS — M2550 Pain in unspecified joint: Secondary | ICD-10-CM | POA: Diagnosis not present

## 2018-06-08 DIAGNOSIS — Z5321 Procedure and treatment not carried out due to patient leaving prior to being seen by health care provider: Secondary | ICD-10-CM | POA: Diagnosis not present

## 2018-06-08 NOTE — ED Triage Notes (Signed)
Pt called from triage with no answer 

## 2018-06-08 NOTE — ED Triage Notes (Signed)
Pt complains of arthritis all over his body, he has a pain clinic appt today but the pain is so bad he couldn't wait

## 2018-08-07 ENCOUNTER — Other Ambulatory Visit: Payer: Self-pay

## 2018-08-07 ENCOUNTER — Encounter (HOSPITAL_COMMUNITY): Payer: Self-pay | Admitting: Emergency Medicine

## 2018-08-07 ENCOUNTER — Inpatient Hospital Stay (HOSPITAL_COMMUNITY)
Admission: EM | Admit: 2018-08-07 | Discharge: 2018-08-08 | DRG: 378 | Discharge status: Against Medical Advice | Payer: Medicaid Other | Attending: Internal Medicine | Admitting: Internal Medicine

## 2018-08-07 ENCOUNTER — Emergency Department (HOSPITAL_COMMUNITY): Payer: Medicaid Other

## 2018-08-07 DIAGNOSIS — Z888 Allergy status to other drugs, medicaments and biological substances status: Secondary | ICD-10-CM

## 2018-08-07 DIAGNOSIS — E039 Hypothyroidism, unspecified: Secondary | ICD-10-CM | POA: Diagnosis present

## 2018-08-07 DIAGNOSIS — F259 Schizoaffective disorder, unspecified: Secondary | ICD-10-CM | POA: Diagnosis present

## 2018-08-07 DIAGNOSIS — K922 Gastrointestinal hemorrhage, unspecified: Principal | ICD-10-CM | POA: Diagnosis present

## 2018-08-07 DIAGNOSIS — M1990 Unspecified osteoarthritis, unspecified site: Secondary | ICD-10-CM | POA: Diagnosis present

## 2018-08-07 DIAGNOSIS — Z8619 Personal history of other infectious and parasitic diseases: Secondary | ICD-10-CM | POA: Diagnosis not present

## 2018-08-07 DIAGNOSIS — K219 Gastro-esophageal reflux disease without esophagitis: Secondary | ICD-10-CM | POA: Diagnosis present

## 2018-08-07 DIAGNOSIS — Z8249 Family history of ischemic heart disease and other diseases of the circulatory system: Secondary | ICD-10-CM

## 2018-08-07 DIAGNOSIS — F17210 Nicotine dependence, cigarettes, uncomplicated: Secondary | ICD-10-CM | POA: Diagnosis present

## 2018-08-07 DIAGNOSIS — B1920 Unspecified viral hepatitis C without hepatic coma: Secondary | ICD-10-CM | POA: Diagnosis not present

## 2018-08-07 DIAGNOSIS — R109 Unspecified abdominal pain: Secondary | ICD-10-CM | POA: Insufficient documentation

## 2018-08-07 DIAGNOSIS — E2740 Unspecified adrenocortical insufficiency: Secondary | ICD-10-CM | POA: Diagnosis present

## 2018-08-07 DIAGNOSIS — D631 Anemia in chronic kidney disease: Secondary | ICD-10-CM | POA: Diagnosis present

## 2018-08-07 DIAGNOSIS — N183 Chronic kidney disease, stage 3 (moderate): Secondary | ICD-10-CM | POA: Diagnosis present

## 2018-08-07 DIAGNOSIS — Z79891 Long term (current) use of opiate analgesic: Secondary | ICD-10-CM

## 2018-08-07 DIAGNOSIS — Z5329 Procedure and treatment not carried out because of patient's decision for other reasons: Secondary | ICD-10-CM | POA: Diagnosis present

## 2018-08-07 DIAGNOSIS — R188 Other ascites: Secondary | ICD-10-CM | POA: Diagnosis present

## 2018-08-07 DIAGNOSIS — Z880 Allergy status to penicillin: Secondary | ICD-10-CM

## 2018-08-07 DIAGNOSIS — Z1159 Encounter for screening for other viral diseases: Secondary | ICD-10-CM | POA: Diagnosis not present

## 2018-08-07 DIAGNOSIS — F4310 Post-traumatic stress disorder, unspecified: Secondary | ICD-10-CM | POA: Diagnosis present

## 2018-08-07 DIAGNOSIS — D509 Iron deficiency anemia, unspecified: Secondary | ICD-10-CM | POA: Diagnosis present

## 2018-08-07 DIAGNOSIS — K7460 Unspecified cirrhosis of liver: Secondary | ICD-10-CM | POA: Diagnosis present

## 2018-08-07 LAB — COMPREHENSIVE METABOLIC PANEL
ALT: 7 U/L (ref 0–44)
AST: 9 U/L — ABNORMAL LOW (ref 15–41)
Albumin: 3 g/dL — ABNORMAL LOW (ref 3.5–5.0)
Alkaline Phosphatase: 87 U/L (ref 38–126)
Anion gap: 6 (ref 5–15)
BUN: 29 mg/dL — ABNORMAL HIGH (ref 6–20)
CO2: 24 mmol/L (ref 22–32)
Calcium: 8.8 mg/dL — ABNORMAL LOW (ref 8.9–10.3)
Chloride: 112 mmol/L — ABNORMAL HIGH (ref 98–111)
Creatinine, Ser: 1.98 mg/dL — ABNORMAL HIGH (ref 0.61–1.24)
GFR calc Af Amer: 42 mL/min — ABNORMAL LOW (ref >60)
GFR calc non Af Amer: 36 mL/min — ABNORMAL LOW (ref >60)
Glucose, Bld: 99 mg/dL (ref 70–99)
Potassium: 3.9 mmol/L (ref 3.5–5.1)
Sodium: 142 mmol/L (ref 135–145)
Total Bilirubin: 0.8 mg/dL (ref 0.3–1.2)
Total Protein: 5.8 g/dL — ABNORMAL LOW (ref 6.5–8.1)

## 2018-08-07 LAB — CBC WITH DIFFERENTIAL/PLATELET
Abs Immature Granulocytes: 0.01 10*3/uL (ref 0.00–0.07)
Basophils Absolute: 0 10*3/uL (ref 0.0–0.1)
Basophils Relative: 1 %
Eosinophils Absolute: 0.2 10*3/uL (ref 0.0–0.5)
Eosinophils Relative: 5 %
HCT: 17.5 % — ABNORMAL LOW (ref 39.0–52.0)
Hemoglobin: 4.7 g/dL — CL (ref 13.0–17.0)
Immature Granulocytes: 0 %
Lymphocytes Relative: 20 %
Lymphs Abs: 0.8 10*3/uL (ref 0.7–4.0)
MCH: 20.6 pg — ABNORMAL LOW (ref 26.0–34.0)
MCHC: 26.9 g/dL — ABNORMAL LOW (ref 30.0–36.0)
MCV: 76.8 fL — ABNORMAL LOW (ref 80.0–100.0)
Monocytes Absolute: 0.3 10*3/uL (ref 0.1–1.0)
Monocytes Relative: 8 %
Neutro Abs: 2.6 10*3/uL (ref 1.7–7.7)
Neutrophils Relative %: 66 %
Platelets: 95 10*3/uL — ABNORMAL LOW (ref 150–400)
RBC: 2.28 MIL/uL — ABNORMAL LOW (ref 4.22–5.81)
RDW: 21.3 % — ABNORMAL HIGH (ref 11.5–15.5)
WBC: 3.9 10*3/uL — ABNORMAL LOW (ref 4.0–10.5)
nRBC: 0 % (ref 0.0–0.2)

## 2018-08-07 LAB — IRON AND TIBC
Iron: 15 ug/dL — ABNORMAL LOW (ref 45–182)
Saturation Ratios: 4 % — ABNORMAL LOW (ref 17.9–39.5)
TIBC: 348 ug/dL (ref 250–450)
UIBC: 333 ug/dL

## 2018-08-07 LAB — PROTIME-INR
INR: 1.6 — ABNORMAL HIGH (ref 0.8–1.2)
Prothrombin Time: 18.9 seconds — ABNORMAL HIGH (ref 11.4–15.2)

## 2018-08-07 LAB — APTT: aPTT: 31 seconds (ref 24–36)

## 2018-08-07 LAB — LIPASE, BLOOD: Lipase: 37 U/L (ref 11–51)

## 2018-08-07 LAB — FERRITIN: Ferritin: 3 ng/mL — ABNORMAL LOW (ref 24–336)

## 2018-08-07 LAB — SARS CORONAVIRUS 2 BY RT PCR (HOSPITAL ORDER, PERFORMED IN ~~LOC~~ HOSPITAL LAB): SARS Coronavirus 2: NEGATIVE

## 2018-08-07 LAB — VITAMIN B12: Vitamin B-12: 644 pg/mL (ref 180–914)

## 2018-08-07 LAB — RETICULOCYTES
Immature Retic Fract: 10.9 % (ref 2.3–15.9)
RBC.: 2.28 MIL/uL — ABNORMAL LOW (ref 4.22–5.81)
Retic Count, Absolute: 26.9 10*3/uL (ref 19.0–186.0)
Retic Ct Pct: 1.2 % (ref 0.4–3.1)

## 2018-08-07 LAB — FOLATE: Folate: 6.5 ng/mL (ref >5.9)

## 2018-08-07 LAB — PREPARE RBC (CROSSMATCH)

## 2018-08-07 MED ORDER — SODIUM CHLORIDE 0.9 % IV SOLN
80.0000 mg | Freq: Once | INTRAVENOUS | Status: AC
  Administered 2018-08-07: 80 mg via INTRAVENOUS
  Filled 2018-08-07: qty 80

## 2018-08-07 MED ORDER — OCTREOTIDE LOAD VIA INFUSION
50.0000 ug | Freq: Once | INTRAVENOUS | Status: AC
  Administered 2018-08-07: 11:00:00 50 ug via INTRAVENOUS
  Filled 2018-08-07: qty 25

## 2018-08-07 MED ORDER — CINACALCET HCL 30 MG PO TABS
30.0000 mg | ORAL_TABLET | Freq: Every day | ORAL | Status: DC
  Administered 2018-08-07 – 2018-08-08 (×2): 30 mg via ORAL
  Filled 2018-08-07 (×2): qty 1

## 2018-08-07 MED ORDER — SODIUM CHLORIDE 0.9 % IV SOLN
8.0000 mg/h | INTRAVENOUS | Status: DC
  Administered 2018-08-07 (×2): 8 mg/h via INTRAVENOUS
  Filled 2018-08-07 (×4): qty 80

## 2018-08-07 MED ORDER — OXYCODONE-ACETAMINOPHEN 5-325 MG PO TABS
1.0000 | ORAL_TABLET | Freq: Once | ORAL | Status: AC
  Administered 2018-08-07: 07:00:00 1 via ORAL
  Filled 2018-08-07: qty 1

## 2018-08-07 MED ORDER — TRIHEXYPHENIDYL HCL 2 MG PO TABS
2.0000 mg | ORAL_TABLET | Freq: Three times a day (TID) | ORAL | Status: DC
  Administered 2018-08-07 – 2018-08-08 (×3): 2 mg via ORAL
  Filled 2018-08-07 (×3): qty 1

## 2018-08-07 MED ORDER — PANTOPRAZOLE SODIUM 40 MG IV SOLR: 40.0000 mg | Freq: Two times a day (BID) | INTRAVENOUS | Status: DC

## 2018-08-07 MED ORDER — SODIUM CHLORIDE 0.9 % IV SOLN
510.0000 mg | Freq: Once | INTRAVENOUS | Status: AC
  Administered 2018-08-07: 14:00:00 510 mg via INTRAVENOUS
  Filled 2018-08-07: qty 17

## 2018-08-07 MED ORDER — SODIUM CHLORIDE 0.9 % IV SOLN
INTRAVENOUS | Status: DC
  Administered 2018-08-07: 1000 mL via INTRAVENOUS
  Administered 2018-08-08: 03:00:00 via INTRAVENOUS

## 2018-08-07 MED ORDER — ONDANSETRON 4 MG PO TBDP
4.0000 mg | ORAL_TABLET | Freq: Once | ORAL | Status: AC
  Administered 2018-08-07: 07:00:00 4 mg via ORAL
  Filled 2018-08-07: qty 1

## 2018-08-07 MED ORDER — OXYCODONE HCL 5 MG PO TABS
5.0000 mg | ORAL_TABLET | Freq: Four times a day (QID) | ORAL | Status: DC | PRN
  Administered 2018-08-07 (×2): 5 mg via ORAL
  Filled 2018-08-07 (×2): qty 1

## 2018-08-07 MED ORDER — SODIUM CHLORIDE 0.9% IV SOLUTION
Freq: Once | INTRAVENOUS | Status: AC
  Administered 2018-08-07: 10:00:00 via INTRAVENOUS

## 2018-08-07 MED ORDER — SODIUM CHLORIDE 0.9 % IV SOLN
50.0000 ug/h | INTRAVENOUS | Status: DC
  Administered 2018-08-07 – 2018-08-08 (×3): 50 ug/h via INTRAVENOUS
  Filled 2018-08-07 (×4): qty 1

## 2018-08-07 NOTE — ED Provider Notes (Addendum)
[facility]-EMERGENCY DEPT Provider Note   CSN: [id number] Arrival date & time: 08/07/18  [id number]    History   Chief Complaint Chief Complaint  Patient presents with  . Abdominal Pain    HPI [name] is a 58 y.o. male.     HPI 58 year old male with a history of hepatitis C and associated cirrhosis presents the emergency department with complaints of abdominal pain over the past 3 days without nausea vomiting or diarrhea.  Denies fevers and chills.  Denies melena or hematochezia.  He believes this may be secondary to drinking too many sodas.  He denies use of alcohol.  Reports generalized periumbilical abdominal pain.  Denies back pain and flank pain.  Symptoms are moderate in severity.   Past Medical History:  Diagnosis Date  . Anemia   . Ascites   . Chronic leg pain   . Cirrhosis (HCC)   . GERD (gastroesophageal reflux disease)   . H/O diabetes insipidus   . Hepatitis C    Completed therapy with Epclusa ~ 2018.  suspect [place] [facility] [place]  . History of adrenal insufficiency   . History of ARDS   . History of blood transfusion   . Hypothyroidism   . Kidney disease    stage 3  . Neuropathy   . Overdose 12/2009  . Peripheral neuropathy   . Personality disorder (HCC)   . PTSD (post-traumatic stress disorder)    [place]  . Schizoaffective disorder [facility](HCC)     Patient Active Problem List   Diagnosis Date Noted  . Generalized abdominal pain   . Hematemesis with nausea   . Evaluation by psychiatric service required   . Acute upper gastrointestinal bleeding 10/15/2017  . PTSD (post-traumatic stress disorder)   . Hypothyroidism   . History of adrenal insufficiency   . H/O diabetes insipidus   . Schizoaffective disorder (HCC) 11/08/2011  . Personality disorder (HCC)   . Anemia   . Ascites   . Liver disease   . Hepatitis C   . Kidney disease   . GERD (gastroesophageal reflux  disease)   . Psychosis (HCC) 11/07/2011    Past Surgical History:  Procedure Laterality Date  . CERVICAL SPINE SURGERY  unknown  . RIGHT HIP SURGERY  1996  . US GUIDED LEFT THORACENTESIS  01/21/2010        Home Medications    Prior to Admission medications   Medication Sig Start Date End Date Taking? Authorizing Provider  [employer] D3 2000 units CAPS Take 2,000 Units by mouth daily. 10/23/17   [provider]  diphenhydrAMINE (BENADRYL) 25 MG tablet Take 25 mg by mouth every 6 (six) hours as needed for allergies or sleep.    [provider]  fluPHENAZine (PROLIXIN) 5 MG tablet Take 5 mg by mouth 3 (three) times daily.    [provider]  furosemide (LASIX) 20 MG tablet Take 20 mg by mouth daily.     [provider]  gabapentin (NEURONTIN) 300 MG capsule Take 300 mg by mouth 3 (three) times daily.  10/19/17   [provider]  LORazepam (ATIVAN) 0.5 MG tablet Take 0.5 mg by mouth every 8 (eight) hours as needed for anxiety.     [provider]  ondansetron (ZOFRAN ODT) 8 MG disintegrating tablet Take 1 tablet (8 mg total) by mouth every 8 (eight) hours as needed for nausea or vomiting. Patient not taking: Reported on  11/02/2017 11/14/15   Azalia Bilis, MD  oxyCODONE (OXY IR/ROXICODONE) 5 MG immediate release tablet Take 1 tablet (5 mg total) by mouth every 6 (six) hours as needed for severe pain. 09/08/17   Garlon Hatchet, PA-C  pantoprazole (PROTONIX) 40 MG tablet Take 1 tablet (40 mg total) by mouth 2 (two) times daily. 10/18/17   Simonne Martinet, NP  SENSIPAR 30 MG tablet Take 30 mg by mouth daily. 07/25/17   [provider]  trihexyphenidyl (ARTANE) 2 MG tablet Take 2 mg by mouth 3 (three) times daily.    [provider]    Family History Family History  Problem Relation Age of Onset  . Cancer Mother   . Heart attack Father     Social History Social History   Tobacco Use  . Smoking status: Current Every Day  Smoker    Packs/day: 1.00    Years: 25.00    Pack years: 25.00    Types: Cigarettes  . Smokeless tobacco: Never Used  Substance Use Topics  . Alcohol use: No    Comment: Pt denies   . Drug use: No    Comment: Pt denies     Allergies   Heparin; Imipramine; Lamictal [lamotrigine]; and Penicillins   Review of Systems Review of Systems  All other systems reviewed and are negative.    Physical Exam Updated Vital Signs BP (!) 97/46   Pulse 76   Temp 98.7 F (37.1 C) (Oral)   Resp 15   Ht 6\' 6"  (1.981 m)   Wt 104.3 kg   SpO2 96%   BMI 26.58 kg/m   Physical Exam Vitals signs and nursing note reviewed.  Constitutional:      Appearance: He is well-developed.  HENT:     Head: Normocephalic and atraumatic.  Neck:     Musculoskeletal: Normal range of motion.  Cardiovascular:     Rate and Rhythm: Normal rate and regular rhythm.     Heart sounds: Normal heart sounds.  Pulmonary:     Effort: Pulmonary effort is normal. No respiratory distress.     Breath sounds: Normal breath sounds.  Abdominal:     General: There is no distension.     Palpations: Abdomen is soft.     Tenderness: There is no abdominal tenderness.  Genitourinary:    Comments: Brown stool.  Hemoccult positive Musculoskeletal: Normal range of motion.  Skin:    General: Skin is warm and dry.  Neurological:     Mental Status: He is alert and oriented to person, place, and time.  Psychiatric:        Judgment: Judgment normal.      ED Treatments / Results  Labs (all labs ordered are listed, but only abnormal results are displayed) Labs Reviewed  COMPREHENSIVE METABOLIC PANEL - Abnormal; Notable for the following components:      Result Value   Chloride 112 (*)    BUN 29 (*)    Creatinine, Ser 1.98 (*)    Calcium 8.8 (*)    Total Protein 5.8 (*)    Albumin 3.0 (*)    AST 9 (*)    GFR calc non Af Amer 36 (*)    GFR calc Af Amer 42 (*)    All other components within normal limits  CBC WITH  DIFFERENTIAL/PLATELET - Abnormal; Notable for the following components:   WBC 3.9 (*)    RBC 2.28 (*)    Hemoglobin 4.7 (*)    HCT 17.5 (*)  MCV 76.8 (*)    MCH 20.6 (*)    MCHC 26.9 (*)    RDW 21.3 (*)    Platelets 95 (*)    All other components within normal limits  PROTIME-INR - Abnormal; Notable for the following components:   Prothrombin Time 18.9 (*)    INR 1.6 (*)    All other components within normal limits  RETICULOCYTES - Abnormal; Notable for the following components:   RBC. 2.28 (*)    All other components within normal limits  LIPASE, BLOOD  APTT  URINALYSIS, ROUTINE W REFLEX MICROSCOPIC  URINALYSIS, COMPLETE (UACMP) WITH MICROSCOPIC  VITAMIN B12  FOLATE  IRON AND TIBC  FERRITIN  POC OCCULT BLOOD, ED  TYPE AND SCREEN  PREPARE RBC (CROSSMATCH)   Hemoglobin  Date Value Ref Range Status  08/07/2018 4.7 (LL) 13.0 - 17.0 g/dL Final    Comment:    Reticulocyte Hemoglobin testing may be clinically indicated, consider ordering this additional test [id number] THIS CRITICAL RESULT HAS VERIFIED AND BEEN CALLED TO HALL, C. RN BY NICOLE MCCOY ON 05 11 2020 AT 0742, AND HAS BEEN READ BACK. CRITICAL RESULT VERIFIED   10/18/2017 7.2 (L) 13.0 - 17.0 g/dL Final  84/[id number]/629507/22/2019 6.9 (LL) 13.0 - 17.0 g/dL Final    Comment:    REPEATED TO VERIFY CRITICAL RESULT CALLED TO, READ BACK BY AND VERIFIED WITH: JEFF,STRENK AT 0435 ON 10/17/17 BY A,MOHAMED   10/16/2017 7.8 (L) 13.0 - 17.0 g/dL Final     EKG None  Radiology Ct Abdomen Pelvis Wo Contrast  Result Date: 08/07/2018 CLINICAL DATA:  Abdominal pain, fall 5 days ago. EXAM: CT ABDOMEN AND PELVIS WITHOUT CONTRAST TECHNIQUE: Multidetector CT imaging of the abdomen and pelvis was performed following the standard protocol without IV contrast. COMPARISON:  10/15/2017 FINDINGS: Lower chest: Trace right pleural effusion. Heart is mildly enlarged. No confluent opacities. Esophageal varices present. Hepatobiliary: Small shrunken liver  compatible with cirrhosis. Small layering gallstones within the gallbladder. No visible focal hepatic abnormality. Pancreas: No focal abnormality or ductal dilatation. Spleen: Enlarged with a craniocaudal length of 21 cm. Adrenals/Urinary Tract: Low-density lesion in the midpole of the right kidney measures 2.7 cm, stable, likely cyst although this cannot be characterized without intravenous contrast. Adrenal glands unremarkable. No hydronephrosis. Urinary bladder unremarkable. Stomach/Bowel: Normal appendix. Stomach, large and small bowel grossly unremarkable. Vascular/Lymphatic: Aortic atherosclerosis. No enlarged abdominal or pelvic lymph nodes. Upper abdominal varices. Reproductive: No visible focal abnormality. Other: Moderate free fluid in the abdomen and pelvis.  No free air. Musculoskeletal: Slight compression deformity through the superior endplate at L1, new since prior study. IMPRESSION: Changes of cirrhosis with associated splenomegaly, upper abdominal and esophageal varices, and moderate ascites. Trace right pleural effusion. Cholelithiasis. Aortic atherosclerosis. Slight compression deformity through the superior endplate of L2, new since prior study. Electronically Signed   By: Charlett NoseKevin  Dover M.D.   On: 08/07/2018 08:39    Procedures .Critical Care Performed by: Azalia Bilisampos, Tynika Luddy, MD Authorized by: Azalia Bilisampos, Ethleen Lormand, MD   Critical care provider statement:    Critical care time (minutes):  35   Critical care was time spent personally by me on the following activities:  Discussions with consultants, evaluation of patient's response to treatment, examination of patient, ordering and performing treatments and interventions, ordering and review of laboratory studies, ordering and review of radiographic studies, pulse oximetry, re-evaluation of patient's condition, obtaining history from patient or surrogate and review of old charts     Medications Ordered in ED Medications  0.9 %  sodium chloride  infusion (Manually program via Guardrails IV Fluids) (has no administration in time range)  pantoprazole (PROTONIX) 80 mg in sodium chloride 0.9 % 100 mL IVPB (has no administration in time range)  pantoprazole (PROTONIX) 80 mg in sodium chloride 0.9 % 250 mL (0.32 mg/mL) infusion (has no administration in time range)  pantoprazole (PROTONIX) injection 40 mg (has no administration in time range)  oxyCODONE-acetaminophen (PERCOCET/ROXICET) 5-325 MG per tablet 1 tablet (1 tablet Oral Given 08/07/18 0657)  ondansetron (ZOFRAN-ODT) disintegrating tablet 4 mg (4 mg Oral Given 08/07/18 0657)     Initial Impression / Assessment and Plan / ED Course  I have reviewed the triage vital signs and the nursing notes.  Pertinent labs & imaging results that were available during my care of the patient were reviewed by me and considered in my medical decision making (see chart for details).       Nonspecific abdominal pain.  Reducible periumbilical hernia.  CT scan demonstrates signs of cirrhosis and esophageal varices without any other acute intra-abdominal pathology.  Hemoglobin returned at just greater than 4.  This is a drop in his hemoglobin.  He has decompensated cirrhosis secondary to hepatitis C.  He denies alcohol use.  He will require admission and blood transfusion.  This likely is a slow GI bleed given his brown stool on examination.  Blood pressure soft likely secondary to his cirrhosis.  INR 1.6.  Has been seen and evaluated by [facility] gastroneurology before in the past but never had endoscopy as the patient refused during his hospitalization in July 2019.  He has not seen gastroenterology in the outpatient setting as best I can tell  PPI drip now.  Given his history of esophageal varices will initiate octreotide until stability determined  Admit to [facility]  GI: I spoke to Dr Ewing Schlein who will see the patient in consultation  Final Clinical Impressions(s) / ED Diagnoses   Final  diagnoses:  Acute abdominal pain  Gastrointestinal hemorrhage, unspecified gastrointestinal hemorrhage type  Hepatitis C virus infection without hepatic coma, unspecified chronicity  Cirrhosis of liver with ascites, unspecified hepatic cirrhosis type [facility])    ED Discharge Orders    None       Azalia Bilis, MD 08/07/18 4158    Azalia Bilis, MD 08/07/18 [phone]

## 2018-08-07 NOTE — ED Notes (Addendum)
Date and time results received: 08/07/18 7:42 AM  (use smartphrase ".now" to insert current time)  Test: hgb Critical Value: 4.7  Name of Provider Notified: Morrie Sheldon RN and Whitney Post RN Primary RN   Orders Received? Or Actions Taken?: Dr Patria Mane made aware

## 2018-08-07 NOTE — ED Provider Notes (Signed)
MSE was initiated and I personally evaluated the patient and placed orders (if any) at  6:44 AM on Aug 07, 2018.  The patient appears stable so that the remainder of the MSE may be completed by another provider.  Patient has a complex medical history including liver cirrhosis.  He is complaining of abdominal pain.  He reports that the pain started early in the morning after he had a soda.  Pain is periumbilical in nature.  He denies any UTI-like symptoms, vomiting, fevers, chills.  Patient also is seen by pain management for his pain symptoms.  Appropriate labs have been ordered.  Abdominal exam did not reveal any peritoneal findings, no imaging ordered at this time.  He is requesting 5 mg of oxycodone which has been ordered along with nausea medicine.   Derwood Kaplan, MD 08/07/18 [phone]

## 2018-08-07 NOTE — ED Notes (Signed)
Date and time results received: 08/07/18 0744 (use smartphrase ".now" to insert current time)  Test: HGB Critical Value: 4.7  Name of Provider Notified: Patria Mane  Orders Received? Or Actions Taken?: awaiting orders

## 2018-08-07 NOTE — ED Notes (Signed)
EDP at bedside  

## 2018-08-07 NOTE — Plan of Care (Signed)

## 2018-08-07 NOTE — ED Notes (Signed)
hemocult card was done by MD, MD is aware of result

## 2018-08-07 NOTE — Consult Note (Signed)
Reason for Consult: Cirrhosis anemia Referring Physician: Hospital team  [name] is an 58 y.o. male.  HPI: Patient unassigned and denies seeing any blood and has chronic arthritis on pain medicine but denies any aspirin or nonsteroidals and has had no nausea vomiting or other GI complaints and has not seen any black stools and does not take iron because it hurts his stomach and supposedly was treated for hepatitis C and wants to eat and go home and his hospital computer chart was reviewed and his case discussed with the ER physician as well  Past Medical History:  Diagnosis Date  . Anemia   . Ascites   . Chronic leg pain   . Cirrhosis (HCC)   . GERD (gastroesophageal reflux disease)   . H/O diabetes insipidus   . Hepatitis C    Completed therapy with Epclusa ~ 2018.  suspect therapy overseen by [facility] in [place], [place]  . History of adrenal insufficiency   . History of ARDS   . History of blood transfusion   . Hypothyroidism   . Kidney disease    stage 3  . Neuropathy   . Overdose 12/2009  . Peripheral neuropathy   . Personality disorder (HCC)   . PTSD (post-traumatic stress disorder)    SECONDARY TO WAR IN [place]  . Schizoaffective disorder [facility])     Past Surgical History:  Procedure Laterality Date  . CERVICAL SPINE SURGERY  unknown  . RIGHT HIP SURGERY  1996  . US GUIDED LEFT THORACENTESIS  01/21/2010    Family History  Problem Relation Age of Onset  . Cancer Mother   . Heart attack Father     Social History:  reports that he has been smoking cigarettes. He has a 25.00 pack-year smoking history. He has never used smokeless tobacco. He reports that he does not drink alcohol or use drugs.  Allergies:  Allergies  Allergen Reactions  . Heparin Other (See Comments)    unknown  . Imipramine Other (See Comments)    unknown  . Lamictal [Lamotrigine] Other (See Comments)    unknown  . Penicillins Other (See Comments)    Pt can not remember  allergy and is unable to review questions DID THE REACTION INVOLVE: Swelling of the face/tongue/throat, SOB, or low BP? UNK Sudden or severe rash/hives, skin peeling, or the inside of the mouth or nose? UNK Did it require medical treatment? UNK When did it last happen?can't remember If all above answers are "NO", may proceed with cephalosporin use.     Medications: I have reviewed the patient's current medications.  Results for orders placed or performed during the hospital encounter of 08/07/18 (from the past 48 hour(s))  Comprehensive metabolic panel     Status: Abnormal   Collection Time: 08/07/18  7:18 AM  Result Value Ref Range   Sodium 142 135 - 145 mmol/L   Potassium 3.9 3.5 - 5.1 mmol/L   Chloride 112 (H) 98 - 111 mmol/L   CO2 24 22 - 32 mmol/L   Glucose, Bld 99 70 - 99 mg/dL   BUN 29 (H) 6 - 20 mg/dL   Creatinine, Ser 4.09 (H) 0.61 - 1.24 mg/dL   Calcium 8.8 (L) 8.9 - 10.3 mg/dL   Total Protein 5.8 (L) 6.5 - 8.1 g/dL   Albumin 3.0 (L) 3.5 - 5.0 g/dL   AST 9 (L) 15 - 41 U/L   ALT 7 0 - 44 U/L   Alkaline Phosphatase 87 38 -  126 U/L   Total Bilirubin 0.8 0.3 - 1.2 mg/dL   GFR calc non Af Amer 36 (L) >60 mL/min   GFR calc Af Amer 42 (L) >60 mL/min   Anion gap 6 5 - 15    Comment: Performed at [facility], [address] [place], [place] [address]  Lipase, blood     Status: None   Collection Time: 08/07/18  7:18 AM  Result Value Ref Range   Lipase 37 11 - 51 U/L    Comment: Performed at [facility], [address], [place], [place] [address]  CBC with Diff     Status: Abnormal   Collection Time: 08/07/18  7:18 AM  Result Value Ref Range   WBC 3.9 (L) 4.0 - 10.5 K/uL   RBC 2.28 (L) 4.22 - 5.81 MIL/uL   Hemoglobin 4.7 (LL) 13.0 - 17.0 g/dL    Comment: Reticulocyte Hemoglobin testing may be clinically indicated, consider ordering this additional test [id number] THIS CRITICAL RESULT HAS VERIFIED AND BEEN CALLED TO HALL, C.  RN BY NICOLE MCCOY ON 05 11 2020 AT 0742, AND HAS BEEN READ BACK. CRITICAL RESULT VERIFIED    HCT 17.5 (L) 39.0 - 52.0 %   MCV 76.8 (L) 80.0 - 100.0 fL   MCH 20.6 (L) 26.0 - 34.0 pg   MCHC 26.9 (L) 30.0 - 36.0 g/dL   RDW 13.0 (H) 86.5 - 78.4 %   Platelets 95 (L) 150 - 400 K/uL    Comment: REPEATED TO VERIFY PLATELET COUNT CONFIRMED BY SMEAR SPECIMEN CHECKED FOR CLOTS Immature Platelet Fraction may be clinically indicated, consider ordering this additional test [id number]    nRBC 0.0 0.0 - 0.2 %   Neutrophils Relative % 66 %   Neutro Abs 2.6 1.7 - 7.7 K/uL   Lymphocytes Relative 20 %   Lymphs Abs 0.8 0.7 - 4.0 K/uL   Monocytes Relative 8 %   Monocytes Absolute 0.3 0.1 - 1.0 K/uL   Eosinophils Relative 5 %   Eosinophils Absolute 0.2 0.0 - 0.5 K/uL   Basophils Relative 1 %   Basophils Absolute 0.0 0.0 - 0.1 K/uL   Immature Granulocytes 0 %   Abs Immature Granulocytes 0.01 0.00 - 0.07 K/uL   Polychromasia PRESENT    Target Cells PRESENT     Comment: Performed at [facility], [address] [place], [place] [address]  Protime-INR     Status: Abnormal   Collection Time: 08/07/18  7:18 AM  Result Value Ref Range   Prothrombin Time 18.9 (H) 11.4 - 15.2 seconds   INR 1.6 (H) 0.8 - 1.2    Comment: (NOTE) INR goal varies based on device and disease states. Performed at [facility], [address], [place], [place] [address]   APTT     Status: None   Collection Time: 08/07/18  7:18 AM  Result Value Ref Range   aPTT 31 24 - 36 seconds    Comment: Performed at [facility], [address], [place], [place] [address]  Reticulocytes     Status: Abnormal   Collection Time: 08/07/18  7:18 AM  Result Value Ref Range   Retic Ct Pct 1.2 0.4 - 3.1 %   RBC. 2.28 (L) 4.22 - 5.81 MIL/uL   Retic Count, Absolute 26.9 19.0 - 186.0 K/uL   Immature Retic Fract 10.9 2.3 - 15.9 %    Comment: Performed at [facility],  [address] [place], [place] [address]  Type and screen [facility]     Status: None (Preliminary result)   Collection Time: 08/07/18  8:00 AM  Result Value Ref Range   ABO/RH(D) A POS    Antibody Screen NEG    Sample Expiration 08/10/2018,2359    Unit Number [id number]    Blood Component Type RED CELLS,LR    Unit division 00    Status of Unit ISSUED    Transfusion Status OK TO TRANSFUSE    Crossmatch Result      Compatible Performed at [facility], [address] [place], [place] [address]    Unit Number [id number]    Blood Component Type RED CELLS,LR    Unit division 00    Status of Unit ALLOCATED    Transfusion Status OK TO TRANSFUSE    Crossmatch Result Compatible   Vitamin B12     Status: None   Collection Time: 08/07/18  8:00 AM  Result Value Ref Range   Vitamin B-12 644 180 - 914 pg/mL    Comment: (NOTE) This assay is not validated for testing neonatal or myeloproliferative syndrome specimens for Vitamin B12 levels. Performed at [facility], [address] [place], [place] [address]   Folate     Status: None   Collection Time: 08/07/18  8:00 AM  Result Value Ref Range   Folate 6.5 >5.9 ng/mL    Comment: Performed at [facility], [address] [place], [place] [address]  Iron and TIBC     Status: Abnormal   Collection Time: 08/07/18  8:00 AM  Result Value Ref Range   Iron 15 (L) 45 - 182 ug/dL   TIBC 387348 564250 - 332450 ug/dL   Saturation Ratios 4 (L) 17.9 - 39.5 %   UIBC 333 ug/dL    Comment: Performed at [facility], [address] [place], [place] [address]  Ferritin     Status: Abnormal   Collection Time: 08/07/18  8:00 AM  Result Value Ref Range   Ferritin 3 (L) 24 - 336 ng/mL    Comment: Performed at [facility], [address] [place], [place] [address]  Prepare RBC     Status: None   Collection Time: 08/07/18  9:00 AM   Result Value Ref Range   Order Confirmation      ORDER PROCESSED BY BLOOD BANK Performed at [facility], [address] [place], [place] [address]   SARS Coronavirus 2 (CEPHEID - Performed in [facility] hospital lab), Hosp Order     Status: None   Collection Time: 08/07/18 10:21 AM  Result Value Ref Range   SARS Coronavirus 2 NEGATIVE NEGATIVE    Comment: (NOTE) If result is NEGATIVE SARS-CoV-2 target nucleic acids are NOT DETECTED. The SARS-CoV-2 RNA is generally detectable in upper and lower  respiratory specimens during the acute phase of infection. The lowest  concentration of SARS-CoV-2 viral copies this assay can detect is 250  copies / mL. A negative result does not preclude SARS-CoV-2 infection  and should not be used as the sole basis for treatment or other  patient management decisions.  A negative result may occur with  improper specimen collection / handling, submission of specimen other  than nasopharyngeal swab, presence of viral mutation(s) within the  areas targeted by this assay, and inadequate number of viral copies  (<250 copies / mL). A negative result must be combined  with clinical  observations, patient history, and epidemiological information. If result is POSITIVE SARS-CoV-2 target nucleic acids are DETECTED. The SARS-CoV-2 RNA is generally detectable in upper and lower  respiratory specimens dur ing the acute phase of infection.  Positive  results are indicative of active infection with SARS-CoV-2.  Clinical  correlation with patient history and other diagnostic information is  necessary to determine patient infection status.  Positive results do  not rule out bacterial infection or co-infection with other viruses. If result is PRESUMPTIVE POSTIVE SARS-CoV-2 nucleic acids MAY BE PRESENT.   A presumptive positive result was obtained on the submitted specimen  and confirmed on repeat testing.  While 2019 novel coronavirus   (SARS-CoV-2) nucleic acids may be present in the submitted sample  additional confirmatory testing may be necessary for epidemiological  and / or clinical management purposes  to differentiate between  SARS-CoV-2 and other Sarbecovirus currently known to infect humans.  If clinically indicated additional testing with an alternate test  methodology [phone]) is advised. The SARS-CoV-2 RNA is generally  detectable in upper and lower respiratory sp ecimens during the acute  phase of infection. The expected result is Negative. Fact Sheet for Patients:  BoilerBrush.com.cy Fact Sheet for Healthcare Providers: https://pope.com/ This test is not yet approved or cleared by the [place] FDA and has been authorized for detection and/or diagnosis of SARS-CoV-2 by FDA under an Emergency Use Authorization (EUA).  This EUA will remain in effect (meaning this test can be used) for the duration of the COVID-19 declaration under Section 564(b)(1) of the Act, 21 U.S.C. section 360bbb-3(b)(1), unless the authorization is terminated or revoked sooner. Performed at [facility], [address], [place], [place] [address]     Ct Abdomen Pelvis Wo Contrast  Result Date: 08/07/2018 CLINICAL DATA:  Abdominal pain, fall 5 days ago. EXAM: CT ABDOMEN AND PELVIS WITHOUT CONTRAST TECHNIQUE: Multidetector CT imaging of the abdomen and pelvis was performed following the standard protocol without IV contrast. COMPARISON:  10/15/2017 FINDINGS: Lower chest: Trace right pleural effusion. Heart is mildly enlarged. No confluent opacities. Esophageal varices present. Hepatobiliary: Small shrunken liver compatible with cirrhosis. Small layering gallstones within the gallbladder. No visible focal hepatic abnormality. Pancreas: No focal abnormality or ductal dilatation. Spleen: Enlarged with a craniocaudal length of 21 cm. Adrenals/Urinary Tract: Low-density  lesion in the midpole of the right kidney measures 2.7 cm, stable, likely cyst although this cannot be characterized without intravenous contrast. Adrenal glands unremarkable. No hydronephrosis. Urinary bladder unremarkable. Stomach/Bowel: Normal appendix. Stomach, large and small bowel grossly unremarkable. Vascular/Lymphatic: Aortic atherosclerosis. No enlarged abdominal or pelvic lymph nodes. Upper abdominal varices. Reproductive: No visible focal abnormality. Other: Moderate free fluid in the abdomen and pelvis.  No free air. Musculoskeletal: Slight compression deformity through the superior endplate at L1, new since prior study. IMPRESSION: Changes of cirrhosis with associated splenomegaly, upper abdominal and esophageal varices, and moderate ascites. Trace right pleural effusion. Cholelithiasis. Aortic atherosclerosis. Slight compression deformity through the superior endplate of L2, new since prior study. Electronically Signed   By: Charlett Nose M.D.   On: 08/07/2018 08:39    ROS negative except above Blood pressure (!) 102/49, pulse 69, temperature 98.1 F (36.7 C), resp. rate 18, height 6\' 6"  (1.981 m), weight 104.3 kg, SpO2 95 %. Physical Exam vital signs stable afebrile no acute distress patient lying comfortably in the bed abdomen soft nontender probable some ascites CT and labs reviewed  Assessment/Plan: Anemia iron deficiency in patient with cirrhosis  and history of hepatitis C Plan: I discussed endoscopy with the patient but he is refusing and will allow clear liquids and call me back if I can be of any further assistance with this hospital stay or if you can convince him to have an endoscopy otherwise we will repeat hepatitis C antibody and if positive mRNA quantitative and consider infectious disease consult if still positive to evaluate for therapy otherwise consider a trial of beta-blockers or periodic iron infusions and avoid aspirin and nonsteroidals at home was discussed with the  patient and if no signs of bleeding in a day or 2 may slowly advance diet  Jamekia Gannett E 08/07/2018, 12:54 PM

## 2018-08-07 NOTE — ED Notes (Signed)
Bed: QM57 Expected date:  Expected time:  Means of arrival:  Comments: 63M abdominal pain

## 2018-08-07 NOTE — ED Notes (Signed)
POC occult blood placed at bedside for collection

## 2018-08-07 NOTE — H&P (Addendum)
History and Physical    [name] DOA: 08/07/2018  PCP: Fleet ContrasAvbuere, Edwin, MD Patient coming from: home  Chief Complaint: abdominal pain  HPI: [name] is a 58 y.o. male with medical history significant of hep c cirrhosis adrenal insuff ckd stage 3 ptsd hypothyroidsm Schizoaffective d/o admitted with abdominal pain.  When I saw him his abdominal pain had been resolved after getting an oxycodone in the ER he denied any nausea vomiting hematemesis hemoptysis or hematochezia.  He is only question was when I can go home.  Cannot go home.  Denied any fever chills cough chest pain shortness of breath he denied any urinary complaints.  He lives at home alone he has no family members.  He thinks he has lost a little bit of weight but not sure how much.  His appetite seems to be okay.  He denies taking any NSAIDs or aspirin.  He denies use of alcohol however he reports he smokes.  He denies taking any other blood thinners.    ED Course: started octreotide protonix ivf.soft bp not tachy afeb on room air.hb 4.7.  Blood pressure 103 58 pulse 67 respiration 19 saturation 99% on room air.  His sodium was 142 potassium 3.9 creatinine 1.98 lipase 37 AST 9 ALT 7 iron 15 ferritin 3 folate 6.5 TIBC 348 hemoglobin 4.7 platelet count 95 hematocrit 17.5 white count is 3.9.  Review of Systems: As per HPI otherwise all other systems reviewed and are negative  Ambulatory Status: He is ambulatory at baseline.  Past Medical History:  Diagnosis Date  . Anemia   . Ascites   . Chronic leg pain   . Cirrhosis (HCC)   . GERD (gastroesophageal reflux disease)   . H/O diabetes insipidus   . Hepatitis C    Completed therapy with Epclusa ~ 2018.  suspect therapy [place] [facility] [place]  . History of adrenal insufficiency   . History of ARDS   . History of blood transfusion   . Hypothyroidism   . Kidney disease    stage 3  . Neuropathy   . Overdose  12/2009  . Peripheral neuropathy   . Personality disorder (HCC)   . PTSD (post-traumatic stress disorder)    [place]  . Schizoaffective disorder [facility](HCC)     Past Surgical History:  Procedure Laterality Date  . CERVICAL SPINE SURGERY  unknown  . RIGHT HIP SURGERY  1996  . US GUIDED LEFT THORACENTESIS  01/21/2010    Social History   Socioeconomic History  . Marital status: Single    Spouse name: Not on file  . Number of children: 0  . Years of education: 2912  . Highest education level: Not on file  Occupational History    Comment: umemployed  Social Needs  . Financial resource strain: Not on file  . Food insecurity:    Worry: Not on file    Inability: Not on file  . Transportation needs:    Medical: Not on file    Non-medical: Not on file  Tobacco Use  . Smoking status: Current Every Day Smoker    Packs/day: 1.00    Years: 25.00    Pack years: 25.00    Types: Cigarettes  . Smokeless tobacco: Never Used  Substance and Sexual Activity  . Alcohol use: No    Comment: Pt denies   . Drug use: No    Comment: Pt denies  . Sexual activity:  Never  Lifestyle  . Physical activity:    Days per week: Not on file    Minutes per session: Not on file  . Stress: Not on file  Relationships  . Social connections:    Talks on phone: Not on file    Gets together: Not on file    Attends religious service: Not on file    Active member of club or organization: Not on file    Attends meetings of clubs or organizations: Not on file    Relationship status: Not on file  . Intimate partner violence:    Fear of current or ex partner: Not on file    Emotionally abused: Not on file    Physically abused: Not on file    Forced sexual activity: Not on file  Other Topics Concern  . Not on file  Social History Narrative   11/02/17 Lives with mother    Allergies  Allergen Reactions  . Heparin Other (See Comments)    unknown  . Imipramine Other (See Comments)    unknown   . Lamictal [Lamotrigine] Other (See Comments)    unknown  . Penicillins Other (See Comments)    Pt can not remember allergy and is unable to review questions DID THE REACTION INVOLVE: Swelling of the face/tongue/throat, SOB, or low BP? UNK Sudden or severe rash/hives, skin peeling, or the inside of the mouth or nose? UNK Did it require medical treatment? UNK When did it last happen?can't remember If all above answers are "NO", may proceed with cephalosporin use.     Family History  Problem Relation Age of Onset  . Cancer Mother   . Heart attack Father       Prior to Admission medications   Medication Sig Start Date End Date Taking? Authorizing Provider  [facility] D3 2000 units CAPS Take 2,000 Units by mouth daily. 10/23/17  Yes [provider]  diphenhydrAMINE (BENADRYL) 25 MG tablet Take 25 mg by mouth at bedtime.    Yes [provider]  famotidine (PEPCID) 20 MG tablet Take 20 mg by mouth at bedtime. 07/12/18  Yes [provider]  fluPHENAZine (PROLIXIN) 5 MG tablet Take 5 mg by mouth 3 (three) times daily.   Yes [provider]  furosemide (LASIX) 20 MG tablet Take 20 mg by mouth daily.    Yes [provider]  gabapentin (NEURONTIN) 100 MG capsule Take 200 mg by mouth 3 (three) times daily. 07/21/18  Yes [provider]  LORazepam (ATIVAN) 0.5 MG tablet Take 0.5 mg by mouth every 8 (eight) hours as needed for anxiety.    Yes [provider]  meloxicam (MOBIC) 15 MG tablet Take 15 mg by mouth daily. 07/06/18  Yes [provider]  oxyCODONE (OXY IR/ROXICODONE) 5 MG immediate release tablet Take 1 tablet (5 mg total) by mouth every 6 (six) hours as needed for severe pain. 09/08/17  Yes Garlon Hatchet, PA-C  pantoprazole (PROTONIX) 40 MG tablet Take 1 tablet (40 mg total) by mouth 2 (two) times daily. 10/18/17  Yes Simonne Martinet, NP  SENSIPAR 30 MG tablet Take 30 mg by mouth daily. 07/25/17  Yes [provider]  trihexyphenidyl (ARTANE) 2 MG tablet Take 2 mg by mouth 3 (three) times daily.   Yes [provider]  ondansetron (ZOFRAN ODT) 8 MG disintegrating tablet Take 1 tablet (8 mg total) by mouth every 8 (eight) hours as needed for nausea or vomiting. Patient not taking: Reported on 11/02/2017  11/14/15   Azalia Bilisampos, Kevin, MD    Physical Exam: Vitals:   08/07/18 0629 08/07/18 0700 08/07/18 0735 08/07/18 0800  BP: (!) 97/47 (!) 98/48 (!) 92/47 (!) 97/46  Pulse: 70 71 71 76  Resp:  17  15  Temp:      TempSrc:      SpO2: 100% 100% 95% 96%  Weight:      Height:         . General: Appears calm and comfortable . Eyes:  PERRL, EOMI, normal lids, iris . ENT: grossly normal hearing, lips & tongue, mmm . Neck: no LAD, masses or thyromegaly . Cardiovascular:  RRR, no m/r/g. No LE edema.  Marland Kitchen. Respiratory: CTA bilaterally, no w/r/r. Normal respiratory effort. . Abdomen: soft, distended nontender good bowel sounds  . skin:  no rash or induration seen on limited exam . Musculoskeletal trace lower extremity edema . Psychiatric: grossly normal mood and affect, speech fluent and appropriate, AOx3 . Neurologic: CN 2-12 grossly intact, moves all extremities in coordinated fashion, sensation intact  Labs on Admission: I have personally reviewed following labs and imaging studies  CBC: Recent Labs  Lab 08/07/18 0718  WBC 3.9*  NEUTROABS 2.6  HGB 4.7*  HCT 17.5*  MCV 76.8*  PLT 95*   Basic Metabolic Panel: Recent Labs  Lab 08/07/18 0718  NA 142  K 3.9  CL 112*  CO2 24  GLUCOSE 99  BUN 29*  CREATININE 1.98*  CALCIUM 8.8*   GFR: Estimated Creatinine Clearance: 52.6 mL/min (A) (by C-G formula based on SCr of 1.98 mg/dL (H)). Liver Function Tests: Recent Labs  Lab 08/07/18 0718  AST 9*  ALT 7  ALKPHOS 87  BILITOT 0.8  PROT 5.8*  ALBUMIN 3.0*   Recent Labs  Lab 08/07/18 0718  LIPASE 37   No results for input(s): AMMONIA in the last 168 hours. Coagulation Profile:  Recent Labs  Lab 08/07/18 0718  INR 1.6*   Cardiac Enzymes: No results for input(s): CKTOTAL, CKMB, CKMBINDEX, TROPONINI in the last 168 hours. BNP (last 3 results) No results for input(s): PROBNP in the last 8760 hours. HbA1C: No results for input(s): HGBA1C in the last 72 hours. CBG: No results for input(s): GLUCAP in the last 168 hours. Lipid Profile: No results for input(s): CHOL, HDL, LDLCALC, TRIG, CHOLHDL, LDLDIRECT in the last 72 hours. Thyroid Function Tests: No results for input(s): TSH, T4TOTAL, FREET4, T3FREE, THYROIDAB in the last 72 hours. Anemia Panel: Recent Labs    08/07/18 0718 08/07/18 0800  VITAMINB12  --  644  FOLATE  --  6.5  FERRITIN  --  3*  TIBC  --  348  IRON  --  15*  RETICCTPCT 1.2  --    Urine analysis:    Component Value Date/Time   COLORURINE YELLOW 01/19/2013 0015   APPEARANCEUR CLEAR 01/19/2013 0015   LABSPEC 1.021 01/19/2013 0015   PHURINE 5.0 01/19/2013 0015   GLUCOSEU NEGATIVE 01/19/2013 0015   HGBUR NEGATIVE 01/19/2013 0015   BILIRUBINUR NEGATIVE 01/19/2013 0015   KETONESUR NEGATIVE 01/19/2013 0015   PROTEINUR NEGATIVE 01/19/2013 0015   UROBILINOGEN 1.0 01/19/2013 0015   NITRITE NEGATIVE 01/19/2013 0015   LEUKOCYTESUR NEGATIVE 01/19/2013 0015    Creatinine Clearance: Estimated Creatinine Clearance: 52.6 mL/min (A) (by C-G formula based on SCr of 1.98 mg/dL (H)).  Sepsis Labs: @LABRCNTIP (procalcitonin:4,lacticidven:4) )No results found for this or any previous visit (from the past 240 hour(s)).   Radiological Exams on Admission: Ct Abdomen Pelvis Wo Contrast  Result Date: 08/07/2018 CLINICAL DATA:  Abdominal pain, fall 5 days ago. EXAM: CT ABDOMEN AND PELVIS WITHOUT CONTRAST TECHNIQUE: Multidetector CT imaging of the abdomen and pelvis was performed following the standard protocol without IV contrast. COMPARISON:  10/15/2017 FINDINGS: Lower chest: Trace right pleural effusion. Heart is mildly enlarged. No confluent  opacities. Esophageal varices present. Hepatobiliary: Small shrunken liver compatible with cirrhosis. Small layering gallstones within the gallbladder. No visible focal hepatic abnormality. Pancreas: No focal abnormality or ductal dilatation. Spleen: Enlarged with a craniocaudal length of 21 cm. Adrenals/Urinary Tract: Low-density lesion in the midpole of the right kidney measures 2.7 cm, stable, likely cyst although this cannot be characterized without intravenous contrast. Adrenal glands unremarkable. No hydronephrosis. Urinary bladder unremarkable. Stomach/Bowel: Normal appendix. Stomach, large and small bowel grossly unremarkable. Vascular/Lymphatic: Aortic atherosclerosis. No enlarged abdominal or pelvic lymph nodes. Upper abdominal varices. Reproductive: No visible focal abnormality. Other: Moderate free fluid in the abdomen and pelvis.  No free air. Musculoskeletal: Slight compression deformity through the superior endplate at L1, new since prior study. IMPRESSION: Changes of cirrhosis with associated splenomegaly, upper abdominal and esophageal varices, and moderate ascites. Trace right pleural effusion. Cholelithiasis. Aortic atherosclerosis. Slight compression deformity through the superior endplate of L2, new since prior study. Electronically Signed   By: Charlett Nose M.D.   On: 08/07/2018 08:39     Assessment/Plan Active Problems:   * No active hospital problems. *   #1 UGI BLEED most likely - iron deficiency -patient admitted with complaints of abdominal pain and generalized weakness.  Patient probably has chronic slow GI bleed.  CT scan shows esophageal varices.  Labs today show iron deficiency with low iron and ferritin.  CT of the abdomen and pelvis shows changes of cirrhosis with splenomegaly upper abdominal and esophageal varices and moderate ascites trace right pleural effusion cholelithiasis compression deformity through superior endplate of L2.  Blood transfusion has been ordered in the  ER I will order a dose of Feraheme.  ED physician has spoke with [facility].  Patient was admitted last time with the same issues and he refused endoscopy.  It is FOBT positive in the ER with brown stools.  Hold meloxicam.  #2 CKD stage III creatinine at baseline.  #3 cirrhosis of the liver with hepatitis C await GI input.  He takes Lasix 20 mg daily which I am holding till GI work-up was done and right now he is more dry even though he has ascites.  #4 schizophrenia schizoaffective disorder- he takes fluphenazine Ativan.  Restart his medications after GI work-up.  Continue  Artane.  #5 hypothyroidism continue Synthroid  Estimated body mass index is 26.58 kg/m as calculated from the following:   Height as of this encounter: 6\' 6"  (1.981 m).   Weight as of this encounter: 104.3 kg.   DVT prophylaxis: scd Code Status: full Family Communication:  Disposition Plan: pending clinical improvement  Consults called: edp called [facility] Admission status: inpatient   Alwyn Ren MD [facility]  If 7PM-7AM, please contact night-coverage [email or web address] Password [facility]  08/07/2018, 9:31 AM

## 2018-08-07 NOTE — ED Notes (Signed)
ED TO INPATIENT HANDOFF REPORT  Name/Age/Gender [name] 58 y.o. male  Code Status    Code Status Orders  (From admission, onward)         Start     Ordered   08/07/18 0934  Full code  Continuous     08/07/18 0935        Code Status History    Date Active Date Inactive Code Status Order ID Comments User Context   10/15/2017 0943 10/18/2017 1813 Full Code [id number]  Lynnell Catalan, MD ED   01/17/2013 1534 01/23/2013 1730 Full Code [id number]  Trevor Mace, PA-C ED   12/30/2011 0256 12/31/2011 1312 Full Code [id number]  Liliane Bade, RN Inpatient   12/24/2011 1816 12/30/2011 0256 Full Code [id number]  Jones Skene, MD ED   12/23/2011 1522 12/24/2011 1107 Full Code [id number]  Raeford Razor, MD ED   11/08/2011 0008 11/08/2011 2324 Full Code [id number]  Ward Givens, MD ED      Home/SNF/Other Home   Chief Complaint abd pain   Level of Care/Admitting Diagnosis ED Disposition    ED Disposition Condition Comment   Admit  Hospital Area: [facility] [[id number]]  Level of Care: Telemetry [5]  Admit to tele based on following criteria: Other see comments  Comments: gi bleed  Covid Evaluation: N/A  Diagnosis: GI bleed [[id number]]  Admitting Physician: Alwyn Ren [[id number]]  Attending Physician: Alwyn Ren [[id number]]  Estimated length of stay: 3 - 4 days  Certification:: I certify this patient will need inpatient services for at least 2 midnights  PT Class (Do Not Modify): Inpatient [101]  PT Acc Code (Do Not Modify): Private [1]       Medical History Past Medical History:  Diagnosis Date  . Anemia   . Ascites   . Chronic leg pain   . Cirrhosis (HCC)   . GERD (gastroesophageal reflux disease)   . H/O diabetes insipidus   . Hepatitis C    Completed therapy with Epclusa ~ 2018.  suspect therapy overseen by [facility] in [place], [place]  . History of adrenal insufficiency   . History of ARDS   . History of blood transfusion    . Hypothyroidism   . Kidney disease    stage 3  . Neuropathy   . Overdose 12/2009  . Peripheral neuropathy   . Personality disorder (HCC)   . PTSD (post-traumatic stress disorder)    SECONDARY TO WAR IN [place]  . Schizoaffective disorder (HCC)     Allergies Allergies  Allergen Reactions  . Heparin Other (See Comments)    unknown  . Imipramine Other (See Comments)    unknown  . Lamictal [Lamotrigine] Other (See Comments)    unknown  . Penicillins Other (See Comments)    Pt can not remember allergy and is unable to review questions DID THE REACTION INVOLVE: Swelling of the face/tongue/throat, SOB, or low BP? UNK Sudden or severe rash/hives, skin peeling, or the inside of the mouth or nose? UNK Did it require medical treatment? UNK When did it last happen?can't remember If all above answers are "NO", may proceed with cephalosporin use.     IV Location/Drains/Wounds Patient Lines/Drains/Airways Status   Active Line/Drains/Airways    Name:   Placement date:   Placement time:   Site:   Days:   Peripheral IV 08/07/18 Left Antecubital   08/07/18    0700    Antecubital   less than 1   Peripheral  IV 08/07/18 Right Arm   08/07/18    1016    Arm   less than 1   Peripheral IV 08/07/18 Left Forearm   08/07/18    1041    Forearm   less than 1          Labs/Imaging Results for orders placed or performed during the hospital encounter of 08/07/18 (from the past 48 hour(s))  Comprehensive metabolic panel     Status: Abnormal   Collection Time: 08/07/18  7:18 AM  Result Value Ref Range   Sodium 142 135 - 145 mmol/L   Potassium 3.9 3.5 - 5.1 mmol/L   Chloride 112 (H) 98 - 111 mmol/L   CO2 24 22 - 32 mmol/L   Glucose, Bld 99 70 - 99 mg/dL   BUN 29 (H) 6 - 20 mg/dL   Creatinine, Ser 1.61 (H) 0.61 - 1.24 mg/dL   Calcium 8.8 (L) 8.9 - 10.3 mg/dL   Total Protein 5.8 (L) 6.5 - 8.1 g/dL   Albumin 3.0 (L) 3.5 - 5.0 g/dL   AST 9 (L) 15 - 41 U/L   ALT 7 0 - 44 U/L   Alkaline  Phosphatase 87 38 - 126 U/L   Total Bilirubin 0.8 0.3 - 1.2 mg/dL   GFR calc non Af Amer 36 (L) >60 mL/min   GFR calc Af Amer 42 (L) >60 mL/min   Anion gap 6 5 - 15    Comment: Performed at [facility], [address], [place], [place] [address]  Lipase, blood     Status: None   Collection Time: 08/07/18  7:18 AM  Result Value Ref Range   Lipase 37 11 - 51 U/L    Comment: Performed at [facility], [address] [place], [place] [address]  CBC with Diff     Status: Abnormal   Collection Time: 08/07/18  7:18 AM  Result Value Ref Range   WBC 3.9 (L) 4.0 - 10.5 K/uL   RBC 2.28 (L) 4.22 - 5.81 MIL/uL   Hemoglobin 4.7 (LL) 13.0 - 17.0 g/dL    Comment: Reticulocyte Hemoglobin testing may be clinically indicated, consider ordering this additional test [id number] THIS CRITICAL RESULT HAS VERIFIED AND BEEN CALLED TO HALL, C. RN BY NICOLE MCCOY ON 05 11 2020 AT 0742, AND HAS BEEN READ BACK. CRITICAL RESULT VERIFIED    HCT 17.5 (L) 39.0 - 52.0 %   MCV 76.8 (L) 80.0 - 100.0 fL   MCH 20.6 (L) 26.0 - 34.0 pg   MCHC 26.9 (L) 30.0 - 36.0 g/dL   RDW 95.6 (H) 21.3 - 08.6 %   Platelets 95 (L) 150 - 400 K/uL    Comment: REPEATED TO VERIFY PLATELET COUNT CONFIRMED BY SMEAR SPECIMEN CHECKED FOR CLOTS Immature Platelet Fraction may be clinically indicated, consider ordering this additional test [id number]    nRBC 0.0 0.0 - 0.2 %   Neutrophils Relative % 66 %   Neutro Abs 2.6 1.7 - 7.7 K/uL   Lymphocytes Relative 20 %   Lymphs Abs 0.8 0.7 - 4.0 K/uL   Monocytes Relative 8 %   Monocytes Absolute 0.3 0.1 - 1.0 K/uL   Eosinophils Relative 5 %   Eosinophils Absolute 0.2 0.0 - 0.5 K/uL   Basophils Relative 1 %   Basophils Absolute 0.0 0.0 - 0.1 K/uL   Immature Granulocytes 0 %   Abs Immature Granulocytes 0.01 0.00 - 0.07 K/uL   Polychromasia PRESENT    Target  Cells PRESENT     Comment: Performed at [facility], [address]  [place], [place] [address]  Protime-INR     Status: Abnormal   Collection Time: 08/07/18  7:18 AM  Result Value Ref Range   Prothrombin Time 18.9 (H) 11.4 - 15.2 seconds   INR 1.6 (H) 0.8 - 1.2    Comment: (NOTE) INR goal varies based on device and disease states. Performed at [facility], [address], [place], [place] [address]   APTT     Status: None   Collection Time: 08/07/18  7:18 AM  Result Value Ref Range   aPTT 31 24 - 36 seconds    Comment: Performed at [facility], [address], [place], [place] [address]  Reticulocytes     Status: Abnormal   Collection Time: 08/07/18  7:18 AM  Result Value Ref Range   Retic Ct Pct 1.2 0.4 - 3.1 %   RBC. 2.28 (L) 4.22 - 5.81 MIL/uL   Retic Count, Absolute 26.9 19.0 - 186.0 K/uL   Immature Retic Fract 10.9 2.3 - 15.9 %    Comment: Performed at [facility], [address], [place], [place] [address]  Type and screen [facility]     Status: None (Preliminary result)   Collection Time: 08/07/18  8:00 AM  Result Value Ref Range   ABO/RH(D) A POS    Antibody Screen NEG    Sample Expiration 08/10/2018,2359    Unit Number [id number]    Blood Component Type RED CELLS,LR    Unit division 00    Status of Unit ISSUED    Transfusion Status OK TO TRANSFUSE    Crossmatch Result      Compatible Performed at [facility], [address], [place], [place] [address]    Unit Number [id number]    Blood Component Type RED CELLS,LR    Unit division 00    Status of Unit ALLOCATED    Transfusion Status OK TO TRANSFUSE    Crossmatch Result Compatible   Vitamin B12     Status: None   Collection Time: 08/07/18  8:00 AM  Result Value Ref Range   Vitamin B-12 644 180 - 914 pg/mL    Comment: (NOTE) This assay is not validated for testing neonatal or myeloproliferative syndrome specimens for Vitamin B12 levels. Performed at [facility], [address], [place], [place] [address]   Folate     Status: None   Collection Time: 08/07/18  8:00 AM  Result Value Ref Range   Folate 6.5 >5.9 ng/mL    Comment: Performed at [facility], [address], [place], [place] [address]  Iron and TIBC     Status: Abnormal   Collection Time: 08/07/18  8:00 AM  Result Value Ref Range   Iron 15 (L) 45 - 182 ug/dL   TIBC 347 425 - 956 ug/dL   Saturation Ratios 4 (L) 17.9 - 39.5 %   UIBC 333 ug/dL    Comment: Performed at [facility], [address], [place], [place] [address]  Ferritin     Status: Abnormal   Collection Time: 08/07/18  8:00 AM  Result Value Ref Range   Ferritin 3 (L) 24 - 336 ng/mL    Comment: Performed at [facility], [address] [place], [place] [address]  Prepare RBC     Status: None   Collection  Time: 08/07/18  9:00 AM  Result Value Ref Range   Order Confirmation      ORDER PROCESSED BY BLOOD BANK Performed at [facility], [address], [place], [place] [address]   SARS Coronavirus 2 (CEPHEID - Performed in [facility] hospital lab), Hosp Order     Status: None   Collection Time: 08/07/18 10:21 AM  Result Value Ref Range   SARS Coronavirus 2 NEGATIVE NEGATIVE    Comment: (NOTE) If result is NEGATIVE SARS-CoV-2 target nucleic acids are NOT DETECTED. The SARS-CoV-2 RNA is generally detectable in upper and lower  respiratory specimens during the acute phase of infection. The lowest  concentration of SARS-CoV-2 viral copies this assay can detect is 250  copies / mL. A negative result does not preclude SARS-CoV-2 infection  and should not be used as the sole basis for treatment or other  patient management decisions.  A negative result may occur with  improper specimen collection / handling, submission of specimen other  than nasopharyngeal swab, presence of viral mutation(s) within the  areas targeted by this assay,  and inadequate number of viral copies  (<250 copies / mL). A negative result must be combined with clinical  observations, patient history, and epidemiological information. If result is POSITIVE SARS-CoV-2 target nucleic acids are DETECTED. The SARS-CoV-2 RNA is generally detectable in upper and lower  respiratory specimens dur ing the acute phase of infection.  Positive  results are indicative of active infection with SARS-CoV-2.  Clinical  correlation with patient history and other diagnostic information is  necessary to determine patient infection status.  Positive results do  not rule out bacterial infection or co-infection with other viruses. If result is PRESUMPTIVE POSTIVE SARS-CoV-2 nucleic acids MAY BE PRESENT.   A presumptive positive result was obtained on the submitted specimen  and confirmed on repeat testing.  While 2019 novel coronavirus  (SARS-CoV-2) nucleic acids may be present in the submitted sample  additional confirmatory testing may be necessary for epidemiological  and / or clinical management purposes  to differentiate between  SARS-CoV-2 and other Sarbecovirus currently known to infect humans.  If clinically indicated additional testing with an alternate test  methodology [phone]) is advised. The SARS-CoV-2 RNA is generally  detectable in upper and lower respiratory sp ecimens during the acute  phase of infection. The expected result is Negative. Fact Sheet for Patients:  BoilerBrush.com.cy Fact Sheet for Healthcare Providers: https://pope.com/ This test is not yet approved or cleared by the [place] FDA and has been authorized for detection and/or diagnosis of SARS-CoV-2 by FDA under an Emergency Use Authorization (EUA).  This EUA will remain in effect (meaning this test can be used) for the duration of the COVID-19 declaration under Section 564(b)(1) of the Act, 21 U.S.C. section 360bbb-3(b)(1), unless  the authorization is terminated or revoked sooner. Performed at [facility], [address] [place], [place] [address]    Ct Abdomen Pelvis Wo Contrast  Result Date: 08/07/2018 CLINICAL DATA:  Abdominal pain, fall 5 days ago. EXAM: CT ABDOMEN AND PELVIS WITHOUT CONTRAST TECHNIQUE: Multidetector CT imaging of the abdomen and pelvis was performed following the standard protocol without IV contrast. COMPARISON:  10/15/2017 FINDINGS: Lower chest: Trace right pleural effusion. Heart is mildly enlarged. No confluent opacities. Esophageal varices present. Hepatobiliary: Small shrunken liver compatible with cirrhosis. Small layering gallstones within the gallbladder. No visible focal hepatic abnormality. Pancreas: No focal abnormality or ductal dilatation. Spleen: Enlarged with a craniocaudal length of  21 cm. Adrenals/Urinary Tract: Low-density lesion in the midpole of the right kidney measures 2.7 cm, stable, likely cyst although this cannot be characterized without intravenous contrast. Adrenal glands unremarkable. No hydronephrosis. Urinary bladder unremarkable. Stomach/Bowel: Normal appendix. Stomach, large and small bowel grossly unremarkable. Vascular/Lymphatic: Aortic atherosclerosis. No enlarged abdominal or pelvic lymph nodes. Upper abdominal varices. Reproductive: No visible focal abnormality. Other: Moderate free fluid in the abdomen and pelvis.  No free air. Musculoskeletal: Slight compression deformity through the superior endplate at L1, new since prior study. IMPRESSION: Changes of cirrhosis with associated splenomegaly, upper abdominal and esophageal varices, and moderate ascites. Trace right pleural effusion. Cholelithiasis. Aortic atherosclerosis. Slight compression deformity through the superior endplate of L2, new since prior study. Electronically Signed   By: Charlett NoseKevin  Dover M.D.   On: 08/07/2018 08:39    Pending Labs Unresulted Labs (From admission, onward)    Start      Ordered   08/08/18 0500  Comprehensive metabolic panel  Tomorrow morning,   R     08/07/18 0935   08/08/18 0500  CBC  Tomorrow morning,   R     08/07/18 0935   08/07/18 1056  CBC  Once,   R    Comments:  Please draw  2 hours after blood transfusion    08/07/18 1055   08/07/18 0634  Urinalysis, Complete w Microscopic  ONCE - STAT,   STAT     08/07/18 0633   08/07/18 0633  Urinalysis, Routine w reflex microscopic  ONCE - STAT,   STAT     08/07/18 0633          Vitals/Pain Today's Vitals   08/07/18 1047 08/07/18 1100 08/07/18 1130 08/07/18 1200  BP: (!) 114/47 (!) 112/43 90/61 (!) 99/49  Hull: 62 73 68 69  Resp: 19 16 18  (!) 21  Temp:      TempSrc:      SpO2: 99% 97% 96% 93%  Weight:      Height:      PainSc:        Isolation Precautions No active isolations  Medications Medications  pantoprazole (PROTONIX) 80 mg in sodium chloride 0.9 % 250 mL (0.32 mg/mL) infusion (8 mg/hr Intravenous New Bag/Given 08/07/18 1031)  pantoprazole (PROTONIX) injection 40 mg (has no administration in time range)  octreotide (SANDOSTATIN) 2 mcg/mL load via infusion 50 mcg (50 mcg Intravenous Bolus from Bag 08/07/18 1043)    And  octreotide (SANDOSTATIN) 500 mcg in sodium chloride 0.9 % 250 mL (2 mcg/mL) infusion (50 mcg/hr Intravenous New Bag/Given 08/07/18 1043)  trihexyphenidyl (ARTANE) tablet 2 mg (has no administration in time range)  cinacalcet (SENSIPAR) tablet 30 mg (has no administration in time range)  0.9 %  sodium chloride infusion (has no administration in time range)  ferumoxytol (FERAHEME) 510 mg in sodium chloride 0.9 % 100 mL IVPB (has no administration in time range)  oxyCODONE-acetaminophen (PERCOCET/ROXICET) 5-325 MG per tablet 1 tablet (1 tablet Oral Given 08/07/18 0657)  ondansetron (ZOFRAN-ODT) disintegrating tablet 4 mg (4 mg Oral Given 08/07/18 0657)  0.9 %  sodium chloride infusion (Manually program via Guardrails IV Fluids) ( Intravenous New Bag/Given 08/07/18 1024)   pantoprazole (PROTONIX) 80 mg in sodium chloride 0.9 % 100 mL IVPB (0 mg Intravenous Stopped 08/07/18 1059)    Mobility walks 3

## 2018-08-07 NOTE — ED Notes (Signed)
Urinal provided. Pt encouraged to provide urine per MD order.

## 2018-08-07 NOTE — ED Notes (Signed)
Patient transported to CT 

## 2018-08-07 NOTE — ED Triage Notes (Signed)
[facility] transported pt from home and reports the following:  Abdominal pain started 2-3 hours ago.  Fell 5 days ago and has slight back pain from the fall. Hx chronic leg pain, gi bleed

## 2018-08-07 NOTE — ED Notes (Signed)
Benjamin Mane, MD at bedside and made patient aware of blood transfusion.

## 2018-08-07 NOTE — ED Notes (Signed)
MD at bedside. 

## 2018-08-08 LAB — CBC
HCT: 19.3 % — ABNORMAL LOW (ref 39.0–52.0)
Hemoglobin: 5.4 g/dL — CL (ref 13.0–17.0)
MCH: 22.3 pg — ABNORMAL LOW (ref 26.0–34.0)
MCHC: 28 g/dL — ABNORMAL LOW (ref 30.0–36.0)
MCV: 79.8 fL — ABNORMAL LOW (ref 80.0–100.0)
Platelets: 85 10*3/uL — ABNORMAL LOW (ref 150–400)
RBC: 2.42 MIL/uL — ABNORMAL LOW (ref 4.22–5.81)
RDW: 20.4 % — ABNORMAL HIGH (ref 11.5–15.5)
WBC: 3.3 10*3/uL — ABNORMAL LOW (ref 4.0–10.5)
nRBC: 0 % (ref 0.0–0.2)

## 2018-08-08 LAB — URINALYSIS, COMPLETE (UACMP) WITH MICROSCOPIC
Bacteria, UA: NONE SEEN
Bilirubin Urine: NEGATIVE
Glucose, UA: NEGATIVE mg/dL
Hgb urine dipstick: NEGATIVE
Ketones, ur: NEGATIVE mg/dL
Leukocytes,Ua: NEGATIVE
Nitrite: NEGATIVE
Protein, ur: NEGATIVE mg/dL
Specific Gravity, Urine: 1.009 (ref 1.005–1.030)
pH: 6 (ref 5.0–8.0)

## 2018-08-08 LAB — COMPREHENSIVE METABOLIC PANEL
ALT: 6 U/L (ref 0–44)
AST: 9 U/L — ABNORMAL LOW (ref 15–41)
Albumin: 2.7 g/dL — ABNORMAL LOW (ref 3.5–5.0)
Alkaline Phosphatase: 80 U/L (ref 38–126)
Anion gap: 4 — ABNORMAL LOW (ref 5–15)
BUN: 23 mg/dL — ABNORMAL HIGH (ref 6–20)
CO2: 23 mmol/L (ref 22–32)
Calcium: 7.9 mg/dL — ABNORMAL LOW (ref 8.9–10.3)
Chloride: 113 mmol/L — ABNORMAL HIGH (ref 98–111)
Creatinine, Ser: 1.97 mg/dL — ABNORMAL HIGH (ref 0.61–1.24)
GFR calc Af Amer: 42 mL/min — ABNORMAL LOW (ref >60)
GFR calc non Af Amer: 36 mL/min — ABNORMAL LOW (ref >60)
Glucose, Bld: 91 mg/dL (ref 70–99)
Potassium: 4.8 mmol/L (ref 3.5–5.1)
Sodium: 140 mmol/L (ref 135–145)
Total Bilirubin: 1.3 mg/dL — ABNORMAL HIGH (ref 0.3–1.2)
Total Protein: 5 g/dL — ABNORMAL LOW (ref 6.5–8.1)

## 2018-08-08 LAB — PREPARE RBC (CROSSMATCH)

## 2018-08-08 MED ORDER — SODIUM CHLORIDE 0.9% IV SOLUTION
Freq: Once | INTRAVENOUS | Status: AC
  Administered 2018-08-08: 05:00:00 via INTRAVENOUS

## 2018-08-08 NOTE — Progress Notes (Signed)
Patient is adamant about going home. Explained the importance of him staying for second unit of blood and treatment. Md in and discussed as well. Pt still demanding to leave. Explained will have to leave AMA and he agrees and signs AMA papers. Belongings returned to pt. Melton Alar, RN

## 2018-08-08 NOTE — Discharge Summary (Signed)
Physician Discharge Summary  [name] ZRA:[id number] DOB: 10-10-1960 DOA: 08/07/2018  PCP: Fleet Contras, MD  Admit date: 08/07/2018 Discharge date: 08/08/2018  Admitted From: Home Disposition:  LEFT AMA  Recommendations for Outpatient Follow-up:  1. Follow up with PCP in 1-2 weeks 2. Please obtain BMP/CBC in one week 3. Please follow up on the following pending results:   Discharge Condition:AMA  Brief/Interim Summary: Please refer to the admission note consult notes in chart for more details. This a 58 year old man with significant history of hepatitis C and cirrhosis adrenal insufficiency CKD stage III PTSD hypothyroidism size affective disorder who came with abdominal pain, was seen in the ER rib pain has resolved after getting oxycodone patient denied any nausea vomiting hematemesis hemoptysis or hematochezia and was wanting to go home.  Patient denied alcohol use but admits to daily smoking He was found to have low hemoglobin 4.7 g and was admitted. Patient received 2 unit PRBC 5/11 and overnight was ordered 2 units of with received 1 units.   When I saw the patient he reported that the was made to him yesterday that he will go home today.  Reports he is fine he is not bleeding and he would like to be discharged.  I explained him that if his hemoglobin is not stable enough for discharge.  He had refused further GI evaluation yesterday. Of note he has had similar admission in the past and had refused work-up EGD. I have patient's nurse come in the room and witness the conversation. He is asking to be discharged> I EXPLAINED at this time he is stable for discharge however patient reported he understands and would like to go home anyway.  I explained the risk of worsening hemoglobin which could lead to further complications including but normally not limited to shock, death.  He verbalized understanding, he was alert awake oriented x3. He understood the consequences of leaving AGAINST  MEDICAL ADVICE.  He  was adamant on leaving stating "I am going to be fine" "I am not going to die" "I am not bleeding".    Please let me go home. I will call cab and leave.   Discharge Diagnoses:  Active Problems:   GI bleed    Discharge Instructions   Allergies as of 08/08/2018      Reactions   Heparin Other (See Comments)   unknown   Imipramine Other (See Comments)   unknown   Lamictal [lamotrigine] Other (See Comments)   unknown   Penicillins Other (See Comments)   Pt can not remember allergy and is unable to review questions DID THE REACTION INVOLVE: Swelling of the face/tongue/throat, SOB, or low BP? UNK Sudden or severe rash/hives, skin peeling, or the inside of the mouth or nose? UNK Did it require medical treatment? UNK When did it last happen?can't remember If all above answers are "NO", may proceed with cephalosporin use.      Medication List    ASK your doctor about these medications   [facility] D3 50 MCG (2000 UT) Caps Generic drug:  Cholecalciferol Take 2,000 Units by mouth daily.   diphenhydrAMINE 25 MG tablet Commonly known as:  BENADRYL Take 25 mg by mouth at bedtime.   famotidine 20 MG tablet Commonly known as:  PEPCID Take 20 mg by mouth at bedtime.   fluPHENAZine 5 MG tablet Commonly known as:  PROLIXIN Take 5 mg by mouth 3 (three) times daily.   furosemide 20 MG tablet Commonly known as:  LASIX Take 20 mg  by mouth daily.   gabapentin 100 MG capsule Commonly known as:  NEURONTIN Take 200 mg by mouth 3 (three) times daily. Ask about: Which instructions should I use?   LORazepam 0.5 MG tablet Commonly known as:  ATIVAN Take 0.5 mg by mouth every 8 (eight) hours as needed for anxiety.   meloxicam 15 MG tablet Commonly known as:  MOBIC Take 15 mg by mouth daily.   ondansetron 8 MG disintegrating tablet Commonly known as:  Zofran ODT Take 1 tablet (8 mg total) by mouth every 8 (eight) hours as needed for nausea or vomiting.    oxyCODONE 5 MG immediate release tablet Commonly known as:  Oxy IR/ROXICODONE Take 1 tablet (5 mg total) by mouth every 6 (six) hours as needed for severe pain.   pantoprazole 40 MG tablet Commonly known as:  PROTONIX Take 1 tablet (40 mg total) by mouth 2 (two) times daily.   Sensipar 30 MG tablet Generic drug:  cinacalcet Take 30 mg by mouth daily.   trihexyphenidyl 2 MG tablet Commonly known as:  ARTANE Take 2 mg by mouth 3 (three) times daily.       Allergies  Allergen Reactions  . Heparin Other (See Comments)    unknown  . Imipramine Other (See Comments)    unknown  . Lamictal [Lamotrigine] Other (See Comments)    unknown  . Penicillins Other (See Comments)    Pt can not remember allergy and is unable to review questions DID THE REACTION INVOLVE: Swelling of the face/tongue/throat, SOB, or low BP? UNK Sudden or severe rash/hives, skin peeling, or the inside of the mouth or nose? UNK Did it require medical treatment? UNK When did it last happen?can't remember If all above answers are "NO", may proceed with cephalosporin use.     Consultations:  GI   Procedures/Studies: Ct Abdomen Pelvis Wo Contrast  Result Date: 08/07/2018 CLINICAL DATA:  Abdominal pain, fall 5 days ago. EXAM: CT ABDOMEN AND PELVIS WITHOUT CONTRAST TECHNIQUE: Multidetector CT imaging of the abdomen and pelvis was performed following the standard protocol without IV contrast. COMPARISON:  10/15/2017 FINDINGS: Lower chest: Trace right pleural effusion. Heart is mildly enlarged. No confluent opacities. Esophageal varices present. Hepatobiliary: Small shrunken liver compatible with cirrhosis. Small layering gallstones within the gallbladder. No visible focal hepatic abnormality. Pancreas: No focal abnormality or ductal dilatation. Spleen: Enlarged with a craniocaudal length of 21 cm. Adrenals/Urinary Tract: Low-density lesion in the midpole of the right kidney measures 2.7 cm, stable, likely cyst  although this cannot be characterized without intravenous contrast. Adrenal glands unremarkable. No hydronephrosis. Urinary bladder unremarkable. Stomach/Bowel: Normal appendix. Stomach, large and small bowel grossly unremarkable. Vascular/Lymphatic: Aortic atherosclerosis. No enlarged abdominal or pelvic lymph nodes. Upper abdominal varices. Reproductive: No visible focal abnormality. Other: Moderate free fluid in the abdomen and pelvis.  No free air. Musculoskeletal: Slight compression deformity through the superior endplate at L1, new since prior study. IMPRESSION: Changes of cirrhosis with associated splenomegaly, upper abdominal and esophageal varices, and moderate ascites. Trace right pleural effusion. Cholelithiasis. Aortic atherosclerosis. Slight compression deformity through the superior endplate of L2, new since prior study. Electronically Signed   By: Charlett Nose M.D.   On: 08/07/2018 08:39      Subjective: RESTING, DENIES nausea vomiting, abdominal pain shortness with fever or chills.  Discharge Exam: Vitals:   08/08/18 0503 08/08/18 0734  BP: (!) 106/54 114/79  Pulse: 64 68  Resp: 20 18  Temp: 98 F (36.7 C) 98.5 F (36.9 C)  SpO2: 91% (!) 89%   Vitals:   08/07/18 1932 08/08/18 0438 08/08/18 0503 08/08/18 0734  BP: (!) 103/53 (!) 103/57 (!) 106/54 114/79  Pulse: 63 64 64 68  Resp: 18 18 20 18   Temp: 98.3 F (36.8 C) 98.5 F (36.9 C) 98 F (36.7 C) 98.5 F (36.9 C)  TempSrc: Oral Oral Oral Oral  SpO2: 96% 93% 91% (!) 89%  Weight:      Height:        General: Pt is alert, awake, not in acute distress, is aaox3   The results of significant diagnostics from this hospitalization (including imaging, microbiology, ancillary and laboratory) are listed below for reference.     Microbiology: Recent Results (from the past 240 hour(s))  SARS Coronavirus 2 (CEPHEID - Performed in [facility] hospital lab), Hosp Order     Status: None   Collection Time: 08/07/18 10:21 AM   Result Value Ref Range Status   SARS Coronavirus 2 NEGATIVE NEGATIVE Final    Comment: (NOTE) If result is NEGATIVE SARS-CoV-2 target nucleic acids are NOT DETECTED. The SARS-CoV-2 RNA is generally detectable in upper and lower  respiratory specimens during the acute phase of infection. The lowest  concentration of SARS-CoV-2 viral copies this assay can detect is 250  copies / mL. A negative result does not preclude SARS-CoV-2 infection  and should not be used as the sole basis for treatment or other  patient management decisions.  A negative result may occur with  improper specimen collection / handling, submission of specimen other  than nasopharyngeal swab, presence of viral mutation(s) within the  areas targeted by this assay, and inadequate number of viral copies  (<250 copies / mL). A negative result must be combined with clinical  observations, patient history, and epidemiological information. If result is POSITIVE SARS-CoV-2 target nucleic acids are DETECTED. The SARS-CoV-2 RNA is generally detectable in upper and lower  respiratory specimens dur ing the acute phase of infection.  Positive  results are indicative of active infection with SARS-CoV-2.  Clinical  correlation with patient history and other diagnostic information is  necessary to determine patient infection status.  Positive results do  not rule out bacterial infection or co-infection with other viruses. If result is PRESUMPTIVE POSTIVE SARS-CoV-2 nucleic acids MAY BE PRESENT.   A presumptive positive result was obtained on the submitted specimen  and confirmed on repeat testing.  While 2019 novel coronavirus  (SARS-CoV-2) nucleic acids may be present in the submitted sample  additional confirmatory testing may be necessary for epidemiological  and / or clinical management purposes  to differentiate between  SARS-CoV-2 and other Sarbecovirus currently known to infect humans.  If clinically indicated additional  testing with an alternate test  methodology [phone]([id number]) is advised. The SARS-CoV-2 RNA is generally  detectable in upper and lower respiratory sp ecimens during the acute  phase of infection. The expected result is Negative. Fact Sheet for Patients:  BoilerBrush.com.cyhttps://www.fda.gov/media/136312/download Fact Sheet for Healthcare Providers: https://pope.com/https://www.fda.gov/media/136313/download This test is not yet approved or cleared by the [place] FDA and has been authorized for detection and/or diagnosis of SARS-CoV-2 by FDA under an Emergency Use Authorization (EUA).  This EUA will remain in effect (meaning this test can be used) for the duration of the COVID-19 declaration under Section 564(b)(1) of the Act, 21 U.S.C. section 360bbb-3(b)(1), unless the authorization is terminated or revoked sooner. Performed at [facility], [address] [place], [place] [address]      Labs:  BNP (last 3 results) No results for input(s): BNP in the last 8760 hours. Basic Metabolic Panel: Recent Labs  Lab 08/07/18 0718 08/08/18 0135  NA 142 140  K 3.9 4.8  CL 112* 113*  CO2 24 23  GLUCOSE 99 91  BUN 29* 23*  CREATININE 1.98* 1.97*  CALCIUM 8.8* 7.9*   Liver Function Tests: Recent Labs  Lab 08/07/18 0718 08/08/18 0135  AST 9* 9*  ALT 7 6  ALKPHOS 87 80  BILITOT 0.8 1.3*  PROT 5.8* 5.0*  ALBUMIN 3.0* 2.7*   Recent Labs  Lab 08/07/18 0718  LIPASE 37   No results for input(s): AMMONIA in the last 168 hours. CBC: Recent Labs  Lab 08/07/18 0718 08/08/18 0135  WBC 3.9* 3.3*  NEUTROABS 2.6  --   HGB 4.7* 5.4*  HCT 17.5* 19.3*  MCV 76.8* 79.8*  PLT 95* 85*   Cardiac Enzymes: No results for input(s): CKTOTAL, CKMB, CKMBINDEX, TROPONINI in the last 168 hours. BNP: Invalid input(s): POCBNP CBG: No results for input(s): GLUCAP in the last 168 hours. D-Dimer No results for input(s): DDIMER in the last 72 hours. Hgb A1c No results for input(s): HGBA1C in the last 72  hours. Lipid Profile No results for input(s): CHOL, HDL, LDLCALC, TRIG, CHOLHDL, LDLDIRECT in the last 72 hours. Thyroid function studies No results for input(s): TSH, T4TOTAL, T3FREE, THYROIDAB in the last 72 hours.  Invalid input(s): FREET3 Anemia work up Recent Labs    08/07/18 0718 08/07/18 0800  VITAMINB12  --  644  FOLATE  --  6.5  FERRITIN  --  3*  TIBC  --  348  IRON  --  15*  RETICCTPCT 1.2  --    Urinalysis    Component Value Date/Time   COLORURINE YELLOW 08/07/2018 0634   APPEARANCEUR CLEAR 08/07/2018 0634   LABSPEC 1.009 08/07/2018 0634   PHURINE 6.0 08/07/2018 0634   GLUCOSEU NEGATIVE 08/07/2018 0634   HGBUR NEGATIVE 08/07/2018 0634   BILIRUBINUR NEGATIVE 08/07/2018 0634   KETONESUR NEGATIVE 08/07/2018 0634   PROTEINUR NEGATIVE 08/07/2018 0634   UROBILINOGEN 1.0 01/19/2013 0015   NITRITE NEGATIVE 08/07/2018 0634   LEUKOCYTESUR NEGATIVE 08/07/2018 0634   Sepsis Labs Invalid input(s): PROCALCITONIN,  WBC,  LACTICIDVEN Microbiology Recent Results (from the past 240 hour(s))  SARS Coronavirus 2 (CEPHEID - Performed in [facility] hospital lab), Hosp Order     Status: None   Collection Time: 08/07/18 10:21 AM  Result Value Ref Range Status   SARS Coronavirus 2 NEGATIVE NEGATIVE Final    Comment: (NOTE) If result is NEGATIVE SARS-CoV-2 target nucleic acids are NOT DETECTED. The SARS-CoV-2 RNA is generally detectable in upper and lower  respiratory specimens during the acute phase of infection. The lowest  concentration of SARS-CoV-2 viral copies this assay can detect is 250  copies / mL. A negative result does not preclude SARS-CoV-2 infection  and should not be used as the sole basis for treatment or other  patient management decisions.  A negative result may occur with  improper specimen collection / handling, submission of specimen other  than nasopharyngeal swab, presence of viral mutation(s) within the  areas targeted by this assay, and inadequate  number of viral copies  (<250 copies / mL). A negative result must be combined with clinical  observations, patient history, and epidemiological information. If result is POSITIVE SARS-CoV-2 target nucleic acids are DETECTED. The SARS-CoV-2 RNA is generally detectable in upper and lower  respiratory specimens dur ing the acute  phase of infection.  Positive  results are indicative of active infection with SARS-CoV-2.  Clinical  correlation with patient history and other diagnostic information is  necessary to determine patient infection status.  Positive results do  not rule out bacterial infection or co-infection with other viruses. If result is PRESUMPTIVE POSTIVE SARS-CoV-2 nucleic acids MAY BE PRESENT.   A presumptive positive result was obtained on the submitted specimen  and confirmed on repeat testing.  While 2019 novel coronavirus  (SARS-CoV-2) nucleic acids may be present in the submitted sample  additional confirmatory testing may be necessary for epidemiological  and / or clinical management purposes  to differentiate between  SARS-CoV-2 and other Sarbecovirus currently known to infect humans.  If clinically indicated additional testing with an alternate test  methodology [phone]([id number]) is advised. The SARS-CoV-2 RNA is generally  detectable in upper and lower respiratory sp ecimens during the acute  phase of infection. The expected result is Negative. Fact Sheet for Patients:  BoilerBrush.com.cyhttps://www.fda.gov/media/136312/download Fact Sheet for Healthcare Providers: https://pope.com/https://www.fda.gov/media/136313/download This test is not yet approved or cleared by the [place] FDA and has been authorized for detection and/or diagnosis of SARS-CoV-2 by FDA under an Emergency Use Authorization (EUA).  This EUA will remain in effect (meaning this test can be used) for the duration of the COVID-19 declaration under Section 564(b)(1) of the Act, 21 U.S.C. section 360bbb-3(b)(1), unless the  authorization is terminated or revoked sooner. Performed at [facility], [address] [place], [place] [address]      Time coordinating discharge: 25 minutes  SIGNED:   Lanae Boastamesh Kathyleen Radice, MD  [facility] 08/08/2018, 1:58 PM  If 7PM-7AM, please contact night-coverage [email or web address]

## 2018-08-09 LAB — HEPATITIS C ANTIBODY: HCV Ab: >11 s/co ratio — ABNORMAL HIGH (ref 0.0–0.9)

## 2018-08-11 LAB — TYPE AND SCREEN
ABO/RH(D): A POS
Antibody Screen: NEGATIVE
Unit division: 0
Unit division: 0
Unit division: 0
Unit division: 0

## 2018-08-11 LAB — BPAM RBC
Blood Product Expiration Date: 202005282359
Blood Product Expiration Date: 202005282359
Blood Product Expiration Date: 202005292359
Blood Product Expiration Date: 202005292359
ISSUE DATE / TIME: 202005110958
ISSUE DATE / TIME: 202005111517
ISSUE DATE / TIME: 202005120442
Unit Type and Rh: 6200
Unit Type and Rh: 6200
Unit Type and Rh: 6200
Unit Type and Rh: 6200

## 2018-08-16 LAB — HCV RNA QUANT: HCV Quantitative: NOT DETECTED IU/mL (ref >50)

## 2018-08-19 ENCOUNTER — Emergency Department (HOSPITAL_COMMUNITY): Payer: Medicaid Other

## 2018-08-19 ENCOUNTER — Emergency Department (HOSPITAL_COMMUNITY)
Admission: EM | Admit: 2018-08-19 | Discharge: 2018-08-19 | Disposition: A | Discharge status: Home / Self Care | Payer: Medicaid Other | Attending: Emergency Medicine | Admitting: Emergency Medicine

## 2018-08-19 ENCOUNTER — Other Ambulatory Visit: Payer: Self-pay

## 2018-08-19 ENCOUNTER — Encounter (HOSPITAL_COMMUNITY): Payer: Self-pay

## 2018-08-19 DIAGNOSIS — D649 Anemia, unspecified: Secondary | ICD-10-CM | POA: Insufficient documentation

## 2018-08-19 DIAGNOSIS — N183 Chronic kidney disease, stage 3 (moderate): Secondary | ICD-10-CM | POA: Diagnosis not present

## 2018-08-19 DIAGNOSIS — M79672 Pain in left foot: Secondary | ICD-10-CM | POA: Insufficient documentation

## 2018-08-19 DIAGNOSIS — F17210 Nicotine dependence, cigarettes, uncomplicated: Secondary | ICD-10-CM | POA: Diagnosis not present

## 2018-08-19 DIAGNOSIS — Z79899 Other long term (current) drug therapy: Secondary | ICD-10-CM | POA: Insufficient documentation

## 2018-08-19 DIAGNOSIS — E039 Hypothyroidism, unspecified: Secondary | ICD-10-CM | POA: Diagnosis not present

## 2018-08-19 DIAGNOSIS — M79671 Pain in right foot: Secondary | ICD-10-CM | POA: Diagnosis present

## 2018-08-19 LAB — CBC WITH DIFFERENTIAL/PLATELET
Abs Immature Granulocytes: 0.01 10*3/uL (ref 0.00–0.07)
Basophils Absolute: 0 10*3/uL (ref 0.0–0.1)
Basophils Relative: 1 %
Eosinophils Absolute: 0.2 10*3/uL (ref 0.0–0.5)
Eosinophils Relative: 5 %
HCT: 26.7 % — ABNORMAL LOW (ref 39.0–52.0)
Hemoglobin: 7.5 g/dL — ABNORMAL LOW (ref 13.0–17.0)
Immature Granulocytes: 0 %
Lymphocytes Relative: 14 %
Lymphs Abs: 0.6 10*3/uL — ABNORMAL LOW (ref 0.7–4.0)
MCH: 25 pg — ABNORMAL LOW (ref 26.0–34.0)
MCHC: 28.1 g/dL — ABNORMAL LOW (ref 30.0–36.0)
MCV: 89 fL (ref 80.0–100.0)
Monocytes Absolute: 0.3 10*3/uL (ref 0.1–1.0)
Monocytes Relative: 8 %
Neutro Abs: 2.8 10*3/uL (ref 1.7–7.7)
Neutrophils Relative %: 72 %
Platelets: 56 10*3/uL — ABNORMAL LOW (ref 150–400)
RBC: 3 MIL/uL — ABNORMAL LOW (ref 4.22–5.81)
RDW: 28.3 % — ABNORMAL HIGH (ref 11.5–15.5)
WBC: 3.9 10*3/uL — ABNORMAL LOW (ref 4.0–10.5)
nRBC: 0 % (ref 0.0–0.2)

## 2018-08-19 LAB — BASIC METABOLIC PANEL
Anion gap: 9 (ref 5–15)
BUN: 15 mg/dL (ref 6–20)
CO2: 20 mmol/L — ABNORMAL LOW (ref 22–32)
Calcium: 8.7 mg/dL — ABNORMAL LOW (ref 8.9–10.3)
Chloride: 111 mmol/L (ref 98–111)
Creatinine, Ser: 1.7 mg/dL — ABNORMAL HIGH (ref 0.61–1.24)
GFR calc Af Amer: 50 mL/min — ABNORMAL LOW (ref >60)
GFR calc non Af Amer: 43 mL/min — ABNORMAL LOW (ref >60)
Glucose, Bld: 108 mg/dL — ABNORMAL HIGH (ref 70–99)
Potassium: 4 mmol/L (ref 3.5–5.1)
Sodium: 140 mmol/L (ref 135–145)

## 2018-08-19 MED ORDER — OXYCODONE HCL 5 MG PO TABS
5.0000 mg | ORAL_TABLET | Freq: Once | ORAL | Status: AC
  Administered 2018-08-19: 09:00:00 5 mg via ORAL
  Filled 2018-08-19: qty 1

## 2018-08-19 MED ORDER — PREDNISONE 20 MG PO TABS: 40.0000 mg | ORAL_TABLET | Freq: Every day | ORAL | 0 refills | Status: DC

## 2018-08-19 NOTE — ED Provider Notes (Signed)
[facility]-EMERGENCY DEPT Provider Note   CSN: [id number] Arrival date & time: 08/19/18  [id number]    History   Chief Complaint Chief Complaint  Patient presents with  . Foot Pain    HPI [name] is a 58 y.o. male.     The history is provided by the patient and medical records. No language interpreter was used.  Foot Pain    [name] is a 58 y.o. male  with a PMH as listed below who presents to the Emergency Department complaining of bilateral foot pain which began 2-3 days ago and has been progressively worsening. Both feet hurt "deep inside" the whole foot, but are worse at the MTP area. Has not tried anything to help. Pain worse with ambulation. Denies hx of similar. States he has arthritis to his "whole body all over" but typically does not get foot pain. Has had gout one time years ago, but does not remember what this felt like. No fever/chills. No numbness, weakness.   Of note, patient was recently admitted to the hospital on 5/11 with hemoglobin of 4.7.  He received 3 units of blood total.  The following day, he left AMA.  He states that he was feeling fine since leaving the hospital.  He denies any blood in the stool or hematemesis.  No syncopal episodes, shortness of breath or feeling weak.   Past Medical History:  Diagnosis Date  . Anemia   . Ascites   . Chronic leg pain   . Cirrhosis (HCC)   . GERD (gastroesophageal reflux disease)   . H/O diabetes insipidus   . Hepatitis C    Completed therapy with Epclusa ~ 2018.  suspect therapy over[place] [facility] [place]  . History of adrenal insufficiency   . History of ARDS   . History of blood transfusion   . Hypothyroidism   . Kidney disease    stage 3  . Neuropathy   . Overdose 12/2009  . Peripheral neuropathy   . Personality disorder (HCC)   . PTSD (post-traumatic stress disorder)    [place]CON[place]  . Schizoaffective disorder [facility](HCC)     Patient  Active Problem List   Diagnosis Date Noted  . GI bleed 08/07/2018  . Acute abdominal pain   . Cirrhosis of liver with ascites (HCC)   . Generalized abdominal pain   . Hematemesis with nausea   . Evaluation by psychiatric service required   . Acute upper gastrointestinal bleeding 10/15/2017  . PTSD (post-traumatic stress disorder)   . Hypothyroidism   . History of adrenal insufficiency   . H/O diabetes insipidus   . Schizoaffective disorder (HCC) 11/08/2011  . Personality disorder (HCC)   . Anemia   . Ascites   . Liver disease   . Hepatitis C   . Kidney disease   . GERD (gastroesophageal reflux disease)   . Psychosis (HCC) 11/07/2011    Past Surgical History:  Procedure Laterality Date  . CERVICAL SPINE SURGERY  unknown  . RIGHT HIP SURGERY  1996  . US GUIDED LEFT THORACENTESIS  01/21/2010        Home Medications    Prior to Admission medications   Medication Sig Start Date End Date Taking? Authorizing Provider  [employer] D3 2000 units CAPS Take 2,000 Units by mouth daily. 10/23/17  Yes [provider]  diphenhydrAMINE (BENADRYL) 25 MG tablet Take 25 mg by mouth at bedtime.    Yes [provider]  famotidine (PEPCID) 20 MG tablet Take 20 mg by mouth at bedtime. 07/12/18  Yes [provider]  fluPHENAZine (PROLIXIN) 5 MG tablet Take 5 mg by mouth 3 (three) times daily.   Yes [provider]  furosemide (LASIX) 20 MG tablet Take 20 mg by mouth daily.    Yes [provider]  gabapentin (NEURONTIN) 100 MG capsule Take 200 mg by mouth 3 (three) times daily. 07/21/18  Yes [provider]  LORazepam (ATIVAN) 0.5 MG tablet Take 0.5 mg by mouth every 8 (eight) hours as needed for anxiety.    Yes [provider]  meloxicam (MOBIC) 15 MG tablet Take 15 mg by mouth daily. 07/06/18  Yes [provider]  oxyCODONE (OXY IR/ROXICODONE) 5 MG immediate release tablet Take 1 tablet (5 mg total) by mouth every 6 (six) hours as  needed for severe pain. 09/08/17  Yes Garlon Hatchet, PA-C  pantoprazole (PROTONIX) 40 MG tablet Take 1 tablet (40 mg total) by mouth 2 (two) times daily. 10/18/17  Yes Simonne Martinet, NP  SENSIPAR 30 MG tablet Take 30 mg by mouth daily. 07/25/17  Yes [provider]  trihexyphenidyl (ARTANE) 2 MG tablet Take 2 mg by mouth 3 (three) times daily.   Yes [provider]  ondansetron (ZOFRAN ODT) 8 MG disintegrating tablet Take 1 tablet (8 mg total) by mouth every 8 (eight) hours as needed for nausea or vomiting. Patient not taking: Reported on 11/02/2017 11/14/15   Azalia Bilis, MD  predniSONE (DELTASONE) 20 MG tablet Take 2 tablets (40 mg total) by mouth daily. 08/19/18   , Chase Picket, PA-C    Family History Family History  Problem Relation Age of Onset  . Cancer Mother   . Heart attack Father     Social History Social History   Tobacco Use  . Smoking status: Current Every Day Smoker    Packs/day: 1.00    Years: 25.00    Pack years: 25.00    Types: Cigarettes  . Smokeless tobacco: Never Used  Substance Use Topics  . Alcohol use: No    Comment: Pt denies   . Drug use: No    Comment: Pt denies     Allergies   Patient has no known allergies.   Review of Systems Review of Systems  Musculoskeletal: Positive for arthralgias.  All other systems reviewed and are negative.    Physical Exam Updated Vital Signs BP (!) 124/96   Pulse 73   Temp 98.4 F (36.9 C) (Oral)   Resp 16   SpO2 98%   Physical Exam Vitals signs and nursing note reviewed.  Constitutional:      General: He is not in acute distress.    Appearance: He is well-developed.  HENT:     Head: Normocephalic and atraumatic.  Neck:     Musculoskeletal: Neck supple.  Cardiovascular:     Rate and Rhythm: Normal rate and regular rhythm.     Heart sounds: Normal heart sounds. No murmur.  Pulmonary:     Effort: Pulmonary effort is normal. No respiratory distress.     Breath sounds:  Normal breath sounds.  Abdominal:     General: There is no distension.     Palpations: Abdomen is soft.     Tenderness: There is no abdominal tenderness.  Musculoskeletal:     Comments: Left foot with tenderness to palpation at the MTP joint with overlying swelling.  Mild erythema noted as well. Right foot  with tenderness to the MTP joint as well, however no associated swelling or skin changes.  Bilateral lower extremities with full strength and range of motion.  2+ DP.  Sensation intact.  Skin:    General: Skin is warm and dry.  Neurological:     Mental Status: He is alert and oriented to person, place, and time.      ED Treatments / Results  Labs (all labs ordered are listed, but only abnormal results are displayed) Labs Reviewed  CBC WITH DIFFERENTIAL/PLATELET - Abnormal; Notable for the following components:      Result Value   WBC 3.9 (*)    RBC 3.00 (*)    Hemoglobin 7.5 (*)    HCT 26.7 (*)    MCH 25.0 (*)    MCHC 28.1 (*)    RDW 28.3 (*)    Platelets 56 (*)    Lymphs Abs 0.6 (*)    All other components within normal limits  BASIC METABOLIC PANEL - Abnormal; Notable for the following components:   CO2 20 (*)    Glucose, Bld 108 (*)    Creatinine, Ser 1.70 (*)    Calcium 8.7 (*)    GFR calc non Af Amer 43 (*)    GFR calc Af Amer 50 (*)    All other components within normal limits    EKG None  Radiology Dg Foot Complete Left  Result Date: 08/19/2018 CLINICAL DATA:  Progressing pain EXAM: LEFT FOOT - COMPLETE 3+ VIEW COMPARISON:  May 25, 2006 FINDINGS: Frontal, oblique, and lateral views. Bones osteoporotic. There is soft tissue edema dorsally over the metatarsals. Expansion along the mid to distal aspect of the second metatarsal is a stable finding, indicative of healing response from old trauma. There is no acute fracture or dislocation. There is moderate osteoarthritic change in all PIP and DIP joints. There is evidence of old trauma along the medial distal  first metatarsal with healing response. No frank erosion. There is pes planus. No bony destruction. IMPRESSION: There is soft tissue edema over the dorsal metatarsal region. Bones osteoporotic. Old trauma involving the medial distal first metatarsal with reactive bony change. Bony expansion along the mid to distal second metatarsal is indicative of healing response from old trauma. There is osteoarthritic change in multiple distal joints. No bony destruction demonstrable. There is pes planus. Electronically Signed   By: Bretta BangWilliam  Woodruff III M.D.   On: 08/19/2018 10:37   Dg Foot Complete Right  Result Date: 08/19/2018 CLINICAL DATA:  Chronic but increasing pain EXAM: RIGHT FOOT COMPLETE - 3+ VIEW COMPARISON:  Right foot radiographs April 17, 2015; right foot MRI June 26, 2015 FINDINGS: Frontal, oblique, and lateral views were obtained. Bones are diffusely osteoporotic. There is stable cortical expansion along the mid to distal right second metatarsal consistent with old trauma with healing response. No acute fracture or dislocation is evident. There is chronic flexion of the second DIP joint. There is a lesser degree of flexion of the second, third, fourth, and fifth PIP and third, fourth, and fifth DIP joints. There is pes planus. No erosive changes or bony destruction. IMPRESSION: Diffuse osteoporosis. Multiple flexion deformities of multiple distal joints, most severe in the second DIP joint. Bony overgrowth along the mid to distal second metatarsal, a stable finding due to healing response from old fracture. No acute fracture or dislocation. No erosive change or bony destruction. There is pes planus. Electronically Signed   By: Bretta BangWilliam  Woodruff III M.D.   On:  08/19/2018 10:34    Procedures Procedures (including critical care time)  Medications Ordered in ED Medications  oxyCODONE (Oxy IR/ROXICODONE) immediate release tablet 5 mg (5 mg Oral Given 08/19/18 4680)     Initial Impression /  Assessment and Plan / ED Course  I have reviewed the triage vital signs and the nursing notes.  Pertinent labs & imaging results that were available during my care of the patient were reviewed by me and considered in my medical decision making (see chart for details).       Othmar Factor is a 58 y.o. male who presents to ED for bilateral foot pain x 2-3 days.  Chart reviewed.  Patient admitted to the hospital on 5/11 for anemia with hemoglobin of 4.7.  He did receive 3 units of blood total, but left the following day AMA.  He denies any hematemesis or blood in stool.  He reports feeling his baseline until foot pain developed 2 to 3 days ago.  Has not had recommended repeat labs by PCP.  Agreeable to repeat hgb check today.  On exam today, patient afebrile, hemodynamically stable tenderness to palpation to bilateral MTP joints.  Left MTP does appear swollen with mild erythema. Full ROM.  Bilateral lower extremities are neurovascularly intact.  Doubt septic joint, especially considering bilateral pain. ddx include gout flair, DM neuropathy, arthritis. Will obtain x-rays to further evaluate.   No acute bony abnormalities on plain films. Will treat with short course steroid burst as he cannot take APAP or NSAID's.   Hgb 7.5. No bleeding. He would like to go home. Labs all reviewed. Appears this is likely a chronic issue for him 2/2 iron deficiency anemia. He has no reports of bleeding. Understands strict return precautions and importance of PCP follow up for further management and monitoring. All questions answered.    Patient discussed with Dr. Jacqulyn Bath who agrees with treatment plan.    Final Clinical Impressions(s) / ED Diagnoses   Final diagnoses:  Bilateral foot pain  Anemia, unspecified type    ED Discharge Orders         Ordered    predniSONE (DELTASONE) 20 MG tablet  Daily     08/19/18 1114           , Chase Picket, PA-C 08/19/18 1142    Maia Plan, MD 08/19/18 1234

## 2018-08-19 NOTE — Discharge Instructions (Signed)
It was my pleasure taking care of you today!   Take Prednisone to help with pain and swelling of your foot.  It is very important that you call your primary care doctor first thing Tuesday morning to schedule a follow up appointment.   You filled your prescription for 5 mg oxycodone on 5/13. You should have plenty of home pain medication.   Return to ER for any blood in your stool, new or worsening symptoms, any additional concerns.

## 2018-08-19 NOTE — ED Triage Notes (Signed)
EMS reports from home, Pt c/o increasing bilateral foot pain x 3 days, denies injury. Pt ambulatory upon EMS arrival.  BP 146/82 HR 80 RR 16 Sp02 95 RA

## 2018-08-19 NOTE — ED Notes (Signed)
Bed: WA21 Expected date:  Expected time:  Means of arrival:  Comments: 58 yo foot pain

## 2018-08-24 ENCOUNTER — Emergency Department (HOSPITAL_COMMUNITY)
Admission: EM | Admit: 2018-08-24 | Discharge: 2018-08-24 | Disposition: A | Discharge status: Home / Self Care | Payer: Medicaid Other | Attending: Emergency Medicine | Admitting: Emergency Medicine

## 2018-08-24 ENCOUNTER — Encounter (HOSPITAL_COMMUNITY): Payer: Self-pay

## 2018-08-24 DIAGNOSIS — M79671 Pain in right foot: Secondary | ICD-10-CM | POA: Insufficient documentation

## 2018-08-24 DIAGNOSIS — E039 Hypothyroidism, unspecified: Secondary | ICD-10-CM | POA: Diagnosis not present

## 2018-08-24 DIAGNOSIS — L03119 Cellulitis of unspecified part of limb: Secondary | ICD-10-CM

## 2018-08-24 DIAGNOSIS — M79672 Pain in left foot: Secondary | ICD-10-CM | POA: Diagnosis present

## 2018-08-24 DIAGNOSIS — Z79899 Other long term (current) drug therapy: Secondary | ICD-10-CM | POA: Diagnosis not present

## 2018-08-24 DIAGNOSIS — N183 Chronic kidney disease, stage 3 (moderate): Secondary | ICD-10-CM | POA: Diagnosis not present

## 2018-08-24 DIAGNOSIS — F17210 Nicotine dependence, cigarettes, uncomplicated: Secondary | ICD-10-CM | POA: Diagnosis not present

## 2018-08-24 DIAGNOSIS — L03116 Cellulitis of left lower limb: Secondary | ICD-10-CM | POA: Diagnosis not present

## 2018-08-24 MED ORDER — CEPHALEXIN 500 MG PO CAPS
500.0000 mg | ORAL_CAPSULE | Freq: Once | ORAL | Status: AC
  Administered 2018-08-24: 09:00:00 500 mg via ORAL
  Filled 2018-08-24: qty 1

## 2018-08-24 MED ORDER — OXYCODONE-ACETAMINOPHEN 5-325 MG PO TABS
1.0000 | ORAL_TABLET | Freq: Once | ORAL | Status: AC
  Administered 2018-08-24: 1 via ORAL
  Filled 2018-08-24: qty 1

## 2018-08-24 MED ORDER — CEPHALEXIN 500 MG PO CAPS: 500.0000 mg | ORAL_CAPSULE | Freq: Four times a day (QID) | ORAL | 0 refills | Status: DC

## 2018-08-24 MED ORDER — DOXYCYCLINE HYCLATE 100 MG PO TABS
100.0000 mg | ORAL_TABLET | Freq: Once | ORAL | Status: AC
  Administered 2018-08-24: 100 mg via ORAL
  Filled 2018-08-24: qty 1

## 2018-08-24 MED ORDER — DOXYCYCLINE HYCLATE 100 MG PO CAPS: 100.0000 mg | ORAL_CAPSULE | Freq: Two times a day (BID) | ORAL | 0 refills | Status: DC

## 2018-08-24 NOTE — ED Provider Notes (Signed)
[facility]-EMERGENCY DEPT Provider Note   CSN: [id number] Arrival date & time: 08/24/18  1540    History   Chief Complaint Chief Complaint  Patient presents with  . Foot Pain    left    HPI [name] is a 58 y.o. male.     HPI  58 year old male comes in a chief complaint of foot pain.  He has history of liver cirrhosis, DI, peripheral neuropathy and CKD.   Patient reports that he started having bilateral feet pain last week.  Typically he gets pain like this over multiple joints when it rains, and is been quite wet the last few days.  Recently however he is started noticing increased swelling and pain in his left foot.  He denies any associated nausea, vomiting, fevers, chills.  Patient has taken over-the-counter medicine for pain control.   Patient denies any recent history of gout.  He states that about 15 years ago he had an episode when he was eating a lot of red meat.  He denies any history of recent red meat consumption or heavy alcohol use.  Past Medical History:  Diagnosis Date  . Anemia   . Ascites   . Chronic leg pain   . Cirrhosis (HCC)   . GERD (gastroesophageal reflux disease)   . H/O diabetes insipidus   . Hepatitis C    Completed therapy with Epclusa ~ 2018.  suspect therapy overseen by [facility] in [place], [place]  . History of adrenal insufficiency   . History of ARDS   . History of blood transfusion   . Hypothyroidism   . Kidney disease    stage 3  . Neuropathy   . Overdose 12/2009  . Peripheral neuropathy   . Personality disorder (HCC)   . PTSD (post-traumatic stress disorder)    SECONDARY TO WAR IN [place]  . Schizoaffective disorder [facility])     Patient Active Problem List   Diagnosis Date Noted  . GI bleed 08/07/2018  . Acute abdominal pain   . Cirrhosis of liver with ascites (HCC)   . Generalized abdominal pain   . Hematemesis with nausea   . Evaluation by psychiatric service required   . Acute  upper gastrointestinal bleeding 10/15/2017  . PTSD (post-traumatic stress disorder)   . Hypothyroidism   . History of adrenal insufficiency   . H/O diabetes insipidus   . Schizoaffective disorder (HCC) 11/08/2011  . Personality disorder (HCC)   . Anemia   . Ascites   . Liver disease   . Hepatitis C   . Kidney disease   . GERD (gastroesophageal reflux disease)   . Psychosis (HCC) 11/07/2011    Past Surgical History:  Procedure Laterality Date  . CERVICAL SPINE SURGERY  unknown  . RIGHT HIP SURGERY  1996  . US GUIDED LEFT THORACENTESIS  01/21/2010        Home Medications    Prior to Admission medications   Medication Sig Start Date End Date Taking? Authorizing Provider  [employer] D3 2000 units CAPS Take 2,000 Units by mouth daily. 10/23/17   [provider]  diphenhydrAMINE (BENADRYL) 25 MG tablet Take 25 mg by mouth at bedtime.     [provider]  famotidine (PEPCID) 20 MG tablet Take 20 mg by mouth at bedtime. 07/12/18   [provider]  fluPHENAZine (PROLIXIN) 5 MG tablet Take 5 mg by mouth 3 (three) times daily.    [provider]  furosemide (LASIX) 20 MG  tablet Take 20 mg by mouth daily.     [provider]  gabapentin (NEURONTIN) 100 MG capsule Take 200 mg by mouth 3 (three) times daily. 07/21/18   [provider]  LORazepam (ATIVAN) 0.5 MG tablet Take 0.5 mg by mouth every 8 (eight) hours as needed for anxiety.     [provider]  meloxicam (MOBIC) 15 MG tablet Take 15 mg by mouth daily. 07/06/18   [provider]  ondansetron (ZOFRAN ODT) 8 MG disintegrating tablet Take 1 tablet (8 mg total) by mouth every 8 (eight) hours as needed for nausea or vomiting. Patient not taking: Reported on 11/02/2017 11/14/15   Azalia Bilis, MD  oxyCODONE (OXY IR/ROXICODONE) 5 MG immediate release tablet Take 1 tablet (5 mg total) by mouth every 6 (six) hours as needed for severe pain. 09/08/17   Garlon Hatchet, PA-C   pantoprazole (PROTONIX) 40 MG tablet Take 1 tablet (40 mg total) by mouth 2 (two) times daily. 10/18/17   Simonne Martinet, NP  predniSONE (DELTASONE) 20 MG tablet Take 2 tablets (40 mg total) by mouth daily. 08/19/18   Ward, Chase Picket, PA-C  SENSIPAR 30 MG tablet Take 30 mg by mouth daily. 07/25/17   [provider]  trihexyphenidyl (ARTANE) 2 MG tablet Take 2 mg by mouth 3 (three) times daily.    [provider]    Family History Family History  Problem Relation Age of Onset  . Cancer Mother   . Heart attack Father     Social History Social History   Tobacco Use  . Smoking status: Current Every Day Smoker    Packs/day: 1.00    Years: 25.00    Pack years: 25.00    Types: Cigarettes  . Smokeless tobacco: Never Used  Substance Use Topics  . Alcohol use: No    Comment: Pt denies   . Drug use: No    Comment: Pt denies     Allergies   Patient has no known allergies.   Review of Systems Review of Systems  Constitutional: Positive for activity change. Negative for chills and fever.  Gastrointestinal: Negative for nausea and vomiting.  Skin: Positive for rash.  Allergic/Immunologic: Negative for immunocompromised state.  All other systems reviewed and are negative.    Physical Exam Updated Vital Signs BP 119/63 (BP Location: Left Arm)   Pulse 75   Temp 98.6 F (37 C) (Oral)   Resp 17   SpO2 99%   Physical Exam Vitals signs and nursing note reviewed.  Constitutional:      Appearance: He is well-developed.  HENT:     Head: Atraumatic.  Neck:     Musculoskeletal: Neck supple.  Cardiovascular:     Rate and Rhythm: Normal rate.  Pulmonary:     Effort: Pulmonary effort is normal.  Skin:    General: Skin is warm.     Findings: Rash present.     Comments: Patient's left foot is erythematous, edematous and warm to touch.  The erythema extends from the toes up to the proximal foot.  Patient is able to plantar and dorsiflex his ankle and toes  without significant difficulty.  There is pitting appreciated over the distal aspect of the foot.  No crepitus.  Tenderness to palpation is diffuse over the foot and includes the MTP and the toes  Neurological:     Mental Status: He is alert and oriented to person, place, and time.      ED Treatments /  Results  Labs (all labs ordered are listed, but only abnormal results are displayed) Labs Reviewed - No data to display  EKG None  Radiology No results found.  Procedures Procedures (including critical care time)  Medications Ordered in ED Medications  cephALEXin (KEFLEX) capsule 500 mg (has no administration in time range)  doxycycline (VIBRA-TABS) tablet 100 mg (has no administration in time range)  oxyCODONE-acetaminophen (PERCOCET/ROXICET) 5-325 MG per tablet 1 tablet (has no administration in time range)     Initial Impression / Assessment and Plan / ED Course  I have reviewed the triage vital signs and the nursing notes.  Pertinent labs & imaging results that were available during my care of the patient were reviewed by me and considered in my medical decision making (see chart for details).        58 year old comes in a chief complaint of foot pain. Although is complaining of bilateral feet pain, his primary discomfort is over the left foot, which appears erythematous, edematous and warm to touch.  The tenderness is diffuse.  Range of motion over the MTP and the ankle is normal.  Based on our evaluation it does not appear clinically that he has a septic arthritis.  Patient was seen just few days ago, x-rays at that time revealed some edema and bony erosion.  Although osteomyelitis is in the differential, it appears to us right now that the infection is more superficial and likely that patient has cellulitis.  There is no edema over the leg and we do not think this is a DVT.  The plan is for us to demarcate the rash.  Patient will be advised to return to the ER in 2 to  3 days for recheck.  We will start him on Keflex and doxycycline for presumed cellulitis.  Unfortunately he has been in and out of the hospital, which puts him at high risk of MRSA infection.  He will return to the ER sooner if he starts having any systemic symptoms or if the rash/edema spreads proximally.  The patient appears reasonably screened and/or stabilized for discharge and I doubt any other medical condition or other [facility] requiring further screening, evaluation, or treatment in the ED at this time prior to discharge.   Results from the ER workup discussed with the patient face to face and all questions answered to the best of my ability. The patient is safe for discharge with strict return precautions.    Final Clinical Impressions(s) / ED Diagnoses   Final diagnoses:  Cellulitis of foot    ED Discharge Orders    None       Derwood KaplanNanavati, Bonnee Zertuche, MD 08/24/18 [phone]

## 2018-08-24 NOTE — ED Notes (Signed)
Discharge instructions reviewed with patient. Patient verbalizes understanding. VSS.   Patient given skin marker. Patient cellulitis marked at [facility] 08/24/18. Patient instructed to mark the redness if it gets bigger, 5/28 night, and 5/29 morning and night. Patient verbalizes understanding.

## 2018-08-24 NOTE — ED Notes (Signed)
Bed: IN86 Expected date:  Expected time:  Means of arrival:  Comments: EMS-58 y/o foot pain/swelling

## 2018-08-24 NOTE — Discharge Instructions (Addendum)
We suspect that you have cellulitis.  Please take the antibiotics prescribed.  Return to the ER in 2 days for recheck.  Return to the ER sooner if you start having severe pain, the swelling or redness spreads above your ankle or if you start having fevers.

## 2018-08-24 NOTE — ED Triage Notes (Addendum)
Patient arrived via [facility] from home.   Patient c/o left foot pain, Big toe and top of foot swollen and warm to touch.   Pain has gotten worse over the last couple of days.   Denies falls or trauma.  Denies hx. Of gout.   A/Ox4 Ambulatory with cane per EMS.

## 2018-08-26 ENCOUNTER — Other Ambulatory Visit: Payer: Self-pay

## 2018-08-26 ENCOUNTER — Encounter (HOSPITAL_COMMUNITY): Payer: Self-pay | Admitting: Emergency Medicine

## 2018-08-26 ENCOUNTER — Emergency Department (HOSPITAL_COMMUNITY)
Admission: EM | Admit: 2018-08-26 | Discharge: 2018-08-26 | Disposition: A | Discharge status: Home / Self Care | Payer: Medicaid Other | Attending: Emergency Medicine | Admitting: Emergency Medicine

## 2018-08-26 ENCOUNTER — Emergency Department (HOSPITAL_COMMUNITY): Payer: Medicaid Other

## 2018-08-26 DIAGNOSIS — Z79899 Other long term (current) drug therapy: Secondary | ICD-10-CM | POA: Diagnosis not present

## 2018-08-26 DIAGNOSIS — E119 Type 2 diabetes mellitus without complications: Secondary | ICD-10-CM | POA: Insufficient documentation

## 2018-08-26 DIAGNOSIS — F17210 Nicotine dependence, cigarettes, uncomplicated: Secondary | ICD-10-CM | POA: Insufficient documentation

## 2018-08-26 DIAGNOSIS — M79672 Pain in left foot: Secondary | ICD-10-CM | POA: Diagnosis present

## 2018-08-26 DIAGNOSIS — M109 Gout, unspecified: Secondary | ICD-10-CM | POA: Insufficient documentation

## 2018-08-26 DIAGNOSIS — Z5189 Encounter for other specified aftercare: Secondary | ICD-10-CM

## 2018-08-26 DIAGNOSIS — E039 Hypothyroidism, unspecified: Secondary | ICD-10-CM | POA: Insufficient documentation

## 2018-08-26 LAB — CBC WITH DIFFERENTIAL/PLATELET
Abs Immature Granulocytes: 0.03 10*3/uL (ref 0.00–0.07)
Basophils Absolute: 0 10*3/uL (ref 0.0–0.1)
Basophils Relative: 1 %
Eosinophils Absolute: 0.4 10*3/uL (ref 0.0–0.5)
Eosinophils Relative: 6 %
HCT: 29.8 % — ABNORMAL LOW (ref 39.0–52.0)
Hemoglobin: 8.5 g/dL — ABNORMAL LOW (ref 13.0–17.0)
Immature Granulocytes: 1 %
Lymphocytes Relative: 9 %
Lymphs Abs: 0.6 10*3/uL — ABNORMAL LOW (ref 0.7–4.0)
MCH: 25.2 pg — ABNORMAL LOW (ref 26.0–34.0)
MCHC: 28.5 g/dL — ABNORMAL LOW (ref 30.0–36.0)
MCV: 88.4 fL (ref 80.0–100.0)
Monocytes Absolute: 0.5 10*3/uL (ref 0.1–1.0)
Monocytes Relative: 9 %
Neutro Abs: 4.7 10*3/uL (ref 1.7–7.7)
Neutrophils Relative %: 74 %
Platelets: 250 10*3/uL (ref 150–400)
RBC: 3.37 MIL/uL — ABNORMAL LOW (ref 4.22–5.81)
RDW: 27.2 % — ABNORMAL HIGH (ref 11.5–15.5)
WBC: 6.3 10*3/uL (ref 4.0–10.5)
nRBC: 0 % (ref 0.0–0.2)

## 2018-08-26 LAB — BASIC METABOLIC PANEL
Anion gap: 9 (ref 5–15)
BUN: 16 mg/dL (ref 6–20)
CO2: 23 mmol/L (ref 22–32)
Calcium: 8.3 mg/dL — ABNORMAL LOW (ref 8.9–10.3)
Chloride: 103 mmol/L (ref 98–111)
Creatinine, Ser: 1.79 mg/dL — ABNORMAL HIGH (ref 0.61–1.24)
GFR calc Af Amer: 47 mL/min — ABNORMAL LOW (ref >60)
GFR calc non Af Amer: 41 mL/min — ABNORMAL LOW (ref >60)
Glucose, Bld: 100 mg/dL — ABNORMAL HIGH (ref 70–99)
Potassium: 3.4 mmol/L — ABNORMAL LOW (ref 3.5–5.1)
Sodium: 135 mmol/L (ref 135–145)

## 2018-08-26 MED ORDER — PREDNISONE 10 MG PO TABS: 40.0000 mg | ORAL_TABLET | Freq: Every day | ORAL | 0 refills | Status: AC

## 2018-08-26 MED ORDER — OXYCODONE-ACETAMINOPHEN 5-325 MG PO TABS
1.0000 | ORAL_TABLET | Freq: Once | ORAL | Status: AC
  Administered 2018-08-26: 1 via ORAL
  Filled 2018-08-26: qty 1

## 2018-08-26 MED ORDER — COLCHICINE 0.6 MG PO TABS: 0.6000 mg | ORAL_TABLET | Freq: Every day | ORAL | 0 refills | Status: DC

## 2018-08-26 NOTE — Discharge Instructions (Signed)
You were seen in the ED today for a wound check; your xray showed what looks like gout in your foot. Please take the medications that I have prescribed to you today. Continue taking the antibiotics as well. Please follow up with your PCP. Return to the ED for any worsening symptoms including spreading of the redness, worsening pain, or if you develop fevers > 100.4 at home.

## 2018-08-26 NOTE — ED Provider Notes (Signed)
[facility]-EMERGENCY DEPT Provider Note   CSN: [id number] Arrival date & time: 08/26/18  [id number]    History   Chief Complaint Chief Complaint  Patient presents with  . Foot Pain  . Wound Check    HPI Benjamin Pulsednan Hull is a 58 y.o. male who presents to the ED for wound check. Pt was seen on 05/23 and 05/28 for bilateral foot pain. At first visit he had x rays done which showed soft tissue edema but no signs of osteomyelitis. He was given steroids and discharged home. Pt was seen again on 05/28 and given Keflex and Doxy for possible cellulitis; area of erythema was marked with surgical pen and patient told to follow up in 2-3 days. He returns today for wound check; states the erythema has gotten worse; he has marked the areas himself to show evolution of redness. Pt also complaining of pain; reports his pain has mildly improved. Denies fever, chills, drainage, or any other associated symptoms.       Past Medical History:  Diagnosis Date  . Anemia   . Ascites   . Chronic leg pain   . Cirrhosis (HCC)   . GERD (gastroesophageal reflux disease)   . H/O diabetes insipidus   . Hepatitis C    Completed therapy with Epclusa ~ 2018.  suspect therapy [facility] [place]  . History of adrenal insufficiency   . History of ARDS   . History of blood transfusion   . Hypothyroidism   . Kidney disease    stage 3  . Neuropathy   . Overdose 12/2009  . Peripheral neuropathy   . Personality disorder (HCC)   . PTSD (post-traumatic stress disorder)    [place]CON[place]  . Schizoaffective disorder [facility](HCC)     Patient Active Problem List   Diagnosis Date Noted  . GI bleed 08/07/2018  . Acute abdominal pain   . Cirrhosis of liver with ascites (HCC)   . Generalized abdominal pain   . Hematemesis with nausea   . Evaluation by psychiatric service required   . Acute upper gastrointestinal bleeding 10/15/2017  . PTSD (post-traumatic stress  disorder)   . Hypothyroidism   . History of adrenal insufficiency   . H/O diabetes insipidus   . Schizoaffective disorder (HCC) 11/08/2011  . Personality disorder (HCC)   . Anemia   . Ascites   . Liver disease   . Hepatitis C   . Kidney disease   . GERD (gastroesophageal reflux disease)   . Psychosis (HCC) 11/07/2011    Past Surgical History:  Procedure Laterality Date  . CERVICAL SPINE SURGERY  unknown  . RIGHT HIP SURGERY  1996  . US GUIDED LEFT THORACENTESIS  01/21/2010        Home Medications    Prior to Admission medications   Medication Sig Start Date End Date Taking? Authorizing Provider  cephALEXin (KEFLEX) 500 MG capsule Take 1 capsule (500 mg total) by mouth 4 (four) times daily. 08/24/18  Yes Derwood KaplanNanavati, Ankit, MD  [employer] D3 2000 units CAPS Take 2,000 Units by mouth daily. 10/23/17  Yes [provider]  diphenhydrAMINE (BENADRYL) 25 MG tablet Take 25 mg by mouth at bedtime.    Yes [provider]  doxycycline (VIBRAMYCIN) 100 MG capsule Take 1 capsule (100 mg total) by mouth 2 (two) times daily. 08/24/18  Yes Derwood KaplanNanavati, Ankit, MD  famotidine (PEPCID) 20 MG tablet Take 20 mg by mouth at bedtime. 07/12/18  Yes [provider]  fluPHENAZine (PROLIXIN) 5 MG tablet Take 5 mg by mouth 3 (three) times daily.   Yes [provider]  furosemide (LASIX) 20 MG tablet Take 20 mg by mouth daily.    Yes [provider]  gabapentin (NEURONTIN) 100 MG capsule Take 200 mg by mouth 3 (three) times daily. 07/21/18  Yes [provider]  LORazepam (ATIVAN) 0.5 MG tablet Take 0.5 mg by mouth every 8 (eight) hours as needed for anxiety.    Yes [provider]  meloxicam (MOBIC) 15 MG tablet Take 15 mg by mouth daily. 07/06/18  Yes [provider]  oxyCODONE (OXY IR/ROXICODONE) 5 MG immediate release tablet Take 1 tablet (5 mg total) by mouth every 6 (six) hours as needed for severe pain. 09/08/17  Yes Garlon Hatchet, PA-C   pantoprazole (PROTONIX) 40 MG tablet Take 1 tablet (40 mg total) by mouth 2 (two) times daily. 10/18/17  Yes Simonne Martinet, NP  SENSIPAR 30 MG tablet Take 30 mg by mouth daily. 07/25/17  Yes [provider]  trihexyphenidyl (ARTANE) 2 MG tablet Take 2 mg by mouth 3 (three) times daily.   Yes [provider]  colchicine 0.6 MG tablet Take 1 tablet (0.6 mg total) by mouth daily. 08/26/18   Madsen Riddle, PA-C  ondansetron (ZOFRAN ODT) 8 MG disintegrating tablet Take 1 tablet (8 mg total) by mouth every 8 (eight) hours as needed for nausea or vomiting. Patient not taking: Reported on 11/02/2017 11/14/15   Azalia Bilis, MD  predniSONE (DELTASONE) 10 MG tablet Take 4 tablets (40 mg total) by mouth daily for 5 days. 08/26/18 08/31/18  Tanda Rockers, PA-C    Family History Family History  Problem Relation Age of Onset  . Cancer Mother   . Heart attack Father     Social History Social History   Tobacco Use  . Smoking status: Current Every Day Smoker    Packs/day: 1.00    Years: 25.00    Pack years: 25.00    Types: Cigarettes  . Smokeless tobacco: Never Used  Substance Use Topics  . Alcohol use: No    Comment: Pt denies   . Drug use: No    Comment: Pt denies     Allergies   Patient has no known allergies.   Review of Systems Review of Systems  Constitutional: Negative for chills and fever.  HENT: Negative for congestion.   Eyes: Negative for redness.  Respiratory: Negative for cough and shortness of breath.   Cardiovascular: Negative for chest pain.  Gastrointestinal: Negative for abdominal pain, nausea and vomiting.  Genitourinary: Negative for difficulty urinating.  Musculoskeletal: Positive for arthralgias and joint swelling.  Skin: Positive for color change.  Neurological: Negative for weakness and numbness.     Physical Exam Updated Vital Signs BP 133/75 (BP Location: Right Arm)   Pulse 80   Temp 98.8 F (37.1 C) (Oral)   Resp 16   SpO2 100%    Physical Exam Vitals signs and nursing note reviewed.  Constitutional:      Appearance: He is not ill-appearing.  HENT:     Head: Normocephalic and atraumatic.  Eyes:     Conjunctiva/sclera: Conjunctivae normal.  Neck:     Musculoskeletal: Neck supple.  Cardiovascular:     Rate and Rhythm: Normal rate and regular rhythm.  Pulmonary:     Effort: Pulmonary effort is normal.     Breath sounds: Normal breath sounds.  Abdominal:  Palpations: Abdomen is soft.     Tenderness: There is no abdominal tenderness.  Musculoskeletal:     Comments: Erythema and edema overlying dorsum of left foot; worse in great toe and extends to proximal MTP joints; tenderness to palpation; no tenderness with ROM of great toe; Able to dorsiflex and plantarflex without difficulty; 2+ DP pulse  Skin:    General: Skin is warm and dry.  Neurological:     Mental Status: He is alert.      ED Treatments / Results  Labs (all labs ordered are listed, but only abnormal results are displayed) Labs Reviewed  BASIC METABOLIC PANEL - Abnormal; Notable for the following components:      Result Value   Potassium 3.4 (*)    Glucose, Bld 100 (*)    Creatinine, Ser 1.79 (*)    Calcium 8.3 (*)    GFR calc non Af Amer 41 (*)    GFR calc Af Amer 47 (*)    All other components within normal limits  CBC WITH DIFFERENTIAL/PLATELET - Abnormal; Notable for the following components:   RBC 3.37 (*)    Hemoglobin 8.5 (*)    HCT 29.8 (*)    MCH 25.2 (*)    MCHC 28.5 (*)    RDW 27.2 (*)    Lymphs Abs 0.6 (*)    All other components within normal limits    EKG None  Radiology Dg Foot Complete Left  Result Date: 08/26/2018 CLINICAL DATA:  Left foot pain. Third through fifth rays cellulitis. EXAM: LEFT FOOT - COMPLETE 3+ VIEW COMPARISON:  08/19/2018 left foot radiographs FINDINGS: No fracture or dislocation. Healed deformity in the second metatarsal. Distal left foot soft tissue swelling. Stable juxta-articular  erosion with overhanging edge in medial distal left first metatarsal. No interval osseous erosions. No radiopaque foreign body. No tophaceous soft tissue calcifications. Diffuse osteopenia. IMPRESSION: 1. Chronic appearing gouty erosion in the medial distal left first metatarsal. No interval osseous erosions. 2. Distal left foot soft tissue swelling. No tophaceous soft tissue calcifications. 3. Diffuse osteopenia. Electronically Signed   By: Delbert Phenix M.D.   On: 08/26/2018 11:46    Procedures Procedures (including critical care time)  Medications Ordered in ED Medications  oxyCODONE-acetaminophen (PERCOCET/ROXICET) 5-325 MG per tablet 1 tablet (1 tablet Oral Given 08/26/18 1059)     Initial Impression / Assessment and Plan / ED Course  I have reviewed the triage vital signs and the nursing notes.  Pertinent labs & imaging results that were available during my care of the patient were reviewed by me and considered in my medical decision making (see chart for details).    Pt is a 58 year old male presenting for wound check of left foot. Was seen 05/23 and treated with prednisone for possible gout. Seen 2 days ago for same pain; worse on left; given Keflex and Doxy for possible cellulitis of left foot. Told to return for wound check. Will get repeat xray of left foot today to check for osteomyelitis as well as CBC and BMP. Pain medication provided in the ED today.   Xrays with findings of gouty erosions; Dr. Judd Lien attending physician evaluated the patient as well and agrees this appears to be gout. No leukocytosis to suggest infectious process and pt is afebrile in the ED today. Hgb stable from previous admission last week for blood transfusion. When updating patient he reports he never picked up the prednisone that was prescribed to him on 05/23; will prescribe this  again as well as colchicine for patient to take. He is in agreement with plan at this time and stable for discharge home. Have  advised to follow up with PCP and counseled on foods to avoid in the future.          Final Clinical Impressions(s) / ED Diagnoses   Final diagnoses:  Visit for wound check  Acute gout of left foot, unspecified cause    ED Discharge Orders         Ordered    colchicine 0.6 MG tablet  Daily     08/26/18 1212    predniSONE (DELTASONE) 10 MG tablet  Daily     08/26/18 [address]1212           Cythnia Osmun, PA-C 08/27/18 [id number]    Geoffery Lyonselo, Douglas, MD 08/27/18 [id number]

## 2018-08-26 NOTE — ED Notes (Signed)
Patient transported to X-ray 

## 2018-08-26 NOTE — ED Triage Notes (Signed)
Pt reports that he was told to come back for doctor appt today to recheck his left foot. Reports is painful when walks on it. States is taking his antibiotics.

## 2018-10-01 ENCOUNTER — Emergency Department (HOSPITAL_COMMUNITY)
Admission: EM | Admit: 2018-10-01 | Discharge: 2018-10-01 | Disposition: A | Discharge status: Home / Self Care | Payer: Medicaid Other | Attending: Emergency Medicine | Admitting: Emergency Medicine

## 2018-10-01 ENCOUNTER — Encounter (HOSPITAL_COMMUNITY): Payer: Self-pay

## 2018-10-01 ENCOUNTER — Other Ambulatory Visit: Payer: Self-pay

## 2018-10-01 DIAGNOSIS — M79604 Pain in right leg: Secondary | ICD-10-CM | POA: Diagnosis present

## 2018-10-01 DIAGNOSIS — Z79899 Other long term (current) drug therapy: Secondary | ICD-10-CM | POA: Insufficient documentation

## 2018-10-01 DIAGNOSIS — E039 Hypothyroidism, unspecified: Secondary | ICD-10-CM | POA: Diagnosis not present

## 2018-10-01 DIAGNOSIS — M79605 Pain in left leg: Secondary | ICD-10-CM | POA: Insufficient documentation

## 2018-10-01 DIAGNOSIS — N183 Chronic kidney disease, stage 3 (moderate): Secondary | ICD-10-CM | POA: Insufficient documentation

## 2018-10-01 DIAGNOSIS — F17210 Nicotine dependence, cigarettes, uncomplicated: Secondary | ICD-10-CM | POA: Diagnosis not present

## 2018-10-01 DIAGNOSIS — E1122 Type 2 diabetes mellitus with diabetic chronic kidney disease: Secondary | ICD-10-CM | POA: Insufficient documentation

## 2018-10-01 MED ORDER — KETOROLAC TROMETHAMINE 30 MG/ML IJ SOLN
30.0000 mg | Freq: Once | INTRAMUSCULAR | Status: AC
  Administered 2018-10-01: 03:00:00 30 mg via INTRAMUSCULAR
  Filled 2018-10-01: qty 1

## 2018-10-01 MED ORDER — LIDOCAINE 5 % EX PTCH
2.0000 | MEDICATED_PATCH | CUTANEOUS | Status: DC
  Administered 2018-10-01: 03:00:00 2 via TRANSDERMAL
  Filled 2018-10-01: qty 2

## 2018-10-01 MED ORDER — DICLOFENAC SODIUM 1 % TD GEL: 4.0000 g | Freq: Four times a day (QID) | TRANSDERMAL | 0 refills | Status: DC

## 2018-10-01 NOTE — ED Triage Notes (Signed)
Pt states that he has increased pain in all extremities due to arthritis. He reports decreased mobility. Pt denies any recent falls. Pt states he has taken tylenol with no relief.

## 2018-10-01 NOTE — ED Provider Notes (Signed)
[facility] DEPT Provider Note   CSN: [id number] Arrival date & time: 10/01/18  0057     History   Chief Complaint Chief Complaint  Patient presents with  . Arthritis pain    HPI [name] is a 58 y.o. male.     The history is provided by the patient.  Extremity Pain This is a chronic problem. The current episode started more than 1 week ago. The problem occurs constantly. The problem has not changed since onset.Pertinent negatives include no chest pain, no abdominal pain, no headaches and no shortness of breath. Nothing aggravates the symptoms. Nothing relieves the symptoms. He has tried nothing for the symptoms. The treatment provided no relief.  Has chronic arthritis and extremity pain but missed his appointment with pain management and now has nothing.  No f/c/r.  No weakness or numbness.  No swelling.  No skin changes.    Past Medical History:  Diagnosis Date  . Anemia   . Ascites   . Chronic leg pain   . Cirrhosis ([place])   . GERD (gastroesophageal reflux disease)   . H/O diabetes insipidus   . Hepatitis C    Completed therapy with Epclusa ~ 2018.  suspect therapy overseen by [facility] in [place], [place]  . History of adrenal insufficiency   . History of ARDS   . History of blood transfusion   . Hypothyroidism   . Kidney disease    stage 3  . Neuropathy   . Overdose 12/2009  . Peripheral neuropathy   . Personality disorder ([place])   . PTSD (post-traumatic stress disorder)    SECONDARY TO WAR IN [place]  . Schizoaffective disorder [facility])     Patient Active Problem List   Diagnosis Date Noted  . GI bleed 08/07/2018  . Acute abdominal pain   . Cirrhosis of liver with ascites ([place])   . Generalized abdominal pain   . Hematemesis with nausea   . Evaluation by psychiatric service required   . Acute upper gastrointestinal bleeding 10/15/2017  . PTSD (post-traumatic stress disorder)   . Hypothyroidism   . History of  adrenal insufficiency   . H/O diabetes insipidus   . Schizoaffective disorder ([place]) 11/08/2011  . Personality disorder ([place])   . Anemia   . Ascites   . Liver disease   . Hepatitis C   . Kidney disease   . GERD (gastroesophageal reflux disease)   . Psychosis ([place]) 11/07/2011    Past Surgical History:  Procedure Laterality Date  . CERVICAL SPINE SURGERY  unknown  . RIGHT HIP SURGERY  1996  . US GUIDED LEFT THORACENTESIS  01/21/2010        Home Medications    Prior to Admission medications   Medication Sig Start Date End Date Taking? Authorizing Provider  colchicine 0.6 MG tablet Take 1 tablet (0.6 mg total) by mouth daily. 08/26/18  Yes Venter, Margaux, PA-C  [employer] D3 2000 units CAPS Take 2,000 Units by mouth daily. 10/23/17  Yes [provider]  fluPHENAZine (PROLIXIN) 5 MG tablet Take 5 mg by mouth 3 (three) times daily.   Yes [provider]  furosemide (LASIX) 20 MG tablet Take 20 mg by mouth daily.    Yes [provider]  gabapentin (NEURONTIN) 100 MG capsule Take 200 mg by mouth 3 (three) times daily. 07/21/18  Yes [provider]  LORazepam (ATIVAN) 0.5 MG tablet Take 0.5 mg by mouth every 8 (eight) hours as needed for  anxiety.    Yes [provider]  SENSIPAR 30 MG tablet Take 30 mg by mouth daily. 07/25/17  Yes [provider]  trihexyphenidyl (ARTANE) 2 MG tablet Take 2 mg by mouth 3 (three) times daily.   Yes [provider]  cephALEXin (KEFLEX) 500 MG capsule Take 1 capsule (500 mg total) by mouth 4 (four) times daily. Patient not taking: Reported on 10/01/2018 08/24/18   Derwood Kaplan, MD  diphenhydrAMINE (BENADRYL) 25 MG tablet Take 25 mg by mouth at bedtime.     [provider]  doxycycline (VIBRAMYCIN) 100 MG capsule Take 1 capsule (100 mg total) by mouth 2 (two) times daily. Patient not taking: Reported on 10/01/2018 08/24/18   Derwood Kaplan, MD  meloxicam (MOBIC) 15 MG tablet Take 15 mg by mouth  daily. 07/06/18   [provider]  ondansetron (ZOFRAN ODT) 8 MG disintegrating tablet Take 1 tablet (8 mg total) by mouth every 8 (eight) hours as needed for nausea or vomiting. Patient not taking: Reported on 11/02/2017 11/14/15   Azalia Bilis, MD  oxyCODONE (OXY IR/ROXICODONE) 5 MG immediate release tablet Take 1 tablet (5 mg total) by mouth every 6 (six) hours as needed for severe pain. Patient not taking: Reported on 10/01/2018 09/08/17   Garlon Hatchet, PA-C  pantoprazole (PROTONIX) 40 MG tablet Take 1 tablet (40 mg total) by mouth 2 (two) times daily. Patient not taking: Reported on 10/01/2018 10/18/17   Simonne Martinet, NP    Family History Family History  Problem Relation Age of Onset  . Cancer Mother   . Heart attack Father     Social History Social History   Tobacco Use  . Smoking status: Current Every Day Smoker    Packs/day: 1.00    Years: 25.00    Pack years: 25.00    Types: Cigarettes  . Smokeless tobacco: Never Used  Substance Use Topics  . Alcohol use: No    Comment: Pt denies   . Drug use: No    Comment: Pt denies     Allergies   Patient has no known allergies.   Review of Systems Review of Systems  Constitutional: Negative for fever.  Respiratory: Negative for cough and shortness of breath.   Cardiovascular: Negative for chest pain, palpitations and leg swelling.  Gastrointestinal: Negative for abdominal pain.  Musculoskeletal: Positive for arthralgias. Negative for gait problem, joint swelling, myalgias, neck pain and neck stiffness.  Neurological: Negative for weakness, numbness and headaches.  All other systems reviewed and are negative.    Physical Exam Updated Vital Signs BP 96/64 (BP Location: Left Arm)   Pulse 70   Temp 98.4 F (36.9 C) (Oral)   Resp 18   Ht 6\' 6"  (1.981 m)   Wt 104.3 kg   SpO2 100%   BMI 26.58 kg/m   Physical Exam Vitals signs and nursing note reviewed.  Constitutional:      General: He is not in acute  distress.    Appearance: He is normal weight.  HENT:     Head: Normocephalic and atraumatic.     Nose: Nose normal.  Eyes:     Conjunctiva/sclera: Conjunctivae normal.     Pupils: Pupils are equal, round, and reactive to light.  Neck:     Musculoskeletal: Normal range of motion and neck supple.  Cardiovascular:     Rate and Rhythm: Normal rate and regular rhythm.     Pulses: Normal pulses.     Heart sounds: Normal heart  sounds.  Pulmonary:     Effort: Pulmonary effort is normal.     Breath sounds: Normal breath sounds.  Abdominal:     General: Abdomen is flat.     Tenderness: There is no abdominal tenderness. There is no guarding.  Musculoskeletal: Normal range of motion.        General: No tenderness.     Right hip: Normal.     Left hip: Normal.     Right knee: Normal.     Left knee: Normal.     Right ankle: Normal.     Left ankle: Normal. Achilles tendon normal.     Right foot: Normal.     Left foot: Normal.  Skin:    General: Skin is warm and dry.     Capillary Refill: Capillary refill takes less than 2 seconds.     Coloration: Skin is not pale.  Neurological:     General: No focal deficit present.     Mental Status: He is alert and oriented to person, place, and time.     Sensory: No sensory deficit.     Coordination: Coordination normal.     Deep Tendon Reflexes: Reflexes normal.  Psychiatric:        Mood and Affect: Mood normal.        Behavior: Behavior normal.      ED Treatments / Results  Labs (all labs ordered are listed, but only abnormal results are displayed) Labs Reviewed - No data to display  EKG None  Radiology No results found.  Procedures Procedures (including critical care time)  Medications Ordered in ED [place]  Lidoderm patches    Final Clinical Impressions(s) / ED Diagnoses   Return for intractable cough, coughing up blood,fevers >100.4 unrelieved by medication, shortness of breath, intractable vomiting, chest pain,  shortness of breath, weakness,numbness, changes in speech, facial asymmetry,abdominal pain, passing out,Inability to tolerate liquids or food, cough, altered mental status or any concerns. No signs of systemic illness or infection. The patient is nontoxic-appearing on exam and vital signs are within normal limits.   I have reviewed the triage vital signs and the nursing notes. Pertinent labs &imaging results that were available during my care of the patient were reviewed by me and considered in my medical decision making (see chart for details).  After history, exam, and medical workup I feel the patient has been appropriately medically screened and is safe for discharge home. Pertinent diagnoses were discussed with the patient. Patient was given return precautions   Rillie Riffel, MD 10/01/18 2233

## 2018-10-01 NOTE — ED Notes (Signed)
Bed: WTR6 Expected date:  Expected time:  Means of arrival:  Comments: 

## 2018-10-01 NOTE — ED Notes (Signed)
Taxi called for pt per request

## 2018-10-03 ENCOUNTER — Other Ambulatory Visit: Payer: Self-pay | Admitting: Nurse Practitioner

## 2018-10-03 DIAGNOSIS — K7469 Other cirrhosis of liver: Secondary | ICD-10-CM

## 2018-10-12 ENCOUNTER — Other Ambulatory Visit: Payer: Medicaid Other

## 2018-10-14 ENCOUNTER — Emergency Department (HOSPITAL_COMMUNITY)
Admission: EM | Admit: 2018-10-14 | Discharge: 2018-10-14 | Disposition: A | Discharge status: Home / Self Care | Payer: Medicaid Other | Attending: Emergency Medicine | Admitting: Emergency Medicine

## 2018-10-14 ENCOUNTER — Encounter (HOSPITAL_COMMUNITY): Payer: Self-pay | Admitting: Emergency Medicine

## 2018-10-14 ENCOUNTER — Other Ambulatory Visit: Payer: Self-pay

## 2018-10-14 DIAGNOSIS — G894 Chronic pain syndrome: Secondary | ICD-10-CM | POA: Diagnosis not present

## 2018-10-14 DIAGNOSIS — N183 Chronic kidney disease, stage 3 (moderate): Secondary | ICD-10-CM | POA: Diagnosis not present

## 2018-10-14 DIAGNOSIS — M2550 Pain in unspecified joint: Secondary | ICD-10-CM | POA: Diagnosis present

## 2018-10-14 DIAGNOSIS — F17210 Nicotine dependence, cigarettes, uncomplicated: Secondary | ICD-10-CM | POA: Insufficient documentation

## 2018-10-14 DIAGNOSIS — Z79899 Other long term (current) drug therapy: Secondary | ICD-10-CM | POA: Diagnosis not present

## 2018-10-14 DIAGNOSIS — E039 Hypothyroidism, unspecified: Secondary | ICD-10-CM | POA: Diagnosis not present

## 2018-10-14 MED ORDER — OXYCODONE-ACETAMINOPHEN 5-325 MG PO TABS
2.0000 | ORAL_TABLET | Freq: Once | ORAL | Status: AC
  Administered 2018-10-14: 22:00:00 2 via ORAL
  Filled 2018-10-14: qty 2

## 2018-10-14 NOTE — Discharge Instructions (Addendum)
Please follow up with your Pain Specialist

## 2018-10-14 NOTE — ED Provider Notes (Signed)
[facility]-EMERGENCY DEPT Provider Note   CSN: [id number] Arrival date & time: 10/14/18  2135     History   Chief Complaint Chief Complaint  Patient presents with  . Medication Refill    HPI [name] is a 58 y.o. male.     HPI Patient is on chronic opioid pain medication reports that he was unable to follow-up with his doctor last week.  He is requesting a dose of 10 mg of oxycodone at this time.  He understands that he cannot receive a prescription from the ER.  He states he has pain diffusely through the majority of his joints secondary to arthritis.   Past Medical History:  Diagnosis Date  . Anemia   . Ascites   . Chronic leg pain   . Cirrhosis (HCC)   . GERD (gastroesophageal reflux disease)   . H/O diabetes insipidus   . Hepatitis C    Completed therapy with Epclusa ~ 2018.  suspect therapy overseen by [facility] in [place], [place]  . History of adrenal insufficiency   . History of ARDS   . History of blood transfusion   . Hypothyroidism   . Kidney disease    stage 3  . Neuropathy   . Overdose 12/2009  . Peripheral neuropathy   . Personality disorder (HCC)   . PTSD (post-traumatic stress disorder)    SECONDARY TO WAR IN [place]  . Schizoaffective disorder [facility])     Patient Active Problem List   Diagnosis Date Noted  . GI bleed 08/07/2018  . Acute abdominal pain   . Cirrhosis of liver with ascites (HCC)   . Generalized abdominal pain   . Hematemesis with nausea   . Evaluation by psychiatric service required   . Acute upper gastrointestinal bleeding 10/15/2017  . PTSD (post-traumatic stress disorder)   . Hypothyroidism   . History of adrenal insufficiency   . H/O diabetes insipidus   . Schizoaffective disorder (HCC) 11/08/2011  . Personality disorder (HCC)   . Anemia   . Ascites   . Liver disease   . Hepatitis C   . Kidney disease   . GERD (gastroesophageal reflux disease)   . Psychosis (HCC) 11/07/2011    Past Surgical History:  Procedure Laterality Date  . CERVICAL SPINE SURGERY  unknown  . RIGHT HIP SURGERY  1996  . US GUIDED LEFT THORACENTESIS  01/21/2010        Home Medications    Prior to Admission medications   Medication Sig Start Date End Date Taking? Authorizing Provider  cephALEXin (KEFLEX) 500 MG capsule Take 1 capsule (500 mg total) by mouth 4 (four) times daily. Patient not taking: Reported on 10/01/2018 08/24/18   Derwood Kaplan, MD  colchicine 0.6 MG tablet Take 1 tablet (0.6 mg total) by mouth daily. 08/26/18   Tanda Rockers, PA-C  [employer] D3 2000 units CAPS Take 2,000 Units by mouth daily. 10/23/17   [provider]  diclofenac sodium (VOLTAREN) 1 % GEL Apply 4 g topically 4 (four) times daily. 10/01/18   Palumbo, April, MD  diphenhydrAMINE (BENADRYL) 25 MG tablet Take 25 mg by mouth at bedtime.     [provider]  doxycycline (VIBRAMYCIN) 100 MG capsule Take 1 capsule (100 mg total) by mouth 2 (two) times daily. Patient not taking: Reported on 10/01/2018 08/24/18   Derwood Kaplan, MD  fluPHENAZine (PROLIXIN) 5 MG tablet Take 5 mg by mouth 3 (three) times daily.    [provider]  furosemide (LASIX) 20 MG tablet Take 20 mg by mouth daily.     [provider]  gabapentin (NEURONTIN) 100 MG capsule Take 200 mg by mouth 3 (three) times daily. 07/21/18   [provider]  LORazepam (ATIVAN) 0.5 MG tablet Take 0.5 mg by mouth every 8 (eight) hours as needed for anxiety.     [provider]  meloxicam (MOBIC) 15 MG tablet Take 15 mg by mouth daily. 07/06/18   [provider]  ondansetron (ZOFRAN ODT) 8 MG disintegrating tablet Take 1 tablet (8 mg total) by mouth every 8 (eight) hours as needed for nausea or vomiting. Patient not taking: Reported on 11/02/2017 11/14/15   Azalia Bilis, MD  oxyCODONE (OXY IR/ROXICODONE) 5 MG immediate release tablet Take 1 tablet (5 mg total) by mouth every 6 (six) hours as needed for severe pain.  Patient not taking: Reported on 10/01/2018 09/08/17   Garlon Hatchet, PA-C  pantoprazole (PROTONIX) 40 MG tablet Take 1 tablet (40 mg total) by mouth 2 (two) times daily. Patient not taking: Reported on 10/01/2018 10/18/17   Simonne Martinet, NP  SENSIPAR 30 MG tablet Take 30 mg by mouth daily. 07/25/17   [provider]  trihexyphenidyl (ARTANE) 2 MG tablet Take 2 mg by mouth 3 (three) times daily.    [provider]    Family History Family History  Problem Relation Age of Onset  . Cancer Mother   . Heart attack Father     Social History Social History   Tobacco Use  . Smoking status: Current Every Day Smoker    Packs/day: 1.00    Years: 25.00    Pack years: 25.00    Types: Cigarettes  . Smokeless tobacco: Never Used  Substance Use Topics  . Alcohol use: No    Comment: Pt denies   . Drug use: No    Comment: Pt denies     Allergies   Patient has no known allergies.   Review of Systems Review of Systems  All other systems reviewed and are negative.    Physical Exam Updated Vital Signs BP 112/84   Pulse 73   Temp 99.3 F (37.4 C) (Oral)   Resp 18   SpO2 98%   Physical Exam Vitals signs and nursing note reviewed.  Constitutional:      Appearance: He is well-developed.  HENT:     Head: Normocephalic.  Neck:     Musculoskeletal: Normal range of motion.  Pulmonary:     Effort: Pulmonary effort is normal.  Abdominal:     General: There is no distension.  Musculoskeletal: Normal range of motion.  Neurological:     Mental Status: He is alert and oriented to person, place, and time.      ED Treatments / Results  Labs (all labs ordered are listed, but only abnormal results are displayed) Labs Reviewed - No data to display  EKG None  Radiology No results found.  Procedures Procedures (including critical care time)  Medications Ordered in ED Medications  oxyCODONE-acetaminophen (PERCOCET/ROXICET) 5-325 MG per tablet 2 tablet (has  no administration in time range)     Initial Impression / Assessment and Plan / ED Course  I have reviewed the triage vital signs and the nursing notes.  Pertinent labs & imaging results that were available during my care of the patient were reviewed by me and considered in my medical decision making (see chart for details).        Oxycodone  given in the emergency department at his home dose.  He will follow-up with his pain specialist.  Final Clinical Impressions(s) / ED Diagnoses   Final diagnoses:  Chronic pain syndrome    ED Discharge Orders    None       Jola Schmidt, MD 10/14/18 2214

## 2018-10-14 NOTE — ED Triage Notes (Addendum)
Per EMS, patient from home, c/o pain all over x1 day. Hx arthritis. Patient reports he is sad because his mother died. Ambulatory with cane.   Patient adds he missed appointment with "pain doctor" and is out of pain medications.

## 2018-10-31 ENCOUNTER — Encounter (HOSPITAL_COMMUNITY): Payer: Self-pay

## 2018-10-31 ENCOUNTER — Emergency Department (HOSPITAL_COMMUNITY)
Admission: EM | Admit: 2018-10-31 | Discharge: 2018-10-31 | Disposition: A | Discharge status: Home / Self Care | Payer: Medicaid Other | Attending: Emergency Medicine | Admitting: Emergency Medicine

## 2018-10-31 ENCOUNTER — Other Ambulatory Visit: Payer: Self-pay

## 2018-10-31 DIAGNOSIS — F4310 Post-traumatic stress disorder, unspecified: Secondary | ICD-10-CM | POA: Insufficient documentation

## 2018-10-31 DIAGNOSIS — M79603 Pain in arm, unspecified: Secondary | ICD-10-CM | POA: Insufficient documentation

## 2018-10-31 DIAGNOSIS — M79606 Pain in leg, unspecified: Secondary | ICD-10-CM | POA: Diagnosis present

## 2018-10-31 DIAGNOSIS — E039 Hypothyroidism, unspecified: Secondary | ICD-10-CM | POA: Insufficient documentation

## 2018-10-31 DIAGNOSIS — F17210 Nicotine dependence, cigarettes, uncomplicated: Secondary | ICD-10-CM | POA: Diagnosis not present

## 2018-10-31 DIAGNOSIS — G894 Chronic pain syndrome: Secondary | ICD-10-CM | POA: Diagnosis not present

## 2018-10-31 DIAGNOSIS — Z79899 Other long term (current) drug therapy: Secondary | ICD-10-CM | POA: Diagnosis not present

## 2018-10-31 DIAGNOSIS — N183 Chronic kidney disease, stage 3 (moderate): Secondary | ICD-10-CM | POA: Insufficient documentation

## 2018-10-31 DIAGNOSIS — M79609 Pain in unspecified limb: Secondary | ICD-10-CM

## 2018-10-31 DIAGNOSIS — F259 Schizoaffective disorder, unspecified: Secondary | ICD-10-CM | POA: Insufficient documentation

## 2018-10-31 MED ORDER — PREDNISONE 10 MG PO TABS: 20.0000 mg | ORAL_TABLET | Freq: Two times a day (BID) | ORAL | 0 refills | Status: DC

## 2018-10-31 NOTE — ED Notes (Signed)
Staff went into pt's room to update vitals, pt stated multiple times "you give me oxycodone now." Pt informed that RN cannot just give medications without an order from a provider. Bed in locked and lowest position. Call bell within reach

## 2018-10-31 NOTE — Discharge Instructions (Addendum)
Continue taking oxycodone as previously prescribed.  Begin taking prednisone as prescribed today.  Follow-up with your primary doctor if symptoms or not improving in the next 2 to 3 days.

## 2018-10-31 NOTE — ED Notes (Addendum)
Pt verbalized discharge instructions and follow up care. Alert and assisted out via wheelchair. No iv. Taxi called for pt ([name])

## 2018-10-31 NOTE — ED Triage Notes (Signed)
Pt complains of pain in his arms and legs for all day, pt has a hx of gout

## 2018-10-31 NOTE — ED Provider Notes (Addendum)
[facility] DEPT Provider Note   CSN: [id number] Arrival date & time: 10/31/18  0028     History   Chief Complaint No chief complaint on file.   HPI [name] is a 58 y.o. male.     Patient is a 58 year old-year-old male with history of cirrhosis, hepatitis C, schizoaffective disorder, and chronic pain syndrome.  He presents today for evaluation of pain in his arms and legs.  He describes "10,000 out of 10" pain to his arms and legs, including his joints and muscles.  He denies any injury or trauma.  Patient has oxycodone at home which she has been taking with little relief.  The history is provided by the patient.    Past Medical History:  Diagnosis Date   Anemia    Ascites    Chronic leg pain    Cirrhosis (HCC)    GERD (gastroesophageal reflux disease)    H/O diabetes insipidus    Hepatitis C    Completed therapy with Epclusa ~ 2018.  suspect therapy overseen by [facility] in [place], [place]   History of adrenal insufficiency    History of ARDS    History of blood transfusion    Hypothyroidism    Kidney disease    stage 3   Neuropathy    Overdose 12/2009   Peripheral neuropathy    Personality disorder (HCC)    PTSD (post-traumatic stress disorder)    SECONDARY TO WAR IN [place]   Schizoaffective disorder [facility])     Patient Active Problem List   Diagnosis Date Noted   GI bleed 08/07/2018   Acute abdominal pain    Cirrhosis of liver with ascites (HCC)    Generalized abdominal pain    Hematemesis with nausea    Evaluation by psychiatric service required    Acute upper gastrointestinal bleeding 10/15/2017   PTSD (post-traumatic stress disorder)    Hypothyroidism    History of adrenal insufficiency    H/O diabetes insipidus    Schizoaffective disorder ([place]) 11/08/2011   Personality disorder ([place])    Anemia    Ascites    Liver disease    Hepatitis C    Kidney disease    GERD  (gastroesophageal reflux disease)    Psychosis ([place]) 11/07/2011    Past Surgical History:  Procedure Laterality Date   CERVICAL SPINE SURGERY  unknown   RIGHT HIP SURGERY  1996   US GUIDED LEFT THORACENTESIS  01/21/2010        Home Medications    Prior to Admission medications   Medication Sig Start Date End Date Taking? Authorizing Provider  cephALEXin (KEFLEX) 500 MG capsule Take 1 capsule (500 mg total) by mouth 4 (four) times daily. Patient not taking: Reported on 10/01/2018 08/24/18   Varney Biles, MD  colchicine 0.6 MG tablet Take 1 tablet (0.6 mg total) by mouth daily. 08/26/18   Eustaquio Maize, PA-C  [employer] D3 2000 units CAPS Take 2,000 Units by mouth daily. 10/23/17   [provider]  diclofenac sodium (VOLTAREN) 1 % GEL Apply 4 g topically 4 (four) times daily. 10/01/18   Palumbo, April, MD  diphenhydrAMINE (BENADRYL) 25 MG tablet Take 25 mg by mouth at bedtime.     [provider]  doxycycline (VIBRAMYCIN) 100 MG capsule Take 1 capsule (100 mg total) by mouth 2 (two) times daily. Patient not taking: Reported on 10/01/2018 08/24/18   Varney Biles, MD  fluPHENAZine (PROLIXIN) 5 MG tablet Take 5 mg by  mouth 3 (three) times daily.    [provider]  furosemide (LASIX) 20 MG tablet Take 20 mg by mouth daily.     [provider]  gabapentin (NEURONTIN) 100 MG capsule Take 200 mg by mouth 3 (three) times daily. 07/21/18   [provider]  LORazepam (ATIVAN) 0.5 MG tablet Take 0.5 mg by mouth every 8 (eight) hours as needed for anxiety.     [provider]  meloxicam (MOBIC) 15 MG tablet Take 15 mg by mouth daily. 07/06/18   [provider]  ondansetron (ZOFRAN ODT) 8 MG disintegrating tablet Take 1 tablet (8 mg total) by mouth every 8 (eight) hours as needed for nausea or vomiting. Patient not taking: Reported on 11/02/2017 11/14/15   Azalia Bilis, MD  oxyCODONE (OXY IR/ROXICODONE) 5 MG immediate release tablet Take 1  tablet (5 mg total) by mouth every 6 (six) hours as needed for severe pain. Patient not taking: Reported on 10/01/2018 09/08/17   Garlon Hatchet, PA-C  pantoprazole (PROTONIX) 40 MG tablet Take 1 tablet (40 mg total) by mouth 2 (two) times daily. Patient not taking: Reported on 10/01/2018 10/18/17   Simonne Martinet, NP  SENSIPAR 30 MG tablet Take 30 mg by mouth daily. 07/25/17   [provider]  trihexyphenidyl (ARTANE) 2 MG tablet Take 2 mg by mouth 3 (three) times daily.    [provider]    Family History Family History  Problem Relation Age of Onset   Cancer Mother    Heart attack Father     Social History Social History   Tobacco Use   Smoking status: Current Every Day Smoker    Packs/day: 1.00    Years: 25.00    Pack years: 25.00    Types: Cigarettes   Smokeless tobacco: Never Used  Substance Use Topics   Alcohol use: No    Comment: Pt denies    Drug use: No    Comment: Pt denies     Allergies   Patient has no known allergies.   Review of Systems Review of Systems  All other systems reviewed and are negative.    Physical Exam Updated Vital Signs BP 132/76 (BP Location: Right Arm)    Pulse 68    Temp 98.6 F (37 C) (Oral)    Resp 15    Ht 6\' 6"  (1.981 m)    Wt 102.1 kg    SpO2 99%    BMI 26.00 kg/m   Physical Exam Vitals signs and nursing note reviewed.  Constitutional:      General: He is not in acute distress.    Appearance: Normal appearance. He is not ill-appearing.  Cardiovascular:     Rate and Rhythm: Normal rate.  Pulmonary:     Effort: Pulmonary effort is normal.  Musculoskeletal:     Comments: All 4 extremities appear grossly normal.  He has full range of motion of his elbows and wrists and knees and ankles.  There is no swelling of the extremities or tenderness in the muscles.  Extremities are well perfused with easily palpable distal pulses and motor and sensation are intact throughout.  Skin:    General: Skin is warm  and dry.  Neurological:     Mental Status: He is alert and oriented to person, place, and time.      ED Treatments / Results  Labs (all labs ordered are listed, but only abnormal results are displayed) Labs Reviewed - No data to display  EKG None  Radiology No results found.  Procedures Procedures (including critical care time)  Medications Ordered in ED Medications - No data to display   Initial Impression / Assessment and Plan / ED Course  I have reviewed the triage vital signs and the nursing notes.  Pertinent labs & imaging results that were available during my care of the patient were reviewed by me and considered in my medical decision making (see chart for details).  Patient presents here with complaints of severe pain in his arms and legs.  Patient has history of chronic pain syndrome and is on chronic opioids.  He states the oxycodone he takes at home is not helping.  I am uncertain as to the etiology of this patient's pain, however it is clearly chronic as he has presented here multiple times in the past with similar complaints.  Patient on chronic opioids and advised he will not receive additional opioids here in the ER.  He was given 90 oxycodone on 20 July per the [facility].  I have advised the patient to continue taking this.  He will also be prescribed prednisone today in case this is some sort of inflammatory process.  When patient given his discharge instructions, he demanded the nurse give him oxycodone.  As listed above, he had received a prescription for 90 tablets of this less than two weeks ago.  He should have some left he can take.  Needs PCP to prescribe additional opioids as I not comfortable with doing this.  Final Clinical Impressions(s) / ED Diagnoses   Final diagnoses:  None    ED Discharge Orders    None       [name], MD 10/31/18 5038    [name], MD 10/31/18 [phone]

## 2018-11-05 ENCOUNTER — Emergency Department (HOSPITAL_COMMUNITY)
Admission: EM | Admit: 2018-11-05 | Discharge: 2018-11-05 | Disposition: A | Discharge status: Home / Self Care | Payer: Medicaid Other | Attending: Emergency Medicine | Admitting: Emergency Medicine

## 2018-11-05 ENCOUNTER — Encounter (HOSPITAL_COMMUNITY): Payer: Self-pay | Admitting: *Deleted

## 2018-11-05 ENCOUNTER — Other Ambulatory Visit: Payer: Self-pay

## 2018-11-05 ENCOUNTER — Emergency Department (HOSPITAL_COMMUNITY): Payer: Medicaid Other

## 2018-11-05 DIAGNOSIS — F17210 Nicotine dependence, cigarettes, uncomplicated: Secondary | ICD-10-CM | POA: Diagnosis not present

## 2018-11-05 DIAGNOSIS — F4310 Post-traumatic stress disorder, unspecified: Secondary | ICD-10-CM | POA: Insufficient documentation

## 2018-11-05 DIAGNOSIS — N183 Chronic kidney disease, stage 3 (moderate): Secondary | ICD-10-CM | POA: Insufficient documentation

## 2018-11-05 DIAGNOSIS — Z79899 Other long term (current) drug therapy: Secondary | ICD-10-CM | POA: Diagnosis not present

## 2018-11-05 DIAGNOSIS — R0789 Other chest pain: Secondary | ICD-10-CM | POA: Diagnosis present

## 2018-11-05 DIAGNOSIS — I471 Supraventricular tachycardia: Secondary | ICD-10-CM | POA: Diagnosis not present

## 2018-11-05 DIAGNOSIS — E039 Hypothyroidism, unspecified: Secondary | ICD-10-CM | POA: Insufficient documentation

## 2018-11-05 LAB — BASIC METABOLIC PANEL
Anion gap: 8 (ref 5–15)
BUN: 20 mg/dL (ref 6–20)
CO2: 22 mmol/L (ref 22–32)
Calcium: 8.3 mg/dL — ABNORMAL LOW (ref 8.9–10.3)
Chloride: 115 mmol/L — ABNORMAL HIGH (ref 98–111)
Creatinine, Ser: 2.07 mg/dL — ABNORMAL HIGH (ref 0.61–1.24)
GFR calc Af Amer: 40 mL/min — ABNORMAL LOW (ref >60)
GFR calc non Af Amer: 34 mL/min — ABNORMAL LOW (ref >60)
Glucose, Bld: 107 mg/dL — ABNORMAL HIGH (ref 70–99)
Potassium: 3.4 mmol/L — ABNORMAL LOW (ref 3.5–5.1)
Sodium: 145 mmol/L (ref 135–145)

## 2018-11-05 LAB — TROPONIN I (HIGH SENSITIVITY)
Troponin I (High Sensitivity): 9 ng/L (ref <18)
Troponin I (High Sensitivity): 11 ng/L (ref <18)

## 2018-11-05 LAB — CBC
HCT: 33.6 % — ABNORMAL LOW (ref 39.0–52.0)
Hemoglobin: 10.6 g/dL — ABNORMAL LOW (ref 13.0–17.0)
MCH: 30 pg (ref 26.0–34.0)
MCHC: 31.5 g/dL (ref 30.0–36.0)
MCV: 95.2 fL (ref 80.0–100.0)
Platelets: 142 10*3/uL — ABNORMAL LOW (ref 150–400)
RBC: 3.53 MIL/uL — ABNORMAL LOW (ref 4.22–5.81)
RDW: 20.8 % — ABNORMAL HIGH (ref 11.5–15.5)
WBC: 6.1 10*3/uL (ref 4.0–10.5)
nRBC: 0 % (ref 0.0–0.2)

## 2018-11-05 MED ORDER — SODIUM CHLORIDE 0.9% FLUSH: 3.0000 mL | Freq: Once | INTRAVENOUS | Status: DC

## 2018-11-05 NOTE — ED Triage Notes (Signed)
PT here from home via [facility].  Called 911 this am c/o chest pain.  HR was 180.  Given 6 mg adenosine, 500 ml NS.  HR improved to 82, bp 118/80, sats 97%.  Pt pain decreased to 8 with 1 sl nitro.  Pt refused asa.

## 2018-11-08 ENCOUNTER — Telehealth: Payer: Self-pay

## 2018-11-08 NOTE — Telephone Encounter (Signed)
REFERRAL FROM THE ED, SENT REFERRAL TO SCHEDULING 

## 2018-11-09 NOTE — ED Provider Notes (Signed)
[facility] EMERGENCY [facility]PARTMENT Provider Note   CSN: [id number] Arrival date & time: 11/05/18  1105     History   Chief Complaint Chief Complaint  Patient presents with  . Chest Pain    svt    HPI [name] is a 58 y.o. male.     HPI   58 year old male brought in by EMS.  He initially called this morning after experiencing chest discomfort.  Started sometime shortly after he got up.  EMS noted a heart rate of 180.  Felt to be SVT and he was given 6 mg of adenosine.  He socially cardioverted to sinus rhythm.  Symptoms resolved after this.  Currently has no complaints and is requesting to go home.  Past Medical History:  Diagnosis Date  . Anemia   . Ascites   . Chronic leg pain   . Cirrhosis (HCC)   . GERD (gastroesophageal reflux disease)   . H/O diabetes insipidus   . Hepatitis C    Completed therapy with Epclusa ~ 2018.  suspect therapy overseen [place] [facility] [place]  . History of adrenal insufficiency   . History of ARDS   . History of blood transfusion   . Hypothyroidism   . Kidney disease    stage 3  . Neuropathy   . Overdose 12/2009  . Peripheral neuropathy   . Personality disorder (HCC)   . PTSD (post-traumatic stress disorder)    SECONDARY [place]  . Schizoaffective disorder [facility](HCC)     Patient Active Problem List   Diagnosis Date Noted  . GI bleed 08/07/2018  . Acute abdominal pain   . Cirrhosis of liver with ascites (HCC)   . Generalized abdominal pain   . Hematemesis with nausea   . Evaluation by psychiatric service required   . Acute upper gastrointestinal bleeding 10/15/2017  . PTSD (post-traumatic stress disorder)   . Hypothyroidism   . History of adrenal insufficiency   . H/O diabetes insipidus   . Schizoaffective disorder (HCC) 11/08/2011  . Personality disorder (HCC)   . Anemia   . Ascites   . Liver disease   . Hepatitis C   . Kidney disease   . GERD (gastroesophageal reflux  disease)   . Psychosis (HCC) 11/07/2011    Past Surgical History:  Procedure Laterality Date  . CERVICAL SPINE SURGERY  unknown  . RIGHT HIP SURGERY  1996  . US GUIDED LEFT THORACENTESIS  01/21/2010        Home Medications    Prior to Admission medications   Medication Sig Start Date End Date Taking? Authorizing Provider  [employer] D3 2000 units CAPS Take 2,000 Units by mouth daily. 10/23/17  Yes [provider]  fluPHENAZine (PROLIXIN) 5 MG tablet Take 5 mg by mouth 3 (three) times daily.   Yes [provider]  gabapentin (NEURONTIN) 100 MG capsule Take 200 mg by mouth 3 (three) times daily. 07/21/18  Yes [provider]  LORazepam (ATIVAN) 0.5 MG tablet Take 0.5 mg by mouth at bedtime.    Yes [provider]  meloxicam (MOBIC) 15 MG tablet Take 15 mg by mouth daily. 07/06/18  Yes [provider]  Oxycodone HCl 10 MG TABS Take 10 mg by mouth every 8 (eight) hours. 10/18/18  Yes [provider]  predniSONE (DELTASONE) 10 MG tablet Take 2 tablets (20 mg total) by mouth 2 (two) times daily with a meal. 10/31/18  Yes Geoffery Lyonselo, [place], MD  SENSIPAR 30 MG tablet Take 30 mg by mouth daily. 07/25/17  Yes [provider]  trihexyphenidyl (ARTANE) 2 MG tablet Take 2 mg by mouth 3 (three) times daily.   Yes [provider]  cephALEXin (KEFLEX) 500 MG capsule Take 1 capsule (500 mg total) by mouth 4 (four) times daily. Patient not taking: Reported on 10/01/2018 08/24/18   Varney Biles, MD  colchicine 0.6 MG tablet Take 1 tablet (0.6 mg total) by mouth daily. Patient not taking: Reported on 11/05/2018 08/26/18   Eustaquio Maize, PA-C  diclofenac sodium (VOLTAREN) 1 % GEL Apply 4 g topically 4 (four) times daily. 10/01/18   Palumbo, April, MD  diphenhydrAMINE (BENADRYL) 25 MG tablet Take 25 mg by mouth at bedtime.     [provider]  doxycycline (VIBRAMYCIN) 100 MG capsule Take 1 capsule (100 mg total) by mouth 2 (two) times daily.  Patient not taking: Reported on 10/01/2018 08/24/18   Varney Biles, MD  ondansetron (ZOFRAN ODT) 8 MG disintegrating tablet Take 1 tablet (8 mg total) by mouth every 8 (eight) hours as needed for nausea or vomiting. Patient not taking: Reported on 11/02/2017 11/14/15   Jola Schmidt, MD  oxyCODONE (OXY IR/ROXICODONE) 5 MG immediate release tablet Take 1 tablet (5 mg total) by mouth every 6 (six) hours as needed for severe pain. Patient not taking: Reported on 10/01/2018 09/08/17   Larene Pickett, PA-C  pantoprazole (PROTONIX) 40 MG tablet Take 1 tablet (40 mg total) by mouth 2 (two) times daily. Patient not taking: Reported on 10/01/2018 10/18/17   Erick Colace, NP    Family History Family History  Problem Relation Age of Onset  . Cancer Mother   . Heart attack Father     Social History Social History   Tobacco Use  . Smoking status: Current Every Day Smoker    Packs/day: 1.00    Years: 25.00    Pack years: 25.00    Types: Cigarettes  . Smokeless tobacco: Never Used  Substance Use Topics  . Alcohol use: No    Comment: Pt denies   . Drug use: No    Comment: Pt denies     Allergies   Patient has no known allergies.   Review of Systems Review of Systems  All systems reviewed and negative, other than as noted in HPI.  Physical Exam Updated Vital Signs BP 110/71 (BP Location: Right Arm)   Pulse 70   Temp 97.8 F (36.6 C) (Oral)   Resp 18   Ht 6\' 6"  (1.981 m)   Wt 102.1 kg   SpO2 99%   BMI 26.01 kg/m   Physical Exam Vitals signs and nursing note reviewed.  Constitutional:      General: He is not in acute distress.    Appearance: He is well-developed.  HENT:     Head: Normocephalic and atraumatic.  Eyes:     General:        Right eye: No discharge.        Left eye: No discharge.     Conjunctiva/sclera: Conjunctivae normal.  Neck:     Musculoskeletal: Neck supple.  Cardiovascular:     Rate and Rhythm: Normal rate and regular rhythm.     Heart sounds:  Normal heart sounds. No murmur. No friction rub. No gallop.   Pulmonary:     Effort: Pulmonary effort is normal. No respiratory distress.     Breath sounds: Normal breath sounds.  Abdominal:     General: There  is no distension.     Palpations: Abdomen is soft.     Tenderness: There is no abdominal tenderness.  Musculoskeletal:        General: No tenderness.  Skin:    General: Skin is warm and dry.  Neurological:     Mental Status: He is alert.  Psychiatric:        Behavior: Behavior normal.        Thought Content: Thought content normal.      ED Treatments / Results  Labs (all labs ordered are listed, but only abnormal results are displayed) Labs Reviewed  BASIC METABOLIC PANEL - Abnormal; Notable for the following components:      Result Value   Potassium 3.4 (*)    Chloride 115 (*)    Glucose, Bld 107 (*)    Creatinine, Ser 2.07 (*)    Calcium 8.3 (*)    GFR calc non Af Amer 34 (*)    GFR calc Af Amer 40 (*)    All other components within normal limits  CBC - Abnormal; Notable for the following components:   RBC 3.53 (*)    Hemoglobin 10.6 (*)    HCT 33.6 (*)    RDW 20.8 (*)    Platelets 142 (*)    All other components within normal limits  TROPONIN I (HIGH SENSITIVITY)  TROPONIN I (HIGH SENSITIVITY)    EKG EKG Interpretation  Date/Time:  Sunday November 05 2018 11:10:37 EDT Ventricular Rate:  78 PR Interval:    QRS Duration: 102 QT Interval:  412 QTC Calculation: 470 R Axis:   63 Text Interpretation:  Sinus rhythm Abnormal R-wave progression, early transition Confirmed by Adilen Pavelko ([id number]) on 11/05/2018 11:40:22 AM Also confirmed by Luvena Wentling ([id number]), editor [place], Beverly ([id number])  on 11/06/2018 7:32:59 AM   Radiology No results found.  Procedures Procedures (including critical care time)  Medications Ordered in ED Medications - No data to display   Initial Impression / Assessment and Plan / ED Course  I have reviewed the triage vital  signs and the nursing notes.  Pertinent labs & imaging results that were available during my care of the patient were reviewed by me and considered in my medical decision making (see chart for details).       58  year old male with SVT, now converted.  No further complaints.  Basic labs pretty unremarkable.  Turn precautions were discussed.  Outpatient cardiology follow-up otherwise. Final Clinical Impressions(s) / ED Diagnoses   Final diagnoses:  SVT (supraventricular tachycardia) [facility])    ED Discharge Orders    None       [facility], MD 11/16/18 [phone]

## 2018-11-26 ENCOUNTER — Other Ambulatory Visit: Payer: Self-pay

## 2018-11-26 ENCOUNTER — Emergency Department (HOSPITAL_COMMUNITY): Payer: Medicaid Other

## 2018-11-26 ENCOUNTER — Emergency Department (HOSPITAL_COMMUNITY)
Admission: EM | Admit: 2018-11-26 | Discharge: 2018-11-26 | Disposition: A | Discharge status: Home / Self Care | Payer: Medicaid Other | Attending: Emergency Medicine | Admitting: Emergency Medicine

## 2018-11-26 ENCOUNTER — Encounter (HOSPITAL_COMMUNITY): Payer: Self-pay | Admitting: Emergency Medicine

## 2018-11-26 DIAGNOSIS — M79601 Pain in right arm: Secondary | ICD-10-CM | POA: Diagnosis not present

## 2018-11-26 DIAGNOSIS — R202 Paresthesia of skin: Secondary | ICD-10-CM | POA: Diagnosis not present

## 2018-11-26 DIAGNOSIS — R600 Localized edema: Secondary | ICD-10-CM | POA: Insufficient documentation

## 2018-11-26 DIAGNOSIS — Z79899 Other long term (current) drug therapy: Secondary | ICD-10-CM | POA: Insufficient documentation

## 2018-11-26 DIAGNOSIS — F17210 Nicotine dependence, cigarettes, uncomplicated: Secondary | ICD-10-CM | POA: Diagnosis not present

## 2018-11-26 DIAGNOSIS — E039 Hypothyroidism, unspecified: Secondary | ICD-10-CM | POA: Diagnosis not present

## 2018-11-26 DIAGNOSIS — M25521 Pain in right elbow: Secondary | ICD-10-CM

## 2018-11-26 DIAGNOSIS — N183 Chronic kidney disease, stage 3 (moderate): Secondary | ICD-10-CM | POA: Insufficient documentation

## 2018-11-26 MED ORDER — TRAMADOL HCL 50 MG PO TABS
50.0000 mg | ORAL_TABLET | Freq: Once | ORAL | Status: AC
  Administered 2018-11-26: 15:00:00 50 mg via ORAL
  Filled 2018-11-26: qty 1

## 2018-11-26 MED ORDER — PREDNISONE 50 MG PO TABS: 50.0000 mg | ORAL_TABLET | Freq: Every day | ORAL | 0 refills | Status: DC

## 2018-11-26 NOTE — ED Triage Notes (Addendum)
Per pt, states right arm pain for 3 days-no injury, decreased ROM-no deformity noted

## 2018-11-26 NOTE — Discharge Instructions (Addendum)
Follow-up with your primary care doctor.  Return here for any worsening in your condition.  Ice and elevate the arm.

## 2018-11-26 NOTE — ED Notes (Signed)
Family at bedside. 

## 2018-11-26 NOTE — ED Provider Notes (Signed)
[facility]-EMERGENCY DEPT Provider Note   CSN: [id number] Arrival date & time: 11/26/18  1045     History   Chief Complaint Chief Complaint  Patient presents with  . Arm Pain    HPI [name] is a 58 y.o. male.     HPI  The patient presents with a cc of right arm pain for the past 2 days. He states the pain begins at his mid-right bicep area and radiates down his entire right arm. Patient describes the pain as sharp and stabbing, like pins and needles. He states that his pain hurts most over the dorsal and lateral aspect of his right elbow. He denies any hand pain, numbness or tingling. He states that he has pain with slight movement of his arm and his range of motion is somewhat limited. Patient endorses a PMH of arthritis and gout, and states that this pain is worse than any pain he has experienced with these conditions in the past. He reports he took oxycodone yesterday, which helped alleviate his pain to an extent but not completely. He has not taken any pain medication today. Patient has not tried heat or ice for his pain. Patient denies any chest pain, SOB, fever, chills, nausea/vomiting, headaches, dizziness, lightheadedness, or syncope.    Past Medical History:  Diagnosis Date  . Anemia   . Ascites   . Chronic leg pain   . Cirrhosis (HCC)   . GERD (gastroesophageal reflux disease)   . H/O diabetes insipidus   . Hepatitis C    Completed therapy with Epclusa ~ 2018.  suspect therapy overseen by [facility] in [place], [place]  . History of adrenal insufficiency   . History of ARDS   . History of blood transfusion   . Hypothyroidism   . Kidney disease    stage 3  . Neuropathy   . Overdose 12/2009  . Peripheral neuropathy   . Personality disorder (HCC)   . PTSD (post-traumatic stress disorder)    SECONDARY TO WAR IN [place]  . Schizoaffective disorder [facility])     Patient Active Problem List   Diagnosis Date Noted  . GI bleed  08/07/2018  . Acute abdominal pain   . Cirrhosis of liver with ascites (HCC)   . Generalized abdominal pain   . Hematemesis with nausea   . Evaluation by psychiatric service required   . Acute upper gastrointestinal bleeding 10/15/2017  . PTSD (post-traumatic stress disorder)   . Hypothyroidism   . History of adrenal insufficiency   . H/O diabetes insipidus   . Schizoaffective disorder (HCC) 11/08/2011  . Personality disorder (HCC)   . Anemia   . Ascites   . Liver disease   . Hepatitis C   . Kidney disease   . GERD (gastroesophageal reflux disease)   . Psychosis (HCC) 11/07/2011    Past Surgical History:  Procedure Laterality Date  . CERVICAL SPINE SURGERY  unknown  . RIGHT HIP SURGERY  1996  . US GUIDED LEFT THORACENTESIS  01/21/2010        Home Medications    Prior to Admission medications   Medication Sig Start Date End Date Taking? Authorizing Provider  [employer] D3 2000 units CAPS Take 2,000 Units by mouth daily. 10/23/17  Yes [provider]  diphenhydrAMINE (BENADRYL) 25 MG tablet Take 25 mg by mouth at bedtime.    Yes [provider]  fluPHENAZine (PROLIXIN) 5 MG tablet Take 5 mg by mouth 3 (three) times daily.  Yes [provider]  gabapentin (NEURONTIN) 100 MG capsule Take 200 mg by mouth 3 (three) times daily. 07/21/18  Yes [provider]  LORazepam (ATIVAN) 0.5 MG tablet Take 0.5 mg by mouth at bedtime.    Yes [provider]  meloxicam (MOBIC) 15 MG tablet Take 15 mg by mouth daily. 07/06/18  Yes [provider]  Oxycodone HCl 10 MG TABS Take 10 mg by mouth every 8 (eight) hours. 10/18/18  Yes [provider]  predniSONE (DELTASONE) 10 MG tablet Take 2 tablets (20 mg total) by mouth 2 (two) times daily with a meal. 10/31/18  Yes Delo, Douglas, MD  SENSIPAR 30 MG tablet Take 30 mg by mouth daily. 07/25/17  Yes [provider]  trihexyphenidyl (ARTANE) 2 MG tablet Take 2 mg by mouth 3 (three) times  daily.   Yes [provider]  cephALEXin (KEFLEX) 500 MG capsule Take 1 capsule (500 mg total) by mouth 4 (four) times daily. Patient not taking: Reported on 10/01/2018 08/24/18   Varney Biles, MD  colchicine 0.6 MG tablet Take 1 tablet (0.6 mg total) by mouth daily. Patient not taking: Reported on 11/05/2018 08/26/18   Eustaquio Maize, PA-C  diclofenac sodium (VOLTAREN) 1 % GEL Apply 4 g topically 4 (four) times daily. Patient not taking: Reported on 11/26/2018 10/01/18   Palumbo, April, MD  doxycycline (VIBRAMYCIN) 100 MG capsule Take 1 capsule (100 mg total) by mouth 2 (two) times daily. Patient not taking: Reported on 10/01/2018 08/24/18   Varney Biles, MD  ondansetron (ZOFRAN ODT) 8 MG disintegrating tablet Take 1 tablet (8 mg total) by mouth every 8 (eight) hours as needed for nausea or vomiting. Patient not taking: Reported on 11/02/2017 11/14/15   Jola Schmidt, MD  oxyCODONE (OXY IR/ROXICODONE) 5 MG immediate release tablet Take 1 tablet (5 mg total) by mouth every 6 (six) hours as needed for severe pain. Patient not taking: Reported on 10/01/2018 09/08/17   Larene Pickett, PA-C  pantoprazole (PROTONIX) 40 MG tablet Take 1 tablet (40 mg total) by mouth 2 (two) times daily. Patient not taking: Reported on 10/01/2018 10/18/17   Erick Colace, NP    Family History Family History  Problem Relation Age of Onset  . Cancer Mother   . Heart attack Father     Social History Social History   Tobacco Use  . Smoking status: Current Every Day Smoker    Packs/day: 1.00    Years: 25.00    Pack years: 25.00    Types: Cigarettes  . Smokeless tobacco: Never Used  Substance Use Topics  . Alcohol use: No    Comment: Pt denies   . Drug use: No    Comment: Pt denies     Allergies   Patient has no known allergies.   Review of Systems Review of Systems  All other systems negative except as documented in the HPI. All pertinent positives and negatives as reviewed in the HPI. Physical  Exam Updated Vital Signs BP (!) 153/78 (BP Location: Left Arm)   Pulse 71   Temp 99.1 F (37.3 C) (Oral)   Resp 18   SpO2 96%   Physical Exam Vitals signs and nursing note reviewed.  Constitutional:      General: He is not in acute distress.    Appearance: He is well-developed.  HENT:     Head: Normocephalic and atraumatic.  Eyes:     Pupils: Pupils are equal, round, and reactive to light.  Pulmonary:  Effort: Pulmonary effort is normal.  Musculoskeletal:     Right elbow: He exhibits swelling. He exhibits no effusion. Tenderness found. No radial head, no medial epicondyle and no lateral epicondyle tenderness noted.       Arms:  Skin:    General: Skin is warm and dry.  Neurological:     Mental Status: He is alert and oriented to person, place, and time.  Psychiatric:        Mood and Affect: Mood normal.        Behavior: Behavior normal.      ED Treatments / Results  Labs (all labs ordered are listed, but only abnormal results are displayed) Labs Reviewed - No data to display  EKG None  Radiology Dg Elbow Complete Right  Result Date: 11/26/2018 CLINICAL DATA:  Right elbow pain and swelling for the past 2 days. No trauma. EXAM: RIGHT ELBOW - COMPLETE 3+ VIEW COMPARISON:  None. FINDINGS: No acute fracture or dislocation. No joint effusion. Chronic appearing irregularity of the peripheral capitellum may be degenerative or due to old trauma. Mild degenerative changes with marginal spurring. Osteopenia. Diffuse soft tissue swelling about the elbow. IMPRESSION: 1. Diffuse soft tissue swelling.  No acute osseous abnormality. Electronically Signed   By: Obie Dredge M.D.   On: 11/26/2018 13:55    Procedures Procedures (including critical care time)  Medications Ordered in ED Medications  traMADol (ULTRAM) tablet 50 mg (50 mg Oral Given 11/26/18 1445)     Initial Impression / Assessment and Plan / ED Course  I have reviewed the triage vital signs and the nursing  notes.  Pertinent labs & imaging results that were available during my care of the patient were reviewed by me and considered in my medical decision making (see chart for details).    At this point I do not feel that the patient has a septic joint based on his physical exam findings.  He said no fevers.  I have advised the patient to follow-up with his primary doctor.  I do feel that this could be a gouty situation because of the tenderness that is pretty exquisite.     Final Clinical Impressions(s) / ED Diagnoses   Final diagnoses:  None    ED Discharge Orders    None       Charlestine Night, PA-C 11/26/18 1513    Lorre Nick, MD 11/27/18 [phone]

## 2018-12-09 ENCOUNTER — Other Ambulatory Visit: Payer: Self-pay

## 2018-12-09 ENCOUNTER — Encounter (HOSPITAL_COMMUNITY): Payer: Self-pay | Admitting: *Deleted

## 2018-12-09 ENCOUNTER — Emergency Department (HOSPITAL_COMMUNITY)
Admission: EM | Admit: 2018-12-09 | Discharge: 2018-12-09 | Disposition: A | Discharge status: Home / Self Care | Payer: Medicaid Other | Attending: Emergency Medicine | Admitting: Emergency Medicine

## 2018-12-09 DIAGNOSIS — M79603 Pain in arm, unspecified: Secondary | ICD-10-CM | POA: Diagnosis present

## 2018-12-09 DIAGNOSIS — Z79899 Other long term (current) drug therapy: Secondary | ICD-10-CM | POA: Diagnosis not present

## 2018-12-09 DIAGNOSIS — F17210 Nicotine dependence, cigarettes, uncomplicated: Secondary | ICD-10-CM | POA: Insufficient documentation

## 2018-12-09 DIAGNOSIS — E1122 Type 2 diabetes mellitus with diabetic chronic kidney disease: Secondary | ICD-10-CM | POA: Insufficient documentation

## 2018-12-09 DIAGNOSIS — G894 Chronic pain syndrome: Secondary | ICD-10-CM | POA: Diagnosis not present

## 2018-12-09 DIAGNOSIS — N183 Chronic kidney disease, stage 3 (moderate): Secondary | ICD-10-CM | POA: Diagnosis not present

## 2018-12-09 MED ORDER — OXYCODONE-ACETAMINOPHEN 5-325 MG PO TABS
1.0000 | ORAL_TABLET | Freq: Once | ORAL | Status: AC
  Administered 2018-12-09: 1 via ORAL
  Filled 2018-12-09: qty 1

## 2018-12-09 NOTE — ED Triage Notes (Signed)
Pt says that he is suppose to take oxycodone for pain, has been out for two days. Pain in arms and legs.

## 2018-12-09 NOTE — Discharge Instructions (Signed)
You are out of your pain medication because you are using this medication too frequently.  You should have an ongoing discussion with your pain management provider about any possible changes to your current pain regimen.  You may use over-the-counter medications as needed for pain control until you are able to follow-up with your pain management provider.

## 2018-12-09 NOTE — ED Triage Notes (Signed)
Pt is from home via EMS.Pt is out of his pain medication, having pain in both legs and both arms. Hx of arthritis, goes to pain clinic. VSS

## 2018-12-09 NOTE — ED Provider Notes (Signed)
[facility]-EMERGENCY DEPT Provider Note   CSN: [id number] Arrival date & time: 12/09/18  0158     History   Chief Complaint Chief Complaint  Patient presents with  . Leg Pain    HPI [name] is a 58 y.o. male.     58 year old male with a history of chronic pain syndrome followed by pain management presents to the emergency department for pain in his arms and legs.  He states that his pain is severe.  It is constant.  This is a chronic problem for the patient.  He typically takes 10 mg oxycodone 3 times daily, but ran out of this medication 2 days ago.  This prescription was last filled on 11/17/2018.  Has not taken any over-the-counter medications for symptom control.  The history is provided by the patient. No language interpreter was used.  Leg Pain   Past Medical History:  Diagnosis Date  . Anemia   . Ascites   . Chronic leg pain   . Cirrhosis (HCC)   . GERD (gastroesophageal reflux disease)   . H/O diabetes insipidus   . Hepatitis C    Completed therapy with Epclusa ~ 2018.  suspect therapy overseen by [facility] clinic in [place], [place]  . History of adrenal insufficiency   . History of ARDS   . History of blood transfusion   . Hypothyroidism   . Kidney disease    stage 3  . Neuropathy   . Overdose 12/2009  . Peripheral neuropathy   . Personality disorder (HCC)   . PTSD (post-traumatic stress disorder)    SECONDARY TO WAR IN [place]  . Schizoaffective disorder [facility])     Patient Active Problem List   Diagnosis Date Noted  . GI bleed 08/07/2018  . Acute abdominal pain   . Cirrhosis of liver with ascites (HCC)   . Generalized abdominal pain   . Hematemesis with nausea   . Evaluation by psychiatric service required   . Acute upper gastrointestinal bleeding 10/15/2017  . PTSD (post-traumatic stress disorder)   . Hypothyroidism   . History of adrenal insufficiency   . H/O diabetes insipidus   . Schizoaffective disorder (HCC)  11/08/2011  . Personality disorder (HCC)   . Anemia   . Ascites   . Liver disease   . Hepatitis C   . Kidney disease   . GERD (gastroesophageal reflux disease)   . Psychosis (HCC) 11/07/2011    Past Surgical History:  Procedure Laterality Date  . CERVICAL SPINE SURGERY  unknown  . RIGHT HIP SURGERY  1996  . US GUIDED LEFT THORACENTESIS  01/21/2010        Home Medications    Prior to Admission medications   Medication Sig Start Date End Date Taking? Authorizing Provider  cephALEXin (KEFLEX) 500 MG capsule Take 1 capsule (500 mg total) by mouth 4 (four) times daily. Patient not taking: Reported on 10/01/2018 08/24/18   Derwood Kaplan, MD  colchicine 0.6 MG tablet Take 1 tablet (0.6 mg total) by mouth daily. Patient not taking: Reported on 11/05/2018 08/26/18   Tanda Rockers, PA-C  [employer] D3 2000 units CAPS Take 2,000 Units by mouth daily. 10/23/17   [provider]  diclofenac sodium (VOLTAREN) 1 % GEL Apply 4 g topically 4 (four) times daily. Patient not taking: Reported on 11/26/2018 10/01/18   Palumbo, April, MD  diphenhydrAMINE (BENADRYL) 25 MG tablet Take 25 mg by mouth at bedtime.     [provider]  doxycycline (VIBRAMYCIN) 100 MG capsule Take 1 capsule (100 mg total) by mouth 2 (two) times daily. Patient not taking: Reported on 10/01/2018 08/24/18   Derwood Kaplan, MD  fluPHENAZine (PROLIXIN) 5 MG tablet Take 5 mg by mouth 3 (three) times daily.    [provider]  gabapentin (NEURONTIN) 100 MG capsule Take 200 mg by mouth 3 (three) times daily. 07/21/18   [provider]  LORazepam (ATIVAN) 0.5 MG tablet Take 0.5 mg by mouth at bedtime.     [provider]  meloxicam (MOBIC) 15 MG tablet Take 15 mg by mouth daily. 07/06/18   [provider]  ondansetron (ZOFRAN ODT) 8 MG disintegrating tablet Take 1 tablet (8 mg total) by mouth every 8 (eight) hours as needed for nausea or vomiting. Patient not taking: Reported on 11/02/2017 11/14/15    Azalia Bilis, MD  oxyCODONE (OXY IR/ROXICODONE) 5 MG immediate release tablet Take 1 tablet (5 mg total) by mouth every 6 (six) hours as needed for severe pain. Patient not taking: Reported on 10/01/2018 09/08/17   Garlon Hatchet, PA-C  Oxycodone HCl 10 MG TABS Take 10 mg by mouth every 8 (eight) hours. 10/18/18   [provider]  pantoprazole (PROTONIX) 40 MG tablet Take 1 tablet (40 mg total) by mouth 2 (two) times daily. Patient not taking: Reported on 10/01/2018 10/18/17   Simonne Martinet, NP  predniSONE (DELTASONE) 50 MG tablet Take 1 tablet (50 mg total) by mouth daily. 11/26/18   Lawyer, Christopher, PA-C  SENSIPAR 30 MG tablet Take 30 mg by mouth daily. 07/25/17   [provider]  trihexyphenidyl (ARTANE) 2 MG tablet Take 2 mg by mouth 3 (three) times daily.    [provider]    Family History Family History  Problem Relation Age of Onset  . Cancer Mother   . Heart attack Father     Social History Social History   Tobacco Use  . Smoking status: Current Every Day Smoker    Packs/day: 1.00    Years: 25.00    Pack years: 25.00    Types: Cigarettes  . Smokeless tobacco: Never Used  Substance Use Topics  . Alcohol use: No    Comment: Pt denies   . Drug use: No    Comment: Pt denies     Allergies   Patient has no known allergies.   Review of Systems Review of Systems Ten systems reviewed and are negative for acute change, except as noted in the HPI.    Physical Exam Updated Vital Signs BP 112/65   Pulse 64   Temp 98.7 F (37.1 C) (Oral)   Resp 16   SpO2 98%   Physical Exam Vitals signs and nursing note reviewed.  Constitutional:      General: He is not in acute distress.    Appearance: He is well-developed. He is not diaphoretic.     Comments: Nontoxic appearing and in NAD  HENT:     Head: Normocephalic and atraumatic.  Eyes:     General: No scleral icterus.    Conjunctiva/sclera: Conjunctivae normal.  Neck:      Musculoskeletal: Normal range of motion.  Pulmonary:     Effort: Pulmonary effort is normal. No respiratory distress.     Comments: Respirations even and unlabored Musculoskeletal: Normal range of motion.  Skin:    General: Skin is warm and dry.     Coloration: Skin is not pale.     Findings: No erythema or rash.  Neurological:     Mental Status: He is alert and oriented to person, place, and time.     Coordination: Coordination normal.     Comments: GCS 15. Moving extremities spontaneously.  Psychiatric:        Behavior: Behavior normal.      ED Treatments / Results  Labs (all labs ordered are listed, but only abnormal results are displayed) Labs Reviewed - No data to display  EKG None  Radiology No results found.  Procedures Procedures (including critical care time)  Medications Ordered in ED Medications  oxyCODONE-acetaminophen (PERCOCET/ROXICET) 5-325 MG per tablet 1 tablet (has no administration in time range)     Initial Impression / Assessment and Plan / ED Course  I have reviewed the triage vital signs and the nursing notes.  Pertinent labs & imaging results that were available during my care of the patient were reviewed by me and considered in my medical decision making (see chart for details).        58 year old male presents for exacerbation of known chronic pain in extremities.  He has a history of arthritis and is followed by a pain clinic.  Ran out of his oxycodone prescription 1 week early.  Has been out of his medication for 2 days and is requesting pain medication in the ED.  He has been given 1 tablet of Percocet, but I have informed the patient that he needs to follow-up with his pain management provider and that his presentation tonight is an inappropriate use of the ED.  No present concern for emergent process.  He is stable for discharge; return precautions given.   Final Clinical Impressions(s) / ED Diagnoses   Final diagnoses:  Chronic  pain syndrome    ED Discharge Orders    None       Antonietta Breach, PA-C 12/09/18 0529    Fatima Blank, MD 12/10/18 0010

## 2018-12-11 ENCOUNTER — Emergency Department (HOSPITAL_COMMUNITY)
Admission: EM | Admit: 2018-12-11 | Discharge: 2018-12-12 | Discharge status: Against Medical Advice | Payer: Medicaid Other | Attending: Emergency Medicine | Admitting: Emergency Medicine

## 2018-12-11 ENCOUNTER — Encounter (HOSPITAL_COMMUNITY): Payer: Self-pay

## 2018-12-11 ENCOUNTER — Other Ambulatory Visit: Payer: Self-pay

## 2018-12-11 DIAGNOSIS — M7918 Myalgia, other site: Secondary | ICD-10-CM | POA: Diagnosis present

## 2018-12-11 DIAGNOSIS — Z5321 Procedure and treatment not carried out due to patient leaving prior to being seen by health care provider: Secondary | ICD-10-CM | POA: Insufficient documentation

## 2018-12-11 NOTE — ED Triage Notes (Signed)
Pt reports generalized body pain. He states that it is the same as it was last night when he was seen here. Ambulatory with cane. He states that last night, they told him that he had pain, but tonight he wants to know why.

## 2018-12-11 NOTE — ED Notes (Signed)
Pt is requesting something for heartburn. Pt says he was given oxycodone yesterday for this and has been taking it for the past year and a half.

## 2018-12-11 NOTE — ED Notes (Addendum)
Pt states that he is calling a taxi d/t wait time. Pt was made aware that we are working as fast as we can and that based on our triage protocol, he is safe to wait in the lobby until a room is available. Pt unhappy

## 2018-12-11 NOTE — ED Notes (Signed)
Pt requesting oxycodone

## 2018-12-14 ENCOUNTER — Emergency Department (HOSPITAL_COMMUNITY)
Admission: EM | Admit: 2018-12-14 | Discharge: 2018-12-14 | Disposition: A | Discharge status: Home / Self Care | Payer: Medicaid Other | Attending: Emergency Medicine | Admitting: Emergency Medicine

## 2018-12-14 ENCOUNTER — Encounter (HOSPITAL_COMMUNITY): Payer: Self-pay | Admitting: Emergency Medicine

## 2018-12-14 DIAGNOSIS — Z5321 Procedure and treatment not carried out due to patient leaving prior to being seen by health care provider: Secondary | ICD-10-CM | POA: Diagnosis not present

## 2018-12-14 DIAGNOSIS — R112 Nausea with vomiting, unspecified: Secondary | ICD-10-CM | POA: Diagnosis present

## 2018-12-14 LAB — CBC
HCT: 31.5 % — ABNORMAL LOW (ref 39.0–52.0)
Hemoglobin: 9.6 g/dL — ABNORMAL LOW (ref 13.0–17.0)
MCH: 29.8 pg (ref 26.0–34.0)
MCHC: 30.5 g/dL (ref 30.0–36.0)
MCV: 97.8 fL (ref 80.0–100.0)
Platelets: 134 10*3/uL — ABNORMAL LOW (ref 150–400)
RBC: 3.22 MIL/uL — ABNORMAL LOW (ref 4.22–5.81)
RDW: 17.2 % — ABNORMAL HIGH (ref 11.5–15.5)
WBC: 7.3 10*3/uL (ref 4.0–10.5)
nRBC: 0 % (ref 0.0–0.2)

## 2018-12-14 LAB — COMPREHENSIVE METABOLIC PANEL
ALT: 10 U/L (ref 0–44)
AST: 16 U/L (ref 15–41)
Albumin: 3.1 g/dL — ABNORMAL LOW (ref 3.5–5.0)
Alkaline Phosphatase: 68 U/L (ref 38–126)
Anion gap: 11 (ref 5–15)
BUN: 13 mg/dL (ref 6–20)
CO2: 23 mmol/L (ref 22–32)
Calcium: 8.9 mg/dL (ref 8.9–10.3)
Chloride: 107 mmol/L (ref 98–111)
Creatinine, Ser: 1.94 mg/dL — ABNORMAL HIGH (ref 0.61–1.24)
GFR calc Af Amer: 43 mL/min — ABNORMAL LOW (ref >60)
GFR calc non Af Amer: 37 mL/min — ABNORMAL LOW (ref >60)
Glucose, Bld: 91 mg/dL (ref 70–99)
Potassium: 3.7 mmol/L (ref 3.5–5.1)
Sodium: 141 mmol/L (ref 135–145)
Total Bilirubin: 1.2 mg/dL (ref 0.3–1.2)
Total Protein: 5.4 g/dL — ABNORMAL LOW (ref 6.5–8.1)

## 2018-12-14 LAB — LIPASE, BLOOD: Lipase: 26 U/L (ref 11–51)

## 2018-12-14 MED ORDER — SODIUM CHLORIDE 0.9% FLUSH: 3.0000 mL | Freq: Once | INTRAVENOUS | Status: DC

## 2018-12-14 NOTE — ED Triage Notes (Signed)
Patient here from home with complaints of nausea, vomiting x2 days. Reports that he is out of normal pain meds.

## 2018-12-29 ENCOUNTER — Emergency Department (HOSPITAL_COMMUNITY): Payer: Medicaid Other

## 2018-12-29 ENCOUNTER — Encounter (HOSPITAL_COMMUNITY): Payer: Self-pay

## 2018-12-29 ENCOUNTER — Other Ambulatory Visit: Payer: Self-pay

## 2018-12-29 DIAGNOSIS — Z5321 Procedure and treatment not carried out due to patient leaving prior to being seen by health care provider: Secondary | ICD-10-CM | POA: Insufficient documentation

## 2018-12-29 DIAGNOSIS — M25561 Pain in right knee: Secondary | ICD-10-CM | POA: Insufficient documentation

## 2018-12-29 DIAGNOSIS — M25521 Pain in right elbow: Secondary | ICD-10-CM | POA: Diagnosis not present

## 2018-12-29 NOTE — ED Triage Notes (Addendum)
Pt BIB [facility] from home. States that he has R knee and elbow pain after a mechanical fall. Denies LOC or head injury. A&Ox4. Ambulatory (with pain).

## 2018-12-30 ENCOUNTER — Emergency Department (HOSPITAL_COMMUNITY)
Admission: EM | Admit: 2018-12-30 | Discharge: 2018-12-30 | Discharge status: Against Medical Advice | Payer: Medicaid Other | Attending: Emergency Medicine | Admitting: Emergency Medicine

## 2018-12-30 DIAGNOSIS — R52 Pain, unspecified: Secondary | ICD-10-CM

## 2018-12-31 ENCOUNTER — Encounter (HOSPITAL_COMMUNITY): Payer: Self-pay | Admitting: Obstetrics and Gynecology

## 2018-12-31 ENCOUNTER — Emergency Department (HOSPITAL_COMMUNITY)
Admission: EM | Admit: 2018-12-31 | Discharge: 2018-12-31 | Disposition: A | Discharge status: Home / Self Care | Payer: Medicaid Other | Attending: Emergency Medicine | Admitting: Emergency Medicine

## 2018-12-31 ENCOUNTER — Emergency Department (HOSPITAL_COMMUNITY)
Admission: EM | Admit: 2018-12-31 | Discharge: 2018-12-31 | Disposition: A | Discharge status: Home / Self Care | Payer: Medicaid Other | Source: Home / Self Care | Attending: Emergency Medicine | Admitting: Emergency Medicine

## 2018-12-31 ENCOUNTER — Other Ambulatory Visit: Payer: Self-pay

## 2018-12-31 DIAGNOSIS — N183 Chronic kidney disease, stage 3 unspecified: Secondary | ICD-10-CM | POA: Insufficient documentation

## 2018-12-31 DIAGNOSIS — E039 Hypothyroidism, unspecified: Secondary | ICD-10-CM | POA: Insufficient documentation

## 2018-12-31 DIAGNOSIS — Z79899 Other long term (current) drug therapy: Secondary | ICD-10-CM | POA: Diagnosis not present

## 2018-12-31 DIAGNOSIS — R52 Pain, unspecified: Secondary | ICD-10-CM | POA: Diagnosis not present

## 2018-12-31 DIAGNOSIS — G894 Chronic pain syndrome: Secondary | ICD-10-CM | POA: Insufficient documentation

## 2018-12-31 DIAGNOSIS — F17210 Nicotine dependence, cigarettes, uncomplicated: Secondary | ICD-10-CM | POA: Insufficient documentation

## 2018-12-31 DIAGNOSIS — E1122 Type 2 diabetes mellitus with diabetic chronic kidney disease: Secondary | ICD-10-CM | POA: Diagnosis not present

## 2018-12-31 MED ORDER — KETOROLAC TROMETHAMINE 60 MG/2ML IM SOLN
30.0000 mg | Freq: Once | INTRAMUSCULAR | Status: AC
  Administered 2018-12-31: 30 mg via INTRAMUSCULAR
  Filled 2018-12-31: qty 2

## 2018-12-31 MED ORDER — OXYCODONE-ACETAMINOPHEN 5-325 MG PO TABS
1.0000 | ORAL_TABLET | Freq: Once | ORAL | Status: AC
  Administered 2018-12-31: 23:00:00 1 via ORAL
  Filled 2018-12-31: qty 1

## 2018-12-31 NOTE — ED Triage Notes (Signed)
Pt to ED from home c/o arthritis pain that has gotten worse over the past few days, hx arthritis past 2 years. Generalized pain in hands and feet. No other complaints. No medications given by EMS.

## 2018-12-31 NOTE — Discharge Instructions (Signed)
Please follow-up with your doctor regarding her chronic symptoms.  Return with any acute changes or worsening.

## 2018-12-31 NOTE — ED Notes (Signed)
[employer] called for pt °

## 2018-12-31 NOTE — ED Notes (Signed)
Pt given blanket, tomato soup and crackers per request. Called [employer] and made aware patient requesting their services for a ride home. There will be a delay in cab arrival.

## 2018-12-31 NOTE — ED Triage Notes (Signed)
Patient here from home, recently discharged seen here today for pain. Reports being out of pain meds for 3 days. Given "shot" here today states that "it didn't help".

## 2018-12-31 NOTE — ED Provider Notes (Signed)
[facility]-EMERGENCY DEPT Provider Note   CSN: [id number] Arrival date & time: 12/31/18  1826     History   Chief Complaint Chief Complaint  Patient presents with  . Hand Pain    Bilat arthritis pain  . Foot Pain    bilat arthritis pain     HPI [name] is a 58 y.o. male.     Patient with history of schizoaffective disorder, chronic kidney disease, chronic liver disease and cirrhosis, on oxycodone 10 mg 3 times a day, last prescribed 12/19/2018 --presents to the emergency department with complaint of full body pain.  Patient states that he has arthritis "in my entire skeleton".  He denies any areas of swelling.  No chest pain or shortness of breath.  No fevers.  States that he ran out of his pain medication.  Is requesting treatment here.  Transported by EMS.  Pain worse with movement.     Past Medical History:  Diagnosis Date  . Anemia   . Ascites   . Chronic leg pain   . Cirrhosis (HCC)   . GERD (gastroesophageal reflux disease)   . H/O diabetes insipidus   . Hepatitis C    Completed therapy with Epclusa ~ 2018.  suspect therapy overseen by [facility] in [place], [place]  . History of adrenal insufficiency   . History of ARDS   . History of blood transfusion   . Hypothyroidism   . Kidney disease    stage 3  . Neuropathy   . Overdose 12/2009  . Peripheral neuropathy   . Personality disorder (HCC)   . PTSD (post-traumatic stress disorder)    SECONDARY TO WAR IN [place]  . Schizoaffective disorder [facility])     Patient Active Problem List   Diagnosis Date Noted  . GI bleed 08/07/2018  . Acute abdominal pain   . Cirrhosis of liver with ascites (HCC)   . Generalized abdominal pain   . Hematemesis with nausea   . Evaluation by psychiatric service required   . Acute upper gastrointestinal bleeding 10/15/2017  . PTSD (post-traumatic stress disorder)   . Hypothyroidism   . History of adrenal insufficiency   . H/O diabetes  insipidus   . Schizoaffective disorder (HCC) 11/08/2011  . Personality disorder (HCC)   . Anemia   . Ascites   . Liver disease   . Hepatitis C   . Kidney disease   . GERD (gastroesophageal reflux disease)   . Psychosis (HCC) 11/07/2011    Past Surgical History:  Procedure Laterality Date  . CERVICAL SPINE SURGERY  unknown  . RIGHT HIP SURGERY  1996  . US GUIDED LEFT THORACENTESIS  01/21/2010        Home Medications    Prior to Admission medications   Medication Sig Start Date End Date Taking? Authorizing Provider  [employer] D3 2000 units CAPS Take 2,000 Units by mouth daily. 10/23/17   [provider]  diphenhydrAMINE (BENADRYL) 25 MG tablet Take 25 mg by mouth at bedtime.     [provider]  fluPHENAZine (PROLIXIN) 5 MG tablet Take 5 mg by mouth 3 (three) times daily.    [provider]  gabapentin (NEURONTIN) 100 MG capsule Take 200 mg by mouth 3 (three) times daily. 07/21/18   [provider]  LORazepam (ATIVAN) 0.5 MG tablet Take 0.5 mg by mouth at bedtime.     [provider]  meloxicam (MOBIC) 15 MG tablet Take 15 mg by mouth daily. 07/06/18  [provider]  Oxycodone HCl 10 MG TABS Take 10 mg by mouth every 8 (eight) hours. 10/18/18   [provider]  predniSONE (DELTASONE) 50 MG tablet Take 1 tablet (50 mg total) by mouth daily. 11/26/18   Lawyer, Christopher, PA-C  SENSIPAR 30 MG tablet Take 30 mg by mouth daily. 07/25/17   [provider]  trihexyphenidyl (ARTANE) 2 MG tablet Take 2 mg by mouth 3 (three) times daily.    [provider]  colchicine 0.6 MG tablet Take 1 tablet (0.6 mg total) by mouth daily. Patient not taking: Reported on 11/05/2018 08/26/18 12/31/18  Eustaquio Maize, PA-C  pantoprazole (PROTONIX) 40 MG tablet Take 1 tablet (40 mg total) by mouth 2 (two) times daily. Patient not taking: Reported on 10/01/2018 10/18/17 12/31/18  Erick Colace, NP    Family History Family History   Problem Relation Age of Onset  . Cancer Mother   . Heart attack Father     Social History Social History   Tobacco Use  . Smoking status: Current Every Day Smoker    Packs/day: 1.00    Years: 25.00    Pack years: 25.00    Types: Cigarettes  . Smokeless tobacco: Never Used  Substance Use Topics  . Alcohol use: No    Comment: Pt denies   . Drug use: No    Comment: Pt denies     Allergies   Patient has no known allergies.   Review of Systems Review of Systems  Constitutional: Negative for activity change.  Musculoskeletal: Positive for arthralgias. Negative for back pain, gait problem, joint swelling and neck pain.  Skin: Negative for wound.  Neurological: Negative for weakness and numbness.     Physical Exam Updated Vital Signs BP (!) 105/47   Pulse 65   Temp 98.5 F (36.9 C) (Oral)   Resp 18   Ht 6\' 6"  (1.981 m)   Wt 99.8 kg   SpO2 98%   BMI 25.42 kg/m   Physical Exam Vitals signs and nursing note reviewed.  Constitutional:      Appearance: He is well-developed.  HENT:     Head: Normocephalic and atraumatic.  Eyes:     General:        Right eye: No discharge.        Left eye: No discharge.     Conjunctiva/sclera: Conjunctivae normal.  Neck:     Musculoskeletal: Normal range of motion and neck supple.  Cardiovascular:     Rate and Rhythm: Normal rate and regular rhythm.     Heart sounds: Murmur present.  Pulmonary:     Effort: Pulmonary effort is normal.     Breath sounds: Normal breath sounds.  Abdominal:     Palpations: Abdomen is soft.     Tenderness: There is no abdominal tenderness.  Musculoskeletal:     Comments: Patient moves all of his extremities.  He does not have any areas of swelling or erythema associated with any of the joints of the upper or lower extremities.  He has a small abrasion over olecranon on the left side which appears to be in the process of healing.  No signs of superinfection.  Skin:    General: Skin is warm and  dry.  Neurological:     Mental Status: He is alert.      ED Treatments / Results  Labs (all labs ordered are listed, but only abnormal results are displayed) Labs Reviewed - No data to display  EKG  None  Radiology Dg Elbow Complete Right (3+view)  Result Date: 12/29/2018 CLINICAL DATA:  Knee and elbow pain after fall EXAM: RIGHT ELBOW - COMPLETE 3+ VIEW COMPARISON:  11/26/2018 FINDINGS: Suspected prominent elbow effusion. No definitive fracture lucency. Degenerative changes on the radial side of the joint with chronic appearing irregularity and probable subarticular cystic change of the capitellum. Spurring at the radial head neck junction. Diffuse soft tissue swelling. IMPRESSION: 1. Diffuse soft tissue swelling with suspected elbow effusion. No definitive lucency identified 2. Arthritis of the elbow Electronically Signed   By: Jasmine Pang M.D.   On: 12/29/2018 21:08   Dg Knee Complete 4 Views Right  Result Date: 12/29/2018 CLINICAL DATA:  Fall with knee pain EXAM: RIGHT KNEE - COMPLETE 4+ VIEW COMPARISON:  04/17/2015 FINDINGS: No large knee effusion. Mild to moderate patellofemoral, medial and lateral joint space degenerative change. Articular surface irregularity at the medial tibial plateau. No dislocation IMPRESSION: 1. Articular surface irregularity at the medial tibial plateaus questionable for age indeterminate fracture 2. Tricompartment arthritis of the knee Electronically Signed   By: Jasmine Pang M.D.   On: 12/29/2018 21:10    Procedures Procedures (including critical care time)  Medications Ordered in ED Medications  ketorolac (TORADOL) injection 30 mg (has no administration in time range)     Initial Impression / Assessment and Plan / ED Course  I have reviewed the triage vital signs and the nursing notes.  Pertinent labs & imaging results that were available during my care of the patient were reviewed by me and considered in my medical decision making (see chart  for details).        Patient seen and examined.  Patient here with complaint of generalized joint pain.  No signs of septic arthritis or infection on my exam.  Patient should have chronic pain medication but states he is out.  Discussed with patient I will be unable to give narcotics at his visit tonight.  Will give shot of IM Toradol.  Plan to discharged with PCP follow-up.  Vital signs reviewed and are as follows: BP (!) 105/47   Pulse 65   Temp 98.5 F (36.9 C) (Oral)   Resp 18   Ht 6\' 6"  (1.981 m)   Wt 99.8 kg   SpO2 98%   BMI 25.42 kg/m    Final Clinical Impressions(s) / ED Diagnoses   Final diagnoses:  Generalized pain   Patient with generalized joint pain.  No focal areas of pain or swelling.  Suspect chronic pain.  Treated as above.  Patient has follow-up.  ED Discharge Orders    None       , Renne Crigler 12/31/18 1912    Tegeler, 03/02/19, MD 12/31/18 [phone]

## 2018-12-31 NOTE — ED Provider Notes (Signed)
[facility] DEPT Provider Note   CSN: [id number] Arrival date & time: 12/31/18  2145     History   Chief Complaint Chief Complaint  Patient presents with  . Pain    HPI [name] is a 58 y.o. male presenting for evaluation of generalized pain.  Patient states has a history of chronic pain.  He is oxycodone 10 mg 3 times a day, but took only he supposed to for increased drinking pain, and this may now really.  He has not taken anything for pain at home.  He was seen earlier today, given a Toradol shot with an improvement.  States he is here because he can worsening pain.  He denies recent fall, trauma, injury.  He denies fevers, chills, redness or swelling.  Pain is in bilateral legs and arms, although worse on the right side.   Additional history obtained from chart review.  Patient seen previously for similar.  Often given a dose premedication ED, encouraged follow-up with pain management.     HPI  Past Medical History:  Diagnosis Date  . Anemia   . Ascites   . Chronic leg pain   . Cirrhosis ([place])   . GERD (gastroesophageal reflux disease)   . H/O diabetes insipidus   . Hepatitis C    Completed therapy with Epclusa ~ 2018.  suspect therapy overseen by [facility] in [place], [place]  . History of adrenal insufficiency   . History of ARDS   . History of blood transfusion   . Hypothyroidism   . Kidney disease    stage 3  . Neuropathy   . Overdose 12/2009  . Peripheral neuropathy   . Personality disorder ([place])   . PTSD (post-traumatic stress disorder)    SECONDARY TO WAR IN [place]  . Schizoaffective disorder [facility])     Patient Active Problem List   Diagnosis Date Noted  . GI bleed 08/07/2018  . Acute abdominal pain   . Cirrhosis of liver with ascites ([place])   . Generalized abdominal pain   . Hematemesis with nausea   . Evaluation by psychiatric service required   . Acute upper gastrointestinal bleeding 10/15/2017  .  PTSD (post-traumatic stress disorder)   . Hypothyroidism   . History of adrenal insufficiency   . H/O diabetes insipidus   . Schizoaffective disorder ([place]) 11/08/2011  . Personality disorder ([place])   . Anemia   . Ascites   . Liver disease   . Hepatitis C   . Kidney disease   . GERD (gastroesophageal reflux disease)   . Psychosis ([place]) 11/07/2011    Past Surgical History:  Procedure Laterality Date  . CERVICAL SPINE SURGERY  unknown  . RIGHT HIP SURGERY  1996  . US GUIDED LEFT THORACENTESIS  01/21/2010        Home Medications    Prior to Admission medications   Medication Sig Start Date End Date Taking? Authorizing Provider  [employer] D3 2000 units CAPS Take 2,000 Units by mouth daily. 10/23/17   [provider]  diphenhydrAMINE (BENADRYL) 25 MG tablet Take 25 mg by mouth at bedtime.     [provider]  fluPHENAZine (PROLIXIN) 5 MG tablet Take 5 mg by mouth 3 (three) times daily.    [provider]  gabapentin (NEURONTIN) 100 MG capsule Take 200 mg by mouth 3 (three) times daily. 07/21/18   [provider]  LORazepam (ATIVAN) 0.5 MG tablet Take 0.5 mg by mouth at bedtime.  [provider]  meloxicam (MOBIC) 15 MG tablet Take 15 mg by mouth daily. 07/06/18   [provider]  Oxycodone HCl 10 MG TABS Take 10 mg by mouth every 8 (eight) hours. 10/18/18   [provider]  predniSONE (DELTASONE) 50 MG tablet Take 1 tablet (50 mg total) by mouth daily. 11/26/18   Lawyer, Christopher, PA-C  SENSIPAR 30 MG tablet Take 30 mg by mouth daily. 07/25/17   [provider]  trihexyphenidyl (ARTANE) 2 MG tablet Take 2 mg by mouth 3 (three) times daily.    [provider]  colchicine 0.6 MG tablet Take 1 tablet (0.6 mg total) by mouth daily. Patient not taking: Reported on 11/05/2018 08/26/18 12/31/18  Tanda Rockers, PA-C  pantoprazole (PROTONIX) 40 MG tablet Take 1 tablet (40 mg total) by mouth 2 (two) times daily. Patient  not taking: Reported on 10/01/2018 10/18/17 12/31/18  Simonne Martinet, NP    Family History Family History  Problem Relation Age of Onset  . Cancer Mother   . Heart attack Father     Social History Social History   Tobacco Use  . Smoking status: Current Every Day Smoker    Packs/day: 1.00    Years: 25.00    Pack years: 25.00    Types: Cigarettes  . Smokeless tobacco: Never Used  Substance Use Topics  . Alcohol use: No    Comment: Pt denies   . Drug use: No    Comment: Pt denies     Allergies   Patient has no known allergies.   Review of Systems Review of Systems  Constitutional: Negative for fever.  Musculoskeletal: Positive for arthralgias. Negative for joint swelling.  Neurological: Negative for numbness.  Hematological: Does not bruise/bleed easily.     Physical Exam Updated Vital Signs BP (!) 153/76 (BP Location: Right Arm)   Pulse 64   Temp 98.6 F (37 C) (Oral)   Resp 18   SpO2 99%   Physical Exam Vitals signs and nursing note reviewed.  Constitutional:      General: He is not in acute distress.    Appearance: He is well-developed.     Comments: Sitting in the bed in no acute distress  HENT:     Head: Normocephalic and atraumatic.  Neck:     Musculoskeletal: Normal range of motion.  Cardiovascular:     Rate and Rhythm: Normal rate and regular rhythm.     Pulses: Normal pulses.  Pulmonary:     Effort: Pulmonary effort is normal.  Abdominal:     General: There is no distension.  Musculoskeletal: Normal range of motion.     Comments: Full active range of motion of upper and lower extremities.  No erythema, warmth, or swelling of any joint.  Radial pedal pulses intact.  Sensation intact x4  Skin:    General: Skin is warm.     Capillary Refill: Capillary refill takes less than 2 seconds.     Findings: No rash.  Neurological:     Mental Status: He is alert and oriented to person, place, and time.      ED Treatments / Results  Labs (all  labs ordered are listed, but only abnormal results are displayed) Labs Reviewed - No data to display  EKG None  Radiology No results found.  Procedures Procedures (including critical care time)  Medications Ordered in ED Medications  oxyCODONE-acetaminophen (PERCOCET/ROXICET) 5-325 MG per tablet 1 tablet (1 tablet Oral Given 12/31/18 2314)  Initial Impression / Assessment and Plan / ED Course  I have reviewed the triage vital signs and the nursing notes.  Pertinent labs & imaging results that were available during my care of the patient were reviewed by me and considered in my medical decision making (see chart for details).        Patient presenting for evaluation of generalized pain.  Physical exam shows pt who appears nontoxic.  No sign of septic joint.  No sign of fall or injury.  Patient is neurovascularly intact.  Pain is chronic per history and chart review.  Discussed with patient my concerns for his repeated visits to the emergency room for pain control.  Discussed that this is not the appropriate facility for pain management. Discussed the importance of f/u with his pain management doctor, and taking his pain medications as prescribed. Will give single dose of pain medicine today per precedent, but discussed that he may be denied pain medication in the future if he continues to use the ED for narcotics. At this time, pt appears safe for d/c. Return precautions given. Pt states he understands and agrees to plan.    Final Clinical Impressions(s) / ED Diagnoses   Final diagnoses:  Chronic pain syndrome    ED Discharge Orders    None       Alveria Apley, PA-C 12/31/18 2350    Tegeler, Canary Brim, MD 12/31/18 [phone]

## 2018-12-31 NOTE — ED Triage Notes (Signed)
Patient presents to the ED with pain all over. Patient seen for the same today and discharged earlier.

## 2018-12-31 NOTE — Discharge Instructions (Addendum)
Call your doctor tomorrow to discuss further management of your pain. Return to the emergency room with any new, worsening, concerning symptoms.

## 2019-01-01 ENCOUNTER — Other Ambulatory Visit: Payer: Self-pay

## 2019-01-01 ENCOUNTER — Encounter (HOSPITAL_COMMUNITY): Payer: Self-pay

## 2019-01-01 DIAGNOSIS — M79606 Pain in leg, unspecified: Secondary | ICD-10-CM | POA: Insufficient documentation

## 2019-01-01 DIAGNOSIS — Z5321 Procedure and treatment not carried out due to patient leaving prior to being seen by health care provider: Secondary | ICD-10-CM | POA: Insufficient documentation

## 2019-01-01 NOTE — ED Triage Notes (Signed)
Pt arrived with complaints of bilateral leg pain, pt was seen here twice yesterday for same. Requesting oxycodone that he recieved last shift.

## 2019-01-02 ENCOUNTER — Emergency Department (HOSPITAL_COMMUNITY)
Admission: EM | Admit: 2019-01-02 | Discharge: 2019-01-02 | Disposition: A | Discharge status: Home / Self Care | Payer: Medicaid Other | Attending: Emergency Medicine | Admitting: Emergency Medicine

## 2019-01-02 NOTE — ED Notes (Signed)
Pt called for rooming but did not answer. This Probation officer checked outside and the lobby. Pt not in [place].

## 2019-01-04 ENCOUNTER — Other Ambulatory Visit: Payer: Self-pay

## 2019-01-04 ENCOUNTER — Encounter (HOSPITAL_COMMUNITY): Payer: Self-pay | Admitting: Emergency Medicine

## 2019-01-04 ENCOUNTER — Emergency Department (HOSPITAL_COMMUNITY)
Admission: EM | Admit: 2019-01-04 | Discharge: 2019-01-04 | Disposition: A | Discharge status: Home / Self Care | Payer: Medicaid Other | Attending: Emergency Medicine | Admitting: Emergency Medicine

## 2019-01-04 ENCOUNTER — Emergency Department (HOSPITAL_COMMUNITY): Payer: Medicaid Other

## 2019-01-04 DIAGNOSIS — Z79899 Other long term (current) drug therapy: Secondary | ICD-10-CM | POA: Diagnosis not present

## 2019-01-04 DIAGNOSIS — W19XXXD Unspecified fall, subsequent encounter: Secondary | ICD-10-CM | POA: Diagnosis not present

## 2019-01-04 DIAGNOSIS — S62232D Other displaced fracture of base of first metacarpal bone, left hand, subsequent encounter for fracture with routine healing: Secondary | ICD-10-CM | POA: Diagnosis not present

## 2019-01-04 DIAGNOSIS — M79604 Pain in right leg: Secondary | ICD-10-CM | POA: Insufficient documentation

## 2019-01-04 DIAGNOSIS — G8929 Other chronic pain: Secondary | ICD-10-CM

## 2019-01-04 DIAGNOSIS — E1122 Type 2 diabetes mellitus with diabetic chronic kidney disease: Secondary | ICD-10-CM | POA: Insufficient documentation

## 2019-01-04 DIAGNOSIS — E039 Hypothyroidism, unspecified: Secondary | ICD-10-CM | POA: Insufficient documentation

## 2019-01-04 DIAGNOSIS — S62232A Other displaced fracture of base of first metacarpal bone, left hand, initial encounter for closed fracture: Secondary | ICD-10-CM

## 2019-01-04 DIAGNOSIS — F17210 Nicotine dependence, cigarettes, uncomplicated: Secondary | ICD-10-CM | POA: Insufficient documentation

## 2019-01-04 DIAGNOSIS — N183 Chronic kidney disease, stage 3 unspecified: Secondary | ICD-10-CM | POA: Insufficient documentation

## 2019-01-04 MED ORDER — OXYCODONE HCL 5 MG PO TABS
10.0000 mg | ORAL_TABLET | Freq: Once | ORAL | Status: AC
  Administered 2019-01-04: 10 mg via ORAL
  Filled 2019-01-04: qty 2

## 2019-01-04 NOTE — ED Provider Notes (Signed)
[facility] EMERGENCY DEPARTMENT Provider Note   CSN: [id number] Arrival date & time: 01/04/19  1619     History   Chief Complaint Chief Complaint  Patient presents with  . Hand Pain  . Arthritis    HPI Yoniel Arkwright is a 58 y.o. male.     HPI   58 year old male presents today with complaints of pain to his right leg.  Patient also notes a fracture to his left thumb.  He notes that he is currently seen at the pain clinic and has run out of his oxycodone 10 mg.  He notes his right leg is bothering him this is chronic in nature unchanged with no recent trauma to the leg.  He would like a dose of his oxycodone.  On exam patient has bruising and instability of his left thumb.  Patient notes he suffered a fall yesterday.  He notes he was seen at [facility] this morning where he had a splint placed for fracture.  He notes he is unable to use his cane and walk so he took the splint off.  He denies significant pain to the thumb or hand and has range of motion at the MCP with sensation intact.  Past Medical History:  Diagnosis Date  . Anemia   . Ascites   . Chronic leg pain   . Cirrhosis (HCC)   . GERD (gastroesophageal reflux disease)   . H/O diabetes insipidus   . Hepatitis C    Completed therapy with Epclusa ~ 2018.  suspect therapy overseen by [facility] clinic in [place], [place]  . History of adrenal insufficiency   . History of ARDS   . History of blood transfusion   . Hypothyroidism   . Kidney disease    stage 3  . Neuropathy   . Overdose 12/2009  . Peripheral neuropathy   . Personality disorder (HCC)   . PTSD (post-traumatic stress disorder)    SECONDARY TO WAR IN [place]  . Schizoaffective disorder [facility])     Patient Active Problem List   Diagnosis Date Noted  . GI bleed 08/07/2018  . Acute abdominal pain   . Cirrhosis of liver with ascites (HCC)   . Generalized abdominal pain   . Hematemesis with nausea   . Evaluation by psychiatric  service required   . Acute upper gastrointestinal bleeding 10/15/2017  . PTSD (post-traumatic stress disorder)   . Hypothyroidism   . History of adrenal insufficiency   . H/O diabetes insipidus   . Schizoaffective disorder (HCC) 11/08/2011  . Personality disorder (HCC)   . Anemia   . Ascites   . Liver disease   . Hepatitis C   . Kidney disease   . GERD (gastroesophageal reflux disease)   . Psychosis (HCC) 11/07/2011    Past Surgical History:  Procedure Laterality Date  . CERVICAL SPINE SURGERY  unknown  . RIGHT HIP SURGERY  1996  . US GUIDED LEFT THORACENTESIS  01/21/2010        Home Medications    Prior to Admission medications   Medication Sig Start Date End Date Taking? Authorizing Provider  [employer] D3 2000 units CAPS Take 2,000 Units by mouth daily. 10/23/17   [provider]  diphenhydrAMINE (BENADRYL) 25 MG tablet Take 25 mg by mouth at bedtime.     [provider]  fluPHENAZine (PROLIXIN) 5 MG tablet Take 5 mg by mouth 3 (three) times daily.    [provider]  gabapentin (NEURONTIN) 100  MG capsule Take 200 mg by mouth 3 (three) times daily. 07/21/18   [provider]  LORazepam (ATIVAN) 0.5 MG tablet Take 0.5 mg by mouth at bedtime.     [provider]  meloxicam (MOBIC) 15 MG tablet Take 15 mg by mouth daily. 07/06/18   [provider]  Oxycodone HCl 10 MG TABS Take 10 mg by mouth every 8 (eight) hours. 10/18/18   [provider]  predniSONE (DELTASONE) 50 MG tablet Take 1 tablet (50 mg total) by mouth daily. 11/26/18   Lawyer, Christopher, PA-C  SENSIPAR 30 MG tablet Take 30 mg by mouth daily. 07/25/17   [provider]  trihexyphenidyl (ARTANE) 2 MG tablet Take 2 mg by mouth 3 (three) times daily.    [provider]  colchicine 0.6 MG tablet Take 1 tablet (0.6 mg total) by mouth daily. Patient not taking: Reported on 11/05/2018 08/26/18 12/31/18  Eustaquio Maize, PA-C  pantoprazole (PROTONIX) 40 MG  tablet Take 1 tablet (40 mg total) by mouth 2 (two) times daily. Patient not taking: Reported on 10/01/2018 10/18/17 12/31/18  Erick Colace, NP    Family History Family History  Problem Relation Age of Onset  . Cancer Mother   . Heart attack Father     Social History Social History   Tobacco Use  . Smoking status: Current Every Day Smoker    Packs/day: 1.00    Years: 25.00    Pack years: 25.00    Types: Cigarettes  . Smokeless tobacco: Never Used  Substance Use Topics  . Alcohol use: No    Comment: Pt denies   . Drug use: No    Comment: Pt denies     Allergies   Patient has no known allergies.   Review of Systems Review of Systems  All other systems reviewed and are negative.    Physical Exam Updated Vital Signs BP 126/73   Pulse 64   Temp 97.8 F (36.6 C) (Oral)   Resp 16   SpO2 100%   Physical Exam Vitals signs and nursing note reviewed.  Constitutional:      Appearance: He is well-developed.  HENT:     Head: Normocephalic and atraumatic.  Eyes:     General: No scleral icterus.       Right eye: No discharge.        Left eye: No discharge.     Conjunctiva/sclera: Conjunctivae normal.     Pupils: Pupils are equal, round, and reactive to light.  Neck:     Musculoskeletal: Normal range of motion.     Vascular: No JVD.     Trachea: No tracheal deviation.  Pulmonary:     Effort: Pulmonary effort is normal.     Breath sounds: No stridor.  Musculoskeletal:     Comments: Bruising and edema noted to the left thumb and hand with instability at the proximal metacarpal.  Patient has sensation and flexion extension at the DIP and PIP  Neurological:     Mental Status: He is alert and oriented to person, place, and time.     Coordination: Coordination normal.  Psychiatric:        Behavior: Behavior normal.        Thought Content: Thought content normal.        Judgment: Judgment normal.      ED Treatments / Results  Labs (all labs ordered are  listed, but only abnormal results are displayed) Labs Reviewed - No data to display  EKG None  Radiology No results found.  Procedures Procedures (including critical care time)  Medications Ordered in ED Medications  oxyCODONE (Oxy IR/ROXICODONE) immediate release tablet 10 mg (has no administration in time range)     Initial Impression / Assessment and Plan / ED Course  I have reviewed the triage vital signs and the nursing notes.  Pertinent labs & imaging results that were available during my care of the patient were reviewed by me and considered in my medical decision making (see chart for details).        58 year old male presents today for pain medication.  Interestingly the more severe pain he is experiencing is in his right leg which is chronic in nature, he has a significant fracture to his left first metacarpal.  I discussed with the patient that he needed to wear his splint and we would re-splint him, he he is adamant that he does not want a splint on this hand as it interferes with his ability to use the cane in the left hand.  He notes he cannot use the cane in the right hand as he is too weak, he does not want to use a walker or any other assistive device.  Patient is adamant that he would just like his daily dose of pain medication and no further management of his injury to his left hand.  He understands by not wearing a splint this wound will not heal will become chronic in nature and he will be disabled in that hand.  Patient is given strict return precautions and encouraged follow-up with orthopedist, he verbalized understanding and agreement to today's plan had no further questions or concerns at time of discharge.  Final Clinical Impressions(s) / ED Diagnoses   Final diagnoses:  Chronic pain of right lower extremity  Closed displaced fracture of base of first metacarpal bone of left hand, unspecified fracture morphology, initial encounter    ED Discharge Orders     None       Rosalio Loud 01/04/19 1830    Gerhard Munch, MD 01/04/19 2322

## 2019-01-04 NOTE — ED Triage Notes (Signed)
Pt BIB [facility] from home, c/o "pain all over", reports he is seen at the pain clinic but is out of his pain medicine. Pt's left hand swollen and bruised, states he fell "a few days ago". Denies hitting his head, answering all questions appropriately.

## 2019-01-04 NOTE — Discharge Instructions (Addendum)
Please read the attached information.  As we discussed your fracture is very severe in your left hand and if left untreated you will have a permanent deformity and inability to use the hand and thumb.  I encourage you to follow-up with orthopedics for ongoing management of this.  Please return at any point for further evaluation and management.

## 2019-01-04 NOTE — ED Notes (Signed)
Patient verbalizes understanding of discharge instructions. Opportunity for questioning and answers were provided. Armband removed by staff, pt discharged from ED via wheelchair to home.  

## 2019-01-04 NOTE — ED Notes (Signed)
Patient returned from X-ray 

## 2019-01-04 NOTE — ED Notes (Signed)
Patient transported to X-ray 

## 2019-01-05 ENCOUNTER — Emergency Department (HOSPITAL_COMMUNITY)
Admission: EM | Admit: 2019-01-05 | Discharge: 2019-01-05 | Disposition: A | Discharge status: Home / Self Care | Payer: Medicaid Other | Attending: Emergency Medicine | Admitting: Emergency Medicine

## 2019-01-05 ENCOUNTER — Encounter (HOSPITAL_COMMUNITY): Payer: Self-pay | Admitting: Obstetrics and Gynecology

## 2019-01-05 DIAGNOSIS — Z79899 Other long term (current) drug therapy: Secondary | ICD-10-CM | POA: Diagnosis not present

## 2019-01-05 DIAGNOSIS — F17210 Nicotine dependence, cigarettes, uncomplicated: Secondary | ICD-10-CM | POA: Diagnosis not present

## 2019-01-05 DIAGNOSIS — E039 Hypothyroidism, unspecified: Secondary | ICD-10-CM | POA: Diagnosis not present

## 2019-01-05 DIAGNOSIS — R52 Pain, unspecified: Secondary | ICD-10-CM

## 2019-01-05 MED ORDER — OXYCODONE HCL 5 MG PO TABS
10.0000 mg | ORAL_TABLET | Freq: Once | ORAL | Status: AC
  Administered 2019-01-05: 10 mg via ORAL
  Filled 2019-01-05: qty 2

## 2019-01-05 NOTE — ED Provider Notes (Signed)
[facility]-EMERGENCY DEPT Provider Note   CSN: [id number] Arrival date & time: 01/05/19  1808     History   Chief Complaint Chief Complaint  Patient presents with  . Generalized Body Aches    HPI [name] is a 58 y.o. male.     HPI Patient with chronic pain, multiple other medical problems presents with diffuse pain, in a typical distribution, though with additional pain in his left thumb. Patient was seen yesterday at our affiliated facility due to hip pain, though it was noted that time that the patient had a fall, prior to that and had diagnosis of thumb fracture, but he removed his splint, due to its adding difficulty to his walking. Several times asked him about his thumb pain but he perseverated on his diffuse lower extremity pain which is typical, not improved, as he no longer has his oxycodone. He denies other new interval complaints, additional falls since breaking his thumb, defers my recommendation for replacement of his thumb cast.   Past Medical History:  Diagnosis Date  . Anemia   . Ascites   . Chronic leg pain   . Cirrhosis (HCC)   . GERD (gastroesophageal reflux disease)   . H/O diabetes insipidus   . Hepatitis C    Completed therapy with Epclusa ~ 2018.  suspect therapy overseen by [facility] clinic in [place], [place]  . History of adrenal insufficiency   . History of ARDS   . History of blood transfusion   . Hypothyroidism   . Kidney disease    stage 3  . Neuropathy   . Overdose 12/2009  . Peripheral neuropathy   . Personality disorder (HCC)   . PTSD (post-traumatic stress disorder)    SECONDARY TO WAR IN [place]  . Schizoaffective disorder [facility])     Patient Active Problem List   Diagnosis Date Noted  . GI bleed 08/07/2018  . Acute abdominal pain   . Cirrhosis of liver with ascites (HCC)   . Generalized abdominal pain   . Hematemesis with nausea   . Evaluation by psychiatric service required   . Acute upper  gastrointestinal bleeding 10/15/2017  . PTSD (post-traumatic stress disorder)   . Hypothyroidism   . History of adrenal insufficiency   . H/O diabetes insipidus   . Schizoaffective disorder (HCC) 11/08/2011  . Personality disorder (HCC)   . Anemia   . Ascites   . Liver disease   . Hepatitis C   . Kidney disease   . GERD (gastroesophageal reflux disease)   . Psychosis (HCC) 11/07/2011    Past Surgical History:  Procedure Laterality Date  . CERVICAL SPINE SURGERY  unknown  . RIGHT HIP SURGERY  1996  . US GUIDED LEFT THORACENTESIS  01/21/2010        Home Medications    Prior to Admission medications   Medication Sig Start Date End Date Taking? Authorizing Provider  [employer] D3 2000 units CAPS Take 2,000 Units by mouth daily. 10/23/17   [provider]  diphenhydrAMINE (BENADRYL) 25 MG tablet Take 25 mg by mouth at bedtime.     [provider]  fluPHENAZine (PROLIXIN) 5 MG tablet Take 5 mg by mouth 3 (three) times daily.    [provider]  gabapentin (NEURONTIN) 100 MG capsule Take 200 mg by mouth 3 (three) times daily. 07/21/18   [provider]  LORazepam (ATIVAN) 0.5 MG tablet Take 0.5 mg by mouth at bedtime.     [provider]  meloxicam (MOBIC) 15 MG tablet Take 15 mg by mouth daily. 07/06/18   [provider]  Oxycodone HCl 10 MG TABS Take 10 mg by mouth every 8 (eight) hours. 10/18/18   [provider]  predniSONE (DELTASONE) 50 MG tablet Take 1 tablet (50 mg total) by mouth daily. 11/26/18   Lawyer, Christopher, PA-C  SENSIPAR 30 MG tablet Take 30 mg by mouth daily. 07/25/17   [provider]  trihexyphenidyl (ARTANE) 2 MG tablet Take 2 mg by mouth 3 (three) times daily.    [provider]  colchicine 0.6 MG tablet Take 1 tablet (0.6 mg total) by mouth daily. Patient not taking: Reported on 11/05/2018 08/26/18 12/31/18  Tanda RockersVenter, Margaux, PA-C  pantoprazole (PROTONIX) 40 MG tablet Take 1 tablet (40 mg  total) by mouth 2 (two) times daily. Patient not taking: Reported on 10/01/2018 10/18/17 12/31/18  Simonne MartinetBabcock, Peter E, NP    Family History Family History  Problem Relation Age of Onset  . Cancer Mother   . Heart attack Father     Social History Social History   Tobacco Use  . Smoking status: Current Every Day Smoker    Packs/day: 1.00    Years: 25.00    Pack years: 25.00    Types: Cigarettes  . Smokeless tobacco: Never Used  Substance Use Topics  . Alcohol use: No    Comment: Pt denies   . Drug use: No    Comment: Pt denies     Allergies   Patient has no known allergies.   Review of Systems Review of Systems  Constitutional: Negative for fever.  Musculoskeletal: Positive for arthralgias. Negative for joint swelling.       New L thumb Fx  Skin: Positive for color change.  Neurological: Negative for numbness.  Hematological: Does not bruise/bleed easily.  Psychiatric/Behavioral: Positive for sleep disturbance. The patient is nervous/anxious.      Physical Exam Updated Vital Signs BP (!) 145/77 (BP Location: Left Arm)   Pulse 72   Temp 98 F (36.7 C) (Oral)   Resp 16   SpO2 100%   Physical Exam Vitals signs and nursing note reviewed.  Constitutional:      Appearance: He is well-developed.  HENT:     Head: Normocephalic and atraumatic.  Eyes:     General: No scleral icterus.       Right eye: No discharge.        Left eye: No discharge.     Conjunctiva/sclera: Conjunctivae normal.     Pupils: Pupils are equal, round, and reactive to light.  Neck:     Musculoskeletal: Normal range of motion.     Vascular: No JVD.     Trachea: No tracheal deviation.  Pulmonary:     Effort: Pulmonary effort is normal.     Breath sounds: No stridor.  Musculoskeletal:     Comments: Bruising and edema noted to the left thumb and hand with instability at the proximal metacarpal.  Patient has sensation and flexion extension at the DIP and PIP  Neurological:     Mental Status:  He is alert and oriented to person, place, and time.     Coordination: Coordination normal.  Psychiatric:        Behavior: Behavior normal.        Judgment: Judgment normal.     Comments: Patient withdrawn, but acknowledges his need to follow-up with psychiatry, has an appointment in 5 days.      ED Treatments /  Results   Radiology Dg Hand Complete Left  Result Date: 01/04/2019 CLINICAL DATA:  Left hand pain and swelling following a fall yesterday. EXAM: LEFT HAND - COMPLETE 3+ VIEW COMPARISON:  Right wrist dated 09/01/2017. FINDINGS: Comminuted fracture of the proximal shaft of the 5th metacarpal with impaction. Radial and ventral angulation of the proximal fragment. Associated diffuse dorsal soft tissue swelling. IMPRESSION: Comminuted 5th metacarpal fracture with impaction and angulation, as described above. Electronically Signed   By: Claudie Revering M.D.   On: 01/04/2019 18:34    Procedures Procedures (including critical care time)  Medications Ordered in ED Medications  oxyCODONE (Oxy IR/ROXICODONE) immediate release tablet 10 mg (has no administration in time range)     Initial Impression / Assessment and Plan / ED Course  I have reviewed the triage vital signs and the nursing notes.  Pertinent labs & imaging results that were available during my care of the patient were reviewed by me and considered in my medical decision making (see chart for details).  Patient with chronic pain presents with ongoing pain, no new complaints per Notably, the patient also has recent thumb fracture, has been noncompliant with splinting, and has refused both yesterday and today for repeat immobilization. Here, no new neurologic complaints, no evidence for hemodynamic compromise, no neurovascular dysfunction, though he has noted chronic pain issues, there is no indication for admission, additional studies. Patient notes that he will follow-up with primary care and psychiatry in the coming week.  Patient received 1 dose of pain medication he was discharged with outpatient follow-up.  Final Clinical Impressions(s) / ED Diagnoses   Final diagnoses:  Pain     Carmin Muskrat, MD 01/05/19 1907

## 2019-01-05 NOTE — ED Triage Notes (Signed)
Pt brought in by [facility] with c/o pain all over. Patient has been seen multiple times in the past week for the same.

## 2019-01-05 NOTE — Discharge Instructions (Addendum)
Please be sure to see your physicians on Tuesday.  Try to wear your wrist / hand splint at all times.

## 2019-01-06 ENCOUNTER — Emergency Department (HOSPITAL_COMMUNITY)
Admission: EM | Admit: 2019-01-06 | Discharge: 2019-01-06 | Disposition: A | Discharge status: Home / Self Care | Payer: Medicaid Other | Source: Home / Self Care | Attending: Emergency Medicine | Admitting: Emergency Medicine

## 2019-01-06 ENCOUNTER — Encounter (HOSPITAL_COMMUNITY): Payer: Self-pay

## 2019-01-06 ENCOUNTER — Other Ambulatory Visit: Payer: Self-pay

## 2019-01-06 ENCOUNTER — Encounter (HOSPITAL_COMMUNITY): Payer: Self-pay | Admitting: *Deleted

## 2019-01-06 ENCOUNTER — Emergency Department (HOSPITAL_COMMUNITY)
Admission: EM | Admit: 2019-01-06 | Discharge: 2019-01-06 | Disposition: A | Discharge status: Home / Self Care | Payer: Medicaid Other | Attending: Emergency Medicine | Admitting: Emergency Medicine

## 2019-01-06 ENCOUNTER — Emergency Department (HOSPITAL_COMMUNITY): Payer: Medicaid Other

## 2019-01-06 DIAGNOSIS — E039 Hypothyroidism, unspecified: Secondary | ICD-10-CM | POA: Insufficient documentation

## 2019-01-06 DIAGNOSIS — N183 Chronic kidney disease, stage 3 unspecified: Secondary | ICD-10-CM | POA: Insufficient documentation

## 2019-01-06 DIAGNOSIS — R4589 Other symptoms and signs involving emotional state: Secondary | ICD-10-CM | POA: Insufficient documentation

## 2019-01-06 DIAGNOSIS — F259 Schizoaffective disorder, unspecified: Secondary | ICD-10-CM | POA: Insufficient documentation

## 2019-01-06 DIAGNOSIS — G894 Chronic pain syndrome: Secondary | ICD-10-CM | POA: Insufficient documentation

## 2019-01-06 DIAGNOSIS — Z20828 Contact with and (suspected) exposure to other viral communicable diseases: Secondary | ICD-10-CM | POA: Insufficient documentation

## 2019-01-06 DIAGNOSIS — F17210 Nicotine dependence, cigarettes, uncomplicated: Secondary | ICD-10-CM | POA: Insufficient documentation

## 2019-01-06 DIAGNOSIS — R0789 Other chest pain: Secondary | ICD-10-CM | POA: Insufficient documentation

## 2019-01-06 DIAGNOSIS — R52 Pain, unspecified: Secondary | ICD-10-CM | POA: Diagnosis present

## 2019-01-06 DIAGNOSIS — Z79899 Other long term (current) drug therapy: Secondary | ICD-10-CM | POA: Diagnosis not present

## 2019-01-06 DIAGNOSIS — G8929 Other chronic pain: Secondary | ICD-10-CM

## 2019-01-06 LAB — CBC
HCT: 28.8 % — ABNORMAL LOW (ref 39.0–52.0)
Hemoglobin: 8.8 g/dL — ABNORMAL LOW (ref 13.0–17.0)
MCH: 29.8 pg (ref 26.0–34.0)
MCHC: 30.6 g/dL (ref 30.0–36.0)
MCV: 97.6 fL (ref 80.0–100.0)
Platelets: 104 10*3/uL — ABNORMAL LOW (ref 150–400)
RBC: 2.95 MIL/uL — ABNORMAL LOW (ref 4.22–5.81)
RDW: 18.9 % — ABNORMAL HIGH (ref 11.5–15.5)
WBC: 5.4 10*3/uL (ref 4.0–10.5)
nRBC: 0 % (ref 0.0–0.2)

## 2019-01-06 LAB — TROPONIN I (HIGH SENSITIVITY)
Troponin I (High Sensitivity): 10 ng/L (ref <18)
Troponin I (High Sensitivity): 10 ng/L (ref <18)

## 2019-01-06 LAB — BASIC METABOLIC PANEL
Anion gap: 8 (ref 5–15)
BUN: 27 mg/dL — ABNORMAL HIGH (ref 6–20)
CO2: 21 mmol/L — ABNORMAL LOW (ref 22–32)
Calcium: 8.6 mg/dL — ABNORMAL LOW (ref 8.9–10.3)
Chloride: 112 mmol/L — ABNORMAL HIGH (ref 98–111)
Creatinine, Ser: 2.26 mg/dL — ABNORMAL HIGH (ref 0.61–1.24)
GFR calc Af Amer: 36 mL/min — ABNORMAL LOW (ref >60)
GFR calc non Af Amer: 31 mL/min — ABNORMAL LOW (ref >60)
Glucose, Bld: 93 mg/dL (ref 70–99)
Potassium: 4.1 mmol/L (ref 3.5–5.1)
Sodium: 141 mmol/L (ref 135–145)

## 2019-01-06 LAB — SARS CORONAVIRUS 2 (TAT 6-24 HRS): SARS Coronavirus 2: NEGATIVE

## 2019-01-06 MED ORDER — OXYCODONE-ACETAMINOPHEN 5-325 MG PO TABS
2.0000 | ORAL_TABLET | Freq: Once | ORAL | Status: AC
  Administered 2019-01-06: 2 via ORAL
  Filled 2019-01-06: qty 2

## 2019-01-06 MED ORDER — OXYCODONE HCL 5 MG PO TABS
10.0000 mg | ORAL_TABLET | Freq: Once | ORAL | Status: AC
  Administered 2019-01-06: 07:00:00 10 mg via ORAL
  Filled 2019-01-06: qty 2

## 2019-01-06 NOTE — ED Provider Notes (Signed)
[facility]-EMERGENCY DEPT Provider Note   CSN: [id number] Arrival date & time: 01/06/19  0306     History   Chief Complaint Chief Complaint  Patient presents with  . Pain    HPI [name] is a 58 y.o. male.     [name] is a 58 y.o. male with a history of chronic pain and multiple other medical problems as detailed below.  He presents complaining of diffuse musculoskeletal pain that is in the typical distribution of his chronic pain.  He also has left thumb pain, was recently diagnosed with metacarpal fracture a few days ago, patient removed splint and has declined having this area resplinted due to difficulty using his cane.  Reports he plans to follow-up with orthopedics, and also has a follow-up with pain management doctor, but is out of his usual 10 mg oxycodone and has worsening of his chronic pain.  No fevers.  Aside from swelling over the left thumb, no other joint swelling.  No interval weakness or numbness.  Patient again defers recommendation for re-splinting of the thumb.  He was seen last night for similar presentation.  Given 1 dose of pain medication and discharged with outpatient follow-up.  Reports history of PTSD, reports some anxiety, denies any current SI, HI or AVH.     Past Medical History:  Diagnosis Date  . Anemia   . Ascites   . Chronic leg pain   . Cirrhosis (HCC)   . GERD (gastroesophageal reflux disease)   . H/O diabetes insipidus   . Hepatitis C    Completed therapy with Epclusa ~ 2018.  suspect therapy overseen by [facility] in [place], [place]  . History of adrenal insufficiency   . History of ARDS   . History of blood transfusion   . Hypothyroidism   . Kidney disease    stage 3  . Neuropathy   . Overdose 12/2009  . Peripheral neuropathy   . Personality disorder (HCC)   . PTSD (post-traumatic stress disorder)    SECONDARY TO WAR IN [place]  . Schizoaffective disorder [facility])     Patient Active Problem  List   Diagnosis Date Noted  . GI bleed 08/07/2018  . Acute abdominal pain   . Cirrhosis of liver with ascites (HCC)   . Generalized abdominal pain   . Hematemesis with nausea   . Evaluation by psychiatric service required   . Acute upper gastrointestinal bleeding 10/15/2017  . PTSD (post-traumatic stress disorder)   . Hypothyroidism   . History of adrenal insufficiency   . H/O diabetes insipidus   . Schizoaffective disorder (HCC) 11/08/2011  . Personality disorder (HCC)   . Anemia   . Ascites   . Liver disease   . Hepatitis C   . Kidney disease   . GERD (gastroesophageal reflux disease)   . Psychosis (HCC) 11/07/2011    Past Surgical History:  Procedure Laterality Date  . CERVICAL SPINE SURGERY  unknown  . RIGHT HIP SURGERY  1996  . US GUIDED LEFT THORACENTESIS  01/21/2010        Home Medications    Prior to Admission medications   Medication Sig Start Date End Date Taking? Authorizing Provider  [employer] D3 2000 units CAPS Take 2,000 Units by mouth daily. 10/23/17   [provider]  diphenhydrAMINE (BENADRYL) 25 MG tablet Take 25 mg by mouth at bedtime.     [provider]  fluPHENAZine (PROLIXIN) 5 MG tablet Take 5 mg by  mouth 3 (three) times daily.    [provider]  gabapentin (NEURONTIN) 100 MG capsule Take 200 mg by mouth 3 (three) times daily. 07/21/18   [provider]  LORazepam (ATIVAN) 0.5 MG tablet Take 0.5 mg by mouth at bedtime.     [provider]  meloxicam (MOBIC) 15 MG tablet Take 15 mg by mouth daily. 07/06/18   [provider]  Oxycodone HCl 10 MG TABS Take 10 mg by mouth every 8 (eight) hours. 10/18/18   [provider]  predniSONE (DELTASONE) 50 MG tablet Take 1 tablet (50 mg total) by mouth daily. 11/26/18   Lawyer, Christopher, PA-C  SENSIPAR 30 MG tablet Take 30 mg by mouth daily. 07/25/17   [provider]  trihexyphenidyl (ARTANE) 2 MG tablet Take 2 mg by mouth 3 (three) times daily.     [provider]  colchicine 0.6 MG tablet Take 1 tablet (0.6 mg total) by mouth daily. Patient not taking: Reported on 11/05/2018 08/26/18 12/31/18  Tanda Rockers, PA-C  pantoprazole (PROTONIX) 40 MG tablet Take 1 tablet (40 mg total) by mouth 2 (two) times daily. Patient not taking: Reported on 10/01/2018 10/18/17 12/31/18  Simonne Martinet, NP    Family History Family History  Problem Relation Age of Onset  . Cancer Mother   . Heart attack Father     Social History Social History   Tobacco Use  . Smoking status: Current Every Day Smoker    Packs/day: 1.00    Years: 25.00    Pack years: 25.00    Types: Cigarettes  . Smokeless tobacco: Never Used  Substance Use Topics  . Alcohol use: No    Comment: Pt denies   . Drug use: No    Comment: Pt denies     Allergies   Patient has no known allergies.   Review of Systems Review of Systems  Constitutional: Negative for chills and fever.  Musculoskeletal: Positive for arthralgias and joint swelling.       Recent L thumb fx  Skin: Positive for color change. Negative for wound.  Neurological: Negative for weakness and numbness.  Psychiatric/Behavioral: Negative for hallucinations and suicidal ideas. The patient is nervous/anxious.      Physical Exam Updated Vital Signs BP 127/85 (BP Location: Right Arm)   Pulse 69   Temp 98.1 F (36.7 C) (Oral)   SpO2 98%   Physical Exam Vitals signs and nursing note reviewed.  Constitutional:      General: He is not in acute distress.    Appearance: Normal appearance. He is well-developed and normal weight. He is not diaphoretic.     Comments: Sitting on the side of the bed in no acute distress.  HENT:     Head: Normocephalic and atraumatic.  Eyes:     General:        Right eye: No discharge.        Left eye: No discharge.  Neck:     Musculoskeletal: Neck supple.  Cardiovascular:     Rate and Rhythm: Normal rate and regular rhythm.     Pulses: Normal pulses.      Heart sounds: Normal heart sounds.  Pulmonary:     Effort: Pulmonary effort is normal. No respiratory distress.     Breath sounds: Normal breath sounds.  Musculoskeletal:     Comments: Bruising and edema to the left thumb, but with 2+ radial pulse and good capillary refill, normal sensation throughout the hand, normal extension and flexion  of the first finger.  Declines splinting. All other joints are supple and easily movable, all compartments are soft.  Neurological:     Mental Status: He is alert and oriented to person, place, and time.     Coordination: Coordination normal.     Comments: Speech is clear, able to follow commands Moves extremities without ataxia, coordination intact  Psychiatric:        Behavior: Behavior normal.     Comments: Patient withdrawn but denies SI or HI, has upcoming psych follow-up appointment, does not appear acutely decompensated      ED Treatments / Results  Labs (all labs ordered are listed, but only abnormal results are displayed) Labs Reviewed - No data to display  EKG None  Radiology Dg Hand Complete Left  Result Date: 01/04/2019 CLINICAL DATA:  Left hand pain and swelling following a fall yesterday. EXAM: LEFT HAND - COMPLETE 3+ VIEW COMPARISON:  Right wrist dated 09/01/2017. FINDINGS: Comminuted fracture of the proximal shaft of the 5th metacarpal with impaction. Radial and ventral angulation of the proximal fragment. Associated diffuse dorsal soft tissue swelling. IMPRESSION: Comminuted 5th metacarpal fracture with impaction and angulation, as described above. Electronically Signed   By: Claudie Revering M.D.   On: 01/04/2019 18:34    Procedures Procedures (including critical care time)  Medications Ordered in ED Medications  oxyCODONE (Oxy IR/ROXICODONE) immediate release tablet 10 mg (has no administration in time range)     Initial Impression / Assessment and Plan / ED Course  I have reviewed the triage vital signs and the nursing  notes.  Pertinent labs & imaging results that were available during my care of the patient were reviewed by me and considered in my medical decision making (see chart for details).  Patient presents with ongoing chronic pain, no new complaints, no neurologic deficits.  Patient seen a few days ago and diagnosed with left thumb fracture, has been noncompliant with splinting and declines immobilization today.  Has plans for orthopedic follow-up as well as follow-up with pain management.  He was seen last night for similar complaints.  He will be given 1 dose of his pain medication here in the ED and then discharged home with outpatient follow-up.  He has upcoming psychiatric follow-up as well, denies SI, HI or AVH and does not appear acutely decompensated.  Feel he is stable for outpatient follow-up.  Discharged home in good condition.  Final Clinical Impressions(s) / ED Diagnoses   Final diagnoses:  Chronic musculoskeletal pain    ED Discharge Orders    None       Jacqlyn Larsen, [place] 60/63/01 [id number]    Delora Fuel, MD 93/23/55 [phone]

## 2019-01-06 NOTE — ED Triage Notes (Signed)
Pt arrives via [facility] with c/o chronic pain. Pt having left hand pain (pt seen earlier for the same, has 5th meta carpel fracture)

## 2019-01-06 NOTE — ED Notes (Signed)
Discharge instructions discussed with pt. Pt verbalized understanding. Case worker to follow up with resources.

## 2019-01-06 NOTE — ED Notes (Signed)
AC to bring taxi voucher for pt's ride home.

## 2019-01-06 NOTE — ED Provider Notes (Signed)
[facility] EMERGENCY DEPARTMENT Provider Note   CSN: [id number] Arrival date & time: 01/06/19  1352     History   Chief Complaint Chief Complaint  Patient presents with  . Chest Pain    HPI [name] is a 58 y.o. male.     The history is provided by the patient and medical records. No language interpreter was used.     58 year old male with psychiatric illness including schizophrenia, personality disorder, hepatitis C, chronic pain brought here via EMS from [facility] for complaints of chest pain.  It is difficult to obtain history from patient.  He report acute onset of diffuse chest pain which started approximately an hour ago.  Pain is sharp, worse with palpation and rates a 6 out of 10.  He has no associated fever chills no lightheadedness or dizziness no productive cough or shortness of breath no nausea vomiting or diarrhea back pain no abdominal pain.  He is a poor historian.  He was seen at [facility] long ER last night for pain to his left hand in which he fell 2 days prior and suffered a left metacarpal fracture.  He remove his splint because he needs to use his hand to walk with a cane and does not want another splint placed during his ER visit.  He is not complaining of pain to his left hand at this time.  He denies any significant cardiac history.  He is an occasional smoker.  No prior history of PE or DVT.  Denies any recent sick contact with COVID-19.  Past Medical History:  Diagnosis Date  . Anemia   . Ascites   . Chronic leg pain   . Cirrhosis (HCC)   . GERD (gastroesophageal reflux disease)   . H/O diabetes insipidus   . Hepatitis C    Completed therapy with Epclusa ~ 2018.  suspect therapy overseen by [facility] in [place], [place]  . History of adrenal insufficiency   . History of ARDS   . History of blood transfusion   . Hypothyroidism   . Kidney disease    stage 3  . Neuropathy   . Overdose 12/2009  . Peripheral  neuropathy   . Personality disorder (HCC)   . PTSD (post-traumatic stress disorder)    SECONDARY TO WAR IN [place]  . Schizoaffective disorder [facility])     Patient Active Problem List   Diagnosis Date Noted  . GI bleed 08/07/2018  . Acute abdominal pain   . Cirrhosis of liver with ascites (HCC)   . Generalized abdominal pain   . Hematemesis with nausea   . Evaluation by psychiatric service required   . Acute upper gastrointestinal bleeding 10/15/2017  . PTSD (post-traumatic stress disorder)   . Hypothyroidism   . History of adrenal insufficiency   . H/O diabetes insipidus   . Schizoaffective disorder (HCC) 11/08/2011  . Personality disorder (HCC)   . Anemia   . Ascites   . Liver disease   . Hepatitis C   . Kidney disease   . GERD (gastroesophageal reflux disease)   . Psychosis (HCC) 11/07/2011    Past Surgical History:  Procedure Laterality Date  . CERVICAL SPINE SURGERY  unknown  . RIGHT HIP SURGERY  1996  . US GUIDED LEFT THORACENTESIS  01/21/2010        Home Medications    Prior to Admission medications   Medication Sig Start Date End Date Taking? Authorizing Provider  [employer] D3 2000 units  CAPS Take 2,000 Units by mouth daily. 10/23/17   [provider]  diphenhydrAMINE (BENADRYL) 25 MG tablet Take 25 mg by mouth at bedtime.     [provider]  fluPHENAZine (PROLIXIN) 5 MG tablet Take 5 mg by mouth 3 (three) times daily.    [provider]  gabapentin (NEURONTIN) 100 MG capsule Take 200 mg by mouth 3 (three) times daily. 07/21/18   [provider]  LORazepam (ATIVAN) 0.5 MG tablet Take 0.5 mg by mouth at bedtime.     [provider]  meloxicam (MOBIC) 15 MG tablet Take 15 mg by mouth daily. 07/06/18   [provider]  Oxycodone HCl 10 MG TABS Take 10 mg by mouth every 8 (eight) hours. 10/18/18   [provider]  predniSONE (DELTASONE) 50 MG tablet Take 1 tablet (50 mg total) by mouth daily. 11/26/18   Lawyer,  Christopher, PA-C  SENSIPAR 30 MG tablet Take 30 mg by mouth daily. 07/25/17   [provider]  trihexyphenidyl (ARTANE) 2 MG tablet Take 2 mg by mouth 3 (three) times daily.    [provider]  colchicine 0.6 MG tablet Take 1 tablet (0.6 mg total) by mouth daily. Patient not taking: Reported on 11/05/2018 08/26/18 12/31/18  Eustaquio Maize, PA-C  pantoprazole (PROTONIX) 40 MG tablet Take 1 tablet (40 mg total) by mouth 2 (two) times daily. Patient not taking: Reported on 10/01/2018 10/18/17 12/31/18  Erick Colace, NP    Family History Family History  Problem Relation Age of Onset  . Cancer Mother   . Heart attack Father     Social History Social History   Tobacco Use  . Smoking status: Current Every Day Smoker    Packs/day: 1.00    Years: 25.00    Pack years: 25.00    Types: Cigarettes  . Smokeless tobacco: Never Used  Substance Use Topics  . Alcohol use: No    Comment: Pt denies   . Drug use: No    Comment: Pt denies     Allergies   Patient has no known allergies.   Review of Systems Review of Systems  All other systems reviewed and are negative.    Physical Exam Updated Vital Signs BP 126/80   Pulse 71   Temp 98.3 F (36.8 C) (Oral)   Resp 18   SpO2 98%   Physical Exam Vitals signs and nursing note reviewed.  Constitutional:      General: He is not in acute distress.    Appearance: He is well-developed.     Comments: Tall chronically ill-appearing male in no acute discomfort.  HENT:     Head: Atraumatic.  Eyes:     Conjunctiva/sclera: Conjunctivae normal.  Neck:     Musculoskeletal: Neck supple.  Cardiovascular:     Rate and Rhythm: Normal rate and regular rhythm.     Heart sounds: Normal heart sounds.  Pulmonary:     Breath sounds: Rhonchi (Faint scattered rhonchi.) present.  Chest:     Comments: Evidence of pectus carinatum.  Mild tenderness to anterior chest wall on palpation without any crepitus or emphysema. Abdominal:      Palpations: Abdomen is soft.  Musculoskeletal:        General: Signs of injury (Left hand: Large ecchymosis noted throughout the dorsum of hand involving the thumb and the wrist with tenderness to palpation.) present.  Skin:    Findings: No rash.     Comments: Various bruising noted to patient's back,  chest, and extremities.  Neurological:     Mental Status: He is alert. Mental status is at baseline.  Psychiatric:        Mood and Affect: Mood normal.      ED Treatments / Results  Labs (all labs ordered are listed, but only abnormal results are displayed) Labs Reviewed  CBC - Abnormal; Notable for the following components:      Result Value   RBC 2.95 (*)    Hemoglobin 8.8 (*)    HCT 28.8 (*)    RDW 18.9 (*)    All other components within normal limits  SARS CORONAVIRUS 2 (TAT 6-24 HRS)  BASIC METABOLIC PANEL  TROPONIN I (HIGH SENSITIVITY)  TROPONIN I (HIGH SENSITIVITY)    EKG None  ED ECG REPORT   Date: 01/06/2019  Rate: 72  Rhythm: normal sinus rhythm  QRS Axis: normal  Intervals: QT prolonged  ST/T Wave abnormalities: normal  Conduction Disutrbances:none  Narrative Interpretation:   Old EKG Reviewed: unchanged  I have personally reviewed the EKG tracing and agree with the computerized printout as noted.   Radiology Dg Chest Port 1 View  Result Date: 01/06/2019 CLINICAL DATA:  Left-sided chest pain. EXAM: PORTABLE CHEST 1 VIEW COMPARISON:  November 05, 2018 FINDINGS: Cardiomediastinal silhouette is stable. Mediastinal contours appear intact. Subtle peribronchial airspace opacities in bilateral lower lung fields, may represent developing pulmonary edema or atypical consolidation. Osseous structures are without acute abnormality. Soft tissues are grossly normal. IMPRESSION: 1. Subtle peribronchial airspace opacities in bilateral lower lung fields, may represent developing pulmonary edema or atypical consolidation. Electronically Signed   By: Ted Mcalpineobrinka  Dimitrova M.D.    On: 01/06/2019 14:24   Dg Hand Complete Left  Result Date: 01/04/2019 CLINICAL DATA:  Left hand pain and swelling following a fall yesterday. EXAM: LEFT HAND - COMPLETE 3+ VIEW COMPARISON:  Right wrist dated 09/01/2017. FINDINGS: Comminuted fracture of the proximal shaft of the 5th metacarpal with impaction. Radial and ventral angulation of the proximal fragment. Associated diffuse dorsal soft tissue swelling. IMPRESSION: Comminuted 5th metacarpal fracture with impaction and angulation, as described above. Electronically Signed   By: Beckie SaltsSteven  Reid M.D.   On: 01/04/2019 18:34    Procedures Procedures (including critical care time)  Medications Ordered in ED Medications - No data to display   Initial Impression / Assessment and Plan / ED Course  I have reviewed the triage vital signs and the nursing notes.  Pertinent labs & imaging results that were available during my care of the patient were reviewed by me and considered in my medical decision making (see chart for details).        BP 134/81   Pulse 75   Temp 98.3 F (36.8 C) (Oral)   Resp 16   SpO2 100%    Final Clinical Impressions(s) / ED Diagnoses   Final diagnoses:  None    ED Discharge Orders    None     3:41 PM Patient here for pain to the left side of her chest that developed acutely approximately 2 hours ago.  Pain is atypical of ACS as it does not have any associated symptoms such as nausea lightheadedness dizziness diaphoresis or shortness of breath.  He denies having any fever chills or cough.  However he has been in and out of the ER for the past few days due to injury of his left hand resulting in a comminuted first metacarpal fracture that was diagnosed to 3 days ago.  Patient however refused  to wear a splint on his hand and was seen early this morning for pain related to his hand.  He was given pain medication in the ED  Today, chest x-ray obtained shows subtle peribronchiolar airspace opacity in bilateral  lower lungs fields, may represent developing pulmonary edema or atypical consolidation.  Given the patient does not have any infectious symptoms, due to recent ER visit, will obtain COVID-19 test.  Patient signed out to Terance Hart, PA-C who will follow-up on delta troponin.  If negative, patient can be discharged with plus or minus antibiotic for potential lung infection.  [name] was evaluated in Emergency Department on 01/06/2019 for the symptoms described in the history of present illness. He was evaluated in the context of the global COVID-19 pandemic, which necessitated consideration that the patient might be at risk for infection with the SARS-CoV-2 virus that causes COVID-19. Institutional protocols and algorithms that pertain to the evaluation of patients at risk for COVID-19 are in a state of rapid change based on information released by regulatory bodies including the CDC and federal and state organizations. These policies and algorithms were followed during the patient's care in the ED.     Fayrene Helper, PA-C 01/06/19 1544    Arby Barrette, MD 01/07/19 [phone]

## 2019-01-06 NOTE — ED Triage Notes (Signed)
[facility] report released from [facility] for his hand today. Pt reports left sided CP beginning around 1:30 p. Pt given 324 mg ASA. Pt noted to be asking for oxycodone x3 times en route. V/S stable

## 2019-01-06 NOTE — ED Notes (Signed)
Pt advised that he has a card and will go home by cab. Pt currently in the lobby, security is calling a cab for the pt.

## 2019-01-06 NOTE — ED Notes (Signed)
Pt has repeatedly called out asking for oxycodone.

## 2019-01-06 NOTE — ED Notes (Signed)
Discharge instructions reviewed with patient. Pain management discussed. Patient in NAD at time of discharge. Patient ambulatory with cane (baseline).

## 2019-01-06 NOTE — ED Notes (Signed)
Advised CN on the status of this pt.

## 2019-01-06 NOTE — ED Provider Notes (Signed)
[facility] EMERGENCY DEPARTMENT Provider Note   CSN: [id number] Arrival date & time: 01/06/19  1843     History   Chief Complaint Chief Complaint  Patient presents with   Follow-up    HPI [name] is a 58 y.o. male with a history of anemia, ascites, chronic pain, cirrhosis, hepatitis C, CKD, peripheral neuropathy, PTSD, schizoaffective disorder presents for evaluation "I just want someone to talk to ".  He tells me that on Saturdays and Sundays he feels his worst emotionally because "all of my friends are with their families and I have nobody ".  He denies suicidal ideation or homicidal ideation.  He tells me he has an appointment to see his psychiatrist on Tuesday in 3 days.  Also of note he was seen at [facility] emergency department this morning and at [facility] emergency department earlier today and was discharged just a couple of hours ago.  He is requesting social work/case management consult for assistance with home health. He is also requesting a ginger ale.       The history is provided by the patient.    Past Medical History:  Diagnosis Date   Anemia    Ascites    Chronic leg pain    Cirrhosis (HCC)    GERD (gastroesophageal reflux disease)    H/O diabetes insipidus    Hepatitis C    Completed therapy with Epclusa ~ 2018.  suspect therapy overseen by [facility] in [place], [place]   History of adrenal insufficiency    History of ARDS    History of blood transfusion    Hypothyroidism    Kidney disease    stage 3   Neuropathy    Overdose 12/2009   Peripheral neuropathy    Personality disorder (HCC)    PTSD (post-traumatic stress disorder)    SECONDARY TO WAR IN [place]   Schizoaffective disorder [facility])     Patient Active Problem List   Diagnosis Date Noted   GI bleed 08/07/2018   Acute abdominal pain    Cirrhosis of liver with ascites (HCC)    Generalized abdominal pain    Hematemesis with nausea     Evaluation by psychiatric service required    Acute upper gastrointestinal bleeding 10/15/2017   PTSD (post-traumatic stress disorder)    Hypothyroidism    History of adrenal insufficiency    H/O diabetes insipidus    Schizoaffective disorder (HCC) 11/08/2011   Personality disorder (HCC)    Anemia    Ascites    Liver disease    Hepatitis C    Kidney disease    GERD (gastroesophageal reflux disease)    Psychosis (HCC) 11/07/2011    Past Surgical History:  Procedure Laterality Date   CERVICAL SPINE SURGERY  unknown   RIGHT HIP SURGERY  1996   US GUIDED LEFT THORACENTESIS  01/21/2010        Home Medications    Prior to Admission medications   Medication Sig Start Date End Date Taking? Authorizing Provider  [employer] D3 2000 units CAPS Take 2,000 Units by mouth daily. 10/23/17   [provider]  diphenhydrAMINE (BENADRYL) 25 MG tablet Take 25 mg by mouth at bedtime.     [provider]  fluPHENAZine (PROLIXIN) 5 MG tablet Take 5 mg by mouth 3 (three) times daily.    [provider]  gabapentin (NEURONTIN) 100 MG capsule Take 200 mg by mouth 3 (three) times daily. 07/21/18   [provider]  LORazepam (ATIVAN) 0.5 MG tablet Take 0.5 mg by mouth at bedtime.     [provider]  meloxicam (MOBIC) 15 MG tablet Take 15 mg by mouth daily. 07/06/18   [provider]  Oxycodone HCl 10 MG TABS Take 10 mg by mouth every 8 (eight) hours. 10/18/18   [provider]  predniSONE (DELTASONE) 50 MG tablet Take 1 tablet (50 mg total) by mouth daily. 11/26/18   Lawyer, Christopher, PA-C  SENSIPAR 30 MG tablet Take 30 mg by mouth daily. 07/25/17   [provider]  trihexyphenidyl (ARTANE) 2 MG tablet Take 2 mg by mouth 3 (three) times daily.    [provider]  colchicine 0.6 MG tablet Take 1 tablet (0.6 mg total) by mouth daily. Patient not taking: Reported on 11/05/2018 08/26/18 12/31/18  Tanda Rockers, PA-C    pantoprazole (PROTONIX) 40 MG tablet Take 1 tablet (40 mg total) by mouth 2 (two) times daily. Patient not taking: Reported on 10/01/2018 10/18/17 12/31/18  Simonne Martinet, NP    Family History Family History  Problem Relation Age of Onset   Cancer Mother    Heart attack Father     Social History Social History   Tobacco Use   Smoking status: Current Every Day Smoker    Packs/day: 1.00    Years: 25.00    Pack years: 25.00    Types: Cigarettes   Smokeless tobacco: Never Used  Substance Use Topics   Alcohol use: No    Comment: Pt denies    Drug use: No    Comment: Pt denies     Allergies   Patient has no known allergies.   Review of Systems Review of Systems  Constitutional: Negative for chills and fever.  Psychiatric/Behavioral: Positive for dysphoric mood. Negative for suicidal ideas.  All other systems reviewed and are negative.    Physical Exam Updated Vital Signs BP (!) 147/96    Pulse 78    Temp 98.5 F (36.9 C) (Oral)    Resp 15    SpO2 100%   Physical Exam Vitals signs and nursing note reviewed.  Constitutional:      General: He is not in acute distress.    Appearance: He is well-developed.  HENT:     Head: Normocephalic and atraumatic.  Eyes:     General:        Right eye: No discharge.        Left eye: No discharge.     Conjunctiva/sclera: Conjunctivae normal.  Neck:     Vascular: No JVD.     Trachea: No tracheal deviation.  Cardiovascular:     Rate and Rhythm: Normal rate.  Pulmonary:     Effort: Pulmonary effort is normal.     Comments: Speaking in full sentences without difficulty, SPO2 saturations 100% on room air Abdominal:     General: There is no distension.  Musculoskeletal:     Comments: Swelling and ecchymosis to the left hand due to known fractures.  Patient refused re-splinting per documentation from providers earlier today.  Skin:    Findings: No erythema.  Neurological:     Mental Status: He is alert.     Comments:  Fluent speech, no facial droop.  Moves all extremities spontaneously without difficulty.  Ambulatory with steady gait and balance with the aid of a cane  Psychiatric:        Behavior: Behavior normal.      ED Treatments / Results  Labs (all  labs ordered are listed, but only abnormal results are displayed) Labs Reviewed - No data to display  EKG None  Radiology Dg Chest [facility] 1 View  Result Date: 01/06/2019 CLINICAL DATA:  Left-sided chest pain. EXAM: PORTABLE CHEST 1 VIEW COMPARISON:  November 05, 2018 FINDINGS: Cardiomediastinal silhouette is stable. Mediastinal contours appear intact. Subtle peribronchial airspace opacities in bilateral lower lung fields, may represent developing pulmonary edema or atypical consolidation. Osseous structures are without acute abnormality. Soft tissues are grossly normal. IMPRESSION: 1. Subtle peribronchial airspace opacities in bilateral lower lung fields, may represent developing pulmonary edema or atypical consolidation. Electronically Signed   By: Fidela Salisbury M.D.   On: 01/06/2019 14:24    Procedures Procedures (including critical care time)  Medications Ordered in ED Medications - No data to display   Initial Impression / Assessment and Plan / ED Course  I have reviewed the triage vital signs and the nursing notes.  Pertinent labs & imaging results that were available during my care of the patient were reviewed by me and considered in my medical decision making (see chart for details).        Patient presenting for evaluation of feeling sad and wanting someone to talk to.  He is afebrile, mildly hypertensive in the ED but vital signs overall stable and he is generally well-appearing.  He is well-known to this ED with 21 visits in the last 6 months and this is actually his third visit to the ED today.  He was seen at [facility] for hand pain was noted to have run out of his home oxycodone and was given a dose in the ED there.  He also  presented to [facility] a few hours ago for evaluation of chest pain which was negative for ACS/MI.  He has a COVID test pending but reportedly no infectious symptoms.  He did receive a tablet of oxycodone at that ED visit as well.  Per review of [place] controlled substance registry the patient is out of his home narcotics approximately 10 days early.  On my assessment when asked what prompted him to return to the ED third time today he states "I am very sad that all of my friends can spend time with their families on the weekends but I am alone ".  He vehemently denies suicidal ideation or homicidal ideation.  He tells me that he is "invalid "and "disabled "and that he does not have enough help at home.  I will order case management and social work consultation for assistance with home health.  He was given a taxi voucher home.  Discussed strict ED return precautions.  Recommend follow-up with PCP for evaluation of chronic medical problems. Patient verbalized understanding of and agreement with plan and is safe for discharge home at this time.   Final Clinical Impressions(s) / ED Diagnoses   Final diagnoses:  Feeling sad    ED Discharge Orders         [place]     01/06/19 1939    Face-to-face encounter (required for Medicare/Medicaid patients)    Comments: [place] certify that this patient is under my care and that I, or a nurse practitioner or physician's assistant working with me, had a face-to-face encounter that meets the physician face-to-face encounter requirements with this patient on 01/06/2019. The encounter with the patient was in whole, or in part for the following medical condition(s) which is the primary reason for home  health care (List medical condition): peripheral neuropathy   01/06/19 1939           Bennye AlmFawze, Markita Stcharles A, PA-C 01/06/19 2231    Benjiman CorePickering, Nathan, MD 01/06/19 2329

## 2019-01-06 NOTE — ED Notes (Signed)
Patient verbalizes understanding of discharge instructions. Opportunity for questioning and answers were provided. Armband removed by staff, pt discharged from ED.  

## 2019-01-06 NOTE — ED Triage Notes (Signed)
Pt discharged from this ED tonight, pt continually stating "I don't feel good". C/o chronic pain.

## 2019-01-06 NOTE — ED Notes (Signed)
The number in the chart is not in service. Pt called two numbers that aren't in service.

## 2019-01-06 NOTE — ED Notes (Addendum)
Pt. Given [place]

## 2019-01-06 NOTE — ED Notes (Signed)
Pt ambulated independently with cane

## 2019-01-06 NOTE — ED Provider Notes (Signed)
58 year old male presents with chest pain. Atypical in nature. Chest pain work up is reassuring. Delta trop is pending at d/c. CXR shows possible atypical infection although patient has no fever, cough, leukocytosis, hypoxia. COVID test was obtained.  I was called to patient's bedside due to him frequently using the call bell to get food, pain meds. He is out of his chronic pain medicine and will follow up on Tuesday. He is requesting a dose of his home meds. Delta trop is normal. Will d/c.   Recardo Evangelist, PA-C 01/06/19 1800    Davonna Belling, MD 01/06/19 2329

## 2019-01-06 NOTE — Discharge Instructions (Addendum)
Continue to take your home medications as prescribed.  I have reached out to our Education officer, museum and case manager to set up home health.  Follow-up with your psychiatrist on Tuesday as scheduled.  Return to the emergency department if any concerning signs or symptoms develop.

## 2019-01-06 NOTE — Discharge Instructions (Signed)
You will need to follow-up with your primary care doctor or PCP for continued pain management.  You were offered splinting of the right hand but declined this due to issues with walking and cane use, follow-up with orthopedics as planned.

## 2019-01-07 ENCOUNTER — Encounter (HOSPITAL_COMMUNITY): Payer: Self-pay | Admitting: Emergency Medicine

## 2019-01-07 ENCOUNTER — Encounter (HOSPITAL_COMMUNITY): Payer: Self-pay | Admitting: *Deleted

## 2019-01-07 ENCOUNTER — Emergency Department (HOSPITAL_COMMUNITY)
Admission: EM | Admit: 2019-01-07 | Discharge: 2019-01-07 | Disposition: A | Discharge status: Home / Self Care | Payer: Medicaid Other | Source: Home / Self Care | Attending: Emergency Medicine | Admitting: Emergency Medicine

## 2019-01-07 ENCOUNTER — Inpatient Hospital Stay (HOSPITAL_COMMUNITY)
Admission: EM | Admit: 2019-01-07 | Discharge: 2019-01-11 | DRG: 377 | Disposition: A | Discharge status: Home / Self Care | Payer: Medicaid Other | Attending: Internal Medicine | Admitting: Internal Medicine

## 2019-01-07 ENCOUNTER — Other Ambulatory Visit: Payer: Self-pay

## 2019-01-07 DIAGNOSIS — D62 Acute posthemorrhagic anemia: Secondary | ICD-10-CM | POA: Diagnosis present

## 2019-01-07 DIAGNOSIS — Z791 Long term (current) use of non-steroidal anti-inflammatories (NSAID): Secondary | ICD-10-CM

## 2019-01-07 DIAGNOSIS — F17210 Nicotine dependence, cigarettes, uncomplicated: Secondary | ICD-10-CM | POA: Diagnosis present

## 2019-01-07 DIAGNOSIS — F4310 Post-traumatic stress disorder, unspecified: Secondary | ICD-10-CM | POA: Diagnosis present

## 2019-01-07 DIAGNOSIS — F609 Personality disorder, unspecified: Secondary | ICD-10-CM | POA: Diagnosis present

## 2019-01-07 DIAGNOSIS — W228XXA Striking against or struck by other objects, initial encounter: Secondary | ICD-10-CM | POA: Diagnosis present

## 2019-01-07 DIAGNOSIS — E039 Hypothyroidism, unspecified: Secondary | ICD-10-CM | POA: Diagnosis present

## 2019-01-07 DIAGNOSIS — G894 Chronic pain syndrome: Secondary | ICD-10-CM | POA: Diagnosis present

## 2019-01-07 DIAGNOSIS — R9431 Abnormal electrocardiogram [ECG] [EKG]: Secondary | ICD-10-CM | POA: Diagnosis present

## 2019-01-07 DIAGNOSIS — K920 Hematemesis: Secondary | ICD-10-CM | POA: Diagnosis present

## 2019-01-07 DIAGNOSIS — Z8619 Personal history of other infectious and parasitic diseases: Secondary | ICD-10-CM

## 2019-01-07 DIAGNOSIS — K7460 Unspecified cirrhosis of liver: Secondary | ICD-10-CM | POA: Diagnosis present

## 2019-01-07 DIAGNOSIS — F251 Schizoaffective disorder, depressive type: Secondary | ICD-10-CM | POA: Diagnosis present

## 2019-01-07 DIAGNOSIS — I8510 Secondary esophageal varices without bleeding: Secondary | ICD-10-CM | POA: Diagnosis present

## 2019-01-07 DIAGNOSIS — E2740 Unspecified adrenocortical insufficiency: Secondary | ICD-10-CM | POA: Diagnosis present

## 2019-01-07 DIAGNOSIS — K449 Diaphragmatic hernia without obstruction or gangrene: Secondary | ICD-10-CM | POA: Diagnosis present

## 2019-01-07 DIAGNOSIS — D649 Anemia, unspecified: Secondary | ICD-10-CM | POA: Diagnosis present

## 2019-01-07 DIAGNOSIS — N183 Chronic kidney disease, stage 3 unspecified: Secondary | ICD-10-CM | POA: Diagnosis present

## 2019-01-07 DIAGNOSIS — N1832 Chronic kidney disease, stage 3b: Secondary | ICD-10-CM | POA: Diagnosis present

## 2019-01-07 DIAGNOSIS — Z79899 Other long term (current) drug therapy: Secondary | ICD-10-CM

## 2019-01-07 DIAGNOSIS — K254 Chronic or unspecified gastric ulcer with hemorrhage: Principal | ICD-10-CM | POA: Diagnosis present

## 2019-01-07 DIAGNOSIS — I1310 Hypertensive heart and chronic kidney disease without heart failure, with stage 1 through stage 4 chronic kidney disease, or unspecified chronic kidney disease: Secondary | ICD-10-CM | POA: Diagnosis present

## 2019-01-07 DIAGNOSIS — K253 Acute gastric ulcer without hemorrhage or perforation: Secondary | ICD-10-CM

## 2019-01-07 DIAGNOSIS — K269 Duodenal ulcer, unspecified as acute or chronic, without hemorrhage or perforation: Secondary | ICD-10-CM

## 2019-01-07 DIAGNOSIS — K219 Gastro-esophageal reflux disease without esophagitis: Secondary | ICD-10-CM | POA: Diagnosis present

## 2019-01-07 DIAGNOSIS — K8020 Calculus of gallbladder without cholecystitis without obstruction: Secondary | ICD-10-CM | POA: Diagnosis present

## 2019-01-07 DIAGNOSIS — K2100 Gastro-esophageal reflux disease with esophagitis, without bleeding: Secondary | ICD-10-CM | POA: Diagnosis present

## 2019-01-07 DIAGNOSIS — D696 Thrombocytopenia, unspecified: Secondary | ICD-10-CM | POA: Diagnosis present

## 2019-01-07 DIAGNOSIS — K264 Chronic or unspecified duodenal ulcer with hemorrhage: Secondary | ICD-10-CM | POA: Diagnosis present

## 2019-01-07 DIAGNOSIS — G629 Polyneuropathy, unspecified: Secondary | ICD-10-CM | POA: Diagnosis present

## 2019-01-07 DIAGNOSIS — W19XXXA Unspecified fall, initial encounter: Secondary | ICD-10-CM | POA: Diagnosis present

## 2019-01-07 DIAGNOSIS — Z0000 Encounter for general adult medical examination without abnormal findings: Secondary | ICD-10-CM | POA: Insufficient documentation

## 2019-01-07 DIAGNOSIS — R45851 Suicidal ideations: Secondary | ICD-10-CM

## 2019-01-07 DIAGNOSIS — K7200 Acute and subacute hepatic failure without coma: Secondary | ICD-10-CM | POA: Diagnosis present

## 2019-01-07 DIAGNOSIS — S62337A Displaced fracture of neck of fifth metacarpal bone, left hand, initial encounter for closed fracture: Secondary | ICD-10-CM | POA: Diagnosis present

## 2019-01-07 DIAGNOSIS — K209 Esophagitis, unspecified without bleeding: Secondary | ICD-10-CM

## 2019-01-07 DIAGNOSIS — Z79891 Long term (current) use of opiate analgesic: Secondary | ICD-10-CM

## 2019-01-07 DIAGNOSIS — D689 Coagulation defect, unspecified: Secondary | ICD-10-CM | POA: Diagnosis present

## 2019-01-07 DIAGNOSIS — Z8249 Family history of ischemic heart disease and other diseases of the circulatory system: Secondary | ICD-10-CM

## 2019-01-07 DIAGNOSIS — E232 Diabetes insipidus: Secondary | ICD-10-CM | POA: Diagnosis present

## 2019-01-07 DIAGNOSIS — N179 Acute kidney failure, unspecified: Secondary | ICD-10-CM | POA: Diagnosis present

## 2019-01-07 DIAGNOSIS — Z20828 Contact with and (suspected) exposure to other viral communicable diseases: Secondary | ICD-10-CM | POA: Diagnosis present

## 2019-01-07 DIAGNOSIS — Z139 Encounter for screening, unspecified: Secondary | ICD-10-CM

## 2019-01-07 DIAGNOSIS — Z7952 Long term (current) use of systemic steroids: Secondary | ICD-10-CM

## 2019-01-07 DIAGNOSIS — M109 Gout, unspecified: Secondary | ICD-10-CM | POA: Diagnosis present

## 2019-01-07 MED ORDER — DIPHENHYDRAMINE HCL 25 MG PO CAPS
25.0000 mg | ORAL_CAPSULE | Freq: Every day | ORAL | Status: DC
  Administered 2019-01-07: 22:00:00 25 mg via ORAL
  Filled 2019-01-07: qty 1

## 2019-01-07 MED ORDER — OXYCODONE HCL 5 MG PO TABS
10.0000 mg | ORAL_TABLET | Freq: Once | ORAL | Status: AC
  Administered 2019-01-07: 14:00:00 10 mg via ORAL
  Filled 2019-01-07: qty 2

## 2019-01-07 MED ORDER — HALOPERIDOL LACTATE 5 MG/ML IJ SOLN
5.0000 mg | Freq: Once | INTRAMUSCULAR | Status: AC
  Administered 2019-01-07: 5 mg via INTRAMUSCULAR
  Filled 2019-01-07: qty 1

## 2019-01-07 MED ORDER — DIPHENHYDRAMINE HCL 25 MG PO CAPS
50.0000 mg | ORAL_CAPSULE | Freq: Once | ORAL | Status: AC
  Administered 2019-01-07: 50 mg via ORAL
  Filled 2019-01-07: qty 2

## 2019-01-07 MED ORDER — LORAZEPAM 1 MG PO TABS
0.5000 mg | ORAL_TABLET | Freq: Every day | ORAL | Status: DC
  Administered 2019-01-07: 0.5 mg via ORAL
  Filled 2019-01-07: qty 1

## 2019-01-07 MED ORDER — LORAZEPAM 1 MG PO TABS
1.0000 mg | ORAL_TABLET | Freq: Once | ORAL | Status: AC
  Administered 2019-01-07: 1 mg via ORAL
  Filled 2019-01-07: qty 1

## 2019-01-07 MED ORDER — IBUPROFEN 400 MG PO TABS
400.0000 mg | ORAL_TABLET | Freq: Three times a day (TID) | ORAL | Status: DC | PRN
  Administered 2019-01-07: 400 mg via ORAL
  Filled 2019-01-07: qty 1

## 2019-01-07 MED ORDER — OXYCODONE HCL 5 MG PO TABS
10.0000 mg | ORAL_TABLET | Freq: Three times a day (TID) | ORAL | Status: DC | PRN
  Administered 2019-01-07 – 2019-01-08 (×2): 10 mg via ORAL
  Filled 2019-01-07 (×2): qty 2

## 2019-01-07 NOTE — ED Provider Notes (Signed)
[facility] DEPT Provider Note   CSN: [id number] Arrival date & time: 01/07/19  0423     History   Chief Complaint Chief Complaint  Patient presents with  . Psychiatric Evaluation    HPI Keneth Borg is a 58 y.o. male.     Patient checks back into the ED immediately upon being discharged.  Now stating that he wants to speak with psychiatry because he has PTSD.  Denies SI or HI.  Still complaining of chronic pain.  No other new symptoms.  The history is provided by the patient. No language interpreter was used.    Past Medical History:  Diagnosis Date  . Anemia   . Ascites   . Chronic leg pain   . Cirrhosis ([place])   . GERD (gastroesophageal reflux disease)   . H/O diabetes insipidus   . Hepatitis C    Completed therapy with Epclusa ~ 2018.  suspect therapy overseen by [facility] clinic in [place], [place]  . History of adrenal insufficiency   . History of ARDS   . History of blood transfusion   . Hypothyroidism   . Kidney disease    stage 3  . Neuropathy   . Overdose 12/2009  . Peripheral neuropathy   . Personality disorder ([place])   . PTSD (post-traumatic stress disorder)    SECONDARY TO WAR IN [place]  . Schizoaffective disorder [facility])     Patient Active Problem List   Diagnosis Date Noted  . GI bleed 08/07/2018  . Acute abdominal pain   . Cirrhosis of liver with ascites ([place])   . Generalized abdominal pain   . Hematemesis with nausea   . Evaluation by psychiatric service required   . Acute upper gastrointestinal bleeding 10/15/2017  . PTSD (post-traumatic stress disorder)   . Hypothyroidism   . History of adrenal insufficiency   . H/O diabetes insipidus   . Schizoaffective disorder ([place]) 11/08/2011  . Personality disorder ([place])   . Anemia   . Ascites   . Liver disease   . Hepatitis C   . Kidney disease   . GERD (gastroesophageal reflux disease)   . Psychosis ([place]) 11/07/2011    Past Surgical History:   Procedure Laterality Date  . CERVICAL SPINE SURGERY  unknown  . RIGHT HIP SURGERY  1996  . US GUIDED LEFT THORACENTESIS  01/21/2010        Home Medications    Prior to Admission medications   Medication Sig Start Date End Date Taking? Authorizing Provider  [employer]S D3 2000 units CAPS Take 2,000 Units by mouth daily. 10/23/17   [provider]  diphenhydrAMINE (BENADRYL) 25 MG tablet Take 25 mg by mouth at bedtime.     [provider]  fluPHENAZine (PROLIXIN) 5 MG tablet Take 5 mg by mouth 3 (three) times daily.    [provider]  gabapentin (NEURONTIN) 100 MG capsule Take 200 mg by mouth 3 (three) times daily. 07/21/18   [provider]  LORazepam (ATIVAN) 0.5 MG tablet Take 0.5 mg by mouth at bedtime.     [provider]  meloxicam (MOBIC) 15 MG tablet Take 15 mg by mouth daily. 07/06/18   [provider]  Oxycodone HCl 10 MG TABS Take 10 mg by mouth every 8 (eight) hours. 10/18/18   [provider]  predniSONE (DELTASONE) 50 MG tablet Take 1 tablet (50 mg total) by mouth daily. 11/26/18   Lawyer, Christopher, PA-C  SENSIPAR 30 MG tablet Take 30  mg by mouth daily. 07/25/17   [provider]  trihexyphenidyl (ARTANE) 2 MG tablet Take 2 mg by mouth 3 (three) times daily.    [provider]  colchicine 0.6 MG tablet Take 1 tablet (0.6 mg total) by mouth daily. Patient not taking: Reported on 11/05/2018 08/26/18 12/31/18  Tanda Rockers, PA-C  pantoprazole (PROTONIX) 40 MG tablet Take 1 tablet (40 mg total) by mouth 2 (two) times daily. Patient not taking: Reported on 10/01/2018 10/18/17 12/31/18  Simonne Martinet, NP    Family History Family History  Problem Relation Age of Onset  . Cancer Mother   . Heart attack Father     Social History Social History   Tobacco Use  . Smoking status: Current Every Day Smoker    Packs/day: 1.00    Years: 25.00    Pack years: 25.00    Types: Cigarettes  . Smokeless tobacco:  Never Used  Substance Use Topics  . Alcohol use: No    Comment: Pt denies   . Drug use: No    Comment: Pt denies     Allergies   Patient has no known allergies.   Review of Systems Review of Systems  All other systems reviewed and are negative.    Physical Exam Updated Vital Signs BP 136/65 (BP Location: Left Arm)   Pulse 79   Temp 98 F (36.7 C) (Oral)   Resp 17   SpO2 100%   Physical Exam Vitals signs and nursing note reviewed.  Constitutional:      General: He is not in acute distress.    Appearance: He is well-developed. He is not ill-appearing.  HENT:     Head: Normocephalic and atraumatic.  Eyes:     Conjunctiva/sclera: Conjunctivae normal.  Neck:     Musculoskeletal: Neck supple.  Cardiovascular:     Rate and Rhythm: Normal rate.  Pulmonary:     Effort: Pulmonary effort is normal. No respiratory distress.  Abdominal:     General: There is no distension.  Musculoskeletal:     Comments: Moves all extremities  Skin:    General: Skin is warm and dry.  Neurological:     Mental Status: He is alert and oriented to person, place, and time.  Psychiatric:        Mood and Affect: Mood normal.        Behavior: Behavior normal.      ED Treatments / Results  Labs (all labs ordered are listed, but only abnormal results are displayed) Labs Reviewed - No data to display  EKG None  Radiology Dg Chest [place] 1 View  Result Date: 01/06/2019 CLINICAL DATA:  Left-sided chest pain. EXAM: PORTABLE CHEST 1 VIEW COMPARISON:  November 05, 2018 FINDINGS: Cardiomediastinal silhouette is stable. Mediastinal contours appear intact. Subtle peribronchial airspace opacities in bilateral lower lung fields, may represent developing pulmonary edema or atypical consolidation. Osseous structures are without acute abnormality. Soft tissues are grossly normal. IMPRESSION: 1. Subtle peribronchial airspace opacities in bilateral lower lung fields, may represent developing pulmonary edema  or atypical consolidation. Electronically Signed   By: Ted Mcalpine M.D.   On: 01/06/2019 14:24    Procedures Procedures (including critical care time)  Medications Ordered in ED Medications - No data to display   Initial Impression / Assessment and Plan / ED Course  I have reviewed the triage vital signs and the nursing notes.  Pertinent labs & imaging results that were available during my care of the patient were reviewed  by me and considered in my medical decision making (see chart for details).       Patient checks back in asking to speak with psychiatry about PTSD.  No SI or HI.  Still complaining of chronic pain.  Give resources.  Given patient's extensive hx and numerous workups, don't believe that any additional workup indicated tonight.  Final Clinical Impressions(s) / ED Diagnoses   Final diagnoses:  Encounter for medical screening examination    ED Discharge Orders    None       Roxy Horseman, PA-C 01/07/19 0447    Nira Conn, MD 01/10/19 2108

## 2019-01-07 NOTE — ED Provider Notes (Signed)
[name]-EMERGENCY DEPT Provider Note   CSN: [id number] Arrival date & time: 01/07/19  0256     History   Chief Complaint Chief Complaint  Patient presents with  . Pain    HPI [name] is a 58 y.o. male.     Patient presents to the emergency department with a chief complaint of chronic pain.  He states that he has chronic pain, and is out of his pain medications.  He does not get his pain medications refilled until Tuesday (2 days from now).  He states that he does not have anything else that he can take to alleviate his symptoms.  He denies any new symptoms.  Denies any other associated symptoms.  The history is provided by the patient. No language interpreter was used.    Past Medical History:  Diagnosis Date  . Anemia   . Ascites   . Chronic leg pain   . Cirrhosis (HCC)   . GERD (gastroesophageal reflux disease)   . H/O diabetes insipidus   . Hepatitis C    Completed therapy with Epclusa ~ 2018.  suspect therapy over[facility] [place]  . History of adrenal insufficiency   . History of ARDS   . History of blood transfusion   . Hypothyroidism   . Kidney disease    stage 3  . Neuropathy   . Overdose 12/2009  . Peripheral neuropathy   . Personality disorder (HCC)   . PTSD (post-traumatic stress disorder)    [place]  . Schizoaffective disorder [facility](HCC)     Patient Active Problem List   Diagnosis Date Noted  . GI bleed 08/07/2018  . Acute abdominal pain   . Cirrhosis of liver with ascites (HCC)   . Generalized abdominal pain   . Hematemesis with nausea   . Evaluation by psychiatric service required   . Acute upper gastrointestinal bleeding 10/15/2017  . PTSD (post-traumatic stress disorder)   . Hypothyroidism   . History of adrenal insufficiency   . H/O diabetes insipidus   . Schizoaffective disorder (HCC) 11/08/2011  . Personality disorder (HCC)   . Anemia   . Ascites   . Liver  disease   . Hepatitis C   . Kidney disease   . GERD (gastroesophageal reflux disease)   . Psychosis (HCC) 11/07/2011    Past Surgical History:  Procedure Laterality Date  . CERVICAL SPINE SURGERY  unknown  . RIGHT HIP SURGERY  1996  . US GUIDED LEFT THORACENTESIS  01/21/2010        Home Medications    Prior to Admission medications   Medication Sig Start Date End Date Taking? Authorizing Provider  [facility] D3 2000 units CAPS Take 2,000 Units by mouth daily. 10/23/17   [provider]  diphenhydrAMINE (BENADRYL) 25 MG tablet Take 25 mg by mouth at bedtime.     [provider]  fluPHENAZine (PROLIXIN) 5 MG tablet Take 5 mg by mouth 3 (three) times daily.    [provider]  gabapentin (NEURONTIN) 100 MG capsule Take 200 mg by mouth 3 (three) times daily. 07/21/18   [provider]  LORazepam (ATIVAN) 0.5 MG tablet Take 0.5 mg by mouth at bedtime.     [provider]  meloxicam (MOBIC) 15 MG tablet Take 15 mg by mouth daily. 07/06/18   [provider]  Oxycodone HCl 10 MG TABS Take 10 mg by mouth every 8 (eight) hours. 10/18/18  [provider]  predniSONE (DELTASONE) 50 MG tablet Take 1 tablet (50 mg total) by mouth daily. 11/26/18   Lawyer, Christopher, PA-C  SENSIPAR 30 MG tablet Take 30 mg by mouth daily. 07/25/17   [provider]  trihexyphenidyl (ARTANE) 2 MG tablet Take 2 mg by mouth 3 (three) times daily.    [provider]  colchicine 0.6 MG tablet Take 1 tablet (0.6 mg total) by mouth daily. Patient not taking: Reported on 11/05/2018 08/26/18 12/31/18  Eustaquio Maize, PA-C  pantoprazole (PROTONIX) 40 MG tablet Take 1 tablet (40 mg total) by mouth 2 (two) times daily. Patient not taking: Reported on 10/01/2018 10/18/17 12/31/18  Erick Colace, NP    Family History Family History  Problem Relation Age of Onset  . Cancer Mother   . Heart attack Father     Social History Social History   Tobacco Use   . Smoking status: Current Every Day Smoker    Packs/day: 1.00    Years: 25.00    Pack years: 25.00    Types: Cigarettes  . Smokeless tobacco: Never Used  Substance Use Topics  . Alcohol use: No    Comment: Pt denies   . Drug use: No    Comment: Pt denies     Allergies   Patient has no known allergies.   Review of Systems Review of Systems  All other systems reviewed and are negative.    Physical Exam Updated Vital Signs BP 136/82 (BP Location: Right Arm)   Pulse 81   Temp 97.9 F (36.6 C) (Oral)   Resp 16   SpO2 100%   Physical Exam Vitals signs and nursing note reviewed.  Constitutional:      General: He is not in acute distress.    Appearance: He is well-developed. He is not ill-appearing.  HENT:     Head: Normocephalic and atraumatic.  Eyes:     Conjunctiva/sclera: Conjunctivae normal.  Neck:     Musculoskeletal: Neck supple.  Cardiovascular:     Rate and Rhythm: Normal rate.     Comments: Intact distal pulses, brisk capillary refill Pulmonary:     Effort: Pulmonary effort is normal. No respiratory distress.  Abdominal:     General: There is no distension.  Musculoskeletal:     Comments: Ecchymosis and swelling about the left hand, he is able to make a fist  Skin:    General: Skin is warm and dry.  Neurological:     Mental Status: He is alert and oriented to person, place, and time.  Psychiatric:        Mood and Affect: Mood normal.        Behavior: Behavior normal.      ED Treatments / Results  Labs (all labs ordered are listed, but only abnormal results are displayed) Labs Reviewed - No data to display  EKG None  Radiology Dg Chest Port 1 View  Result Date: 01/06/2019 CLINICAL DATA:  Left-sided chest pain. EXAM: PORTABLE CHEST 1 VIEW COMPARISON:  November 05, 2018 FINDINGS: Cardiomediastinal silhouette is stable. Mediastinal contours appear intact. Subtle peribronchial airspace opacities in bilateral lower lung fields, may represent  developing pulmonary edema or atypical consolidation. Osseous structures are without acute abnormality. Soft tissues are grossly normal. IMPRESSION: 1. Subtle peribronchial airspace opacities in bilateral lower lung fields, may represent developing pulmonary edema or atypical consolidation. Electronically Signed   By: Fidela Salisbury M.D.   On: 01/06/2019 14:24    Procedures Procedures (including critical care  time)  Medications Ordered in ED Medications - No data to display   Initial Impression / Assessment and Plan / ED Course  I have reviewed the triage vital signs and the nursing notes.  Pertinent labs & imaging results that were available during my care of the patient were reviewed by me and considered in my medical decision making (see chart for details).        Patient with chronic pain syndrome.  Seen numerous times for the same.  Has known fractures in left hand, but refuses further care for this including refusing splinting.  Platelet count is 104 has of 2 days ago.  He is chronically thrombocytopenic at about this level.  No new symptoms tonight.  Vital signs are stable.  Discussed department policy regarding refilling chronic pain medications.  Have encouraged patient to follow-up with his pain management doctor.  Final Clinical Impressions(s) / ED Diagnoses   Final diagnoses:  Chronic pain syndrome    ED Discharge Orders    None       Roxy Horseman, PA-C 01/07/19 0402    Nira Conn, MD 01/10/19 2107

## 2019-01-07 NOTE — ED Notes (Signed)
Unable to obtain discharge signature due to being in a hall bed, no computer or signature pad.

## 2019-01-07 NOTE — ED Triage Notes (Signed)
TTS done 

## 2019-01-07 NOTE — ED Notes (Signed)
Meal and snack given to pt.

## 2019-01-07 NOTE — ED Triage Notes (Signed)
Pt had been to multiple Emergency Departments over the past 36 hours. Pt just recently discharged from [facility]. Pt brought by [facility] for suicidal and does not want to live. Pt denies any means of doing so. Pt reports he has nobody left in his life because all of his relatives have passed away. Pt feels helpless at this point and keeps repeating "I want kill myself" over and over.

## 2019-01-07 NOTE — ED Triage Notes (Signed)
Pt arrives via EMS with chronic pain "all over" pt was just seen last night at [facility]. 160/92, 81, 18, 95% RA

## 2019-01-07 NOTE — ED Triage Notes (Signed)
Pt just d/c from [facility], went out to lobby and returns, reporting he has "lots of psychiatric problems like PTSD" wants to talk to someone, wants to "go to the psychiatry unit" He denies SI or HI. Says his medications are not working anymore. Also wants pain medication.

## 2019-01-07 NOTE — ED Notes (Signed)
Pt finally resting will return with night meds

## 2019-01-07 NOTE — ED Triage Notes (Signed)
PT anxious ,crying and inconsolable .

## 2019-01-07 NOTE — BH Assessment (Signed)
Tele Assessment Note   Patient Name: [name] MRN: [id number] Referring Physician: Windell Moment, PA-C Location of Patient: [facility] Location of Provider: [place] Department  Radek Carnero is a 58 y.o. male who presented to [facility] on voluntary basis with complaint of physical pain and then suicidal ideation and other depressive symptoms.  Per history, Pt has a standing diagnosis of Schizoaffective Disorder and PTSD (due to presence during the war in [place]).  Pt stated that he lives alone in [place] and that he is unemployed.  Pt indicated that he used to have a psychiatrist, but that he has not seen the psychiatrist in ''a long time.''  Per history, Pt has presented several times over the last 36 hours with complaint of physical pain, and he requested pain medication.  As he was being discharged in his recent visit, Pt asked to speak with psychiatry.  During most recent presentation, Pt complained of suicidal ideation for the last two days.  Pt also reported past suicide attempts, but he was not specific to dates or circumstances (''I don't know... it was a different time.'').  In addition to suicidal ideation (currently without plan or intent), Pt endorsed persistent despondency over the last two weeks, auditory hallucination (voices urging him to harm himself), insomnia (40 minutes of sleep over the last 24 hours), isolation, feelings of hopelessness and worthlessness.  Pt denied homicidal ideation, self-injurious behavior, and substance use.  Pt reported that he cannot recall when he was last inpatient.  He endorsed PTSD due to his presence during the [place] War.  Pt stated that he is followed by a psychiatrist, but he cannot recall when he last saw the doctor.  During assessment, Pt presented as alert and oriented.  He had good eye contact and was cooperative.  Demeanor was calm.  Pt's mood was reported as sad and preoccupied.  Affect was mood-congruent.  Speech was normal in rate, rhythm, and  volume.  Thought processes were within normal range.  Thought content indicated hallucination.  Memory and concentration were poor.  Insight, judgment, and impulse control were fair.  Consulted with Myrle Sheng, NP, who determined that Pt is to remain in ED overnight, assessed for safety, and then evaluated by psychiatry.  Diagnosis: Schizoaffective Disorder; PTSD  Past Medical History:  Past Medical History:  Diagnosis Date  . Anemia   . Ascites   . Chronic leg pain   . Cirrhosis ([place])   . GERD (gastroesophageal reflux disease)   . H/O diabetes insipidus   . Hepatitis C    Completed therapy with Ep[facility]a ~ 2018.  suspect therapy overseen by [facility] in [place], [place]  . History of adrenal insufficiency   . History of ARDS   . History of blood transfusion   . Hypothyroidism   . Kidney disease    stage 3  . Neuropathy   . Overdose 12/2009  . Peripheral neuropathy   . Personality disorder ([employer])   . PTSD (post-traumatic stress disorder)    SECONDARY TO WAR IN [place]  . Schizoaffective disorder [facility])     Past Surgical History:  Procedure Laterality Date  . CERVICAL SPINE SURGERY  unknown  . RIGHT HIP SURGERY  1996  . US GUIDED LEFT THORACENTESIS  01/21/2010    Family History:  Family History  Problem Relation Age of Onset  . Cancer Mother   . Heart attack Father     Social History:  reports that he has been smoking cigarettes. He has  a 25.00 pack-year smoking history. He has never used smokeless tobacco. He reports that he does not drink alcohol or use drugs.  Additional Social History:  Alcohol / Drug Use Pain Medications: See MAR Prescriptions: See MAR Over the Counter: See MAR History of alcohol / drug use?: No history of alcohol / drug abuse  CIWA: CIWA-Ar BP: (!) 143/77 Pulse Rate: 72 Nausea and Vomiting: no nausea and no vomiting Tactile Disturbances: none Tremor: no tremor Auditory Disturbances: not present Paroxysmal Sweats: no sweat  visible Visual Disturbances: not present Anxiety: mildly anxious Headache, Fullness in Head: none present Agitation: somewhat more than normal activity Orientation and Clouding of Sensorium: cannot do serial additions or is uncertain about date CIWA-Ar Total: 3 COWS:    Allergies: No Known Allergies  Home Medications: (Not in a hospital admission)   OB/GYN Status:  No LMP for male patient.  General Assessment Data Location of Assessment: [facility] ED TTS Assessment: In system Is this a Tele or Face-to-Face Assessment?: Tele Assessment Is this an Initial Assessment or a Re-assessment for this encounter?: Initial Assessment Patient Accompanied by:: N/A Language Other than English: No Living Arrangements: Other (Comment) What gender do you identify as?: Male Marital status: Single Living Arrangements: Alone Can pt return to current living arrangement?: Yes Admission Status: Voluntary Is patient capable of signing voluntary admission?: Yes Referral Source: Self/Family/Friend Insurance type: [place] M[employer]     Crisis Care Plan Living Arrangements: Alone Name of Psychiatrist: None indicated Name of Therapist: None indicated     Risk to self with the past 6 months Suicidal Ideation: Yes-Currently Present Has patient been a risk to self within the past 6 months prior to admission? : No Suicidal Intent: No Has patient had any suicidal intent within the past 6 months prior to admission? : No Is patient at risk for suicide?: Yes Suicidal Plan?: No Has patient had any suicidal plan within the past 6 months prior to admission? : No Access to Means: No What has been your use of drugs/alcohol within the last 12 months?: denied Previous Attempts/Gestures: Yes How many times?: 1(At least once -- ''It was a different time'') Triggers for Past Attempts: Unknown Intentional Self Injurious Behavior: None Family Suicide History: Unknown Recent stressful life event(s): Other (Comment)(Not on  medication) Persecutory voices/beliefs?: No Depression: Yes Depression Symptoms: Despondent, Insomnia, Isolating, Feeling worthless/self pity Substance abuse history and/or treatment for substance abuse?: No Suicide prevention information given to non-admitted patients: Not applicable  Risk to Others within the past 6 months Homicidal Ideation: No Does patient have any lifetime risk of violence toward others beyond the six months prior to admission? : No Thoughts of Harm to Others: No Current Homicidal Intent: No Current Homicidal Plan: No Access to Homicidal Means: No History of harm to others?: No Assessment of Violence: None Noted Does patient have access to weapons?: No Criminal Charges Pending?: No Does patient have a court date: No Is patient on probation?: No  Psychosis Hallucinations: Auditory Delusions: None noted  Mental Status Report Appearance/Hygiene: Unremarkable, In scrubs Eye Contact: Good Motor Activity: Freedom of movement, Unremarkable Speech: Other (Comment), Logical/coherent(accented) Level of Consciousness: Alert Mood: Depressed, Preoccupied Affect: Appropriate to circumstance Anxiety Level: None Thought Processes: Coherent, Relevant Judgement: Partial Orientation: Person, Place, Situation, Time Obsessive Compulsive Thoughts/Behaviors: None  Cognitive Functioning Concentration: Good Memory: Remote Intact, Recent Intact Is patient IDD: No Insight: Fair Impulse Control: Fair Appetite: Fair Have you had any weight changes? : No Change Sleep: Decreased Total Hours of Sleep: 40(40  minutes per night) Vegetative Symptoms: None  ADLScreening [facility] Assessment Services) Patient's cognitive ability adequate to safely complete daily activities?: Yes Patient able to express need for assistance with ADLs?: Yes Independently performs ADLs?: Yes (appropriate for developmental age)  Prior Inpatient Therapy Prior Inpatient Therapy: Yes Prior Therapy Dates:  Pt could not recall Prior Therapy Facilty/Provider(s): Pt could not recall Reason for Treatment: Schizoaffective d/o; PTSD  Prior Outpatient Therapy Prior Outpatient Therapy: Yes Prior Therapy Dates: Pt could not recall Prior Therapy Facilty/Provider(s): Pt could not recall Reason for Treatment: Schizoaffective Disorder Does patient have an ACCT team?: No Does patient have Intensive In-House Services?  : No Does patient have [facility] services? : No Does patient have P4CC services?: No  ADL Screening (condition at time of admission) Patient's cognitive ability adequate to safely complete daily activities?: Yes Is the patient deaf or have difficulty hearing?: No Does the patient have difficulty seeing, even when wearing glasses/contacts?: No Does the patient have difficulty concentrating, remembering, or making decisions?: No Patient able to express need for assistance with ADLs?: Yes Does the patient have difficulty dressing or bathing?: No Independently performs ADLs?: Yes (appropriate for developmental age) Does the patient have difficulty walking or climbing stairs?: No Weakness of Legs: None Weakness of Arms/Hands: None  Home Assistive Devices/Equipment Home Assistive Devices/Equipment: None  Therapy Consults (therapy consults require a physician order) PT Evaluation Needed: No OT Evalulation Needed: No SLP Evaluation Needed: No Abuse/Neglect Assessment (Assessment to be complete while patient is alone) Abuse/Neglect Assessment Can Be Completed: Yes Physical Abuse: Denies Verbal Abuse: Denies Sexual Abuse: Denies Exploitation of patient/patient's resources: Denies Self-Neglect: Denies Values / Beliefs Cultural Requests During Hospitalization: None Spiritual Requests During Hospitalization: None Consults Spiritual Care Consult Needed: No Social 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  Names of all persons participating in this telemedicine service and their role in this encounter. Name: [facility] Role: Pt             Earline Mayotte 01/07/2019 1:34 PM

## 2019-01-07 NOTE — ED Notes (Signed)
States he is not SI or HI but he just wants to speak to psychiatrist.

## 2019-01-07 NOTE — ED Provider Notes (Addendum)
[name]ENCY [facility] Provider Note   CSN: [id number] Arrival date & time: 01/07/19  1140     History   Chief Complaint Chief Complaint  Patient presents with  . Suicidal    HPI [name] is a 58 y.o. male.     Benjamin [name] is a 58 y.o. male with a history of chronic pain, cirrhosis, hepatitis C, GERD, personality disorder, PTSD, schizoaffective disorder, who presents to the emergency department reporting suicidal ideations.  Patient has been seen numerous times in the emergency department in the last 36 hours, has known history of mental illness, but has denied SI or HI but presents at this time reporting suicidal ideations without specific plan.  He reports that he has had suicidal ideations intermittently over the past 2 days, I brought up that patient had denied this multiple times before and he repeatedly states "I do have them, I feel suicidal I want to kill myself".  He denies HI or AVH.  Reports that he feels he is "invalid and disabled", patient reports that he does not have any family resources or help at home.  On 1 of his recent visits yesterday Case management and social work were consulted and home health orders were placed.  Patient also had work-up for chest pain yesterday which was all very reassuring.  Currently he complains of his chronic pain as well as pain in his left hand where he has known first metacarpal fracture that he has repeatedly refused splinting, and again refuses today, but he denies any other acute pain.  No chest pain, shortness of breath, fevers, abdominal pain, nausea or vomiting.      Past Medical History:  Diagnosis Date  . Anemia   . Ascites   . Chronic leg pain   . Cirrhosis (HCC)   . GERD (gastroesophageal reflux disease)   . H/O diabetes insipidus   . Hepatitis C    Completed therapy with [facility] ~ 2018.  suspect therapy over[place] [facility] [place]  . History of adrenal  insufficiency   . History of ARDS   . History of blood transfusion   . Hypothyroidism   . Kidney disease    stage 3  . Neuropathy   . Overdose 12/2009  . Peripheral neuropathy   . Personality disorder (HCC)   . PTSD (post-traumatic stress disorder)    [place]  . Schizoaffective disorder [name](HCC)     Patient Active Problem List   Diagnosis Date Noted  . GI bleed 08/07/2018  . Acute abdominal pain   . Cirrhosis of liver with ascites (HCC)   . Generalized abdominal pain   . Hematemesis with nausea   . Evaluation by psychiatric service required   . Acute upper gastrointestinal bleeding 10/15/2017  . PTSD (post-traumatic stress disorder)   . Hypothyroidism   . History of adrenal insufficiency   . H/O diabetes insipidus   . Schizoaffective disorder (HCC) 11/08/2011  . Personality disorder (HCC)   . Anemia   . Ascites   . Liver disease   . Hepatitis C   . Kidney disease   . GERD (gastroesophageal reflux disease)   . Psychosis (HCC) 11/07/2011    Past Surgical History:  Procedure Laterality Date  . CERVICAL SPINE SURGERY  unknown  . RIGHT HIP SURGERY  1996  . US GUIDED LEFT THORACENTESIS  01/21/2010        Home Medications    Prior to Admission  medications   Medication Sig Start Date End Date Taking? Authorizing Provider  diphenhydrAMINE (BENADRYL) 25 MG tablet Take 25 mg by mouth at bedtime.    Yes [provider]  ibuprofen (ADVIL) 800 MG tablet Take 800 mg by mouth every 8 (eight) hours as needed for mild pain.   Yes [provider]  LORazepam (ATIVAN) 0.5 MG tablet Take 0.5 mg by mouth at bedtime.    Yes [provider]  [employer] D3 2000 units CAPS Take 2,000 Units by mouth daily. 10/23/17   [provider]  fluPHENAZine (PROLIXIN) 5 MG tablet Take 5 mg by mouth 3 (three) times daily.    [provider]  gabapentin (NEURONTIN) 100 MG capsule Take 200 mg by mouth 3 (three) times daily. 07/21/18   [provider]  meloxicam (MOBIC) 15 MG tablet Take 15 mg by mouth daily. 07/06/18   [provider]  Oxycodone HCl 10 MG TABS Take 10 mg by mouth every 8 (eight) hours. 10/18/18   [provider]  predniSONE (DELTASONE) 50 MG tablet Take 1 tablet (50 mg total) by mouth daily. 11/26/18   Lawyer, Christopher, PA-C  SENSIPAR 30 MG tablet Take 30 mg by mouth daily. 07/25/17   [provider]  trihexyphenidyl (ARTANE) 2 MG tablet Take 2 mg by mouth 3 (three) times daily.    [provider]  colchicine 0.6 MG tablet Take 1 tablet (0.6 mg total) by mouth daily. Patient not taking: Reported on 11/05/2018 08/26/18 12/31/18  Eustaquio Maize, PA-C  pantoprazole (PROTONIX) 40 MG tablet Take 1 tablet (40 mg total) by mouth 2 (two) times daily. Patient not taking: Reported on 10/01/2018 10/18/17 12/31/18  Erick Colace, NP    Family History Family History  Problem Relation Age of Onset  . Cancer Mother   . Heart attack Father     Social History Social History   Tobacco Use  . Smoking status: Current Every Day Smoker    Packs/day: 1.00    Years: 25.00    Pack years: 25.00    Types: Cigarettes  . Smokeless tobacco: Never Used  Substance Use Topics  . Alcohol use: No    Comment: Pt denies   . Drug use: No    Comment: Pt denies     Allergies   Patient has no known allergies.   Review of Systems Review of Systems  Constitutional: Negative for chills and fever.  HENT: Negative.   Respiratory: Negative for cough and shortness of breath.   Cardiovascular: Negative for chest pain.  Gastrointestinal: Negative for abdominal pain, nausea and vomiting.  Musculoskeletal: Positive for arthralgias and myalgias.       Chronic musculoskeletal pain, as well as pain from left first metacarpal fracture which patient refuses splinting for  Skin: Positive for color change. Negative for rash and wound.  Neurological: Negative for dizziness, syncope, light-headedness and  headaches.  Psychiatric/Behavioral: Positive for dysphoric mood and suicidal ideas. The patient is nervous/anxious.   All other systems reviewed and are negative.    Physical Exam Updated Vital Signs BP (!) 143/77 (BP Location: Right Arm)   Pulse 72   Temp 98.1 F (36.7 C) (Oral)   Resp 18   Ht 6\' 6"  (1.981 m)   Wt 99.8 kg   SpO2 100%   BMI 25.43 kg/m   Physical Exam Vitals signs and nursing note reviewed.  Constitutional:      General: He is not in acute distress.    Appearance:  Normal appearance. He is well-developed and normal weight. He is not diaphoretic.     Comments: Patient alert, in no acute distress.  HENT:     Head: Normocephalic and atraumatic.  Eyes:     General:        Right eye: No discharge.        Left eye: No discharge.     Pupils: Pupils are equal, round, and reactive to light.  Neck:     Musculoskeletal: Neck supple.  Cardiovascular:     Rate and Rhythm: Normal rate and regular rhythm.     Heart sounds: Normal heart sounds.  Pulmonary:     Effort: Pulmonary effort is normal. No respiratory distress.     Breath sounds: Normal breath sounds. No wheezing or rales.     Comments: Respirations equal and unlabored, patient able to speak in full sentences, lungs clear to auscultation bilaterally Abdominal:     General: Bowel sounds are normal. There is no distension.     Palpations: Abdomen is soft. There is no mass.     Tenderness: There is no abdominal tenderness. There is no guarding.     Comments: Abdomen soft, nondistended, nontender to palpation in all quadrants without guarding or peritoneal signs  Musculoskeletal:     Comments: Known injury to the with bruising and swelling, 2+ radial pulse, all compartments soft.  Skin:    General: Skin is warm and dry.     Capillary Refill: Capillary refill takes less than 2 seconds.  Neurological:     Mental Status: He is alert.     Coordination: Coordination normal.     Comments: Speech is clear, able to  follow commands Moves extremities without ataxia, coordination intact  Psychiatric:        Mood and Affect: Mood is depressed. Affect is labile.        Speech: Speech normal.        Behavior: Behavior normal. Behavior is cooperative.        Thought Content: Thought content includes suicidal ideation. Thought content does not include homicidal ideation. Thought content does not include homicidal or suicidal plan.      ED Treatments / Results  Labs (all labs ordered are listed, but only abnormal results are displayed) Labs Reviewed - No data to display  EKG None  Radiology Dg Chest [facility] 1 View  Result Date: 01/06/2019 CLINICAL DATA:  Left-sided chest pain. EXAM: PORTABLE CHEST 1 VIEW COMPARISON:  November 05, 2018 FINDINGS: Cardiomediastinal silhouette is stable. Mediastinal contours appear intact. Subtle peribronchial airspace opacities in bilateral lower lung fields, may represent developing pulmonary edema or atypical consolidation. Osseous structures are without acute abnormality. Soft tissues are grossly normal. IMPRESSION: 1. Subtle peribronchial airspace opacities in bilateral lower lung fields, may represent developing pulmonary edema or atypical consolidation. Electronically Signed   By: Ted Mcalpine M.D.   On: 01/06/2019 14:24    Procedures Procedures (including critical care time)  Medications Ordered in ED Medications  diphenhydrAMINE (BENADRYL) tablet 25 mg (has no administration in time range)  ibuprofen (ADVIL) tablet 400 mg (400 mg Oral Given 01/07/19 1451)  LORazepam (ATIVAN) tablet 0.5 mg (has no administration in time range)  oxyCODONE (Oxy IR/ROXICODONE) immediate release tablet 10 mg (has no administration in time range)  oxyCODONE (Oxy IR/ROXICODONE) immediate release tablet 10 mg (10 mg Oral Given 01/07/19 1331)  LORazepam (ATIVAN) tablet 1 mg (1 mg Oral Given 01/07/19 1452)  diphenhydrAMINE (BENADRYL) capsule 50 mg (50 mg Oral Given 01/07/19  1451)   haloperidol lactate (HALDOL) injection 5 mg (5 mg Intramuscular Given 01/07/19 1452)    Initial Impression / Assessment and Plan / ED Course  I have reviewed the triage vital signs and the nursing notes.  Pertinent labs & imaging results that were available during my care of the patient were reviewed by me and considered in my medical decision making (see chart for details).  Patient presents reporting suicidal ideations without specific plan, reports he feels hopeless, reports he feels a disabled and invalid.  Reports he is very depressed and has no family or help at home.  Patient denies any HI or AVH.  He has chronic musculoskeletal pain as well as a recent left first metacarpal fracture that he refuses splinting or immobilization for.  Otherwise he denies any other focal medical complaints.  Patient has been seen in the department multiple times in the last 36 hours and yesterday had lab work performed and chest pain work-up.  Patient also had negative COVID test performed yesterday.  He denies any new or focal complaints since then, and I do not think that repeating lab work will be beneficial at this time.  Will place TTS consult, at this time patient is medically cleared.  TTS recommends overnight observation and repeat psych eval in the morning.  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  Patient yelling frequently.  Medications ordered to help ease agitation.  Pharmacy able to get limited information from patient regarding his current medications.  Attempting to talk with psych  Home medications that were most recently filled by pharmacy were restarted, awaiting for psych recommendations regarding medications, patient will be observed in the ED overnight.  Placed under ED psych hold.  Final Clinical Impressions(s) / ED Diagnoses    Final diagnoses:  Suicidal ideation  PTSD (post-traumatic stress disorder)    ED Discharge Orders    None       Dartha Lodge, PA-C 01/07/19 1542    Dartha Lodge, PA-C 01/07/19 1613    Melene Plan, DO 01/08/19 1501

## 2019-01-08 ENCOUNTER — Other Ambulatory Visit: Payer: Self-pay

## 2019-01-08 ENCOUNTER — Emergency Department (HOSPITAL_COMMUNITY): Payer: Medicaid Other

## 2019-01-08 ENCOUNTER — Telehealth: Payer: Self-pay | Admitting: *Deleted

## 2019-01-08 ENCOUNTER — Encounter (HOSPITAL_COMMUNITY): Payer: Self-pay | Admitting: Internal Medicine

## 2019-01-08 DIAGNOSIS — B182 Chronic viral hepatitis C: Secondary | ICD-10-CM | POA: Diagnosis not present

## 2019-01-08 DIAGNOSIS — K7200 Acute and subacute hepatic failure without coma: Secondary | ICD-10-CM | POA: Diagnosis present

## 2019-01-08 DIAGNOSIS — N179 Acute kidney failure, unspecified: Secondary | ICD-10-CM | POA: Diagnosis present

## 2019-01-08 DIAGNOSIS — D649 Anemia, unspecified: Secondary | ICD-10-CM

## 2019-01-08 DIAGNOSIS — K264 Chronic or unspecified duodenal ulcer with hemorrhage: Secondary | ICD-10-CM | POA: Diagnosis present

## 2019-01-08 DIAGNOSIS — K7460 Unspecified cirrhosis of liver: Secondary | ICD-10-CM

## 2019-01-08 DIAGNOSIS — K2100 Gastro-esophageal reflux disease with esophagitis, without bleeding: Secondary | ICD-10-CM | POA: Diagnosis present

## 2019-01-08 DIAGNOSIS — K209 Esophagitis, unspecified without bleeding: Secondary | ICD-10-CM | POA: Diagnosis not present

## 2019-01-08 DIAGNOSIS — E232 Diabetes insipidus: Secondary | ICD-10-CM | POA: Diagnosis present

## 2019-01-08 DIAGNOSIS — K8020 Calculus of gallbladder without cholecystitis without obstruction: Secondary | ICD-10-CM | POA: Diagnosis present

## 2019-01-08 DIAGNOSIS — I8510 Secondary esophageal varices without bleeding: Secondary | ICD-10-CM | POA: Diagnosis present

## 2019-01-08 DIAGNOSIS — S62337A Displaced fracture of neck of fifth metacarpal bone, left hand, initial encounter for closed fracture: Secondary | ICD-10-CM | POA: Diagnosis present

## 2019-01-08 DIAGNOSIS — F251 Schizoaffective disorder, depressive type: Secondary | ICD-10-CM | POA: Diagnosis present

## 2019-01-08 DIAGNOSIS — K449 Diaphragmatic hernia without obstruction or gangrene: Secondary | ICD-10-CM | POA: Diagnosis present

## 2019-01-08 DIAGNOSIS — R9431 Abnormal electrocardiogram [ECG] [EKG]: Secondary | ICD-10-CM

## 2019-01-08 DIAGNOSIS — N1832 Chronic kidney disease, stage 3b: Secondary | ICD-10-CM | POA: Diagnosis present

## 2019-01-08 DIAGNOSIS — K920 Hematemesis: Secondary | ICD-10-CM | POA: Diagnosis present

## 2019-01-08 DIAGNOSIS — I1310 Hypertensive heart and chronic kidney disease without heart failure, with stage 1 through stage 4 chronic kidney disease, or unspecified chronic kidney disease: Secondary | ICD-10-CM | POA: Diagnosis present

## 2019-01-08 DIAGNOSIS — Z20828 Contact with and (suspected) exposure to other viral communicable diseases: Secondary | ICD-10-CM | POA: Diagnosis present

## 2019-01-08 DIAGNOSIS — K269 Duodenal ulcer, unspecified as acute or chronic, without hemorrhage or perforation: Secondary | ICD-10-CM | POA: Diagnosis not present

## 2019-01-08 DIAGNOSIS — D62 Acute posthemorrhagic anemia: Secondary | ICD-10-CM | POA: Diagnosis present

## 2019-01-08 DIAGNOSIS — R45851 Suicidal ideations: Secondary | ICD-10-CM | POA: Diagnosis present

## 2019-01-08 DIAGNOSIS — E039 Hypothyroidism, unspecified: Secondary | ICD-10-CM | POA: Diagnosis present

## 2019-01-08 DIAGNOSIS — W228XXA Striking against or struck by other objects, initial encounter: Secondary | ICD-10-CM | POA: Diagnosis present

## 2019-01-08 DIAGNOSIS — E2740 Unspecified adrenocortical insufficiency: Secondary | ICD-10-CM | POA: Diagnosis present

## 2019-01-08 DIAGNOSIS — N183 Chronic kidney disease, stage 3 unspecified: Secondary | ICD-10-CM | POA: Diagnosis present

## 2019-01-08 DIAGNOSIS — K219 Gastro-esophageal reflux disease without esophagitis: Secondary | ICD-10-CM

## 2019-01-08 DIAGNOSIS — F4310 Post-traumatic stress disorder, unspecified: Secondary | ICD-10-CM | POA: Diagnosis present

## 2019-01-08 DIAGNOSIS — M109 Gout, unspecified: Secondary | ICD-10-CM | POA: Diagnosis present

## 2019-01-08 DIAGNOSIS — K253 Acute gastric ulcer without hemorrhage or perforation: Secondary | ICD-10-CM | POA: Diagnosis not present

## 2019-01-08 DIAGNOSIS — W19XXXA Unspecified fall, initial encounter: Secondary | ICD-10-CM | POA: Diagnosis present

## 2019-01-08 DIAGNOSIS — K254 Chronic or unspecified gastric ulcer with hemorrhage: Secondary | ICD-10-CM | POA: Diagnosis present

## 2019-01-08 DIAGNOSIS — D689 Coagulation defect, unspecified: Secondary | ICD-10-CM | POA: Diagnosis present

## 2019-01-08 DIAGNOSIS — D696 Thrombocytopenia, unspecified: Secondary | ICD-10-CM | POA: Diagnosis present

## 2019-01-08 LAB — CBC WITH DIFFERENTIAL/PLATELET
Abs Immature Granulocytes: 0.04 10*3/uL (ref 0.00–0.07)
Basophils Absolute: 0.1 10*3/uL (ref 0.0–0.1)
Basophils Relative: 1 %
Eosinophils Absolute: 0.3 10*3/uL (ref 0.0–0.5)
Eosinophils Relative: 4 %
HCT: 27.1 % — ABNORMAL LOW (ref 39.0–52.0)
Hemoglobin: 8.2 g/dL — ABNORMAL LOW (ref 13.0–17.0)
Immature Granulocytes: 0 %
Lymphocytes Relative: 14 %
Lymphs Abs: 1.3 10*3/uL (ref 0.7–4.0)
MCH: 30 pg (ref 26.0–34.0)
MCHC: 30.3 g/dL (ref 30.0–36.0)
MCV: 99.3 fL (ref 80.0–100.0)
Monocytes Absolute: 0.5 10*3/uL (ref 0.1–1.0)
Monocytes Relative: 5 %
Neutro Abs: 7 10*3/uL (ref 1.7–7.7)
Neutrophils Relative %: 76 %
Platelets: 164 10*3/uL (ref 150–400)
RBC: 2.73 MIL/uL — ABNORMAL LOW (ref 4.22–5.81)
RDW: 19 % — ABNORMAL HIGH (ref 11.5–15.5)
WBC: 9.2 10*3/uL (ref 4.0–10.5)
nRBC: 0 % (ref 0.0–0.2)

## 2019-01-08 LAB — APTT: aPTT: 31 seconds (ref 24–36)

## 2019-01-08 LAB — COMPREHENSIVE METABOLIC PANEL
ALT: 11 U/L (ref 0–44)
AST: 20 U/L (ref 15–41)
Albumin: 2.5 g/dL — ABNORMAL LOW (ref 3.5–5.0)
Alkaline Phosphatase: 56 U/L (ref 38–126)
Anion gap: 5 (ref 5–15)
BUN: 38 mg/dL — ABNORMAL HIGH (ref 6–20)
CO2: 23 mmol/L (ref 22–32)
Calcium: 8.6 mg/dL — ABNORMAL LOW (ref 8.9–10.3)
Chloride: 114 mmol/L — ABNORMAL HIGH (ref 98–111)
Creatinine, Ser: 2.15 mg/dL — ABNORMAL HIGH (ref 0.61–1.24)
GFR calc Af Amer: 38 mL/min — ABNORMAL LOW (ref >60)
GFR calc non Af Amer: 33 mL/min — ABNORMAL LOW (ref >60)
Glucose, Bld: 103 mg/dL — ABNORMAL HIGH (ref 70–99)
Potassium: 4.6 mmol/L (ref 3.5–5.1)
Sodium: 142 mmol/L (ref 135–145)
Total Bilirubin: 1.7 mg/dL — ABNORMAL HIGH (ref 0.3–1.2)
Total Protein: 4.9 g/dL — ABNORMAL LOW (ref 6.5–8.1)

## 2019-01-08 LAB — SARS CORONAVIRUS 2 (TAT 6-24 HRS): SARS Coronavirus 2: NEGATIVE

## 2019-01-08 LAB — HEMOGLOBIN AND HEMATOCRIT, BLOOD
HCT: 24.3 % — ABNORMAL LOW (ref 39.0–52.0)
Hemoglobin: 7.1 g/dL — ABNORMAL LOW (ref 13.0–17.0)

## 2019-01-08 LAB — HIV ANTIBODY (ROUTINE TESTING W REFLEX): HIV Screen 4th Generation wRfx: NONREACTIVE

## 2019-01-08 LAB — RAPID URINE DRUG SCREEN, HOSP PERFORMED
Amphetamines: NOT DETECTED
Barbiturates: NOT DETECTED
Benzodiazepines: NOT DETECTED
Cocaine: NOT DETECTED
Opiates: NOT DETECTED
Tetrahydrocannabinol: NOT DETECTED

## 2019-01-08 LAB — AMMONIA: Ammonia: 113 umol/L — ABNORMAL HIGH (ref 9–35)

## 2019-01-08 LAB — TSH: TSH: 0.707 u[IU]/mL (ref 0.350–4.500)

## 2019-01-08 LAB — PROTIME-INR
INR: 1.6 — ABNORMAL HIGH (ref 0.8–1.2)
Prothrombin Time: 18.4 seconds — ABNORMAL HIGH (ref 11.4–15.2)

## 2019-01-08 LAB — ETHANOL: Alcohol, Ethyl (B): <10 mg/dL (ref <10)

## 2019-01-08 LAB — CK: Total CK: 107 U/L (ref 49–397)

## 2019-01-08 LAB — POC OCCULT BLOOD, ED: Fecal Occult Bld: NEGATIVE

## 2019-01-08 LAB — ABO/RH: ABO/RH(D): A POS

## 2019-01-08 LAB — PREPARE RBC (CROSSMATCH)

## 2019-01-08 MED ORDER — ONDANSETRON HCL 4 MG/2ML IJ SOLN: 4.0000 mg | Freq: Four times a day (QID) | INTRAMUSCULAR | Status: DC | PRN

## 2019-01-08 MED ORDER — PANTOPRAZOLE SODIUM 40 MG IV SOLR
40.0000 mg | Freq: Two times a day (BID) | INTRAVENOUS | Status: DC
  Administered 2019-01-08 – 2019-01-10 (×4): 40 mg via INTRAVENOUS
  Filled 2019-01-08 (×4): qty 40

## 2019-01-08 MED ORDER — SODIUM CHLORIDE 0.9 % IV SOLN
50.0000 ug/h | INTRAVENOUS | Status: DC
  Administered 2019-01-08 – 2019-01-10 (×4): 50 ug/h via INTRAVENOUS
  Filled 2019-01-08 (×7): qty 1

## 2019-01-08 MED ORDER — SODIUM CHLORIDE 0.9% FLUSH
3.0000 mL | Freq: Two times a day (BID) | INTRAVENOUS | Status: DC
  Administered 2019-01-08 – 2019-01-10 (×6): 3 mL via INTRAVENOUS

## 2019-01-08 MED ORDER — ALBUTEROL SULFATE (2.5 MG/3ML) 0.083% IN NEBU: 2.5000 mg | INHALATION_SOLUTION | Freq: Four times a day (QID) | RESPIRATORY_TRACT | Status: DC | PRN

## 2019-01-08 MED ORDER — LACTULOSE 10 GM/15ML PO SOLN
30.0000 g | Freq: Three times a day (TID) | ORAL | Status: DC
  Administered 2019-01-08 – 2019-01-11 (×6): 30 g via ORAL
  Filled 2019-01-08 (×8): qty 45

## 2019-01-08 MED ORDER — OXYCODONE HCL 5 MG PO TABS
5.0000 mg | ORAL_TABLET | Freq: Four times a day (QID) | ORAL | Status: DC | PRN
  Administered 2019-01-08 (×2): 5 mg via ORAL
  Filled 2019-01-08 (×2): qty 1

## 2019-01-08 MED ORDER — ACETAMINOPHEN 650 MG RE SUPP: 650.0000 mg | Freq: Four times a day (QID) | RECTAL | Status: DC | PRN

## 2019-01-08 MED ORDER — SODIUM CHLORIDE 0.9 % IV SOLN
INTRAVENOUS | Status: DC
  Administered 2019-01-08 – 2019-01-10 (×3): via INTRAVENOUS

## 2019-01-08 MED ORDER — PROCHLORPERAZINE EDISYLATE 10 MG/2ML IJ SOLN: 5.0000 mg | Freq: Four times a day (QID) | INTRAMUSCULAR | Status: DC | PRN

## 2019-01-08 MED ORDER — SODIUM CHLORIDE 0.9% IV SOLUTION
Freq: Once | INTRAVENOUS | Status: AC
  Administered 2019-01-08: 21:00:00 via INTRAVENOUS

## 2019-01-08 MED ORDER — ACETAMINOPHEN 325 MG PO TABS
650.0000 mg | ORAL_TABLET | Freq: Four times a day (QID) | ORAL | Status: DC | PRN
  Filled 2019-01-08: qty 2

## 2019-01-08 MED ORDER — SODIUM CHLORIDE 0.9 % IV SOLN
80.0000 mg | Freq: Once | INTRAVENOUS | Status: AC
  Administered 2019-01-08: 80 mg via INTRAVENOUS
  Filled 2019-01-08: qty 80

## 2019-01-08 MED ORDER — ONDANSETRON HCL 4 MG PO TABS: 4.0000 mg | ORAL_TABLET | Freq: Four times a day (QID) | ORAL | Status: DC | PRN

## 2019-01-08 NOTE — ED Provider Notes (Signed)
Patient in the ED with chief complaint of suicidal ideation.  Patient was to be reassessed by behavioral health this morning.  I was called to the bedside as patient had been given some food and then vomited a large quantity of blood. Patient is awake but slow to respond.  Review of records reveal that he has received multiple medications in ED which could impact his mental status including Ativan and oxycodone, Haldol, Review of records reveal the patient has been evaluated for chronic pain here on numerous occasions and has also been evaluated for depression. Admission May 2020 patient had diagnosis of GI bleed with significant anemia.  He required transfusion and again refused GI work-up. Hospital admission July 20 , 2019was for rectal bleeding with discharge diagnosis of acute upper GI bleed, cirrhosis, hep C, and acute blood loss anemia with coagulopathy, thrombocytopenia, and chronic kidney disease.  During that evaluation, patient refused further work-up with endoscopy.  He was treated with Protonix and octreotide. Well-developed poorly kept male with multiple bruising who is laying in the bed with decreased responsiveness Head reveals no obvious signs of trauma he has blood covering his face and right upper extremity.  Blood is cleaned away, there are no obvious signs of trauma on the head or oropharynx There are multiple different aged bruises throughout all of his extremities and his trunk. Review of records did not show labs drawn 2 days ago with hemoglobin of 8.8 which has been trending down over the past 2 months.  It had nadired previously in May at 4.7 Patient presenting with depression and chronic pain now with hematemesis. Differential diagnosis including PUD, variceal disease, known liver disease  ugi- hgb 8.2 today<8.8<9.6 Creatinine 2.15<2.26(10/12 Ammonia at 113 today- last 35 on 10/17/17  Cirrhosis with ams Hematemesis Anemia ams likely with component of hepatic  encephalopathy-do not plan lactulose at this time with GI bleeding and hematemesis  CRITICAL CARE Performed by: Pattricia Boss Total critical care time: 45 minutes Critical care time was exclusive of separately billable procedures and treating other patients. Critical care was necessary to treat or prevent imminent or life-threatening deterioration. Critical care was time spent personally by me on the following activities: development of treatment plan with patient and/or surrogate as well as nursing, discussions with consultants, evaluation of patient's response to treatment, examination of patient, obtaining history from patient or surrogate, ordering and performing treatments and interventions, ordering and review of laboratory studies, ordering and review of radiographic studies, pulse oximetry and re-evaluation of patient's condition.    12:20 PM Discussed with Nancy Marus, on-call for gastroenterology.  We discussed his benefits of octreotide.  Patient without definitive diagnosis of varices.  He has not had any repeat hematemesis since initial episode.  GI is aware and will consult on patient.     Pattricia Boss, MD 01/08/19 1220

## 2019-01-08 NOTE — ED Notes (Signed)
Reassess in AM-No Sitter avail

## 2019-01-08 NOTE — ED Notes (Signed)
Pt repeating "I want kill self" over and over

## 2019-01-08 NOTE — ED Notes (Addendum)
When assessing patient, patient was found next to bloody emesis around patients face. Patient states he threw up blood and did not want to tell anyone. This RN notified Dr. Jeanell Sparrow who is at bedside assessing patient. Patient has no complaints of pain but is more lethargic than he was on previous assessment. Will place IV and obtain labs. Patient moved into Trauma A.

## 2019-01-08 NOTE — ED Notes (Addendum)
Pt has had three small, black, liquid bowel movements. Pt also ate clear liquid diet that was at bedside then immediately had BM, denies nausea at present. Demanding pain medications which were provided as ordered. Pt also requested social worker in which has been consulted. Sitter at bedside.   Bruising noted to variety of places on body with various stages of healing. L hand purple and swollen, CMS intact

## 2019-01-08 NOTE — ED Notes (Addendum)
Pt continues to refuse to have splint applied to L hand, pt moaning loudly requesting pain medication, pt has been explained multiple times pain medication can only be given every 8 hours.  Pt requesting different nurse at this time. Will continue to monitor.

## 2019-01-08 NOTE — Progress Notes (Signed)
Repeat hemoglobin 7.1 g/dL.  Transfuse 1 unit packed red blood cells.  GI consulted and plans on doing EGD in a.m.  N.p.o. after midnight except for medications.  Will need to continue to monitor H&H and transfuse blood products as needed.

## 2019-01-08 NOTE — Progress Notes (Signed)
CSW in contact with Pt, via telephone. CSW inquired about pts needs. CSW listened to patient as he explained that he needed assistance with paying for bills. Pt reported " I hear problems". Pt requested that CSW get in contact with his friend to notify him of his ED visit. Pt could not recall the friends phone number. Pt continued to speak on hearing angels. CSW suggested that pt contact his friend when he discharges home.   [place] [facility]  Clinical Social Worker  Ph: [phone]

## 2019-01-08 NOTE — ED Notes (Signed)
Breakfast ordered 

## 2019-01-08 NOTE — H&P (Addendum)
History and Physical    [name] UUV:[id number] DOB: 1961/01/12 DOA: 01/07/2019  Referring MD/NP/PA: Pattricia Boss PCP: Nolene Ebbs, MD  Patient coming from: Via EMS  Chief Complaint: Suicidal ideation  I have personally briefly reviewed patient's old medical records in [place]   HPI: [name] is a 58 y.o. male with medical history significant of cirrhosis with splenomegaly and esophageal varices, hepatitis C previous treated, adrenal insufficiency, hypothyroidism, schizoaffective disorder, PTSD, coagulopathy prior overdose, GI bleed, and GERD; who presented yesterday with complaints of suicidal ideation.  Patient notes that every year around this time he has thoughts of wanting to hurt himself, but did not go into further details.  He reports falling a couple days ago onto his left hand, but denies any loss of consciousness or trauma to his head.  At baseline patient has chronic pain in addition to pain in his left hand, and had recently ran out of his pain medications.  Currently denies having any chest pain, shortness of breath, fever, chills, nausea, or abdominal pain at this time.  Unclear if the patient has been taking NSAIDs, but appear on his medication list.  Patient has had several emergency department visits in the last 2 weeks.  Last admitted into the hospital back in May where patient was noted to have a GI bleed requiring transfusion of blood products but ultimately refused EGD and left AGAINST MEDICAL ADVICE.  ED Course: Upon admission to the emergency department patient was found to be afebrile with vital signs within normal limits.   Patient had been given Benadryl, Ativan, Haldol, and oxycodone overnight.  This morning patient was noted to have at least one episode of vomiting with blood present.  Chest x-ray showed subtle bilateral airspace opacities lower lung fields on 10/10, repeat chest x-ray only notes cardiomegaly without acute interstitial disease.  On 10/8  was seen a comminuted 5th metacarpal fracture with impaction and angulation on the left hand.  Reportedly offered splinting for the left hand multiple times, but refused.  Labs significant for hemoglobin 8.2, BUN 38, and creatinine 2.15. Stool guaiacs were noted to be negative.  Patient was given 80 mg of Protonix.  GI to be formally consulted by ED physician.  TRH called to admit.  Review of Systems  Constitutional: Negative for chills and fever.  HENT: Negative for ear discharge and hearing loss.   Eyes: Positive for double vision. Negative for photophobia and pain.  Respiratory: Negative for cough and shortness of breath.   Cardiovascular: Negative for chest pain and leg swelling.  Gastrointestinal: Positive for vomiting. Negative for abdominal pain and diarrhea.  Genitourinary: Negative for frequency.  Musculoskeletal: Positive for falls, joint pain and myalgias.  Skin: Negative for itching.  Neurological: Negative for focal weakness and loss of consciousness.  Endo/Heme/Allergies: Bruises/bleeds easily.  Psychiatric/Behavioral: Positive for depression and suicidal ideas.    Past Medical History:  Diagnosis Date   Anemia    Ascites    Chronic leg pain    Cirrhosis (HCC)    GERD (gastroesophageal reflux disease)    H/O diabetes insipidus    Hepatitis C    Completed therapy with [facility] ~ 2018.  suspect therapy overseen by [facility] in [place], [place]   History of adrenal insufficiency    History of ARDS    History of blood transfusion    Hypothyroidism    Kidney disease    stage 3   Neuropathy    Overdose 12/2009   Peripheral  neuropathy    Personality disorder (HCC)    PTSD (post-traumatic stress disorder)    SECONDARY TO WAR IN [place]   Schizoaffective disorder [facility])     Past Surgical History:  Procedure Laterality Date   CERVICAL SPINE SURGERY  unknown   RIGHT HIP SURGERY  1996   US GUIDED LEFT THORACENTESIS  01/21/2010      reports that he has been smoking cigarettes. He has a 25.00 pack-year smoking history. He has never used smokeless tobacco. He reports that he does not drink alcohol or use drugs.  No Known Allergies  Family History  Problem Relation Age of Onset   Cancer Mother    Heart attack Father     Prior to Admission medications   Medication Sig Start Date End Date Taking? Authorizing Provider  diphenhydrAMINE (BENADRYL) 25 MG tablet Take 25 mg by mouth at bedtime.    Yes [provider]  ibuprofen (ADVIL) 800 MG tablet Take 800 mg by mouth every 8 (eight) hours as needed for mild pain.   Yes [provider]  LORazepam (ATIVAN) 0.5 MG tablet Take 0.5 mg by mouth at bedtime.    Yes [provider]  [employer] D3 2000 units CAPS Take 2,000 Units by mouth daily. 10/23/17   [provider]  fluPHENAZine (PROLIXIN) 5 MG tablet Take 5 mg by mouth 3 (three) times daily.    [provider]  gabapentin (NEURONTIN) 100 MG capsule Take 200 mg by mouth 3 (three) times daily. 07/21/18   [provider]  meloxicam (MOBIC) 15 MG tablet Take 15 mg by mouth daily. 07/06/18   [provider]  Oxycodone HCl 10 MG TABS Take 10 mg by mouth every 8 (eight) hours. 10/18/18   [provider]  predniSONE (DELTASONE) 50 MG tablet Take 1 tablet (50 mg total) by mouth daily. 11/26/18   Lawyer, Christopher, PA-C  SENSIPAR 30 MG tablet Take 30 mg by mouth daily. 07/25/17   [provider]  trihexyphenidyl (ARTANE) 2 MG tablet Take 2 mg by mouth 3 (three) times daily.    [provider]  colchicine 0.6 MG tablet Take 1 tablet (0.6 mg total) by mouth daily. Patient not taking: Reported on 11/05/2018 08/26/18 12/31/18  Tanda Rockers, PA-C  pantoprazole (PROTONIX) 40 MG tablet Take 1 tablet (40 mg total) by mouth 2 (two) times daily. Patient not taking: Reported on 10/01/2018 10/18/17 12/31/18  Simonne Martinet, NP    Physical Exam:  Constitutional:  Middle-age male who appears older than stated age in no acute distress and resting. Vitals:   01/08/19 0830 01/08/19 0845 01/08/19 0900 01/08/19 0945  BP: 125/68 115/70 120/69 111/75  Pulse:   95 96  Resp:   18   Temp:      TempSrc:      SpO2:   99% 100%  Weight:      Height:       Eyes: PERRL, lids and conjunctivae normal ENMT: Mucous membranes are dry. Posterior pharynx clear of any exudate or lesions. Neck: normal, supple, no masses, no thyromegaly Respiratory: clear to auscultation bilaterally, no wheezing, no crackles. Normal respiratory effort. No accessory muscle use.  Cardiovascular: Regular rate and rhythm, no murmurs / rubs / gallops.  Trace lower extremity edema. 2+ pedal pulses. No carotid bruits.  Abdomen: no tenderness, no masses palpated. No hepatosplenomegaly. Bowel sounds positive.  Musculoskeletal: no clubbing / cyanosis.  Bruising and swelling present of the left hand. Skin: no rashes, lesions, ulcers. No  induration Neurologic: CN 2-12 grossly intact. Sensation intact, DTR normal. Strength 5/5 in all 4.  Psychiatric: Normal judgment and insight.  Lethargic, but easily arousable to voice.  Depressed mood.   Labs on Admission: I have personally reviewed following labs and imaging studies  CBC: Recent Labs  Lab 01/06/19 1437 01/08/19 0846  WBC 5.4 9.2  NEUTROABS  --  7.0  HGB 8.8* 8.2*  HCT 28.8* 27.1*  MCV 97.6 99.3  PLT 104* 164   Basic Metabolic Panel: Recent Labs  Lab 01/06/19 1437 01/08/19 0846  NA 141 142  K 4.1 4.6  CL 112* 114*  CO2 21* 23  GLUCOSE 93 103*  BUN 27* 38*  CREATININE 2.26* 2.15*  CALCIUM 8.6* 8.6*   GFR: Estimated Creatinine Clearance: 48.4 mL/min (A) (by C-G formula based on SCr of 2.15 mg/dL (H)). Liver Function Tests: Recent Labs  Lab 01/08/19 0846  AST 20  ALT 11  ALKPHOS 56  BILITOT 1.7*  PROT 4.9*  ALBUMIN 2.5*   No results for input(s): LIPASE, AMYLASE in the last 168 hours. Recent Labs  Lab 01/08/19 0846   AMMONIA 113*   Coagulation Profile: No results for input(s): INR, PROTIME in the last 168 hours. Cardiac Enzymes: Recent Labs  Lab 01/08/19 0846  CKTOTAL 107   BNP (last 3 results) No results for input(s): PROBNP in the last 8760 hours. HbA1C: No results for input(s): HGBA1C in the last 72 hours. CBG: No results for input(s): GLUCAP in the last 168 hours. Lipid Profile: No results for input(s): CHOL, HDL, LDLCALC, TRIG, CHOLHDL, LDLDIRECT in the last 72 hours. Thyroid Function Tests: No results for input(s): TSH, T4TOTAL, FREET4, T3FREE, THYROIDAB in the last 72 hours. Anemia Panel: No results for input(s): VITAMINB12, FOLATE, FERRITIN, TIBC, IRON, RETICCTPCT in the last 72 hours. Urine analysis:    Component Value Date/Time   COLORURINE YELLOW 08/07/2018 0634   APPEARANCEUR CLEAR 08/07/2018 0634   LABSPEC 1.009 08/07/2018 0634   PHURINE 6.0 08/07/2018 0634   GLUCOSEU NEGATIVE 08/07/2018 0634   HGBUR NEGATIVE 08/07/2018 0634   BILIRUBINUR NEGATIVE 08/07/2018 0634   KETONESUR NEGATIVE 08/07/2018 0634   PROTEINUR NEGATIVE 08/07/2018 0634   UROBILINOGEN 1.0 01/19/2013 0015   NITRITE NEGATIVE 08/07/2018 0634   LEUKOCYTESUR NEGATIVE 08/07/2018 0634   Sepsis Labs: Recent Results (from the past 240 hour(s))  SARS CORONAVIRUS 2 (TAT 6-24 HRS) Nasopharyngeal Nasopharyngeal Swab     Status: None   Collection Time: 01/06/19  2:43 PM   Specimen: Nasopharyngeal Swab  Result Value Ref Range Status   SARS Coronavirus 2 NEGATIVE NEGATIVE Final    Comment: (NOTE) SARS-CoV-2 target nucleic acids are NOT DETECTED. The SARS-CoV-2 RNA is generally detectable in upper and lower respiratory specimens during the acute phase of infection. Negative results do not preclude SARS-CoV-2 infection, do not rule out co-infections with other pathogens, and should not be used as the sole basis for treatment or other patient management decisions. Negative results must be combined with clinical  observations, patient history, and epidemiological information. The expected result is Negative. Fact Sheet for Patients: HairSlick.no Fact Sheet for Healthcare Providers: quierodirigir.com This test is not yet approved or cleared by the [place] FDA and  has been authorized for detection and/or diagnosis of SARS-CoV-2 by FDA under an Emergency Use Authorization (EUA). This EUA will remain  in effect (meaning this test can be used) for the duration of the COVID-19 declaration under Section 56 4(b)(1) of the Act, 21 U.S.C. section 360bbb-3(b)(1), unless the  authorization is terminated or revoked sooner. Performed at [facility], [address] [place], [place] [id number]      Radiological Exams on Admission: Ct Head Wo Contrast  Result Date: 01/08/2019 CLINICAL DATA:  Altered level of consciousness, unexplained. EXAM: CT HEAD WITHOUT CONTRAST TECHNIQUE: Contiguous axial images were obtained from the base of the skull through the vertex without intravenous contrast. COMPARISON:  10/27/2010 FINDINGS: Brain: Generalized atrophy as before, no acute intracranial process, no evidence of mass, mass effect, midline shift or intracranial hemorrhage. Vascular: No hyperdense vessel or unexpected calcification. Skull: Signs of cervical-occipital fusion as before. No acute process Sinuses/Orbits: No acute finding. Other: None. IMPRESSION: 1. Generalized atrophy as before, no acute intracranial findings. Electronically Signed   By: Donzetta KohutGeoffrey  Wile M.D.   On: 01/08/2019 10:31   Dg Chest Port 1 View  Result Date: 01/08/2019 CLINICAL DATA:  Hematemesis EXAM: PORTABLE CHEST 1 VIEW COMPARISON:  01/06/2019 FINDINGS: Cardiomegaly. No confluent opacities, effusions or edema. No acute bony abnormality. IMPRESSION: Cardiomegaly.  No active disease. Electronically Signed   By: Charlett NoseKevin  Dover M.D.   On: 01/08/2019 11:30   Dg Chest Port 1  View  Result Date: 01/06/2019 CLINICAL DATA:  Left-sided chest pain. EXAM: PORTABLE CHEST 1 VIEW COMPARISON:  November 05, 2018 FINDINGS: Cardiomediastinal silhouette is stable. Mediastinal contours appear intact. Subtle peribronchial airspace opacities in bilateral lower lung fields, may represent developing pulmonary edema or atypical consolidation. Osseous structures are without acute abnormality. Soft tissues are grossly normal. IMPRESSION: 1. Subtle peribronchial airspace opacities in bilateral lower lung fields, may represent developing pulmonary edema or atypical consolidation. Electronically Signed   By: Ted Mcalpineobrinka  Dimitrova M.D.   On: 01/06/2019 14:24    EKG: Independently reviewed.  Sinus rhythm at 80 bpm with QTc 523 on 10/11  Assessment/Plan Hematemesis, normocytic anemia, history of esophageal varices: Acute.  Patient presents after having an acute episode of bloody emesis.  Hemoglobin 8.2 g/dL which appear to be trending down.  Elevated BUN suggest upper GI bleed. Stool guaiacs were noted to be negative.  Chest x-ray noted cardiomegaly without any acute abnormality.  Patient with previous history of GI bleed back in May of this year, but refused EGD at that time.  CT scan of the abdomen pelvis in May noted esophageal varices. Gastroenterology consulted.  Patient had initially been given Protonix 80 mg IV. -Admit to a progressive bed -Aspiration precaution -Clear liquid diet -Discontinue Mobic -Add-on PT/INR and aPTT -Serial monitoring of H&H -Octreotide drip -Normal saline at 75 mL/h -Appreciate GI consultative service, w ill follow for further recommendation  Acute hepatic encephalopathy, hepatic cirrhosis: On admission ammonia level elevated at 113.  Patient does not appear to be on lactulose at home. -Neurochecks -Limiting sedating medications -Lactulose 30 g 3 times daily as tolerated -Check ammonia level in a.m.  Suicidal ideation, PTSD: Patient reports that he gets like this  every year around this time and has had thoughts of wanting to hurt himself.  -Sitter to bedside -On consult TTS once medically stable -Social work consult  Communicated fracture of the left fifth metacarpal secondary to: Secondary to fall several days ago per patient.  Splint previously offered but patient declined. -Reattempt to place splint  Prolonged QT interval: On admission initial QTc 523 from 10/11.  -Correct any electrolyte abnormality -Recheck EKG  Chronic kidney disease stage III: Patient baseline creatinine appears to range from 1.7 to 2.  Patient presents with creatinine elevated up to two-point 0.5 with BUN 38. -Repeat  creatinine in a.m.  Chronic pain: Patient home medications include oxycodone 10 mg daily. -Decrease dose of oxycodone IR from 10 mg down to 5 mg every 6 hours as needed for pain at this time due to lethargy  History of hypothyroidism: Currently not on medication for treatment. -Check TSH  History of hepatitis C: Previously reported to be treated in 2018 at [facility].  Hyperbilirubinemia: Total bilirubin mildly elevated at 1.7. -Continue to monitor  GERD DVT prophylaxis: SCDs Code Status: Full Family Communication: No family present at bedside Disposition Plan: To be determined Consults called: GI Admission status: Inpatient  Clydie Braunondell A Fizza Scales MD [facility] Pager [phone]   If 7PM-7AM, please contact night-coverage [email or web address] Password [email or web address]  01/08/2019, 10:55 AM

## 2019-01-08 NOTE — ED Notes (Signed)
Pt on and off bedpan, last two bedpans empty. Pt demanding medication for diarrhea, explained to pt no meds orders and there hasn't been any diarrhea present. Pt also denies nausea now

## 2019-01-08 NOTE — Consult Note (Addendum)
[facility] Consult: 5:34 PM 01/08/2019  LOS: 0 days    Referring Provider: Dr  Rosalia Hammers in ED Primary Care Physician:  Fleet Contras, MD Primary Gastroenterologist: Gentry Fitz.    Reason for Consultation: Hematemesis, melena.   HPI: [name] is a 58 y.o. male.  Hx Hepatitis C, cirrhosis.  Hepatitis C treated with Epclusa 2018.  HCV virus not detected in 08/08/2018.  Ascites.  2 liter Paracentesis 2012.  Anemia.  Thrombocytopenia.  Coagulopathy.  PTSD.  Schizoaffective disorder.  Gout.  Hypothyroidism.  Diabetes insipidus.  Hypertension. Chronic lower extremity edema No prior EGD or colonoscopies.  Pt refused endoscopic work-ups for iron deficiency anemia, hematemesis in 09/2017, 07/2018.  He left the hospital AMA in 09/2017 Latest GI imaging was a CTAP without contrast in 07/2018 showed cirrhosis, splenomegaly, upper abdominal and esophageal varices, moderate ascites.  Cholelithiasis. Noticed abdominal ultrasound 01/2018 firmed cirrhosis, cholelithiasis, trace perihepatic ascites.  Patient visits the ED very frequently.  Complaints include chronic pain, generalized pain, chest pain, suicidal ideation.  Repeated COVID tests are negative.  Seen in the ED today with nonspecific suicidal ideation, chronic chest pain. Back in the ED yesterday on psychiatric hold.   Agitation treated with Haldol, Ativan.  Pain treated with oxycodone.. Developed hematemesis this morning.  1 large episode hematemesis without abdominal pain and 1 episode of melena a few hours ago.  Currently denies nausea, denies abdominal pain but complains of pain in his bones and is asking for his oxycodone and asking for soda.  Hospitalist is admitting the patient.  IV octreotide and Protonix IV ordered. Hgb 8.2 today.  Was 8.8 two days ago.  7 -8 in  May.  10.6 in August, 9.6 in mid September. Head CT today shows atrophy.  Spoke with patient about upper endoscopy and he is currently agreeable. Patient lives alone.  He denies alcohol.  Denies aspirin and NSAIDs.  However listed on his medications from home is Mobic 15 mg daily.  Ibuprofen 800 mg every 8 as needed.  Not taking Protonix or other PPI or H2 blocker.  Past Medical History:  Diagnosis Date  . Anemia   . Ascites   . Chronic leg pain   . Cirrhosis (HCC)   . GERD (gastroesophageal reflux disease)   . H/O diabetes insipidus   . Hepatitis C    Completed therapy with Epclusa ~ 2018.  suspect therapy overseen by [facility] in [place], [place]  . History of adrenal insufficiency   . History of ARDS   . History of blood transfusion   . Hypothyroidism   . Kidney disease    stage 3  . Neuropathy   . Overdose 12/2009  . Peripheral neuropathy   . Personality disorder (HCC)   . PTSD (post-traumatic stress disorder)    SECONDARY TO WAR IN [place]  . Schizoaffective disorder [facility])     Past Surgical History:  Procedure Laterality Date  . CERVICAL SPINE SURGERY  unknown  . RIGHT HIP SURGERY  1996  . US GUIDED LEFT THORACENTESIS  01/21/2010  Prior to Admission medications   Medication Sig Start Date End Date Taking? Authorizing Provider  diphenhydrAMINE (BENADRYL) 25 MG tablet Take 25 mg by mouth at bedtime.    Yes [provider]  ibuprofen (ADVIL) 800 MG tablet Take 800 mg by mouth every 8 (eight) hours as needed for mild pain.   Yes [provider]  LORazepam (ATIVAN) 0.5 MG tablet Take 0.5 mg by mouth at bedtime.    Yes [provider]  [employer] D3 2000 units CAPS Take 2,000 Units by mouth daily. 10/23/17   [provider]  fluPHENAZine (PROLIXIN) 5 MG tablet Take 5 mg by mouth 3 (three) times daily.    [provider]  gabapentin (NEURONTIN) 100 MG capsule Take 200 mg by mouth 3 (three) times daily. 07/21/18   [provider]  meloxicam (MOBIC) 15 MG tablet Take 15 mg by mouth daily. 07/06/18   [provider]  Oxycodone HCl 10 MG TABS Take 10 mg by mouth every 8 (eight) hours. 10/18/18   [provider]  predniSONE (DELTASONE) 50 MG tablet Take 1 tablet (50 mg total) by mouth daily. 11/26/18   Lawyer, Christopher, PA-C  SENSIPAR 30 MG tablet Take 30 mg by mouth daily. 07/25/17   [provider]  trihexyphenidyl (ARTANE) 2 MG tablet Take 2 mg by mouth 3 (three) times daily.    [provider]  colchicine 0.6 MG tablet Take 1 tablet (0.6 mg total) by mouth daily. Patient not taking: Reported on 11/05/2018 08/26/18 12/31/18  Tanda RockersVenter, Margaux, PA-C  pantoprazole (PROTONIX) 40 MG tablet Take 1 tablet (40 mg total) by mouth 2 (two) times daily. Patient not taking: Reported on 10/01/2018 10/18/17 12/31/18  Simonne MartinetBabcock, Peter E, NP    Scheduled Meds: . lactulose  30 g Oral TID  . sodium chloride flush  3 mL Intravenous Q12H   Infusions: . sodium chloride 75 mL/hr at 01/08/19 1431  . octreotide  (SANDOSTATIN)    IV infusion 50 mcg/hr (01/08/19 1428)   PRN Meds: acetaminophen **OR** acetaminophen, albuterol, oxyCODONE   Allergies as of 01/07/2019  . (No Known Allergies)    Family History  Problem Relation Age of Onset  . Cancer Mother   . Heart attack Father     Social History   Social History Narrative   Single, Lives with mother   Emigrated from [place] during/after [place]   Smoker, no drugs, denies recent EtOH   Unemployed   Medicaid [place] Access     REVIEW OF SYSTEMS: Constitutional: No weakness, no fatigue ENT:  No nose bleeds Pulm: Denies shortness of breath or cough. CV:  No palpitations, no LE edema.  GU:  No hematuria, no frequency GI: Nice dysphagia.  Stable abdominal swelling.  No abdominal pain. Heme: Bruises easily.   Transfusions: Received 4 PRBC in 09/2017, PRBC in 07/2018 Neuro:  No headaches, no peripheral tingling or numbness  Derm:  No itching, no rash or sores.  Endocrine:  No sweats or chills.  No polyuria or dysuria Immunization: Not queried. Travel:  None beyond local counties in last few months.    PHYSICAL EXAM: Vital signs in last 24 hours: Vitals:   01/08/19 1545 01/08/19 1600  BP: 113/63 111/70  Pulse:    Resp: 17 17  Temp:    SpO2:     Wt Readings from Last 3 Encounters:  01/07/19 99.8 kg  01/06/19 99.8 kg  01/01/19 99.8 kg    General: Chronically ill looking.  Looks old  for age.  Pale.  Generally edematous. Head: There is a bruise to the right side of his eye but there is no associated excoriation or lesion.  Patient cannot tell me how it got there. Eyes: No scleral icterus.  No conjunctival pallor. Ears: Not hard of hearing Nose: No discharge or congestion Mouth: Tongue beefy red, oral mucosa moist, pink, clear.  Poor dentition.  Tongue midline. Neck: No JVD, no masses, no thyromegaly. Lungs: Nonlabored breathing.  No cough.  Lungs clear bilaterally Heart: RRR.  No MRG.  S1, S2 present. Abdomen: Soft, obese, non-protuberant.  Active bowel sounds.  Not tender.  Do not appreciate organomegaly, masses, bruits, hernias..   Rectal: Did not perform.  Nurse saw melena once a few hours ago. Musc/Skeltl: No joint redness or swelling. Extremities: Neurolysed edema/anasarca up into the thighs and hips.  Couple bruises on his legs and arms. Neurologic: Alert.  Oriented to self, place, year.  Moves all 4 limbs. Skin: No open sores Tattoos: Vague, pale, old tattoo on his left forearm. Nodes: No cervical adenopathy Psych: Oertli calm and cooperative.  Repeatedly asking for the nurse to give him oxycodone   LAB RESULTS: Recent Labs    01/06/19 1437 01/08/19 0846 01/08/19 1424  WBC 5.4 9.2  --   HGB 8.8* 8.2* 7.1*  HCT 28.8* 27.1* 24.3*  PLT 104* 164  --    BMET Lab Results  Component Value Date   NA 142 01/08/2019   NA 141 01/06/2019   NA 141 12/14/2018   K 4.6 01/08/2019   K 4.1  01/06/2019   K 3.7 12/14/2018   CL 114 (H) 01/08/2019   CL 112 (H) 01/06/2019   CL 107 12/14/2018   CO2 23 01/08/2019   CO2 21 (L) 01/06/2019   CO2 23 12/14/2018   GLUCOSE 103 (H) 01/08/2019   GLUCOSE 93 01/06/2019   GLUCOSE 91 12/14/2018   BUN 38 (H) 01/08/2019   BUN 27 (H) 01/06/2019   BUN 13 12/14/2018   CREATININE 2.15 (H) 01/08/2019   CREATININE 2.26 (H) 01/06/2019   CREATININE 1.94 (H) 12/14/2018   CALCIUM 8.6 (L) 01/08/2019   CALCIUM 8.6 (L) 01/06/2019   CALCIUM 8.9 12/14/2018   LFT Recent Labs    01/08/19 0846  PROT 4.9*  ALBUMIN 2.5*  AST 20  ALT 11  ALKPHOS 56  BILITOT 1.7*   PT/INR Lab Results  Component Value Date   INR 1.6 (H) 01/08/2019   INR 1.6 (H) 08/07/2018   INR 1.67 10/18/2017     RADIOLOGY STUDIES: Ct Head Wo Contrast  Result Date: 01/08/2019 CLINICAL DATA:  Altered level of consciousness, unexplained. EXAM: CT HEAD WITHOUT CONTRAST TECHNIQUE: Contiguous axial images were obtained from the base of the skull through the vertex without intravenous contrast. COMPARISON:  10/27/2010 FINDINGS: Brain: Generalized atrophy as before, no acute intracranial process, no evidence of mass, mass effect, midline shift or intracranial hemorrhage. Vascular: No hyperdense vessel or unexpected calcification. Skull: Signs of cervical-occipital fusion as before. No acute process Sinuses/Orbits: No acute finding. Other: None. IMPRESSION: 1. Generalized atrophy as before, no acute intracranial findings. Electronically Signed   By: Zetta Bills M.D.   On: 01/08/2019 10:31   Dg Chest Port 1 View  Result Date: 01/08/2019 CLINICAL DATA:  Hematemesis EXAM: PORTABLE CHEST 1 VIEW COMPARISON:  01/06/2019 FINDINGS: Cardiomegaly. No confluent opacities, effusions or edema. No acute bony abnormality. IMPRESSION: Cardiomegaly.  No active disease. Electronically Signed   By: Rolm Baptise M.D.   On:  01/08/2019 11:30      IMPRESSION:   *    Hematemesis in a patient with  known cirrhosis of the liver and esophageal/upper abdominal varices demonstrated on CT scan in May. Unclear whether or not he is taking NSAIDs, Mobic/Advil, he denies but I am not sure he knows everything that he is taking.   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  Treated with Epclusa 2018.  Undetectable virus 2019.    PLAN:     *    Set him up for EGD tomorrow.  Fully he will change his mind before tomorrow but today he is agreeable to endoscopy.  Continue the Protonix IV and octreotide.   Jennye MoccasinSarah Gribbin, PA-C  01/08/2019, 5:34 PM Phone [phone]      [facility] Attending   I have taken an interval history, reviewed the chart and examined the patient. I agree with the Advanced Practitioner's note, impression and recommendations.    Iva Booparl E. Presli Fanguy, MD, [facility] 01/08/2019 5:35 PM Pager [phone]

## 2019-01-08 NOTE — Progress Notes (Addendum)
01/08/2019 532 pm TOC CM chart review, 22 ED visits in past six months and 1 IP visit. Contacted pt. Pt states he lives alone and uses Medicaid transportation to get to his appt. Pt states he needs assistance at home. Will fax [employer] aide paperwork to his PCP's office to assist pt with getting an aide to assist him in the home. Pt can afford medications. Has appt with pain specialist on 01/09/2019. Had appt with GI 01/08/2019. [place], [facility] TOC CM [phone]

## 2019-01-09 DIAGNOSIS — D62 Acute posthemorrhagic anemia: Secondary | ICD-10-CM | POA: Insufficient documentation

## 2019-01-09 LAB — COMPREHENSIVE METABOLIC PANEL
ALT: 11 U/L (ref 0–44)
AST: 19 U/L (ref 15–41)
Albumin: 2.2 g/dL — ABNORMAL LOW (ref 3.5–5.0)
Alkaline Phosphatase: 43 U/L (ref 38–126)
Anion gap: 6 (ref 5–15)
BUN: 49 mg/dL — ABNORMAL HIGH (ref 6–20)
CO2: 21 mmol/L — ABNORMAL LOW (ref 22–32)
Calcium: 8.5 mg/dL — ABNORMAL LOW (ref 8.9–10.3)
Chloride: 115 mmol/L — ABNORMAL HIGH (ref 98–111)
Creatinine, Ser: 2.02 mg/dL — ABNORMAL HIGH (ref 0.61–1.24)
GFR calc Af Amer: 41 mL/min — ABNORMAL LOW (ref >60)
GFR calc non Af Amer: 35 mL/min — ABNORMAL LOW (ref >60)
Glucose, Bld: 92 mg/dL (ref 70–99)
Potassium: 4.8 mmol/L (ref 3.5–5.1)
Sodium: 142 mmol/L (ref 135–145)
Total Bilirubin: 1.6 mg/dL — ABNORMAL HIGH (ref 0.3–1.2)
Total Protein: 4.2 g/dL — ABNORMAL LOW (ref 6.5–8.1)

## 2019-01-09 LAB — PROTIME-INR
INR: 1.4 — ABNORMAL HIGH (ref 0.8–1.2)
Prothrombin Time: 17.3 seconds — ABNORMAL HIGH (ref 11.4–15.2)

## 2019-01-09 LAB — CBC
HCT: 17.8 % — ABNORMAL LOW (ref 39.0–52.0)
Hemoglobin: 5.6 g/dL — CL (ref 13.0–17.0)
MCH: 30.9 pg (ref 26.0–34.0)
MCHC: 31.5 g/dL (ref 30.0–36.0)
MCV: 98.3 fL (ref 80.0–100.0)
Platelets: 88 10*3/uL — ABNORMAL LOW (ref 150–400)
RBC: 1.81 MIL/uL — ABNORMAL LOW (ref 4.22–5.81)
RDW: 18.5 % — ABNORMAL HIGH (ref 11.5–15.5)
WBC: 5.5 10*3/uL (ref 4.0–10.5)
nRBC: 0 % (ref 0.0–0.2)

## 2019-01-09 LAB — HEMOGLOBIN AND HEMATOCRIT, BLOOD
HCT: 21.1 % — ABNORMAL LOW (ref 39.0–52.0)
HCT: 23.6 % — ABNORMAL LOW (ref 39.0–52.0)
Hemoglobin: 6.8 g/dL — CL (ref 13.0–17.0)
Hemoglobin: 7.5 g/dL — ABNORMAL LOW (ref 13.0–17.0)

## 2019-01-09 LAB — PREPARE RBC (CROSSMATCH)

## 2019-01-09 LAB — AMMONIA: Ammonia: 19 umol/L (ref 9–35)

## 2019-01-09 MED ORDER — HALOPERIDOL LACTATE 5 MG/ML IJ SOLN
3.0000 mg | Freq: Once | INTRAMUSCULAR | Status: AC
  Administered 2019-01-09: 3 mg via INTRAVENOUS
  Filled 2019-01-09: qty 1

## 2019-01-09 MED ORDER — HALOPERIDOL LACTATE 5 MG/ML IJ SOLN
INTRAMUSCULAR | Status: AC
  Filled 2019-01-09: qty 1

## 2019-01-09 MED ORDER — TRIHEXYPHENIDYL HCL 2 MG PO TABS
2.0000 mg | ORAL_TABLET | Freq: Three times a day (TID) | ORAL | Status: DC
  Administered 2019-01-09 – 2019-01-11 (×7): 2 mg via ORAL
  Filled 2019-01-09 (×8): qty 1

## 2019-01-09 MED ORDER — PHYTONADIONE 5 MG PO TABS
5.0000 mg | ORAL_TABLET | Freq: Every day | ORAL | Status: AC
  Administered 2019-01-09 – 2019-01-11 (×3): 5 mg via ORAL
  Filled 2019-01-09 (×3): qty 1

## 2019-01-09 MED ORDER — HALOPERIDOL LACTATE 5 MG/ML IJ SOLN
5.0000 mg | Freq: Four times a day (QID) | INTRAMUSCULAR | Status: DC | PRN
  Administered 2019-01-09 – 2019-01-10 (×3): 5 mg via INTRAVENOUS
  Filled 2019-01-09 (×3): qty 1

## 2019-01-09 MED ORDER — SODIUM CHLORIDE 0.9% IV SOLUTION: Freq: Once | INTRAVENOUS | Status: DC

## 2019-01-09 MED ORDER — OXYCODONE HCL 5 MG PO TABS
5.0000 mg | ORAL_TABLET | Freq: Four times a day (QID) | ORAL | Status: DC | PRN
  Administered 2019-01-09 – 2019-01-11 (×6): 5 mg via ORAL
  Filled 2019-01-09 (×6): qty 1

## 2019-01-09 MED ORDER — LORAZEPAM 2 MG/ML IJ SOLN
1.0000 mg | INTRAMUSCULAR | Status: AC | PRN
  Administered 2019-01-09 – 2019-01-10 (×4): 1 mg via INTRAVENOUS
  Filled 2019-01-09 (×6): qty 1

## 2019-01-09 MED ORDER — MORPHINE SULFATE (PF) 2 MG/ML IV SOLN
1.0000 mg | INTRAVENOUS | Status: AC | PRN
  Administered 2019-01-09 – 2019-01-10 (×2): 1 mg via INTRAVENOUS
  Filled 2019-01-09 (×2): qty 1

## 2019-01-09 MED ORDER — SODIUM CHLORIDE 0.9% IV SOLUTION
Freq: Once | INTRAVENOUS | Status: AC
  Administered 2019-01-09: 14:00:00 via INTRAVENOUS

## 2019-01-09 NOTE — ED Notes (Signed)
Pt restful and flaccid UE movements at this time.  Bilateral mittens applied and secured with 2 in ace wraps.  Pt does not wake for procedure.  IV team here as well for US guided IV.  Sitter present.

## 2019-01-09 NOTE — ED Notes (Signed)
No s/sx of infusion reaction.  Pt wakes easily for temp and returns to rest.

## 2019-01-09 NOTE — Progress Notes (Signed)
RN paged NP around beginning of shift stating pt had 3 small black liquid stools and was nauseated. Per RN, pt has not had any emesis. Nausea medicine ordered. Per RN, he has only had "gas" since the above BMs and is not actively bleeding now.  At that time, pt had just finished a unit of blood for a Hgb of 7.1 earlier. Asked RN to time the next CBC for 1.5 hours after blood finishes. VSS at that time. Later, RN paged that pt's Hgb after the unit of blood is 5.6. Per RN, pt has not had any noted bleeding since he arrived. Chart reviewed and noted low Hgb and INR of 1.6. NP ordered 2 U FFP and 3 U of PRBCs with CBC afterwards. Also, serial H/H every 6 hours, but this will not start being drawn until these blood products are finished. Pt is not tachycardic and has a BP in the one teens. Pt is not on any blood thinners. GI has consulted and plan for EGD in am. Pt has a hx of refusing workups for GIB, but agreed to this when GI saw.  Start SCDs for VTE.  Will follow.  KJKG, NP [facility]

## 2019-01-09 NOTE — Progress Notes (Addendum)
Daily Rounding Note  01/09/2019, 12:47 PM  LOS: 1 day   SUBJECTIVE: Hematemesis, melena.  Cirrhosis.  Hepatitis C Chief complaint:     Staffing notes confirm 3 small liquid stools, nausea but no emesis as of this afternoon.  She denies abdominal pain, nausea.  He is hungry and asking for food Received 2 PRBCs thus far. Around 530 this morning patient pulled out his 3 existing IVs, therefore EGD canceled for today.  Has 1 IV in which is again running octreotide, the second about to be placed.  His left wrist/forearm are bandaged.  X-ray yesterday evening confirmed fifth metacarpal fracture.  He sustained this after punching a wall prior to arrival   OBJECTIVE:         Vital signs in last 24 hours:    Temp:  [98.7 F (37.1 C)-99.3 F (37.4 C)] 99 F (37.2 C) (10/13 0626) Pulse Rate:  [57-83] 69 (10/13 0627) Resp:  [7-23] 19 (10/13 0626) BP: (92-148)/(54-94) 92/54 (10/13 0627) SpO2:  [95 %-100 %] 97 % (10/13 0627)   Filed Weights   01/07/19 1200  Weight: 99.8 kg   General: Fairly calm today.  Looks chronically ill and still looks pale Heart: RRR. Chest: Clear bilaterally.  No labored breathing or cough Abdomen: Not tender.  Soft.  Large but not protuberant. Extremities: Bandaged left forearm/wrist.  Bruising on limbs. Neuro/Psych: Follows commands.  Some inappropriate/non sequitur/off-the-wall speech.  Moves all 4 limbs.  No tremors   Lab Results: Recent Labs    01/06/19 1437 01/08/19 0846 01/08/19 1424 01/09/19 0051 01/09/19 0909  WBC 5.4 9.2  --  5.5  --   HGB 8.8* 8.2* 7.1* 5.6* 6.8*  HCT 28.8* 27.1* 24.3* 17.8* 21.1*  PLT 104* 164  --  88*  --    BMET Recent Labs    01/06/19 1437 01/08/19 0846 01/09/19 0300  NA 141 142 142  K 4.1 4.6 4.8  CL 112* 114* 115*  CO2 21* 23 21*  GLUCOSE 93 103* 92  BUN 27* 38* 49*  CREATININE 2.26* 2.15* 2.02*  CALCIUM 8.6* 8.6* 8.5*   LFT Recent Labs   01/08/19 0846 01/09/19 0300  PROT 4.9* 4.2*  ALBUMIN 2.5* 2.2*  AST 20 19  ALT 11 11  ALKPHOS 56 43  BILITOT 1.7* 1.6*   PT/INR Recent Labs    01/08/19 1424 01/09/19 0909  LABPROT 18.4* 17.3*  INR 1.6* 1.4*      ASSESMENT:   *   Hematemesis. Esophageal/upper abdominal varices per CT in 07/2018.  Up until yesterday had refused all offers and suggestions to undergo upper endoscopy.  Yesterday agreeable to EGD.  This is now canceled because he became agitated and removed his IVs. Protonix drip and octreotide were running through these IVs, both currently on hold.  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  FFP x1 at 530 this morning.  INR 1.6 >> 1.4  *     AKI  *    Resolved elevated ammonia level.  Large dose, tid lactulose in place. No overt encephalopathy.  His behavior and mental status are chronically bizarre, I am not sure hepatic encephalopathy is present or contributing to his baseline psychiatric issues.   PLAN   *   EGD set for 1230  tomorrow.   Low clear liquid diet through 5 AM tomorrow.  *    Titrate lactulose for 2-3 bowel movements a day.  *    Continue the octreotide drip and the twice daily, IV Protonix  *   Oral vitamin K for 3 days    Azucena Freed  01/09/2019, 12:47 PM Phone [phone]     [place] Attending   I have taken an interval history, reviewed the chart and examined the patient. I agree with the Advanced Practitioner's note, impression and recommendations.   He is wanting food  No vomiting and we are catching up with anemia  Sig psych issues in play so I will let him eat to see if that alleviates some of the psych and behavioral issues.  NPO in AM for hopeful EGD tomorrow that will need MAC.  Gatha Mayer, MD, [facility] 01/09/2019 4:40 PM Pager [phone]

## 2019-01-09 NOTE — ED Notes (Signed)
Patient continues to bang head against siderail and shout/scream for assistance. Sitter bedside and pt still continues to shout out. Pt has become so agitated he pulled his IV out which was receiving blood.

## 2019-01-09 NOTE — Progress Notes (Signed)
PROGRESS NOTE    [name]  OIN:[id number] DOB: [birth year] DOA: 01/07/2019 PCP: Nolene Ebbs, MD     Brief Narrative:  [name] is a 58 y.o. male with medical history significant of cirrhosis with splenomegaly and esophageal varices, hepatitis C previous treated, adrenal insufficiency, hypothyroidism, schizoaffective disorder, PTSD, coagulopathy prior overdose, GI bleed, and GERD; who presented yesterday with complaints of suicidal ideation.  Patient notes that every year around this time he has thoughts of wanting to hurt himself, but did not go into further details.  He reports falling a couple days ago onto his left hand, but denies any loss of consciousness or trauma to his head.  At baseline patient has chronic pain in addition to pain in his left hand, and had recently ran out of his pain medications.  Currently denies having any chest pain, shortness of breath, fever, chills, nausea, or abdominal pain at this time.  Unclear if the patient has been taking NSAIDs, but appear on his medication list.  Patient has had several emergency department visits in the last 2 weeks.  Last admitted into the hospital back in May where patient was noted to have a GI bleed requiring transfusion of blood products but ultimately refused EGD and left AGAINST MEDICAL ADVICE. On admission, he was noted to have at least one episode of vomiting with blood present  New events last 24 hours / Subjective: Patient had episode of agitation, combativeness early this morning.  He had pulled out his IV mid-blood transfusion.  During my examination, patient remains calm, alert but not engaging in any conversation or attempt to answer any questions.  Sitter is at bedside.  Assessment & Plan:   Principal Problem:   Hematemesis Active Problems:   Normocytic anemia   GERD (gastroesophageal reflux disease)   Prolonged QT interval   Esophageal varices in cirrhosis (HCC)   CKD (chronic kidney disease) stage 3, GFR 30-59  ml/min   Suicidal ideation   Closed displaced fracture of neck of left fifth metacarpal bone   Hematemesis, normocytic anemia, history of esophageal varices -Patient with previous history of GI bleed May 2020, but refused EGD at that time.  CT scan of the abdomen pelvis in May noted esophageal varices -GI consulted  -Octreotide drip -Protonix drip  Blood loss anemia -Transfused 2 unit packed red blood cell so far -Transfused 1 unit FFP so far -Trend H&H -Ordered additional blood products this morning  Acute hepatic encephalopathy, hepatic cirrhosis -On admission ammonia level elevated at 113 -Lactulose 30 g 3 times daily as tolerated  Suicidal ideation, PTSD -Patient reports that he gets like this every year around this time and has had thoughts of wanting to hurt himself.  -Sitter at bedside -Will need psych evaluation once medically stable -Ativan and Haldol as needed for agitation  Communicated fracture of the left fifth metacarpal -Splint placed in the emergency department  Prolonged QT interval -On admission initial QTc 523 from 10/11 -Correct any electrolyte abnormality -Monitor closely  Chronic kidney disease stage III -Baseline creatinine 1.7-2 -Stable  Chronic pain -Continue home oxycodone  History of hypothyroidism -Currently not on medication for treatment -TSH normal at 0.707  History of hepatitis  -Previously reported to be treated  with Epclusa in 2018 at [facility]     DVT prophylaxis: SCD Code Status: Full Family Communication: None Disposition Plan: Pending GI evaluation. Will need psych eval once medically stable   Consultants:   GI  Procedures:   None   Antimicrobials:  Anti-infectives (From admission, onward)   None        Objective: Vitals:   01/09/19 0430 01/09/19 0500 01/09/19 0626 01/09/19 0627  BP: 134/61 (!) 113/94 (!) 92/54 (!) 92/54  Pulse: 69 (!) 57 62 69  Resp: 18 (!) 22 19   Temp:   99 F (37.2  C)   TempSrc:   Axillary   SpO2: 97% 97% 97% 97%  Weight:      Height:        Intake/Output Summary (Last 24 hours) at 01/09/2019 1203 Last data filed at 01/09/2019 0550 Gross per 24 hour  Intake 752 ml  Output 1 ml  Net 751 ml   Filed Weights   01/07/19 1200  Weight: 99.8 kg    Examination:  General exam: Appears calm and comfortable  Respiratory system: Clear to auscultation. Respiratory effort normal. No respiratory distress. No conversational dyspnea.  Cardiovascular system: S1 & S2 heard, RRR. No murmurs. No pedal edema. Gastrointestinal system: Abdomen is nondistended, soft and nontender. Normal bowel sounds heard. Central nervous system: Alert to voice Skin: No rashes, lesions or ulcers on exposed skin  Psychiatry: Unable to judge due to patient not cooperating with examination or questioning   Data Reviewed: I have personally reviewed following labs and imaging studies  CBC: Recent Labs  Lab 01/06/19 1437 01/08/19 0846 01/08/19 1424 01/09/19 0051 01/09/19 0909  WBC 5.4 9.2  --  5.5  --   NEUTROABS  --  7.0  --   --   --   HGB 8.8* 8.2* 7.1* 5.6* 6.8*  HCT 28.8* 27.1* 24.3* 17.8* 21.1*  MCV 97.6 99.3  --  98.3  --   PLT 104* 164  --  88*  --    Basic Metabolic Panel: Recent Labs  Lab 01/06/19 1437 01/08/19 0846 01/09/19 0300  NA 141 142 142  K 4.1 4.6 4.8  CL 112* 114* 115*  CO2 21* 23 21*  GLUCOSE 93 103* 92  BUN 27* 38* 49*  CREATININE 2.26* 2.15* 2.02*  CALCIUM 8.6* 8.6* 8.5*   GFR: Estimated Creatinine Clearance: 51.5 mL/min (A) (by C-G formula based on SCr of 2.02 mg/dL (H)). Liver Function Tests: Recent Labs  Lab 01/08/19 0846 01/09/19 0300  AST 20 19  ALT 11 11  ALKPHOS 56 43  BILITOT 1.7* 1.6*  PROT 4.9* 4.2*  ALBUMIN 2.5* 2.2*   No results for input(s): LIPASE, AMYLASE in the last 168 hours. Recent Labs  Lab 01/08/19 0846 01/09/19 0300  AMMONIA 113* 19   Coagulation Profile: Recent Labs  Lab 01/08/19 1424 01/09/19  0909  INR 1.6* 1.4*   Cardiac Enzymes: Recent Labs  Lab 01/08/19 0846  CKTOTAL 107   BNP (last 3 results) No results for input(s): PROBNP in the last 8760 hours. HbA1C: No results for input(s): HGBA1C in the last 72 hours. CBG: No results for input(s): GLUCAP in the last 168 hours. Lipid Profile: No results for input(s): CHOL, HDL, LDLCALC, TRIG, CHOLHDL, LDLDIRECT in the last 72 hours. Thyroid Function Tests: Recent Labs    01/08/19 1424  TSH 0.707   Anemia Panel: No results for input(s): VITAMINB12, FOLATE, FERRITIN, TIBC, IRON, RETICCTPCT in the last 72 hours. Sepsis Labs: No results for input(s): PROCALCITON, LATICACIDVEN in the last 168 hours.  Recent Results (from the past 240 hour(s))  SARS CORONAVIRUS 2 (TAT 6-24 HRS) Nasopharyngeal Nasopharyngeal Swab     Status: None   Collection Time: 01/06/19  2:43 PM   Specimen: Nasopharyngeal Swab  Result Value Ref Range Status   SARS Coronavirus 2 NEGATIVE NEGATIVE Final    Comment: (NOTE) SARS-CoV-2 target nucleic acids are NOT DETECTED. The SARS-CoV-2 RNA is generally detectable in upper and lower respiratory specimens during the acute phase of infection. Negative results do not preclude SARS-CoV-2 infection, do not rule out co-infections with other pathogens, and should not be used as the sole basis for treatment or other patient management decisions. Negative results must be combined with clinical observations, patient history, and epidemiological information. The expected result is Negative. Fact Sheet for Patients: HairSlick.no Fact Sheet for Healthcare Providers: quierodirigir.com This test is not yet approved or cleared by the [place] FDA and  has been authorized for detection and/or diagnosis of SARS-CoV-2 by FDA under an Emergency Use Authorization (EUA). This EUA will remain  in effect (meaning this test can be used) for the duration of the  COVID-19 declaration under Section 56 4(b)(1) of the Act, 21 U.S.C. section 360bbb-3(b)(1), unless the authorization is terminated or revoked sooner. Performed at [facility], [address] [place], [place] [address]   SARS CORONAVIRUS 2 (TAT 6-24 HRS) Nasopharyngeal Nasopharyngeal Swab     Status: None   Collection Time: 01/08/19 10:37 AM   Specimen: Nasopharyngeal Swab  Result Value Ref Range Status   SARS Coronavirus 2 NEGATIVE NEGATIVE Final    Comment: (NOTE) SARS-CoV-2 target nucleic acids are NOT DETECTED. The SARS-CoV-2 RNA is generally detectable in upper and lower respiratory specimens during the acute phase of infection. Negative results do not preclude SARS-CoV-2 infection, do not rule out co-infections with other pathogens, and should not be used as the sole basis for treatment or other patient management decisions. Negative results must be combined with clinical observations, patient history, and epidemiological information. The expected result is Negative. Fact Sheet for Patients: HairSlick.no Fact Sheet for Healthcare Providers: quierodirigir.com This test is not yet approved or cleared by the [place] FDA and  has been authorized for detection and/or diagnosis of SARS-CoV-2 by FDA under an Emergency Use Authorization (EUA). This EUA will remain  in effect (meaning this test can be used) for the duration of the COVID-19 declaration under Section 56 4(b)(1) of the Act, 21 U.S.C. section 360bbb-3(b)(1), unless the authorization is terminated or revoked sooner. Performed at [facility], [address] [place], [place] [address]       Radiology Studies: Ct Head Wo Contrast  Result Date: 01/08/2019 CLINICAL DATA:  Altered level of consciousness, unexplained. EXAM: CT HEAD WITHOUT CONTRAST TECHNIQUE: Contiguous axial images were obtained from the base of the skull through the vertex  without intravenous contrast. COMPARISON:  10/27/2010 FINDINGS: Brain: Generalized atrophy as before, no acute intracranial process, no evidence of mass, mass effect, midline shift or intracranial hemorrhage. Vascular: No hyperdense vessel or unexpected calcification. Skull: Signs of cervical-occipital fusion as before. No acute process Sinuses/Orbits: No acute finding. Other: None. IMPRESSION: 1. Generalized atrophy as before, no acute intracranial findings. Electronically Signed   By: Donzetta Kohut M.D.   On: 01/08/2019 10:31   Dg Chest Port 1 View  Result Date: 01/08/2019 CLINICAL DATA:  Hematemesis EXAM: PORTABLE CHEST 1 VIEW COMPARISON:  01/06/2019 FINDINGS: Cardiomegaly. No confluent opacities, effusions or edema. No acute bony abnormality. IMPRESSION: Cardiomegaly.  No active disease. Electronically Signed   By: Charlett Nose M.D.   On: 01/08/2019 11:30      Scheduled Meds: . sodium chloride   Intravenous Once  . sodium chloride   Intravenous  Once  . haloperidol lactate      . lactulose  30 g Oral TID  . pantoprazole (PROTONIX) IV  40 mg Intravenous Q12H  . sodium chloride flush  3 mL Intravenous Q12H   Continuous Infusions: . sodium chloride 75 mL/hr at 01/08/19 1431  . octreotide  (SANDOSTATIN)    IV infusion 50 mcg/hr (01/09/19 0954)     LOS: 1 day      Time spent: 40 minutes   Noralee StainJennifer Rahcel Shutes, DO [facility] 01/09/2019, 12:03 PM   Available via Epic secure chat 7am-7pm After these hours, please refer to coverage provider listed on [email or web address]

## 2019-01-09 NOTE — ED Notes (Signed)
Pt continues to have repetitive speech about eating and foods.  Intermittently drifts off to sleep.

## 2019-01-09 NOTE — ED Notes (Signed)
Condom cath placed on pt 

## 2019-01-09 NOTE — ED Notes (Signed)
Pt pulled out all 3 IV's interrupting his therapies. RN currently not able to finish blood, FFP, and Sandostatin administration.

## 2019-01-09 NOTE — Progress Notes (Signed)
Orthopedic Tech Progress Note Patient Details:  [name] 07/28/1960 [id number]  Ortho Devices Type of Ortho Device: Short arm splint Ortho Device/Splint Location: left Ortho Device/Splint Interventions: Application   Post Interventions Patient Tolerated: Well Instructions Provided: Care of device   [name] 01/09/2019, 8:17 AM

## 2019-01-09 NOTE — Progress Notes (Signed)
    Events of this AM noted  That means EGD on hold until IV's in, blood running, etc   Will f/u later   Gatha Mayer, MD, [facility] 01/09/2019 7:24 AM Pager [phone]

## 2019-01-09 NOTE — H&P (View-Only) (Signed)
Daily Rounding Note  01/09/2019, 12:47 PM  LOS: 1 day   SUBJECTIVE: Hematemesis, melena.  Cirrhosis.  Hepatitis C Chief complaint:     Staffing notes confirm 3 small liquid stools, nausea but no emesis as of this afternoon.  She denies abdominal pain, nausea.  He is hungry and asking for food Received 2 PRBCs thus far. Around 530 this morning patient pulled out his 3 existing IVs, therefore EGD canceled for today.  Has 1 IV in which is again running octreotide, the second about to be placed.  His left wrist/forearm are bandaged.  X-ray yesterday evening confirmed fifth metacarpal fracture.  He sustained this after punching a wall prior to arrival   OBJECTIVE:         Vital signs in last 24 hours:    Temp:  [98.7 F (37.1 C)-99.3 F (37.4 C)] 99 F (37.2 C) (10/13 0626) Pulse Rate:  [57-83] 69 (10/13 0627) Resp:  [7-23] 19 (10/13 0626) BP: (92-148)/(54-94) 92/54 (10/13 0627) SpO2:  [95 %-100 %] 97 % (10/13 0627)   Filed Weights   01/07/19 1200  Weight: 99.8 kg   General: Fairly calm today.  Looks chronically ill and still looks pale Heart: RRR. Chest: Clear bilaterally.  No labored breathing or cough Abdomen: Not tender.  Soft.  Large but not protuberant. Extremities: Bandaged left forearm/wrist.  Bruising on limbs. Neuro/Psych: Follows commands.  Some inappropriate/non sequitur/off-the-wall speech.  Moves all 4 limbs.  No tremors   Lab Results: Recent Labs    01/06/19 1437 01/08/19 0846 01/08/19 1424 01/09/19 0051 01/09/19 0909  WBC 5.4 9.2  --  5.5  --   HGB 8.8* 8.2* 7.1* 5.6* 6.8*  HCT 28.8* 27.1* 24.3* 17.8* 21.1*  PLT 104* 164  --  88*  --    BMET Recent Labs    01/06/19 1437 01/08/19 0846 01/09/19 0300  NA 141 142 142  K 4.1 4.6 4.8  CL 112* 114* 115*  CO2 21* 23 21*  GLUCOSE 93 103* 92  BUN 27* 38* 49*  CREATININE 2.26* 2.15* 2.02*  CALCIUM 8.6* 8.6* 8.5*   LFT Recent Labs   01/08/19 0846 01/09/19 0300  PROT 4.9* 4.2*  ALBUMIN 2.5* 2.2*  AST 20 19  ALT 11 11  ALKPHOS 56 43  BILITOT 1.7* 1.6*   PT/INR Recent Labs    01/08/19 1424 01/09/19 0909  LABPROT 18.4* 17.3*  INR 1.6* 1.4*      ASSESMENT:   *   Hematemesis. Esophageal/upper abdominal varices per CT in 07/2018.  Up until yesterday had refused all offers and suggestions to undergo upper endoscopy.  Yesterday agreeable to EGD.  This is now canceled because he became agitated and removed his IVs. Protonix drip and octreotide were running through these IVs, both currently on hold.  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  FFP x1 at 530 this morning.  INR 1.6 >> 1.4  *     AKI  *    Resolved elevated ammonia level.  Large dose, tid lactulose in place. No overt encephalopathy.  His behavior and mental status are chronically bizarre, I am not sure hepatic encephalopathy is present or contributing to his baseline psychiatric issues.   PLAN   *   EGD set for 1230  tomorrow.   Low clear liquid diet through 5 AM tomorrow.  *    Titrate lactulose for 2-3 bowel movements a day.  *    Continue the octreotide drip and the twice daily, IV Protonix  *   Oral vitamin K for 3 days    Azucena Freed  01/09/2019, 12:47 PM Phone [phone]     [place] Attending   I have taken an interval history, reviewed the chart and examined the patient. I agree with the Advanced Practitioner's note, impression and recommendations.   He is wanting food  No vomiting and we are catching up with anemia  Sig psych issues in play so I will let him eat to see if that alleviates some of the psych and behavioral issues.  NPO in AM for hopeful EGD tomorrow that will need MAC.  Gatha Mayer, MD, [facility] 01/09/2019 4:40 PM Pager [phone]

## 2019-01-09 NOTE — ED Notes (Signed)
Pt more calm and able to follow instructions.  MD here for re eval and orders changed for increased diet.  Sandwich offered with additional fluids.

## 2019-01-10 ENCOUNTER — Inpatient Hospital Stay (HOSPITAL_COMMUNITY): Payer: Medicaid Other | Admitting: Certified Registered Nurse Anesthetist

## 2019-01-10 ENCOUNTER — Encounter (HOSPITAL_COMMUNITY): Payer: Self-pay

## 2019-01-10 ENCOUNTER — Encounter (HOSPITAL_COMMUNITY)
Admission: EM | Disposition: A | Discharge status: Home / Self Care | Payer: Self-pay | Source: Home / Self Care | Attending: Internal Medicine

## 2019-01-10 DIAGNOSIS — K209 Esophagitis, unspecified without bleeding: Secondary | ICD-10-CM

## 2019-01-10 DIAGNOSIS — K269 Duodenal ulcer, unspecified as acute or chronic, without hemorrhage or perforation: Secondary | ICD-10-CM

## 2019-01-10 DIAGNOSIS — K253 Acute gastric ulcer without hemorrhage or perforation: Secondary | ICD-10-CM

## 2019-01-10 HISTORY — PX: BIOPSY: SHX5522

## 2019-01-10 HISTORY — PX: ESOPHAGOGASTRODUODENOSCOPY (EGD) WITH PROPOFOL: SHX5813

## 2019-01-10 LAB — CBC
HCT: 21.2 % — ABNORMAL LOW (ref 39.0–52.0)
Hemoglobin: 6.9 g/dL — CL (ref 13.0–17.0)
MCH: 30.9 pg (ref 26.0–34.0)
MCHC: 32.5 g/dL (ref 30.0–36.0)
MCV: 95.1 fL (ref 80.0–100.0)
Platelets: 83 10*3/uL — ABNORMAL LOW (ref 150–400)
RBC: 2.23 MIL/uL — ABNORMAL LOW (ref 4.22–5.81)
RDW: 18 % — ABNORMAL HIGH (ref 11.5–15.5)
WBC: 3 10*3/uL — ABNORMAL LOW (ref 4.0–10.5)
nRBC: 0 % (ref 0.0–0.2)

## 2019-01-10 LAB — BASIC METABOLIC PANEL
Anion gap: 9 (ref 5–15)
BUN: 41 mg/dL — ABNORMAL HIGH (ref 6–20)
CO2: 17 mmol/L — ABNORMAL LOW (ref 22–32)
Calcium: 8.6 mg/dL — ABNORMAL LOW (ref 8.9–10.3)
Chloride: 118 mmol/L — ABNORMAL HIGH (ref 98–111)
Creatinine, Ser: 1.87 mg/dL — ABNORMAL HIGH (ref 0.61–1.24)
GFR calc Af Amer: 45 mL/min — ABNORMAL LOW (ref >60)
GFR calc non Af Amer: 39 mL/min — ABNORMAL LOW (ref >60)
Glucose, Bld: 95 mg/dL (ref 70–99)
Potassium: 4.1 mmol/L (ref 3.5–5.1)
Sodium: 144 mmol/L (ref 135–145)

## 2019-01-10 LAB — PREPARE RBC (CROSSMATCH)

## 2019-01-10 LAB — HEMOGLOBIN AND HEMATOCRIT, BLOOD
HCT: 23 % — ABNORMAL LOW (ref 39.0–52.0)
Hemoglobin: 7.4 g/dL — ABNORMAL LOW (ref 13.0–17.0)

## 2019-01-10 SURGERY — ESOPHAGOGASTRODUODENOSCOPY (EGD) WITH PROPOFOL
Anesthesia: Monitor Anesthesia Care

## 2019-01-10 MED ORDER — PROPOFOL 500 MG/50ML IV EMUL
INTRAVENOUS | Status: DC | PRN
  Administered 2019-01-10: 100 ug/kg/min via INTRAVENOUS

## 2019-01-10 MED ORDER — SODIUM CHLORIDE 0.9% IV SOLUTION
Freq: Once | INTRAVENOUS | Status: AC
  Administered 2019-01-10: 05:00:00 via INTRAVENOUS

## 2019-01-10 MED ORDER — MORPHINE SULFATE (PF) 2 MG/ML IV SOLN
1.0000 mg | INTRAVENOUS | Status: AC | PRN
  Administered 2019-01-10 (×2): 1 mg via INTRAVENOUS
  Filled 2019-01-10 (×2): qty 1

## 2019-01-10 MED ORDER — LACTATED RINGERS IV SOLN
INTRAVENOUS | Status: DC | PRN
  Administered 2019-01-10: 12:00:00 via INTRAVENOUS

## 2019-01-10 MED ORDER — PANTOPRAZOLE SODIUM 40 MG PO TBEC
40.0000 mg | DELAYED_RELEASE_TABLET | Freq: Two times a day (BID) | ORAL | Status: DC
  Administered 2019-01-10 – 2019-01-11 (×4): 40 mg via ORAL
  Filled 2019-01-10 (×4): qty 1

## 2019-01-10 SURGICAL SUPPLY — 15 items

## 2019-01-10 NOTE — Progress Notes (Addendum)
[name] was admitted to 5w14 from the ED.  The patient is alert and oriented x4, but is very anxious and repeatedly asking and begging for a cup of coffee, despite repeated explanation to the patient that his diet order is currently NPO due to concerns for a GI bleed.  The patient was tearful at one point over not being able to have a cup of coffee.  Due to this intense anxiety and inability to redirect the patient's attention, I did not attempt to complete the admission history at this time.  Suicide precautions in place per order, sitter present at the bedside.  Bed is in the lowest position.  Admission booklet left at the bedside.  Patient complaining of 10/10 pain "in my bones," PRN morphine 1mg  given.  After administration of morphine, patient calmed down and went to sleep.    Addendum:  Upon arrival to the floor, the patient's left upper extremity was noted to by ecchymotic and edematous, particularly around the hand.  What appeared to be a splint for the left upper extremity had been unwrapped and was laying in the bed with the patient.  Assessment of the left hand revealed cap refill <2 seconds, cool to the touch, radial pulse +2.  Patient states "my hand is not broken" when questioned about the apparent injuries.  Of note, the patient also had what felt like a hard mass over the lateral aspect of the left upper arm.

## 2019-01-10 NOTE — Interval H&P Note (Signed)
History and Physical Interval Note: For EGD with monitored anesthesia care to evaluate hematemesis in the setting of hepatitis C cirrhosis with portal hypertension. On PPI and octreotide infusion Hemoglobin stable at 7.4, INR last 1.4 yesterday HIGHER THAN BASELINE RISK.The nature of the procedure, as well as the risks, benefits, and alternatives were carefully and thoroughly reviewed with the patient. Ample time for discussion and questions allowed. The patient understood, was satisfied, and agreed to proceed.      01/10/2019 12:25 PM  Darius Khawaja  has presented today for surgery, with the diagnosis of hematemesis, anemia, cirrhosis, varices per ct.  The various methods of treatment have been discussed with the patient and family. After consideration of risks, benefits and other options for treatment, the patient has consented to  Procedure(s): ESOPHAGOGASTRODUODENOSCOPY (EGD) WITH PROPOFOL (N/A) as a surgical intervention.  The patient's history has been reviewed, patient examined, no change in status, stable for surgery.  I have reviewed the patient's chart and labs.  Questions were answered to the patient's satisfaction.     Lajuan Lines Lissa Rowles

## 2019-01-10 NOTE — Transfer of Care (Signed)
Immediate Anesthesia Transfer of Care Note  Patient: [name]  Procedure(s) Performed: ESOPHAGOGASTRODUODENOSCOPY (EGD) WITH PROPOFOL (N/A ) BIOPSY  Patient Location: Endoscopy Unit  Anesthesia Type:MAC  Level of Consciousness: drowsy and patient cooperative  Airway & Oxygen Therapy: Patient Spontanous Breathing and Patient connected to nasal cannula oxygen  Post-op Assessment: Report given to RN, Post -op Vital signs reviewed and stable and Patient moving all extremities  Post vital signs: Reviewed and stable  Last Vitals:  Vitals Value Taken Time  BP 104/55 01/10/19 1258  Temp    Pulse 60 01/10/19 1258  Resp 15 01/10/19 1258  SpO2 99 % 01/10/19 1258    Last Pain:  Vitals:   01/10/19 1158  TempSrc: Oral  PainSc: 0-No pain      Patients Stated Pain Goal: 0 (38/88/28 0034)  Complications: No apparent anesthesia complications

## 2019-01-10 NOTE — Progress Notes (Signed)
   01/10/19 0014  MEWS Score  Resp 15  ECG Heart Rate 67  Pulse Rate 66  BP 114/75  Temp 97.6 F (36.4 C)  Level of Consciousness Alert  SpO2 96 %  O2 Device Room Air  MEWS Score  MEWS RR 0  MEWS Pulse 0  MEWS Systolic 0  MEWS LOC 0  MEWS Temp 0  MEWS Score 0  MEWS Score Color Green  MEWS Assessment  Is this an acute change? No  MEWS Guidelines - (patients age 58 and over)  Red - At High Risk for Deterioration Yellow - At risk for Deterioration  1. Go to room and assess patient 2. Validate data. Is this patient's baseline? If data confirmed: 3. Is this an acute change? 4. Administer prn meds/treatments as ordered. 5. Note Sepsis score 6. Review goals of care 7. Sports coach, RRT nurse and Provider. 8. Ask Provider to come to bedside.  9. Document patient condition/interventions/response. 10. Increase frequency of vital signs and focused assessments to at least q15 minutes x 4, then q30 minutes x2. - If stable, then q1h x3, then q4h x3 and then q8h or dept. routine. - If unstable, contact Provider & RRT nurse. Prepare for possible transfer. 11. Add entry in progress notes using the smart phrase ".MEWS". 1. Go to room and assess patient 2. Validate data. Is this patient's baseline? If data confirmed: 3. Is this an acute change? 4. Administer prn meds/treatments as ordered? 5. Note Sepsis score 6. Review goals of care 7. Sports coach and Provider 8. Call RRT nurse as needed. 9. Document patient condition/interventions/response. 10. Increase frequency of vital signs and focused assessments to at least q2h x2. - If stable, then q4h x2 and then q8h or dept. routine. - If unstable, contact Provider & RRT nurse. Prepare for possible transfer. 11. Add entry in progress notes using the smart phrase ".MEWS".  Green - Likely stable Lavender - Comfort Care Only  1. Continue routine/ordered monitoring.  2. Review goals of care. 1. Continue routine/ordered  monitoring. 2. Review goals of care.

## 2019-01-10 NOTE — Anesthesia Postprocedure Evaluation (Signed)
Anesthesia Post Note  Patient: [name]  Procedure(s) Performed: ESOPHAGOGASTRODUODENOSCOPY (EGD) WITH PROPOFOL (N/A ) BIOPSY     Patient location during evaluation: Endoscopy Anesthesia Type: MAC Level of consciousness: awake and alert Pain management: pain level controlled Vital Signs Assessment: post-procedure vital signs reviewed and stable Respiratory status: spontaneous breathing, nonlabored ventilation and respiratory function stable Cardiovascular status: stable and blood pressure returned to baseline Postop Assessment: no apparent nausea or vomiting Anesthetic complications: no    Last Vitals:  Vitals:   01/10/19 1300 01/10/19 1305  BP:  108/61  Pulse: 61 62  Resp: (!) 22 15  Temp: (!) 36.2 C   SpO2: 99% 100%    Last Pain:  Vitals:   01/10/19 1158  TempSrc: Oral  PainSc: 0-No pain                 Lynda Rainwater

## 2019-01-10 NOTE — Progress Notes (Signed)
CRITICAL VALUE ALERT  Critical Value:  Hemoglobin 6.9  Date & Time Notified:  01/10/19 0425  Provider Notified:  Tylene Fantasia, NP  Orders Received/Actions taken:  1U PRBC

## 2019-01-10 NOTE — Anesthesia Procedure Notes (Signed)
Procedure Name: MAC Date/Time: 01/10/2019 12:38 PM Performed by: Inda Coke, CRNA Pre-anesthesia Checklist: Patient identified, Emergency Drugs available, Suction available, Timeout performed and Patient being monitored Patient Re-evaluated:Patient Re-evaluated prior to induction Oxygen Delivery Method: Nasal cannula Induction Type: IV induction Dental Injury: Teeth and Oropharynx as per pre-operative assessment

## 2019-01-10 NOTE — Progress Notes (Signed)
PROGRESS NOTE    [name]  EQA:[id number] DOB: 1960/07/08 DOA: 01/07/2019 PCP: Fleet Contras, MD   Brief Narrative:  HPI On 01/08/2019 by Dr. Madelyn Flavors [name] is a 58 y.o. male with medical history significant of cirrhosis with splenomegaly and esophageal varices, hepatitis C previous treated, adrenal insufficiency, hypothyroidism, schizoaffective disorder, PTSD, coagulopathy prior overdose, GI bleed, and GERD; who presented yesterday with complaints of suicidal ideation.  Patient notes that every year around this time he has thoughts of wanting to hurt himself, but did not go into further details.  He reports falling a couple days ago onto his left hand, but denies any loss of consciousness or trauma to his head.  At baseline patient has chronic pain in addition to pain in his left hand, and had recently ran out of his pain medications.  Currently denies having any chest pain, shortness of breath, fever, chills, nausea, or abdominal pain at this time.  Unclear if the patient has been taking NSAIDs, but appear on his medication list.  Patient has had several emergency department visits in the last 2 weeks.  Last admitted into the hospital back in May where patient was noted to have a GI bleed requiring transfusion of blood products but ultimately refused EGD and left AGAINST MEDICAL ADVICE.  Interim history Patient admitted with hematemesis, anemia.  Has been transfused 2 units PRBC.  GI consulted, EGD today.  Patient also with suicidal ideation, have consulted psychiatry. Assessment & Plan   Hematemesis, normocytic anemia, history of esophageal varices -Patient with previous history of GI bleeding May 2020 but refused EGD at that time -CT scan of the abdomen pelvis in May noted esophageal varices -Gastroenterology consulted and appreciated, currently on octreotide and Protonix drips -Status post EGD: LA grade D acute esophagitis.  2 cm hiatal hernia.  Medium amount of food residue in  the stomach.  Very large nonbleeding gastric ulcer in the antrum with no stigmata meeting.  Nonbleeding duodenal ulcers.  Recommendations are to continue PPI twice daily for at least 12 weeks and then daily thereafter with repeat upper endoscopy in 12 weeks.  Avoid NSAIDs.  Pathology results.  Acute on chronic anemia/acute blood loss anemia -Secondary to GI bleed -Patient transfused 2 units PRBC and 1 unit FFP -Hemoglobin this morning 7.4 (was 5.6 on admission) -Continue to monitor CBC  Acute hepatic encephalopathy, hepatic cirrhosis -On admission, ammonia level 113 -Continue lactulose 30 g 3 times daily  Suicidal ideation, PTSD -Patient reports being depressed and getting like this every year around this time and has thoughts of wanting to hurt himself -Currently has sitter -Psychiatry consulted and appreciated -Continue Haldol and Ativan PRN  Communicated fracture of the left fifth metacarpal -Left hand appears to be swollen on exam with scarring -Continue pain control  Prolonged QT interval -On admission, QTC was 523 -Continue to monitor and correct any electrolyte abnormalities  Chronic kidney disease, stage III -Baseline creatinine approximate 1.7-2 -Currently stable, continue to monitor BMP  Chronic pain -Continue oxycodone  History of hypothyroidism -Currently not on medication -TSH 0.707 (WNL)  History of hepatitis -Previously reported and treated with Epclusa in 2018 at [facility]  DVT Prophylaxis  SCDs  Code Status: Full  Family Communication: None at bedside  Disposition Plan: Admitted. EGD today. Pending psych consult.   Consultants Gastroenterology  Psychiatry  Procedures  EGD  Antibiotics   Anti-infectives (From admission, onward)   None      Subjective:   [name] seen and examined  today.  Patient wanting water.  Otherwise no complaints this morning.  Denied chest pain or shortness of breath, abdominal pain, further vomiting or  nausea.  No dizziness or headache. Objective:   Vitals:   01/10/19 1158 01/10/19 1258 01/10/19 1300 01/10/19 1305  BP: 132/64 (!) 104/55  108/61  Pulse: 67 60 61 62  Resp: 15 15 (!) 22 15  Temp: 98.6 F (37 C)  (!) 97.1 F (36.2 C)   TempSrc: Oral     SpO2: 98% 99% 99% 100%  Weight:      Height:        Intake/Output Summary (Last 24 hours) at 01/10/2019 1317 Last data filed at 01/10/2019 1250 Gross per 24 hour  Intake 5250.17 ml  Output 1200 ml  Net 4050.17 ml   Filed Weights   01/07/19 1200  Weight: 99.8 kg    Exam  General: Well developed, well nourished, NAD, appears stated age  42: NCAT, mucous membranes moist.   Cardiovascular: S1 S2 auscultated, RRR, no murmur  Respiratory: Clear to auscultation bilaterally, no wheezing  Abdomen: Soft, nontender, nondistended, + bowel sounds  Extremities: warm dry without cyanosis clubbing or edema  Neuro: AAOx3, nonfocal  Psych: flat   Data Reviewed: I have personally reviewed following labs and imaging studies  CBC: Recent Labs  Lab 01/06/19 1437 01/08/19 0846  01/09/19 0051 01/09/19 0909 01/09/19 1830 01/10/19 0352 01/10/19 0958  WBC 5.4 9.2  --  5.5  --   --  3.0*  --   NEUTROABS  --  7.0  --   --   --   --   --   --   HGB 8.8* 8.2*   < > 5.6* 6.8* 7.5* 6.9* 7.4*  HCT 28.8* 27.1*   < > 17.8* 21.1* 23.6* 21.2* 23.0*  MCV 97.6 99.3  --  98.3  --   --  95.1  --   PLT 104* 164  --  88*  --   --  83*  --    < > = values in this interval not displayed.   Basic Metabolic Panel: Recent Labs  Lab 01/06/19 1437 01/08/19 0846 01/09/19 0300 01/10/19 0352  NA 141 142 142 144  K 4.1 4.6 4.8 4.1  CL 112* 114* 115* 118*  CO2 21* 23 21* 17*  GLUCOSE 93 103* 92 95  BUN 27* 38* 49* 41*  CREATININE 2.26* 2.15* 2.02* 1.87*  CALCIUM 8.6* 8.6* 8.5* 8.6*   GFR: Estimated Creatinine Clearance: 55.7 mL/min (A) (by C-G formula based on SCr of 1.87 mg/dL (H)). Liver Function Tests: Recent Labs  Lab 01/08/19  0846 01/09/19 0300  AST 20 19  ALT 11 11  ALKPHOS 56 43  BILITOT 1.7* 1.6*  PROT 4.9* 4.2*  ALBUMIN 2.5* 2.2*   No results for input(s): LIPASE, AMYLASE in the last 168 hours. Recent Labs  Lab 01/08/19 0846 01/09/19 0300  AMMONIA 113* 19   Coagulation Profile: Recent Labs  Lab 01/08/19 1424 01/09/19 0909  INR 1.6* 1.4*   Cardiac Enzymes: Recent Labs  Lab 01/08/19 0846  CKTOTAL 107   BNP (last 3 results) No results for input(s): PROBNP in the last 8760 hours. HbA1C: No results for input(s): HGBA1C in the last 72 hours. CBG: No results for input(s): GLUCAP in the last 168 hours. Lipid Profile: No results for input(s): CHOL, HDL, LDLCALC, TRIG, CHOLHDL, LDLDIRECT in the last 72 hours. Thyroid Function Tests: Recent Labs    01/08/19 1424  TSH 0.707  Anemia Panel: No results for input(s): VITAMINB12, FOLATE, FERRITIN, TIBC, IRON, RETICCTPCT in the last 72 hours. Urine analysis:    Component Value Date/Time   COLORURINE YELLOW 08/07/2018 0634   APPEARANCEUR CLEAR 08/07/2018 0634   LABSPEC 1.009 08/07/2018 0634   PHURINE 6.0 08/07/2018 0634   GLUCOSEU NEGATIVE 08/07/2018 0634   HGBUR NEGATIVE 08/07/2018 0634   BILIRUBINUR NEGATIVE 08/07/2018 0634   KETONESUR NEGATIVE 08/07/2018 0634   PROTEINUR NEGATIVE 08/07/2018 0634   UROBILINOGEN 1.0 01/19/2013 0015   NITRITE NEGATIVE 08/07/2018 0634   LEUKOCYTESUR NEGATIVE 08/07/2018 0634   Sepsis Labs: @LABRCNTIP (procalcitonin:4,lacticidven:4)  ) Recent Results (from the past 240 hour(s))  SARS CORONAVIRUS 2 (TAT 6-24 HRS) Nasopharyngeal Nasopharyngeal Swab     Status: None   Collection Time: 01/06/19  2:43 PM   Specimen: Nasopharyngeal Swab  Result Value Ref Range Status   SARS Coronavirus 2 NEGATIVE NEGATIVE Final    Comment: (NOTE) SARS-CoV-2 target nucleic acids are NOT DETECTED. The SARS-CoV-2 RNA is generally detectable in upper and lower respiratory specimens during the acute phase of infection.  Negative results do not preclude SARS-CoV-2 infection, do not rule out co-infections with other pathogens, and should not be used as the sole basis for treatment or other patient management decisions. Negative results must be combined with clinical observations, patient history, and epidemiological information. The expected result is Negative. Fact Sheet for Patients: 03/08/19 Fact Sheet for Healthcare Providers: HairSlick.no This test is not yet approved or cleared by the quierodirigir.com FDA and  has been authorized for detection and/or diagnosis of SARS-CoV-2 by FDA under an Emergency Use Authorization (EUA). This EUA will remain  in effect (meaning this test can be used) for the duration of the COVID-19 declaration under Section 56 4(b)(1) of the Act, 21 U.S.C. section 360bbb-3(b)(1), unless the authorization is terminated or revoked sooner. Performed at [facility], [address], [place], [place]   SARS CORONAVIRUS 2 (TAT 6-24 HRS) Nasopharyngeal Nasopharyngeal Swab     Status: None   Collection Time: 01/08/19 10:37 AM   Specimen: Nasopharyngeal Swab  Result Value Ref Range Status   SARS Coronavirus 2 NEGATIVE NEGATIVE Final    Comment: (NOTE) SARS-CoV-2 target nucleic acids are NOT DETECTED. The SARS-CoV-2 RNA is generally detectable in upper and lower respiratory specimens during the acute phase of infection. Negative results do not preclude SARS-CoV-2 infection, do not rule out co-infections with other pathogens, and should not be used as the sole basis for treatment or other patient management decisions. Negative results must be combined with clinical observations, patient history, and epidemiological information. The expected result is Negative. Fact Sheet for Patients: 03/10/19 Fact Sheet for Healthcare Providers: HairSlick.no  This test is not yet approved or cleared by the quierodirigir.com FDA and  has been authorized for detection and/or diagnosis of SARS-CoV-2 by FDA under an Emergency Use Authorization (EUA). This EUA will remain  in effect (meaning this test can be used) for the duration of the COVID-19 declaration under Section 56 4(b)(1) of the Act, 21 U.S.C. section 360bbb-3(b)(1), unless the authorization is terminated or revoked sooner. Performed at [facility], [address], [place], [place]       Radiology Studies: No results found.   Scheduled Meds: . [MAR Hold] sodium chloride   Intravenous Once  . [MAR Hold] lactulose  30 g Oral TID  . pantoprazole  40 mg Oral BID AC  . [MAR Hold] phytonadione  5 mg Oral Daily  . [  MAR Hold] sodium chloride flush  3 mL Intravenous Q12H  . [MAR Hold] trihexyphenidyl  2 mg Oral TID   Continuous Infusions: . sodium chloride 75 mL/hr at 01/10/19 0043     LOS: 2 days   Time Spent in minutes   30 minutes  Joelie Schou D.O. on 01/10/2019 at 1:17 PM  Between 7am to 7pm - Please see pager noted on [email or web address]  After 7pm go to [email or web address]  And look for the night coverage person covering for me after hours  [facility] Office  [phone]

## 2019-01-10 NOTE — Plan of Care (Signed)

## 2019-01-10 NOTE — Op Note (Signed)
[facility] Name: [name] Procedure Date : 01/10/2019 MRN: [id number] Attending MD: Beverley FiedlerJay M Polk Minor , MD Date of Birth: 09/01/1960 CSN: [id number] Age: 58 Admit Type: Inpatient Procedure:                Upper GI endoscopy Indications:              Hematemesis, Cirrhosis rule out esophageal varices Providers:                Carie CaddyJay M. Rhea BeltonPyrtle, MD, [place] BailiffMaggie Chrismon, RN, Lanna PocheJustin                            Carter, Technician Referring MD:             [facility] Medicines:                Monitored Anesthesia Care Complications:            No immediate complications. Estimated Blood Loss:     Estimated blood loss was minimal. Procedure:                Pre-Anesthesia Assessment:                           - Prior to the procedure, a History and Physical                            was performed, and patient medications and                            allergies were reviewed. The patient's tolerance of                            previous anesthesia was also reviewed. The risks                            and benefits of the procedure and the sedation                            options and risks were discussed with the patient.                            All questions were answered, and informed consent                            was obtained. Prior Anticoagulants: The patient has                            taken no previous anticoagulant or antiplatelet                            agents. ASA Grade Assessment: III - A patient with                            severe systemic disease. After reviewing the risks  and benefits, the patient was deemed in                            satisfactory condition to undergo the procedure.                           After obtaining informed consent, the endoscope was                            passed under direct vision. Throughout the                            procedure, the patient's blood pressure, pulse,  and                            oxygen saturations were monitored continuously. The                            GIF-H190 ([id number]) Olympus gastroscope was                            introduced through the mouth, and advanced to the                            second part of duodenum. The upper GI endoscopy was                            accomplished without difficulty. The patient                            tolerated the procedure well. Scope In: Scope Out: Findings:      LA Grade D (one or more mucosal breaks involving at least 75% of       esophageal circumference) esophagitis with no bleeding was found in the       middle third of the esophagus. This is likely acid reflux related.      There is no endoscopic evidence of varices in the lower third of the       esophagus.      A 2 cm hiatal hernia was present.      A medium amount of food (residue) was found in the gastric body. This       retained food obscured complete views of the gastric mucosa and also       made complete retroflexed view difficult.      One non-bleeding cratered gastric ulcer with no stigmata of bleeding was       found in the gastric antrum. The lesion was 5 cm mm in largest       dimension. Biopsies were taken with a cold forceps for histology and       Helicobacter pylori testing.      Multiple non-bleeding superficial duodenal ulcers were found in the       duodenal bulb.      The second portion of the duodenum was normal. Impression:               - LA Grade D acute esophagitis.                           -  2 cm hiatal hernia.                           - A medium amount of food (residue) in the stomach.                           - Very large, non-bleeding gastric ulcer in antrum                            with no stigmata of bleeding. Biopsied.                           - Non-bleeding duodenal ulcers.                           - Normal second portion of the duodenum. Moderate Sedation:       N/A Recommendation:           - Return patient to hospital ward for ongoing care.                           - Advance diet as tolerated.                           - Continue present medications.                           - No aspirin, ibuprofen, naproxen, or other                            non-steroidal anti-inflammatory drugs.                           - Await pathology results.                           - BID PPI for at least 12 weeks, then daily                            thereafter.                           - Repeat upper endoscopy in 12 weeks to check                            healing and to evaluate the response to therapy.                           - Return to GI clinic at appointment to be                            scheduled after discharge. Procedure Code(s):        --- Professional ---                           [id number], Esophagogastroduodenoscopy, flexible,  transoral; with biopsy, single or multiple Diagnosis Code(s):        --- Professional ---                           K20.9, Esophagitis, unspecified                           K44.9, Diaphragmatic hernia without obstruction or                            gangrene                           K25.9, Gastric ulcer, unspecified as acute or                            chronic, without hemorrhage or perforation                           K26.9, Duodenal ulcer, unspecified as acute or                            chronic, without hemorrhage or perforation                           K92.0, Hematemesis                           K74.60, Unspecified cirrhosis of liver CPT copyright 2019 American Medical Association. All rights reserved. The codes documented in this report are preliminary and upon coder review may  be revised to meet current compliance requirements. Beverley Fiedler, MD 01/10/2019 1:04:33 PM This report has been signed electronically. Number of Addenda: 0

## 2019-01-10 NOTE — Anesthesia Preprocedure Evaluation (Signed)
Anesthesia Evaluation  Patient identified by MRN, date of birth, ID band Patient awake    Reviewed: Allergy & Precautions, NPO status , Patient's Chart, lab work & pertinent test results  Airway Mallampati: II  TM Distance: >3 FB Neck ROM: Full    Dental no notable dental hx.    Pulmonary neg pulmonary ROS, Current Smoker and Patient abstained from smoking.,    Pulmonary exam normal breath sounds clear to auscultation       Cardiovascular negative cardio ROS Normal cardiovascular exam Rhythm:Regular Rate:Normal     Neuro/Psych Anxiety Schizophrenia negative neurological ROS  negative psych ROS   GI/Hepatic GERD  ,(+) Hepatitis -, C  Endo/Other  Hypothyroidism   Renal/GU Renal InsufficiencyRenal disease  negative genitourinary   Musculoskeletal negative musculoskeletal ROS (+)   Abdominal   Peds negative pediatric ROS (+)  Hematology negative hematology ROS (+) anemia ,   Anesthesia Other Findings   Reproductive/Obstetrics negative OB ROS                             Anesthesia Physical Anesthesia Plan  ASA: III  Anesthesia Plan: MAC   Post-op Pain Management:    Induction: Intravenous  PONV Risk Score and Plan: 0 and Treatment may vary due to age or medical condition  Airway Management Planned: Nasal Cannula  Additional Equipment:   Intra-op Plan:   Post-operative Plan:   Informed Consent: I have reviewed the patients History and Physical, chart, labs and discussed the procedure including the risks, benefits and alternatives for the proposed anesthesia with the patient or authorized representative who has indicated his/her understanding and acceptance.     Dental advisory given  Plan Discussed with: CRNA  Anesthesia Plan Comments:         Anesthesia Quick Evaluation

## 2019-01-10 NOTE — Progress Notes (Signed)
Chaplain responded to consult for prayer. [name] did not want prayer. [name] is Muslim. [name] says he is ready to go home. He no longer wants to hurt himself. He states that he likes himself now and he likes the new medicine. [name] promises to be safe in his car if the doctors will let him go home. Chaplain remains available per request.   Chaplain Resident, Evelene Croon, M Div Pager # [phone] Pager # [phone]

## 2019-01-11 ENCOUNTER — Encounter: Payer: Self-pay | Admitting: Gastroenterology

## 2019-01-11 ENCOUNTER — Encounter (HOSPITAL_COMMUNITY): Payer: Self-pay | Admitting: Internal Medicine

## 2019-01-11 DIAGNOSIS — F4310 Post-traumatic stress disorder, unspecified: Secondary | ICD-10-CM

## 2019-01-11 LAB — BPAM RBC
Blood Product Expiration Date: 202010162359
Blood Product Expiration Date: 202010172359
Blood Product Expiration Date: 202010262359
Blood Product Expiration Date: 202011032359
ISSUE DATE / TIME: 202010122051
ISSUE DATE / TIME: 202010130248
ISSUE DATE / TIME: 202010131403
ISSUE DATE / TIME: 202010140518
Unit Type and Rh: 600
Unit Type and Rh: 6200
Unit Type and Rh: 6200
Unit Type and Rh: 6200

## 2019-01-11 LAB — PREPARE FRESH FROZEN PLASMA: Unit division: 0

## 2019-01-11 LAB — TYPE AND SCREEN
ABO/RH(D): A POS
Antibody Screen: NEGATIVE
Unit division: 0
Unit division: 0
Unit division: 0
Unit division: 0

## 2019-01-11 LAB — HEMOGLOBIN AND HEMATOCRIT, BLOOD
HCT: 24.5 % — ABNORMAL LOW (ref 39.0–52.0)
Hemoglobin: 7.8 g/dL — ABNORMAL LOW (ref 13.0–17.0)

## 2019-01-11 LAB — BASIC METABOLIC PANEL
Anion gap: 5 (ref 5–15)
BUN: 33 mg/dL — ABNORMAL HIGH (ref 6–20)
CO2: 20 mmol/L — ABNORMAL LOW (ref 22–32)
Calcium: 8.7 mg/dL — ABNORMAL LOW (ref 8.9–10.3)
Chloride: 117 mmol/L — ABNORMAL HIGH (ref 98–111)
Creatinine, Ser: 1.95 mg/dL — ABNORMAL HIGH (ref 0.61–1.24)
GFR calc Af Amer: 43 mL/min — ABNORMAL LOW (ref >60)
GFR calc non Af Amer: 37 mL/min — ABNORMAL LOW (ref >60)
Glucose, Bld: 106 mg/dL — ABNORMAL HIGH (ref 70–99)
Potassium: 4.1 mmol/L (ref 3.5–5.1)
Sodium: 142 mmol/L (ref 135–145)

## 2019-01-11 LAB — BPAM FFP
Blood Product Expiration Date: 202010142359
Blood Product Expiration Date: 202010142359
ISSUE DATE / TIME: 202010130248
Unit Type and Rh: 6200
Unit Type and Rh: 6200

## 2019-01-11 LAB — MAGNESIUM: Magnesium: 1.8 mg/dL (ref 1.7–2.4)

## 2019-01-11 LAB — SURGICAL PATHOLOGY

## 2019-01-11 MED ORDER — HALOPERIDOL 2 MG PO TABS: 2.0000 mg | ORAL_TABLET | Freq: Two times a day (BID) | ORAL | 0 refills | Status: DC

## 2019-01-11 MED ORDER — PANTOPRAZOLE SODIUM 40 MG PO TBEC: 40.0000 mg | DELAYED_RELEASE_TABLET | Freq: Two times a day (BID) | ORAL | 2 refills | Status: DC

## 2019-01-11 MED ORDER — LACTULOSE 10 GM/15ML PO SOLN: 30.0000 g | Freq: Two times a day (BID) | ORAL | 0 refills | Status: DC | PRN

## 2019-01-11 NOTE — Progress Notes (Signed)
Pt given discharge instructions, prescriptions, and care notes. Reviewed all meds & f/u appts with Pt. Pt verbalized understanding AEB no further questions or concerns at this time. IV was discontinued, no redness, pain, or swelling noted at this time. Telemetry discontinued and Centralized Telemetry was notified. Pt left the floor via wheelchair with staff in stable condition. Taxi called for Pt.

## 2019-01-11 NOTE — Discharge Instructions (Signed)
[facility] ° °Mental health service in [place], [place] °COVID-19 info: [email or web address] °Get online care: [email or web address] °Address: [address], [place], [place] [address] °Hours:  °Open ? Closes 7PM °Phone: [phone] °

## 2019-01-11 NOTE — Consult Note (Signed)
[facility] Consult   Reason for Consult:  Suicidal ideations  Referring Physician: Dr.Mikhail Patient Identification: [name] MRN:  [id number] Principal Diagnosis: Hematemesis with nausea Diagnosis:  Principal Problem:   Hematemesis with nausea Active Problems:   PTSD (post-traumatic stress disorder)   Normocytic anemia   GERD (gastroesophageal reflux disease)   Hematemesis   Prolonged QT interval   Esophageal varices in cirrhosis (HCC)   CKD (chronic kidney disease) stage 3, GFR 30-59 ml/min   Suicidal ideation   Closed displaced fracture of neck of left fifth metacarpal bone   Acute gastric ulcer without hemorrhage or perforation   Duodenal ulcer   Acute esophagitis   Total Time spent with patient: 1 hour  Subjective:   [name] is a 58 y.o. male patient admitted with GI bleed.  "I'm fine.  No suicidal thoughts.  I was not sleeping and was in pain, now I am not."  Patient seen and evaluated in person by this provider.  Patient denies suicidal ideation.  He contributes these thoughts on admission to pain and lack of sleep.  Patient reports he has been sleeping well since admission and that the Haldol and Artane are working well for him.  His pain is also under control.  Denies suicidal/homicidal ideations, hallucinations, and substance abuse.  He wants to go home as he is feeling better.  Psychiatrically cleared at this time  HPI per MD:  HPI: [name] is a 58 y.o. male with medical history significant of cirrhosis with splenomegaly and esophageal varices, hepatitis C previous treated, adrenal insufficiency, hypothyroidism, schizoaffective disorder, PTSD, coagulopathy prior overdose, GI bleed, and GERD; who presented yesterday with complaints of suicidal ideation.  Patient notes that every year around this time he has thoughts of wanting to hurt himself, but did not go into further details.  He reports falling a couple days ago onto his left hand, but denies  any loss of consciousness or trauma to his head.  At baseline patient has chronic pain in addition to pain in his left hand, and had recently ran out of his pain medications.  Currently denies having any chest pain, shortness of breath, fever, chills, nausea, or abdominal pain at this time.  Unclear if the patient has been taking NSAIDs, but appear on his medication list.  Patient has had several emergency department visits in the last 2 weeks.  Last admitted into the hospital back in May where patient was noted to have a GI bleed requiring transfusion of blood products but ultimately refused EGD and left AGAINST MEDICAL ADVICE.  Past Psychiatric History: PTSD, depression, personality disorder  Risk to Self: None Risk to Others: Homicidal Ideation: No Thoughts of Harm to Others: No Current Homicidal Intent: No Current Homicidal Plan: No Access to Homicidal Means: No History of harm to others?: No Assessment of Violence: None Noted Does patient have access to weapons?: No Criminal Charges Pending?: No Does patient have a court date: No Prior Inpatient Therapy: Prior Inpatient Therapy: Yes Prior Therapy Dates: Pt could not recall Prior Therapy Facilty/Provider(s): Pt could not recall Reason for Treatment: Schizoaffective d/o; PTSD Prior Outpatient Therapy: Prior Outpatient Therapy: Yes Prior Therapy Dates: Pt could not recall Prior Therapy Facilty/Provider(s): Pt could not recall Reason for Treatment: Schizoaffective Disorder Does patient have an ACCT team?: No Does patient have Intensive In-House Services?  : No Does patient have [facility] services? : No Does patient have P4CC services?: No  Past Medical History:  Past Medical History:  Diagnosis Date  .  Anemia   . Ascites   . Chronic leg pain   . Cirrhosis (HCC)   . GERD (gastroesophageal reflux disease)   . H/O diabetes insipidus   . Hepatitis C    Completed therapy with [facility] ~ 2018.  suspect therapy overseen by [facility]  [place]  . History of adrenal insufficiency   . History of ARDS   . History of blood transfusion   . Hypothyroidism   . Kidney disease    stage 3  . Neuropathy   . Overdose 12/2009  . Peripheral neuropathy   . Personality disorder (HCC)   . PTSD (post-traumatic stress disorder)    [place]COND[place]  . Schizoaffective disorder [facility](HCC)     Past Surgical History:  Procedure Laterality Date  . CERVICAL SPINE SURGERY  unknown  . RIGHT HIP SURGERY  1996  . US GUIDED LEFT THORACENTESIS  01/21/2010   Family History:  Family History  Problem Relation Age of Onset  . Cancer Mother   . Heart attack Father    Family Psychiatric  History: none Social History:  Social History   Substance and Sexual Activity  Alcohol Use No   Comment: Pt denies      Social History   Substance and Sexual Activity  Drug Use No   Comment: Pt denies    Social History   Socioeconomic History  . Marital status: Single    Spouse name: Not on file  . Number of children: 0  . Years of education: 8712  . Highest education level: Not on file  Occupational History    Comment: umemployed  Social Needs  . Financial resource strain: Not on file  . Food insecurity    Worry: Not on file    Inability: Not on file  . Transportation needs    Medical: Not on file    Non-medical: Not on file  Tobacco Use  . Smoking status: Current Every Day Smoker    Packs/day: 1.00    Years: 25.00    Pack years: 25.00    Types: Cigarettes  . Smokeless tobacco: Never Used  Substance and Sexual Activity  . Alcohol use: No    Comment: Pt denies   . Drug use: No    Comment: Pt denies  . Sexual activity: Not Currently  Lifestyle  . Physical activity    Days per week: Not on file    Minutes per session: Not on file  . Stress: Not on file  Relationships  . Social Musicianconnections    Talks on phone: Not on file    Gets together: Not on file    Attends religious service: Not on file     Active member of club or organization: Not on file    Attends meetings of clubs or organizations: Not on file    Relationship status: Not on file  Other Topics Concern  . Not on file  Social History Narrative   Single, Lives with mother   Emigrated from [place] during[place]after [place]   Smoker, no drugs, denies recent EtOH   Unemployed   Medicaid [employer]   Additional Social History:    Allergies:  No Known Allergies  Labs:  Results for orders placed or performed during the hospital encounter of 01/07/19 (from the past 48 hour(s))  Hemoglobin and hematocrit, blood     Status: Abnormal   Collection Time: 01/09/19  9:09 AM  Result Value Ref Range   Hemoglobin  6.8 (LL) 13.0 - 17.0 g/dL    Comment: REPEATED TO VERIFY THIS CRITICAL RESULT HAS VERIFIED AND BEEN CALLED TO A.OAKLEY RN BY IMANI MANNING ON 10 13 2020 AT 0919, AND HAS BEEN READ BACK.     HCT 21.1 (L) 39.0 - 52.0 %    Comment: Performed at [facility], [address], [place], [place] [address]  Protime-INR     Status: Abnormal   Collection Time: 01/09/19  9:09 AM  Result Value Ref Range   Prothrombin Time 17.3 (H) 11.4 - 15.2 seconds   INR 1.4 (H) 0.8 - 1.2    Comment: (NOTE) INR goal varies based on device and disease states. Performed at [facility], [address], [place], [place] [address]   Prepare RBC     Status: None   Collection Time: 01/09/19  9:54 AM  Result Value Ref Range   Order Confirmation      ORDER PROCESSED BY BLOOD BANK Performed at [facility], [address] [place], [place] [address]   Hemoglobin and hematocrit, blood     Status: Abnormal   Collection Time: 01/09/19  6:30 PM  Result Value Ref Range   Hemoglobin 7.5 (L) 13.0 - 17.0 g/dL   HCT 24.2 (L) 68.3 - 41.9 %    Comment: Performed at [facility], [address] [place], [place] [address]  Basic metabolic panel     Status: Abnormal   Collection Time: 01/10/19  3:52 AM  Result Value  Ref Range   Sodium 144 135 - 145 mmol/L   Potassium 4.1 3.5 - 5.1 mmol/L   Chloride 118 (H) 98 - 111 mmol/L   CO2 17 (L) 22 - 32 mmol/L   Glucose, Bld 95 70 - 99 mg/dL   BUN 41 (H) 6 - 20 mg/dL   Creatinine, Ser 7.98 (H) 0.61 - 1.24 mg/dL   Calcium 8.6 (L) 8.9 - 10.3 mg/dL   GFR calc non Af Amer 39 (L) >60 mL/min   GFR calc Af Amer 45 (L) >60 mL/min   Anion gap 9 5 - 15    Comment: Performed at [facility], [address] [place], [place] [address]  CBC     Status: Abnormal   Collection Time: 01/10/19  3:52 AM  Result Value Ref Range   WBC 3.0 (L) 4.0 - 10.5 K/uL   RBC 2.23 (L) 4.22 - 5.81 MIL/uL   Hemoglobin 6.9 (LL) 13.0 - 17.0 g/dL    Comment: REPEATED TO VERIFY THIS CRITICAL RESULT HAS VERIFIED AND BEEN CALLED TO M.OVERBY,RN BY MELISSA BROGDON ON 10 14 2020 AT 0421, AND HAS BEEN READ BACK.     HCT 21.2 (L) 39.0 - 52.0 %   MCV 95.1 80.0 - 100.0 fL   MCH 30.9 26.0 - 34.0 pg   MCHC 32.5 30.0 - 36.0 g/dL   RDW 41.7 (H) 40.8 - 14.4 %   Platelets 83 (L) 150 - 400 K/uL    Comment: Immature Platelet Fraction may be clinically indicated, consider ordering this additional test [id number] CONSISTENT WITH PREVIOUS RESULT    nRBC 0.0 0.0 - 0.2 %    Comment: Performed at [facility], [address], [place], [place] [address]  Prepare RBC     Status: None   Collection Time: 01/10/19  4:38 AM  Result Value Ref Range   Order Confirmation      BB SAMPLE OR UNITS ALREADY AVAILABLE Performed  at [facility], [address] [place], [place] [address]   Hemoglobin and hematocrit, blood     Status: Abnormal   Collection Time: 01/10/19  9:58 AM  Result Value Ref Range   Hemoglobin 7.4 (L) 13.0 - 17.0 g/dL   HCT 60.423.0 (L) 54.039.0 - 98.152.0 %    Comment: Performed at [facility], [address] [place], [place] [address]  Hemoglobin and hematocrit, blood     Status: Abnormal   Collection Time: 01/11/19  2:58 AM  Result Value Ref Range   Hemoglobin 7.8 (L) 13.0 -  17.0 g/dL   HCT 82.924.5 (L) 56.239.0 - 13.052.0 %    Comment: Performed at [facility], [address] [place], [place] [address]  Basic metabolic panel     Status: Abnormal   Collection Time: 01/11/19  2:58 AM  Result Value Ref Range   Sodium 142 135 - 145 mmol/L   Potassium 4.1 3.5 - 5.1 mmol/L   Chloride 117 (H) 98 - 111 mmol/L   CO2 20 (L) 22 - 32 mmol/L   Glucose, Bld 106 (H) 70 - 99 mg/dL   BUN 33 (H) 6 - 20 mg/dL   Creatinine, Ser 4.691.95 (H) 0.61 - 1.24 mg/dL   Calcium 8.7 (L) 8.9 - 10.3 mg/dL   GFR calc non Af Amer 37 (L) >60 mL/min   GFR calc Af Amer 43 (L) >60 mL/min   Anion gap 5 5 - 15    Comment: Performed at [facility], [address] [place], [place] [address]    Current Facility-Administered Medications  Medication Dose Route Frequency Provider Last Rate Last Dose  . 0.9 %  sodium chloride infusion (Manually program via Guardrails IV Fluids)   Intravenous Once Kirby-Graham, Beather ArbourKaren J, NP      . 0.9 %  sodium chloride infusion   Intravenous Continuous Madelyn FlavorsSmith, Rondell A, MD 75 mL/hr at 01/10/19 1743    . acetaminophen (TYLENOL) tablet 650 mg  650 mg Oral Q6H PRN Clydie BraunSmith, Rondell A, MD       Or  . acetaminophen (TYLENOL) suppository 650 mg  650 mg Rectal Q6H PRN Smith, Rondell A, MD      . albuterol (PROVENTIL) (2.5 MG/3ML) 0.083% nebulizer solution 2.5 mg  2.5 mg Nebulization Q6H PRN Smith, Rondell A, MD      . haloperidol lactate (HALDOL) injection 5 mg  5 mg Intravenous Q6H PRN Noralee Stainhoi, Jennifer, DO   5 mg at 01/10/19 0415  . lactulose (CHRONULAC) 10 GM/15ML solution 30 g  30 g Oral TID Clydie BraunSmith, Rondell A, MD   Stopped at 01/10/19 1600  . oxyCODONE (Oxy IR/ROXICODONE) immediate release tablet 5 mg  5 mg Oral Q6H PRN Leda GauzeKirby-Graham, Karen J, NP   5 mg at 01/11/19 0454  . pantoprazole (PROTONIX) EC tablet 40 mg  40 mg Oral BID AC Pyrtle, Carie CaddyJay M, MD   40 mg at 01/10/19 1745  . phytonadione (VITAMIN K) tablet 5 mg  5 mg Oral Daily Dianah FieldGribbin, Sarah J, PA-C   5 mg at 01/10/19 1352  .  sodium chloride flush (NS) 0.9 % injection 3 mL  3 mL Intravenous Q12H Smith, Rondell A, MD   3 mL at 01/10/19 2132  . trihexyphenidyl (ARTANE) tablet 2 mg  2 mg Oral TID Noralee Stainhoi, Jennifer, DO   2 mg at 01/10/19 2127    Musculoskeletal: Strength & Muscle Tone: within normal limits Gait & Station: normal Patient leans: N/A  Psychiatric Specialty Exam: Physical Exam  Nursing note and vitals reviewed. Constitutional: He is oriented to person, place, and time. He appears well-developed and well-nourished.  HENT:  Right Ear: External ear normal.  Neck: Normal range of motion.  Respiratory: Effort normal.  Musculoskeletal: Normal range of motion.  Neurological: He is alert and oriented to person, place, and time.  Psychiatric: His speech is normal and behavior is normal. Judgment and thought content normal. His mood appears anxious. His affect is blunt. Cognition and memory are normal.    Review of Systems  Psychiatric/Behavioral: The patient is nervous/anxious.   All other systems reviewed and are negative.   Blood pressure (!) 123/92, pulse (!) 51, temperature 97.9 F (36.6 C), temperature source Oral, resp. rate (!) 21, height 6\' 6"  (1.981 m), weight 99.8 kg, SpO2 (!) 85 %.Body mass index is 25.43 kg/m.  General Appearance: Casual  Eye Contact:  Good  Speech:  Normal Rate  Volume:  Normal  Mood:  Anxious  Affect:  Congruent  Thought Process:  Coherent and Descriptions of Associations: Intact  Orientation:  Full (Time, Place, and Person)  Thought Content:  WDL and Logical  Suicidal Thoughts:  No  Homicidal Thoughts:  No  Memory:  Immediate;   Good Recent;   Good Remote;   Good  Judgement:  Fair  Insight:  Fair  Psychomotor Activity:  Normal  Concentration:  Concentration: Good and Attention Span: Good  Recall:  Good  Fund of Knowledge:  Good  Language:  Good  Akathisia:  No  Handed:  Right  AIMS (if indicated):     Assets:  Housing Leisure Time Resilience  ADL's:   Intact  Cognition:  WNL  Sleep:      Assessment: 58 year old male presenting to the emergency department for pain issues and suicidal thoughts.  History of chronic pain and schizoaffective disorder.  His depression seems to increase this time every year for unknown reason.  Patient contributes this negative thoughts on admission to lack of sleep, only sleeping an hour prior to admission, and pain.  He feels his pain is under control and feels balance with the Haldol and Artane.  Recommend switching Haldol IV to Haldol oral see below.  Patient request to leave and considering he is no longer suicidal or homicidal with no hallucinations or substance abuse he is psychiatrically cleared.  His friend on the emergency contact was was called with no answer, message left to return call.  Treatment Plan Summary: Schizoaffective disorder, depressive type: -Recommend Haldol 2 mg twice daily -Continue Artane 2 mg 3 times daily -Follow-up with outpatient mental health provider   Disposition: No evidence of imminent risk to self or others at present.   Patient does not meet criteria for psychiatric inpatient admission.  Nanine Means, NP 01/11/2019 7:30 AM

## 2019-01-11 NOTE — TOC Transition Note (Signed)
Transition of Care [facility]) - CM/SW Discharge Note   Patient Details  Name: Afnan Emberton MRN: [id number] Date of Birth: [birth year]  [facility] CM/SW Contact:  Benard Halsted, LCSW Phone Number: 01/11/2019, 12:40 PM   Clinical Narrative:    Patient has been cleared by psychiatry to return home. No other needs identified at this time as patient reports feeling better. Follow up counseling resources placed on AVS.    Final next level of care: Home/Self Care Barriers to Discharge: No Barriers Identified   Patient Goals and CMS Choice Patient states their goals for this hospitalization and ongoing recovery are:: Return home CMS Medicare.gov Compare Post Acute Care list provided to:: Patient Choice offered to / list presented to : Patient  Discharge Placement                       Discharge Plan and Services In-house Referral: Clinical Social Work   Post Acute Care Choice: NA          DME Arranged: N/A           HH Agency: NA        Social Determinants of Health (SDOH) Interventions     Readmission Risk Interventions Readmission Risk Prevention Plan 01/11/2019  Transportation Screening Complete  Medication Review Press photographer) Complete  PCP or Specialist appointment within 3-5 days of discharge Complete  HRI or [place] Complete  SW Recovery Care/Counseling Consult Complete  [place] Not Applicable  Some recent data might be hidden

## 2019-01-11 NOTE — Progress Notes (Signed)
Pt's belongings found in ED. Picked up by this RN. Belongings returned to Pt. Cane and bag with pants, shirt, jacket, shoes, wallet, and keys.

## 2019-01-11 NOTE — ED Notes (Addendum)
RN from 5W called ED to ask about this pt's belongings as this pt is being discharged. One bag of belongings in the locker and a walking cane. She is coming to pick up for pt. Belongings bag and cane picked up by Judson Roch, RN 5W

## 2019-01-11 NOTE — Discharge Summary (Signed)
Physician Discharge Summary  [name] SJG:[id number] DOB: [birth year] DOA: 01/07/2019  PCP: Nolene Ebbs, MD  Admit date: 01/07/2019 Discharge date: 01/11/2019  Time spent: 45 minutes  Recommendations for Outpatient Follow-up:  Patient will be discharged to home.  Patient will need to follow up with primary care provider within one week of discharge.  Follow up with gastroenterology and psychiatry. Patient should continue medications as prescribed.  Patient should follow a soft diet.   Discharge Diagnoses:  Hematemesis, normocytic anemia, history of esophageal varices Acute on chronic anemia/acute blood loss anemia Acute hepatic encephalopathy, hepatic cirrhosis Suicidal ideation, PTSD Communicated fracture of the left fifth metacarpal Prolonged QT interval Chronic kidney disease, stage IIIb Chronic pain History of hypothyroidism History of hepatitis  Discharge Condition: stable  Diet recommendation: soft  Filed Weights   01/07/19 1200  Weight: 99.8 kg    History of present illness:  On 01/08/2019 by Dr. Harlow Ohms [place] a 58 y.o.malewith medical history significant ofcirrhosis with splenomegaly and esophageal varices, hepatitis Cprevious treated, adrenal insufficiency, hypothyroidism, schizoaffective disorder, PTSD, coagulopathy prior overdose, GI bleed, and GERD; who presentedyesterday with complaints of suicidal ideation.Patient notes that every year around this time he has thoughts of wanting to hurt himself, but did not go into further details. He reports falling a couple days ago onto his left hand, but denies any loss of consciousness or trauma to his head. At baseline patient has chronic pain in addition to pain in his left hand, and had recently ran out of his pain medications. Currently denies having any chest pain, shortness of breath, fever, chills, nausea, or abdominal pain at this time.Unclear if the patient has been taking NSAIDs, but  appear on his medication list. Patient has had several emergency department visits in the last 2 weeks. Last admitted intothe hospital back in May where patient was noted to have a GI bleed requiring transfusion of blood products but ultimately refused EGD and left AGAINST MEDICAL ADVICE.  Hospital Course:  Hematemesis, normocytic anemia, history of esophageal varices -Patient with previous history of GI bleeding May 2020 but refused EGD at that time -CT scan of the abdomen pelvis in May noted esophageal varices -Gastroenterology consulted and appreciated, currently on octreotide and Protonix drips -Status post EGD: LA grade D acute esophagitis.  2 cm hiatal hernia.  Medium amount of food residue in the stomach.  Very large nonbleeding gastric ulcer in the antrum with no stigmata meeting.  Nonbleeding duodenal ulcers.  Recommendations are to continue PPI twice daily for at least 12 weeks and then daily thereafter with repeat upper endoscopy in 12 weeks.  Avoid NSAIDs.  Pathology results. -patient to follow up with GI  Acute on chronic anemia/acute blood loss anemia -Secondary to GI bleed -Patient transfused 2 units PRBC and 1 unit FFP, vit K -Hemoglobin this morning 7.8 (was 5.6 on admission) -Continue to monitor CBC  Acute hepatic encephalopathy, hepatic cirrhosis -Resolved, currently alert and orientedx3 -On admission, ammonia level 113- improved to 19 -Continue lactulose to ensure 2-3 loose bowel movements per day  Suicidal ideation, PTSD -Patient reports being depressed and getting like this every year around this time and has thoughts of wanting to hurt himself.  -He currently states he no longer wants to hurt himself and no longer has these thoughts. -Currently has sitter -Psychiatry consulted and appreciated- patient seen today and is not an imminent risk to self or others at present.  Does not meet criteria for psychiatric inpatient admission.  Recommended Haldol 2  mg twice  daily, continue Artane 2 mg 3 times daily.  Follow-up with outpatient mental health provider.  Communicated fracture of the left fifth metacarpal -Left hand appears to be swollen on exam with scarring -Continue pain control  Prolonged QT interval -On admission, QTC was 523- repeat EKG on 10/13 showed QTc 451 -Continue to monitor and correct any electrolyte abnormalities  Chronic kidney disease, stage IIIb -Baseline creatinine approximate 1.7-2 -Currently stable  Chronic pain -Continue oxycodone  History of hypothyroidism -Currently not on medication -TSH 0.707 (WNL)  History of hepatitis -[facility]  Consultants Gastroenterology  Psychiatry  Procedures  EGD  Discharge Exam: Vitals:   01/11/19 0815 01/11/19 1225  BP: (!) 119/57 116/73  Pulse: 62 61  Resp: 15 13  Temp: 98 F (36.7 C) 98.1 F (36.7 C)  SpO2: 98% 100%     General: Well developed, well nourished, NAD, appears stated age  HEENT: NCAT,mucous membranes moist.  Cardiovascular: S1 S2 auscultated, RRR  Respiratory: Clear to auscultation bilaterally  Extremities: warm dry without cyanosis clubbing or edema  Neuro: AAOx3, nonfocal  Psych: Normal affect and demeanor   Discharge Instructions Discharge Instructions    Discharge instructions   Complete by: As directed    Patient will be discharged to home.  Patient will need to follow up with primary care provider within one week of discharge, CBC.  Follow up with gastroenterology and psychiatry. Patient should continue medications as prescribed.  Patient should follow a soft diet.     Allergies as of 01/11/2019   No Known Allergies     Medication List    STOP taking these medications   fluPHENAZine 5 MG tablet Commonly known as: PROLIXIN   ibuprofen 800 MG tablet Commonly known as: ADVIL   LORazepam 0.5 MG tablet Commonly known as: ATIVAN   meloxicam 15 MG tablet  Commonly known as: MOBIC     TAKE these medications   [employer] D3 50 MCG (2000 UT) Caps Generic drug: Cholecalciferol Take 2,000 Units by mouth daily.   diphenhydrAMINE 25 MG tablet Commonly known as: BENADRYL Take 25 mg by mouth at bedtime.   gabapentin 100 MG capsule Commonly known as: NEURONTIN Take 200 mg by mouth 3 (three) times daily.   haloperidol 2 MG tablet Commonly known as: HALDOL Take 1 tablet (2 mg total) by mouth 2 (two) times daily.   lactulose 10 GM/15ML solution Commonly known as: CHRONULAC Take 45 mLs (30 g total) by mouth 2 (two) times daily as needed (To have at least 2-3 loose bowel movements per day.).   Oxycodone HCl 10 MG Tabs Take 10 mg by mouth every 8 (eight) hours.   pantoprazole 40 MG tablet Commonly known as: PROTONIX Take 1 tablet (40 mg total) by mouth 2 (two) times daily before a meal.   predniSONE 50 MG tablet Commonly known as: DELTASONE Take 1 tablet (50 mg total) by mouth daily.   Sensipar 30 MG tablet Generic drug: cinacalcet Take 30 mg by mouth daily.   trihexyphenidyl 2 MG tablet Commonly known as: ARTANE Take 2 mg by mouth 3 (three) times daily.      No Known Allergies Follow-up Information    [facility] [place] Call.   Specialty: Professional Counselor Why: For counseling resources Contact information: [facility] [address] [place] [address] [phone]        Zehr, Princella PellegriniJessica D, PA-C Follow up on  01/24/2019.   Specialty: Gastroenterology Why: 2:30 pm Contact information: [address] [place] [address] [phone]            The results of significant diagnostics from this hospitalization (including imaging, microbiology, ancillary and laboratory) are listed below for reference.    Significant Diagnostic Studies: Dg Elbow Complete Right (3+view)  Result Date: 12/29/2018 CLINICAL DATA:  Knee and elbow pain after fall EXAM: RIGHT ELBOW - COMPLETE  3+ VIEW COMPARISON:  11/26/2018 FINDINGS: Suspected prominent elbow effusion. No definitive fracture lucency. Degenerative changes on the radial side of the joint with chronic appearing irregularity and probable subarticular cystic change of the capitellum. Spurring at the radial head neck junction. Diffuse soft tissue swelling. IMPRESSION: 1. Diffuse soft tissue swelling with suspected elbow effusion. No definitive lucency identified 2. Arthritis of the elbow Electronically Signed   By: Jasmine Pang M.D.   On: 12/29/2018 21:08   Ct Head Wo Contrast  Result Date: 01/08/2019 CLINICAL DATA:  Altered level of consciousness, unexplained. EXAM: CT HEAD WITHOUT CONTRAST TECHNIQUE: Contiguous axial images were obtained from the base of the skull through the vertex without intravenous contrast. COMPARISON:  10/27/2010 FINDINGS: Brain: Generalized atrophy as before, no acute intracranial process, no evidence of mass, mass effect, midline shift or intracranial hemorrhage. Vascular: No hyperdense vessel or unexpected calcification. Skull: Signs of cervical-occipital fusion as before. No acute process Sinuses/Orbits: No acute finding. Other: None. IMPRESSION: 1. Generalized atrophy as before, no acute intracranial findings. Electronically Signed   By: Donzetta Kohut M.D.   On: 01/08/2019 10:31   Dg Chest Port 1 View  Result Date: 01/08/2019 CLINICAL DATA:  Hematemesis EXAM: PORTABLE CHEST 1 VIEW COMPARISON:  01/06/2019 FINDINGS: Cardiomegaly. No confluent opacities, effusions or edema. No acute bony abnormality. IMPRESSION: Cardiomegaly.  No active disease. Electronically Signed   By: Charlett Nose M.D.   On: 01/08/2019 11:30   Dg Chest Port 1 View  Result Date: 01/06/2019 CLINICAL DATA:  Left-sided chest pain. EXAM: PORTABLE CHEST 1 VIEW COMPARISON:  November 05, 2018 FINDINGS: Cardiomediastinal silhouette is stable. Mediastinal contours appear intact. Subtle peribronchial airspace opacities in bilateral lower lung  fields, may represent developing pulmonary edema or atypical consolidation. Osseous structures are without acute abnormality. Soft tissues are grossly normal. IMPRESSION: 1. Subtle peribronchial airspace opacities in bilateral lower lung fields, may represent developing pulmonary edema or atypical consolidation. Electronically Signed   By: Ted Mcalpine M.D.   On: 01/06/2019 14:24   Dg Knee Complete 4 Views Right  Result Date: 12/29/2018 CLINICAL DATA:  Fall with knee pain EXAM: RIGHT KNEE - COMPLETE 4+ VIEW COMPARISON:  04/17/2015 FINDINGS: No large knee effusion. Mild to moderate patellofemoral, medial and lateral joint space degenerative change. Articular surface irregularity at the medial tibial plateau. No dislocation IMPRESSION: 1. Articular surface irregularity at the medial tibial plateaus questionable for age indeterminate fracture 2. Tricompartment arthritis of the knee Electronically Signed   By: Jasmine Pang M.D.   On: 12/29/2018 21:10   Dg Hand Complete Left  Result Date: 01/04/2019 CLINICAL DATA:  Left hand pain and swelling following a fall yesterday. EXAM: LEFT HAND - COMPLETE 3+ VIEW COMPARISON:  Right wrist dated 09/01/2017. FINDINGS: Comminuted fracture of the proximal shaft of the 5th metacarpal with impaction. Radial and ventral angulation of the proximal fragment. Associated diffuse dorsal soft tissue swelling. IMPRESSION: Comminuted 5th metacarpal fracture with impaction and angulation, as described above. Electronically Signed   By: Beckie Salts M.D.   On: 01/04/2019 18:34  Microbiology: Recent Results (from the past 240 hour(s))  SARS CORONAVIRUS 2 (TAT 6-24 HRS) Nasopharyngeal Nasopharyngeal Swab     Status: None   Collection Time: 01/06/19  2:43 PM   Specimen: Nasopharyngeal Swab  Result Value Ref Range Status   SARS Coronavirus 2 NEGATIVE NEGATIVE Final    Comment: (NOTE) SARS-CoV-2 target nucleic acids are NOT DETECTED. The SARS-CoV-2 RNA is generally  detectable in upper and lower respiratory specimens during the acute phase of infection. Negative results do not preclude SARS-CoV-2 infection, do not rule out co-infections with other pathogens, and should not be used as the sole basis for treatment or other patient management decisions. Negative results must be combined with clinical observations, patient history, and epidemiological information. The expected result is Negative. Fact Sheet for Patients: HairSlick.nohttps://www.fda.gov/media/138098/download Fact Sheet for Healthcare Providers: quierodirigir.comhttps://www.fda.gov/media/138095/download This test is not yet approved or cleared by the [place] FDA and  has been authorized for detection and/or diagnosis of SARS-CoV-2 by FDA under an Emergency Use Authorization (EUA). This EUA will remain  in effect (meaning this test can be used) for the duration of the COVID-19 declaration under Section 56 4(b)(1) of the Act, 21 U.S.C. section 360bbb-3(b)(1), unless the authorization is terminated or revoked sooner. Performed at [facility], [address] [place], [place] [address]   SARS CORONAVIRUS 2 (TAT 6-24 HRS) Nasopharyngeal Nasopharyngeal Swab     Status: None   Collection Time: 01/08/19 10:37 AM   Specimen: Nasopharyngeal Swab  Result Value Ref Range Status   SARS Coronavirus 2 NEGATIVE NEGATIVE Final    Comment: (NOTE) SARS-CoV-2 target nucleic acids are NOT DETECTED. The SARS-CoV-2 RNA is generally detectable in upper and lower respiratory specimens during the acute phase of infection. Negative results do not preclude SARS-CoV-2 infection, do not rule out co-infections with other pathogens, and should not be used as the sole basis for treatment or other patient management decisions. Negative results must be combined with clinical observations, patient history, and epidemiological information. The expected result is Negative. Fact Sheet for Patients:  HairSlick.nohttps://www.fda.gov/media/138098/download Fact Sheet for Healthcare Providers: quierodirigir.comhttps://www.fda.gov/media/138095/download This test is not yet approved or cleared by the [place] FDA and  has been authorized for detection and/or diagnosis of SARS-CoV-2 by FDA under an Emergency Use Authorization (EUA). This EUA will remain  in effect (meaning this test can be used) for the duration of the COVID-19 declaration under Section 56 4(b)(1) of the Act, 21 U.S.C. section 360bbb-3(b)(1), unless the authorization is terminated or revoked sooner. Performed at [facility], [address] [place], [place] [address]      Labs: Basic Metabolic Panel: Recent Labs  Lab 01/06/19 1437 01/08/19 0846 01/09/19 0300 01/10/19 0352 01/11/19 0258  NA 141 142 142 144 142  K 4.1 4.6 4.8 4.1 4.1  CL 112* 114* 115* 118* 117*  CO2 21* 23 21* 17* 20*  GLUCOSE 93 103* 92 95 106*  BUN 27* 38* 49* 41* 33*  CREATININE 2.26* 2.15* 2.02* 1.87* 1.95*  CALCIUM 8.6* 8.6* 8.5* 8.6* 8.7*  MG  --   --   --   --  1.8   Liver Function Tests: Recent Labs  Lab 01/08/19 0846 01/09/19 0300  AST 20 19  ALT 11 11  ALKPHOS 56 43  BILITOT 1.7* 1.6*  PROT 4.9* 4.2*  ALBUMIN 2.5* 2.2*   No results for input(s): LIPASE, AMYLASE in the last 168 hours. Recent Labs  Lab 01/08/19 0846 01/09/19 0300  AMMONIA 113* 19  CBC: Recent Labs  Lab 01/06/19 1437 01/08/19 0846  01/09/19 0051 01/09/19 0909 01/09/19 1830 01/10/19 0352 01/10/19 0958 01/11/19 0258  WBC 5.4 9.2  --  5.5  --   --  3.0*  --   --   NEUTROABS  --  7.0  --   --   --   --   --   --   --   HGB 8.8* 8.2*   < > 5.6* 6.8* 7.5* 6.9* 7.4* 7.8*  HCT 28.8* 27.1*   < > 17.8* 21.1* 23.6* 21.2* 23.0* 24.5*  MCV 97.6 99.3  --  98.3  --   --  95.1  --   --   PLT 104* 164  --  88*  --   --  83*  --   --    < > = values in this interval not displayed.   Cardiac Enzymes: Recent Labs  Lab 01/08/19 0846  CKTOTAL 107   BNP: BNP (last 3  results) No results for input(s): BNP in the last 8760 hours.  ProBNP (last 3 results) No results for input(s): PROBNP in the last 8760 hours.  CBG: No results for input(s): GLUCAP in the last 168 hours.     Signed:  Edsel Petrin  [facility] 01/11/2019, 3:09 PM

## 2019-01-15 ENCOUNTER — Emergency Department (HOSPITAL_COMMUNITY)
Admission: EM | Admit: 2019-01-15 | Discharge: 2019-01-15 | Disposition: A | Discharge status: Home / Self Care | Payer: Medicaid Other | Attending: Emergency Medicine | Admitting: Emergency Medicine

## 2019-01-15 ENCOUNTER — Other Ambulatory Visit: Payer: Self-pay

## 2019-01-15 ENCOUNTER — Encounter (HOSPITAL_COMMUNITY): Payer: Self-pay | Admitting: Emergency Medicine

## 2019-01-15 ENCOUNTER — Emergency Department (HOSPITAL_COMMUNITY)
Admission: EM | Admit: 2019-01-15 | Discharge: 2019-01-16 | Disposition: A | Discharge status: Home / Self Care | Payer: Medicaid Other | Source: Home / Self Care | Attending: Emergency Medicine | Admitting: Emergency Medicine

## 2019-01-15 DIAGNOSIS — I471 Supraventricular tachycardia: Secondary | ICD-10-CM | POA: Diagnosis not present

## 2019-01-15 DIAGNOSIS — M1990 Unspecified osteoarthritis, unspecified site: Secondary | ICD-10-CM | POA: Insufficient documentation

## 2019-01-15 DIAGNOSIS — F17210 Nicotine dependence, cigarettes, uncomplicated: Secondary | ICD-10-CM | POA: Diagnosis not present

## 2019-01-15 DIAGNOSIS — N183 Chronic kidney disease, stage 3 unspecified: Secondary | ICD-10-CM | POA: Insufficient documentation

## 2019-01-15 DIAGNOSIS — R000 Tachycardia, unspecified: Secondary | ICD-10-CM | POA: Diagnosis present

## 2019-01-15 DIAGNOSIS — E039 Hypothyroidism, unspecified: Secondary | ICD-10-CM | POA: Insufficient documentation

## 2019-01-15 DIAGNOSIS — E232 Diabetes insipidus: Secondary | ICD-10-CM | POA: Insufficient documentation

## 2019-01-15 DIAGNOSIS — Z760 Encounter for issue of repeat prescription: Secondary | ICD-10-CM

## 2019-01-15 DIAGNOSIS — Z79899 Other long term (current) drug therapy: Secondary | ICD-10-CM | POA: Insufficient documentation

## 2019-01-15 DIAGNOSIS — F4310 Post-traumatic stress disorder, unspecified: Secondary | ICD-10-CM | POA: Insufficient documentation

## 2019-01-15 LAB — COMPREHENSIVE METABOLIC PANEL
ALT: 10 U/L (ref 0–44)
AST: 15 U/L (ref 15–41)
Albumin: 2.6 g/dL — ABNORMAL LOW (ref 3.5–5.0)
Alkaline Phosphatase: 64 U/L (ref 38–126)
Anion gap: 6 (ref 5–15)
BUN: 19 mg/dL (ref 6–20)
CO2: 19 mmol/L — ABNORMAL LOW (ref 22–32)
Calcium: 9.5 mg/dL (ref 8.9–10.3)
Chloride: 114 mmol/L — ABNORMAL HIGH (ref 98–111)
Creatinine, Ser: 1.74 mg/dL — ABNORMAL HIGH (ref 0.61–1.24)
GFR calc Af Amer: 49 mL/min — ABNORMAL LOW (ref >60)
GFR calc non Af Amer: 42 mL/min — ABNORMAL LOW (ref >60)
Glucose, Bld: 101 mg/dL — ABNORMAL HIGH (ref 70–99)
Potassium: 3.5 mmol/L (ref 3.5–5.1)
Sodium: 139 mmol/L (ref 135–145)
Total Bilirubin: 0.9 mg/dL (ref 0.3–1.2)
Total Protein: 5 g/dL — ABNORMAL LOW (ref 6.5–8.1)

## 2019-01-15 LAB — MAGNESIUM: Magnesium: 1.7 mg/dL (ref 1.7–2.4)

## 2019-01-15 LAB — CBC WITH DIFFERENTIAL/PLATELET
Abs Immature Granulocytes: 0.03 10*3/uL (ref 0.00–0.07)
Basophils Absolute: 0.1 10*3/uL (ref 0.0–0.1)
Basophils Relative: 1 %
Eosinophils Absolute: 0.3 10*3/uL (ref 0.0–0.5)
Eosinophils Relative: 4 %
HCT: 30.2 % — ABNORMAL LOW (ref 39.0–52.0)
Hemoglobin: 9.7 g/dL — ABNORMAL LOW (ref 13.0–17.0)
Immature Granulocytes: 0 %
Lymphocytes Relative: 12 %
Lymphs Abs: 1 10*3/uL (ref 0.7–4.0)
MCH: 31 pg (ref 26.0–34.0)
MCHC: 32.1 g/dL (ref 30.0–36.0)
MCV: 96.5 fL (ref 80.0–100.0)
Monocytes Absolute: 0.5 10*3/uL (ref 0.1–1.0)
Monocytes Relative: 7 %
Neutro Abs: 5.9 10*3/uL (ref 1.7–7.7)
Neutrophils Relative %: 76 %
Platelets: 141 10*3/uL — ABNORMAL LOW (ref 150–400)
RBC: 3.13 MIL/uL — ABNORMAL LOW (ref 4.22–5.81)
RDW: 18.3 % — ABNORMAL HIGH (ref 11.5–15.5)
WBC: 7.9 10*3/uL (ref 4.0–10.5)
nRBC: 0 % (ref 0.0–0.2)

## 2019-01-15 LAB — PROTIME-INR
INR: 1.3 — ABNORMAL HIGH (ref 0.8–1.2)
Prothrombin Time: 15.9 seconds — ABNORMAL HIGH (ref 11.4–15.2)

## 2019-01-15 MED ORDER — OXYCODONE HCL 5 MG PO TABS
10.0000 mg | ORAL_TABLET | Freq: Three times a day (TID) | ORAL | Status: DC
  Administered 2019-01-15: 10 mg via ORAL
  Filled 2019-01-15: qty 2

## 2019-01-15 MED ORDER — OXYCODONE HCL 10 MG PO TABS: 10.0000 mg | ORAL_TABLET | Freq: Three times a day (TID) | ORAL | Status: DC

## 2019-01-15 NOTE — ED Provider Notes (Signed)
[facility] EMERGENCY DEPARTMENT Provider Note   CSN: [id number] Arrival date & time: 01/15/19  1217     History   Chief Complaint Chief Complaint  Patient presents with  . SVT    HPI Chrishon Martino is a 58 y.o. male.     HPI Patient has a history of multiple medical problems.  He has history of chronic pain syndrome, cirrhosis, prior GI bleed, hep C as well as mental health problems.  Patient states today he is here for an episode of tachycardia.  Patient felt like his heart was racing.  He was upstairs in his house and walked down the stairs and the symptoms persisted.  He did not have any chest pain.  He felt weak and lightheaded.  EMS was called and he was noted to be in a narrow complex tachycardia consistent with SVT.  Patient was given 6 mg of adenosine with resolution back to a sinus rhythm.  She states he is feeling better at this time.  He denies any fevers or chills.  No chest pain or shortness of breath.  No vomiting or diarrhea. Past Medical History:  Diagnosis Date  . Anemia   . Ascites   . Chronic leg pain   . Cirrhosis (HCC)   . GERD (gastroesophageal reflux disease)   . H/O diabetes insipidus   . Hepatitis C    Completed therapy with Epclusa ~ 2018.  suspect therapy overseen by [facility] in [place], [place]  . History of adrenal insufficiency   . History of ARDS   . History of blood transfusion   . Hypothyroidism   . Kidney disease    stage 3  . Neuropathy   . Overdose 12/2009  . Peripheral neuropathy   . Personality disorder (HCC)   . PTSD (post-traumatic stress disorder)    SECONDARY TO WAR IN [place]  . Schizoaffective disorder [facility])     Patient Active Problem List   Diagnosis Date Noted  . Acute gastric ulcer without hemorrhage or perforation   . Duodenal ulcer   . Acute esophagitis   . Acute blood loss anemia   . Hematemesis 01/08/2019  . Prolonged QT interval 01/08/2019  . Esophageal varices in cirrhosis (HCC)  01/08/2019  . CKD (chronic kidney disease) stage 3, GFR 30-59 ml/min 01/08/2019  . Suicidal ideation 01/08/2019  . Closed displaced fracture of neck of left fifth metacarpal bone 01/08/2019  . GI bleed 08/07/2018  . Acute abdominal pain   . Cirrhosis of liver with ascites (HCC)   . Generalized abdominal pain   . Hematemesis with nausea   . Evaluation by psychiatric service required   . Acute upper gastrointestinal bleeding 10/15/2017  . PTSD (post-traumatic stress disorder)   . Hypothyroidism   . History of adrenal insufficiency   . H/O diabetes insipidus   . Schizoaffective disorder (HCC) 11/08/2011  . Personality disorder (HCC)   . Normocytic anemia   . Ascites   . Liver disease   . Hepatitis C   . Kidney disease   . GERD (gastroesophageal reflux disease)     Past Surgical History:  Procedure Laterality Date  . BIOPSY  01/10/2019   Procedure: BIOPSY;  Surgeon: Beverley Fiedler, MD;  Location: [facility] ENDOSCOPY;  Service: Endoscopy;;  . CERVICAL SPINE SURGERY  unknown  . ESOPHAGOGASTRODUODENOSCOPY (EGD) WITH PROPOFOL N/A 01/10/2019   Procedure: ESOPHAGOGASTRODUODENOSCOPY (EGD) WITH PROPOFOL;  Surgeon: Beverley Fiedler, MD;  Location: [facility] ENDOSCOPY;  Service: Endoscopy;  Laterality:  N/A;  . [place]  . US GUIDED LEFT THORACENTESIS  01/21/2010        Home Medications    Prior to Admission medications   Medication Sig Start Date End Date Taking? Authorizing Provider  [facility] D3 2000 units CAPS Take 2,000 Units by mouth daily. 10/23/17   [provider]  diphenhydrAMINE (BENADRYL) 25 MG tablet Take 25 mg by mouth at bedtime.     [provider]  gabapentin (NEURONTIN) 100 MG capsule Take 200 mg by mouth 3 (three) times daily. 07/21/18   [provider]  haloperidol (HALDOL) 2 MG tablet Take 1 tablet (2 mg total) by mouth 2 (two) times daily. 01/11/19   Mikhail, Velta Addison, DO  lactulose (CHRONULAC) 10 GM/15ML solution Take 45 mLs (30 g total) by mouth  2 (two) times daily as needed (To have at least 2-3 loose bowel movements per day.). 01/11/19   Cristal Ford, DO  Oxycodone HCl 10 MG TABS Take 10 mg by mouth every 8 (eight) hours. 10/18/18   [provider]  pantoprazole (PROTONIX) 40 MG tablet Take 1 tablet (40 mg total) by mouth 2 (two) times daily before a meal. 01/11/19   Mikhail, Velta Addison, DO  predniSONE (DELTASONE) 50 MG tablet Take 1 tablet (50 mg total) by mouth daily. 11/26/18   Lawyer, Christopher, PA-C  SENSIPAR 30 MG tablet Take 30 mg by mouth daily. 07/25/17   [provider]  trihexyphenidyl (ARTANE) 2 MG tablet Take 2 mg by mouth 3 (three) times daily.    [provider]  colchicine 0.6 MG tablet Take 1 tablet (0.6 mg total) by mouth daily. Patient not taking: Reported on 11/05/2018 08/26/18 12/31/18  Eustaquio Maize, PA-C    Family History Family History  Problem Relation Age of Onset  . Cancer Mother   . Heart attack Father     Social History Social History   Tobacco Use  . Smoking status: Current Every Day Smoker    Packs/day: 1.00    Years: 25.00    Pack years: 25.00    Types: Cigarettes  . Smokeless tobacco: Never Used  Substance Use Topics  . Alcohol use: No    Comment: Pt denies   . Drug use: No    Comment: Pt denies     Allergies   Patient has no known allergies.   Review of Systems Review of Systems  Musculoskeletal: Positive for arthralgias.  All other systems reviewed and are negative.    Physical Exam Updated Vital Signs BP (!) 102/59   Pulse 66   Temp 98.5 F (36.9 C) (Oral)   Resp 20   SpO2 99%   Physical Exam Vitals signs and nursing note reviewed.  Constitutional:      General: He is not in acute distress.    Appearance: He is well-developed.  HENT:     Head: Normocephalic and atraumatic.     Right Ear: External ear normal.     Left Ear: External ear normal.  Eyes:     General: No scleral icterus.       Right eye: No discharge.        Left eye:  No discharge.     Conjunctiva/sclera: Conjunctivae normal.  Neck:     Musculoskeletal: Neck supple.     Trachea: No tracheal deviation.  Cardiovascular:     Rate and Rhythm: Normal rate and regular rhythm.  Pulmonary:     Effort: Pulmonary effort is normal. No respiratory distress.  Breath sounds: Normal breath sounds. No stridor. No wheezing or rales.     Comments: Barrel chested Abdominal:     General: Bowel sounds are normal. There is no distension.     Palpations: Abdomen is soft.     Tenderness: There is no abdominal tenderness. There is no guarding or rebound.  Musculoskeletal:        General: No tenderness.     Comments: Edema of left hand noted  Skin:    General: Skin is warm and dry.     Findings: Bruising present. No rash.     Comments: Areas of bruising noted on the extremities in various stages  Neurological:     Mental Status: He is alert.     Cranial Nerves: No cranial nerve deficit (no facial droop, extraocular movements intact, no slurred speech).     Sensory: No sensory deficit.     Motor: No abnormal muscle tone or seizure activity.     Coordination: Coordination normal.  Psychiatric:        Mood and Affect: Affect is blunt.        Speech: Speech is not delayed or tangential.        Behavior: Behavior is not aggressive or hyperactive.        Thought Content: Thought content does not include homicidal or suicidal ideation.      ED Treatments / Results  Labs (all labs ordered are listed, but only abnormal results are displayed) Labs Reviewed  CBC WITH DIFFERENTIAL/PLATELET - Abnormal; Notable for the following components:      Result Value   RBC 3.13 (*)    Hemoglobin 9.7 (*)    HCT 30.2 (*)    RDW 18.3 (*)    Platelets 141 (*)    All other components within normal limits  COMPREHENSIVE METABOLIC PANEL - Abnormal; Notable for the following components:   Chloride 114 (*)    CO2 19 (*)    Glucose, Bld 101 (*)    Creatinine, Ser 1.74 (*)    Total  Protein 5.0 (*)    Albumin 2.6 (*)    GFR calc non Af Amer 42 (*)    GFR calc Af Amer 49 (*)    All other components within normal limits  PROTIME-INR - Abnormal; Notable for the following components:   Prothrombin Time 15.9 (*)    INR 1.3 (*)    All other components within normal limits  MAGNESIUM  RAPID URINE DRUG SCREEN, HOSP PERFORMED    EKG EKG Interpretation  Date/Time:  Monday January 15 2019 12:48:59 EDT Ventricular Rate:  75 PR Interval:    QRS Duration: 92 QT Interval:  374 QTC Calculation: 418 R Axis:   50 Text Interpretation:  Sinus rhythm Abnormal R-wave progression, early transition Baseline wander in lead(s) V4 V6 No significant change since last tracing Confirmed by Linwood DibblesKnapp, Kween Bacorn [phone]([id number]) on 01/15/2019 3:30:14 PM   Radiology No results found.  Procedures Procedures (including critical care time)  Medications Ordered in ED Medications  oxyCODONE (Oxy IR/ROXICODONE) immediate release tablet 10 mg (10 mg Oral Given 01/15/19 1430)     Initial Impression / Assessment and Plan / ED Course  I have reviewed the triage vital signs and the nursing notes.  Pertinent labs & imaging results that were available during my care of the patient were reviewed by me and considered in my medical decision making (see chart for details).  Clinical Course as of Jan 14 1529  Mon Jan 15, 2019  1529 Comprehensive metabolic panel stable.   [JK]  1529 Anemia noted on CBC but improved from recent values.   [JK]    Clinical Course User Index [JK] Linwood Dibbles, MD  Patient has history of multiple medical problems including PTSD and cirrhosis related to hepatitis C.  Patient had a narrow complex tachycardia as demonstrated by the EMS EKG.  Patient was given 6 of adenosine with resolution.  Patient remained stable in the ED.  No recurrent tachycardia.  His blood pressure is at baseline low.  Hesitate to start him on a beta-blocker.  I will refer him to cardiology for further evaluation as  an outpatient.  Final Clinical Impressions(s) / ED Diagnoses   Final diagnoses:  PSVT (paroxysmal supraventricular tachycardia) [facility])    ED Discharge Orders    None       Linwood Dibbles, MD 01/15/19 1531

## 2019-01-15 NOTE — Discharge Instructions (Signed)
Continue your medications, follow up with the cardiology doctor for further evaluation, return as needed for worsening symptoms

## 2019-01-15 NOTE — ED Triage Notes (Signed)
Pt arrives to ED from home with complaints of heart palpations starting this morning. EMS reports SVT with HR up to 170's. EMS gave 6mg  adenosine and patient converted to NSR with HR in 90's.

## 2019-01-15 NOTE — ED Triage Notes (Signed)
Patient brought from home from [facility]. Patient complaining of PTSD and chronic body pain.

## 2019-01-15 NOTE — ED Notes (Signed)
Pt d/c home per MD order. Taxi cab called by nursing staff per pt request. Discharge summary reviewed, pt verbalizes understanding. Off unit via [facility] with nursing staff, taxi cab here for pt.

## 2019-01-16 ENCOUNTER — Telehealth: Payer: Self-pay | Admitting: *Deleted

## 2019-01-16 LAB — CBC

## 2019-01-16 LAB — COMPREHENSIVE METABOLIC PANEL

## 2019-01-16 LAB — SALICYLATE LEVEL

## 2019-01-16 LAB — ETHANOL

## 2019-01-16 LAB — ACETAMINOPHEN LEVEL

## 2019-01-16 MED ORDER — TRIHEXYPHENIDYL HCL 2 MG PO TABS: 2.0000 mg | ORAL_TABLET | Freq: Three times a day (TID) | ORAL | 0 refills | Status: DC

## 2019-01-16 NOTE — Telephone Encounter (Addendum)
TOC CM contacted pt and pt hung up phone. Will attempt to call again. Jonnie Finner RN CCM, [facility] ED TOC CM [phone]  1430 Was able to reach pt on phone. Pt states he has Case Manager with [employer]) # [phone], Jeananne Rama [phone] [facility] [employer] [facility], states they are assisting him with transportation, appts and medications. Contacted Constance Holster and states the office can put his appt on the calendar for Oct 28 with GI, Alonza Bogus PA. Pt has a team that is assisting him with different services including transportation. They have Housing Specialist and they are going to see if pt can get into an ALF. Pt is needing more care in the home. Made CM aware that pt had ED visit 01/16/2019. ED provider, Cortni Courture PA updated.  [place], [place] ED TOC CM [phone]

## 2019-01-16 NOTE — ED Notes (Signed)
[facility] called for patient per request

## 2019-01-16 NOTE — Discharge Instructions (Addendum)
[facility] [place] will need to contact your regular doctor in regards to filling your prescription for oxycodone.   Please follow up with your primary care provider within 5-7 days for re-evaluation of your symptoms.   Please return to the emergency department for any new or worsening symptoms.

## 2019-01-16 NOTE — ED Provider Notes (Signed)
[facility]-EMERGENCY DEPT Provider Note   CSN: [id number] Arrival date & time: 01/15/19  2242     History   Chief Complaint Chief Complaint  Patient presents with  . Post-Traumatic Stress Disorder    HPI [name] is a 58 y.o. male.     HPI   Patient is a 58 year old male with history of anemia, cirrhosis, GERD, diabetes insipidus, hep C, adrenal insufficiency, hypothyroidism, nephrolithiasis, PTSD, schizoaffective disorder, who presents to the emergency department today for medication refill.  Patient states that he had been on oxycodone for chronic arthritis and was on Haldol for his PTSD.  States he ran out of these medications about 2 weeks ago and has been unable to fill prescription for this.  States he has been trying make an appoint with his PCP but has not been able to get to it due to transportation issues.  He denies any current SI, HI or AVH.  Past Medical History:  Diagnosis Date  . Anemia   . Ascites   . Chronic leg pain   . Cirrhosis (HCC)   . GERD (gastroesophageal reflux disease)   . H/O diabetes insipidus   . Hepatitis C    Completed therapy with Epclusa ~ 2018.  suspect therapy overseen by [facility] in [place], [place]  . History of adrenal insufficiency   . History of ARDS   . History of blood transfusion   . Hypothyroidism   . Kidney disease    stage 3  . Neuropathy   . Overdose 12/2009  . Peripheral neuropathy   . Personality disorder (HCC)   . PTSD (post-traumatic stress disorder)    SECONDARY TO WAR IN [place]  . Schizoaffective disorder [facility])     Patient Active Problem List   Diagnosis Date Noted  . Acute gastric ulcer without hemorrhage or perforation   . Duodenal ulcer   . Acute esophagitis   . Acute blood loss anemia   . Hematemesis 01/08/2019  . Prolonged QT interval 01/08/2019  . Esophageal varices in cirrhosis (HCC) 01/08/2019  . CKD (chronic kidney disease) stage 3, GFR 30-59 ml/min  01/08/2019  . Suicidal ideation 01/08/2019  . Closed displaced fracture of neck of left fifth metacarpal bone 01/08/2019  . GI bleed 08/07/2018  . Acute abdominal pain   . Cirrhosis of liver with ascites (HCC)   . Generalized abdominal pain   . Hematemesis with nausea   . Evaluation by psychiatric service required   . Acute upper gastrointestinal bleeding 10/15/2017  . PTSD (post-traumatic stress disorder)   . Hypothyroidism   . History of adrenal insufficiency   . H/O diabetes insipidus   . Schizoaffective disorder (HCC) 11/08/2011  . Personality disorder (HCC)   . Normocytic anemia   . Ascites   . Liver disease   . Hepatitis C   . Kidney disease   . GERD (gastroesophageal reflux disease)     Past Surgical History:  Procedure Laterality Date  . BIOPSY  01/10/2019   Procedure: BIOPSY;  Surgeon: Beverley Fiedler, MD;  Location: [facility] ENDOSCOPY;  Service: Endoscopy;;  . CERVICAL SPINE SURGERY  unknown  . ESOPHAGOGASTRODUODENOSCOPY (EGD) WITH PROPOFOL N/A 01/10/2019   Procedure: ESOPHAGOGASTRODUODENOSCOPY (EGD) WITH PROPOFOL;  Surgeon: Beverley Fiedler, MD;  Location: [facility] ENDOSCOPY;  Service: Endoscopy;  Laterality: N/A;  . RIGHT HIP SURGERY  1996  . US GUIDED LEFT THORACENTESIS  01/21/2010        Home Medications    Prior to Admission  medications   Medication Sig Start Date End Date Taking? Authorizing Provider  [facility] D3 2000 units CAPS Take 2,000 Units by mouth daily. 10/23/17  Yes [provider]  diphenhydrAMINE (BENADRYL) 25 MG tablet Take 25 mg by mouth at bedtime.    Yes [provider]  gabapentin (NEURONTIN) 100 MG capsule Take 200 mg by mouth 3 (three) times daily. 07/21/18  Yes [provider]  haloperidol (HALDOL) 2 MG tablet Take 1 tablet (2 mg total) by mouth 2 (two) times daily. 01/11/19  Yes Mikhail, Maryann, DO  lactulose (CHRONULAC) 10 GM/15ML solution Take 45 mLs (30 g total) by mouth 2 (two) times daily as needed (To have at least 2-3 loose  bowel movements per day.). 01/11/19  Yes Mikhail, Nita SellsMaryann, DO  Oxycodone HCl 10 MG TABS Take 10 mg by mouth every 8 (eight) hours. 10/18/18  Yes [provider]  pantoprazole (PROTONIX) 40 MG tablet Take 1 tablet (40 mg total) by mouth 2 (two) times daily before a meal. 01/11/19  Yes Mikhail, Maryann, DO  SENSIPAR 30 MG tablet Take 30 mg by mouth daily. 07/25/17  Yes [provider]  predniSONE (DELTASONE) 50 MG tablet Take 1 tablet (50 mg total) by mouth daily. Patient not taking: Reported on 01/16/2019 11/26/18   Charlestine NightLawyer, Christopher, PA-C  trihexyphenidyl (ARTANE) 2 MG tablet Take 1 tablet (2 mg total) by mouth 3 (three) times daily for 14 days. 01/16/19 01/30/19  Zylie Mumaw S, PA-C  colchicine 0.6 MG tablet Take 1 tablet (0.6 mg total) by mouth daily. Patient not taking: Reported on 11/05/2018 08/26/18 12/31/18  Tanda RockersVenter, Margaux, PA-C    Family History Family History  Problem Relation Age of Onset  . Cancer Mother   . Heart attack Father     Social History Social History   Tobacco Use  . Smoking status: Current Every Day Smoker    Packs/day: 1.00    Years: 25.00    Pack years: 25.00    Types: Cigarettes  . Smokeless tobacco: Never Used  Substance Use Topics  . Alcohol use: No    Comment: Pt denies   . Drug use: No    Comment: Pt denies     Allergies   Patient has no known allergies.   Review of Systems Review of Systems  Constitutional: Negative for fever.  HENT: Negative for congestion.   Eyes: Negative for visual disturbance.  Respiratory: Negative for shortness of breath.   Cardiovascular: Negative for chest pain.  Gastrointestinal: Negative for abdominal pain and diarrhea.  Genitourinary: Negative for dysuria.  Musculoskeletal:       Chronic arthritis  Skin: Negative for rash.  Neurological: Negative for headaches.  Psychiatric/Behavioral:       No SI, HI, or AVH     Physical Exam Updated Vital Signs BP 133/84 (BP Location: Left Arm)    Pulse 74   Temp 98.4 F (36.9 C) (Oral)   Resp 18   Ht 6\' 6"  (1.981 m)   Wt 99.8 kg   SpO2 99%   BMI 25.43 kg/m   Physical Exam Vitals signs and nursing note reviewed.  Constitutional:      Appearance: He is well-developed.  HENT:     Head: Normocephalic and atraumatic.  Eyes:     Conjunctiva/sclera: Conjunctivae normal.  Neck:     Musculoskeletal: Neck supple.  Cardiovascular:     Rate and Rhythm: Normal rate.     Heart sounds: No murmur.  Pulmonary:     Effort:  Pulmonary effort is normal.  Abdominal:     General: Abdomen is flat.  Skin:    General: Skin is warm and dry.  Neurological:     Mental Status: He is alert.  Psychiatric:     Comments: Denies SI, HI or AVH     ED Treatments / Results  Labs (all labs ordered are listed, but only abnormal results are displayed) Labs Reviewed  COMPREHENSIVE METABOLIC PANEL  ETHANOL  SALICYLATE LEVEL  ACETAMINOPHEN LEVEL  CBC    EKG None  Radiology No results found.  Procedures Procedures (including critical care time)  Medications Ordered in ED Medications - No data to display   Initial Impression / Assessment and Plan / ED Course  I have reviewed the triage vital signs and the nursing notes.  Pertinent labs & imaging results that were available during my care of the patient were reviewed by me and considered in my medical decision making (see chart for details).     Final Clinical Impressions(s) / ED Diagnoses   Final diagnoses:  Medication refill   Patient is a 58 year old male with history of anemia, cirrhosis, GERD, diabetes insipidus, hep C, adrenal insufficiency, hypothyroidism, nephrolithiasis, PTSD, schizoaffective disorder, who presents to the emergency department today for medication refill.  Patient states that he had been on oxycodone for chronic arthritis and was on Haldol for his PTSD.  States he ran out of these medications about 2 weeks ago and has been unable to fill prescription for  this.  States he has been trying make an appoint with his PCP but has not been able to get to it due to transportation issues.  He denies any current SI, HI or AVH.  Pt  shows no indication that he is psychiatrically unstable at this time, he is just requesting a refill of his medication. I reviewed his chart and he does have an active prescription for the medication he is requesting. I let him know where the prescription is at. I advised that he will need to f/u with his regular doctor for a new rx for his chronic pain medications.  He is also requesting Rx for his Artane, reviewed his discharge summary from 01/07/2019.  It was recommended that he continue taking 2 mg 3 times daily.  This Rx was filled for him.  I placed a consult for social work and case management to see if they can help with transportation. Pt does not appear to have an emergent medical issue at this time. He was d/c in stable condition.   ED Discharge Orders         Ordered    trihexyphenidyl (ARTANE) 2 MG tablet  3 times daily     01/16/19 0027           Rodney Booze, PA-C 01/16/19 0029    Veryl Speak, MD 01/16/19 [phone]

## 2019-01-21 ENCOUNTER — Encounter (HOSPITAL_COMMUNITY): Payer: Self-pay | Admitting: Emergency Medicine

## 2019-01-21 ENCOUNTER — Other Ambulatory Visit: Payer: Self-pay

## 2019-01-21 ENCOUNTER — Emergency Department (HOSPITAL_COMMUNITY)
Admission: EM | Admit: 2019-01-21 | Discharge: 2019-01-22 | Disposition: A | Discharge status: Home / Self Care | Payer: Medicaid Other | Attending: Emergency Medicine | Admitting: Emergency Medicine

## 2019-01-21 DIAGNOSIS — Z5321 Procedure and treatment not carried out due to patient leaving prior to being seen by health care provider: Secondary | ICD-10-CM | POA: Insufficient documentation

## 2019-01-21 DIAGNOSIS — M7918 Myalgia, other site: Secondary | ICD-10-CM | POA: Diagnosis present

## 2019-01-21 NOTE — ED Triage Notes (Signed)
Per EMS- pt arrives for arthritic pain and states his oxycodone is out.

## 2019-01-22 NOTE — ED Notes (Signed)
Pt requested for this tech to assist him in calling a taxi. Pt stated he could not wait any longer. Pt requested to wait for taxi outside and is currently sitting outside waiting for the taxi.

## 2019-01-24 ENCOUNTER — Encounter: Payer: Self-pay | Admitting: Gastroenterology

## 2019-01-24 ENCOUNTER — Ambulatory Visit (INDEPENDENT_AMBULATORY_CARE_PROVIDER_SITE_OTHER): Payer: Medicaid Other | Admitting: Gastroenterology

## 2019-01-24 VITALS — BP 90/60 | HR 70 | Temp 97.4°F

## 2019-01-24 DIAGNOSIS — K253 Acute gastric ulcer without hemorrhage or perforation: Secondary | ICD-10-CM

## 2019-01-24 DIAGNOSIS — D62 Acute posthemorrhagic anemia: Secondary | ICD-10-CM | POA: Diagnosis not present

## 2019-01-24 DIAGNOSIS — K922 Gastrointestinal hemorrhage, unspecified: Secondary | ICD-10-CM

## 2019-01-24 NOTE — Progress Notes (Signed)
01/24/2019 [name] [id number] Aug 09, 1960   HISTORY OF PRESENT ILLNESS:  This is a 58 year old male with recent hospitalization for upper GI bleed.  Here for hospital follow-up.  Found to have a 5 cm gastric ulcer on EGD.  Received 2 units of packed red blood cells for acute blood loss anemia.  Has been on pantoprazole 40 mg twice daily.  Plan is for repeat EGD 12 weeks out from his initial procedure.   EGD on 10/14 showed LA grade D esophagitis, 2 cm hiatal hernia, a 5 cm nonbleeding gastric ulcer, and nonbleeding duodenal ulcers.  He says that he feels well.  No more evidence of bleeding.  Most recent labs from October 19 showed hemoglobin of 9.7 g.   Past Medical History:  Diagnosis Date  . Anemia   . Ascites   . Chronic leg pain   . Cirrhosis (HCC)   . GERD (gastroesophageal reflux disease)   . H/O diabetes insipidus   . Hepatitis C    Completed therapy with Epclusa ~ 2018.  suspect therapy overseen by [facility] in [place], [place]  . History of adrenal insufficiency   . History of ARDS   . History of blood transfusion   . Hypothyroidism   . Kidney disease    stage 3  . Neuropathy   . Overdose 12/2009  . Peripheral neuropathy   . Personality disorder (HCC)   . PTSD (post-traumatic stress disorder)    SECONDARY TO WAR IN [place]  . Schizoaffective disorder [facility])    Past Surgical History:  Procedure Laterality Date  . BIOPSY  01/10/2019   Procedure: BIOPSY;  Surgeon: Beverley Fiedler, MD;  Location: [facility] ENDOSCOPY;  Service: Endoscopy;;  . CERVICAL SPINE SURGERY  unknown  . ESOPHAGOGASTRODUODENOSCOPY (EGD) WITH PROPOFOL N/A 01/10/2019   Procedure: ESOPHAGOGASTRODUODENOSCOPY (EGD) WITH PROPOFOL;  Surgeon: Beverley Fiedler, MD;  Location: [facility] ENDOSCOPY;  Service: Endoscopy;  Laterality: N/A;  . RIGHT HIP SURGERY  1996  . US GUIDED LEFT THORACENTESIS  01/21/2010    reports that he has been smoking cigarettes. He has a 25.00 pack-year smoking history. He has  never used smokeless tobacco. He reports that he does not drink alcohol or use drugs. family history includes Cancer in his mother; Heart attack in his father. No Known Allergies    Outpatient Encounter Medications as of 01/24/2019  Medication Sig  . CVS D3 2000 units CAPS Take 2,000 Units by mouth daily.  . diphenhydrAMINE (BENADRYL) 25 MG tablet Take 25 mg by mouth at bedtime.   . gabapentin (NEURONTIN) 100 MG capsule Take 200 mg by mouth 3 (three) times daily.  . haloperidol (HALDOL) 2 MG tablet Take 1 tablet (2 mg total) by mouth 2 (two) times daily.  Marland Kitchen lactulose (CHRONULAC) 10 GM/15ML solution Take 45 mLs (30 g total) by mouth 2 (two) times daily as needed (To have at least 2-3 loose bowel movements per day.).  Marland Kitchen Oxycodone HCl 10 MG TABS Take 10 mg by mouth every 8 (eight) hours.  . pantoprazole (PROTONIX) 40 MG tablet Take 1 tablet (40 mg total) by mouth 2 (two) times daily before a meal.  . predniSONE (DELTASONE) 50 MG tablet Take 1 tablet (50 mg total) by mouth daily.  . SENSIPAR 30 MG tablet Take 30 mg by mouth daily.  . trihexyphenidyl (ARTANE) 2 MG tablet Take 1 tablet (2 mg total) by mouth 3 (three) times daily for 14 days.  . [DISCONTINUED] colchicine 0.6 MG tablet  Take 1 tablet (0.6 mg total) by mouth daily. (Patient not taking: Reported on 11/05/2018)   No facility-administered encounter medications on file as of 01/24/2019.      REVIEW OF SYSTEMS  : All other systems reviewed and negative except where noted in the History of Present Illness.   PHYSICAL EXAM: BP 90/60   Pulse 70   Temp (!) 97.4 F (36.3 C)  General: Well developed white male in no acute distress Head: Normocephalic and atraumatic Eyes:  Sclerae anicteric, conjunctiva pink. Ears: Normal auditory acuity Lungs: Clear throughout to auscultation; no increased WOB. Heart: Regular rate and rhythm; no M/R/G. Abdomen: Soft, non-distended.  BS present.  Non-tender. Musculoskeletal: Symmetrical with no gross  deformities  Skin: No lesions on visible extremities Extremities: No edema  Neurological: Alert oriented x 4, grossly non-focal Psychological:  Alert and cooperative. Normal mood and affect  ASSESSMENT AND PLAN: *58 year old male with recent hospitalization for upper GI bleed.  Found to have a 5 cm gastric ulcer on EGD.  Received 2 units of packed red blood cells for acute blood loss anemia.  Has been on pantoprazole 40 mg twice daily.  Needs to continue this.  Plan is for repeat EGD 12 weeks out from his initial procedure.  We will schedule this with Dr. Hilarie Fredrickson. *History of cirrhosis of the liver due to hepatitis C, which was eradicated by [place] in 2018.  Most recent labs appear stable.  Follows with [employer] periodically as well.  **The risks, benefits, and alternatives to EGD were discussed with the patient and he consents to proceed.    CC:  Nolene Ebbs, MD

## 2019-01-24 NOTE — Patient Instructions (Signed)
If you are age 58 or younger, your body mass index should be between 19-25. Your There is no height or weight on file to calculate BMI. If this is out of the aformentioned range listed, please consider follow up with your Primary Care Provider.   It has been recommended to you by your physician that you have a(n) Endo/LEC completed. We did not schedule the procedure(s) today. Please contact our office in Dec 2020 at [phone] should you decide to have the procedure completed. You will be scheduled for a pre-visit and procedure at that time.  Continue Pantoprazole twice daily as directed.   Thank you for choosing me and [facility].  Janett Billow Zehr-PA

## 2019-01-25 NOTE — Progress Notes (Signed)
Addendum: Reviewed and agree with assessment and management plan. Bryant Saye M, MD  

## 2019-02-05 ENCOUNTER — Emergency Department (HOSPITAL_COMMUNITY)
Admission: EM | Admit: 2019-02-05 | Discharge: 2019-02-05 | Disposition: A | Discharge status: Home / Self Care | Payer: Medicaid Other | Attending: Emergency Medicine | Admitting: Emergency Medicine

## 2019-02-05 DIAGNOSIS — F259 Schizoaffective disorder, unspecified: Secondary | ICD-10-CM | POA: Diagnosis not present

## 2019-02-05 DIAGNOSIS — Z760 Encounter for issue of repeat prescription: Secondary | ICD-10-CM | POA: Diagnosis not present

## 2019-02-05 DIAGNOSIS — E119 Type 2 diabetes mellitus without complications: Secondary | ICD-10-CM | POA: Diagnosis not present

## 2019-02-05 DIAGNOSIS — F17210 Nicotine dependence, cigarettes, uncomplicated: Secondary | ICD-10-CM | POA: Diagnosis not present

## 2019-02-05 DIAGNOSIS — E039 Hypothyroidism, unspecified: Secondary | ICD-10-CM | POA: Diagnosis not present

## 2019-02-05 DIAGNOSIS — N183 Chronic kidney disease, stage 3 unspecified: Secondary | ICD-10-CM | POA: Insufficient documentation

## 2019-02-05 DIAGNOSIS — Z79899 Other long term (current) drug therapy: Secondary | ICD-10-CM | POA: Insufficient documentation

## 2019-02-05 DIAGNOSIS — F4310 Post-traumatic stress disorder, unspecified: Secondary | ICD-10-CM | POA: Insufficient documentation

## 2019-02-05 MED ORDER — OXYCODONE HCL 5 MG PO TABS
10.0000 mg | ORAL_TABLET | Freq: Once | ORAL | Status: AC
  Administered 2019-02-05: 18:00:00 10 mg via ORAL
  Filled 2019-02-05: qty 2

## 2019-02-05 NOTE — ED Triage Notes (Signed)
Pt here from home requesting pain meds that he has ran out of for his chronic arthritis

## 2019-02-05 NOTE — ED Notes (Signed)
Patient verbalizes understanding of discharge instructions. Opportunity for questioning and answers were provided. Armband removed by staff, pt discharged from ED.  

## 2019-02-05 NOTE — ED Provider Notes (Signed)
[facility] EMERGENCY DEPARTMENT Provider Note   CSN: [id number] Arrival date & time: 02/05/19  1443     History   Chief Complaint No chief complaint on file.   HPI [name] is a 58 y.o. male with history of PTSD, schizoaffective disorder, hypothyroidism, hepatitis C, chronic arthritis on chronic oxycodone who presents for refill of oxycodone.  He reports his pain has been increased because he has been out of bed over the past few days.  He reports his doctor is aware of this.  He denies any other symptoms including fever, chest pain, shortness of breath, abdominal pain, nausea, vomiting, SI, HI, AVH.     HPI  Past Medical History:  Diagnosis Date  . Anemia   . Ascites   . Chronic leg pain   . Cirrhosis (HCC)   . GERD (gastroesophageal reflux disease)   . H/O diabetes insipidus   . Hepatitis C    Completed therapy with Epclusa ~ 2018.  suspect therapy overseen by [facility] clinic in [place], [place]  . History of adrenal insufficiency   . History of ARDS   . History of blood transfusion   . Hypothyroidism   . Kidney disease    stage 3  . Neuropathy   . Overdose 12/2009  . Peripheral neuropathy   . Personality disorder (HCC)   . PTSD (post-traumatic stress disorder)    SECONDARY TO WAR IN [place]  . Schizoaffective disorder [facility])     Patient Active Problem List   Diagnosis Date Noted  . Acute gastric ulcer   . Duodenal ulcer   . Acute esophagitis   . Acute blood loss anemia   . Hematemesis 01/08/2019  . Prolonged QT interval 01/08/2019  . Esophageal varices in cirrhosis (HCC) 01/08/2019  . CKD (chronic kidney disease) stage 3, GFR 30-59 ml/min 01/08/2019  . Suicidal ideation 01/08/2019  . Closed displaced fracture of neck of left fifth metacarpal bone 01/08/2019  . UGIB (upper gastrointestinal bleed) 08/07/2018  . Acute abdominal pain   . Cirrhosis of liver with ascites (HCC)   . Generalized abdominal pain   . Hematemesis with  nausea   . Evaluation by psychiatric service required   . Acute upper gastrointestinal bleeding 10/15/2017  . PTSD (post-traumatic stress disorder)   . Hypothyroidism   . History of adrenal insufficiency   . H/O diabetes insipidus   . Schizoaffective disorder (HCC) 11/08/2011  . Personality disorder (HCC)   . Normocytic anemia   . Ascites   . Liver disease   . Hepatitis C   . Kidney disease   . GERD (gastroesophageal reflux disease)     Past Surgical History:  Procedure Laterality Date  . BIOPSY  01/10/2019   Procedure: BIOPSY;  Surgeon: Beverley Fiedler, MD;  Location: [facility] ENDOSCOPY;  Service: Endoscopy;;  . CERVICAL SPINE SURGERY  unknown  . ESOPHAGOGASTRODUODENOSCOPY (EGD) WITH PROPOFOL N/A 01/10/2019   Procedure: ESOPHAGOGASTRODUODENOSCOPY (EGD) WITH PROPOFOL;  Surgeon: Beverley Fiedler, MD;  Location: [facility] ENDOSCOPY;  Service: Endoscopy;  Laterality: N/A;  . RIGHT HIP SURGERY  1996  . US GUIDED LEFT THORACENTESIS  01/21/2010        Home Medications    Prior to Admission medications   Medication Sig Start Date End Date Taking? Authorizing Provider  CVS D3 2000 units CAPS Take 2,000 Units by mouth daily. 10/23/17   [provider]  diphenhydrAMINE (BENADRYL) 25 MG tablet Take 25 mg by mouth at bedtime.  [provider]  gabapentin (NEURONTIN) 100 MG capsule Take 200 mg by mouth 3 (three) times daily. 07/21/18   [provider]  haloperidol (HALDOL) 2 MG tablet Take 1 tablet (2 mg total) by mouth 2 (two) times daily. 01/11/19   Mikhail, Velta Addison, DO  lactulose (CHRONULAC) 10 GM/15ML solution Take 45 mLs (30 g total) by mouth 2 (two) times daily as needed (To have at least 2-3 loose bowel movements per day.). 01/11/19   Cristal Ford, DO  Oxycodone HCl 10 MG TABS Take 10 mg by mouth every 8 (eight) hours. 10/18/18   [provider]  pantoprazole (PROTONIX) 40 MG tablet Take 1 tablet (40 mg total) by mouth 2 (two) times daily before a meal.  01/11/19   Mikhail, Velta Addison, DO  predniSONE (DELTASONE) 50 MG tablet Take 1 tablet (50 mg total) by mouth daily. 11/26/18   Lawyer, Christopher, PA-C  SENSIPAR 30 MG tablet Take 30 mg by mouth daily. 07/25/17   [provider]  trihexyphenidyl (ARTANE) 2 MG tablet Take 1 tablet (2 mg total) by mouth 3 (three) times daily for 14 days. 01/16/19 01/30/19  Couture, Cortni S, PA-C  colchicine 0.6 MG tablet Take 1 tablet (0.6 mg total) by mouth daily. Patient not taking: Reported on 11/05/2018 08/26/18 12/31/18  Eustaquio Maize, PA-C    Family History Family History  Problem Relation Age of Onset  . Cancer Mother   . Heart attack Father     Social History Social History   Tobacco Use  . Smoking status: Current Every Day Smoker    Packs/day: 1.00    Years: 25.00    Pack years: 25.00    Types: Cigarettes  . Smokeless tobacco: Never Used  Substance Use Topics  . Alcohol use: No    Comment: Pt denies   . Drug use: No    Comment: Pt denies     Allergies   Patient has no known allergies.   Review of Systems Review of Systems  Constitutional: Negative for chills and fever.  HENT: Negative for facial swelling and sore throat.   Respiratory: Negative for shortness of breath.   Cardiovascular: Negative for chest pain.  Gastrointestinal: Negative for abdominal pain, nausea and vomiting.  Genitourinary: Negative for dysuria.  Musculoskeletal: Positive for arthralgias. Negative for back pain.  Skin: Negative for rash and wound.  Neurological: Negative for headaches.  Psychiatric/Behavioral: The patient is not nervous/anxious.      Physical Exam Updated Vital Signs BP 124/81 (BP Location: Right Arm)   Pulse 71   Temp 98.8 F (37.1 C) (Oral)   Resp 20   SpO2 100%   Physical Exam Vitals signs and nursing note reviewed.  Constitutional:      General: He is not in acute distress.    Appearance: He is well-developed. He is not diaphoretic.  HENT:     Head: Normocephalic  and atraumatic.     Mouth/Throat:     Pharynx: No oropharyngeal exudate.  Eyes:     General: No scleral icterus.       Right eye: No discharge.        Left eye: No discharge.     Conjunctiva/sclera: Conjunctivae normal.     Pupils: Pupils are equal, round, and reactive to light.  Neck:     Musculoskeletal: Normal range of motion and neck supple.     Thyroid: No thyromegaly.  Cardiovascular:     Rate and Rhythm: Normal rate and regular rhythm.  Heart sounds: Normal heart sounds. No murmur. No friction rub. No gallop.   Pulmonary:     Effort: Pulmonary effort is normal. No respiratory distress.     Breath sounds: Normal breath sounds. No stridor. No wheezing or rales.  Abdominal:     General: Bowel sounds are normal. There is no distension.     Palpations: Abdomen is soft.     Tenderness: There is no abdominal tenderness. There is no guarding or rebound.  Musculoskeletal:     Comments: Arthritic deformities to bilateral hands  Lymphadenopathy:     Cervical: No cervical adenopathy.  Skin:    General: Skin is warm and dry.     Coloration: Skin is not pale.     Findings: No rash.  Neurological:     Mental Status: He is alert.     Coordination: Coordination normal.      ED Treatments / Results  Labs (all labs ordered are listed, but only abnormal results are displayed) Labs Reviewed - No data to display  EKG None  Radiology No results found.  Procedures Procedures (including critical care time)  Medications Ordered in ED Medications  oxyCODONE (Oxy IR/ROXICODONE) immediate release tablet 10 mg (has no administration in time range)     Initial Impression / Assessment and Plan / ED Course  I have reviewed the triage vital signs and the nursing notes.  Pertinent labs & imaging results that were available during my care of the patient were reviewed by me and considered in my medical decision making (see chart for details).        Presenting for refill of  oxycodone for his chronic arthralgias.  Patient advised we could not do that from the emergency department, but given a single dose in the emergency department.  He is advised to follow-up with his doctor for further refills.  Patient understands and agrees with plan.  Patient vital stable throughout ED course and discharged in satisfactory condition.  Final Clinical Impressions(s) / ED Diagnoses   Final diagnoses:  Medication refill    ED Discharge Orders    None       Emi Holes, PA-C 02/05/19 1746    Gerhard Munch, MD 02/05/19 2156

## 2019-02-05 NOTE — Discharge Instructions (Signed)
Please see your doctor for further refills of your opioid medications.  Please return to the emergency department if you develop any new or worsening symptoms.

## 2019-02-06 ENCOUNTER — Emergency Department (HOSPITAL_COMMUNITY)
Admission: EM | Admit: 2019-02-06 | Discharge: 2019-02-06 | Discharge status: Against Medical Advice | Payer: Medicaid Other | Attending: Emergency Medicine | Admitting: Emergency Medicine

## 2019-02-06 DIAGNOSIS — M7918 Myalgia, other site: Secondary | ICD-10-CM | POA: Diagnosis present

## 2019-02-06 DIAGNOSIS — Z5321 Procedure and treatment not carried out due to patient leaving prior to being seen by health care provider: Secondary | ICD-10-CM | POA: Insufficient documentation

## 2019-02-06 NOTE — ED Notes (Signed)
Pt states he wants to leave. RN advised against it. Pt called cab and left

## 2019-02-06 NOTE — ED Triage Notes (Signed)
Pt back here today requesting pain meds for his chronic pain

## 2019-02-07 ENCOUNTER — Emergency Department (HOSPITAL_COMMUNITY)
Admission: EM | Admit: 2019-02-07 | Discharge: 2019-02-07 | Disposition: A | Discharge status: Home / Self Care | Payer: Medicaid Other | Source: Home / Self Care | Attending: Emergency Medicine | Admitting: Emergency Medicine

## 2019-02-07 ENCOUNTER — Other Ambulatory Visit: Payer: Self-pay

## 2019-02-07 DIAGNOSIS — E039 Hypothyroidism, unspecified: Secondary | ICD-10-CM | POA: Insufficient documentation

## 2019-02-07 DIAGNOSIS — Z79899 Other long term (current) drug therapy: Secondary | ICD-10-CM | POA: Insufficient documentation

## 2019-02-07 DIAGNOSIS — F259 Schizoaffective disorder, unspecified: Secondary | ICD-10-CM | POA: Insufficient documentation

## 2019-02-07 DIAGNOSIS — Z760 Encounter for issue of repeat prescription: Secondary | ICD-10-CM | POA: Insufficient documentation

## 2019-02-07 DIAGNOSIS — E1122 Type 2 diabetes mellitus with diabetic chronic kidney disease: Secondary | ICD-10-CM | POA: Insufficient documentation

## 2019-02-07 DIAGNOSIS — N183 Chronic kidney disease, stage 3 unspecified: Secondary | ICD-10-CM | POA: Insufficient documentation

## 2019-02-07 DIAGNOSIS — F4310 Post-traumatic stress disorder, unspecified: Secondary | ICD-10-CM | POA: Insufficient documentation

## 2019-02-07 DIAGNOSIS — G8929 Other chronic pain: Secondary | ICD-10-CM | POA: Insufficient documentation

## 2019-02-07 MED ORDER — OXYCODONE HCL 5 MG PO TABS
10.0000 mg | ORAL_TABLET | Freq: Once | ORAL | Status: AC
  Administered 2019-02-07: 10 mg via ORAL
  Filled 2019-02-07: qty 2

## 2019-02-07 NOTE — Discharge Instructions (Addendum)
I left a message with the clinic at [facility] on [address] (Dr. Melina Fiddler) to reach out to you about getting your medications refilled. Unfortunately we cannot prescribe oxycodone to you and we do not manage chronic pain. Please call or go to your clinic tomorrow to get this resolved as repeat visits to the ER will not result in a prescription for narcotics. Return to the ER if you have concern for an emergent problem.

## 2019-02-07 NOTE — ED Provider Notes (Signed)
[name] EMERGENCY DEPARTMENT Provider Note   CSN: [id number] Arrival date & time: 02/07/19  1453     History   Chief Complaint Chief Complaint  Patient presents with   Pain   Arthritis   Medication Refill    HPI [name] is a 58 y.o. male has had multiple recent visits to the emergency department for refill of his oxycodone he was last seen by provider while on 02/05/2019.  Patient has a history of PTSD, schizoaffective disorder and there is a potential language barrier along with memory issues which may be impeding his ability to understand the fact that we are unable to refill oxycodone medication here in the emergency department.  Patient states that he has tried to get in touch with his doctor who he is unable to tell me his name but states that it is a pain management clinic on [address].    HPI  Past Medical History:  Diagnosis Date   Anemia    Ascites    Chronic leg pain    Cirrhosis (HCC)    GERD (gastroesophageal reflux disease)    H/O diabetes insipidus    Hepatitis C    Completed therapy with Epclusa ~ 2018.  suspect therapy overseen by [facility] in [place], [place]   History of adrenal insufficiency    History of ARDS    History of blood transfusion    Hypothyroidism    Kidney disease    stage 3   Neuropathy    Overdose 12/2009   Peripheral neuropathy    Personality disorder (HCC)    PTSD (post-traumatic stress disorder)    SECONDARY TO WAR IN [place]   Schizoaffective disorder [facility])     Patient Active Problem List   Diagnosis Date Noted   Acute gastric ulcer    Duodenal ulcer    Acute esophagitis    Acute blood loss anemia    Hematemesis 01/08/2019   Prolonged QT interval 01/08/2019   Esophageal varices in cirrhosis (HCC) 01/08/2019   CKD (chronic kidney disease) stage 3, GFR 30-59 ml/min 01/08/2019   Suicidal ideation 01/08/2019   Closed displaced fracture of neck of left fifth  metacarpal bone 01/08/2019   UGIB (upper gastrointestinal bleed) 08/07/2018   Acute abdominal pain    Cirrhosis of liver with ascites (HCC)    Generalized abdominal pain    Hematemesis with nausea    Evaluation by psychiatric service required    Acute upper gastrointestinal bleeding 10/15/2017   PTSD (post-traumatic stress disorder)    Hypothyroidism    History of adrenal insufficiency    H/O diabetes insipidus    Schizoaffective disorder (HCC) 11/08/2011   Personality disorder (HCC)    Normocytic anemia    Ascites    Liver disease    Hepatitis C    Kidney disease    GERD (gastroesophageal reflux disease)     Past Surgical History:  Procedure Laterality Date   BIOPSY  01/10/2019   Procedure: BIOPSY;  Surgeon: Beverley Fiedler, MD;  Location: [facility] ENDOSCOPY;  Service: Endoscopy;;   CERVICAL SPINE SURGERY  unknown   ESOPHAGOGASTRODUODENOSCOPY (EGD) WITH PROPOFOL N/A 01/10/2019   Procedure: ESOPHAGOGASTRODUODENOSCOPY (EGD) WITH PROPOFOL;  Surgeon: Beverley Fiedler, MD;  Location: [facility] ENDOSCOPY;  Service: Endoscopy;  Laterality: N/A;   RIGHT HIP SURGERY  1996   US GUIDED LEFT THORACENTESIS  01/21/2010        Home Medications    Prior to Admission medications   Medication Sig  Start Date End Date Taking? Authorizing Provider  CVS D3 2000 units CAPS Take 2,000 Units by mouth daily. 10/23/17   [provider]  diphenhydrAMINE (BENADRYL) 25 MG tablet Take 25 mg by mouth at bedtime.     [provider]  gabapentin (NEURONTIN) 100 MG capsule Take 200 mg by mouth 3 (three) times daily. 07/21/18   [provider]  haloperidol (HALDOL) 2 MG tablet Take 1 tablet (2 mg total) by mouth 2 (two) times daily. 01/11/19   Mikhail, Velta Addison, DO  lactulose (CHRONULAC) 10 GM/15ML solution Take 45 mLs (30 g total) by mouth 2 (two) times daily as needed (To have at least 2-3 loose bowel movements per day.). 01/11/19   Cristal Ford, DO  Oxycodone HCl 10 MG  TABS Take 10 mg by mouth every 8 (eight) hours. 10/18/18   [provider]  pantoprazole (PROTONIX) 40 MG tablet Take 1 tablet (40 mg total) by mouth 2 (two) times daily before a meal. 01/11/19   Mikhail, Velta Addison, DO  predniSONE (DELTASONE) 50 MG tablet Take 1 tablet (50 mg total) by mouth daily. 11/26/18   Lawyer, Christopher, PA-C  SENSIPAR 30 MG tablet Take 30 mg by mouth daily. 07/25/17   [provider]  trihexyphenidyl (ARTANE) 2 MG tablet Take 1 tablet (2 mg total) by mouth 3 (three) times daily for 14 days. 01/16/19 01/30/19  Couture, Cortni S, PA-C  colchicine 0.6 MG tablet Take 1 tablet (0.6 mg total) by mouth daily. Patient not taking: Reported on 11/05/2018 08/26/18 12/31/18  Eustaquio Maize, PA-C    Family History Family History  Problem Relation Age of Onset   Cancer Mother    Heart attack Father     Social History Social History   Tobacco Use   Smoking status: Current Every Day Smoker    Packs/day: 1.00    Years: 25.00    Pack years: 25.00    Types: Cigarettes   Smokeless tobacco: Never Used  Substance Use Topics   Alcohol use: No    Comment: Pt denies    Drug use: No    Comment: Pt denies     Allergies   Patient has no known allergies.   Review of Systems Review of Systems  Ten systems reviewed and are negative for acute change, except as noted in the HPI.   Physical Exam Updated Vital Signs BP 135/65 (BP Location: Left Arm)    Pulse 74    Temp 98.2 F (36.8 C) (Oral)    Resp 18    SpO2 99%   Physical Exam Vitals signs and nursing note reviewed.  Constitutional:      General: He is not in acute distress.    Appearance: He is well-developed. He is not diaphoretic.  HENT:     Head: Normocephalic and atraumatic.  Eyes:     General: No scleral icterus.    Conjunctiva/sclera: Conjunctivae normal.  Neck:     Musculoskeletal: Normal range of motion and neck supple.  Cardiovascular:     Rate and Rhythm: Normal rate and regular  rhythm.     Heart sounds: Normal heart sounds.  Pulmonary:     Effort: Pulmonary effort is normal. No respiratory distress.     Breath sounds: Normal breath sounds.  Abdominal:     Palpations: Abdomen is soft.     Tenderness: There is no abdominal tenderness.  Skin:    General: Skin is warm and dry.  Neurological:     Mental Status: He is  alert.  Psychiatric:        Behavior: Behavior normal.      ED Treatments / Results  Labs (all labs ordered are listed, but only abnormal results are displayed) Labs Reviewed - No data to display  EKG None  Radiology No results found.  Procedures Procedures (including critical care time)  Medications Ordered in ED Medications - No data to display   Initial Impression / Assessment and Plan / ED Course  I have reviewed the triage vital signs and the nursing notes.  Pertinent labs & imaging results that were available during my care of the patient were reviewed by me and considered in my medical decision making (see chart for details).        Patient here for medication refill.  He was given a single dose of his normal medications.  I left a voice message with the patient's pain management clinic asking them to reach out to this patient to ensure that he gets his medication refill as it is after hours.  I have also asked him to facilitate this so he can stop utilizing EMS and emergency services for refill of his narcotic pain management for his chronic joint pain.  Try to make it very clear to the patient they were unable to fill his medications today and that he should call or go to the clinic first thing tomorrow morning.  Patient appears appropriate for discharge at this time. Final Clinical Impressions(s) / ED Diagnoses   Final diagnoses:  None    ED Discharge Orders    None       Arthor Captain, PA-C 02/07/19 2220    Arby Barrette, MD 02/09/19 1304

## 2019-02-07 NOTE — ED Notes (Signed)
Patient verbalizes understanding of discharge instructions. Opportunity for questioning and answers were provided. Armband removed by staff, pt discharged from ED via wheelchair. [employer] transportation arranged for patient.

## 2019-02-07 NOTE — ED Triage Notes (Signed)
Pt was here yesterday for same. Per ems, pt from home. Pt reports chronic pain all over for arthritis. Pt said he was prescribed oxycodone for pain but was unable to pick it up d/t needing doctor authorization. EMS VSS

## 2019-02-08 ENCOUNTER — Inpatient Hospital Stay (HOSPITAL_COMMUNITY)
Admission: EM | Admit: 2019-02-08 | Discharge: 2019-03-27 | DRG: 871 | Disposition: A | Discharge status: Hospice | Payer: Medicaid Other | Attending: Family Medicine | Admitting: Family Medicine

## 2019-02-08 ENCOUNTER — Other Ambulatory Visit: Payer: Self-pay

## 2019-02-08 ENCOUNTER — Emergency Department (HOSPITAL_COMMUNITY): Payer: Medicaid Other

## 2019-02-08 ENCOUNTER — Encounter (HOSPITAL_COMMUNITY): Payer: Self-pay | Admitting: Emergency Medicine

## 2019-02-08 DIAGNOSIS — G9341 Metabolic encephalopathy: Secondary | ICD-10-CM

## 2019-02-08 DIAGNOSIS — E861 Hypovolemia: Secondary | ICD-10-CM | POA: Diagnosis not present

## 2019-02-08 DIAGNOSIS — K921 Melena: Secondary | ICD-10-CM | POA: Diagnosis not present

## 2019-02-08 DIAGNOSIS — K7290 Hepatic failure, unspecified without coma: Secondary | ICD-10-CM | POA: Diagnosis not present

## 2019-02-08 DIAGNOSIS — I8510 Secondary esophageal varices without bleeding: Secondary | ICD-10-CM | POA: Diagnosis not present

## 2019-02-08 DIAGNOSIS — D62 Acute posthemorrhagic anemia: Secondary | ICD-10-CM | POA: Diagnosis present

## 2019-02-08 DIAGNOSIS — N179 Acute kidney failure, unspecified: Secondary | ICD-10-CM | POA: Diagnosis not present

## 2019-02-08 DIAGNOSIS — Z66 Do not resuscitate: Secondary | ICD-10-CM | POA: Diagnosis not present

## 2019-02-08 DIAGNOSIS — N183 Chronic kidney disease, stage 3 unspecified: Secondary | ICD-10-CM | POA: Diagnosis present

## 2019-02-08 DIAGNOSIS — D631 Anemia in chronic kidney disease: Secondary | ICD-10-CM | POA: Diagnosis present

## 2019-02-08 DIAGNOSIS — R188 Other ascites: Secondary | ICD-10-CM | POA: Diagnosis present

## 2019-02-08 DIAGNOSIS — R6521 Severe sepsis with septic shock: Secondary | ICD-10-CM | POA: Diagnosis present

## 2019-02-08 DIAGNOSIS — K668 Other specified disorders of peritoneum: Secondary | ICD-10-CM | POA: Diagnosis not present

## 2019-02-08 DIAGNOSIS — G629 Polyneuropathy, unspecified: Secondary | ICD-10-CM | POA: Diagnosis present

## 2019-02-08 DIAGNOSIS — Z789 Other specified health status: Secondary | ICD-10-CM

## 2019-02-08 DIAGNOSIS — K766 Portal hypertension: Secondary | ICD-10-CM | POA: Diagnosis present

## 2019-02-08 DIAGNOSIS — E162 Hypoglycemia, unspecified: Secondary | ICD-10-CM | POA: Diagnosis present

## 2019-02-08 DIAGNOSIS — K659 Peritonitis, unspecified: Secondary | ICD-10-CM | POA: Diagnosis present

## 2019-02-08 DIAGNOSIS — K7460 Unspecified cirrhosis of liver: Secondary | ICD-10-CM | POA: Diagnosis not present

## 2019-02-08 DIAGNOSIS — K745 Biliary cirrhosis, unspecified: Secondary | ICD-10-CM | POA: Diagnosis not present

## 2019-02-08 DIAGNOSIS — K449 Diaphragmatic hernia without obstruction or gangrene: Secondary | ICD-10-CM | POA: Diagnosis present

## 2019-02-08 DIAGNOSIS — Z8639 Personal history of other endocrine, nutritional and metabolic disease: Secondary | ICD-10-CM | POA: Diagnosis not present

## 2019-02-08 DIAGNOSIS — K2210 Ulcer of esophagus without bleeding: Secondary | ICD-10-CM | POA: Diagnosis present

## 2019-02-08 DIAGNOSIS — F259 Schizoaffective disorder, unspecified: Secondary | ICD-10-CM | POA: Diagnosis present

## 2019-02-08 DIAGNOSIS — K7200 Acute and subacute hepatic failure without coma: Secondary | ICD-10-CM | POA: Diagnosis not present

## 2019-02-08 DIAGNOSIS — Z20828 Contact with and (suspected) exposure to other viral communicable diseases: Secondary | ICD-10-CM | POA: Diagnosis not present

## 2019-02-08 DIAGNOSIS — D696 Thrombocytopenia, unspecified: Secondary | ICD-10-CM | POA: Diagnosis not present

## 2019-02-08 DIAGNOSIS — R45851 Suicidal ideations: Secondary | ICD-10-CM | POA: Diagnosis not present

## 2019-02-08 DIAGNOSIS — E43 Unspecified severe protein-calorie malnutrition: Secondary | ICD-10-CM | POA: Diagnosis present

## 2019-02-08 DIAGNOSIS — A419 Sepsis, unspecified organism: Secondary | ICD-10-CM | POA: Diagnosis not present

## 2019-02-08 DIAGNOSIS — E2740 Unspecified adrenocortical insufficiency: Secondary | ICD-10-CM | POA: Diagnosis present

## 2019-02-08 DIAGNOSIS — R197 Diarrhea, unspecified: Secondary | ICD-10-CM | POA: Diagnosis present

## 2019-02-08 DIAGNOSIS — J90 Pleural effusion, not elsewhere classified: Secondary | ICD-10-CM | POA: Diagnosis not present

## 2019-02-08 DIAGNOSIS — R1084 Generalized abdominal pain: Secondary | ICD-10-CM | POA: Diagnosis present

## 2019-02-08 DIAGNOSIS — M069 Rheumatoid arthritis, unspecified: Secondary | ICD-10-CM | POA: Diagnosis not present

## 2019-02-08 DIAGNOSIS — K7031 Alcoholic cirrhosis of liver with ascites: Secondary | ICD-10-CM | POA: Diagnosis not present

## 2019-02-08 DIAGNOSIS — F17210 Nicotine dependence, cigarettes, uncomplicated: Secondary | ICD-10-CM | POA: Diagnosis present

## 2019-02-08 DIAGNOSIS — E872 Acidosis: Secondary | ICD-10-CM | POA: Diagnosis present

## 2019-02-08 DIAGNOSIS — Z7189 Other specified counseling: Secondary | ICD-10-CM | POA: Diagnosis not present

## 2019-02-08 DIAGNOSIS — Z7952 Long term (current) use of systemic steroids: Secondary | ICD-10-CM

## 2019-02-08 DIAGNOSIS — Z79891 Long term (current) use of opiate analgesic: Secondary | ICD-10-CM

## 2019-02-08 DIAGNOSIS — K631 Perforation of intestine (nontraumatic): Secondary | ICD-10-CM | POA: Diagnosis present

## 2019-02-08 DIAGNOSIS — F251 Schizoaffective disorder, depressive type: Secondary | ICD-10-CM | POA: Diagnosis not present

## 2019-02-08 DIAGNOSIS — R627 Adult failure to thrive: Secondary | ICD-10-CM | POA: Diagnosis present

## 2019-02-08 DIAGNOSIS — K219 Gastro-esophageal reflux disease without esophagitis: Secondary | ICD-10-CM | POA: Diagnosis present

## 2019-02-08 DIAGNOSIS — K7030 Alcoholic cirrhosis of liver without ascites: Secondary | ICD-10-CM | POA: Diagnosis not present

## 2019-02-08 DIAGNOSIS — R64 Cachexia: Secondary | ICD-10-CM | POA: Diagnosis present

## 2019-02-08 DIAGNOSIS — L89312 Pressure ulcer of right buttock, stage 2: Secondary | ICD-10-CM | POA: Diagnosis not present

## 2019-02-08 DIAGNOSIS — R161 Splenomegaly, not elsewhere classified: Secondary | ICD-10-CM | POA: Diagnosis present

## 2019-02-08 DIAGNOSIS — B1920 Unspecified viral hepatitis C without hepatic coma: Secondary | ICD-10-CM | POA: Diagnosis present

## 2019-02-08 DIAGNOSIS — R140 Abdominal distension (gaseous): Secondary | ICD-10-CM

## 2019-02-08 DIAGNOSIS — K7210 Chronic hepatic failure without coma: Secondary | ICD-10-CM

## 2019-02-08 DIAGNOSIS — N170 Acute kidney failure with tubular necrosis: Secondary | ICD-10-CM | POA: Diagnosis present

## 2019-02-08 DIAGNOSIS — R739 Hyperglycemia, unspecified: Secondary | ICD-10-CM | POA: Diagnosis not present

## 2019-02-08 DIAGNOSIS — Z79899 Other long term (current) drug therapy: Secondary | ICD-10-CM

## 2019-02-08 DIAGNOSIS — Z452 Encounter for adjustment and management of vascular access device: Secondary | ICD-10-CM

## 2019-02-08 DIAGNOSIS — R011 Cardiac murmur, unspecified: Secondary | ICD-10-CM | POA: Diagnosis not present

## 2019-02-08 DIAGNOSIS — Z6841 Body Mass Index (BMI) 40.0 and over, adult: Secondary | ICD-10-CM

## 2019-02-08 DIAGNOSIS — Z8249 Family history of ischemic heart disease and other diseases of the circulatory system: Secondary | ICD-10-CM

## 2019-02-08 DIAGNOSIS — K429 Umbilical hernia without obstruction or gangrene: Secondary | ICD-10-CM | POA: Diagnosis present

## 2019-02-08 DIAGNOSIS — K256 Chronic or unspecified gastric ulcer with both hemorrhage and perforation: Secondary | ICD-10-CM | POA: Diagnosis present

## 2019-02-08 DIAGNOSIS — N1832 Chronic kidney disease, stage 3b: Secondary | ICD-10-CM

## 2019-02-08 DIAGNOSIS — F203 Undifferentiated schizophrenia: Secondary | ICD-10-CM | POA: Diagnosis not present

## 2019-02-08 DIAGNOSIS — F250 Schizoaffective disorder, bipolar type: Secondary | ICD-10-CM | POA: Diagnosis not present

## 2019-02-08 DIAGNOSIS — I9589 Other hypotension: Secondary | ICD-10-CM | POA: Diagnosis present

## 2019-02-08 DIAGNOSIS — T82898A Other specified complication of vascular prosthetic devices, implants and grafts, initial encounter: Secondary | ICD-10-CM

## 2019-02-08 DIAGNOSIS — I700 Atherosclerosis of aorta: Secondary | ICD-10-CM | POA: Diagnosis present

## 2019-02-08 DIAGNOSIS — Z931 Gastrostomy status: Secondary | ICD-10-CM

## 2019-02-08 DIAGNOSIS — L8990 Pressure ulcer of unspecified site, unspecified stage: Secondary | ICD-10-CM | POA: Insufficient documentation

## 2019-02-08 DIAGNOSIS — Z95828 Presence of other vascular implants and grafts: Secondary | ICD-10-CM

## 2019-02-08 DIAGNOSIS — Z515 Encounter for palliative care: Secondary | ICD-10-CM

## 2019-02-08 DIAGNOSIS — R198 Other specified symptoms and signs involving the digestive system and abdomen: Secondary | ICD-10-CM | POA: Diagnosis not present

## 2019-02-08 DIAGNOSIS — K922 Gastrointestinal hemorrhage, unspecified: Secondary | ICD-10-CM

## 2019-02-08 DIAGNOSIS — G9349 Other encephalopathy: Secondary | ICD-10-CM | POA: Diagnosis present

## 2019-02-08 DIAGNOSIS — D689 Coagulation defect, unspecified: Secondary | ICD-10-CM | POA: Diagnosis present

## 2019-02-08 DIAGNOSIS — L89152 Pressure ulcer of sacral region, stage 2: Secondary | ICD-10-CM | POA: Diagnosis not present

## 2019-02-08 DIAGNOSIS — F329 Major depressive disorder, single episode, unspecified: Secondary | ICD-10-CM | POA: Diagnosis present

## 2019-02-08 DIAGNOSIS — E039 Hypothyroidism, unspecified: Secondary | ICD-10-CM | POA: Diagnosis present

## 2019-02-08 DIAGNOSIS — K625 Hemorrhage of anus and rectum: Secondary | ICD-10-CM | POA: Diagnosis present

## 2019-02-08 DIAGNOSIS — R1010 Upper abdominal pain, unspecified: Secondary | ICD-10-CM | POA: Diagnosis not present

## 2019-02-08 DIAGNOSIS — K767 Hepatorenal syndrome: Secondary | ICD-10-CM | POA: Diagnosis present

## 2019-02-08 DIAGNOSIS — F4312 Post-traumatic stress disorder, chronic: Secondary | ICD-10-CM | POA: Diagnosis present

## 2019-02-08 DIAGNOSIS — E876 Hypokalemia: Secondary | ICD-10-CM | POA: Diagnosis not present

## 2019-02-08 DIAGNOSIS — E871 Hypo-osmolality and hyponatremia: Secondary | ICD-10-CM

## 2019-02-08 DIAGNOSIS — F4310 Post-traumatic stress disorder, unspecified: Secondary | ICD-10-CM | POA: Diagnosis present

## 2019-02-08 DIAGNOSIS — R109 Unspecified abdominal pain: Secondary | ICD-10-CM | POA: Diagnosis present

## 2019-02-08 DIAGNOSIS — K8020 Calculus of gallbladder without cholecystitis without obstruction: Secondary | ICD-10-CM | POA: Diagnosis present

## 2019-02-08 DIAGNOSIS — L89322 Pressure ulcer of left buttock, stage 2: Secondary | ICD-10-CM | POA: Diagnosis not present

## 2019-02-08 DIAGNOSIS — G894 Chronic pain syndrome: Secondary | ICD-10-CM | POA: Diagnosis present

## 2019-02-08 DIAGNOSIS — K252 Acute gastric ulcer with both hemorrhage and perforation: Secondary | ICD-10-CM | POA: Diagnosis not present

## 2019-02-08 DIAGNOSIS — L89626 Pressure-induced deep tissue damage of left heel: Secondary | ICD-10-CM | POA: Diagnosis not present

## 2019-02-08 DIAGNOSIS — K255 Chronic or unspecified gastric ulcer with perforation: Secondary | ICD-10-CM | POA: Diagnosis not present

## 2019-02-08 DIAGNOSIS — I959 Hypotension, unspecified: Secondary | ICD-10-CM

## 2019-02-08 DIAGNOSIS — R935 Abnormal findings on diagnostic imaging of other abdominal regions, including retroperitoneum: Secondary | ICD-10-CM | POA: Diagnosis not present

## 2019-02-08 DIAGNOSIS — K7469 Other cirrhosis of liver: Secondary | ICD-10-CM | POA: Diagnosis not present

## 2019-02-08 DIAGNOSIS — D6959 Other secondary thrombocytopenia: Secondary | ICD-10-CM | POA: Diagnosis present

## 2019-02-08 LAB — COMPREHENSIVE METABOLIC PANEL
ALT: 8 U/L (ref 0–44)
AST: 15 U/L (ref 15–41)
Albumin: 2.2 g/dL — ABNORMAL LOW (ref 3.5–5.0)
Alkaline Phosphatase: 59 U/L (ref 38–126)
Anion gap: 9 (ref 5–15)
BUN: 26 mg/dL — ABNORMAL HIGH (ref 6–20)
CO2: 19 mmol/L — ABNORMAL LOW (ref 22–32)
Calcium: 7.9 mg/dL — ABNORMAL LOW (ref 8.9–10.3)
Chloride: 112 mmol/L — ABNORMAL HIGH (ref 98–111)
Creatinine, Ser: 1.98 mg/dL — ABNORMAL HIGH (ref 0.61–1.24)
GFR calc Af Amer: 42 mL/min — ABNORMAL LOW (ref >60)
GFR calc non Af Amer: 36 mL/min — ABNORMAL LOW (ref >60)
Glucose, Bld: 91 mg/dL (ref 70–99)
Potassium: 4 mmol/L (ref 3.5–5.1)
Sodium: 140 mmol/L (ref 135–145)
Total Bilirubin: 0.9 mg/dL (ref 0.3–1.2)
Total Protein: 4.7 g/dL — ABNORMAL LOW (ref 6.5–8.1)

## 2019-02-08 LAB — ETHANOL: Alcohol, Ethyl (B): <10 mg/dL (ref <10)

## 2019-02-08 LAB — CBC WITH DIFFERENTIAL/PLATELET
Abs Immature Granulocytes: 0.01 10*3/uL (ref 0.00–0.07)
Basophils Absolute: 0.1 10*3/uL (ref 0.0–0.1)
Basophils Relative: 1 %
Eosinophils Absolute: 0.4 10*3/uL (ref 0.0–0.5)
Eosinophils Relative: 6 %
HCT: 28.4 % — ABNORMAL LOW (ref 39.0–52.0)
Hemoglobin: 8.5 g/dL — ABNORMAL LOW (ref 13.0–17.0)
Immature Granulocytes: 0 %
Lymphocytes Relative: 20 %
Lymphs Abs: 1.2 10*3/uL (ref 0.7–4.0)
MCH: 29.8 pg (ref 26.0–34.0)
MCHC: 29.9 g/dL — ABNORMAL LOW (ref 30.0–36.0)
MCV: 99.6 fL (ref 80.0–100.0)
Monocytes Absolute: 0.3 10*3/uL (ref 0.1–1.0)
Monocytes Relative: 4 %
Neutro Abs: 4.1 10*3/uL (ref 1.7–7.7)
Neutrophils Relative %: 69 %
Platelets: 159 10*3/uL (ref 150–400)
RBC: 2.85 MIL/uL — ABNORMAL LOW (ref 4.22–5.81)
RDW: 18 % — ABNORMAL HIGH (ref 11.5–15.5)
WBC: 6.1 10*3/uL (ref 4.0–10.5)
nRBC: 0 % (ref 0.0–0.2)

## 2019-02-08 LAB — MAGNESIUM: Magnesium: 1.8 mg/dL (ref 1.7–2.4)

## 2019-02-08 LAB — AMMONIA: Ammonia: 72 umol/L — ABNORMAL HIGH (ref 9–35)

## 2019-02-08 LAB — PROTIME-INR
INR: 1.6 — ABNORMAL HIGH (ref 0.8–1.2)
Prothrombin Time: 19.1 seconds — ABNORMAL HIGH (ref 11.4–15.2)

## 2019-02-08 LAB — SARS CORONAVIRUS 2 (TAT 6-24 HRS): SARS Coronavirus 2: NEGATIVE

## 2019-02-08 LAB — PHOSPHORUS: Phosphorus: 3.8 mg/dL (ref 2.5–4.6)

## 2019-02-08 MED ORDER — PIPERACILLIN-TAZOBACTAM 3.375 G IVPB
3.3750 g | Freq: Three times a day (TID) | INTRAVENOUS | Status: DC
  Administered 2019-02-09 – 2019-03-06 (×77): 3.375 g via INTRAVENOUS
  Filled 2019-02-08 (×78): qty 50

## 2019-02-08 MED ORDER — PANTOPRAZOLE SODIUM 40 MG IV SOLR
40.0000 mg | INTRAVENOUS | Status: DC
  Administered 2019-02-08: 18:00:00 40 mg via INTRAVENOUS
  Filled 2019-02-08: qty 40

## 2019-02-08 MED ORDER — PIPERACILLIN-TAZOBACTAM 3.375 G IVPB 30 MIN
3.3750 g | Freq: Once | INTRAVENOUS | Status: AC
  Administered 2019-02-08: 3.375 g via INTRAVENOUS
  Filled 2019-02-08: qty 50

## 2019-02-08 MED ORDER — LORAZEPAM 2 MG/ML IJ SOLN
1.0000 mg | Freq: Once | INTRAMUSCULAR | Status: AC
  Administered 2019-02-08: 1 mg via INTRAVENOUS
  Filled 2019-02-08: qty 1

## 2019-02-08 MED ORDER — ONDANSETRON HCL 4 MG/2ML IJ SOLN
4.0000 mg | Freq: Four times a day (QID) | INTRAMUSCULAR | Status: DC | PRN
  Administered 2019-03-11 – 2019-03-13 (×2): 4 mg via INTRAVENOUS
  Filled 2019-02-08 (×2): qty 2

## 2019-02-08 MED ORDER — LACTATED RINGERS IV SOLN
INTRAVENOUS | Status: DC
  Administered 2019-02-08: 19:00:00 via INTRAVENOUS

## 2019-02-08 MED ORDER — IOHEXOL 300 MG/ML  SOLN
100.0000 mL | Freq: Once | INTRAMUSCULAR | Status: AC | PRN
  Administered 2019-02-08: 100 mL via INTRAVENOUS

## 2019-02-08 MED ORDER — MORPHINE SULFATE (PF) 4 MG/ML IV SOLN
4.0000 mg | Freq: Once | INTRAVENOUS | Status: AC
  Administered 2019-02-08: 4 mg via INTRAVENOUS
  Filled 2019-02-08: qty 1

## 2019-02-08 MED ORDER — HYDROMORPHONE HCL 1 MG/ML IJ SOLN
1.0000 mg | INTRAMUSCULAR | Status: DC | PRN
  Administered 2019-02-09: 01:00:00 1 mg via INTRAVENOUS
  Filled 2019-02-08: qty 1

## 2019-02-08 MED ORDER — LACTATED RINGERS IV BOLUS
500.0000 mL | Freq: Once | INTRAVENOUS | Status: AC
  Administered 2019-02-08: 500 mL via INTRAVENOUS

## 2019-02-08 NOTE — ED Triage Notes (Signed)
Pt BIB [facility] from home. EMS called out for abdominal pain. Pt complaining of generalized abdominal pain and possible blood in his stool. Pt abdomen distended upon palpation. VSS. NAD.

## 2019-02-08 NOTE — ED Provider Notes (Signed)
[facility] Provider Note   CSN: [id number] Arrival date & time: 02/08/19  1107     History   Chief Complaint Chief Complaint  Patient presents with   Abdominal Pain    HPI Benjamin Pulsednan Hull is a 58 y.o. male.      Abdominal Pain Pain location:  Generalized Pain quality: cramping and stabbing   Pain radiates to:  Does not radiate Pain severity:  Severe Onset quality:  Gradual Duration: Chronic for approximately a year and a half however it is worse today. Timing:  Constant Progression:  Worsening Chronicity:  Chronic Context: previous surgery   Context: not recent illness and not trauma   Relieved by:  Nothing Worsened by:  Nothing Ineffective treatments:  None tried Associated symptoms: anorexia and melena   Associated symptoms: no chest pain, no chills, no cough, no dysuria, no fever, no hematemesis, no hematochezia, no hematuria, no shortness of breath, no sore throat and no vomiting   Risk factors: obesity     Past Medical History:  Diagnosis Date   Anemia    Ascites    Chronic leg pain    Cirrhosis (HCC)    GERD (gastroesophageal reflux disease)    H/O diabetes insipidus    Hepatitis C    Completed therapy with Epclusa ~ 2018.  suspect therapy [place] [facility] [place]   History of adrenal insufficiency    History of ARDS    History of blood transfusion    Hypothyroidism    Kidney disease    stage 3   Neuropathy    Overdose 12/2009   Peripheral neuropathy    Personality disorder (HCC)    PTSD (post-traumatic stress disorder)    [place]   Schizoaffective disorder [facility](HCC)     Patient Active Problem List   Diagnosis Date Noted   Abdominal pain 02/08/2019   Bowel perforation (HCC) 02/08/2019   Bleeding per rectum    Acute gastric ulcer    Duodenal ulcer    Acute esophagitis    Acute blood loss anemia    Hematemesis 01/08/2019    Prolonged QT interval 01/08/2019   Esophageal varices in cirrhosis (HCC) 01/08/2019   CKD (chronic kidney disease) stage 3, GFR 30-59 ml/min 01/08/2019   Suicidal ideation 01/08/2019   Closed displaced fracture of neck of left fifth metacarpal bone 01/08/2019   UGIB (upper gastrointestinal bleed) 08/07/2018   Acute abdominal pain    Cirrhosis (HCC)    Generalized abdominal pain    Hematemesis with nausea    Evaluation by psychiatric service required    Acute upper gastrointestinal bleeding 10/15/2017   PTSD (post-traumatic stress disorder)    Hypothyroidism    History of adrenal insufficiency    H/O diabetes insipidus    Schizoaffective disorder (HCC) 11/08/2011   Personality disorder (HCC)    Normocytic anemia    Ascites    Liver disease    Hepatitis C    Kidney disease    GERD (gastroesophageal reflux disease)     Past Surgical History:  Procedure Laterality Date   BIOPSY  01/10/2019   Procedure: BIOPSY;  Surgeon: Beverley FiedlerPyrtle, Jay M, MD;  Location: [facility];  Service: Endoscopy;;   CERVICAL SPINE SURGERY  unknown   ESOPHAGOGASTRODUODENOSCOPY (EGD) WITH PROPOFOL N/A 01/10/2019   Procedure: ESOPHAGOGASTRODUODENOSCOPY (EGD) WITH PROPOFOL;  Surgeon: Beverley FiedlerPyrtle, Jay M, MD;  Location: [facility] END[facility];  Service: Endoscopy;  Laterality: N/A;   RIGHT HIP SURGERY  1996   US GUIDED LEFT THORACENTESIS  01/21/2010        Home Medications    Prior to Admission medications   Medication Sig Start Date End Date Taking? Authorizing Provider  [employer] D3 2000 units CAPS Take 2,000 Units by mouth daily. 10/23/17  Yes [provider]  diphenhydrAMINE (BENADRYL) 25 MG tablet Take 25 mg by mouth at bedtime.    Yes [provider]  gabapentin (NEURONTIN) 100 MG capsule Take 200 mg by mouth 3 (three) times daily. 07/21/18  Yes [provider]  haloperidol (HALDOL) 2 MG tablet Take 1 tablet (2 mg total) by mouth 2 (two) times daily. 01/11/19  Yes  Mikhail, Maryann, DO  lactulose (CHRONULAC) 10 GM/15ML solution Take 45 mLs (30 g total) by mouth 2 (two) times daily as needed (To have at least 2-3 loose bowel movements per day.). 01/11/19  Yes Mikhail, Velta Addison, DO  Oxycodone HCl 10 MG TABS Take 10 mg by mouth every 8 (eight) hours. 10/18/18  Yes [provider]  pantoprazole (PROTONIX) 40 MG tablet Take 1 tablet (40 mg total) by mouth 2 (two) times daily before a meal. 01/11/19  Yes Mikhail, Velta Addison, DO  predniSONE (DELTASONE) 50 MG tablet Take 1 tablet (50 mg total) by mouth daily. 11/26/18  Yes Lawyer, Addison Whidbee, PA-C  SENSIPAR 30 MG tablet Take 30 mg by mouth daily. 07/25/17  Yes [provider]  colchicine 0.6 MG tablet Take 1 tablet (0.6 mg total) by mouth daily. Patient not taking: Reported on 11/05/2018 08/26/18 12/31/18  Eustaquio Maize, PA-C    Family History Family History  Problem Relation Age of Onset   Cancer Mother    Heart attack Father     Social History Social History   Tobacco Use   Smoking status: Current Every Day Smoker    Packs/day: 1.00    Years: 25.00    Pack years: 25.00    Types: Cigarettes   Smokeless tobacco: Never Used  Substance Use Topics   Alcohol use: No    Comment: Pt denies    Drug use: No    Comment: Pt denies     Allergies   Patient has no known allergies.   Review of Systems Review of Systems  Constitutional: Negative for chills and fever.  HENT: Negative for ear pain and sore throat.   Eyes: Negative for pain and visual disturbance.  Respiratory: Negative for cough and shortness of breath.   Cardiovascular: Negative for chest pain and palpitations.  Gastrointestinal: Positive for abdominal distention, abdominal pain, anal bleeding, anorexia, blood in stool and melena. Negative for hematemesis, hematochezia and vomiting.  Genitourinary: Negative for dysuria and hematuria.  Musculoskeletal: Negative for arthralgias and back pain.  Skin: Negative for color  change and rash.  Neurological: Negative for seizures and syncope.  All other systems reviewed and are negative.    Physical Exam Updated Vital Signs BP 97/83    Pulse 75    Temp 98.3 F (36.8 C) (Oral)    Resp 17    Ht 6\' 6"  (1.981 m)    Wt 99.8 kg    SpO2 98%    BMI 25.43 kg/m   Physical Exam Vitals signs and nursing note reviewed.  Constitutional:      Appearance: He is well-developed.  HENT:     Head: Normocephalic and atraumatic.  Eyes:     Conjunctiva/sclera: Conjunctivae normal.  Neck:     Musculoskeletal: Neck supple.  Cardiovascular:     Rate and  Rhythm: Normal rate and regular rhythm.     Heart sounds: No murmur.  Pulmonary:     Effort: Pulmonary effort is normal. No respiratory distress.     Breath sounds: Normal breath sounds.  Abdominal:     Palpations: Abdomen is soft.     Tenderness: There is generalized abdominal tenderness.     Hernia: A hernia is present. Hernia is present in the umbilical area.  Genitourinary:    Penis: Normal.      Scrotum/Testes: Normal.        Right: Tenderness not present.     Comments: Homero Fellers positive blood on the glove after digital rectal exam Skin:    General: Skin is warm and dry.  Neurological:     Mental Status: He is alert.      ED Treatments / Results  Labs (all labs ordered are listed, but only abnormal results are displayed) Labs Reviewed  CBC WITH DIFFERENTIAL/PLATELET - Abnormal; Notable for the following components:      Result Value   RBC 2.85 (*)    Hemoglobin 8.5 (*)    HCT 28.4 (*)    MCHC 29.9 (*)    RDW 18.0 (*)    All other components within normal limits  COMPREHENSIVE METABOLIC PANEL - Abnormal; Notable for the following components:   Chloride 112 (*)    CO2 19 (*)    BUN 26 (*)    Creatinine, Ser 1.98 (*)    Calcium 7.9 (*)    Total Protein 4.7 (*)    Albumin 2.2 (*)    GFR calc non Af Amer 36 (*)    GFR calc Af Amer 42 (*)    All other components within normal limits  PROTIME-INR -  Abnormal; Notable for the following components:   Prothrombin Time 19.1 (*)    INR 1.6 (*)    All other components within normal limits  SARS CORONAVIRUS 2 (TAT 6-24 HRS)  CULTURE, BLOOD (ROUTINE X 2)  CULTURE, BLOOD (ROUTINE X 2)  MAGNESIUM  PHOSPHORUS  URINALYSIS, ROUTINE W REFLEX MICROSCOPIC  AMMONIA  ETHANOL  TYPE AND SCREEN    EKG None  Radiology Ct Abdomen Pelvis W Contrast  Result Date: 02/08/2019 CLINICAL DATA:  Abdominal pain and possible rectal bleeding. EXAM: CT ABDOMEN AND PELVIS WITH CONTRAST TECHNIQUE: Multidetector CT imaging of the abdomen and pelvis was performed using the standard protocol following bolus administration of intravenous contrast. 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 Mild scattered subsegmental atelectasis. No other acute abnormalities in the lower chest. Hepatobiliary: The shrunken nodular liver is consistent with cirrhosis, unchanged. No focal mass. The portal vein remains patent. Cholelithiasis is identified. Pancreas: Pancreas is normal. Spleen: Splenomegaly is again identified. Adrenals/Urinary Tract: Adrenal glands are normal. Renal cysts are again identified. No hydronephrosis or perinephric stranding. No ureterectasis or ureteral stones. Bladder is unremarkable. Stomach/Bowel: The stomach and small bowel are normal. Small bowel loops herniated into a periumbilical hernia with no evidence of obstruction. Wall thickening associated with the cecum and proximal ascending colon is not excluded. The appearance could be due to surrounding ascites and lack of distention. The remainder of the colon is normal. The appendix is not seen but there is no secondary evidence of appendicitis. Vascular/Lymphatic: Atherosclerotic changes are seen in the nonaneurysmal aorta. Varicose sees are again seen throughout the upper abdomen consistent with known cirrhosis. No  retroperitoneal adenopathy. Prominent nodes in  the mesentery are likely due to cirrhosis and not significantly changed. No other evidence of adenopathy. Reproductive: Prostate is unremarkable. Other: Severe ascites throughout the abdomen is severely worsened in the interval. Free air is identified. No source is noted. Musculoskeletal: Chronic changes to the right hip are identified consistent with previous trauma. A compression fracture of L1 is stable and nonacute. No other acute bony abnormalities. IMPRESSION: 1. There is free air in the abdomen. A definitive source is not identified. 2. Cirrhosis with numerous varices, severe ascites, and splenomegaly. 3. Cholelithiasis. 4. Small bowel loops are herniated into a periumbilical hernia with no evidence of obstruction or wall thickening. 5. The wall of the cecum and ascending colon appears somewhat prominent. The finding could be due to surrounding ascites and lack of distension. The finding could also be secondary to the patient's known cirrhosis and portal venous hypertension. 6. Atherosclerosis in the aorta. Prominent mesenteric nodes, likely secondary to cirrhosis. The findings were called to Dr. Rubin Payor. Electronically Signed   By: Gerome Sam III M.D   On: 02/08/2019 15:09    Procedures Procedures (including critical care time)  Medications Ordered in ED Medications  piperacillin-tazobactam (ZOSYN) IVPB 3.375 g (has no administration in time range)  piperacillin-tazobactam (ZOSYN) IVPB 3.375 g (has no administration in time range)  morphine 4 MG/ML injection 4 mg (4 mg Intravenous Given 02/08/19 1154)  LORazepam (ATIVAN) injection 1 mg (1 mg Intravenous Given 02/08/19 1233)  iohexol (OMNIPAQUE) 300 MG/ML solution 100 mL (100 mLs Intravenous Contrast Given 02/08/19 1330)  lactated ringers bolus 500 mL (500 mLs Intravenous New Bag/Given 02/08/19 1532)     Initial Impression / Assessment and Plan / ED Course  I have reviewed the triage  vital signs and the nursing notes.  Pertinent labs & imaging results that were available during my care of the patient were reviewed by me and considered in my medical decision making (see chart for details).        Patient is a 58 year old male with history and physical exam as above who presents to the emergency department for evaluation of chronic abdominal pain and abdominal distention but with new onset dark red rectal bleeding.  States he has had stomach ulcers in the past and has had similar rectal bleeding with those.  Due to his abdominal pain, abdominal distention, and rectal bleeding CT scan of the abdomen pelvis was obtained which demonstrated free air of unclear etiology as well as abdominal pelvic ascites.  Labs were also obtained.  Hemoglobin 8.5 which is similar to previous however there is active bleeding from the patient's rectum therefore type and screen was obtained.  Remainder of labs demonstrated no leukocytosis, elevated but similar to previous.  For the free air in the patient's abdomen patient was started on antibiotics with Zosyn given in the emergency department.  Blood cultures ordered.  Surgery was consulted.  Please see their note for further details of their assessment and plan.  Patient to be admitted to hospitalist service for further work-up and management.  Final Clinical Impressions(s) / ED Diagnoses   Final diagnoses:  Generalized abdominal pain  Intra-abdominal free air of unknown etiology    ED Discharge Orders    None       Jonna Clark, MD 02/08/19 [id number]    Benjiman Core, MD 02/09/19 [phone]

## 2019-02-08 NOTE — Consult Note (Signed)
[facility] Surgery Consult Note  [name] 26-Jun-1960  [id number].    Requesting MD: Dr. Romona Curls Chief Complaint/Reason for Consult: Abdominal pain, hematochezia   HPI: [name] is a 58 y.o. 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  Patient had just received pain medication before I was able to evaluate him, therefore history primarily taken from chart review.   Patient apparently had onset of acute on chronic abdominal pain this morning that was generalized along with abdominal distention and new dark red blood per the rectum. Unsure of the number of episodes. He has had similar episodes previously that were related to bleeding stomach ulcers.  In the ED he has been afebrile.  Heart rate 75-78.  BP 94/53-119/68.  Currently 97/83.  Hemoglobin 8.5, Hct 28.4.  Appears patient's baseline hgb ranges from is 7.4 - 9.7. CO2 19, Cr 1.98, Albumin 2.2, normal LFT's. INR 1.6. CT A/P with free air in the abdomen, without definitive source identified.  There is cirrhosis with numerous varices, severe ascites and splenomegaly.  There is noted also to be small loops of bowel within a periumbilical hernia without evidence of obstruction.  General surgery was asked to see.  Medicine to admit. Gastroenterology also to consult.  Patient did have a upper endoscopy on 01/10/19 that showed Grade D acute esophagitis and a very large, non-bleeding gastric ulcer in antrum with no stigmata of bleeding, as well as non-bleeding duodenal ulcers.   ROS: Review of Systems  Unable to perform ROS: Mental status change    Family History  Problem Relation Age of Onset  . Cancer Mother   . Heart attack Father     Past Medical History:  Diagnosis Date  . Anemia   . Ascites   . Chronic leg pain   . Cirrhosis ([place])   . GERD (gastroesophageal reflux disease)    . H/O diabetes insipidus   . Hepatitis C    Completed therapy with Epclusa ~ 2018.  suspect therapy overseen by [facility] in [place], [place]  . History of adrenal insufficiency   . History of ARDS   . History of blood transfusion   . Hypothyroidism   . Kidney disease    stage 3  . Neuropathy   . Overdose 12/2009  . Peripheral neuropathy   . Personality disorder ([place])   . PTSD (post-traumatic stress disorder)    SECONDARY TO WAR IN [place]  . Schizoaffective disorder [facility])     Past Surgical History:  Procedure Laterality Date  . BIOPSY  01/10/2019   Procedure: BIOPSY;  Surgeon: Jerene Bears, MD;  Location: [facility] ENDOSCOPY;  Service: Endoscopy;;  . CERVICAL SPINE SURGERY  unknown  . ESOPHAGOGASTRODUODENOSCOPY (EGD) WITH PROPOFOL N/A 01/10/2019   Procedure: ESOPHAGOGASTRODUODENOSCOPY (EGD) WITH PROPOFOL;  Surgeon: Jerene Bears, MD;  Location: [facility] ENDOSCOPY;  Service: Endoscopy;  Laterality: N/A;  . [place]  . US GUIDED LEFT THORACENTESIS  01/21/2010    Social History:  reports that he has been smoking cigarettes. He has a 25.00 pack-year smoking history. He has never used smokeless tobacco. He reports that he does not drink alcohol or use drugs.  Allergies: No Known Allergies  (Not in a hospital admission)   Prior to Admission medications   Medication Sig Start Date End Date Taking? Authorizing Provider  [employer] D3 2000 units CAPS Take 2,000 Units  by mouth daily. 10/23/17   [provider]  diphenhydrAMINE (BENADRYL) 25 MG tablet Take 25 mg by mouth at bedtime.     [provider]  gabapentin (NEURONTIN) 100 MG capsule Take 200 mg by mouth 3 (three) times daily. 07/21/18   [provider]  haloperidol (HALDOL) 2 MG tablet Take 1 tablet (2 mg total) by mouth 2 (two) times daily. 01/11/19   Mikhail, Nita SellsMaryann, DO  lactulose (CHRONULAC) 10 GM/15ML solution Take 45 mLs (30 g total) by mouth 2 (two) times daily as needed (To have at  least 2-3 loose bowel movements per day.). 01/11/19   Edsel PetrinMikhail, Maryann, DO  Oxycodone HCl 10 MG TABS Take 10 mg by mouth every 8 (eight) hours. 10/18/18   [provider]  pantoprazole (PROTONIX) 40 MG tablet Take 1 tablet (40 mg total) by mouth 2 (two) times daily before a meal. 01/11/19   Mikhail, Nita SellsMaryann, DO  predniSONE (DELTASONE) 50 MG tablet Take 1 tablet (50 mg total) by mouth daily. 11/26/18   Lawyer, Christopher, PA-C  SENSIPAR 30 MG tablet Take 30 mg by mouth daily. 07/25/17   [provider]  trihexyphenidyl (ARTANE) 2 MG tablet Take 1 tablet (2 mg total) by mouth 3 (three) times daily for 14 days. 01/16/19 01/30/19  Couture, Cortni S, PA-C  colchicine 0.6 MG tablet Take 1 tablet (0.6 mg total) by mouth daily. Patient not taking: Reported on 11/05/2018 08/26/18 12/31/18  Tanda RockersVenter, Margaux, PA-C    Blood pressure (!) 98/58, pulse 75, temperature 98.3 F (36.8 C), temperature source Oral, resp. rate 18, height 6\' 6"  (1.981 m), weight 99.8 kg, SpO2 98 %. Physical Exam: General: drowsy, chronically ill appearing male who is laying in bed in NAD, intermittently falling asleep HEENT: head is normocephalic, atraumatic.  Sclera are noninjected.  Pupils equal and round.  Ears and nose without any masses or lesions.  Mouth is pink and moist. Dentition fair Heart: regular, rate, and rhythm.  No obvious murmurs, gallops, or rubs noted.  Palpable pedal pulses bilaterally Lungs: CTAB, no wheezes, rhonchi, or rales noted.  Respiratory effort nonlabored Abd: generalized abdominal tenderness without peritonitis, +BS, no masses, soft partially reducible umbilical hernia without overlying skin changes, fluid wave from ascites MS: calves soft and nontender with some BLE edema L>R Skin: warm and dry with no masses, lesions, or rashes Psych: A&Ox3, drowsy Neuro: moving all 4 extremities  Results for orders placed or performed during the hospital encounter of 02/08/19 (from the past 48 hour(s))   CBC with Differential     Status: Abnormal   Collection Time: 02/08/19 11:49 AM  Result Value Ref Range   WBC 6.1 4.0 - 10.5 K/uL   RBC 2.85 (L) 4.22 - 5.81 MIL/uL   Hemoglobin 8.5 (L) 13.0 - 17.0 g/dL   HCT 30.828.4 (L) 65.739.0 - 84.652.0 %   MCV 99.6 80.0 - 100.0 fL   MCH 29.8 26.0 - 34.0 pg   MCHC 29.9 (L) 30.0 - 36.0 g/dL   RDW 96.218.0 (H) 95.211.5 - 84.115.5 %   Platelets 159 150 - 400 K/uL   nRBC 0.0 0.0 - 0.2 %   Neutrophils Relative % 69 %   Neutro Abs 4.1 1.7 - 7.7 K/uL   Lymphocytes Relative 20 %   Lymphs Abs 1.2 0.7 - 4.0 K/uL   Monocytes Relative 4 %   Monocytes Absolute 0.3 0.1 - 1.0 K/uL   Eosinophils Relative 6 %   Eosinophils Absolute 0.4 0.0 - 0.5 K/uL  Basophils Relative 1 %   Basophils Absolute 0.1 0.0 - 0.1 K/uL   Immature Granulocytes 0 %   Abs Immature Granulocytes 0.01 0.00 - 0.07 K/uL    Comment: Performed at [facility], [address] [place], [place] [address]  Comprehensive metabolic panel     Status: Abnormal   Collection Time: 02/08/19 11:49 AM  Result Value Ref Range   Sodium 140 135 - 145 mmol/L   Potassium 4.0 3.5 - 5.1 mmol/L   Chloride 112 (H) 98 - 111 mmol/L   CO2 19 (L) 22 - 32 mmol/L   Glucose, Bld 91 70 - 99 mg/dL   BUN 26 (H) 6 - 20 mg/dL   Creatinine, Ser 0.24 (H) 0.61 - 1.24 mg/dL   Calcium 7.9 (L) 8.9 - 10.3 mg/dL   Total Protein 4.7 (L) 6.5 - 8.1 g/dL   Albumin 2.2 (L) 3.5 - 5.0 g/dL   AST 15 15 - 41 U/L   ALT 8 0 - 44 U/L   Alkaline Phosphatase 59 38 - 126 U/L   Total Bilirubin 0.9 0.3 - 1.2 mg/dL   GFR calc non Af Amer 36 (L) >60 mL/min   GFR calc Af Amer 42 (L) >60 mL/min   Anion gap 9 5 - 15    Comment: Performed at [facility], [address], [place], [place] [address]  Type and screen [facility]     Status: None   Collection Time: 02/08/19 11:49 AM  Result Value Ref Range   ABO/RH(D) A POS    Antibody Screen NEG    Sample Expiration      02/11/2019,2359 Performed at [facility], [address] [place], [place] [address]   Protime-INR     Status: Abnormal   Collection Time: 02/08/19  3:22 PM  Result Value Ref Range   Prothrombin Time 19.1 (H) 11.4 - 15.2 seconds   INR 1.6 (H) 0.8 - 1.2    Comment: (NOTE) INR goal varies based on device and disease states. Performed at [facility], [address] [place], [place] [address]    Ct Abdomen Pelvis W Contrast  Result Date: 02/08/2019 CLINICAL DATA:  Abdominal pain and possible rectal bleeding. EXAM: CT ABDOMEN AND PELVIS WITH CONTRAST TECHNIQUE: Multidetector CT imaging of the abdomen and pelvis was performed using the standard protocol following bolus administration of intravenous contrast. 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 Mild scattered subsegmental atelectasis. No other acute abnormalities in the lower chest. Hepatobiliary: The shrunken nodular liver is consistent with cirrhosis, unchanged. No focal mass. The portal vein remains patent. Cholelithiasis is identified. Pancreas: Pancreas is normal. Spleen: Splenomegaly is again identified. Adrenals/Urinary Tract: Adrenal glands are normal. Renal cysts are again identified. No hydronephrosis or perinephric stranding. No ureterectasis or ureteral stones. Bladder is unremarkable. Stomach/Bowel: The stomach and small bowel are normal. Small bowel loops herniated into a periumbilical hernia with no evidence of obstruction. Wall thickening associated with the cecum and proximal ascending colon is not excluded. The appearance could be due to surrounding ascites and lack of distention. The remainder of the colon is normal. The appendix is not seen but there is no secondary evidence of appendicitis. Vascular/Lymphatic: Atherosclerotic changes are seen in the nonaneurysmal aorta. Varicose sees are again seen throughout the upper abdomen consistent with known cirrhosis. No  retroperitoneal adenopathy. Prominent nodes in the  mesentery are likely due to cirrhosis and not significantly changed. No other evidence of adenopathy. Reproductive: Prostate is unremarkable. Other: Severe ascites throughout the abdomen is severely worsened in the interval. Free air is identified. No source is noted. Musculoskeletal: Chronic changes to the right hip are identified consistent with previous trauma. A compression fracture of L1 is stable and nonacute. No other acute bony abnormalities. IMPRESSION: 1. There is free air in the abdomen. A definitive source is not identified. 2. Cirrhosis with numerous varices, severe ascites, and splenomegaly. 3. Cholelithiasis. 4. Small bowel loops are herniated into a periumbilical hernia with no evidence of obstruction or wall thickening. 5. The wall of the cecum and ascending colon appears somewhat prominent. The finding could be due to surrounding ascites and lack of distension. The finding could also be secondary to the patient's known cirrhosis and portal venous hypertension. 6. Atherosclerosis in the aorta. Prominent mesenteric nodes, likely secondary to cirrhosis. The findings were called to Dr. Rubin PayorPickering. Electronically Signed   By: Gerome Samavid  Williams III M.D   On: 02/08/2019 15:09      Assessment/Plan Schizoaffective disorder Chronic pain Child B cirrhosis Hepatitis C CKD  Small amount pneumoperitoneum GI bleed - Patient with known gastric and duodenal ulcers, small amount of free air on CT scan. He is very high risk with surgery. No peritonitis on exam. Recommend medical admission, bowel rest, IV antibiotics. GI consult. Will continue to follow.  ID - zosyn VTE - SCDs FEN - IVF, NPO Foley - none Follow up - TBD  [name], [facility] 02/08/2019, 4:00 PM Please see Amion for pager number during day hours 7:00am-4:30pm

## 2019-02-08 NOTE — H&P (Signed)
History and Physical    [name] ZOX:[id number] DOB: 06/27/1960 DOA: 02/08/2019  PCP: Fleet ContrasAvbuere, Edwin, MD  Patient coming from: Home I have personally briefly reviewed patient's old medical records [facility]  Chief Complaint: Abdominal pain and distention and rectal bleeding  HPI: [name] is a 58 y.o. male with medical history significant of cirrhosis with splenomegaly, esophageal varices, hepatitis C, adrenal insufficiency, hypothyroidism, schizoaffective disorder, PTSD, GI bleed, neuropathy, chronic kidney disease presents to emergency department due to worsening abdominal pain, distention and rectal bleeding since 1 day.  Upon my evaluation: Patient received morphine and Ativan in the ED.  He is sleepy but arousable.  Reports severe abdominal pain, distention and rectal bleeding started yesterday.  Denies nausea, vomiting, fever, chills, chest pain, shortness of breath, palpitation, hematemesis, decreased appetite.  He had EGD on 01/10/2019 which showed  Grade D acute esophagitis, Very large, non-bleeding gastric ulcer in antrum with no stigmata of bleeding. Non-bleeding duodenal ulcers.  ED Course: Upon arrival: Patient's blood pressure found to be low.  Hemoglobin dropped from 9.7-8.5.  CT abdomen/pelvis revealed free air in abdomen.  General surgery and GI consulted.  Review of Systems: As per HPI otherwise negative.    Past Medical History:  Diagnosis Date  . Anemia   . Ascites   . Chronic leg pain   . Cirrhosis (HCC)   . GERD (gastroesophageal reflux disease)   . H/O diabetes insipidus   . Hepatitis C    Completed therapy with Epclusa ~ 2018.  suspect therapy over[place] [facility] [place]  . History of adrenal insufficiency   . History of ARDS   . History of blood transfusion   . Hypothyroidism   . Kidney disease    stage 3  . Neuropathy   . Overdose 12/2009  . Peripheral neuropathy   . Personality disorder (HCC)   . PTSD  (post-traumatic stress disorder)    [place]  . Schizoaffective disorder [facility](HCC)     Past Surgical History:  Procedure Laterality Date  . BIOPSY  01/10/2019   Procedure: BIOPSY;  Surgeon: Beverley FiedlerPyrtle, Jay M, MD;  [facility];  Service: Endoscopy;;  . CERVICAL SPINE SURGERY  unknown  . ESOPHAGOGASTRODUODENOSCOPY (EGD) WITH PROPOFOL N/A 01/10/2019   Procedure: ESOPHAGOGASTRODUODENOSCOPY (EGD) WITH PROPOFOL;  Surgeon: Beverley FiedlerPyrtle, Jay M, MD;  Location: [facility] ENDOSCOPY;  Service: Endoscopy;  Laterality: N/A;  . RIGHT HIP SURGERY  1996  . US GUIDED LEFT THORACENTESIS  01/21/2010     reports that he has been smoking cigarettes. He has a 25.00 pack-year smoking history. He has never used smokeless tobacco. He reports that he does not drink alcohol or use drugs.  No Known Allergies  Family History  Problem Relation Age of Onset  . Cancer Mother   . Heart attack Father     Prior to Admission medications   Medication Sig Start Date End Date Taking? Authorizing Provider  [facility] D3 2000 units CAPS Take 2,000 Units by mouth daily. 10/23/17   [provider]  diphenhydrAMINE (BENADRYL) 25 MG tablet Take 25 mg by mouth at bedtime.     [provider]  gabapentin (NEURONTIN) 100 MG capsule Take 200 mg by mouth 3 (three) times daily. 07/21/18   [provider]  haloperidol (HALDOL) 2 MG tablet Take 1 tablet (2 mg total) by mouth 2 (two) times daily. 01/11/19   Mikhail, Nita SellsMaryann, DO  lactulose (CHRONULAC) 10 GM/15ML solution Take 45 mLs (  30 g total) by mouth 2 (two) times daily as needed (To have at least 2-3 loose bowel movements per day.). 01/11/19   Edsel Petrin, DO  Oxycodone HCl 10 MG TABS Take 10 mg by mouth every 8 (eight) hours. 10/18/18   [provider]  pantoprazole (PROTONIX) 40 MG tablet Take 1 tablet (40 mg total) by mouth 2 (two) times daily before a meal. 01/11/19   Mikhail, Nita Sells, DO  predniSONE (DELTASONE) 50 MG tablet Take 1 tablet  (50 mg total) by mouth daily. 11/26/18   Lawyer, Christopher, PA-C  SENSIPAR 30 MG tablet Take 30 mg by mouth daily. 07/25/17   [provider]  trihexyphenidyl (ARTANE) 2 MG tablet Take 1 tablet (2 mg total) by mouth 3 (three) times daily for 14 days. 01/16/19 01/30/19  Couture, Cortni S, PA-C  colchicine 0.6 MG tablet Take 1 tablet (0.6 mg total) by mouth daily. Patient not taking: Reported on 11/05/2018 08/26/18 12/31/18  Tanda Rockers, PA-C    Physical Exam: Vitals:   02/08/19 1111 02/08/19 1113 02/08/19 1115 02/08/19 1415  BP:  119/68 101/74 (!) 98/58  Pulse:  78 75   Resp:  (!) 22 11 18   Temp:  98.3 F (36.8 C)    TempSrc:  Oral    SpO2:  100% 98%   Weight: 99.8 kg     Height: 6\' 6"  (1.981 m)       Constitutional: Sleepy but arousable.  Appears pale and lethargic, alert and oriented x3 eyes: PERRL, lids and conjunctivae normal ENMT: Mucous membranes are dry. Posterior pharynx clear of any exudate or lesions.Normal dentition.  Neck: normal, supple, no masses, no thyromegaly Respiratory: clear to auscultation bilaterally, no wheezing, no crackles. Normal respiratory effort. No accessory muscle use.  Cardiovascular: Regular rate and rhythm, no murmurs / rubs / gallops.  Right leg is more swollen as compared to left.  Nonerythematous, nontender.  2+ pedal pulses. No carotid bruits.  Abdomen: Umbilical hernia noted.  Generalized abdominal tenderness positive, guarding positive, no rigidity no masses palpated.  Bowel sounds: Sluggish  musculoskeletal: no clubbing / cyanosis. No joint deformity upper and lower extremities. Good ROM, no contractures. Normal muscle tone.  Skin: no rashes, lesions, ulcers. No induration Neurologic: CN 2-12 grossly intact. Sensation intact, DTR normal. Strength 5/5 in all 4.     Labs on Admission: I have personally reviewed following labs and imaging studies  CBC: Recent Labs  Lab 02/08/19 1149  WBC 6.1  NEUTROABS 4.1  HGB 8.5*  HCT 28.4*   MCV 99.6  PLT 159   Basic Metabolic Panel: Recent Labs  Lab 02/08/19 1149  NA 140  K 4.0  CL 112*  CO2 19*  GLUCOSE 91  BUN 26*  CREATININE 1.98*  CALCIUM 7.9*   GFR: Estimated Creatinine Clearance: 52.6 mL/min (A) (by C-G formula based on SCr of 1.98 mg/dL (H)). Liver Function Tests: Recent Labs  Lab 02/08/19 1149  AST 15  ALT 8  ALKPHOS 59  BILITOT 0.9  PROT 4.7*  ALBUMIN 2.2*   No results for input(s): LIPASE, AMYLASE in the last 168 hours. No results for input(s): AMMONIA in the last 168 hours. Coagulation Profile: Recent Labs  Lab 02/08/19 1522  INR 1.6*   Cardiac Enzymes: No results for input(s): CKTOTAL, CKMB, CKMBINDEX, TROPONINI in the last 168 hours. BNP (last 3 results) No results for input(s): PROBNP in the last 8760 hours. HbA1C: No results for input(s): HGBA1C in the last 72 hours. CBG: No results for  input(s): GLUCAP in the last 168 hours. Lipid Profile: No results for input(s): CHOL, HDL, LDLCALC, TRIG, CHOLHDL, LDLDIRECT in the last 72 hours. Thyroid Function Tests: No results for input(s): TSH, T4TOTAL, FREET4, T3FREE, THYROIDAB in the last 72 hours. Anemia Panel: No results for input(s): VITAMINB12, FOLATE, FERRITIN, TIBC, IRON, RETICCTPCT in the last 72 hours. Urine analysis:    Component Value Date/Time   COLORURINE YELLOW 08/07/2018 [place] 08/07/2018 0634   LABSPEC 1.009 08/07/2018 0634   PHURINE 6.0 08/07/2018 0634   GLUCOSEU NEGATIVE 08/07/2018 0634   HGBUR NEGATIVE 08/07/2018 0634   BILIRUBINUR NEGATIVE 08/07/2018 0634   KETONESUR NEGATIVE 08/07/2018 0634   PROTEINUR NEGATIVE 08/07/2018 0634   UROBILINOGEN 1.0 01/19/2013 0015   NITRITE NEGATIVE 08/07/2018 0634   LEUKOCYTESUR NEGATIVE 08/07/2018 0634    Radiological Exams on Admission: Ct Abdomen Pelvis W Contrast  Result Date: 02/08/2019 CLINICAL DATA:  Abdominal pain and possible rectal bleeding. EXAM: CT ABDOMEN AND PELVIS WITH CONTRAST TECHNIQUE:  Multidetector CT imaging of the abdomen and pelvis was performed using the standard protocol following bolus administration of intravenous contrast. CONTRAST:  13mL OMNIPAQUE IOHEXOL 300 MG/ML  SOLN COMPARISON:  Aug 07, 2018 FINDINGS: Lower chest: Paraesophageal varices are again identified. Mild scattered subsegmental atelectasis. No other acute abnormalities in the lower chest. Hepatobiliary: The shrunken nodular liver is consistent with cirrhosis, unchanged. No focal mass. The portal vein remains patent. Cholelithiasis is identified. Pancreas: Pancreas is normal. Spleen: Splenomegaly is again identified. Adrenals/Urinary Tract: Adrenal glands are normal. Renal cysts are again identified. No hydronephrosis or perinephric stranding. No ureterectasis or ureteral stones. Bladder is unremarkable. Stomach/Bowel: The stomach and small bowel are normal. Small bowel loops herniated into a periumbilical hernia with no evidence of obstruction. Wall thickening associated with the cecum and proximal ascending colon is not excluded. The appearance could be due to surrounding ascites and lack of distention. The remainder of the colon is normal. The appendix is not seen but there is no secondary evidence of appendicitis. Vascular/Lymphatic: Atherosclerotic changes are seen in the nonaneurysmal aorta. Varicose sees are again seen throughout the upper abdomen consistent with known cirrhosis. No retroperitoneal adenopathy. Prominent nodes in the mesentery are likely due to cirrhosis and not significantly changed. No other evidence of adenopathy. Reproductive: Prostate is unremarkable. Other: Severe ascites throughout the abdomen is severely worsened in the interval. Free air is identified. No source is noted. Musculoskeletal: Chronic changes to the right hip are identified consistent with previous trauma. A compression fracture of L1 is stable and nonacute. No other acute bony abnormalities. IMPRESSION: 1. There is free air in the  abdomen. A definitive source is not identified. 2. Cirrhosis with numerous varices, severe ascites, and splenomegaly. 3. Cholelithiasis. 4. Small bowel loops are herniated into a periumbilical hernia with no evidence of obstruction or wall thickening. 5. The wall of the cecum and ascending colon appears somewhat prominent. The finding could be due to surrounding ascites and lack of distension. The finding could also be secondary to the patient's known cirrhosis and portal venous hypertension. 6. Atherosclerosis in the aorta. Prominent mesenteric nodes, likely secondary to cirrhosis. The findings were called to Dr. Alvino Chapel. Electronically Signed   By: Dorise Bullion III M.D   On: 02/08/2019 15:09    EKG: Normal sinus rhythm, no ST elevation or depression noted.  Assessment/Plan Principal Problem:   Bowel perforation [facility]) Active Problems:   Schizoaffective disorder ([place])   History of adrenal insufficiency   Cirrhosis ([place])  Esophageal varices in cirrhosis (HCC)   CKD (chronic kidney disease) stage 3, GFR 30-59 ml/min   Abdominal pain   Bleeding per rectum   Bowel perforation/pneumoperitoneum: -Patient presented with generalized abdominal tenderness, distention and rectal bleeding. -CT abdomen/pelvis as above. -We will admit patient at stepdown unit for close monitoring. -We will keep him n.p.o.  Start on IV Zosyn, Dilaudid as needed for severe abdominal pain, Zofran as needed for nausea and vomiting. -EDP consulted general surgery-recommended medical management for now.  Await GI recommendations. -We will check ammonia level -Child-Pugh score: 10 points-abdominal surgery perioperative mortality: 82 % -Patient is high risk for morbidity and mortality  Cirrhosis with ascites: -May get benefit from paracentesis. -Consider IR consult for paracentesis once patient stabilizes  Bleeding per rectum/acute blood loss anemia: -Likely secondary from portal hypertension from cirrhosis -H&H  dropped from 9.7-8.5. -Monitor H&H closely and transfuse as needed. -Start on Protonix.  Avoid NSAIDs.  CKD stage III B: Stable -Monitor.  Esophageal varices and cirrhosis: -Reviewed EGD from 01/10/2019 which showed acute esophagitis and nonbleeding large gastric ulcer in antrum. -We will continue Protonix.  Avoid NSAIDs. -Monitor H&H.  History of hepatitis C:  -Reviewed previous notes.  Patient treated in 2018 at [facility].  Depression/PTSD/schizoaffective disorder: -We will hold his p.o. home meds for now.  Chronic pain syndrome: Patient takes oxycodone at home.  Will hold for now as patient is n.p.o. -Start on Dilaudid as needed for severe pain.  Chronic adrenal insufficiency: -Takes prednisone 50 mg once daily at home.  Hold for now as patient is n.p.o.  DVT prophylaxis: TED/SCD Code Status: Full code Family Communication: None present at bedside.  Plan of care discussed with patient in length and he verbalized understanding and agreed with it. Disposition Plan: TBD Consults called: General surgery and GI by EDP Admission status: Inpatient at the stepdown unit   Ollen Bowlinka R Nafeesa Dils MD [facility] Pager [phone]  If 7PM-7AM, please contact night-coverage [email or web address] Password [email or web address]  02/08/2019, 3:56 PM

## 2019-02-09 ENCOUNTER — Inpatient Hospital Stay (HOSPITAL_COMMUNITY): Payer: Medicaid Other

## 2019-02-09 ENCOUNTER — Inpatient Hospital Stay: Payer: Self-pay

## 2019-02-09 DIAGNOSIS — Z95828 Presence of other vascular implants and grafts: Secondary | ICD-10-CM

## 2019-02-09 DIAGNOSIS — D689 Coagulation defect, unspecified: Secondary | ICD-10-CM

## 2019-02-09 DIAGNOSIS — R6521 Severe sepsis with septic shock: Secondary | ICD-10-CM | POA: Diagnosis not present

## 2019-02-09 DIAGNOSIS — N179 Acute kidney failure, unspecified: Secondary | ICD-10-CM | POA: Diagnosis not present

## 2019-02-09 DIAGNOSIS — R1010 Upper abdominal pain, unspecified: Secondary | ICD-10-CM | POA: Diagnosis not present

## 2019-02-09 DIAGNOSIS — K631 Perforation of intestine (nontraumatic): Secondary | ICD-10-CM | POA: Diagnosis not present

## 2019-02-09 DIAGNOSIS — A419 Sepsis, unspecified organism: Secondary | ICD-10-CM | POA: Diagnosis not present

## 2019-02-09 DIAGNOSIS — Z789 Other specified health status: Secondary | ICD-10-CM

## 2019-02-09 DIAGNOSIS — K7031 Alcoholic cirrhosis of liver with ascites: Secondary | ICD-10-CM | POA: Diagnosis not present

## 2019-02-09 DIAGNOSIS — K668 Other specified disorders of peritoneum: Secondary | ICD-10-CM

## 2019-02-09 DIAGNOSIS — R935 Abnormal findings on diagnostic imaging of other abdominal regions, including retroperitoneum: Secondary | ICD-10-CM

## 2019-02-09 DIAGNOSIS — K625 Hemorrhage of anus and rectum: Secondary | ICD-10-CM | POA: Diagnosis not present

## 2019-02-09 LAB — CBC
HCT: 26.4 % — ABNORMAL LOW (ref 39.0–52.0)
HCT: 23.3 % — ABNORMAL LOW (ref 39.0–52.0)
Hemoglobin: 8.3 g/dL — ABNORMAL LOW (ref 13.0–17.0)
Hemoglobin: 7.2 g/dL — ABNORMAL LOW (ref 13.0–17.0)
MCH: 30.6 pg (ref 26.0–34.0)
MCH: 30 pg (ref 26.0–34.0)
MCHC: 31.4 g/dL (ref 30.0–36.0)
MCHC: 30.9 g/dL (ref 30.0–36.0)
MCV: 97.4 fL (ref 80.0–100.0)
MCV: 97.1 fL (ref 80.0–100.0)
Platelets: 113 10*3/uL — ABNORMAL LOW (ref 150–400)
Platelets: 69 10*3/uL — ABNORMAL LOW (ref 150–400)
RBC: 2.71 MIL/uL — ABNORMAL LOW (ref 4.22–5.81)
RBC: 2.4 MIL/uL — ABNORMAL LOW (ref 4.22–5.81)
RDW: 17.9 % — ABNORMAL HIGH (ref 11.5–15.5)
RDW: 17.9 % — ABNORMAL HIGH (ref 11.5–15.5)
WBC: 7.7 10*3/uL (ref 4.0–10.5)
WBC: 5 10*3/uL (ref 4.0–10.5)
nRBC: 0 % (ref 0.0–0.2)
nRBC: 0 % (ref 0.0–0.2)

## 2019-02-09 LAB — GLUCOSE, CAPILLARY
Glucose-Capillary: 69 mg/dL — ABNORMAL LOW (ref 70–99)
Glucose-Capillary: 84 mg/dL (ref 70–99)

## 2019-02-09 LAB — COMPREHENSIVE METABOLIC PANEL
ALT: 10 U/L (ref 0–44)
AST: 16 U/L (ref 15–41)
Albumin: 1.9 g/dL — ABNORMAL LOW (ref 3.5–5.0)
Alkaline Phosphatase: 57 U/L (ref 38–126)
Anion gap: 9 (ref 5–15)
BUN: 35 mg/dL — ABNORMAL HIGH (ref 6–20)
CO2: 19 mmol/L — ABNORMAL LOW (ref 22–32)
Calcium: 7.8 mg/dL — ABNORMAL LOW (ref 8.9–10.3)
Chloride: 111 mmol/L (ref 98–111)
Creatinine, Ser: 2.43 mg/dL — ABNORMAL HIGH (ref 0.61–1.24)
GFR calc Af Amer: 33 mL/min — ABNORMAL LOW (ref >60)
GFR calc non Af Amer: 28 mL/min — ABNORMAL LOW (ref >60)
Glucose, Bld: 73 mg/dL (ref 70–99)
Potassium: 4.5 mmol/L (ref 3.5–5.1)
Sodium: 139 mmol/L (ref 135–145)
Total Bilirubin: 1.3 mg/dL — ABNORMAL HIGH (ref 0.3–1.2)
Total Protein: 4.3 g/dL — ABNORMAL LOW (ref 6.5–8.1)

## 2019-02-09 LAB — BODY FLUID CELL COUNT WITH DIFFERENTIAL
Eos, Fluid: 0 %
Lymphs, Fluid: 3 %
Monocyte-Macrophage-Serous Fluid: 7 % — ABNORMAL LOW (ref 50–90)
Neutrophil Count, Fluid: 90 % — ABNORMAL HIGH (ref 0–25)
Total Nucleated Cell Count, Fluid: 5475 cu mm — ABNORMAL HIGH (ref 0–1000)

## 2019-02-09 LAB — MRSA PCR SCREENING: MRSA by PCR: NEGATIVE

## 2019-02-09 LAB — DIC (DISSEMINATED INTRAVASCULAR COAGULATION)PANEL
D-Dimer, Quant: 2.25 ug/mL-FEU — ABNORMAL HIGH (ref 0.00–0.50)
Fibrinogen: 293 mg/dL (ref 210–475)
INR: 2.3 — ABNORMAL HIGH (ref 0.8–1.2)
Platelets: 71 10*3/uL — ABNORMAL LOW (ref 150–400)
Prothrombin Time: 24.7 seconds — ABNORMAL HIGH (ref 11.4–15.2)
Smear Review: NONE SEEN
aPTT: 42 seconds — ABNORMAL HIGH (ref 24–36)

## 2019-02-09 LAB — ALBUMIN, PLEURAL OR PERITONEAL FLUID: Albumin, Fluid: <1 g/dL

## 2019-02-09 LAB — HEMOGLOBIN A1C
Hgb A1c MFr Bld: 3.9 % — ABNORMAL LOW (ref 4.8–5.6)
Mean Plasma Glucose: 65.23 mg/dL

## 2019-02-09 LAB — CBG MONITORING, ED
Glucose-Capillary: 57 mg/dL — ABNORMAL LOW (ref 70–99)
Glucose-Capillary: 91 mg/dL (ref 70–99)

## 2019-02-09 LAB — PROTEIN, PLEURAL OR PERITONEAL FLUID: Total protein, fluid: <6 g/dL

## 2019-02-09 LAB — PREPARE RBC (CROSSMATCH)

## 2019-02-09 LAB — PROTIME-INR
INR: 1.8 — ABNORMAL HIGH (ref 0.8–1.2)
Prothrombin Time: 20.6 seconds — ABNORMAL HIGH (ref 11.4–15.2)

## 2019-02-09 LAB — LACTIC ACID, PLASMA: Lactic Acid, Venous: 3.3 mmol/L (ref 0.5–1.9)

## 2019-02-09 MED ORDER — FENTANYL CITRATE (PF) 100 MCG/2ML IJ SOLN
50.0000 ug | Freq: Once | INTRAMUSCULAR | Status: AC
  Administered 2019-02-09: 50 ug via INTRAVENOUS
  Filled 2019-02-09: qty 2

## 2019-02-09 MED ORDER — MIDODRINE HCL 5 MG PO TABS
10.0000 mg | ORAL_TABLET | Freq: Three times a day (TID) | ORAL | Status: DC
  Administered 2019-02-09 – 2019-03-16 (×86): 10 mg via ORAL
  Filled 2019-02-09 (×110): qty 2

## 2019-02-09 MED ORDER — FENTANYL CITRATE (PF) 100 MCG/2ML IJ SOLN
25.0000 ug | INTRAMUSCULAR | Status: DC | PRN
  Administered 2019-02-09: 50 ug via INTRAVENOUS
  Administered 2019-02-09: 25 ug via INTRAVENOUS
  Administered 2019-02-09 – 2019-02-10 (×6): 50 ug via INTRAVENOUS
  Filled 2019-02-09 (×8): qty 2

## 2019-02-09 MED ORDER — INSULIN ASPART 100 UNIT/ML ~~LOC~~ SOLN
0.0000 [IU] | SUBCUTANEOUS | Status: DC
  Administered 2019-02-14 – 2019-02-24 (×18): 1 [IU] via SUBCUTANEOUS
  Administered 2019-02-24: 7 [IU] via SUBCUTANEOUS
  Administered 2019-02-25: 2 [IU] via SUBCUTANEOUS
  Administered 2019-02-25 – 2019-02-26 (×5): 1 [IU] via SUBCUTANEOUS
  Administered 2019-02-26: 2 [IU] via SUBCUTANEOUS
  Administered 2019-02-26: 1 [IU] via SUBCUTANEOUS
  Administered 2019-02-26: 3 [IU] via SUBCUTANEOUS
  Administered 2019-02-27: 2 [IU] via SUBCUTANEOUS
  Administered 2019-02-27: 3 [IU] via SUBCUTANEOUS
  Administered 2019-02-27: 1 [IU] via SUBCUTANEOUS
  Administered 2019-02-27 (×2): 2 [IU] via SUBCUTANEOUS
  Administered 2019-02-27: 1 [IU] via SUBCUTANEOUS
  Administered 2019-02-28: 2 [IU] via SUBCUTANEOUS
  Administered 2019-02-28: 3 [IU] via SUBCUTANEOUS
  Administered 2019-02-28 (×2): 2 [IU] via SUBCUTANEOUS
  Administered 2019-02-28 (×2): 1 [IU] via SUBCUTANEOUS
  Administered 2019-03-01: 2 [IU] via SUBCUTANEOUS
  Administered 2019-03-01 (×3): 1 [IU] via SUBCUTANEOUS

## 2019-02-09 MED ORDER — SODIUM CHLORIDE 0.9 % IV SOLN
50.0000 ug/h | INTRAVENOUS | Status: DC
  Administered 2019-02-09 (×2): 50 ug/h via INTRAVENOUS
  Filled 2019-02-09 (×3): qty 1

## 2019-02-09 MED ORDER — DEXTROSE 10 % IV SOLN
INTRAVENOUS | Status: DC
  Administered 2019-02-09: 21:00:00 via INTRAVENOUS

## 2019-02-09 MED ORDER — SODIUM CHLORIDE 0.9 % IV SOLN: INTRAVENOUS | Status: DC | PRN

## 2019-02-09 MED ORDER — SODIUM CHLORIDE 0.9 % IV SOLN
8.0000 mg/h | INTRAVENOUS | Status: DC
  Administered 2019-02-09 – 2019-02-10 (×2): 8 mg/h via INTRAVENOUS
  Filled 2019-02-09 (×3): qty 80

## 2019-02-09 MED ORDER — LACTULOSE 10 GM/15ML PO SOLN
30.0000 g | Freq: Three times a day (TID) | ORAL | Status: DC
  Administered 2019-02-09 – 2019-02-11 (×5): 30 g via ORAL
  Filled 2019-02-09 (×7): qty 45

## 2019-02-09 MED ORDER — HALOPERIDOL 1 MG PO TABS
2.0000 mg | ORAL_TABLET | Freq: Two times a day (BID) | ORAL | Status: DC
  Filled 2019-02-09: qty 2

## 2019-02-09 MED ORDER — PHENYLEPHRINE HCL-NACL 10-0.9 MG/250ML-% IV SOLN
25.0000 ug/min | INTRAVENOUS | Status: DC
  Administered 2019-02-09: 20 ug/min via INTRAVENOUS
  Filled 2019-02-09 (×2): qty 250

## 2019-02-09 MED ORDER — LACTATED RINGERS IV BOLUS
500.0000 mL | Freq: Once | INTRAVENOUS | Status: AC
  Administered 2019-02-09: 13:00:00 500 mL via INTRAVENOUS

## 2019-02-09 MED ORDER — DEXTROSE 50 % IV SOLN
1.0000 | Freq: Once | INTRAVENOUS | Status: AC
  Administered 2019-02-09: 09:00:00 50 mL via INTRAVENOUS
  Filled 2019-02-09: qty 50

## 2019-02-09 MED ORDER — HALOPERIDOL LACTATE 5 MG/ML IJ SOLN
0.5000 mg | Freq: Two times a day (BID) | INTRAMUSCULAR | Status: DC | PRN
  Administered 2019-02-10 – 2019-03-23 (×44): 0.5 mg via INTRAVENOUS
  Filled 2019-02-09 (×46): qty 1

## 2019-02-09 MED ORDER — ALBUMIN HUMAN 25 % IV SOLN
50.0000 g | Freq: Four times a day (QID) | INTRAVENOUS | Status: AC
  Administered 2019-02-09 – 2019-02-10 (×4): 50 g via INTRAVENOUS
  Filled 2019-02-09 (×2): qty 200
  Filled 2019-02-09 (×2): qty 100
  Filled 2019-02-09 (×2): qty 200
  Filled 2019-02-09 (×2): qty 100

## 2019-02-09 MED ORDER — HALOPERIDOL LACTATE 5 MG/ML IJ SOLN: 0.5000 mg | Freq: Once | INTRAMUSCULAR | Status: DC

## 2019-02-09 MED ORDER — OCTREOTIDE LOAD VIA INFUSION
50.0000 ug | Freq: Once | INTRAVENOUS | Status: AC
  Administered 2019-02-09: 50 ug via INTRAVENOUS
  Filled 2019-02-09: qty 25

## 2019-02-09 MED ORDER — DEXAMETHASONE SODIUM PHOSPHATE 10 MG/ML IJ SOLN
INTRAMUSCULAR | Status: AC
  Filled 2019-02-09: qty 1

## 2019-02-09 MED ORDER — SODIUM CHLORIDE 0.9 % IV SOLN
80.0000 mg | Freq: Once | INTRAVENOUS | Status: AC
  Administered 2019-02-09: 80 mg via INTRAVENOUS
  Filled 2019-02-09: qty 80

## 2019-02-09 MED ORDER — SODIUM CHLORIDE 0.9 % IV SOLN
250.0000 mL | INTRAVENOUS | Status: DC
  Administered 2019-02-12: 250 mL via INTRAVENOUS

## 2019-02-09 MED ORDER — LACTATED RINGERS IV SOLN
INTRAVENOUS | Status: DC
  Administered 2019-02-09 – 2019-02-10 (×3): via INTRAVENOUS

## 2019-02-09 MED ORDER — HYDROCORTISONE NA SUCCINATE PF 100 MG IJ SOLR
50.0000 mg | Freq: Four times a day (QID) | INTRAMUSCULAR | Status: DC
  Administered 2019-02-09 – 2019-02-10 (×5): 50 mg via INTRAVENOUS
  Filled 2019-02-09 (×5): qty 2

## 2019-02-09 MED ORDER — VITAMIN K1 10 MG/ML IJ SOLN
10.0000 mg | Freq: Once | INTRAVENOUS | Status: DC
  Filled 2019-02-09: qty 1

## 2019-02-09 MED ORDER — DEXTROSE IN LACTATED RINGERS 5 % IV SOLN
INTRAVENOUS | Status: DC
  Administered 2019-02-09: 09:00:00 via INTRAVENOUS

## 2019-02-09 MED ORDER — DEXTROSE 50 % IV SOLN
INTRAVENOUS | Status: AC
  Administered 2019-02-09: 17:00:00 25 g
  Filled 2019-02-09: qty 50

## 2019-02-09 MED ORDER — LACTATED RINGERS IV BOLUS
1000.0000 mL | Freq: Once | INTRAVENOUS | Status: AC
  Administered 2019-02-09: 1000 mL via INTRAVENOUS

## 2019-02-09 MED ORDER — CHLORHEXIDINE GLUCONATE CLOTH 2 % EX PADS
6.0000 | MEDICATED_PAD | Freq: Every day | CUTANEOUS | Status: DC
  Administered 2019-02-09 – 2019-02-14 (×6): 6 via TOPICAL

## 2019-02-09 MED ORDER — SODIUM CHLORIDE 0.9% IV SOLUTION
Freq: Once | INTRAVENOUS | Status: AC
  Administered 2019-02-09: 17:00:00 via INTRAVENOUS

## 2019-02-09 NOTE — Consult Note (Signed)
Chief Complaint: Patient was seen in consultation today for possible image guided GI bleed embolization.  Referring Physician(s): Doug SouJessica Zehr, PA-C  Supervising Physician: Gilmer MorWagner, Jaime  Patient Status: [name] - ED  History of Present Illness: [name] is a 58 y.o. male with a past medical history significant for PTSD, schizoaffective disorder, personality disorder, GERD, CKD, neuropathy, hypothyroidism, anemia, cirrhosis, hepatitis C s/p treatment, large duodenal ulcer and recurrent GI bleed (recently hospitalized for same 12/2018) followed by [facility] who [facility] ED on [facility]/08/19 with complaints of abdominal pain, distention and rectal bleeding x 1 day. Initial workup in the ED showed hgb 8.5, plt 159, INR 1.8. CT abd/pelvis w/contrast was obtained which was notable for free in the abdomen without definitive source identified, cirrhosis with numerous varices, severe ascites, splenomegaly and cholelithiasis. General surgery was consulted and suspected sealed perforated duodenal ulcer - medical management was recommended given his overall poor health. A paracentesis was also recommended however the patient refused. He was hospitalized in October and during this admission underwent an EGD 01/10/19 which showed LA grade D esophagitis, 2 cm hiatal hernia, 5 cm non bleeding gastric ulcer and nonbleeding duodenal ulcers. He was transfused 2 units PRBCs, started on pantoprazole 40 mg BID and planned for outpatient follow up with GI. He was seen by [facility] on 10/28 and at that time reported feeling well without further bleeding - plan was to repeat EGD in 12 weeks. GI was consulted during this admission and has requested an IR consult for possible GI bleed embolization.   Patient seen in ED with Dr. Loreta AveWagner today - patient states "yes to procedure" as soon as we enter the room. He states that he was told about a possible procedure to stop his rectal bleeding and wants to do that as soon as  possible. He denies any abdominal pain currently but tells us that he just recently got some pain medicine. He does not have any family, his only brother unfortunately passed away during the [place] war. He does have a friend [name] here who he would like us to call regarding the procedure. Discussed indications, risks and benefits of image guided embolization with patient and he is enthusiastically agreeable to proceed if needed.   Past Medical History:  Diagnosis Date   Anemia    Ascites    Chronic leg pain    Cirrhosis (HCC)    GERD (gastroesophageal reflux disease)    H/O diabetes insipidus    Hepatitis C    Completed therapy with Ep[facility] ~ 2018.  suspect therapy overseen [facility] [place]   History of adrenal insufficiency    History of ARDS    History of blood transfusion    Hypothyroidism    Kidney disease    stage 3   Neuropathy    Overdose 12/2009   Peripheral neuropathy    Personality disorder (HCC)    PTSD (post-traumatic stress disorder)    [place]   Schizoaffective disorder [facility](HCC)     Past Surgical History:  Procedure Laterality Date   BIOPSY  01/10/2019   Procedure: BIOPSY;  Surgeon: Beverley FiedlerPyrtle, Jay M, MD;  Location: [facility] ENDOSCOPY;  Service: Endoscopy;;   CERVICAL SPINE SURGERY  unknown   ESOPHAGOGASTRODUODENOSCOPY (EGD) WITH PROPOFOL N/A 01/10/2019   Procedure: ESOPHAGOGASTRODUODENOSCOPY (EGD) WITH PROPOFOL;  Surgeon: Beverley FiedlerPyrtle, Jay M, MD;  Location: [facility] ENDOSCOPY;  Service: Endoscopy;  Laterality: N/A;   RIGHT HIP SURGERY  1996  US GUIDED LEFT THORACENTESIS  01/21/2010    Allergies: Patient has no known allergies.  Medications: Prior to Admission medications   Medication Sig Start Date End Date Taking? Authorizing Provider  [employer] D3 2000 units CAPS Take 2,000 Units by mouth daily. 10/23/17  Yes [provider]  diphenhydrAMINE (BENADRYL) 25 MG tablet Take 25 mg by mouth at bedtime.    Yes  [provider]  gabapentin (NEURONTIN) 100 MG capsule Take 200 mg by mouth 3 (three) times daily. 07/21/18  Yes [provider]  haloperidol (HALDOL) 2 MG tablet Take 1 tablet (2 mg total) by mouth 2 (two) times daily. 01/11/19  Yes Mikhail, Maryann, DO  lactulose (CHRONULAC) 10 GM/15ML solution Take 45 mLs (30 g total) by mouth 2 (two) times daily as needed (To have at least 2-3 loose bowel movements per day.). 01/11/19  Yes Mikhail, Velta Addison, DO  Oxycodone HCl 10 MG TABS Take 10 mg by mouth every 8 (eight) hours. 10/18/18  Yes [provider]  pantoprazole (PROTONIX) 40 MG tablet Take 1 tablet (40 mg total) by mouth 2 (two) times daily before a meal. 01/11/19  Yes Mikhail, Velta Addison, DO  predniSONE (DELTASONE) 50 MG tablet Take 1 tablet (50 mg total) by mouth daily. 11/26/18  Yes Lawyer, Christopher, PA-C  SENSIPAR 30 MG tablet Take 30 mg by mouth daily. 07/25/17  Yes [provider]  colchicine 0.6 MG tablet Take 1 tablet (0.6 mg total) by mouth daily. Patient not taking: Reported on 11/05/2018 08/26/18 12/31/18  Eustaquio Maize, PA-C     Family History  Problem Relation Age of Onset   Cancer Mother    Heart attack Father     Social History   Socioeconomic History   Marital status: Single    Spouse name: Not on file   Number of children: 0   Years of education: 12   Highest education level: Not on file  Occupational History    Comment: umemployed  Scientist, product/process development strain: Not on file   Food insecurity    Worry: Not on file    Inability: Not on file   Transportation needs    Medical: Not on file    Non-medical: Not on file  Tobacco Use   Smoking status: Current Every Day Smoker    Packs/day: 1.00    Years: 25.00    Pack years: 25.00    Types: Cigarettes   Smokeless tobacco: Never Used  Substance and Sexual Activity   Alcohol use: No    Comment: Pt denies    Drug use: No    Comment: Pt denies   Sexual activity:  Not Currently  Lifestyle   Physical activity    Days per week: Not on file    Minutes per session: Not on file   Stress: Not on file  Relationships   Social connections    Talks on phone: Not on file    Gets together: Not on file    Attends religious service: Not on file    Active member of club or organization: Not on file    Attends meetings of clubs or organizations: Not on file    Relationship status: Not on file  Other Topics Concern   Not on file  Social History Narrative   Single, Lives with mother   Emigrated from [place] during/after [place] Wars   Smoker, no drugs, denies recent EtOH   Unemployed   Medicaid [place] Access     Review of  Systems: A 12 point ROS discussed and pertinent positives are indicated in the HPI above.  All other systems are negative.  Review of Systems  Constitutional: Positive for fatigue. Negative for chills and fever.  Respiratory: Negative for cough and shortness of breath.   Cardiovascular: Negative for chest pain.  Gastrointestinal: Positive for abdominal pain, anal bleeding and blood in stool. Negative for diarrhea, nausea and vomiting.  Musculoskeletal: Negative for back pain.  Skin: Negative for wound.  Neurological: Negative for dizziness and headaches.    Vital Signs: BP (!) 84/55    Pulse 73    Temp 98.3 F (36.8 C) (Oral)    Resp (!) 21    Ht 6\' 6"  (1.981 m)    Wt 220 lb 0.3 oz (99.8 kg)    SpO2 97%    BMI 25.43 kg/m   Physical Exam Vitals signs and nursing note reviewed.  Constitutional:      General: He is not in acute distress.    Appearance: He is ill-appearing.     Comments: Appears older than stated age.  HENT:     Head: Normocephalic.     Mouth/Throat:     Mouth: Mucous membranes are moist.     Pharynx: Oropharynx is clear. No oropharyngeal exudate or posterior oropharyngeal erythema.     Comments: Edentulous Cardiovascular:     Rate and Rhythm: Normal rate and regular rhythm.  Pulmonary:      Effort: Pulmonary effort is normal.     Breath sounds: Normal breath sounds.  Abdominal:     General: There is distension (tense).     Tenderness: There is no abdominal tenderness.  Skin:    General: Skin is warm and dry.  Neurological:     Mental Status: He is alert and oriented to person, place, and time.  Psychiatric:        Mood and Affect: Mood normal.        Behavior: Behavior normal.        Thought Content: Thought content normal.        Judgment: Judgment normal.      MD Evaluation Airway: WNL Heart: WNL Abdomen: WNL Chest/ Lungs: WNL ASA  Classification: 3 Mallampati/Airway Score: Two   Imaging: Ct Abdomen Pelvis W Contrast  Result Date: 02/08/2019 CLINICAL DATA:  Abdominal pain and possible rectal bleeding. EXAM: CT ABDOMEN AND PELVIS WITH CONTRAST TECHNIQUE: Multidetector CT imaging of the abdomen and pelvis was performed using the standard protocol following bolus administration of intravenous contrast. 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 Mild scattered subsegmental atelectasis. No other acute abnormalities in the lower chest. Hepatobiliary: The shrunken nodular liver is consistent with cirrhosis, unchanged. No focal mass. The portal vein remains patent. Cholelithiasis is identified. Pancreas: Pancreas is normal. Spleen: Splenomegaly is again identified. Adrenals/Urinary Tract: Adrenal glands are normal. Renal cysts are again identified. No hydronephrosis or perinephric stranding. No ureterectasis or ureteral stones. Bladder is unremarkable. Stomach/Bowel: The stomach and small bowel are normal. Small bowel loops herniated into a periumbilical hernia with no evidence of obstruction. Wall thickening associated with the cecum and proximal ascending colon is not excluded. The appearance could be due to surrounding ascites and lack of distention. The remainder of the  colon is normal. The appendix is not seen but there is no secondary evidence of appendicitis. Vascular/Lymphatic: Atherosclerotic changes are seen in the nonaneurysmal aorta. Varicose sees are again seen throughout  the upper abdomen consistent with known cirrhosis. No retroperitoneal adenopathy. Prominent nodes in the mesentery are likely due to cirrhosis and not significantly changed. No other evidence of adenopathy. Reproductive: Prostate is unremarkable. Other: Severe ascites throughout the abdomen is severely worsened in the interval. Free air is identified. No source is noted. Musculoskeletal: Chronic changes to the right hip are identified consistent with previous trauma. A compression fracture of L1 is stable and nonacute. No other acute bony abnormalities. IMPRESSION: 1. There is free air in the abdomen. A definitive source is not identified. 2. Cirrhosis with numerous varices, severe ascites, and splenomegaly. 3. Cholelithiasis. 4. Small bowel loops are herniated into a periumbilical hernia with no evidence of obstruction or wall thickening. 5. The wall of the cecum and ascending colon appears somewhat prominent. The finding could be due to surrounding ascites and lack of distension. The finding could also be secondary to the patient's known cirrhosis and portal venous hypertension. 6. Atherosclerosis in the aorta. Prominent mesenteric nodes, likely secondary to cirrhosis. The findings were called to Dr. Rubin Payor. Electronically Signed   By: Gerome Sam III M.D   On: 02/08/2019 15:09   [place] Ekg Site Rite  Result Date: 02/09/2019 If Site Rite image not attached, placement could not be confirmed due to current cardiac rhythm.   Labs:  CBC: Recent Labs    01/15/19 1239 01/15/19 2326 02/08/19 1149 02/09/19 0317  WBC 7.9 THIS TEST WAS ORDERED IN ERROR AND HAS BEEN CREDITED. 6.1 7.7  HGB 9.7* THIS TEST WAS ORDERED IN ERROR AND HAS BEEN CREDITED. 8.5* 8.3*  HCT 30.2* THIS TEST WAS ORDERED IN  ERROR AND HAS BEEN CREDITED. 28.4* 26.4*  PLT 141* THIS TEST WAS ORDERED IN ERROR AND HAS BEEN CREDITED. 159 113*    COAGS: Recent Labs    08/07/18 0718 01/08/19 1424 01/09/19 0909 01/15/19 1245 02/08/19 1522 02/09/19 0317  INR 1.6* 1.6* 1.4* 1.3* 1.6* 1.8*  APTT 31 31  --   --   --   --     BMP: Recent Labs    01/15/19 1241 01/15/19 2326 02/08/19 1149 02/09/19 0317  NA 139 THIS TEST WAS ORDERED IN ERROR AND HAS BEEN CREDITED. 140 139  K 3.5 THIS TEST WAS ORDERED IN ERROR AND HAS BEEN CREDITED. 4.0 4.5  CL 114* THIS TEST WAS ORDERED IN ERROR AND HAS BEEN CREDITED. 112* 111  CO2 19* THIS TEST WAS ORDERED IN ERROR AND HAS BEEN CREDITED. 19* 19*  GLUCOSE 101* THIS TEST WAS ORDERED IN ERROR AND HAS BEEN CREDITED. 91 73  BUN 19 THIS TEST WAS ORDERED IN ERROR AND HAS BEEN CREDITED. 26* 35*  CALCIUM 9.5 THIS TEST WAS ORDERED IN ERROR AND HAS BEEN CREDITED. 7.9* 7.8*  CREATININE 1.74* THIS TEST WAS ORDERED IN ERROR AND HAS BEEN CREDITED. 1.98* 2.43*  GFRNONAA 42* THIS TEST WAS ORDERED IN ERROR AND HAS BEEN CREDITED. 36* 28*  GFRAA 49* THIS TEST WAS ORDERED IN ERROR AND HAS BEEN CREDITED. 42* 33*    LIVER FUNCTION TESTS: Recent Labs    01/15/19 1241 01/15/19 2326 02/08/19 1149 02/09/19 0317  BILITOT 0.9 THIS TEST WAS ORDERED IN ERROR AND HAS BEEN CREDITED. 0.9 1.3*  AST 15 THIS TEST WAS ORDERED IN ERROR AND HAS BEEN CREDITED. 15 16  ALT 10 THIS TEST WAS ORDERED IN ERROR AND HAS BEEN CREDITED. 8 10  ALKPHOS 64 THIS TEST WAS ORDERED IN ERROR AND HAS BEEN CREDITED. 59 57  PROT 5.0* THIS TEST WAS ORDERED  IN ERROR AND HAS BEEN CREDITED. 4.7* 4.3*  ALBUMIN 2.6* THIS TEST WAS ORDERED IN ERROR AND HAS BEEN CREDITED. 2.2* 1.9*    TUMOR MARKERS: No results for input(s): AFPTM, CEA, CA199, CHROMGRNA in the last 8760 hours.  Assessment and Plan:  58 y/o M with multiple medical problems including hepatitis C, cirrhosis, 5 cm gastric ulcer and several smaller duodenal ulcers who was  recently admitted for GI bleed which was treated medically and patient was discharged with GI follow up. He was seen in GI office on 10/28 with no complaints. [facility] with abdominal pain and rectal bleeding - CT showed free air in the abdomen thought to likely be a sealed/contained perforated ulcer. He is not currently a surgical candidate and recommendations from surgery were to treat with bowel rest and IV abx. He was seen by GI who has requested an IR consult for possible GI bleed embolization.   Patient has been reviewed by Dr. Loreta AveWagner who approves patient for potential procedure however current plan at this time is to admit patient to ICU for medical management and hold off on possible embolization until he is further evaluated by GI.   We will continue to follow along - if it is decided that this patient would benefit from an image guided embolization please call the on call IR MD.    Risks and benefits of image guided abdominal/pelvic arteriogram with intervention were discussed with the patient including, but not limited to bleeding, infection, vascular injury, contrast induced renal failure, stroke, reperfusion hemorrhage, or even death. This interventional procedure involves the use of X-rays and because of the nature of the planned procedure, it is possible that we will have prolonged use of X-ray fluoroscopy. Potential radiation risks to you include (but are not limited to) the following: - A slightly elevated risk for cancer  several years later in life. This risk is typically less than 0.5% percent. This risk is low in comparison to the normal incidence of human cancer, which is 33% for women and 50% for men according to the American Cancer Society. - Radiation induced injury can include skin redness, resembling a rash, tissue breakdown / ulcers and hair loss (which can be temporary or permanent).  The likelihood of either of these occurring depends on the  difficulty of the procedure and whether you are sensitive to radiation due to previous procedures, disease, or genetic conditions.  IF your procedure requires a prolonged use of radiation, you will be notified and given written instructions for further action.  It is your responsibility to monitor the irradiated area for the 2 weeks following the procedure and to notify your physician if you are concerned that you have suffered a radiation induced injury.    All of the patient's questions were answered, patient is agreeable to proceed.  Consent signed and in IR binder.  Thank you for this interesting consult.  I greatly enjoyed meeting Benjamin Pulsednan Colavito and look forward to participating in their care.  A copy of this report was sent to the requesting provider on this date.  Electronically Signed: Villa HerbShannon A Leeza Heiner, PA-C 02/09/2019, 2:26 PM   I spent a total of 40 Minutes  in face to face in clinical consultation, greater than 50% of which was counseling/coordinating care for possible GI bleed embolization.

## 2019-02-09 NOTE — Procedures (Signed)
Central Venous Catheter Insertion Procedure Note [name] [id number] 12-25-1960  Procedure: Insertion of Central Venous Catheter Indications: Assessment of intravascular volume, Drug and/or fluid administration and Frequent blood sampling  Procedure Details Consent: Risks of procedure as well as the alternatives and risks of each were explained to the (patient/caregiver).  Consent for procedure obtained. Time Out: Verified patient identification, verified procedure, site/side was marked, verified correct patient position, special equipment/implants available, medications/allergies/relevent history reviewed, required imaging and test results available.  Performed  Maximum sterile technique was used including antiseptics, cap, gloves, gown, hand hygiene, mask and sheet. Skin prep: Chlorhexidine; local anesthetic administered A antimicrobial bonded/coated triple lumen catheter was placed in the right internal jugular vein using the Seldinger technique.  Evaluation Blood flow good Complications: No apparent complications Patient did tolerate procedure well. Chest X-ray ordered to verify placement.  CXR: pending.  Procedure performed under direct ultrasound guidance for real time vessel cannulation.      [name], [facility] 02/09/2019, 6:23 PM

## 2019-02-09 NOTE — ED Notes (Signed)
Admit doctor at bedside

## 2019-02-09 NOTE — Progress Notes (Signed)
Interpreter line (Bosnian) used to explain PICC placement for patient. Patient has refused placement at this time.

## 2019-02-09 NOTE — Progress Notes (Signed)
[facility] Progress Note Patient Name: [name] DOB: 06-23-1960 MRN: [id number]   Date of Service  02/09/2019  HPI/Events of Note  Pt requesting Haldol 2 mg and Artane 2 mg which he states are home medications. On review of home medications list, he is indeed on Haldol but Artane is not listed.  eICU Interventions  Haldol 0.5 mg iv BID ordered, Artane was deferred.        Kerry Kass Ogan 02/09/2019, 11:19 PM

## 2019-02-09 NOTE — ED Notes (Signed)
Admitting paged due to hypotension   

## 2019-02-09 NOTE — Progress Notes (Signed)
PROGRESS NOTE    [name]  WUJ:[id number] DOB: 09/07/1960 DOA: 02/08/2019 PCP: Fleet ContrasAvbuere, Edwin, MD   Brief Narrative:  Per HPI: Benjamin Pulsednan Kilbride is a 58 y.o. male with medical history significant of cirrhosis with splenomegaly, esophageal varices, hepatitis C, adrenal insufficiency, hypothyroidism, schizoaffective disorder, PTSD, GI bleed, neuropathy, chronic kidney disease presents to emergency department due to worsening abdominal pain, distention and rectal bleeding since 1 day.  Upon my evaluation: Patient received morphine and Ativan in the ED.  He is sleepy but arousable.  Reports severe abdominal pain, distention and rectal bleeding started yesterday.  Denies nausea, vomiting, fever, chills, chest pain, shortness of breath, palpitation, hematemesis, decreased appetite.  He had EGD on 01/10/2019 which showed  Grade D acute esophagitis, Very large, non-bleeding gastric ulcer in antrum with no stigmata of bleeding. Non-bleeding duodenal ulcers.  11/13: Patient noted to have hypoglycemia as well as hypotension.  We will plan to transfuse some albumin and start IV hydrocortisone as well as start D5 IV fluid.  Will need close monitoring stepdown unit.  Discussed case with GI who recommended initiation of Protonix infusion and will evaluate further this afternoon.  Possible paracentesis soon.  Continue IV fluids and monitor labs and consider nephrology evaluation as needed by a.m.  Assessment & Plan:   Principal Problem:   Bowel perforation (HCC) Active Problems:   Schizoaffective disorder (HCC)   History of adrenal insufficiency   Cirrhosis (HCC)   Esophageal varices in cirrhosis (HCC)   CKD (chronic kidney disease) stage 3, GFR 30-59 ml/min   Abdominal pain   Bleeding per rectum   Bowel perforation/pneumoperitoneum: -Patient presented with generalized abdominal tenderness, distention and rectal bleeding. -We will admit patient at stepdown unit for close monitoring. -We will keep  him n.p.o.  Start on IV Zosyn, Dilaudid as needed for severe abdominal pain, Zofran as needed for nausea and vomiting. -Appreciate general surgery recommendations with no operative intervention planned at this time given high mortality. Continue antibiotics and bowel rest.  Paracentesis per GI. 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 -Follow labs and maintain on IV fluid -Avoid nephrotoxic agents -Consult nephrology as needed in a.m.  Esophageal varices and cirrhosis: -Reviewed EGD from 01/10/2019 which showed acute esophagitis and nonbleeding large gastric ulcer in antrum. -We will continue Protonix.  Avoid NSAIDs. -Monitor H&H.  History of hepatitis C:  -Reviewed previous notes.  Patient treated in 2018 at [facility].  Depression/PTSD/schizoaffective disorder: -We will hold his p.o. home meds for now.  Chronic pain syndrome: Patient takes oxycodone at home.  Will hold for now as patient is n.p.o. -Start on Dilaudid as needed for severe pain.  Chronic adrenal insufficiency: -Takes prednisone 50 mg once daily at home.  Currently held -Soft blood pressure readings noted and will start IV hydrocortisone 50 mg IV every 6 hours   DVT prophylaxis: SCDs Code Status: Full Family Communication: None at bedside Disposition Plan: Per GI-to consider paracentesis.   Continue  IV fluid and blood pressure support with hydrocortisone and albumin transfusion.  Continue IV antibiotics.  Patient is overall high risk with poor prognosis.   Consultants:   General surgery  GI  Procedures:   None  Antimicrobials:  Anti-infectives (From admission, onward)   Start     Dose/Rate Route Frequency Ordered Stop   02/08/19 2200  piperacillin-tazobactam (ZOSYN) IVPB 3.375 g     3.375 g 12.5 mL/hr over 240 Minutes Intravenous Every 8 hours 02/08/19 1552     02/08/19 1545  piperacillin-tazobactam (ZOSYN) IVPB 3.375 g     3.375 g 100 mL/hr over 30 Minutes Intravenous  Once 02/08/19 1533 02/08/19 1840       Subjective: Patient seen and evaluated today with no new acute complaints or concerns. No acute concerns or events noted overnight.  He is wanting to have something to drink.  He is noted to have softer blood pressure readings with some hypoglycemia noted as well.  Objective: Vitals:   02/09/19 1015 02/09/19 1105 02/09/19 1115 02/09/19 1130  BP: (!) 90/52 99/61 100/60   Pulse: 80 77 76 75  Resp: (!) 24 19 20 18   Temp:      TempSrc:      SpO2: 97% 96% 96% 97%  Weight:      Height:        Intake/Output Summary (Last 24 hours) at 02/09/2019 1213 Last data filed at 02/09/2019 0302 Gross per 24 hour  Intake 1500 ml  Output -  Net 1500 ml   Filed Weights   02/08/19 1111  Weight: 99.8 kg    Examination:  General exam: Appears calm and comfortable  Respiratory system: Clear to auscultation. Respiratory effort normal.  Currently on nasal cannula oxygen. Cardiovascular system: S1 & S2 heard, RRR. No JVD, murmurs, rubs, gallops or clicks. No pedal edema. Gastrointestinal system: Abdomen is minimally distended, soft and nontender. No organomegaly or masses felt. Normal bowel sounds heard. Central nervous system: Alert and awake Extremities: Symmetric 5 x 5 power. Skin: No rashes, lesions or ulcers Psychiatry: Difficult to assess given patient  condition.    Data Reviewed: I have personally reviewed following labs and imaging studies  CBC: Recent Labs  Lab 02/08/19 1149 02/09/19 0317  WBC 6.1 7.7  NEUTROABS 4.1  --   HGB 8.5* 8.3*  HCT 28.4* 26.4*  MCV 99.6 97.4  PLT 159 113*   Basic Metabolic Panel: Recent Labs  Lab 02/08/19 1149 02/08/19 1709 02/09/19 0317  NA 140  --  139  K 4.0  --  4.5  CL 112*  --  111  CO2 19*  --  19*  GLUCOSE 91  --  73  BUN 26*  --  35*  CREATININE 1.98*  --  2.43*  CALCIUM 7.9*  --  7.8*  MG  --  1.8  --   PHOS  --  3.8  --    GFR: Estimated Creatinine Clearance: 42.8 mL/min (A) (by C-G formula based on SCr of 2.43 mg/dL (H)). Liver Function Tests: Recent Labs  Lab 02/08/19 1149 02/09/19 0317  AST 15 16  ALT 8 10  ALKPHOS 59 57  BILITOT 0.9 1.3*  PROT 4.7* 4.3*  ALBUMIN 2.2* 1.9*   No results for input(s): LIPASE, AMYLASE in the last 168 hours. Recent Labs  Lab 02/08/19 1709  AMMONIA 72*   Coagulation Profile: Recent Labs  Lab 02/08/19 1522 02/09/19 0317  INR 1.6* 1.8*   Cardiac Enzymes: No results for input(s): CKTOTAL, CKMB,  CKMBINDEX, TROPONINI in the last 168 hours. BNP (last 3 results) No results for input(s): PROBNP in the last 8760 hours. HbA1C: No results for input(s): HGBA1C in the last 72 hours. CBG: Recent Labs  Lab 02/09/19 0755  GLUCAP 57*   Lipid Profile: No results for input(s): CHOL, HDL, LDLCALC, TRIG, CHOLHDL, LDLDIRECT in the last 72 hours. Thyroid Function Tests: No results for input(s): TSH, T4TOTAL, FREET4, T3FREE, THYROIDAB in the last 72 hours. Anemia Panel: No results for input(s): VITAMINB12, FOLATE, FERRITIN, TIBC, IRON, RETICCTPCT in the last 72 hours. Sepsis Labs: No results for input(s): PROCALCITON, LATICACIDVEN in the last 168 hours.  Recent Results (from the past 240 hour(s))  SARS CORONAVIRUS 2 (TAT 6-24 HRS) Nasopharyngeal Nasopharyngeal Swab     Status: None   Collection Time: 02/08/19  3:20 PM   Specimen:  Nasopharyngeal Swab  Result Value Ref Range Status   SARS Coronavirus 2 NEGATIVE NEGATIVE Final    Comment: (NOTE) SARS-CoV-2 target nucleic acids are NOT DETECTED. The SARS-CoV-2 RNA is generally detectable in upper and lower respiratory specimens during the acute phase of infection. Negative results do not preclude SARS-CoV-2 infection, do not rule out co-infections with other pathogens, and should not be used as the sole basis for treatment or other patient management decisions. Negative results must be combined with clinical observations, patient history, and epidemiological information. The expected result is Negative. Fact Sheet for Patients: HairSlick.no Fact Sheet for Healthcare Providers: quierodirigir.com This test is not yet approved or cleared by the [place] FDA and  has been authorized for detection and/or diagnosis of SARS-CoV-2 by FDA under an Emergency Use Authorization (EUA). This EUA will remain  in effect (meaning this test can be used) for the duration of the COVID-19 declaration under Section 56 4(b)(1) of the Act, 21 U.S.C. section 360bbb-3(b)(1), unless the authorization is terminated or revoked sooner. Performed at [facility], [address] [place], [place] [address]   Blood culture (routine x 2)     Status: None (Preliminary result)   Collection Time: 02/08/19  5:08 PM   Specimen: BLOOD RIGHT WRIST  Result Value Ref Range Status   Specimen Description BLOOD RIGHT WRIST  Final   Special Requests   Final    AEROBIC BOTTLE ONLY Blood Culture results may not be optimal due to an inadequate volume of blood received in culture bottles   Culture   Final    NO GROWTH < 24 HOURS Performed at [facility], [address] [place], [place] [address]    Report Status PENDING  Incomplete  Blood culture (routine x 2)     Status: None (Preliminary result)   Collection Time: 02/08/19  5:10 PM    Specimen: BLOOD RIGHT HAND  Result Value Ref Range Status   Specimen Description BLOOD RIGHT HAND  Final   Special Requests   Final    BOTTLES DRAWN AEROBIC AND ANAEROBIC Blood Culture results may not be optimal due to an inadequate volume of blood received in culture bottles   Culture   Final    NO GROWTH < 24 HOURS Performed at [facility], [address], [place], [place] [address]    Report Status PENDING  Incomplete         Radiology Studies: Ct Abdomen Pelvis W Contrast  Result Date: 02/08/2019 CLINICAL DATA:  Abdominal pain and possible rectal bleeding. EXAM: CT ABDOMEN AND PELVIS WITH CONTRAST TECHNIQUE: Multidetector CT imaging of the abdomen and pelvis  was performed using the standard protocol following bolus administration of intravenous contrast. CONTRAST:  1106mL OMNIPAQUE IOHEXOL 300 MG/ML  SOLN COMPARISON:  Aug 07, 2018 FINDINGS: Lower chest: Paraesophageal varices are again identified. Mild scattered subsegmental atelectasis. No other acute abnormalities in the lower chest. Hepatobiliary: The shrunken nodular liver is consistent with cirrhosis, unchanged. No focal mass. The portal vein remains patent. Cholelithiasis is identified. Pancreas: Pancreas is normal. Spleen: Splenomegaly is again identified. Adrenals/Urinary Tract: Adrenal glands are normal. Renal cysts are again identified. No hydronephrosis or perinephric stranding. No ureterectasis or ureteral stones. Bladder is unremarkable. Stomach/Bowel: The stomach and small bowel are normal. Small bowel loops herniated into a periumbilical hernia with no evidence of obstruction. Wall thickening associated with the cecum and proximal ascending colon is not excluded. The appearance could be due to surrounding ascites and lack of distention. The remainder of the colon is normal. The appendix is not seen but there is no secondary evidence of appendicitis. Vascular/Lymphatic: Atherosclerotic changes are seen in the  nonaneurysmal aorta. Varicose sees are again seen throughout the upper abdomen consistent with known cirrhosis. No retroperitoneal adenopathy. Prominent nodes in the mesentery are likely due to cirrhosis and not significantly changed. No other evidence of adenopathy. Reproductive: Prostate is unremarkable. Other: Severe ascites throughout the abdomen is severely worsened in the interval. Free air is identified. No source is noted. Musculoskeletal: Chronic changes to the right hip are identified consistent with previous trauma. A compression fracture of L1 is stable and nonacute. No other acute bony abnormalities. IMPRESSION: 1. There is free air in the abdomen. A definitive source is not identified. 2. Cirrhosis with numerous varices, severe ascites, and splenomegaly. 3. Cholelithiasis. 4. Small bowel loops are herniated into a periumbilical hernia with no evidence of obstruction or wall thickening. 5. The wall of the cecum and ascending colon appears somewhat prominent. The finding could be due to surrounding ascites and lack of distension. The finding could also be secondary to the patient's known cirrhosis and portal venous hypertension. 6. Atherosclerosis in the aorta. Prominent mesenteric nodes, likely secondary to cirrhosis. The findings were called to Dr. Alvino Chapel. Electronically Signed   By: Dorise Bullion III M.D   On: 02/08/2019 15:09   [place] Ekg Site Rite  Result Date: 02/09/2019 If Site Rite image not attached, placement could not be confirmed due to current cardiac rhythm.       Scheduled Meds: . hydrocortisone sod succinate (SOLU-CORTEF) inj  50 mg Intravenous Q6H  . insulin aspart  0-9 Units Subcutaneous Q4H   Continuous Infusions: . albumin human 50 g (02/09/19 1020)  . dextrose 5% lactated ringers 75 mL/hr at 02/09/19 0910  . pantoprozole (PROTONIX) infusion    . piperacillin-tazobactam (ZOSYN)  IV Stopped (02/09/19 1023)     LOS: 1 day    Time spent: 30 minutes     Jaquanda Wickersham Darleen Crocker, DO [facility] Pager [phone]  If 7PM-7AM, please contact night-coverage [email or web address] Password [email or web address] 02/09/2019, 12:13 PM

## 2019-02-09 NOTE — Progress Notes (Signed)
At bedside for 30 minutes with patient attempting to get consent using interpreter line.  Charge nurse at bedside during conversation.  Patient constantly asking about eating and medication for pain.  Was explained that due to his condition, he is unable to have anything.  Patient then stated that he would not be able to lie on back for the procedure.  At that point, charge nurse said that she would let bedside nurse know and call MD for other access.  Carolee Rota, RN

## 2019-02-09 NOTE — Progress Notes (Signed)
Subjective: CC: Patient more awake today. He complains of generalized abdominal pain. No N/V. Reports that he had a bloody BM this morning. No flatus. Per nursing notes, since I saw him he had an additional BM that was bloody and they placed a flexi-seal. BP has been soft overnight and this morning.   Objective: Vital signs in last 24 hours: Temp:  [98.3 F (36.8 C)] 98.3 F (36.8 C) (11/12 1113) Pulse Rate:  [70-85] 84 (11/13 0745) Resp:  [11-25] 18 (11/13 0745) BP: (79-119)/(44-83) 85/61 (11/13 0745) SpO2:  [91 %-100 %] 95 % (11/13 0745) Weight:  [99.8 kg] 99.8 kg (11/12 1111)    Intake/Output from previous day: 11/12 0701 - 11/13 0700 In: 1500 [IV Piggyback:1500] Out: -  Intake/Output this shift: No intake/output data recorded.  PE: Gen:  Alert, NAD, pleasant Lungs: Normal rate and effort  Abd: generalized abdominal tenderness without peritonitis, +BS, no masses, soft partially reducible umbilical hernia without overlying skin changes, fluid wave from ascites Ext:  LE edema BL, L > R Skin: no rashes noted, warm and dry  Lab Results:  Recent Labs    02/08/19 1149 02/09/19 0317  WBC 6.1 7.7  HGB 8.5* 8.3*  HCT 28.4* 26.4*  PLT 159 113*   BMET Recent Labs    02/08/19 1149 02/09/19 0317  NA 140 139  K 4.0 4.5  CL 112* 111  CO2 19* 19*  GLUCOSE 91 73  BUN 26* 35*  CREATININE 1.98* 2.43*  CALCIUM 7.9* 7.8*   PT/INR Recent Labs    02/08/19 1522 02/09/19 0317  LABPROT 19.1* 20.6*  INR 1.6* 1.8*   CMP     Component Value Date/Time   NA 139 02/09/2019 0317   K 4.5 02/09/2019 0317   CL 111 02/09/2019 0317   CO2 19 (L) 02/09/2019 0317   GLUCOSE 73 02/09/2019 0317   BUN 35 (H) 02/09/2019 0317   CREATININE 2.43 (H) 02/09/2019 0317   CALCIUM 7.8 (L) 02/09/2019 0317   CALCIUM 8.5 09/30/2010 0437   PROT 4.3 (L) 02/09/2019 0317   PROT 5.4 (L) 11/02/2017 1147   ALBUMIN 1.9 (L) 02/09/2019 0317   AST 16 02/09/2019 0317   ALT 10 02/09/2019 0317   ALKPHOS 57 02/09/2019 0317   BILITOT 1.3 (H) 02/09/2019 0317   GFRNONAA 28 (L) 02/09/2019 0317   GFRAA 33 (L) 02/09/2019 0317   Lipase     Component Value Date/Time   LIPASE 26 12/14/2018 0125       Studies/Results: Ct Abdomen Pelvis W Contrast  Result Date: 02/08/2019 CLINICAL DATA:  Abdominal pain and possible rectal bleeding. EXAM: CT ABDOMEN AND PELVIS WITH CONTRAST TECHNIQUE: Multidetector CT imaging of the abdomen and pelvis was performed using the standard protocol following bolus administration of intravenous contrast. 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 Mild scattered subsegmental atelectasis. No other acute abnormalities in the lower chest. Hepatobiliary: The shrunken nodular liver is consistent with cirrhosis, unchanged. No focal mass. The portal vein remains patent. Cholelithiasis is identified. Pancreas: Pancreas is normal. Spleen: Splenomegaly is again identified. Adrenals/Urinary Tract: Adrenal glands are normal. Renal cysts are again identified. No hydronephrosis or perinephric stranding. No ureterectasis or ureteral stones. Bladder is unremarkable. Stomach/Bowel: The stomach and small bowel are normal. Small bowel loops herniated into a periumbilical hernia with no evidence of obstruction. Wall thickening associated with the cecum and proximal ascending colon is  not excluded. The appearance could be due to surrounding ascites and lack of distention. The remainder of the colon is normal. The appendix is not seen but there is no secondary evidence of appendicitis. Vascular/Lymphatic: Atherosclerotic changes are seen in the nonaneurysmal aorta. Varicose sees are again seen throughout the upper abdomen consistent with known cirrhosis. No retroperitoneal adenopathy. Prominent nodes in the mesentery are likely due to cirrhosis and not significantly changed. No other evidence of  adenopathy. Reproductive: Prostate is unremarkable. Other: Severe ascites throughout the abdomen is severely worsened in the interval. Free air is identified. No source is noted. Musculoskeletal: Chronic changes to the right hip are identified consistent with previous trauma. A compression fracture of L1 is stable and nonacute. No other acute bony abnormalities. IMPRESSION: 1. There is free air in the abdomen. A definitive source is not identified. 2. Cirrhosis with numerous varices, severe ascites, and splenomegaly. 3. Cholelithiasis. 4. Small bowel loops are herniated into a periumbilical hernia with no evidence of obstruction or wall thickening. 5. The wall of the cecum and ascending colon appears somewhat prominent. The finding could be due to surrounding ascites and lack of distension. The finding could also be secondary to the patient's known cirrhosis and portal venous hypertension. 6. Atherosclerosis in the aorta. Prominent mesenteric nodes, likely secondary to cirrhosis. The findings were called to Dr. Alvino Chapel. Electronically Signed   By: Dorise Bullion III M.D   On: 02/08/2019 15:09   [place] Ekg Site Rite  Result Date: 02/09/2019 If Site Rite image not attached, placement could not be confirmed due to current cardiac rhythm.   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 I  suspect he has a sealed perforated duodenal ulcer. Given his poor overall health, recommend continuing medical management for now with bowel rest and IV abx. He has a high mortality with any surgical intervention given his severe liver disease. Recommend GI consult. He was last scoped last month. He would benefit from paracentesis as well.   ID - Zosyn VTE - SCDs FEN - IVF, NPO Foley - none Follow up - TBD   LOS: 1 day    Jillyn Ledger , [facility] Surgery 02/09/2019, 8:52 AM Please see Amion for pager number during day hours 7:00am-4:30pm

## 2019-02-09 NOTE — ED Notes (Signed)
Dr. Elsworth Soho aware of elevated creatinine and inability to obtain CTA (per CT tech)

## 2019-02-09 NOTE — ED Notes (Signed)
Surgery PA at bedside.  

## 2019-02-09 NOTE — Progress Notes (Signed)
[facility] Progress Note Patient Name: [name] DOB: Aug 09, 1960 MRN: [id number]   Date of Service  02/09/2019  HPI/Events of Note  Pt refusing foley catheterization despite hematuria and urinary retention.  eICU Interventions  Patient counseled to allow foley catheterization and eventually agrees to have it.        Kerry Kass Ogan 02/09/2019, 10:27 PM

## 2019-02-09 NOTE — Progress Notes (Signed)
Per CCM, we will hold off on A-line placement at this time. RT will be available for placement when needed.

## 2019-02-09 NOTE — Procedures (Addendum)
Paracentesis Procedure Note  Indications: large volume ascites, diagnostic and therapeutic    Procedure Details  Consent: Informed consent was obtained. Risks of the procedure were discussed including: infection, bleeding, pain, bowel perforation.  Maximum sterile technique was used including antiseptics, cap, gloves, hand hygiene, mask and sheet. Skin prep: Chlorhexidine; local anesthetic administered. Under ultrasound assistance the abdominal wall was punctured in the left lower quadrant. Fluid was obtained without any difficulties and minimal blood loss.  A dressing was applied to the wound and wound care instructions were provided.   Findings 2860 ml of cloudy ascitic fluid was obtained. A sample was sent to Pathology for cytology and cell counts, as well as for infection analysis.  Complications:  None; patient tolerated the procedure well.        Condition: stable

## 2019-02-09 NOTE — Progress Notes (Signed)
Received notice that patient was agreeable to PICC placement at this time. At bedside for PICC placement, Interpreter line open, patient unable to stay awake initial introduction. RN at bedside witnessed. Due to inability to explain benefits and risk, patient not candidate for bedside PICC placement at this time. Recommended MD placing central line if central access is needed. Will continue to monitor.

## 2019-02-09 NOTE — Consult Note (Signed)
NAME:  [name], MRN:  [id number], DOB:  10/21/1960, LOS: 1 ADMISSION DATE:  02/08/2019, CONSULTATION DATE:  02/09/2019 REFERRING MD: Sherryll BurgerShah  CHIEF COMPLAINT: Abd pain, perf ABD, sepsis    Brief History   58 yo male with bowel perforation/pneumoperitoneum non amenable to surgery d/t high risk,  hypotension and sepsis. PCCM consulted for management assistance.   History of present illness   58 y.o. male with medical history significant of cirrhosis with splenomegaly, esophageal varices, hepatitis C, adrenal insufficiency, hypothyroidism, schizoaffective disorder, PTSD, GI bleed, neuropathy, chronic kidney disease presented to emergency department due to worsening abdominal pain, distention associated with rectal bleeding for 1 day.  Work-up revealed bowel perforation/pneumoperitoneum.  Surgery consulted and recommended no operative intervention due to high mortality risk, antibiotics and bowel rest.  GI was also consulted for possible paracentesis due to significant ascites as well as dark maroon stools rectal and acute blood loss anemia.  Protonix infusion was initiated as well as octreotide as patient has history of esophageal varices.  He was admitted to stepdown status.  He remained in ER while awaiting bed.  He continued to have borderline blood pressure, progressing to hypotension.  PCCM consulted for management.  Of note: EGD from 01/10/2019 which showed acute esophagitis and nonbleeding large gastric ulcer in antrum. Has been treated for Hep C in 2019 at [facility].   Past Medical History  has Personality disorder (HCC); Normocytic anemia; Ascites; Liver disease; Hepatitis C; Kidney disease; GERD (gastroesophageal reflux disease); Schizoaffective disorder (HCC); PTSD (post-traumatic stress disorder); Hypothyroidism; History of adrenal insufficiency; H/O diabetes insipidus; Acute upper gastrointestinal bleeding; Generalized abdominal pain; Hematemesis with nausea; Evaluation by psychiatric  service required; UGIB (upper gastrointestinal bleed); Acute abdominal pain; Cirrhosis (HCC); Hematemesis; Prolonged QT interval; Esophageal varices in cirrhosis (HCC); CKD (chronic kidney disease) stage 3, GFR 30-59 ml/min; Suicidal ideation; Closed displaced fracture of neck of left fifth metacarpal bone; Acute blood loss anemia; Acute gastric ulcer; Duodenal ulcer; Acute esophagitis; Abdominal pain; Bowel perforation (HCC); Bleeding per rectum; and Septic shock (HCC) on their problem list.  Significant Hospital Events   11/12 Admitted   Consults:  PCCM, GI, Gen Surg.  Procedures:    Significant Diagnostic Tests:  11/12: CT AP.  Free air in abdomen, definitive source not identified.  Cirrhosis with numerous varices, severe ascites and splenomegaly.  Cholelithiasis.  Small bowel loops are herniated into a periumbilical hernia with no evidence of obstruction.  Prominent wall of cecum and ascending colon.  Atherosclerosis in aorta.  Prominent mesenteric nodes likely secondary to cirrhosis.  Micro Data:  SARS Coronavirus 2 negative 11/12 blood>>  Antimicrobials:  11/12 Pip-Tazo>>  Interim history/subjective:  Pt c/o ABD pain "I want my pain medicine"  Objective   Blood pressure (!) 87/46, pulse 76, temperature 98.3 F (36.8 C), temperature source Oral, resp. rate (!) 21, height 6\' 6"  (1.981 m), weight 99.8 kg, SpO2 97 %.        Intake/Output Summary (Last 24 hours) at 02/09/2019 1323 Last data filed at 02/09/2019 1000 Gross per 24 hour  Intake 1500 ml  Output 300 ml  Net 1200 ml   Filed Weights   02/08/19 1111  Weight: 99.8 kg    Examination: General: Chronically and acutely ill-appearing.  Appears much older than chronological age. HENT: Normocephalic.  Temporal wasting.  PERRL.  Dry mucus membranes Neck: No JVD. Trachea midline. No thyromegaly, no lymphadenopathy CV: RRR. No MRG. +2 distal pulses Lungs: BBS diminished at bases, faint posterior rhonchi.  ABD:  Marked  ascites. Hypoactive BS x4.  Soft.  Generalized pain to light palpation.  Umbilical hernia. No masses, guarding or rigidity GU: No Foley EXT: MAE well.  Trace edema to left lower extremity Skin: Pale, WD. In tact. No rashes or lesions Neuro: A&Ox3. No focal deficits   Assessment & Plan:   Septic shock due to bowel perforation / pneumoperitoneum - ? perf of known gastric and / or duodenal ulcers.  Evaluated by [facility] and deemed to not be a surgical candidate given high mortality. Hx adrenal insufficiency (on 50mg  prednisone daily). - Continue supportive care with bowel rest. - Start neosynephrine for goal MAP > 65. - Will re-visit and re-attempt CVL placement (pt has declined on first attempt). - Continue midodrine. - Empiric zosyn. - Stress steroids.  Cirrhosis with ascites - MELD 23, Child Pugh C. Hx esophageal varices, HCV (treated with Epclusa in 2018, followed by liver clinic at [facility]). - Will re-visit and re-attempt paracentesis if pt is agreeable (has declined on first attempt).  Anemia with GIB - ? Due to known gastric & duodenal ulcers (had EGD Oct 2020 which showed grade D esophagitis, 2cm hiatal hernia, 5cm non-bleeding gastric ulcer and non-bleeding duodenal ulcers). - Transfuse for Hgb < 7. - Continue PPI, octreotide. - GI following. - Hold off on CTA abd / pelv given AoCKD.  AoCKD - ? Hepatorenal syndrome. - Continue fluids (change to LR at 100/hr). - Follow BMP.  Hx depression, PTSD, schizoaffective disorder, chronic pain. - Low dose fentanyl PRN pain. - Hold preadmission gabapentin, haloperidol, oxycodone.  Best practice:  Diet: NPO. Pain/Anxiety/Delirium protocol (if indicated): fentanyl PRN pain. VAP protocol (if indicated): N/A. DVT prophylaxis: SCD's. GI prophylaxis: PPI gtt. Glucose control: None. Mobility: Bedrest. Code Status: Full. Family Communication: None, he has no family. Disposition: ICU.  Labs   CBC: Recent Labs  Lab 02/08/19 1149  02/09/19 0317  WBC 6.1 7.7  NEUTROABS 4.1  --   HGB 8.5* 8.3*  HCT 28.4* 26.4*  MCV 99.6 97.4  PLT 159 113*    Basic Metabolic Panel: Recent Labs  Lab 02/08/19 1149 02/08/19 1709 02/09/19 0317  NA 140  --  139  K 4.0  --  4.5  CL 112*  --  111  CO2 19*  --  19*  GLUCOSE 91  --  73  BUN 26*  --  35*  CREATININE 1.98*  --  2.43*  CALCIUM 7.9*  --  7.8*  MG  --  1.8  --   PHOS  --  3.8  --    GFR: Estimated Creatinine Clearance: 42.8 mL/min (A) (by C-G formula based on SCr of 2.43 mg/dL (H)). Recent Labs  Lab 02/08/19 1149 02/09/19 0317  WBC 6.1 7.7    Liver Function Tests: Recent Labs  Lab 02/08/19 1149 02/09/19 0317  AST 15 16  ALT 8 10  ALKPHOS 59 57  BILITOT 0.9 1.3*  PROT 4.7* 4.3*  ALBUMIN 2.2* 1.9*   No results for input(s): LIPASE, AMYLASE in the last 168 hours. Recent Labs  Lab 02/08/19 1709  AMMONIA 72*    ABG    Component Value Date/Time   PHART 7.424 02/04/2010 0840   PCO2ART 52.1 (H) 02/04/2010 0840   PO2ART 60.1 (L) 02/04/2010 0840   HCO3 33.5 (H) 02/04/2010 0840   TCO2 21 11/17/2010 2014   ACIDBASEDEF 0.7 01/20/2010 1032   O2SAT 90.5 02/04/2010 0840     Coagulation Profile: Recent Labs  Lab 02/08/19 1522 02/09/19 0317  INR 1.6* 1.8*    Cardiac Enzymes: No results for input(s): CKTOTAL, CKMB, CKMBINDEX, TROPONINI in the last 168 hours.  HbA1C: Hgb A1c MFr Bld  Date/Time Value Ref Range Status  11/02/2017 11:47 AM 4.4 (L) 4.8 - 5.6 % Final    Comment:             Prediabetes: 5.7 - 6.4          Diabetes: >6.4          Glycemic control for adults with diabetes: <7.0   02/05/2010 03:25 AM  <5.7 % Final   5.4 (NOTE)                                                                       According to the ADA Clinical Practice Recommendations for 2011, when HbA1c is used as a screening test:   >=6.5%   Diagnostic of Diabetes Mellitus           (if abnormal result  is confirmed)  5.7-6.4%   Increased risk of developing  Diabetes Mellitus  References:Diagnosis and Classification of Diabetes Mellitus,Diabetes Care,2011,34(Suppl 1):S62-S69 and Standards of Medical Care in         Diabetes - 2011,Diabetes Care,2011,34  (Suppl 1):S11-S61.    CBG: Recent Labs  Lab 02/09/19 0755 02/09/19 1212  GLUCAP 57* 91    Review of Systems:   All negative; except for those that are bolded, which indicate positives.  Constitutional: weight loss, weight gain, night sweats, fevers, chills, fatigue, weakness.  HEENT: headaches, sore throat, sneezing, nasal congestion, post nasal drip, difficulty swallowing, tooth/dental problems, visual complaints, visual changes, ear aches. Neuro: difficulty with speech, weakness, numbness, ataxia. CV:  chest pain, orthopnea, PND, swelling in lower extremities, dizziness, palpitations, syncope.  Resp: cough, hemoptysis, dyspnea, wheezing. GI: heartburn, indigestion, abdominal pain, nausea, vomiting, diarrhea, constipation, change in bowel habits, loss of appetite, hematemesis, melena, hematochezia.  GU: dysuria, change in color of urine, urgency or frequency, flank pain, hematuria. MSK: joint pain or swelling, decreased range of motion. Psych: change in mood or affect, depression, anxiety, suicidal ideations, homicidal ideations. Skin: rash, itching, bruising.   Past Medical History  He,  has a past medical history of Anemia, Ascites, Chronic leg pain, Cirrhosis (HCC), GERD (gastroesophageal reflux disease), H/O diabetes insipidus, Hepatitis C, History of adrenal insufficiency, History of ARDS, History of blood transfusion, Hypothyroidism, Kidney disease, Neuropathy, Overdose (12/2009), Peripheral neuropathy, Personality disorder (HCC), PTSD (post-traumatic stress disorder), and Schizoaffective disorder (HCC).   Surgical History    Past Surgical History:  Procedure Laterality Date  . BIOPSY  01/10/2019   Procedure: BIOPSY;  Surgeon: Beverley Fiedler, MD;  Location: [facility] ENDOSCOPY;  Service:  Endoscopy;;  . CERVICAL SPINE SURGERY  unknown  . ESOPHAGOGASTRODUODENOSCOPY (EGD) WITH PROPOFOL N/A 01/10/2019   Procedure: ESOPHAGOGASTRODUODENOSCOPY (EGD) WITH PROPOFOL;  Surgeon: Beverley Fiedler, MD;  Location: [facility] ENDOSCOPY;  Service: Endoscopy;  Laterality: N/A;  . RIGHT HIP SURGERY  1996  . US GUIDED LEFT THORACENTESIS  01/21/2010     Social History   reports that he has been smoking cigarettes. He has a 25.00 pack-year smoking history. He has never used smokeless tobacco. He reports that he does not drink alcohol or use drugs.  Family History   His family history includes Cancer in his mother; Heart attack in his father.   Allergies No Known Allergies   Home Medications  Prior to Admission medications   Medication Sig Start Date End Date Taking? Authorizing Provider  [employer] D3 2000 units CAPS Take 2,000 Units by mouth daily. 10/23/17  Yes [provider]  diphenhydrAMINE (BENADRYL) 25 MG tablet Take 25 mg by mouth at bedtime.    Yes [provider]  gabapentin (NEURONTIN) 100 MG capsule Take 200 mg by mouth 3 (three) times daily. 07/21/18  Yes [provider]  haloperidol (HALDOL) 2 MG tablet Take 1 tablet (2 mg total) by mouth 2 (two) times daily. 01/11/19  Yes Mikhail, Maryann, DO  lactulose (CHRONULAC) 10 GM/15ML solution Take 45 mLs (30 g total) by mouth 2 (two) times daily as needed (To have at least 2-3 loose bowel movements per day.). 01/11/19  Yes Mikhail, Nita Sells, DO  Oxycodone HCl 10 MG TABS Take 10 mg by mouth every 8 (eight) hours. 10/18/18  Yes [provider]  pantoprazole (PROTONIX) 40 MG tablet Take 1 tablet (40 mg total) by mouth 2 (two) times daily before a meal. 01/11/19  Yes Mikhail, Nita Sells, DO  predniSONE (DELTASONE) 50 MG tablet Take 1 tablet (50 mg total) by mouth daily. 11/26/18  Yes Lawyer, Christopher, PA-C  SENSIPAR 30 MG tablet Take 30 mg by mouth daily. 07/25/17  Yes [provider]  colchicine 0.6 MG tablet Take 1  tablet (0.6 mg total) by mouth daily. Patient not taking: Reported on 11/05/2018 08/26/18 12/31/18  Tanda Rockers, PA-C     Critical care time: 60 min.   Rutherford Guys, [place] [facility] 02/09/2019, 2:14 PM

## 2019-02-09 NOTE — Consult Note (Addendum)
Referring Provider:  Dr. Manuella Ghazi, [facility] Primary Care Physician:  Nolene Ebbs, MD Primary Gastroenterologist:  Dr. Hilarie Fredrickson  Reason for Consultation:  GI bleed  HPI: [name] is a 58 y.o. male with multiple medical problems as listed below he was just hospitalized for upper GI bleed in October at which time he underwent an EGD on October 14 that showed LA grade D esophagitis, 2 cm hiatal hernia, and a 5 cm nonbleeding gastric ulcer and nonbleeding duodenal ulcers.  During that hospitalization he received 2 units of packed red blood cells.  He has been on pantoprazole 40 mg twice daily.  I saw him in the office for follow-up on October 28 at which time he was feeling well with no more evidence of bleeding.  Plan was for repeat EGD at 12 week interval.    He presented to [facility] overnight with complaints of abdominal pain and rectal bleeding.  CT scan of the abdomen pelvis with contrast showed the following:  IMPRESSION: 1. There is free air in the abdomen. A definitive source is not identified. 2. Cirrhosis with numerous varices, severe ascites, and splenomegaly. 3. Cholelithiasis. 4. Small bowel loops are herniated into a periumbilical hernia with no evidence of obstruction or wall thickening. 5. The wall of the cecum and ascending colon appears somewhat prominent. The finding could be due to surrounding ascites and lack of distension. The finding could also be secondary to the patient's known cirrhosis and portal venous hypertension. 6. Atherosclerosis in the aorta. Prominent mesenteric nodes, likely secondary to cirrhosis.  He tells me that he is not having any abdominal pain currently.  Surgery evaluated the patient and thinks that this is likely a contained perforation from his known gastric ulcer.  Are treating him medically with IV antibiotics and bowel rest since he is not a surgical candidate.  During my visit he was putting dark maroon stools liquidy in nature from his  rectal tube.  Last hemoglobin was 8.3 g.  I spoke with the hospitalist and they are requesting that critical care see him admit him to the ICU.  Platelets low at 113 and INR elevated at 1.8.  Past Medical History:  Diagnosis Date   Anemia    Ascites    Chronic leg pain    Cirrhosis (HCC)    GERD (gastroesophageal reflux disease)    H/O diabetes insipidus    Hepatitis C    Completed therapy with Epclusa ~ 2018.  suspect therapy overseen by [facility] liver clinic in [place]   History of adrenal insufficiency    History of ARDS    History of blood transfusion    Hypothyroidism    Kidney disease    stage 3   Neuropathy    Overdose 12/2009   Peripheral neuropathy    Personality disorder (HCC)    PTSD (post-traumatic stress disorder)    SECONDARY TO WAR IN [place]   Schizoaffective disorder [facility])     Past Surgical History:  Procedure Laterality Date   BIOPSY  01/10/2019   Procedure: BIOPSY;  Surgeon: Jerene Bears, MD;  Location: [facility] ENDOSCOPY;  Service: Endoscopy;;   CERVICAL SPINE SURGERY  unknown   ESOPHAGOGASTRODUODENOSCOPY (EGD) WITH PROPOFOL N/A 01/10/2019   Procedure: ESOPHAGOGASTRODUODENOSCOPY (EGD) WITH PROPOFOL;  Surgeon: Jerene Bears, MD;  Location: [facility] ENDOSCOPY;  Service: Endoscopy;  Laterality: N/A;   RIGHT HIP SURGERY  1996   US GUIDED LEFT THORACENTESIS  01/21/2010    Prior to Admission medications  Medication Sig Start Date End Date Taking? Authorizing Provider  [employer] D3 2000 units CAPS Take 2,000 Units by mouth daily. 10/23/17  Yes [provider]  diphenhydrAMINE (BENADRYL) 25 MG tablet Take 25 mg by mouth at bedtime.    Yes [provider]  gabapentin (NEURONTIN) 100 MG capsule Take 200 mg by mouth 3 (three) times daily. 07/21/18  Yes [provider]  haloperidol (HALDOL) 2 MG tablet Take 1 tablet (2 mg total) by mouth 2 (two) times daily. 01/11/19  Yes Mikhail, Maryann, DO  lactulose (CHRONULAC) 10  GM/15ML solution Take 45 mLs (30 g total) by mouth 2 (two) times daily as needed (To have at least 2-3 loose bowel movements per day.). 01/11/19  Yes Mikhail, Nita Sells, DO  Oxycodone HCl 10 MG TABS Take 10 mg by mouth every 8 (eight) hours. 10/18/18  Yes [provider]  pantoprazole (PROTONIX) 40 MG tablet Take 1 tablet (40 mg total) by mouth 2 (two) times daily before a meal. 01/11/19  Yes Mikhail, Nita Sells, DO  predniSONE (DELTASONE) 50 MG tablet Take 1 tablet (50 mg total) by mouth daily. 11/26/18  Yes Lawyer, Christopher, PA-C  SENSIPAR 30 MG tablet Take 30 mg by mouth daily. 07/25/17  Yes [provider]  colchicine 0.6 MG tablet Take 1 tablet (0.6 mg total) by mouth daily. Patient not taking: Reported on 11/05/2018 08/26/18 12/31/18  Tanda Rockers, PA-C    Current Facility-Administered Medications  Medication Dose Route Frequency Provider Last Rate Last Dose   albumin human 25 % solution 50 g  50 g Intravenous Q6H Shah, Pratik D, DO 60 mL/hr at 02/09/19 1020 50 g at 02/09/19 1020   dextrose 5 % in lactated ringers infusion   Intravenous Continuous Maurilio Lovely D, DO 75 mL/hr at 02/09/19 0910     hydrocortisone sodium succinate (SOLU-CORTEF) 100 MG injection 50 mg  50 mg Intravenous Q6H Shah, Pratik D, DO   50 mg at 02/09/19 0857   HYDROmorphone (DILAUDID) injection 1 mg  1 mg Intravenous Q4H PRN Pahwani, Rinka R, MD   1 mg at 02/09/19 0122   insulin aspart (novoLOG) injection 0-9 Units  0-9 Units Subcutaneous Q4H Shah, Pratik D, DO       ondansetron (ZOFRAN) injection 4 mg  4 mg Intravenous Q6H PRN Pahwani, Rinka R, MD       pantoprazole (PROTONIX) 80 mg in sodium chloride 0.9 % 250 mL (0.32 mg/mL) infusion  8 mg/hr Intravenous Continuous Sherryll Burger, Pratik D, DO       piperacillin-tazobactam (ZOSYN) IVPB 3.375 g  3.375 g Intravenous Q8H Vicente Serene, RPH   Stopped at 02/09/19 1023   Current Outpatient Medications  Medication Sig Dispense Refill   CVS D3 2000 units CAPS  Take 2,000 Units by mouth daily.  5   diphenhydrAMINE (BENADRYL) 25 MG tablet Take 25 mg by mouth at bedtime.      gabapentin (NEURONTIN) 100 MG capsule Take 200 mg by mouth 3 (three) times daily.     haloperidol (HALDOL) 2 MG tablet Take 1 tablet (2 mg total) by mouth 2 (two) times daily. 60 tablet 0   lactulose (CHRONULAC) 10 GM/15ML solution Take 45 mLs (30 g total) by mouth 2 (two) times daily as needed (To have at least 2-3 loose bowel movements per day.). 236 mL 0   Oxycodone HCl 10 MG TABS Take 10 mg by mouth every 8 (eight) hours.     pantoprazole (PROTONIX) 40 MG tablet Take 1 tablet (40 mg  total) by mouth 2 (two) times daily before a meal. 60 tablet 2   predniSONE (DELTASONE) 50 MG tablet Take 1 tablet (50 mg total) by mouth daily. 5 tablet 0   SENSIPAR 30 MG tablet Take 30 mg by mouth daily.  11    Allergies as of 02/08/2019   (No Known Allergies)    Family History  Problem Relation Age of Onset   Cancer Mother    Heart attack Father     Social History   Socioeconomic History   Marital status: Single    Spouse name: Not on file   Number of children: 0   Years of education: 12   Highest education level: Not on file  Occupational History    Comment: umemployed  Ecologist strain: Not on file   Food insecurity    Worry: Not on file    Inability: Not on file   Transportation needs    Medical: Not on file    Non-medical: Not on file  Tobacco Use   Smoking status: Current Every Day Smoker    Packs/day: 1.00    Years: 25.00    Pack years: 25.00    Types: Cigarettes   Smokeless tobacco: Never Used  Substance and Sexual Activity   Alcohol use: No    Comment: Pt denies    Drug use: No    Comment: Pt denies   Sexual activity: Not Currently  Lifestyle   Physical activity    Days per week: Not on file    Minutes per session: Not on file   Stress: Not on file  Relationships   Social connections    Talks on phone:  Not on file    Gets together: Not on file    Attends religious service: Not on file    Active member of club or organization: Not on file    Attends meetings of clubs or organizations: Not on file    Relationship status: Not on file   Intimate partner violence    Fear of current or ex partner: Not on file    Emotionally abused: Not on file    Physically abused: Not on file    Forced sexual activity: Not on file  Other Topics Concern   Not on file  Social History Narrative   Single, Lives with mother   Emigrated from [place] during/after [place] Wars   Smoker, no drugs, denies recent EtOH   Unemployed   Medicaid [place] Access    Review of Systems: ROS negative except as mentioned in HPI.  Physical Exam: Vital signs in last 24 hours: Pulse Rate:  [70-85] 77 (11/13 1105) Resp:  [16-25] 19 (11/13 1105) BP: (79-103)/(42-83) 99/61 (11/13 1105) SpO2:  [91 %-100 %] 96 % (11/13 1105)   General:  Alert, Well-developed, well-nourished, pleasant and cooperative in NAD; appears pale. Head:  Normocephalic and atraumatic. Eyes:  Sclera clear, no icterus.  Conjunctiva pink. Ears:  Normal auditory acuity. Mouth:  No deformity or lesions.   Lungs:  Clear throughout to auscultation.  No wheezes, crackles, or rhonchi.  Heart:  Regular rate and rhythm; no murmurs, clicks, rubs, or gallops. Abdomen:  Soft, non-distended.  BS present.  Non-tender.  Rectal:  Deferred.  Black to deep maroon liquid stool coming into rectal tube.  Msk:  Symmetrical without gross deformities. Pulses:  Normal pulses noted. Extremities:  Without clubbing or edema. Neurologic:  Alert and oriented x 4;  grossly normal neurologically. Skin:  Intact  without significant lesions or rashes. Psych:  Alert and cooperative. Normal mood and affect.  Intake/Output from previous day: 11/12 0701 - 11/13 0700 In: 1500 [IV Piggyback:1500] Out: -   Lab Results: Recent Labs    02/08/19 1149 02/09/19 0317  WBC  6.1 7.7  HGB 8.5* 8.3*  HCT 28.4* 26.4*  PLT 159 113*   BMET Recent Labs    02/08/19 1149 02/09/19 0317  NA 140 139  K 4.0 4.5  CL 112* 111  CO2 19* 19*  GLUCOSE 91 73  BUN 26* 35*  CREATININE 1.98* 2.43*  CALCIUM 7.9* 7.8*   LFT Recent Labs    02/09/19 0317  PROT 4.3*  ALBUMIN 1.9*  AST 16  ALT 10  ALKPHOS 57  BILITOT 1.3*   PT/INR Recent Labs    02/08/19 1522 02/09/19 0317  LABPROT 19.1* 20.6*  INR 1.6* 1.8*   Studies/Results: Ct Abdomen Pelvis W Contrast  Result Date: 02/08/2019 CLINICAL DATA:  Abdominal pain and possible rectal bleeding. EXAM: CT ABDOMEN AND PELVIS WITH CONTRAST TECHNIQUE: Multidetector CT imaging of the abdomen and pelvis was performed using the standard protocol following bolus administration of intravenous contrast. CONTRAST:  100mL OMNIPAQUE IOHEXOL 300 MG/ML  SOLN COMPARISON:  Aug 07, 2018 FINDINGS: Lower chest: Paraesophageal varices are again identified. Mild scattered subsegmental atelectasis. No other acute abnormalities in the lower chest. Hepatobiliary: The shrunken nodular liver is consistent with cirrhosis, unchanged. No focal mass. The portal vein remains patent. Cholelithiasis is identified. Pancreas: Pancreas is normal. Spleen: Splenomegaly is again identified. Adrenals/Urinary Tract: Adrenal glands are normal. Renal cysts are again identified. No hydronephrosis or perinephric stranding. No ureterectasis or ureteral stones. Bladder is unremarkable. Stomach/Bowel: The stomach and small bowel are normal. Small bowel loops herniated into a periumbilical hernia with no evidence of obstruction. Wall thickening associated with the cecum and proximal ascending colon is not excluded. The appearance could be due to surrounding ascites and lack of distention. The remainder of the colon is normal. The appendix is not seen but there is no secondary evidence of appendicitis. Vascular/Lymphatic: Atherosclerotic changes are seen in the nonaneurysmal  aorta. Varicose sees are again seen throughout the upper abdomen consistent with known cirrhosis. No retroperitoneal adenopathy. Prominent nodes in the mesentery are likely due to cirrhosis and not significantly changed. No other evidence of adenopathy. Reproductive: Prostate is unremarkable. Other: Severe ascites throughout the abdomen is severely worsened in the interval. Free air is identified. No source is noted. Musculoskeletal: Chronic changes to the right hip are identified consistent with previous trauma. A compression fracture of L1 is stable and nonacute. No other acute bony abnormalities. IMPRESSION: 1. There is free air in the abdomen. A definitive source is not identified. 2. Cirrhosis with numerous varices, severe ascites, and splenomegaly. 3. Cholelithiasis. 4. Small bowel loops are herniated into a periumbilical hernia with no evidence of obstruction or wall thickening. 5. The wall of the cecum and ascending colon appears somewhat prominent. The finding could be due to surrounding ascites and lack of distension. The finding could also be secondary to the patient's known cirrhosis and portal venous hypertension. 6. Atherosclerosis in the aorta. Prominent mesenteric nodes, likely secondary to cirrhosis. The findings were called to Dr. Rubin PayorPickering. Electronically Signed   By: Gerome Samavid  Williams III M.D   On: 02/08/2019 15:09   [place] Ekg Site Rite  Result Date: 02/09/2019 If Site Rite image not attached, placement could not be confirmed due to current cardiac rhythm.  IMPRESSION:  *Abdominal pain, CT  scan shows free air in the abdomen that surgery thinks may be a sealed/contained perforated ulcer.  Not a surgical candidate so managing medically with bowel rest and IV abx (Zosyn). *GI bleeding:  Certainly may be from his known ulcer.  Recent hospitalization for upper GI bleed.  Found to have a 5 cm gastric ulcer on EGD.  Received 2 units of packed red blood cells for acute blood loss anemia.  Currently  Hgb stable at 8.3 grams.  Suspect that it will drop as he is hypotensive and  *History of cirrhosis of the liver due to hepatitis C, which was eradicated by Epclusa in 2018.  Follows with Ulysees Barnsawn Drazik, NP with liver clinic [facility] periodically as well. *Thrombocytopenia with platelets of 113 and coagulopathy with INR 1.8 due to his cirrhosis.  *Ammonia of 72 *Ascites, severe:  Can plan for paracentesis and fluid studies later.  PLAN: -PPI gtt. -Not an endoscopy candidate for EGD with pneumoperitoneum and suspect perforated ulcer. -I ordered octreotide gtt as well. -I spoke with Dr. Loreta AveWagner, interventional radiologist.  He recommended CT angio and I have placed the order stat.  They will consult on the patient as well.  Princella PellegriniJessica D. Zehr  02/09/2019, 11:27 AM  GI ATTENDING  History, laboratories, x-rays, recent endoscopy report all personally reviewed.  Patient seen and examined.  Clinical case discussed with Dr. Loreta AveWagner of interventional radiology as well as the critical care medicine team.  Agree with comprehensive consultation note as outlined above.  Impressions and recommendations as outlined below:  IMPRESSION: 1.  Hepatic cirrhosis with portal hypertension.  NO ESOPHAGEAL VARICES on recent upper endoscopy. 2.  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  Active GI bleeding with melena.  Suspect secondary to giant gastric ulcer and/or esophagitis in the face of coagulopathy 4.  CT scan of the abdomen with massive ascites and intraabdominal air concerning for perforated viscus.  Based on history, likely candidates are his gastric ulcer or the abnormal region of the colon on imaging. 5.  Massive ascites.  Rule out intra-abdominal infection, given intra-abdominal air 6.  Renal insufficiency 7.  Fair to poor prognosis  RECOMMENDATIONS: 1.  Transfer to intensive care unit setting.  Done 2.  Transfuse to keep hemoglobin greater than or equal to  8 g.  Has not received blood.  Critical care to arrange 3.  IV PPI.  Done 4.  IV octreotide (despite no varices on EGD, empiric as endoscopy high risk.  See below) 5.  Not appropriate candidate for endoscopy given the presence of intra-abdominal air.  Even a small perforated viscus male out enormous amounts of air from endoscopy leading to medical emergency (example pneumoperitoneum with tension). 6.  Broad-spectrum antibiotics.  On Zosyn 7.  Tap ascitic fluid to rule out bacterial peritonitis.  Underway 8.  Interventional radiology has seen the patient and they are on board in case bleeding remains refractory to these measures and or becomes more aggressive.  The idea would be angiogram and embolization of artery feeding the giant gastric ulcer as this is the overwhelming likely cause for his upper GI bleeding at present.  I discussed with Dr. Loreta AveWagner.  They will follow up 9.  GI will follow.  Dr. Elnoria HowardHung covering for GI this weekend  Wilhemina BonitoJohn N. Eda KeysPerry, [name], M.D. [facility] Division of Gastroenterology

## 2019-02-09 NOTE — ED Notes (Signed)
Pt was incontinent. This tech cleaned him and placed flexi-seal. Pt is clean now. Pt had bloody loose stool.

## 2019-02-09 NOTE — ED Notes (Signed)
SDU  Paged admitting DR to RN Janett Billow

## 2019-02-09 NOTE — ED Notes (Signed)
Notified admit doctor blood sugar 57 and continue to be hypotensive.

## 2019-02-09 NOTE — Plan of Care (Signed)
  Problem: Clinical Measurements: Goal: Respiratory complications will improve Outcome: Progressing Note: Pt is on room air with normal oxygen saturations   Problem: Elimination: Goal: Will not experience complications related to bowel motility Outcome: Progressing   Problem: Pain Managment: Goal: General experience of comfort will improve Outcome: Progressing Note: Pain is controlled well with current pain medicine regimen    Problem: Activity: Goal: Risk for activity intolerance will decrease Outcome: Not Progressing Note: Due to critical illness, pt is unable to mobilize at this time.   Problem: Coping: Goal: Level of anxiety will decrease Outcome: Not Progressing Note: Pt is NPO due to GI bleed

## 2019-02-10 ENCOUNTER — Inpatient Hospital Stay (HOSPITAL_COMMUNITY): Payer: Medicaid Other

## 2019-02-10 DIAGNOSIS — F259 Schizoaffective disorder, unspecified: Secondary | ICD-10-CM

## 2019-02-10 DIAGNOSIS — K7031 Alcoholic cirrhosis of liver with ascites: Secondary | ICD-10-CM

## 2019-02-10 DIAGNOSIS — N1832 Chronic kidney disease, stage 3b: Secondary | ICD-10-CM

## 2019-02-10 DIAGNOSIS — Z8639 Personal history of other endocrine, nutritional and metabolic disease: Secondary | ICD-10-CM

## 2019-02-10 DIAGNOSIS — K659 Peritonitis, unspecified: Secondary | ICD-10-CM

## 2019-02-10 DIAGNOSIS — K631 Perforation of intestine (nontraumatic): Secondary | ICD-10-CM | POA: Diagnosis not present

## 2019-02-10 DIAGNOSIS — R198 Other specified symptoms and signs involving the digestive system and abdomen: Secondary | ICD-10-CM

## 2019-02-10 LAB — GLUCOSE, CAPILLARY
Glucose-Capillary: 104 mg/dL — ABNORMAL HIGH (ref 70–99)
Glucose-Capillary: 105 mg/dL — ABNORMAL HIGH (ref 70–99)
Glucose-Capillary: 108 mg/dL — ABNORMAL HIGH (ref 70–99)
Glucose-Capillary: 109 mg/dL — ABNORMAL HIGH (ref 70–99)
Glucose-Capillary: 91 mg/dL (ref 70–99)
Glucose-Capillary: 92 mg/dL (ref 70–99)
Glucose-Capillary: 94 mg/dL (ref 70–99)

## 2019-02-10 LAB — BPAM RBC
Blood Product Expiration Date: 202012072359
Blood Product Expiration Date: 202012092359
ISSUE DATE / TIME: 202011132102
ISSUE DATE / TIME: 202011132303
Unit Type and Rh: 6200
Unit Type and Rh: 6200

## 2019-02-10 LAB — PROTIME-INR
INR: 2 — ABNORMAL HIGH (ref 0.8–1.2)
Prothrombin Time: 22.1 s — ABNORMAL HIGH (ref 11.4–15.2)

## 2019-02-10 LAB — COMPREHENSIVE METABOLIC PANEL
ALT: 10 U/L (ref 0–44)
AST: 15 U/L (ref 15–41)
Albumin: 3.1 g/dL — ABNORMAL LOW (ref 3.5–5.0)
Alkaline Phosphatase: 37 U/L — ABNORMAL LOW (ref 38–126)
Anion gap: 12 (ref 5–15)
BUN: 43 mg/dL — ABNORMAL HIGH (ref 6–20)
CO2: 20 mmol/L — ABNORMAL LOW (ref 22–32)
Calcium: 8.5 mg/dL — ABNORMAL LOW (ref 8.9–10.3)
Chloride: 108 mmol/L (ref 98–111)
Creatinine, Ser: 2.34 mg/dL — ABNORMAL HIGH (ref 0.61–1.24)
GFR calc Af Amer: 34 mL/min — ABNORMAL LOW (ref >60)
GFR calc non Af Amer: 30 mL/min — ABNORMAL LOW (ref >60)
Glucose, Bld: 114 mg/dL — ABNORMAL HIGH (ref 70–99)
Potassium: 3.9 mmol/L (ref 3.5–5.1)
Sodium: 140 mmol/L (ref 135–145)
Total Bilirubin: 2 mg/dL — ABNORMAL HIGH (ref 0.3–1.2)
Total Protein: 5.2 g/dL — ABNORMAL LOW (ref 6.5–8.1)

## 2019-02-10 LAB — TYPE AND SCREEN
ABO/RH(D): A POS
Antibody Screen: NEGATIVE
Unit division: 0
Unit division: 0

## 2019-02-10 LAB — CBC
HCT: 22.7 % — ABNORMAL LOW (ref 39.0–52.0)
Hemoglobin: 7.4 g/dL — ABNORMAL LOW (ref 13.0–17.0)
MCH: 30.2 pg (ref 26.0–34.0)
MCHC: 32.6 g/dL (ref 30.0–36.0)
MCV: 92.7 fL (ref 80.0–100.0)
Platelets: 68 K/uL — ABNORMAL LOW (ref 150–400)
RBC: 2.45 MIL/uL — ABNORMAL LOW (ref 4.22–5.81)
RDW: 18.8 % — ABNORMAL HIGH (ref 11.5–15.5)
WBC: 2.9 K/uL — ABNORMAL LOW (ref 4.0–10.5)
nRBC: 0 % (ref 0.0–0.2)

## 2019-02-10 LAB — AMMONIA: Ammonia: 32 umol/L (ref 9–35)

## 2019-02-10 LAB — URINALYSIS, ROUTINE W REFLEX MICROSCOPIC
Bilirubin Urine: NEGATIVE
Glucose, UA: NEGATIVE mg/dL
Hgb urine dipstick: NEGATIVE
Ketones, ur: NEGATIVE mg/dL
Leukocytes,Ua: NEGATIVE
Nitrite: NEGATIVE
Protein, ur: NEGATIVE mg/dL
Specific Gravity, Urine: 1.021 (ref 1.005–1.030)
pH: 5 (ref 5.0–8.0)

## 2019-02-10 LAB — CREATININE, URINE, RANDOM: Creatinine, Urine: 69.93 mg/dL

## 2019-02-10 LAB — SODIUM, URINE, RANDOM: Sodium, Ur: <10 mmol/L

## 2019-02-10 LAB — LACTIC ACID, PLASMA: Lactic Acid, Venous: 1.6 mmol/L (ref 0.5–1.9)

## 2019-02-10 MED ORDER — HYDROCORTISONE NA SUCCINATE PF 100 MG IJ SOLR
50.0000 mg | Freq: Every day | INTRAMUSCULAR | Status: AC
  Administered 2019-02-11 – 2019-02-13 (×3): 50 mg via INTRAVENOUS
  Filled 2019-02-10 (×3): qty 2

## 2019-02-10 MED ORDER — HYDROMORPHONE HCL 1 MG/ML IJ SOLN
INTRAMUSCULAR | Status: AC
  Administered 2019-02-10: 1 mg via INTRAVENOUS
  Filled 2019-02-10: qty 2

## 2019-02-10 MED ORDER — TRIHEXYPHENIDYL HCL 2 MG PO TABS
2.0000 mg | ORAL_TABLET | Freq: Once | ORAL | Status: AC
  Administered 2019-02-10: 2 mg via ORAL
  Filled 2019-02-10: qty 1

## 2019-02-10 MED ORDER — NOREPINEPHRINE 4 MG/250ML-% IV SOLN: 0.0000 ug/min | INTRAVENOUS | Status: DC

## 2019-02-10 MED ORDER — HYDROMORPHONE HCL 1 MG/ML IJ SOLN
1.0000 mg | INTRAMUSCULAR | Status: AC
  Administered 2019-02-10: 10:00:00 1 mg via INTRAVENOUS

## 2019-02-10 MED ORDER — PANTOPRAZOLE SODIUM 40 MG IV SOLR
40.0000 mg | Freq: Two times a day (BID) | INTRAVENOUS | Status: DC
  Administered 2019-02-10 – 2019-02-13 (×6): 40 mg via INTRAVENOUS
  Filled 2019-02-10 (×6): qty 40

## 2019-02-10 MED ORDER — FENTANYL CITRATE (PF) 100 MCG/2ML IJ SOLN
25.0000 ug | INTRAMUSCULAR | Status: DC | PRN
  Administered 2019-02-10 – 2019-02-11 (×12): 50 ug via INTRAVENOUS
  Administered 2019-02-12: 25 ug via INTRAVENOUS
  Administered 2019-02-12 – 2019-02-13 (×12): 50 ug via INTRAVENOUS
  Administered 2019-02-13: 25 ug via INTRAVENOUS
  Administered 2019-02-14 – 2019-02-18 (×48): 50 ug via INTRAVENOUS
  Filled 2019-02-10 (×76): qty 2

## 2019-02-10 MED ORDER — ALBUMIN HUMAN 25 % IV SOLN
50.0000 g | Freq: Four times a day (QID) | INTRAVENOUS | Status: AC
  Administered 2019-02-10 – 2019-02-11 (×4): 50 g via INTRAVENOUS
  Filled 2019-02-10: qty 150
  Filled 2019-02-10 (×2): qty 200
  Filled 2019-02-10: qty 50
  Filled 2019-02-10: qty 200

## 2019-02-10 MED ORDER — TRIHEXYPHENIDYL HCL 0.4 MG/ML PO SOLN
2.0000 mg | Freq: Once | ORAL | Status: DC
  Filled 2019-02-10: qty 5

## 2019-02-10 NOTE — Progress Notes (Addendum)
NAME:  [name], MRN:  [id number], DOB:  09/02/1960, LOS: 2 ADMISSION DATE:  02/08/2019, CONSULTATION DATE:  02/09/2019 REFERRING MD: Sherryll BurgerShah  CHIEF COMPLAINT: Abd pain, perf ABD, sepsis    Brief History   58 yo male with bowel perforation/pneumoperitoneum non amenable to surgery d/t high risk,  hypotension and sepsis. PCCM consulted for management assistance.   History of present illness   58 y.o. male with medical history significant of cirrhosis with splenomegaly, esophageal varices, hepatitis C, adrenal insufficiency, hypothyroidism, schizoaffective disorder, PTSD, GI bleed, neuropathy, chronic kidney disease presented to emergency department due to worsening abdominal pain, distention associated with rectal bleeding for 1 day.  Work-up revealed bowel perforation/pneumoperitoneum.  Surgery consulted and recommended no operative intervention due to high mortality risk, antibiotics and bowel rest.  GI was also consulted for possible paracentesis due to significant ascites as well as dark maroon stools rectal and acute blood loss anemia.  Protonix infusion was initiated as well as octreotide as patient has history of esophageal varices.  He was admitted to stepdown status.  He remained in ER while awaiting bed.  He continued to have borderline blood pressure, progressing to hypotension.  PCCM consulted for management.  Of note: EGD from 01/10/2019 which showed acute esophagitis and nonbleeding large gastric ulcer in antrum. Has been treated for Hep C in 2019 at [facility].   Past Medical History  has Personality disorder (HCC); Normocytic anemia; Ascites; Liver disease; Hepatitis C; Kidney disease; GERD (gastroesophageal reflux disease); Schizoaffective disorder (HCC); PTSD (post-traumatic stress disorder); Hypothyroidism; History of adrenal insufficiency; H/O diabetes insipidus; Acute upper gastrointestinal bleeding; Generalized abdominal pain; Hematemesis with nausea; Evaluation by psychiatric  service required; GI bleed; Acute abdominal pain; Cirrhosis (HCC); Hematemesis; Prolonged QT interval; Esophageal varices in cirrhosis (HCC); CKD (chronic kidney disease) stage 3, GFR 30-59 ml/min; Suicidal ideation; Closed displaced fracture of neck of left fifth metacarpal bone; Acute blood loss anemia; Acute gastric ulcer; Duodenal ulcer; Acute esophagitis; Abdominal pain; Bowel perforation (HCC); Bleeding per rectum; Septic shock (HCC); Intra-abdominal free air of unknown etiology; and Central venous catheter in place on their problem list.  Significant Hospital Events   11/12 Admitted   Consults:  PCCM, GI, Gen Surg.  Procedures:    Significant Diagnostic Tests:  11/12: CT AP.  Free air in abdomen, definitive source not identified.  Cirrhosis with numerous varices, severe ascites and splenomegaly.  Cholelithiasis.  Small bowel loops are herniated into a periumbilical hernia with no evidence of obstruction.  Prominent wall of cecum and ascending colon.  Atherosclerosis in aorta.  Prominent mesenteric nodes likely secondary to cirrhosis.  Micro Data:  SARS Coronavirus 2 negative 11/12 blood>>  Antimicrobials:  11/12 Pip-Tazo>>  Interim history/subjective:   Patient states he is doing okay this morning.  Complaining of abdominal pain.  Objective   Blood pressure 118/67, pulse 67, temperature 98 F (36.7 C), temperature source Axillary, resp. rate 19, height 6\' 6"  (1.981 m), weight 107.4 kg, SpO2 92 %.        Intake/Output Summary (Last 24 hours) at 02/10/2019 1209 Last data filed at 02/10/2019 1100 Gross per 24 hour  Intake 6807.84 ml  Output 1245 ml  Net 5562.84 ml   Filed Weights   02/08/19 1111 02/09/19 1534  Weight: 99.8 kg 107.4 kg    Examination: General: Chronically debilitated gentleman, in the intensive care unit, sitting up in bed HENT: NCAT, temporalis muscle wasting pupils reactive dry membranes Neck: No JVD, right IJ central line CV: Regular rate rhythm,  S1-S2 no MRG Lungs: Diminished breath sounds bilaterally, no crackles no wheeze ABD: Tenderness to palpation predominantly in the epigastrium mildly distended GU: No Foley EXT: Edema bilaterally Neuro: Alert oriented following commands   Assessment & Plan:   Septic shock due to bowel perforation / pneumoperitoneum - perf of known gastric and / or duodenal ulcers.  Evaluated by CCS and deemed to not be a surgical candidate given high mortality. Recent endoscopy with no esophageal varices. Hx adrenal insufficiency (on 50mg  prednisone daily).  I am not sure why -At this point continue supportive care and bowel rest -Vasopressors changed to norepinephrine to maintain MAP, MAP goal >54mmHg -Given additional doses of albumin -Continue midodrine plus octreotide -Continue empiric Zosyn -Decreased steroids to once daily  Cirrhosis with ascites - MELD 23, Child Pugh C. Hx esophageal varices, HCV  (treated with Epclusa in 2018, followed by liver clinic at [facility]). -This is significant comorbidity in this setting. -I have consulted palliative care to help [place] with our goals of care discussions. -Ascitic fluid consistent infective process total nucleated cells of 5475, 90% neutrophils  Anemia with GIB - Due to known gastric & duodenal ulcers (had EGD Oct 2020 which showed grade D esophagitis, 2cm hiatal hernia, 5cm non-bleeding gastric ulcer and non-bleeding duodenal ulcers). - Transfuse for hemoglobin less than 7 - Continue PPI plus octreotide drip  Acute renal failure in the setting of CKD - Also with the setting of cirrhosis, concern for HRS - dropped LR to 50cc/hr - given additional doses of albumin  -Consult placed to nephrology, we appreciate their input  Hx depression, PTSD, schizoaffective disorder, chronic pain. - Continue as needed fentanyl - Holding psych meds   Best practice:  Diet: NPO. Pain/Anxiety/Delirium protocol (if indicated): fentanyl PRN pain. VAP protocol  (if indicated): N/A. DVT prophylaxis: SCD's. GI prophylaxis: PPI gtt. Glucose control: None. Mobility: Bedrest. Code Status: Full. Family Communication: None, he has no family. Disposition: ICU.  Labs   CBC: Recent Labs  Lab 02/08/19 1149 02/09/19 0317 02/09/19 1611 02/10/19 1111  WBC 6.1 7.7 5.0 2.9*  NEUTROABS 4.1  --   --   --   HGB 8.5* 8.3* 7.2* 7.4*  HCT 28.4* 26.4* 23.3* 22.7*  MCV 99.6 97.4 97.1 92.7  PLT 159 113* 71*   69* 68*    Basic Metabolic Panel: Recent Labs  Lab 02/08/19 1149 02/08/19 1709 02/09/19 0317 02/10/19 1111  NA 140  --  139 140  K 4.0  --  4.5 3.9  CL 112*  --  111 108  CO2 19*  --  19* 20*  GLUCOSE 91  --  73 114*  BUN 26*  --  35* 43*  CREATININE 1.98*  --  2.43* 2.34*  CALCIUM 7.9*  --  7.8* 8.5*  MG  --  1.8  --   --   PHOS  --  3.8  --   --    GFR: Estimated Creatinine Clearance: 44.5 mL/min (A) (by C-G formula based on SCr of 2.34 mg/dL (H)). Recent Labs  Lab 02/08/19 1149 02/09/19 0317 02/09/19 1405 02/09/19 1611 02/10/19 1111  WBC 6.1 7.7  --  5.0 2.9*  LATICACIDVEN  --   --  3.3*  --  1.6    Liver Function Tests: Recent Labs  Lab 02/08/19 1149 02/09/19 0317 02/10/19 1111  AST 15 16 15   ALT 8 10 10   ALKPHOS 59 57 37*  BILITOT 0.9 1.3* 2.0*  PROT 4.7* 4.3* 5.2*  ALBUMIN 2.2*  1.9* 3.1*   No results for input(s): LIPASE, AMYLASE in the last 168 hours. Recent Labs  Lab 02/08/19 1709 02/10/19 1111  AMMONIA 72* 32    ABG    Component Value Date/Time   PHART 7.424 02/04/2010 0840   PCO2ART 52.1 (H) 02/04/2010 0840   PO2ART 60.1 (L) 02/04/2010 0840   HCO3 33.5 (H) 02/04/2010 0840   TCO2 21 11/17/2010 2014   ACIDBASEDEF 0.7 01/20/2010 1032   O2SAT 90.5 02/04/2010 0840     Coagulation Profile: Recent Labs  Lab 02/08/19 1522 02/09/19 0317 02/09/19 1611 02/10/19 1111  INR 1.6* 1.8* 2.3* 2.0*    Cardiac Enzymes: No results for input(s): CKTOTAL, CKMB, CKMBINDEX, TROPONINI in the last 168  hours.  HbA1C: Hgb A1c MFr Bld  Date/Time Value Ref Range Status  02/09/2019 02:19 PM 3.9 (L) 4.8 - 5.6 % Final    Comment:    (NOTE) Pre diabetes:          5.7%-6.4% Diabetes:              >6.4% Glycemic control for   <7.0% adults with diabetes   11/02/2017 11:47 AM 4.4 (L) 4.8 - 5.6 % Final    Comment:             Prediabetes: 5.7 - 6.4          Diabetes: >6.4          Glycemic control for adults with diabetes: <7.0     CBG: Recent Labs  Lab 02/09/19 2020 02/10/19 0007 02/10/19 0355 02/10/19 0836 02/10/19 1131  GLUCAP 84 105* 109* 104* 108*   Past Medical History  He,  has a past medical history of Anemia, Ascites, Chronic leg pain, Cirrhosis (HCC), GERD (gastroesophageal reflux disease), H/O diabetes insipidus, Hepatitis C, History of adrenal insufficiency, History of ARDS, History of blood transfusion, Hypothyroidism, Kidney disease, Neuropathy, Overdose (12/2009), Peripheral neuropathy, Personality disorder (HCC), PTSD (post-traumatic stress disorder), and Schizoaffective disorder (HCC).   Surgical History    Past Surgical History:  Procedure Laterality Date   BIOPSY  01/10/2019   Procedure: BIOPSY;  Surgeon: Beverley Fiedler, MD;  Location: [facility] ENDOSCOPY;  Service: Endoscopy;;   CERVICAL SPINE SURGERY  unknown   ESOPHAGOGASTRODUODENOSCOPY (EGD) WITH PROPOFOL N/A 01/10/2019   Procedure: ESOPHAGOGASTRODUODENOSCOPY (EGD) WITH PROPOFOL;  Surgeon: Beverley Fiedler, MD;  Location: [facility] ENDOSCOPY;  Service: Endoscopy;  Laterality: N/A;   RIGHT HIP SURGERY  1996   US GUIDED LEFT THORACENTESIS  01/21/2010     Social History   reports that he has been smoking cigarettes. He has a 25.00 pack-year smoking history. He has never used smokeless tobacco. He reports that he does not drink alcohol or use drugs.   Family History   His family history includes Cancer in his mother; Heart attack in his father.   Allergies No Known Allergies   Home Medications  Prior to Admission  medications   Medication Sig Start Date End Date Taking? Authorizing Provider  [employer] D3 2000 units CAPS Take 2,000 Units by mouth daily. 10/23/17  Yes [provider]  diphenhydrAMINE (BENADRYL) 25 MG tablet Take 25 mg by mouth at bedtime.    Yes [provider]  gabapentin (NEURONTIN) 100 MG capsule Take 200 mg by mouth 3 (three) times daily. 07/21/18  Yes [provider]  haloperidol (HALDOL) 2 MG tablet Take 1 tablet (2 mg total) by mouth 2 (two) times daily. 01/11/19  Yes Mikhail, Maryann, DO  lactulose (CHRONULAC) 10 GM/15ML solution  Take 45 mLs (30 g total) by mouth 2 (two) times daily as needed (To have at least 2-3 loose bowel movements per day.). 01/11/19  Yes Mikhail, Nita Sells, DO  Oxycodone HCl 10 MG TABS Take 10 mg by mouth every 8 (eight) hours. 10/18/18  Yes [provider]  pantoprazole (PROTONIX) 40 MG tablet Take 1 tablet (40 mg total) by mouth 2 (two) times daily before a meal. 01/11/19  Yes Mikhail, Nita Sells, DO  predniSONE (DELTASONE) 50 MG tablet Take 1 tablet (50 mg total) by mouth daily. 11/26/18  Yes Lawyer, Christopher, PA-C  SENSIPAR 30 MG tablet Take 30 mg by mouth daily. 07/25/17  Yes [provider]  colchicine 0.6 MG tablet Take 1 tablet (0.6 mg total) by mouth daily. Patient not taking: Reported on 11/05/2018 08/26/18 12/31/18  Tanda Rockers, PA-C    This patient is critically ill with multiple organ system failure; which, requires frequent high complexity decision making, assessment, support, evaluation, and titration of therapies. This was completed through the application of advanced monitoring technologies and extensive interpretation of multiple databases. During this encounter critical care time was devoted to patient care services described in this note for 34 minutes.  Josephine Igo, DO [facility] 02/10/2019 12:09 PM

## 2019-02-10 NOTE — Consult Note (Signed)
Renal Service Consult Note [name]  [name] 02/10/2019 [name] Requesting Physician:  Dr [name]  Reason for Consult:  Cirrhosis patient w/ AKI HPI: The patient is a 58 y.o. year-old w/ hx of cirrhosis (hep C, esoph varices, splenomegaly), adrenal insuff, schizoaffective d/o, PTSD, GI bleed , and CKD III presented to ED w/ abd pain and distention on 02/08/19.  In ED BP's were low in the 90's.  Imaging showed free air. Gen surg was consulted they recommended medical care due to very high surgical risk. Also had some GI bleed, Hb down to 8.5.  Started on PPI. Pt admitted. Creat was 1.9 on admit, 2.4 yest and 2.3 today.  He was started on midodrine/ IV alb and octreotide yesterday for suspected HRS.  BP's have improved since starting these meds now 120/ 70 range (lowest 80's).  Asked to see for renal failure.    Pt is from [place] area, fled as refuge in the 90's to [place].  Has no SOB, CP or abd pain as this time.  No hx kidney disease. Has foley in place.    I/O's are 79L in and 1.5 L out, +7.6 L total.     ROS  denies CP  no joint pain   no HA  no blurry vision  no rash  no diarrhea  no nausea/ vomiting   Past Medical History  Past Medical History:  Diagnosis Date  . Anemia   . Ascites   . Chronic leg pain   . Cirrhosis ([place])   . GERD (gastroesophageal reflux disease)   . H/O diabetes insipidus   . Hepatitis C    Completed therapy with Epclusa ~ 2018.  suspect therapy overseen by [facility] liver clinic in [place], [place]  . History of adrenal insufficiency   . History of ARDS   . History of blood transfusion   . Hypothyroidism   . Kidney disease    stage 3  . Neuropathy   . Overdose 12/2009  . Peripheral neuropathy   . Personality disorder ([place])   . PTSD (post-traumatic stress disorder)    SECONDARY TO WAR IN [place]  . Schizoaffective disorder [facility])    Past Surgical History  Past Surgical History:  Procedure Laterality Date  . BIOPSY   01/10/2019   Procedure: BIOPSY;  Surgeon: Benjamin Bears, MD;  Location: [facility] ENDOSCOPY;  Service: Endoscopy;;  . CERVICAL SPINE SURGERY  unknown  . ESOPHAGOGASTRODUODENOSCOPY (EGD) WITH PROPOFOL N/A 01/10/2019   Procedure: ESOPHAGOGASTRODUODENOSCOPY (EGD) WITH PROPOFOL;  Surgeon: Benjamin Bears, MD;  Location: [place] ENDOSCOPY;  Service: Endoscopy;  Laterality: N/A;  . [place]  . US GUIDED LEFT THORACENTESIS  01/21/2010   Family History  Family History  Problem Relation Age of Onset  . Cancer Mother   . Heart attack Father    Social History  reports that he has been smoking cigarettes. He has a 25.00 pack-year smoking history. He has never used smokeless tobacco. He reports that he does not drink alcohol or use drugs. Allergies No Known Allergies Home medications Prior to Admission medications   Medication Sig Start Date End Date Taking? Authorizing Provider  CVS D3 2000 units CAPS Take 2,000 Units by mouth daily. 10/23/17  Yes [provider]  diphenhydrAMINE (BENADRYL) 25 MG tablet Take 25 mg by mouth at bedtime.    Yes [provider]  gabapentin (NEURONTIN) 100 MG capsule Take 200 mg by mouth 3 (three) times daily. 07/21/18  Yes [provider]  haloperidol (HALDOL) 2 MG tablet Take 1 tablet (2 mg total) by mouth 2 (two) times daily. 01/11/19  Yes [name], Maryann, DO  lactulose (CHRONULAC) 10 GM/15ML solution Take 45 mLs (30 g total) by mouth 2 (two) times daily as needed (To have at least 2-3 loose bowel movements per day.). 01/11/19  Yes [name], Benjamin Addison, DO  Oxycodone HCl 10 MG TABS Take 10 mg by mouth every 8 (eight) hours. 10/18/18  Yes [provider]  pantoprazole (PROTONIX) 40 MG tablet Take 1 tablet (40 mg total) by mouth 2 (two) times daily before a meal. 01/11/19  Yes [name], Benjamin Addison, DO  predniSONE (DELTASONE) 50 MG tablet Take 1 tablet (50 mg total) by mouth daily. 11/26/18  Yes [name], Christopher, PA-C  SENSIPAR 30 MG tablet  Take 30 mg by mouth daily. 07/25/17  Yes [provider]  colchicine 0.6 MG tablet Take 1 tablet (0.6 mg total) by mouth daily. Patient not taking: Reported on 11/05/2018 08/26/18 12/31/18  Benjamin Maize, PA-C   Liver Function Tests Recent Labs  Lab 02/08/19 1149 02/09/19 0317 02/10/19 1111  AST '15 16 15  '$ ALT '8 10 10  '$ ALKPHOS 59 57 37*  BILITOT 0.9 1.3* 2.0*  PROT 4.7* 4.3* 5.2*  ALBUMIN 2.2* 1.9* 3.1*   No results for input(s): LIPASE, AMYLASE in the last 168 hours. CBC Recent Labs  Lab 02/08/19 1149 02/09/19 0317 02/09/19 1611 02/10/19 1111  WBC 6.1 7.7 5.0 2.9*  NEUTROABS 4.1  --   --   --   HGB 8.5* 8.3* 7.2* 7.4*  HCT 28.4* 26.4* 23.3* 22.7*  MCV 99.6 97.4 97.1 92.7  PLT 159 113* 71*  69* 68*   Basic Metabolic Panel Recent Labs  Lab 02/08/19 1149 02/08/19 1709 02/09/19 0317 02/10/19 1111  NA 140  --  139 140  K 4.0  --  4.5 3.9  CL 112*  --  111 108  CO2 19*  --  19* 20*  GLUCOSE 91  --  73 114*  BUN 26*  --  35* 43*  CREATININE 1.98*  --  2.43* 2.34*  CALCIUM 7.9*  --  7.8* 8.5*  PHOS  --  3.8  --   --    Iron/TIBC/Ferritin/ %Sat    Component Value Date/Time   IRON 15 (L) 08/07/2018 0800   TIBC 348 08/07/2018 0800   FERRITIN 3 (L) 08/07/2018 0800   IRONPCTSAT 4 (L) 08/07/2018 0800    Vitals:   02/10/19 1200 02/10/19 1300 02/10/19 1400 02/10/19 1550  BP: 120/68 119/73 114/79   Pulse: 61  69   Resp: 18 17 (!) 23   Temp:    98.1 F (36.7 C)  TempSrc:    Axillary  SpO2: 91%  94%   Weight:      Height:        Exam Gen alert, conversant No rash, cyanosis or gangrene Sclera anicteric, throat clear and dry  No jvd or bruits, flat neck veins Chest clear bilat to bases RRR no MRG Abd distended w/ ascites, mildly tender, no mass , +bs GU normal male w/ foley draining clear amber urine MS no joint effusions or deformity Ext 1-2+ UE edema, 1-2+ bilat LE edema, no wounds or ulcers Neuro is alert, Ox 3 , nf    Home meds:  -  gabapentin 200 tid/ oxycodone 10 tid prn/ haloperidol '2mg'$  bid  - lactulose 30gm bid prn to have 2-3 loose bm's per day  - pantoprazole  40 bid  - prednisone 50 qd  - sensipar 30 qd     Date   Creat     eGFR   08/2007- 12/2009 0.9- 1.35   12/2009- 2012 1.52- 1.99]   2013- 2017  1.81- 2.43   2019   1.95- 2.18   07/2018- 11/2018 1.70- 2.07  34- 43    12/2018  2.26 >> 1.74   11/12   1.98   36    11/13   2.43    11/ 24/20  2.34   30         UA may 2020 > negative    UA - pending     CT abd > Adrenals/Urinary Tract: Adrenal glands are normal. Renal cysts are again identified. No hydronephrosis or perinephric stranding. No ureterectasis or ureteral stones. Bladder is unremarkable.    cXR 11/13 > poor film, rotates, possible L effusion /density      Assessment/ Plan: 1. AKI on CKD 3b - baseline creat is around 1.7- 2.0, UA negative from May 2020 and kidneys show no hydro on CT scan done here 11/12 with 100 cc contrast.  Creat bumped up 11/13 possibly due to contrast but more likely due to hypotension /prob HRS.  Started on HRS protocol with midodrine/ octreotide and IV albumin and BP's have come up and creat down a bit today.  Avoid further contrast as well as acei /ARB/ nsaids.  Cont meds for HRS, get UA and urine lytes. Will follow.  2. Volume - up 7kg since admission, does not need more fluids, will dc IVF"s. If needs dextrose for hypoglycemia, use D5W.  3. Cirrhosis/ hepatitis C - on lactulose, sig ascites 4. Perforated viscous - by CT scan on admit, seen by gen surg, felt he probably had a sealed perforated ulcer since he was not septic appearing.  Medical management was suggested given his overall poor health.  5. Schizoaffective d/o 6. Hx adrenal insuff - on prednisone       Kelly Splinter  MD 02/10/2019, 5:36 PM

## 2019-02-10 NOTE — Progress Notes (Signed)
[facility]/Trauma Progress Note      Assessment/Plan Schizoaffective disorder Chronic pain [name] Hepatitis C CKD w/ AKI - Cr 2.43 ABL anemia - Hgb 7.2  Small amount pneumoperitoneum GI bleed - Patient with known gastric and duodenal ulcers, small amount of free air on CT scan. I suspect he has a sealed perforated duodenal ulcer. Given his poor overall health, recommend continuing medical management for now with bowel rest and IV abx. He has a high mortality with any surgical intervention given his severe liver disease. - S/P paracentesis 11/13, cultures pending - UGI in next 24 - 48 hours if able, If no leak, consider clear liquid diet   ID -Zosyn VTE -SCDs FEN -IVF, NPO, sips with meds ok Foley -none Follow up -TBD   LOS: 2 days    Subjective: CC: mild abdominal pain  He states only a little bit of abdominal pain. No nausea or vomiting. No issues overnight per nurse.   Objective: Vital signs in last 24 hours: Temp:  [97.6 F (36.4 C)-98.4 F (36.9 C)] 97.7 F (36.5 C) (11/14 0630) Pulse Rate:  [58-80] 68 (11/14 0645) Resp:  [15-30] 18 (11/14 0645) BP: (79-129)/(42-86) 114/56 (11/14 0645) SpO2:  [89 %-99 %] 89 % (11/14 0645) Weight:  [107.4 kg] 107.4 kg (11/13 1534) Last BM Date: 02/09/19  Intake/Output from previous day: 11/13 0701 - 11/14 0700 In: 5795.7 [I.V.:3064.3; Blood:1308; IV Piggyback:1423.4] Out: 1545 [Urine:1245; Stool:300] Intake/Output this shift: No intake/output data recorded.  PE:  Gen:  Alert, NAD, pleasant, appears older than stated age, pale Pulm:  Rate and effort normal Abd: Soft, ND, hypoactive BS, no peritonitis, mild generalized TTP without guarding Skin: warm and dry   Anti-infectives: Anti-infectives (From admission, onward)   Start     Dose/Rate Route Frequency Ordered Stop   02/08/19 2200  piperacillin-tazobactam (ZOSYN) IVPB 3.375 g     3.375 g 12.5 mL/hr over 240 Minutes Intravenous Every  8 hours 02/08/19 1552     02/08/19 1545  piperacillin-tazobactam (ZOSYN) IVPB 3.375 g     3.375 g 100 mL/hr over 30 Minutes Intravenous  Once 02/08/19 1533 02/08/19 1840      Lab Results:  Recent Labs    02/09/19 0317 02/09/19 1611  WBC 7.7 5.0  HGB 8.3* 7.2*  HCT 26.4* 23.3*  PLT 113* 71*  69*   BMET Recent Labs    02/08/19 1149 02/09/19 0317  NA 140 139  K 4.0 4.5  CL 112* 111  CO2 19* 19*  GLUCOSE 91 73  BUN 26* 35*  CREATININE 1.98* 2.43*  CALCIUM 7.9* 7.8*   PT/INR Recent Labs    02/09/19 0317 02/09/19 1611  LABPROT 20.6* 24.7*  INR 1.8* 2.3*   CMP     Component Value Date/Time   NA 139 02/09/2019 0317   K 4.5 02/09/2019 0317   CL 111 02/09/2019 0317   CO2 19 (L) 02/09/2019 0317   GLUCOSE 73 02/09/2019 0317   BUN 35 (H) 02/09/2019 0317   CREATININE 2.43 (H) 02/09/2019 0317   CALCIUM 7.8 (L) 02/09/2019 0317   CALCIUM 8.5 09/30/2010 0437   PROT 4.3 (L) 02/09/2019 0317   PROT 5.4 (L) 11/02/2017 1147   ALBUMIN 1.9 (L) 02/09/2019 0317   AST 16 02/09/2019 0317   ALT 10 02/09/2019 0317   ALKPHOS 57 02/09/2019 0317   BILITOT 1.3 (H) 02/09/2019 0317   GFRNONAA 28 (L) 02/09/2019 0317   GFRAA 33 (L) 02/09/2019 0317   Lipase  Component Value Date/Time   LIPASE 26 12/14/2018 0125    Studies/Results: Ct Abdomen Pelvis W Contrast  Result Date: 02/08/2019 CLINICAL DATA:  Abdominal pain and possible rectal bleeding. EXAM: CT ABDOMEN AND PELVIS WITH CONTRAST TECHNIQUE: Multidetector CT imaging of the abdomen and pelvis was performed using the standard protocol following bolus administration of intravenous contrast. 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 Mild scattered subsegmental atelectasis. No other acute abnormalities in the lower chest. Hepatobiliary: The shrunken nodular liver is consistent with cirrhosis, unchanged. No focal mass. The portal  vein remains patent. Cholelithiasis is identified. Pancreas: Pancreas is normal. Spleen: Splenomegaly is again identified. Adrenals/Urinary Tract: Adrenal glands are normal. Renal cysts are again identified. No hydronephrosis or perinephric stranding. No ureterectasis or ureteral stones. Bladder is unremarkable. Stomach/Bowel: The stomach and small bowel are normal. Small bowel loops herniated into a periumbilical hernia with no evidence of obstruction. Wall thickening associated with the cecum and proximal ascending colon is not excluded. The appearance could be due to surrounding ascites and lack of distention. The remainder of the colon is normal. The appendix is not seen but there is no secondary evidence of appendicitis. Vascular/Lymphatic: Atherosclerotic changes are seen in the nonaneurysmal aorta. Varicose sees are again seen throughout the upper abdomen consistent with known cirrhosis. No retroperitoneal adenopathy. Prominent nodes in the mesentery are likely due to cirrhosis and not significantly changed. No other evidence of adenopathy. Reproductive: Prostate is unremarkable. Other: Severe ascites throughout the abdomen is severely worsened in the interval. Free air is identified. No source is noted. Musculoskeletal: Chronic changes to the right hip are identified consistent with previous trauma. A compression fracture of L1 is stable and nonacute. No other acute bony abnormalities. IMPRESSION: 1. There is free air in the abdomen. A definitive source is not identified. 2. Cirrhosis with numerous varices, severe ascites, and splenomegaly. 3. Cholelithiasis. 4. Small bowel loops are herniated into a periumbilical hernia with no evidence of obstruction or wall thickening. 5. The wall of the cecum and ascending colon appears somewhat prominent. The finding could be due to surrounding ascites and lack of distension. The finding could also be secondary to the patient's known cirrhosis and portal venous  hypertension. 6. Atherosclerosis in the aorta. Prominent mesenteric nodes, likely secondary to cirrhosis. The findings were called to Dr. Rubin Payor. Electronically Signed   By: Gerome Sam III M.D   On: 02/08/2019 15:09   Dg Chest Port 1 View  Result Date: 02/09/2019 CLINICAL DATA:  Central venous catheter placement. EXAM: PORTABLE CHEST 1 VIEW COMPARISON:  Chest x-ray 01/08/2019 FINDINGS: The right IJ catheter tip appears to be in good position approximately 4 cm below the carina. The patient is significantly rotated to the left but I do not see any evidence of a pneumothorax or hematoma. IMPRESSION: Right IJ central venous catheter in good position without complicating features. Patient significantly rotated to the left making it difficult to assess the left lower lobe. Electronically Signed   By: Rudie Meyer M.D.   On: 02/09/2019 18:48   [place] Ekg Site Rite  Result Date: 02/09/2019 If Site Rite image not attached, placement could not be confirmed due to current cardiac rhythm.    Jerre Simon, PA-C [facility] Please see amion for pager for the following: Venita Lick, W, & Friday 7:00am - 4:30pm Thursdays 7:00am -11:30am

## 2019-02-10 NOTE — Progress Notes (Signed)
Subjective: Patient requesting pain medication.  Objective: Vital signs in last 24 hours: Temp:  [97.6 F (36.4 C)-98.4 F (36.9 C)] 98.1 F (36.7 C) (11/14 1550) Pulse Rate:  [58-79] 69 (11/14 1400) Resp:  [15-30] 23 (11/14 1400) BP: (85-129)/(44-86) 114/79 (11/14 1400) SpO2:  [89 %-97 %] 94 % (11/14 1400) Last BM Date: 02/10/19  Intake/Output from previous day: 11/13 0701 - 11/14 0700 In: 6069.5 [I.V.:3236.5; Blood:1308; IV Piggyback:1525] Out: 1545 [Urine:1245; Stool:300] Intake/Output this shift: Total I/O In: 1241.6 [I.V.:1119.9; IV Piggyback:121.6] Out: -   General appearance: alert and perseverating on his request for pain medication GI: soft, non-tender; bowel sounds normal; no masses,  no organomegaly  Lab Results: Recent Labs    02/09/19 0317 02/09/19 1611 02/10/19 1111  WBC 7.7 5.0 2.9*  HGB 8.3* 7.2* 7.4*  HCT 26.4* 23.3* 22.7*  PLT 113* 71*  69* 68*   BMET Recent Labs    02/08/19 1149 02/09/19 0317 02/10/19 1111  NA 140 139 140  K 4.0 4.5 3.9  CL 112* 111 108  CO2 19* 19* 20*  GLUCOSE 91 73 114*  BUN 26* 35* 43*  CREATININE 1.98* 2.43* 2.34*  CALCIUM 7.9* 7.8* 8.5*   LFT Recent Labs    02/10/19 1111  PROT 5.2*  ALBUMIN 3.1*  AST 15  ALT 10  ALKPHOS 37*  BILITOT 2.0*   PT/INR Recent Labs    02/09/19 1611 02/10/19 1111  LABPROT 24.7* 22.1*  INR 2.3* 2.0*   Hepatitis Panel No results for input(s): HEPBSAG, HCVAB, HEPAIGM, HEPBIGM in the last 72 hours. C-Diff No results for input(s): CDIFFTOX in the last 72 hours. Fecal Lactopherrin No results for input(s): FECLLACTOFRN in the last 72 hours.  Studies/Results: Dg Chest Port 1 View  Result Date: 02/09/2019 CLINICAL DATA:  Central venous catheter placement. EXAM: PORTABLE CHEST 1 VIEW COMPARISON:  Chest x-ray 01/08/2019 FINDINGS: The right IJ catheter tip appears to be in good position approximately 4 cm below the carina. The patient is significantly rotated to the left but I  do not see any evidence of a pneumothorax or hematoma. IMPRESSION: Right IJ central venous catheter in good position without complicating features. Patient significantly rotated to the left making it difficult to assess the left lower lobe. Electronically Signed   By: Rudie Meyer M.D.   On: 02/09/2019 18:48   [place] Ekg Site Rite  Result Date: 02/09/2019 If Site Rite image not attached, placement could not be confirmed due to current cardiac rhythm.   Medications:  Scheduled: . Chlorhexidine Gluconate Cloth  6 each Topical Daily  . [START ON 02/11/2019] hydrocortisone sod succinate (SOLU-CORTEF) inj  50 mg Intravenous Daily  . insulin aspart  0-9 Units Subcutaneous Q4H  . lactulose  30 g Oral TID  . midodrine  10 mg Oral TID WC   Continuous: . sodium chloride    . sodium chloride Stopped (02/09/19 1743)  . albumin human 60 mL/hr at 02/10/19 1400  . lactated ringers 50 mL/hr at 02/10/19 1400  . norepinephrine (LEVOPHED) Adult infusion Stopped (02/10/19 1227)  . octreotide  (SANDOSTATIN)    IV infusion 50 mcg/hr (02/10/19 1400)  . pantoprozole (PROTONIX) infusion 8 mg/hr (02/10/19 1400)  . phytonadione (VITAMIN K) IV    . piperacillin-tazobactam (ZOSYN)  IV 3.375 g (02/10/19 1554)    Assessment/Plan: 1) Bowel perforation, suspect from gastric ulcer. 2) Anemia. 3) Hematochezia. 4) Cirrhosis without intraluminal esophageal varices.   The patient's HGB is stable.  He is not deemed a surgical  candidate.  His vital signs are stable.  The plan is for an upper GI series tomorrow.  Plan: 1) Follow HGB. 2) Transfuse as necessary. 3) Await upper GI series tomorrow.  LOS: 2 days   Zykeria Laguardia D 02/10/2019, 5:25 PM

## 2019-02-10 NOTE — Progress Notes (Signed)
[place] Progress Note Patient Name: [name] DOB: 1960/08/30 MRN: [id number]   Date of Service  02/10/2019  HPI/Events of Note  Severe pain, RN says q2h frequency has not helped  eICU Interventions  Change fentanyl pushes to q1h as needed Patient fully awake and alert      Intervention Category Intermediate Interventions: Pain - evaluation and management  Margaretmary Lombard 02/10/2019, 9:36 PM

## 2019-02-10 NOTE — Progress Notes (Signed)
[place] Progress Note Patient Name: [name] DOB: December 20, 1960 MRN: [id number]   Date of Service  02/10/2019  HPI/Events of Note  Pt insistent on having Artane with his haldol to prevent development of extra-pyramidal symptoms, Artane was verified as a home medication with his dispensing pharmacy.  eICU Interventions  Artane elixir 2 mg po x 1 tonight pending clarification of his PO intake status in a.m.        Kerry Kass Melony Tenpas 02/10/2019, 12:20 AM

## 2019-02-11 DIAGNOSIS — K7031 Alcoholic cirrhosis of liver with ascites: Secondary | ICD-10-CM | POA: Diagnosis not present

## 2019-02-11 DIAGNOSIS — K625 Hemorrhage of anus and rectum: Secondary | ICD-10-CM | POA: Diagnosis not present

## 2019-02-11 DIAGNOSIS — Z515 Encounter for palliative care: Secondary | ICD-10-CM

## 2019-02-11 DIAGNOSIS — R1010 Upper abdominal pain, unspecified: Secondary | ICD-10-CM

## 2019-02-11 DIAGNOSIS — Z7189 Other specified counseling: Secondary | ICD-10-CM

## 2019-02-11 DIAGNOSIS — K631 Perforation of intestine (nontraumatic): Secondary | ICD-10-CM | POA: Diagnosis not present

## 2019-02-11 LAB — BPAM FFP
Blood Product Expiration Date: 202011172359
Blood Product Expiration Date: 202011172359
ISSUE DATE / TIME: 202011140208
ISSUE DATE / TIME: 202011140416
Unit Type and Rh: 6200
Unit Type and Rh: 6200

## 2019-02-11 LAB — COMPREHENSIVE METABOLIC PANEL
ALT: 9 U/L (ref 0–44)
AST: 15 U/L (ref 15–41)
Albumin: 3.8 g/dL (ref 3.5–5.0)
Alkaline Phosphatase: 31 U/L — ABNORMAL LOW (ref 38–126)
Anion gap: 13 (ref 5–15)
BUN: 48 mg/dL — ABNORMAL HIGH (ref 6–20)
CO2: 19 mmol/L — ABNORMAL LOW (ref 22–32)
Calcium: 8.9 mg/dL (ref 8.9–10.3)
Chloride: 110 mmol/L (ref 98–111)
Creatinine, Ser: 2.37 mg/dL — ABNORMAL HIGH (ref 0.61–1.24)
GFR calc Af Amer: 34 mL/min — ABNORMAL LOW (ref >60)
GFR calc non Af Amer: 29 mL/min — ABNORMAL LOW (ref >60)
Glucose, Bld: 83 mg/dL (ref 70–99)
Potassium: 3.6 mmol/L (ref 3.5–5.1)
Sodium: 142 mmol/L (ref 135–145)
Total Bilirubin: 1.6 mg/dL — ABNORMAL HIGH (ref 0.3–1.2)
Total Protein: 5.5 g/dL — ABNORMAL LOW (ref 6.5–8.1)

## 2019-02-11 LAB — CBC
HCT: 23.6 % — ABNORMAL LOW (ref 39.0–52.0)
Hemoglobin: 7.6 g/dL — ABNORMAL LOW (ref 13.0–17.0)
MCH: 29.8 pg (ref 26.0–34.0)
MCHC: 32.2 g/dL (ref 30.0–36.0)
MCV: 92.5 fL (ref 80.0–100.0)
Platelets: 81 10*3/uL — ABNORMAL LOW (ref 150–400)
RBC: 2.55 MIL/uL — ABNORMAL LOW (ref 4.22–5.81)
RDW: 18.8 % — ABNORMAL HIGH (ref 11.5–15.5)
WBC: 3.9 10*3/uL — ABNORMAL LOW (ref 4.0–10.5)
nRBC: 0 % (ref 0.0–0.2)

## 2019-02-11 LAB — PREPARE FRESH FROZEN PLASMA
Unit division: 0
Unit division: 0

## 2019-02-11 LAB — GLUCOSE, CAPILLARY
Glucose-Capillary: 87 mg/dL (ref 70–99)
Glucose-Capillary: 82 mg/dL (ref 70–99)
Glucose-Capillary: 90 mg/dL (ref 70–99)
Glucose-Capillary: 94 mg/dL (ref 70–99)
Glucose-Capillary: 101 mg/dL — ABNORMAL HIGH (ref 70–99)

## 2019-02-11 MED ORDER — HALOPERIDOL 1 MG PO TABS
2.0000 mg | ORAL_TABLET | Freq: Four times a day (QID) | ORAL | Status: DC | PRN
  Administered 2019-02-11 – 2019-02-20 (×12): 2 mg via ORAL
  Filled 2019-02-11 (×17): qty 2

## 2019-02-11 MED ORDER — TRIHEXYPHENIDYL HCL 2 MG PO TABS
2.0000 mg | ORAL_TABLET | Freq: Once | ORAL | Status: AC
  Administered 2019-02-11: 2 mg via ORAL
  Filled 2019-02-11: qty 1

## 2019-02-11 MED ORDER — ALBUMIN HUMAN 25 % IV SOLN
25.0000 g | Freq: Four times a day (QID) | INTRAVENOUS | Status: AC
  Administered 2019-02-11 – 2019-02-12 (×4): 25 g via INTRAVENOUS
  Filled 2019-02-11 (×2): qty 100
  Filled 2019-02-11: qty 50
  Filled 2019-02-11: qty 100

## 2019-02-11 MED ORDER — HALOPERIDOL LACTATE 5 MG/ML IJ SOLN
1.0000 mg | Freq: Four times a day (QID) | INTRAMUSCULAR | Status: DC | PRN
  Administered 2019-02-13 – 2019-02-14 (×4): 1 mg via INTRAMUSCULAR
  Filled 2019-02-11 (×5): qty 1

## 2019-02-11 NOTE — Progress Notes (Signed)
Subjective: No acute events.  Objective: Vital signs in last 24 hours: Temp:  [97.8 F (36.6 C)-98.2 F (36.8 C)] 98.2 F (36.8 C) (11/15 0400) Pulse Rate:  [57-75] 64 (11/15 0600) Resp:  [17-23] 17 (11/15 0600) BP: (102-132)/(56-79) 130/62 (11/15 0600) SpO2:  [89 %-94 %] 89 % (11/15 0600) Weight:  [109.9 kg] 109.9 kg (11/15 0501) Last BM Date: 02/10/19  Intake/Output from previous day: 11/14 0701 - 11/15 0700 In: 2111.6 [I.V.:1476.7; IV Piggyback:634.9] Out: 1150 [Urine:1150] Intake/Output this shift: No intake/output data recorded.  General appearance: soundly sleeping GI: soft, non-tender; bowel sounds normal; no masses,  no organomegaly  Lab Results: Recent Labs    02/09/19 1611 02/10/19 1111 02/11/19 0345  WBC 5.0 2.9* 3.9*  HGB 7.2* 7.4* 7.6*  HCT 23.3* 22.7* 23.6*  PLT 71*  69* 68* 81*   BMET Recent Labs    02/09/19 0317 02/10/19 1111 02/11/19 0345  NA 139 140 142  K 4.5 3.9 3.6  CL 111 108 110  CO2 19* 20* 19*  GLUCOSE 73 114* 83  BUN 35* 43* 48*  CREATININE 2.43* 2.34* 2.37*  CALCIUM 7.8* 8.5* 8.9   LFT Recent Labs    02/11/19 0345  PROT 5.5*  ALBUMIN 3.8  AST 15  ALT 9  ALKPHOS 31*  BILITOT 1.6*   PT/INR Recent Labs    02/09/19 1611 02/10/19 1111  LABPROT 24.7* 22.1*  INR 2.3* 2.0*   Hepatitis Panel No results for input(s): HEPBSAG, HCVAB, HEPAIGM, HEPBIGM in the last 72 hours. C-Diff No results for input(s): CDIFFTOX in the last 72 hours. Fecal Lactopherrin No results for input(s): FECLLACTOFRN in the last 72 hours.  Studies/Results: US Renal  Result Date: 02/10/2019 CLINICAL DATA:  Acute kidney injury EXAM: RENAL / URINARY TRACT ULTRASOUND COMPLETE COMPARISON:  CT 02/08/2019 FINDINGS: Right Kidney: Renal measurements: 11.3 x 5.4 x 6.1 cm = volume: 194 mL. Multiple small cysts, the largest 3.2 cm in the upper pole. Mildly increased echotexture and cortical thinning. No hydronephrosis. Left Kidney: Renal measurements: 10.6 x  5.2 x 5.7 cm = volume: 162 mL. Small cysts, the largest 1.8 cm in the upper pole. Mildly increased echotexture and cortical thinning. No hydronephrosis. Bladder: Foley catheter in place, decompressed. Other: Moderate ascites noted. IMPRESSION: Mildly increased echotexture with cortical thinning compatible with chronic medical renal disease. No hydronephrosis. Moderate ascites. Electronically Signed   By: Charlett Nose M.D.   On: 02/10/2019 21:46   Dg Chest Port 1 View  Result Date: 02/09/2019 CLINICAL DATA:  Central venous catheter placement. EXAM: PORTABLE CHEST 1 VIEW COMPARISON:  Chest x-ray 01/08/2019 FINDINGS: The right IJ catheter tip appears to be in good position approximately 4 cm below the carina. The patient is significantly rotated to the left but I do not see any evidence of a pneumothorax or hematoma. IMPRESSION: Right IJ central venous catheter in good position without complicating features. Patient significantly rotated to the left making it difficult to assess the left lower lobe. Electronically Signed   By: Rudie Meyer M.D.   On: 02/09/2019 18:48   [place] Ekg Site Rite  Result Date: 02/09/2019 If Site Rite image not attached, placement could not be confirmed due to current cardiac rhythm.   Medications:  Scheduled: . Chlorhexidine Gluconate Cloth  6 each Topical Daily  . hydrocortisone sod succinate (SOLU-CORTEF) inj  50 mg Intravenous Daily  . insulin aspart  0-9 Units Subcutaneous Q4H  . lactulose  30 g Oral TID  . midodrine  10 mg  Oral TID WC  . pantoprazole (PROTONIX) IV  40 mg Intravenous Q12H   Continuous: . sodium chloride    . sodium chloride Stopped (02/09/19 1743)  . phytonadione (VITAMIN K) IV    . piperacillin-tazobactam (ZOSYN)  IV Stopped (02/11/19 0548)    Assessment/Plan: 1) Perforated gastric ulcer. 2) Bleeding from gastric ulcer. 3) Cirrhosis. 4) Anemia.   His HGB is stable.  Nursing does not report any further maroon stools in his rectal bag.  The  patient's pain control was increased to q1 hour PRN.  He remains on Zosyn.  Plan: 1) Continue with the current supportive care.  LOS: 3 days   [name] D 02/11/2019, 8:00 AM

## 2019-02-11 NOTE — Plan of Care (Signed)
[facility] to pick up on 11/16 from PCCM.  See [facility] communication for details.

## 2019-02-11 NOTE — Consult Note (Signed)
Palliative Medicine   Name: [name] Date: 02/11/2019 MRN: [id number]  DOB: 03/12/1961  Patient Care Team: Fleet ContrasAvbuere, Edwin, MD as PCP - General (Internal Medicine)    REASON FOR CONSULTATION: Palliative Care consult requested for this 58 y.o. 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  CT of abdomen and pelvis revealed pneumoperitoneum from bowel perforation.  Surgery was consulted but patient was not felt to be a surgical candidate due to the high risk from comorbidities.  He has been treated conservatively.  Palliative care was consulted to help address goals.  SOCIAL HISTORY:     reports that he has been smoking cigarettes. He has a 25.00 pack-year smoking history. He has never used smokeless tobacco. He reports that he does not drink alcohol or use drugs.   Patient is not married.  He has no children.  He has several friends who are involved in his care.  Patient is a [place] refugee and immigrated in the 4990s.  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  ADVANCE DIRECTIVES:  Not on file  CODE STATUS: Full code  PAST MEDICAL HISTORY: Past Medical History:  Diagnosis Date   Anemia    Ascites    Chronic leg pain    Cirrhosis (HCC)    GERD (gastroesophageal reflux disease)    H/O diabetes insipidus    Hepatitis C    Completed therapy with Epclusa ~ 2018.  suspect therapy over[facility] [place]   History of adrenal insufficiency    History of ARDS    History of blood transfusion    Hypothyroidism    Kidney disease    stage 3   Neuropathy    Overdose 12/2009   Peripheral neuropathy    Personality disorder (HCC)    PTSD (post-traumatic stress disorder)    [place]    Schizoaffective disorder (HCC)     PAST SURGICAL HISTORY:  Past Surgical History:  Procedure Laterality Date   BIOPSY  01/10/2019   Procedure: BIOPSY;  Surgeon: Beverley FiedlerPyrtle, Jay M, MD;  Location: [facility];  Service: Endoscopy;;   CERVICAL SPINE SURGERY  unknown   ESOPHAGOGASTRODUODENOSCOPY (EGD) WITH PROPOFOL N/A 01/10/2019   Procedure: ESOPHAGOGASTRODUODENOSCOPY (EGD) WITH PROPOFOL;  Surgeon: Beverley FiedlerPyrtle, Jay M, MD;  Location: [facility];  Service: Endoscopy;  Laterality: N/A;   RIGHT HIP SURGERY  1996   US GUIDED LEFT THORACENTESIS  01/21/2010    HEMATOLOGY/ONCOLOGY HISTORY:  Oncology History   No history exists.    ALLERGIES:  has No Known Allergies.  MEDICATIONS:  Current Facility-Administered Medications  Medication Dose Route Frequency Provider Last Rate Last Dose   0.9 %  sodium chloride infusion   Intra-arterial PRN Sherryll BurgerShah, Pratik D, DO       0.9 %  sodium chloride infusion  250 mL Intravenous Continuous Oretha MilchAlva, Rakesh V, MD   Stopped at 02/09/19 1743   albumin human 25 % solution 25 g  25 g Intravenous Q6H Icard, Bradley L, DO 60 mL/hr at 02/11/19 1551 25 g at 02/11/19 1551   Chlorhexidine Gluconate Cloth 2 % PADS 6 each  6 each Topical Daily Oretha MilchAlva, Rakesh V, MD   6 each at 02/11/19 0541   fentaNYL (SUBLIMAZE) injection 25-50 mcg  25-50 mcg Intravenous Q1H PRN Kamat, Sunil G,  MD   50 mcg at 02/11/19 1549   haloperidol (HALDOL) tablet 2 mg  2 mg Oral Q6H PRN Icard, Bradley L, DO       Or   haloperidol lactate (HALDOL) injection 1 mg  1 mg Intramuscular Q6H PRN Icard, Bradley L, DO       haloperidol lactate (HALDOL) injection 0.5 mg  0.5 mg Intravenous BID BM & HS PRN Migdalia Dk, MD   0.5 mg at 02/11/19 0931   hydrocortisone sodium succinate (SOLU-CORTEF) 100 MG injection 50 mg  50 mg Intravenous Daily Icard, Bradley L, DO   50 mg at 02/11/19 0930   insulin aspart (novoLOG) injection 0-9 Units  0-9 Units Subcutaneous Q4H Shah, Pratik D, DO       lactulose  (CHRONULAC) 10 GM/15ML solution 30 g  30 g Oral TID Dorthea Cove, NP   30 g at 02/10/19 2147   midodrine (PROAMATINE) tablet 10 mg  10 mg Oral TID WC Sherryll Burger, Pratik D, DO   Stopped at 02/11/19 0821   ondansetron (ZOFRAN) injection 4 mg  4 mg Intravenous Q6H PRN Pahwani, Rinka R, MD       pantoprazole (PROTONIX) injection 40 mg  40 mg Intravenous Q12H Dorthea Cove, NP   40 mg at 02/11/19 9323   phytonadione (VITAMIN K) 10 mg in dextrose 5 % 50 mL IVPB  10 mg Intravenous Once Dorthea Cove, NP       piperacillin-tazobactam (ZOSYN) IVPB 3.375 g  3.375 g Intravenous Q8H Vicente Serene, RPH 12.5 mL/hr at 02/11/19 1027 3.375 g at 02/11/19 1027    VITAL SIGNS: BP 138/74    Pulse 63    Temp 98.1 F (36.7 C) (Oral)    Resp 19    Ht 6\' 6"  (1.981 m)    Wt 242 lb 4.6 oz (109.9 kg)    SpO2 93%    BMI 28.00 kg/m  Filed Weights   02/09/19 1534 02/11/19 0500 02/11/19 0501  Weight: 236 lb 12.4 oz (107.4 kg) 242 lb 4.6 oz (109.9 kg) 242 lb 4.6 oz (109.9 kg)    Estimated body mass index is 28 kg/m as calculated from the following:   Height as of this encounter: 6\' 6"  (1.981 m).   Weight as of this encounter: 242 lb 4.6 oz (109.9 kg).  LABS: CBC:    Component Value Date/Time   WBC 3.9 (L) 02/11/2019 0345   HGB 7.6 (L) 02/11/2019 0345   HCT 23.6 (L) 02/11/2019 0345   PLT 81 (L) 02/11/2019 0345   MCV 92.5 02/11/2019 0345   NEUTROABS 4.1 02/08/2019 1149   LYMPHSABS 1.2 02/08/2019 1149   MONOABS 0.3 02/08/2019 1149   EOSABS 0.4 02/08/2019 1149   BASOSABS 0.1 02/08/2019 1149   Comprehensive Metabolic Panel:    Component Value Date/Time   NA 142 02/11/2019 0345   K 3.6 02/11/2019 0345   CL 110 02/11/2019 0345   CO2 19 (L) 02/11/2019 0345   BUN 48 (H) 02/11/2019 0345   CREATININE 2.37 (H) 02/11/2019 0345   GLUCOSE 83 02/11/2019 0345   CALCIUM 8.9 02/11/2019 0345   CALCIUM 8.5 09/30/2010 0437   AST 15 02/11/2019 0345   ALT 9 02/11/2019 0345   ALKPHOS 31 (L) 02/11/2019 0345    BILITOT 1.6 (H) 02/11/2019 0345   PROT 5.5 (L) 02/11/2019 0345   PROT 5.4 (L) 11/02/2017 1147   ALBUMIN 3.8 02/11/2019 0345    RADIOGRAPHIC STUDIES: Ct Abdomen Pelvis W Contrast  Result Date: 02/08/2019 CLINICAL DATA:  Abdominal pain and possible rectal bleeding. EXAM: CT ABDOMEN AND PELVIS WITH CONTRAST TECHNIQUE: Multidetector CT imaging of the abdomen and pelvis was performed using the standard protocol following bolus administration of intravenous contrast. CONTRAST:  151mL OMNIPAQUE IOHEXOL 300 MG/ML  SOLN COMPARISON:  Aug 07, 2018 FINDINGS: Lower chest: Paraesophageal varices are again identified. Mild scattered subsegmental atelectasis. No other acute abnormalities in the lower chest. Hepatobiliary: The shrunken nodular liver is consistent with cirrhosis, unchanged. No focal mass. The portal vein remains patent. Cholelithiasis is identified. Pancreas: Pancreas is normal. Spleen: Splenomegaly is again identified. Adrenals/Urinary Tract: Adrenal glands are normal. Renal cysts are again identified. No hydronephrosis or perinephric stranding. No ureterectasis or ureteral stones. Bladder is unremarkable. Stomach/Bowel: The stomach and small bowel are normal. Small bowel loops herniated into a periumbilical hernia with no evidence of obstruction. Wall thickening associated with the cecum and proximal ascending colon is not excluded. The appearance could be due to surrounding ascites and lack of distention. The remainder of the colon is normal. The appendix is not seen but there is no secondary evidence of appendicitis. Vascular/Lymphatic: Atherosclerotic changes are seen in the nonaneurysmal aorta. Varicose sees are again seen throughout the upper abdomen consistent with known cirrhosis. No retroperitoneal adenopathy. Prominent nodes in the mesentery are likely due to cirrhosis and not significantly changed. No other evidence of adenopathy. Reproductive: Prostate is unremarkable. Other: Severe ascites  throughout the abdomen is severely worsened in the interval. Free air is identified. No source is noted. Musculoskeletal: Chronic changes to the right hip are identified consistent with previous trauma. A compression fracture of L1 is stable and nonacute. No other acute bony abnormalities. IMPRESSION: 1. There is free air in the abdomen. A definitive source is not identified. 2. Cirrhosis with numerous varices, severe ascites, and splenomegaly. 3. Cholelithiasis. 4. Small bowel loops are herniated into a periumbilical hernia with no evidence of obstruction or wall thickening. 5. The wall of the cecum and ascending colon appears somewhat prominent. The finding could be due to surrounding ascites and lack of distension. The finding could also be secondary to the patient's known cirrhosis and portal venous hypertension. 6. Atherosclerosis in the aorta. Prominent mesenteric nodes, likely secondary to cirrhosis. The findings were called to Dr. Alvino Chapel. Electronically Signed   By: Dorise Bullion III M.D   On: 02/08/2019 15:09   US Renal  Result Date: 02/10/2019 CLINICAL DATA:  Acute kidney injury EXAM: RENAL / URINARY TRACT ULTRASOUND COMPLETE COMPARISON:  CT 02/08/2019 FINDINGS: Right Kidney: Renal measurements: 11.3 x 5.4 x 6.1 cm = volume: 194 mL. Multiple small cysts, the largest 3.2 cm in the upper pole. Mildly increased echotexture and cortical thinning. No hydronephrosis. Left Kidney: Renal measurements: 10.6 x 5.2 x 5.7 cm = volume: 162 mL. Small cysts, the largest 1.8 cm in the upper pole. Mildly increased echotexture and cortical thinning. No hydronephrosis. Bladder: Foley catheter in place, decompressed. Other: Moderate ascites noted. IMPRESSION: Mildly increased echotexture with cortical thinning compatible with chronic medical renal disease. No hydronephrosis. Moderate ascites. Electronically Signed   By: Rolm Baptise M.D.   On: 02/10/2019 21:46   Dg Chest Port 1 View  Result Date:  02/09/2019 CLINICAL DATA:  Central venous catheter placement. EXAM: PORTABLE CHEST 1 VIEW COMPARISON:  Chest x-ray 01/08/2019 FINDINGS: The right IJ catheter tip appears to be in good position approximately 4 cm below the carina. The patient is significantly rotated to the left but I do not see any  evidence of a pneumothorax or hematoma. IMPRESSION: Right IJ central venous catheter in good position without complicating features. Patient significantly rotated to the left making it difficult to assess the left lower lobe. Electronically Signed   By: Rudie Meyer M.D.   On: 02/09/2019 18:48   [place] Ekg Site Rite  Result Date: 02/09/2019 If Site Rite image not attached, placement could not be confirmed due to current cardiac rhythm.   PERFORMANCE STATUS (ECOG) : 2 - Symptomatic, <50% confined to bed  Review of Systems Unable to complete  Physical Exam General: NAD, frail appearing, thin Cardiovascular: regular rate and rhythm Pulmonary: Unlabored Extremities: no edema, no joint deformities Skin: no rashes Neurological: Weakness, some confusion  IMPRESSION: I attempted to meet with patient to discuss goals.  I had difficulty communicating with him due to the language barrier and/or confusion.  I would ask him a question and get an inappropriate response.  He was perseverating on telling me wartime stories and discussing ultimate fighting.   I tried calling his friend, Emi Holes, who is listed as his emergency contact but was unsuccessful at reaching her.  Symptomatically, patient has persistent abdominal pain, partially improved with use with IV fentanyl.   Patient is pending upper GI study tomorrow.  Will follow up and try to readdress goals.   PLAN: -Continue current scope of treatment -Will follow and continue to try to address goals -Will ask that RN notify us if friends arrive to the hospital    Time Total: 30 minutes  The following visit consisted of counseling and education  dealing with the complex and emotionally intense issues of symptom management and palliative care in the setting of serious and potentially life-threatening illness.Greater than 50%  of this time was spent counseling and coordinating care related to the above assessment and plan.  Signed by: Laurette Schimke, PhD, NP-C

## 2019-02-11 NOTE — Progress Notes (Signed)
[facility] Progress Note  Subjective: no new c/o, BP's stable, creat unchanged, 2.1 L in and 1.1 L uop. UA neg and renal US no hydro. UNa low   Vitals:   02/11/19 0400 02/11/19 0500 02/11/19 0501 02/11/19 0600  BP: 126/69 127/71  130/62  Pulse: 68 64  64  Resp: _0 Temp: 98.2 F (36.8 C)     TempSrc: Oral     SpO2: 91% 92%  (!) 89%  Weight:  109.9 kg 109.9 kg   Height:        Inpatient medications: . Chlorhexidine Gluconate Cloth  6 each Topical Daily  . hydrocortisone sod succinate (SOLU-CORTEF) inj  50 mg Intravenous Daily  . insulin aspart  0-9 Units Subcutaneous Q4H  . lactulose  30 g Oral TID  . midodrine  10 mg Oral TID WC  . pantoprazole (PROTONIX) IV  40 mg Intravenous Q12H   . sodium chloride    . sodium chloride Stopped (02/09/19 1743)  . phytonadione (VITAMIN K) IV    . piperacillin-tazobactam (ZOSYN)  IV Stopped (02/11/19 0548)   Place/Maintain arterial line **AND** sodium chloride, fentaNYL (SUBLIMAZE) injection, haloperidol lactate, ondansetron (ZOFRAN) IV    Exam: Gen alert, conversant, chronically ill, cachectic flat neck veins Chest clear bilat to bases RRR no MRG Abd distended w/ ++ascites, mildly tender, no mass , +bs GU normal male w/ foley draining clear amber urine Ext 1-2+ UE edema, 1-2+ bilat LE edema Neuro is alert, Ox 3 , nf    Home meds:  - gabapentin 200 tid/ oxycodone 10 tid prn/ haloperidol 41m bid  - lactulose 30gm bid prn to have 2-3 loose bm's per day  - pantoprazole 40 bid  - prednisone 50 qd  - sensipar 30 qd     Date                           Creat                           eGFR   08/2007- 12/2009        0.9- 1.35   12/2009- 2012           1.52- 1.99]   2013- 2017                1.81- 2.43   2019                          1.95- 2.18   07/2018- 11/2018          1.70- 2.07                    34- 43     12/2018                     2.26 >> 1.74   11/12                         1.98                              36           11/13  2.43    11/ 24/20                  2.34                             30          UA 11/14 - negative    UNa <10, Ucr 69    CT abd > Adrenals/Urinary Tract: .Marland Kitchen..renal cysts are again identified. No hydronephrosis or perinephric stranding. No ureterectasis or ureteral stones. Bladder is unremarkable.    CXR 11/13 > poor film, rotated, possible L effusion /density    Renal US 11/14 - 10- 11 cm kidneys, mild ^echo and cortical thinning, no hydro  Assessment/ Plan: 1. AKI on CKD 3b - baseline creat 1.7- 2.0.  Renal US some ckd, UA negative, UNa < 10. AKI due to HRS , less likely contrast. BP's improved w/ HRS protocol, would cont meds as protocol (3d of IV alb/ octreotide, can cont midodrine for longer). Creat stable. Should be put on fluid / Na+ restriction when no longer NPO.   2. Volume - up 7kg since admission, stopped IVF"s. If needs dextrose for hypoglycemia, use D5W.  3. Cirrhosis/ hepatitis C - on lactulose, sig ascites 4. Perforated viscous - by CT scan on admit, seen by gen surg they felt he probably had a sealed perforated ulcer.  Medical management was suggested given his overall poor health.  5. Schizoaffective d/o 6. Hx adrenal insuff - on prednisone     [name] 02/11/2019, 7:19 AM  Iron/TIBC/Ferritin/ %Sat    Component Value Date/Time   IRON 15 (L) 08/07/2018 0800   TIBC 348 08/07/2018 0800   FERRITIN 3 (L) 08/07/2018 0800   IRONPCTSAT 4 (L) 08/07/2018 0800   Recent Labs  Lab 02/08/19 1709  02/10/19 1111 02/11/19 0345  NA  --    < > 140 142  K  --    < > 3.9 3.6  CL  --    < > 108 110  CO2  --    < > 20* 19*  GLUCOSE  --    < > 114* 83  BUN  --    < > 43* 48*  CREATININE  --    < > 2.34* 2.37*  CALCIUM  --    < > 8.5* 8.9  PHOS 3.8  --   --   --   ALBUMIN  --    < > 3.1* 3.8  INR  --    < > 2.0*  --    < > = values in this interval not displayed.   Recent Labs  Lab 02/11/19 0345  AST 15  ALT 9   ALKPHOS 31*  BILITOT 1.6*  PROT 5.5*   Recent Labs  Lab 02/11/19 0345  WBC 3.9*  HGB 7.6*  HCT 23.6*  PLT 81*

## 2019-02-11 NOTE — Progress Notes (Signed)
NAME:  [name], MRN:  [id number], DOB:  1960-05-29, LOS: 3 ADMISSION DATE:  02/08/2019, CONSULTATION DATE:  02/09/2019 REFERRING MD: Manuella Ghazi  CHIEF COMPLAINT: Abd pain, perf ABD, sepsis    Brief History   58 yo male with bowel perforation/pneumoperitoneum non amenable to surgery d/t high risk,  hypotension and sepsis. PCCM consulted for management assistance.   History of present illness   58 y.o. male with medical history significant of cirrhosis with splenomegaly, esophageal varices, hepatitis C, adrenal insufficiency, hypothyroidism, schizoaffective disorder, PTSD, GI bleed, neuropathy, chronic kidney disease presented to emergency department due to worsening abdominal pain, distention associated with rectal bleeding for 1 day.  Work-up revealed bowel perforation/pneumoperitoneum.  Surgery consulted and recommended no operative intervention due to high mortality risk, antibiotics and bowel rest.  GI was also consulted for possible paracentesis due to significant ascites as well as dark maroon stools rectal and acute blood loss anemia.  Protonix infusion was initiated as well as octreotide as patient has history of esophageal varices.  He was admitted to stepdown status.  He remained in ER while awaiting bed.  He continued to have borderline blood pressure, progressing to hypotension.  PCCM consulted for management.  Of note: EGD from 01/10/2019 which showed acute esophagitis and nonbleeding large gastric ulcer in antrum. Has been treated for Hep C in 2019 at [facility].   Past Medical History  has Personality disorder ([place]); Normocytic anemia; Ascites; Liver disease; Hepatitis C; Kidney disease; GERD (gastroesophageal reflux disease); Schizoaffective disorder ([place]); PTSD (post-traumatic stress disorder); Hypothyroidism; History of adrenal insufficiency; H/O diabetes insipidus; Acute upper gastrointestinal bleeding; Generalized abdominal pain; Hematemesis with nausea; Evaluation by psychiatric  service required; GI bleed; Acute abdominal pain; Cirrhosis ([place]); Hematemesis; Prolonged QT interval; Esophageal varices in cirrhosis (HCC); CKD (chronic kidney disease) stage 3, GFR 30-59 ml/min; Suicidal ideation; Closed displaced fracture of neck of left fifth metacarpal bone; Acute blood loss anemia; Acute gastric ulcer; Duodenal ulcer; Acute esophagitis; Abdominal pain; Bowel perforation ([place]); Bleeding per rectum; Septic shock ([place]); Intra-abdominal free air of unknown etiology; and Central venous catheter in place on their problem list.  [facility] Events   11/12 Admitted   Consults:  PCCM, GI, Gen Surg.  Procedures:    Significant Diagnostic Tests:  11/12: CT AP.  Free air in abdomen, definitive source not identified.  Cirrhosis with numerous varices, severe ascites and splenomegaly.  Cholelithiasis.  Small bowel loops are herniated into a periumbilical hernia with no evidence of obstruction.  Prominent wall of cecum and ascending colon.  Atherosclerosis in aorta.  Prominent mesenteric nodes likely secondary to cirrhosis.  Micro Data:  SARS Coronavirus 2 negative 11/12 blood>>  Antimicrobials:  11/12 Pip-Tazo>>  Interim history/subjective:   Patient states he feels good this morning.  "I am fine" denies abdominal pain.  Does have bloody stool/maroon stool in his rectal bag.  Objective   Blood pressure (!) 125/58, pulse 64, temperature 98.2 F (36.8 C), temperature source Axillary, resp. rate 18, height 6\' 6"  (1.981 m), weight 109.9 kg, SpO2 92 %.        Intake/Output Summary (Last 24 hours) at 02/11/2019 0910 Last data filed at 02/11/2019 0800 Gross per 24 hour  Intake 1837.46 ml  Output 1150 ml  Net 687.46 ml   Filed Weights   02/09/19 1534 02/11/19 0500 02/11/19 0501  Weight: 107.4 kg 109.9 kg 109.9 kg    Examination: General: Chronically debilitated male, resting in bed no apparent distress watching television HENT: ncAT, temporalis muscle wasting,  reactive pupils, tracking appropriately Neck: No JVD, pulled out right central IJ yesterday CV: Regular rate and rhythm, S1-S2 no MRG Lungs: Manage breath sounds bilaterally no crackles no wheeze ABD: Mildly distended some tender to palpation in the epigastrium GU: No Foley EXT: Edema bilaterally, dependent upper extremity and lower extremities Neuro: Alert, oriented following commands however he hollers out for the nurse and calls her mom.  But when asked questions he is very aware of where he is and what is going on.   Assessment & Plan:   Septic shock due to bowel perforation / pneumoperitoneum - perf of known gastric and / or duodenal ulcers.   Peritonitis Evaluated by [facility] and deemed to not be a surgical candidate given high mortality. Recent endoscopy with no esophageal varices. Hx adrenal insufficiency (on 50mg  prednisone daily).  I am not sure why -Continue supportive care, bowel rest -All of this guided per GI/surgery recommendations -Has not required any vasopressors in greater than 24 hours -Was given additional doses of albumin yesterday, continue midodrine Octreotide stopped -Continue empiric Zosyn for peritonitis -Upper GI series ordered by surgery/GI.  Cirrhosis with ascites - MELD 23, Child Pugh C. Hx esophageal varices, HCV  (treated with Epclusa in 2018, followed [facility] liver [facility] at [facility]). -His cirrhosis is a significant comorbidity -I have consulted palliative care, we appreciate their input.  Anemia with GIB - Due to known gastric & duodenal ulcers (had EGD Oct 2020 which showed grade D esophagitis, 2cm hiatal hernia, 5cm non-bleeding gastric ulcer and non-bleeding duodenal ulcers). -Transfuse for hemoglobin less than 7 -Continue PPI twice daily  Acute renal failure in the setting of CKD -Fluids stopped by nephrology -We appreciate their input -Continue albumin plus midodrine  Hx depression, PTSD, schizoaffective disorder, chronic pain. -Pain  control with fentanyl -As needed Haldol, scheduled p.o. Haldol once he is able to take p.o. again.   Best practice:  Diet: NPO, planned upper GI series Pain/Anxiety/Delirium protocol (if indicated): fentanyl PRN pain. VAP protocol (if indicated): N/A. DVT prophylaxis: SCD's. GI prophylaxis: PPI gtt. Glucose control: None. Mobility: Bedrest. Code Status: Full. Family Communication: None, he has no family. Disposition: ICU.  Labs   CBC: Recent Labs  Lab 02/08/19 1149 02/09/19 0317 02/09/19 1611 02/10/19 1111 02/11/19 0345  WBC 6.1 7.7 5.0 2.9* 3.9*  NEUTROABS 4.1  --   --   --   --   HGB 8.5* 8.3* 7.2* 7.4* 7.6*  HCT 28.4* 26.4* 23.3* 22.7* 23.6*  MCV 99.6 97.4 97.1 92.7 92.5  PLT 159 113* 71*   69* 68* 81*    Basic Metabolic Panel: Recent Labs  Lab 02/08/19 1149 02/08/19 1709 02/09/19 0317 02/10/19 1111 02/11/19 0345  NA 140  --  139 140 142  K 4.0  --  4.5 3.9 3.6  CL 112*  --  111 108 110  CO2 19*  --  19* 20* 19*  GLUCOSE 91  --  73 114* 83  BUN 26*  --  35* 43* 48*  CREATININE 1.98*  --  2.43* 2.34* 2.37*  CALCIUM 7.9*  --  7.8* 8.5* 8.9  MG  --  1.8  --   --   --   PHOS  --  3.8  --   --   --    GFR: Estimated Creatinine Clearance: 47.5 mL/min (A) (by C-G formula based on SCr of 2.37 mg/dL (H)). Recent Labs  Lab 02/09/19 0317 02/09/19 1405 02/09/19 1611 02/10/19 1111 02/11/19 0345  WBC 7.7  --  5.0 2.9* 3.9*  LATICACIDVEN  --  3.3*  --  1.6  --     Liver Function Tests: Recent Labs  Lab 02/08/19 1149 02/09/19 0317 02/10/19 1111 02/11/19 0345  AST 15 16 15 15   ALT 8 10 10 9   ALKPHOS 59 57 37* 31*  BILITOT 0.9 1.3* 2.0* 1.6*  PROT 4.7* 4.3* 5.2* 5.5*  ALBUMIN 2.2* 1.9* 3.1* 3.8   No results for input(s): LIPASE, AMYLASE in the last 168 hours. Recent Labs  Lab 02/08/19 1709 02/10/19 1111  AMMONIA 72* 32    ABG    Component Value Date/Time   PHART 7.424 02/04/2010 0840   PCO2ART 52.1 (H) 02/04/2010 0840   PO2ART 60.1 (L)  02/04/2010 0840   HCO3 33.5 (H) 02/04/2010 0840   TCO2 21 11/17/2010 2014   ACIDBASEDEF 0.7 01/20/2010 1032   O2SAT 90.5 02/04/2010 0840     Coagulation Profile: Recent Labs  Lab 02/08/19 1522 02/09/19 0317 02/09/19 1611 02/10/19 1111  INR 1.6* 1.8* 2.3* 2.0*    Cardiac Enzymes: No results for input(s): CKTOTAL, CKMB, CKMBINDEX, TROPONINI in the last 168 hours.  HbA1C: Hgb A1c MFr Bld  Date/Time Value Ref Range Status  02/09/2019 02:19 PM 3.9 (L) 4.8 - 5.6 % Final    Comment:    (NOTE) Pre diabetes:          5.7%-6.4% Diabetes:              >6.4% Glycemic control for   <7.0% adults with diabetes   11/02/2017 11:47 AM 4.4 (L) 4.8 - 5.6 % Final    Comment:             Prediabetes: 5.7 - 6.4          Diabetes: >6.4          Glycemic control for adults with diabetes: <7.0     CBG: Recent Labs  Lab 02/10/19 1558 02/10/19 1954 02/10/19 2312 02/11/19 0344 02/11/19 0825  GLUCAP 94 91 92 87 82    Past Medical History  He,  has a past medical history of Anemia, Ascites, Chronic leg pain, Cirrhosis (HCC), GERD (gastroesophageal reflux disease), H/O diabetes insipidus, Hepatitis C, History of adrenal insufficiency, History of ARDS, History of blood transfusion, Hypothyroidism, Kidney disease, Neuropathy, Overdose (12/2009), Peripheral neuropathy, Personality disorder (HCC), PTSD (post-traumatic stress disorder), and Schizoaffective disorder (HCC).   Surgical History    Past Surgical History:  Procedure Laterality Date   BIOPSY  01/10/2019   Procedure: BIOPSY;  Surgeon: 01/2010, MD;  Location: [facility] ENDOSCOPY;  Service: Endoscopy;;   CERVICAL SPINE SURGERY  unknown   ESOPHAGOGASTRODUODENOSCOPY (EGD) WITH PROPOFOL N/A 01/10/2019   Procedure: ESOPHAGOGASTRODUODENOSCOPY (EGD) WITH PROPOFOL;  Surgeon: Beverley Fiedler, MD;  Location: [facility] ENDOSCOPY;  Service: Endoscopy;  Laterality: N/A;   RIGHT HIP SURGERY  1996   01/12/2019 GUIDED LEFT THORACENTESIS  01/21/2010      Social History   reports that he has been smoking cigarettes. He has a 25.00 pack-year smoking history. He has never used smokeless tobacco. He reports that he does not drink alcohol or use drugs.   Family History   His family history includes Cancer in his mother; Heart attack in his father.   Allergies No Known Allergies   Home Medications  Prior to Admission medications   Medication Sig Start Date End Date Taking? Authorizing Provider  [employer] D3 2000 units CAPS Take 2,000 Units by mouth daily. 10/23/17  Yes [provider]  diphenhydrAMINE (  BENADRYL) 25 MG tablet Take 25 mg by mouth at bedtime.    Yes [provider]  gabapentin (NEURONTIN) 100 MG capsule Take 200 mg by mouth 3 (three) times daily. 07/21/18  Yes [provider]  haloperidol (HALDOL) 2 MG tablet Take 1 tablet (2 mg total) by mouth 2 (two) times daily. 01/11/19  Yes Mikhail, Maryann, DO  lactulose (CHRONULAC) 10 GM/15ML solution Take 45 mLs (30 g total) by mouth 2 (two) times daily as needed (To have at least 2-3 loose bowel movements per day.). 01/11/19  Yes Mikhail, Nita Sells, DO  Oxycodone HCl 10 MG TABS Take 10 mg by mouth every 8 (eight) hours. 10/18/18  Yes [provider]  pantoprazole (PROTONIX) 40 MG tablet Take 1 tablet (40 mg total) by mouth 2 (two) times daily before a meal. 01/11/19  Yes Mikhail, Nita Sells, DO  predniSONE (DELTASONE) 50 MG tablet Take 1 tablet (50 mg total) by mouth daily. 11/26/18  Yes Lawyer, Christopher, PA-C  SENSIPAR 30 MG tablet Take 30 mg by mouth daily. 07/25/17  Yes [provider]  colchicine 0.6 MG tablet Take 1 tablet (0.6 mg total) by mouth daily. Patient not taking: Reported on 11/05/2018 08/26/18 12/31/18  Tanda Rockers, PA-C     Josephine Igo, DO [facility] 02/11/2019 9:10 AM

## 2019-02-11 NOTE — Progress Notes (Signed)
[facility]/Trauma Progress Note      Assessment/Plan Schizoaffective disorder Chronic pain [name] Hepatitis C CKDw/ AKI - Cr 2.43 ABL anemia - Hgb 7.2  Small amount pneumoperitoneum GI bleed - Patient with known gastric and duodenal ulcers, small amount of free air on CT scan.I suspect he has a sealed perforated duodenal ulcer. Given his poor overall health, recommendcontinuingmedical management for nowwith bowel rest and IV abx. He has a high mortality with any surgical intervention given his severe liver disease. - S/P paracentesis11/13, cultures pending but NGTD - UGI Monday, If no leak, consider clear liquid diet   ID -Zosyn VTE -SCDs FEN -IVF, strict NPO Foley -none Follow up -TBD   LOS: 3 days    Subjective: CC: no complaints  Pt denies abdominal pain.   Objective: Vital signs in last 24 hours: Temp:  [97.8 F (36.6 C)-98.2 F (36.8 C)] 98.2 F (36.8 C) (11/15 0400) Pulse Rate:  [57-75] 64 (11/15 0600) Resp:  [17-23] 17 (11/15 0600) BP: (102-132)/(56-79) 130/62 (11/15 0600) SpO2:  [89 %-94 %] 89 % (11/15 0600) Weight:  [109.9 kg] 109.9 kg (11/15 0501) Last BM Date: 02/10/19  Intake/Output from previous day: 11/14 0701 - 11/15 0700 In: 2111.6 [I.V.:1476.7; IV Piggyback:634.9] Out: 1150 [Urine:1150] Intake/Output this shift: No intake/output data recorded.  PE:  Gen:  Alert, NAD, pleasant, appears older than stated age, pale Pulm:  Rate and effort normal Abd: Soft, mild distention, no peritonitis, mild TTP RUQ/epigastrium without guarding Skin: warm and dry   Anti-infectives: Anti-infectives (From admission, onward)   Start     Dose/Rate Route Frequency Ordered Stop   02/08/19 2200  piperacillin-tazobactam (ZOSYN) IVPB 3.375 g     3.375 g 12.5 mL/hr over 240 Minutes Intravenous Every 8 hours 02/08/19 1552     02/08/19 1545  piperacillin-tazobactam (ZOSYN) IVPB 3.375 g     3.375 g 100 mL/hr over 30 Minutes  Intravenous  Once 02/08/19 1533 02/08/19 1840      Lab Results:  Recent Labs    02/10/19 1111 02/11/19 0345  WBC 2.9* 3.9*  HGB 7.4* 7.6*  HCT 22.7* 23.6*  PLT 68* 81*   BMET Recent Labs    02/10/19 1111 02/11/19 0345  NA 140 142  K 3.9 3.6  CL 108 110  CO2 20* 19*  GLUCOSE 114* 83  BUN 43* 48*  CREATININE 2.34* 2.37*  CALCIUM 8.5* 8.9   PT/INR Recent Labs    02/09/19 1611 02/10/19 1111  LABPROT 24.7* 22.1*  INR 2.3* 2.0*   CMP     Component Value Date/Time   NA 142 02/11/2019 0345   K 3.6 02/11/2019 0345   CL 110 02/11/2019 0345   CO2 19 (L) 02/11/2019 0345   GLUCOSE 83 02/11/2019 0345   BUN 48 (H) 02/11/2019 0345   CREATININE 2.37 (H) 02/11/2019 0345   CALCIUM 8.9 02/11/2019 0345   CALCIUM 8.5 09/30/2010 0437   PROT 5.5 (L) 02/11/2019 0345   PROT 5.4 (L) 11/02/2017 1147   ALBUMIN 3.8 02/11/2019 0345   AST 15 02/11/2019 0345   ALT 9 02/11/2019 0345   ALKPHOS 31 (L) 02/11/2019 0345   BILITOT 1.6 (H) 02/11/2019 0345   GFRNONAA 29 (L) 02/11/2019 0345   GFRAA 34 (L) 02/11/2019 0345   Lipase     Component Value Date/Time   LIPASE 26 12/14/2018 0125    Studies/Results: US Renal  Result Date: 02/10/2019 CLINICAL DATA:  Acute kidney injury EXAM: RENAL / URINARY TRACT ULTRASOUND COMPLETE COMPARISON:  CT 02/08/2019 FINDINGS: Right Kidney: Renal measurements: 11.3 x 5.4 x 6.1 cm = volume: 194 mL. Multiple small cysts, the largest 3.2 cm in the upper pole. Mildly increased echotexture and cortical thinning. No hydronephrosis. Left Kidney: Renal measurements: 10.6 x 5.2 x 5.7 cm = volume: 162 mL. Small cysts, the largest 1.8 cm in the upper pole. Mildly increased echotexture and cortical thinning. No hydronephrosis. Bladder: Foley catheter in place, decompressed. Other: Moderate ascites noted. IMPRESSION: Mildly increased echotexture with cortical thinning compatible with chronic medical renal disease. No hydronephrosis. Moderate ascites. Electronically Signed    By: Rolm Baptise M.D.   On: 02/10/2019 21:46   Dg Chest Port 1 View  Result Date: 02/09/2019 CLINICAL DATA:  Central venous catheter placement. EXAM: PORTABLE CHEST 1 VIEW COMPARISON:  Chest x-ray 01/08/2019 FINDINGS: The right IJ catheter tip appears to be in good position approximately 4 cm below the carina. The patient is significantly rotated to the left but I do not see any evidence of a pneumothorax or hematoma. IMPRESSION: Right IJ central venous catheter in good position without complicating features. Patient significantly rotated to the left making it difficult to assess the left lower lobe. Electronically Signed   By: Marijo Sanes M.D.   On: 02/09/2019 18:48   [place] Ekg Site Rite  Result Date: 02/09/2019 If Site Rite image not attached, placement could not be confirmed due to current cardiac rhythm.    Kalman Drape, PA-C [facility] Please see amion for pager for the following: Myna Hidalgo, W, & Friday 7:00am - 4:30pm Thursdays 7:00am -11:30am

## 2019-02-11 NOTE — Progress Notes (Signed)
[place] Progress Note Patient Name: [name] DOB: 1960-09-16 MRN: [id number]   Date of Service  02/11/2019  HPI/Events of Note  Asking for Artane for PTSD. End stage cirrhosis, abdominal pain. Getting haldol/fenta.  Camera: Discussed with bed side RN and patient. VS stable. On room air.   eICU Interventions  -Artane 2 mg oral once and to give haldol once more if needed for agitation. - asp precautions. - will consider dilaudid if fenta not helping.      Intervention Category Intermediate Interventions: Change in mental status - evaluation and management  [name] 02/11/2019, 9:37 PM

## 2019-02-12 ENCOUNTER — Inpatient Hospital Stay: Payer: Self-pay

## 2019-02-12 ENCOUNTER — Inpatient Hospital Stay (HOSPITAL_COMMUNITY): Payer: Medicaid Other

## 2019-02-12 DIAGNOSIS — K921 Melena: Secondary | ICD-10-CM | POA: Diagnosis not present

## 2019-02-12 DIAGNOSIS — D62 Acute posthemorrhagic anemia: Secondary | ICD-10-CM

## 2019-02-12 DIAGNOSIS — K7031 Alcoholic cirrhosis of liver with ascites: Secondary | ICD-10-CM | POA: Diagnosis not present

## 2019-02-12 DIAGNOSIS — K255 Chronic or unspecified gastric ulcer with perforation: Secondary | ICD-10-CM

## 2019-02-12 DIAGNOSIS — Z7189 Other specified counseling: Secondary | ICD-10-CM

## 2019-02-12 DIAGNOSIS — R1084 Generalized abdominal pain: Secondary | ICD-10-CM

## 2019-02-12 LAB — COMPREHENSIVE METABOLIC PANEL
ALT: 9 U/L (ref 0–44)
AST: 11 U/L — ABNORMAL LOW (ref 15–41)
Albumin: 3.7 g/dL (ref 3.5–5.0)
Alkaline Phosphatase: 32 U/L — ABNORMAL LOW (ref 38–126)
Anion gap: 14 (ref 5–15)
BUN: 49 mg/dL — ABNORMAL HIGH (ref 6–20)
CO2: 19 mmol/L — ABNORMAL LOW (ref 22–32)
Calcium: 9.2 mg/dL (ref 8.9–10.3)
Chloride: 113 mmol/L — ABNORMAL HIGH (ref 98–111)
Creatinine, Ser: 2.49 mg/dL — ABNORMAL HIGH (ref 0.61–1.24)
GFR calc Af Amer: 32 mL/min — ABNORMAL LOW (ref >60)
GFR calc non Af Amer: 27 mL/min — ABNORMAL LOW (ref >60)
Glucose, Bld: 91 mg/dL (ref 70–99)
Potassium: 3.3 mmol/L — ABNORMAL LOW (ref 3.5–5.1)
Sodium: 146 mmol/L — ABNORMAL HIGH (ref 135–145)
Total Bilirubin: 2 mg/dL — ABNORMAL HIGH (ref 0.3–1.2)
Total Protein: 5.8 g/dL — ABNORMAL LOW (ref 6.5–8.1)

## 2019-02-12 LAB — GLUCOSE, CAPILLARY
Glucose-Capillary: 74 mg/dL (ref 70–99)
Glucose-Capillary: 76 mg/dL (ref 70–99)
Glucose-Capillary: 78 mg/dL (ref 70–99)
Glucose-Capillary: 78 mg/dL (ref 70–99)
Glucose-Capillary: 87 mg/dL (ref 70–99)
Glucose-Capillary: 91 mg/dL (ref 70–99)
Glucose-Capillary: 97 mg/dL (ref 70–99)

## 2019-02-12 LAB — CBC
HCT: 25.6 % — ABNORMAL LOW (ref 39.0–52.0)
Hemoglobin: 8.4 g/dL — ABNORMAL LOW (ref 13.0–17.0)
MCH: 30.2 pg (ref 26.0–34.0)
MCHC: 32.8 g/dL (ref 30.0–36.0)
MCV: 92.1 fL (ref 80.0–100.0)
Platelets: 77 10*3/uL — ABNORMAL LOW (ref 150–400)
RBC: 2.78 MIL/uL — ABNORMAL LOW (ref 4.22–5.81)
RDW: 18.6 % — ABNORMAL HIGH (ref 11.5–15.5)
WBC: 3.4 10*3/uL — ABNORMAL LOW (ref 4.0–10.5)
nRBC: 0 % (ref 0.0–0.2)

## 2019-02-12 MED ORDER — IOHEXOL 300 MG/ML  SOLN
150.0000 mL | Freq: Once | INTRAMUSCULAR | Status: AC | PRN
  Administered 2019-02-12: 150 mL via ORAL

## 2019-02-12 MED ORDER — POTASSIUM CHLORIDE 10 MEQ/100ML IV SOLN
10.0000 meq | INTRAVENOUS | Status: AC
  Administered 2019-02-12 (×4): 10 meq via INTRAVENOUS
  Filled 2019-02-12 (×4): qty 100

## 2019-02-12 MED ORDER — LACTULOSE ENEMA
300.0000 mL | Freq: Two times a day (BID) | ORAL | Status: DC
  Filled 2019-02-12 (×5): qty 300

## 2019-02-12 MED ORDER — SODIUM CHLORIDE 0.9 % IV SOLN
INTRAVENOUS | Status: DC | PRN
  Administered 2019-02-12 – 2019-02-14 (×3): 250 mL via INTRAVENOUS
  Administered 2019-02-15: 1000 mL via INTRAVENOUS

## 2019-02-12 NOTE — Progress Notes (Signed)
RN went in to patients room to give evening medication. Pt stated that he would like a psych consult. RN asked if there was a specific reason for the consult and pt stated "because I want to kill myself." RN asked if he actively had a plan and wanted to right now. He said "No. Just ideas." RN called [facility] MD, Dr. Doristine Bosworth and notified him of change in patient. MD said he will put in orders. RN will stay with patient due to no one available to sit with patient.

## 2019-02-12 NOTE — Progress Notes (Signed)
Patient arrived to unit. Alert and oriented. Sitter at bedside.

## 2019-02-12 NOTE — Progress Notes (Signed)
PROGRESS NOTE    [name]  BHA:[id number] DOB: 03-01-1961 DOA: 02/08/2019 PCP: Fleet Contras, MD   Brief Narrative:  58 y.o.malewith medical history significant ofcirrhosis with splenomegaly, esophageal varices, hepatitis C, adrenal insufficiency, hypothyroidism, schizoaffective disorder, PTSD, GI bleed, neuropathy, chronic kidney disease presented to emergency department due to worsening abdominal pain, distention associated with rectal bleeding for 1 day.  Work-up revealed bowel perforation/pneumoperitoneum.  Surgery consulted and recommended no operative intervention due to high mortality risk, antibiotics and bowel rest.  GI was also consulted for possible paracentesis due to significant ascites as well as dark maroon stools rectal and acute blood loss anemia.  Protonix infusion was initiated as well as octreotide as patient has history of esophageal varices.  He was admitted to stepdown status.  He remained in ER while awaiting bed.  He continued to have borderline blood pressure, progressing to hypotension.  PCCM consulted for management.  Of note: EGD from 01/10/2019 which showed acute esophagitis and nonbleeding large gastric ulcer in antrum. Has been treated for Hep C in 2019 at [facility].  Patient is being transferred to [facility] on 02/12/2019.  Assessment & Plan:   Principal Problem:   Bowel perforation (HCC) Active Problems:   Schizoaffective disorder (HCC)   History of adrenal insufficiency   GI bleed   Cirrhosis (HCC)   Esophageal varices in cirrhosis (HCC)   CKD (chronic kidney disease) stage 3, GFR 30-59 ml/min   Abdominal pain   Bleeding per rectum   Septic shock (HCC)   Central venous catheter in place   Palliative care encounter  Septic shock due to bowel perforation/pneumoperitoneum/gastric perforation: Evaluated by [facility] and deemed to not be a surgical candidate given high mortality. Recent endoscopy with no esophageal varices.  He was on vasopressors at one  point in time in ICU.  Has been off of vasopressors since 02/10/2019.  Received couple of doses of albumin.  He is on midodrine.  Continue Zosyn.  Upper GI series showed gastric perforation with contained pneumoperitoneum.  General surgery once again does not recommend surgical intervention.  He is n.p.o.  Will be started on TPN today.  Hx adrenal insufficiency (on 50mg  prednisone daily).  He is on same dose here.  Cirrhosis with ascites - MELD 23, Child Pugh C. Hx esophageal varices, HCV  (treated with Epclusa in 2018, followed by liver clinic at [facility]). -His cirrhosis is a significant comorbidity Palliative care consulted.  Anemia with GIB - Due to known gastric & duodenal ulcers (had EGD Oct 2020 which showed grade D esophagitis, 2cm hiatal hernia, 5cm non-bleeding gastric ulcer and non-bleeding duodenal ulcers). -Hemoglobin has remained over 7 since admission.  Transfuse for hemoglobin less than 7 -Continue PPI twice daily  Acute renal failure in the setting of CKD stage III: Baseline creatinine 1.7-2.0.  Currently stable around 2.4.  Renal ultrasound shows CKD.  UA negative.  Nephrology on board and management per them.  Hx depression, PTSD, schizoaffective disorder, chronic pain. -Pain control with fentanyl -As needed Haldol, scheduled p.o. Haldol once he is able to take p.o. again.  DVT prophylaxis: SCD Code Status: Full code Family Communication:  None present at bedside.  Disposition Plan: To be determined  Estimated body mass index is 27.92 kg/m as calculated from the following:   Height as of this encounter: 6\' 6"  (1.981 m).   Weight as of this encounter: 109.6 kg.      Nutritional status:  Consultants:   GI, general surgery, nephrology, palliative care  Procedures:   None  Antimicrobials:   IV Zosyn   Subjective: Patient seen and examined in ICU.  No complaint.  He kept asking about water.  Objective: Vitals:    02/12/19 1100 02/12/19 1152 02/12/19 1200 02/12/19 1300  BP: 100/60  (!) 101/56 107/71  Pulse: 77  70 70  Resp: (!) 27  (!) 22 (!) 22  Temp:  98.4 F (36.9 C)    TempSrc:  Oral    SpO2: 96%  94% 94%  Weight:      Height:        Intake/Output Summary (Last 24 hours) at 02/12/2019 1308 Last data filed at 02/12/2019 1146 Gross per 24 hour  Intake 394.29 ml  Output 1200 ml  Net -805.71 ml   Filed Weights   02/11/19 0500 02/11/19 0501 02/12/19 0136  Weight: 109.9 kg 109.9 kg 109.6 kg    Examination:  General exam: Appears calm and comfortable but cachectic Respiratory system: Clear to auscultation. Respiratory effort normal. Cardiovascular system: S1 & S2 heard, RRR. No JVD, murmurs, rubs, gallops or clicks. No pedal edema. Gastrointestinal system: Abdomen is nondistended, soft and tender. No organomegaly or masses felt. Normal bowel sounds heard. Central nervous system: Alert and oriented. No focal neurological deficits. Extremities: Symmetric 5 x 5 power. Skin: No rashes, lesions or ulcers Psychiatry: Judgement and insight appear poor.  Mood & affect flat.   Data Reviewed: I have personally reviewed following labs and imaging studies  CBC: Recent Labs  Lab 02/08/19 1149 02/09/19 0317 02/09/19 1611 02/10/19 1111 02/11/19 0345 02/12/19 0444  WBC 6.1 7.7 5.0 2.9* 3.9* 3.4*  NEUTROABS 4.1  --   --   --   --   --   HGB 8.5* 8.3* 7.2* 7.4* 7.6* 8.4*  HCT 28.4* 26.4* 23.3* 22.7* 23.6* 25.6*  MCV 99.6 97.4 97.1 92.7 92.5 92.1  PLT 159 113* 71*   69* 68* 81* 77*   Basic Metabolic Panel: Recent Labs  Lab 02/08/19 1149 02/08/19 1709 02/09/19 0317 02/10/19 1111 02/11/19 0345 02/12/19 0444  NA 140  --  139 140 142 146*  K 4.0  --  4.5 3.9 3.6 3.3*  CL 112*  --  111 108 110 113*  CO2 19*  --  19* 20* 19* 19*  GLUCOSE 91  --  73 114* 83 91  BUN 26*  --  35* 43* 48* 49*  CREATININE 1.98*  --  2.43* 2.34* 2.37* 2.49*  CALCIUM 7.9*  --  7.8* 8.5* 8.9 9.2  MG  --  1.8   --   --   --   --   PHOS  --  3.8  --   --   --   --    GFR: Estimated Creatinine Clearance: 41.8 mL/min (A) (by C-G formula based on SCr of 2.49 mg/dL (H)). Liver Function Tests: Recent Labs  Lab 02/08/19 1149 02/09/19 0317 02/10/19 1111 02/11/19 0345 02/12/19 0444  AST 15 16 15 15  11*  ALT 8 10 10 9 9   ALKPHOS 59 57 37* 31* 32*  BILITOT 0.9 1.3* 2.0* 1.6* 2.0*  PROT 4.7* 4.3* 5.2* 5.5* 5.8*  ALBUMIN 2.2* 1.9* 3.1* 3.8 3.7   No results for input(s): LIPASE, AMYLASE in the last 168 hours. Recent Labs  Lab 02/08/19 1709 02/10/19 1111  AMMONIA 72* 32   Coagulation Profile: Recent Labs  Lab 02/08/19 1522 02/09/19 0317 02/09/19 1611 02/10/19 1111  INR 1.6*  1.8* 2.3* 2.0*   Cardiac Enzymes: No results for input(s): CKTOTAL, CKMB, CKMBINDEX, TROPONINI in the last 168 hours. BNP (last 3 results) No results for input(s): PROBNP in the last 8760 hours. HbA1C: Recent Labs    02/09/19 1419  HGBA1C 3.9*   CBG: Recent Labs  Lab 02/11/19 1548 02/11/19 1913 02/12/19 0001 02/12/19 0318 02/12/19 0725  GLUCAP 94 101* 97 87 74   Lipid Profile: No results for input(s): CHOL, HDL, LDLCALC, TRIG, CHOLHDL, LDLDIRECT in the last 72 hours. Thyroid Function Tests: No results for input(s): TSH, T4TOTAL, FREET4, T3FREE, THYROIDAB in the last 72 hours. Anemia Panel: No results for input(s): VITAMINB12, FOLATE, FERRITIN, TIBC, IRON, RETICCTPCT in the last 72 hours. Sepsis Labs: Recent Labs  Lab 02/09/19 1405 02/10/19 1111  LATICACIDVEN 3.3* 1.6    Recent Results (from the past 240 hour(s))  SARS CORONAVIRUS 2 (TAT 6-24 HRS) Nasopharyngeal Nasopharyngeal Swab     Status: None   Collection Time: 02/08/19  3:20 PM   Specimen: Nasopharyngeal Swab  Result Value Ref Range Status   SARS Coronavirus 2 NEGATIVE NEGATIVE Final    Comment: (NOTE) SARS-CoV-2 target nucleic acids are NOT DETECTED. The SARS-CoV-2 RNA is generally detectable in upper and lower respiratory specimens  during the acute phase of infection. Negative results do not preclude SARS-CoV-2 infection, do not rule out co-infections with other pathogens, and should not be used as the sole basis for treatment or other patient management decisions. Negative results must be combined with clinical observations, patient history, and epidemiological information. The expected result is Negative. Fact Sheet for Patients: HairSlick.nohttps://www.fda.gov/media/138098/download Fact Sheet for Healthcare Providers: quierodirigir.comhttps://www.fda.gov/media/138095/download This test is not yet approved or cleared by the [place] FDA and  has been authorized for detection and/or diagnosis of SARS-CoV-2 by FDA under an Emergency Use Authorization (EUA). This EUA will remain  in effect (meaning this test can be used) for the duration of the COVID-19 declaration under Section 56 4(b)(1) of the Act, 21 U.S.C. section 360bbb-3(b)(1), unless the authorization is terminated or revoked sooner. Performed at [facility], [address] [place], [place] [address]   Blood culture (routine x 2)     Status: None (Preliminary result)   Collection Time: 02/08/19  5:08 PM   Specimen: BLOOD RIGHT WRIST  Result Value Ref Range Status   Specimen Description BLOOD RIGHT WRIST  Final   Special Requests   Final    AEROBIC BOTTLE ONLY Blood Culture results may not be optimal due to an inadequate volume of blood received in culture bottles   Culture   Final    NO GROWTH 4 DAYS Performed at [facility], [address] [place], [place] [address]    Report Status PENDING  Incomplete  Blood culture (routine x 2)     Status: None (Preliminary result)   Collection Time: 02/08/19  5:10 PM   Specimen: BLOOD RIGHT HAND  Result Value Ref Range Status   Specimen Description BLOOD RIGHT HAND  Final   Special Requests   Final    BOTTLES DRAWN AEROBIC AND ANAEROBIC Blood Culture results may not be optimal due to an inadequate volume of blood  received in culture bottles   Culture   Final    NO GROWTH 4 DAYS Performed at [facility], [address] [place], [place] [address]    Report Status PENDING  Incomplete  MRSA PCR Screening     Status: None   Collection Time: 02/09/19  3:30 PM   Specimen: Nasopharyngeal  Result Value Ref Range Status   MRSA by PCR NEGATIVE NEGATIVE Final    Comment:        The GeneXpert MRSA Assay (FDA approved for NASAL specimens only), is one component of a comprehensive MRSA colonization surveillance program. It is not intended to diagnose MRSA infection nor to guide or monitor treatment for MRSA infections. Performed at [facility], [address] [place], [place] [address]   Body fluid culture (includes gram stain)     Status: None (Preliminary result)   Collection Time: 02/09/19  5:30 PM   Specimen: Pleural Fluid  Result Value Ref Range Status   Specimen Description FLUID  Final   Special Requests NONE  Final   Gram Stain   Final    FEW WBC PRESENT, PREDOMINANTLY PMN NO ORGANISMS SEEN    Culture   Final    NO GROWTH 3 DAYS Performed at [facility], [address], [place], [place] [address]    Report Status PENDING  Incomplete      Radiology Studies: US Renal  Result Date: 02/10/2019 CLINICAL DATA:  Acute kidney injury EXAM: RENAL / URINARY TRACT ULTRASOUND COMPLETE COMPARISON:  CT 02/08/2019 FINDINGS: Right Kidney: Renal measurements: 11.3 x 5.4 x 6.1 cm = volume: 194 mL. Multiple small cysts, the largest 3.2 cm in the upper pole. Mildly increased echotexture and cortical thinning. No hydronephrosis. Left Kidney: Renal measurements: 10.6 x 5.2 x 5.7 cm = volume: 162 mL. Small cysts, the largest 1.8 cm in the upper pole. Mildly increased echotexture and cortical thinning. No hydronephrosis. Bladder: Foley catheter in place, decompressed. Other: Moderate ascites noted. IMPRESSION: Mildly increased echotexture with cortical thinning compatible with chronic  medical renal disease. No hydronephrosis. Moderate ascites. Electronically Signed   By: Charlett Nose M.D.   On: 02/10/2019 21:46   Dg Kayleen Memos W Single Cm (sol Or Thin Ba)  Addendum Date: 02/12/2019   ADDENDUM REPORT: 02/12/2019 09:24 ADDENDUM: Critical Value/emergent results were called by telephone at the time of interpretation on 02/12/2019 at 9:20 am to providerJESSICA [facility] for Dr Abigail Miyamoto, who verbally acknowledged these results. Electronically Signed   By: Carey Bullocks M.D.   On: 02/12/2019 09:24   Result Date: 02/12/2019 CLINICAL DATA:  GI bleed with free intraperitoneal air on abdominal CT. EXAM: UPPER GI SERIES WITH KUB TECHNIQUE: After obtaining a scout radiograph a routine upper GI series was performed using water-soluble contrast (150 cc Omnipaque 300 orally). FLUOROSCOPY TIME:  Fluoroscopy Time: 1 minutes and 0 seconds of low-dose pulsed fluoroscopy. Radiation Exposure Index (if provided by the fluoroscopic device): 1 scout image. Five spot images. Number of Acquired Spot Images: 44.2 mGy COMPARISON:  Abdominopelvic CT 02/08/2019. FINDINGS: The scout abdominal radiograph demonstrates a normal bowel gas pattern and no obvious free intraperitoneal air. Mild lumbar spine degenerative changes are present. The fluoroscopic study is somewhat limited by the patient's limited mobility. The patient swallowed the contrast without difficulty. The esophagus demonstrates mild tertiary contractions without stricture or focal mucosal lesion. There is mild extrinsic compression on the distal esophagus by the patient's known esophageal varices. There is a focal contained perforation involving the distal stomach or proximal duodenum, likely pre-pyloric based on the oblique views and recent CT. No free extravasation of the contrast was demonstrated. The remainder of the stomach and proximal duodenum appear normal without obstruction. IMPRESSION: 1. Focal contained perforation in the distal stomach,  likely accounting for the free intraperitoneal  air on recent CT. No free extravasation of contrast or upper GI obstruction. 2. Mild esophageal tertiary contractions.  Known esophageal varices. Electronically Signed: By: Richardean Sale M.D. On: 02/12/2019 09:14    Scheduled Meds:  Chlorhexidine Gluconate Cloth  6 each Topical Daily   hydrocortisone sod succinate (SOLU-CORTEF) inj  50 mg Intravenous Daily   insulin aspart  0-9 Units Subcutaneous Q4H   lactulose  300 mL Rectal BID   midodrine  10 mg Oral TID WC   pantoprazole (PROTONIX) IV  40 mg Intravenous Q12H   Continuous Infusions:  sodium chloride     sodium chloride Stopped (02/12/19 1040)   sodium chloride Stopped (02/12/19 0947)   phytonadione (VITAMIN K) IV     piperacillin-tazobactam (ZOSYN)  IV 12.5 mL/hr at 02/12/19 1100   potassium chloride 10 mEq (02/12/19 1306)     LOS: 4 days   Time spent: 35 minutes   Darliss Cheney, MD [facility]  02/12/2019, 1:08 PM   To contact the attending provider between 7A-7P or the covering provider during after hours 7P-7A, please log into the web site [email or web address] and use password [email or web address].

## 2019-02-12 NOTE — Progress Notes (Signed)
Pt stated that "I lied about wanting to kill myself." RN asked why. Pt stated "in order to get a psych consult." RN notified Dr. Doristine Bosworth. MD said that we will keep psych consult.

## 2019-02-12 NOTE — Progress Notes (Signed)
Daily Progress Note   Patient Name: [name]       Date: 02/12/19 DOB: [birth year]  Age: 58 y.o. MRN#: [id number] Attending Physician: Hughie Closs, MD Primary Care Physician: Fleet Contras, MD Admit Date: 02/08/2019  Reason for Consultation/Follow-up: Establishing goals of care  Subjective/GOC: Patient is awake, alert, oriented this afternoon but difficult to keep on track for GOC discussion. He repeatedly asks for pain medication and water/swabs. RN brought patient pain medication.   Prior to admission, patient living at home. He tells me family lives in [place] but he has friend support locally. He was ambulating with a cane. He reports diagnosis of liver cirrhosis for about 2 years.  Again, patient continues to ask for drink. Explained the importance of maintaining NPO status because of his diagnoses. Explored patients understanding of diagnoses. Patient can verbalize that he has a 'hole in my stomach' and 'high risk for surgery.' Also his understanding that he cannot eat but should be able to drink. Explained plan to start PICC/TPN today.   Attempted to discuss guarded prognosis with current clinical condition and underlying co-morbidities. Patient shares that he had a similar condition two years ago and made it through that hospitalization.   Attempted to explore if patient has documented POA/living will documentation. He states "I don't have one yet" and that he makes his own decisions. Explained that yes, he absolutely makes his own medical decisions since he is coherent to do so but also 'preparing for the worst' and who would he trust to make medical decisions for him? Patient does not answer this question.   Attempted to discuss code status and what his wishes are in regards to  aggressive medical interventions. Patient states "I want to stay alive" and "everything possible" even if this meant he would need to live in a nursing home on life support measures. Questionable if patient grasps understanding of this discussion.   Again, very difficult to keep patient on track with discussion as he continues to ask for water and swabs. He demands that I call his friend Ahmic (no answer) and also Gita Kudo (community health RN--also no answer vm left) and patient gives this NP permission to share medical information with friends Ahmic and Emi Holes and his RN Gita Kudo.   **This afternoon, received call back from [facility]. Lowella Bandy  has been an Youth worker for [employer] in the community. She assists with medication management and ensures he has appointments set up with [facility] (behavioral health support), PCP, and at the pain clinic.   Lexine Baton shares her understanding that the patient's mother died in 2022/05/03 and his brother committed suicide. She states that it has been "downhill for him" since then. Lexine Baton shares that he can communicate fairly well with English but she will intermittently use Bosnian interpretor to make sure Nickalaus understands.   Lexine Baton shares that follows with Dr. Laure Kidney at [facility] [phone]) and [facility] with Dr. De Burrs [phone]). They "know a lot about him" if questions or concerns.   I did update Nikki on course of hospitalization including diagnoses, interventions, and plan of care. Lexine Baton confirms she is available to assist with home health services when appropriate and will make sure Brodric continues to receive support in the home if able to return back home.    Length of Stay: 4  Current Medications: Scheduled Meds:  . Chlorhexidine Gluconate Cloth  6 each Topical Daily  . hydrocortisone sod succinate (SOLU-CORTEF) inj  50 mg Intravenous Daily  . insulin aspart  0-9 Units Subcutaneous Q4H  . lactulose  300 mL  Rectal BID  . midodrine  10 mg Oral TID WC  . pantoprazole (PROTONIX) IV  40 mg Intravenous Q12H    Continuous Infusions: . sodium chloride    . sodium chloride Stopped (02/12/19 1040)  . sodium chloride Stopped (02/12/19 0947)  . phytonadione (VITAMIN K) IV    . piperacillin-tazobactam (ZOSYN)  IV 12.5 mL/hr at 02/12/19 1100    PRN Meds: Place/Maintain arterial line **AND** sodium chloride, sodium chloride, fentaNYL (SUBLIMAZE) injection, haloperidol **OR** haloperidol lactate, haloperidol lactate, ondansetron (ZOFRAN) IV  Physical Exam Vitals signs and nursing note reviewed.  Constitutional:      General: He is awake.  HENT:     Head: Normocephalic and atraumatic.  Cardiovascular:     Rate and Rhythm: Regular rhythm.  Pulmonary:     Effort: No tachypnea, accessory muscle usage or respiratory distress.  Skin:    General: Skin is warm and dry.  Neurological:     Mental Status: He is alert and oriented to person, place, and time.  Psychiatric:        Mood and Affect: Mood is anxious.            Vital Signs: BP 107/71   Pulse 70   Temp 98.4 F (36.9 C) (Oral)   Resp (!) 22   Ht 6\' 6"  (1.981 m)   Wt 109.6 kg   SpO2 94%   BMI 27.92 kg/m  SpO2: SpO2: 94 % O2 Device: O2 Device: Room Air O2 Flow Rate:    Intake/output summary:   Intake/Output Summary (Last 24 hours) at 02/12/2019 1413 Last data filed at 02/12/2019 1146 Gross per 24 hour  Intake 381.84 ml  Output 1050 ml  Net -668.16 ml   LBM: Last BM Date: 02/10/19 Baseline Weight: Weight: 99.8 kg Most recent weight: Weight: 109.6 kg       Palliative Assessment/Data: PPS 40%      Patient Active Problem List   Diagnosis Date Noted  . Palliative care encounter   . Septic shock ([place]) 02/09/2019  . Intra-abdominal free air of unknown etiology   . Central venous catheter in place   . Abdominal pain 02/08/2019  . Bowel perforation ([place]) 02/08/2019  . Bleeding per rectum   . Acute  gastric ulcer   .  Duodenal ulcer   . Acute esophagitis   . Acute blood loss anemia   . Hematemesis 01/08/2019  . Prolonged QT interval 01/08/2019  . Esophageal varices in cirrhosis (HCC) 01/08/2019  . CKD (chronic kidney disease) stage 3, GFR 30-59 ml/min 01/08/2019  . Suicidal ideation 01/08/2019  . Closed displaced fracture of neck of left fifth metacarpal bone 01/08/2019  . GI bleed 08/07/2018  . Acute abdominal pain   . Cirrhosis (HCC)   . Generalized abdominal pain   . Hematemesis with nausea   . Evaluation by psychiatric service required   . Acute upper gastrointestinal bleeding 10/15/2017  . PTSD (post-traumatic stress disorder)   . Hypothyroidism   . History of adrenal insufficiency   . H/O diabetes insipidus   . Schizoaffective disorder (HCC) 11/08/2011  . Personality disorder (HCC)   . Normocytic anemia   . Ascites   . Liver disease   . Hepatitis C   . Kidney disease   . GERD (gastroesophageal reflux disease)     Palliative Care Assessment & Plan   Patient Profile: Palliative Care consult requested for this 58 y.o. 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  CT of abdomen and pelvis revealed pneumoperitoneum from bowel perforation.  Surgery was consulted but patient was not felt to be a surgical candidate due to the high risk from comorbidities.  He has been treated conservatively.  Palliative care was consulted to help address goals.  Assessment: Septic shock Bowel perforation/pneumonperitoneum/gastric perforation Cirrhosis Ascites Anemia with GIB ARF with CKD stage III Hx of depression, PTSD, schizoaffective disorder Hx of chronic pain  Recommendations/Plan:  Continue FULL code/FULL scope treatment  Challenging goals of care discussion as patient  continues to ask for water/swabs.   Patient does give staff permission to share medical information with his community health RN Gita Kudo(Nikki Morgan ) and also friends Emi Holes([name] and EclecticAhmic).  Patient not interested in documenting a POA and tells NP he will make his own medical decisions.   Patient desires FULL code/FULL scope treatment. "I want to stay alive" and "everything possible."   PMT will continue to follow and attempt further discussions pending clinical course.   Patient does speak english but community health RN reports that it may be helpful to include Bosnian interpretor.    Goals of Care and Additional Recommendations:  Limitations on Scope of Treatment: Full Scope Treatment  Code Status: FULL   Code Status Orders  (From admission, onward)         Start     Ordered   02/08/19 1552  Full code  Continuous     02/08/19 1553        Code Status History    Date Active Date Inactive Code Status Order ID Comments User Context   01/08/2019 1118 01/11/2019 2224 Full Code [id number]  Clydie BraunSmith, Rondell A, MD ED   08/07/2018 0935 08/08/2018 1329 Full Code [id number]  Alwyn RenMathews, Elizabeth G, MD ED   10/15/2017 0943 10/18/2017 1813 Full Code [id number]  Lynnell CatalanAgarwala, Ravi, MD ED   01/17/2013 1534 01/23/2013 1730 Full Code [id number]  Trevor MaceAlbert, Robyn M, PA-C ED   12/30/2011 0256 12/31/2011 1312 Full Code [id number]  Liliane BadeFlinn, Kristen L, RN Inpatient   12/24/2011 1816 12/30/2011 0256 Full Code [id number]  Jones SkeneBonk, John-Adam, MD ED   12/23/2011  1522 12/24/2011 1107 Full Code [id number]  Raeford RazorKohut, Stephen, MD ED   11/08/2011 0008 11/08/2011 2324 Full Code [id number]  Ward GivensKnapp, Iva L, MD ED   Advance Care Planning Activity       Prognosis:  Guarded-poor  Discharge Planning:  To Be Determined  Care plan was discussed with patient, RN, patient's community health RN Gita Kudo(Nikki Morgan)  Thank you for allowing the Palliative Medicine Team to assist in the care of this patient.   Time In: 1310- 1500-  Time Out: 1400 1520 Total  Time 70 Prolonged Time Billed  yes      Greater than 50%  of this time was spent counseling and coordinating care related to the above assessment and plan.  Vennie HomansMegan Derry Arbogast, DNP, FNP-C Palliative Medicine Team  Phone: [phone] Fax: [phone]  Please contact Palliative Medicine Team phone at [phone] for questions and concerns.

## 2019-02-12 NOTE — Plan of Care (Signed)
  Problem: Clinical Measurements: Goal: Diagnostic test results will improve Outcome: Progressing Goal: Respiratory complications will improve Outcome: Progressing Note: Pt is on room air maintaining appropriate oxygen saturations   Problem: Elimination: Goal: Will not experience complications related to bowel motility Outcome: Progressing   Problem: Pain Managment: Goal: General experience of comfort will improve Outcome: Progressing   Problem: Safety: Goal: Ability to remain free from injury will improve Outcome: Progressing   Problem: Activity: Goal: Risk for activity intolerance will decrease Outcome: Not Progressing Note: Due to GI bleed, pt is unable to mobilize out of bed.    Problem: Nutrition: Goal: Adequate nutrition will be maintained Outcome: Not Progressing Note: Pt has been NPO for a few days. Due to procedure today, pt remains NPO. Results of Upper GI could determine if a diet is allowed.

## 2019-02-12 NOTE — Progress Notes (Addendum)
UGI today showed Focal contained perforation in the distal stomach, likely accounting for the free intraperitoneal air on recent CT. No free extravasation of contrast or upper GI obstruction. No indication for emergent surgical intervention.   Recommend continued NPO and starting TNA PICC vs central line? Per medicine   Jackson Latino, [facility] Surgery Pager [phone]

## 2019-02-12 NOTE — Progress Notes (Signed)
[facility]/Trauma Progress Note      Assessment/Plan Schizoaffective disorder Chronic pain [name] Hepatitis C CKDw/ AKI - Cr 2.43 ABL anemia - Hgb7.2  Small amount pneumoperitoneum GI bleed - Patient with known gastric and duodenal ulcers, small amount of free air on CT scan.I suspect he has a sealed perforated duodenal ulcer. Given his poor overall health, recommendcontinuingmedical management for nowwith bowel rest and IV abx. He has a high mortality with any surgical intervention given his severe liver disease. - S/Pparacentesis11/13, cultures pending but NGTD, fungal culture also pending -UGI today,If no leak, consider clear liquid diet  ID -Zosyn VTE -SCDs FEN -IVF, strict NPO Foley -none Follow up -TBD   LOS: 4 days    Subjective: CC: abdominal pain  Pt states very mild abdominal pain but nurse reports 10 doses of fentanyl overnight. No N or V. Going for UGI this am.   Objective: Vital signs in last 24 hours: Temp:  [97.9 F (36.6 C)-98.4 F (36.9 C)] 98.4 F (36.9 C) (11/16 0800) Pulse Rate:  [61-74] 69 (11/16 0800) Resp:  [17-31] 20 (11/16 0800) BP: (100-138)/(50-78) 100/56 (11/16 0800) SpO2:  [90 %-94 %] 93 % (11/16 0800) Weight:  [109.6 kg] 109.6 kg (11/16 0136) Last BM Date: 02/10/19  Intake/Output from previous day: 11/15 0701 - 11/16 0700 In: 363.9 [I.V.:13.7; IV Piggyback:350.2] Out: 2542 [Urine:1375] Intake/Output this shift: Total I/O In: 8.6 [I.V.:8.6] Out: 130 [Urine:130]  PE:  Gen: Alert, NAD, pleasant, appears older than stated age, pale Pulm:Rate andeffort normal Abd: Soft, moderate distention, no peritonitis, mild TTP RUQ/epigastrium without guarding Skin:warm and dry   Anti-infectives: Anti-infectives (From admission, onward)   Start     Dose/Rate Route Frequency Ordered Stop   02/08/19 2200  piperacillin-tazobactam (ZOSYN) IVPB 3.375 g     3.375 g 12.5 mL/hr over 240 Minutes  Intravenous Every 8 hours 02/08/19 1552     02/08/19 1545  piperacillin-tazobactam (ZOSYN) IVPB 3.375 g     3.375 g 100 mL/hr over 30 Minutes Intravenous  Once 02/08/19 1533 02/08/19 1840      Lab Results:  Recent Labs    02/11/19 0345 02/12/19 0444  WBC 3.9* 3.4*  HGB 7.6* 8.4*  HCT 23.6* 25.6*  PLT 81* 77*   BMET Recent Labs    02/11/19 0345 02/12/19 0444  NA 142 146*  K 3.6 3.3*  CL 110 113*  CO2 19* 19*  GLUCOSE 83 91  BUN 48* 49*  CREATININE 2.37* 2.49*  CALCIUM 8.9 9.2   PT/INR Recent Labs    02/09/19 1611 02/10/19 1111  LABPROT 24.7* 22.1*  INR 2.3* 2.0*   CMP     Component Value Date/Time   NA 146 (H) 02/12/2019 0444   K 3.3 (L) 02/12/2019 0444   CL 113 (H) 02/12/2019 0444   CO2 19 (L) 02/12/2019 0444   GLUCOSE 91 02/12/2019 0444   BUN 49 (H) 02/12/2019 0444   CREATININE 2.49 (H) 02/12/2019 0444   CALCIUM 9.2 02/12/2019 0444   CALCIUM 8.5 09/30/2010 0437   PROT 5.8 (L) 02/12/2019 0444   PROT 5.4 (L) 11/02/2017 1147   ALBUMIN 3.7 02/12/2019 0444   AST 11 (L) 02/12/2019 0444   ALT 9 02/12/2019 0444   ALKPHOS 32 (L) 02/12/2019 0444   BILITOT 2.0 (H) 02/12/2019 0444   GFRNONAA 27 (L) 02/12/2019 0444   GFRAA 32 (L) 02/12/2019 0444   Lipase     Component Value Date/Time   LIPASE 26 12/14/2018 0125    Studies/Results:  US Renal  Result Date: 02/10/2019 CLINICAL DATA:  Acute kidney injury EXAM: RENAL / URINARY TRACT ULTRASOUND COMPLETE COMPARISON:  CT 02/08/2019 FINDINGS: Right Kidney: Renal measurements: 11.3 x 5.4 x 6.1 cm = volume: 194 mL. Multiple small cysts, the largest 3.2 cm in the upper pole. Mildly increased echotexture and cortical thinning. No hydronephrosis. Left Kidney: Renal measurements: 10.6 x 5.2 x 5.7 cm = volume: 162 mL. Small cysts, the largest 1.8 cm in the upper pole. Mildly increased echotexture and cortical thinning. No hydronephrosis. Bladder: Foley catheter in place, decompressed. Other: Moderate ascites noted.  IMPRESSION: Mildly increased echotexture with cortical thinning compatible with chronic medical renal disease. No hydronephrosis. Moderate ascites. Electronically Signed   By: Charlett Nose M.D.   On: 02/10/2019 21:46     Jerre Simon, [facility] Surgery Please see amion for pager for the following: Horald Chestnut, & Friday 7:00am - 4:30pm Thursdays 7:00am -11:30am

## 2019-02-12 NOTE — Progress Notes (Signed)
[facility] Progress Note  Chief Complaint: Perforated gastric ulcer  History:  Very difficult to get a history or review of systems from this patient.  He is perseverating on his history of PTSD, and seems to be requesting some medication that he needs for that.  It is difficult to tell if he has abdominal pain.  He continues to pass melena and rectal collection tube, but patient's nurse says it seems to be small volume compared to a couple days ago.  His hemoglobin is gone from 7.6-8.4 today without transfusion. Upper GI series this morning shows contained distal gastric perforation without extravasation of contrast. Due to his mental status, he has been given lactulose enemas, though it is not clear he has about encephalopathy.  ROS: Unable to obtain review of systems due to the patient's mental confusion  Objective:   Current Facility-Administered Medications:  .  Place/Maintain arterial line, , , Until Discontinued **AND** 0.9 %  sodium chloride infusion, , Intra-arterial, PRN, Sherryll Burger, Pratik D, DO .  0.9 %  sodium chloride infusion, 250 mL, Intravenous, Continuous, Oretha Milch, MD, Stopped at 02/12/19 1040 .  0.9 %  sodium chloride infusion, , Intravenous, PRN, Hughie Closs, MD, Stopped at 02/12/19 0947 .  Chlorhexidine Gluconate Cloth 2 % PADS 6 each, 6 each, Topical, Daily, Oretha Milch, MD, 6 each at 02/12/19 0137 .  fentaNYL (SUBLIMAZE) injection 25-50 mcg, 25-50 mcg, Intravenous, Q1H PRN, Oretha Milch, MD, 50 mcg at 02/12/19 1354 .  haloperidol (HALDOL) tablet 2 mg, 2 mg, Oral, Q6H PRN, 2 mg at 02/11/19 2158 **OR** haloperidol lactate (HALDOL) injection 1 mg, 1 mg, Intramuscular, Q6H PRN, Icard, Bradley L, DO .  haloperidol lactate (HALDOL) injection 0.5 mg, 0.5 mg, Intravenous, BID BM & HS PRN, Warrick Parisian, [place]wo U, MD, 0.5 mg at 02/12/19 1354 .  hydrocortisone sodium succinate (SOLU-CORTEF) 100 MG injection 50 mg, 50 mg, Intravenous, Daily, Icard, Bradley L, DO, 50 mg at  02/12/19 0920 .  insulin aspart (novoLOG) injection 0-9 Units, 0-9 Units, Subcutaneous, Q4H, Shah, Pratik D, DO .  lactulose (CHRONULAC) enema 200 gm, 300 mL, Rectal, BID, Pahwani, Ravi, MD .  midodrine (PROAMATINE) tablet 10 mg, 10 mg, Oral, TID WC, Shah, Pratik D, DO, Stopped at 02/11/19 [phone] .  ondansetron (ZOFRAN) injection 4 mg, 4 mg, Intravenous, Q6H PRN, Pahwani, Rinka R, MD .  pantoprazole (PROTONIX) injection 40 mg, 40 mg, Intravenous, Q12H, Dorthea Cove, NP, 40 mg at 02/12/19 0920 .  phytonadione (VITAMIN K) 10 mg in dextrose 5 % 50 mL IVPB, 10 mg, Intravenous, Once, Delford Field, Rhonda M, NP .  piperacillin-tazobactam (ZOSYN) IVPB 3.375 g, 3.375 g, Intravenous, Q8H, Vicente Serene, RPH, Last Rate: 12.5 mL/hr at 02/12/19 1100  . sodium chloride    . sodium chloride Stopped (02/12/19 1040)  . sodium chloride Stopped (02/12/19 0947)  . phytonadione (VITAMIN K) IV    . piperacillin-tazobactam (ZOSYN)  IV 12.5 mL/hr at 02/12/19 1100     Vital signs in last 24 hrs: Vitals:   02/12/19 1200 02/12/19 1300  BP: (!) 101/56 107/71  Pulse: 70 70  Resp: (!) 22 (!) 22  Temp:    SpO2: 94% 94%    Intake/Output Summary (Last 24 hours) at 02/12/2019 1414 Last data filed at 02/12/2019 1146 Gross per 24 hour  Intake 381.84 ml  Output 1050 ml  Net -668.16 ml     Physical Exam Chronically ill-appearing man, poor muscle mass.  Flattened affect.  Repeats the same questions  and phrases.  Repeatedly asks for water and food.  HEENT: sclera anicteric, oral mucosa dry without lesions  Neck: supple, no thyromegaly, JVD or lymphadenopathy  Cardiac: RRR without murmurs, S1S2 heard, + peripheral edema  Pulm: clear to auscultation bilaterally, normal RR and effort noted  Abdomen: soft, tumor with ascites, no tenderness, with active bowel sounds.  Cannot assess hepatosplenomegaly due to abdominal girth.  No leakage from LLQ paracentesis site.  Skin; warm and dry, + mild jaundice  Recent  Labs:  CBC Latest Ref Rng & Units 02/12/2019 02/11/2019 02/10/2019  WBC 4.0 - 10.5 K/uL 3.4(L) 3.9(L) 2.9(L)  Hemoglobin 13.0 - 17.0 g/dL 8.4(L) 7.6(L) 7.4(L)  Hematocrit 39.0 - 52.0 % 25.6(L) 23.6(L) 22.7(L)  Platelets 150 - 400 K/uL 77(L) 81(L) 68(L)    Recent Labs  Lab 02/10/19 1111  INR 2.0*   CMP Latest Ref Rng & Units 02/12/2019 02/11/2019 02/10/2019  Glucose 70 - 99 mg/dL 91 83 114(H)  BUN 6 - 20 mg/dL 49(H) 48(H) 43(H)  Creatinine 0.61 - 1.24 mg/dL 2.49(H) 2.37(H) 2.34(H)  Sodium 135 - 145 mmol/L 146(H) 142 140  Potassium 3.5 - 5.1 mmol/L 3.3(L) 3.6 3.9  Chloride 98 - 111 mmol/L 113(H) 110 108  CO2 22 - 32 mmol/L 19(L) 19(L) 20(L)  Calcium 8.9 - 10.3 mg/dL 9.2 8.9 8.5(L)  Total Protein 6.5 - 8.1 g/dL 5.8(L) 5.5(L) 5.2(L)  Total Bilirubin 0.3 - 1.2 mg/dL 2.0(H) 1.6(H) 2.0(H)  Alkaline Phos 38 - 126 U/L 32(L) 31(L) 37(L)  AST 15 - 41 U/L 11(L) 15 15  ALT 0 - 44 U/L 9 9 10      Radiologic studies: Upper GI series report on file.  Noted above.  @ASSESSMENTPLANBEGIN @ Assessment: Cirrhosis with ascites, decompensated Altered mental status, difficult to tell if from mental illness or about encephalopathy. Perforated gastric ulcer, contained.  High risk surgical candidate, surgery recommending n.p.o. and TPN.   Plan: He seems to have a large volume of ascites on exam, though not tense, not compromising his breathing. Continue n.p.o. except swabs of water. He may need a paracentesis in the next few days.  We will follow.  Nelida Meuse III Office: [phone]

## 2019-02-12 NOTE — Progress Notes (Signed)
[facility] Progress Note  Subjective: UGI with focal perf in stomach, TNA recommended.  Keeps asking for popsicles.  Labs about the same.  Vitals:   02/12/19 0600 02/12/19 0800 02/12/19 0908 02/12/19 1000  BP: 103/65 (!) 100/56 112/60 (!) 99/56  Pulse: 65 69 70 67  Resp: 19 20 (!) 21 17  Temp:  98.4 F (36.9 C)    TempSrc:  Oral    SpO2: 90% 93% 94% 93%  Weight:      Height:        Inpatient medications: . Chlorhexidine Gluconate Cloth  6 each Topical Daily  . hydrocortisone sod succinate (SOLU-CORTEF) inj  50 mg Intravenous Daily  . insulin aspart  0-9 Units Subcutaneous Q4H  . lactulose  30 g Oral TID  . midodrine  10 mg Oral TID WC  . pantoprazole (PROTONIX) IV  40 mg Intravenous Q12H   . sodium chloride    . sodium chloride Stopped (02/12/19 0925)  . sodium chloride Stopped (02/12/19 0947)  . phytonadione (VITAMIN K) IV    . piperacillin-tazobactam (ZOSYN)  IV 12.5 mL/hr at 02/12/19 1000  . potassium chloride 10 mEq (02/12/19 1040)   Place/Maintain arterial line **AND** sodium chloride, sodium chloride, fentaNYL (SUBLIMAZE) injection, haloperidol **OR** haloperidol lactate, haloperidol lactate, ondansetron (ZOFRAN) IV    Exam: Gen alert, conversant, chronically ill, cachectic flat neck veins Chest clear bilat to bases RRR no MRG Abd distended w/ ++ascites, mildly tender, no mass , +bs Ext 1-2+ UE edema, 1-2+ bilat LE edema Neuro is alert, Ox 3 , nf    Home meds:  - gabapentin 200 tid/ oxycodone 10 tid prn/ haloperidol 83m bid  - lactulose 30gm bid prn to have 2-3 loose bm's per day  - pantoprazole 40 bid  - prednisone 50 qd  - sensipar 30 qd     Date                           Creat                           eGFR   08/2007- 12/2009        0.9- 1.35   12/2009- 2012           1.52- 1.99]   2013- 2017                1.81- 2.43   2019                          1.95- 2.18   07/2018- 11/2018          1.70- 2.07                    34- 43    12/2018                     2.26 >> 1.74   11/12                         1.98                             36           11/13  2.43    11/ 24/20                  2.34                             30          UA 11/14 - negative    UNa <10, Ucr 69    CT abd > Adrenals/Urinary Tract: .Marland Kitchen..renal cysts are again identified. No hydronephrosis or perinephric stranding. No ureterectasis or ureteral stones. Bladder is unremarkable.    CXR 11/13 > poor film, rotated, possible L effusion /density    Renal US 11/14 - 10- 11 cm kidneys, mild ^echo and cortical thinning, no hydro  Assessment/ Plan: 1. AKI on CKD 3b - baseline creat 1.7- 2.0.  Renal US some ckd, UA negative, UNa < 10. AKI due to HRS , less likely contrast. BP's improved w/ HRS protocol, Cr stabilized at 2.3-2.4.  Continue midodrine.  Very poor dialysis candidate overall.  Fortunately no indication.   2. Volume - up 7kg since admission, stopped IVF"s.  3. Cirrhosis/ hepatitis C - on lactulose, sig ascites 4. Perforated viscous - by CT scan on admit, seen by gen surg they felt he probably had a sealed perforated ulcer.  Medical management was suggested given his overall poor health--> UGI this AM showing contained stomach perf, TNA recommended.  5. Schizoaffective d/o 6. Hx adrenal insuff - on prednisone     Madelon Lips MD 02/12/2019, 11:14 AM  Iron/TIBC/Ferritin/ %Sat    Component Value Date/Time   IRON 15 (L) 08/07/2018 0800   TIBC 348 08/07/2018 0800   FERRITIN 3 (L) 08/07/2018 0800   IRONPCTSAT 4 (L) 08/07/2018 0800   Recent Labs  Lab 02/08/19 1709  02/10/19 1111  02/12/19 0444  NA  --    < > 140   < > 146*  K  --    < > 3.9   < > 3.3*  CL  --    < > 108   < > 113*  CO2  --    < > 20*   < > 19*  GLUCOSE  --    < > 114*   < > 91  BUN  --    < > 43*   < > 49*  CREATININE  --    < > 2.34*   < > 2.49*  CALCIUM  --    < > 8.5*   < > 9.2  PHOS 3.8  --   --   --   --   ALBUMIN  --    < > 3.1*   <  > 3.7  INR  --    < > 2.0*  --   --    < > = values in this interval not displayed.   Recent Labs  Lab 02/12/19 0444  AST 11*  ALT 9  ALKPHOS 32*  BILITOT 2.0*  PROT 5.8*   Recent Labs  Lab 02/12/19 0444  WBC 3.4*  HGB 8.4*  HCT 25.6*  PLT 77*

## 2019-02-13 ENCOUNTER — Encounter (HOSPITAL_COMMUNITY): Payer: Self-pay | Admitting: General Practice

## 2019-02-13 DIAGNOSIS — F4310 Post-traumatic stress disorder, unspecified: Secondary | ICD-10-CM

## 2019-02-13 DIAGNOSIS — R188 Other ascites: Secondary | ICD-10-CM | POA: Diagnosis not present

## 2019-02-13 DIAGNOSIS — D62 Acute posthemorrhagic anemia: Secondary | ICD-10-CM | POA: Diagnosis not present

## 2019-02-13 DIAGNOSIS — K7460 Unspecified cirrhosis of liver: Secondary | ICD-10-CM

## 2019-02-13 DIAGNOSIS — K631 Perforation of intestine (nontraumatic): Secondary | ICD-10-CM | POA: Diagnosis not present

## 2019-02-13 DIAGNOSIS — K265 Chronic or unspecified duodenal ulcer with perforation: Secondary | ICD-10-CM

## 2019-02-13 DIAGNOSIS — Z515 Encounter for palliative care: Secondary | ICD-10-CM

## 2019-02-13 DIAGNOSIS — N179 Acute kidney failure, unspecified: Secondary | ICD-10-CM

## 2019-02-13 DIAGNOSIS — K921 Melena: Secondary | ICD-10-CM | POA: Diagnosis not present

## 2019-02-13 DIAGNOSIS — F251 Schizoaffective disorder, depressive type: Secondary | ICD-10-CM

## 2019-02-13 HISTORY — DX: Chronic or unspecified duodenal ulcer with perforation: K26.5

## 2019-02-13 LAB — CULTURE, BLOOD (ROUTINE X 2)
Culture: NO GROWTH
Culture: NO GROWTH

## 2019-02-13 LAB — CBC WITH DIFFERENTIAL/PLATELET
Abs Immature Granulocytes: 0.03 10*3/uL (ref 0.00–0.07)
Basophils Absolute: 0 10*3/uL (ref 0.0–0.1)
Basophils Relative: 0 %
Eosinophils Absolute: 0 10*3/uL (ref 0.0–0.5)
Eosinophils Relative: 0 %
HCT: 24.5 % — ABNORMAL LOW (ref 39.0–52.0)
Hemoglobin: 8 g/dL — ABNORMAL LOW (ref 13.0–17.0)
Immature Granulocytes: 1 %
Lymphocytes Relative: 8 %
Lymphs Abs: 0.3 10*3/uL — ABNORMAL LOW (ref 0.7–4.0)
MCH: 30.3 pg (ref 26.0–34.0)
MCHC: 32.7 g/dL (ref 30.0–36.0)
MCV: 92.8 fL (ref 80.0–100.0)
Monocytes Absolute: 0.3 10*3/uL (ref 0.1–1.0)
Monocytes Relative: 7 %
Neutro Abs: 2.9 10*3/uL (ref 1.7–7.7)
Neutrophils Relative %: 84 %
Platelets: 74 10*3/uL — ABNORMAL LOW (ref 150–400)
RBC: 2.64 MIL/uL — ABNORMAL LOW (ref 4.22–5.81)
RDW: 18.7 % — ABNORMAL HIGH (ref 11.5–15.5)
WBC: 3.5 10*3/uL — ABNORMAL LOW (ref 4.0–10.5)
nRBC: 0 % (ref 0.0–0.2)

## 2019-02-13 LAB — GLUCOSE, CAPILLARY
Glucose-Capillary: 102 mg/dL — ABNORMAL HIGH (ref 70–99)
Glucose-Capillary: 89 mg/dL (ref 70–99)
Glucose-Capillary: 91 mg/dL (ref 70–99)
Glucose-Capillary: 87 mg/dL (ref 70–99)
Glucose-Capillary: 84 mg/dL (ref 70–99)

## 2019-02-13 LAB — BASIC METABOLIC PANEL
Anion gap: 15 (ref 5–15)
BUN: 55 mg/dL — ABNORMAL HIGH (ref 6–20)
CO2: 19 mmol/L — ABNORMAL LOW (ref 22–32)
Calcium: 9.3 mg/dL (ref 8.9–10.3)
Chloride: 112 mmol/L — ABNORMAL HIGH (ref 98–111)
Creatinine, Ser: 2.59 mg/dL — ABNORMAL HIGH (ref 0.61–1.24)
GFR calc Af Amer: 30 mL/min — ABNORMAL LOW (ref >60)
GFR calc non Af Amer: 26 mL/min — ABNORMAL LOW (ref >60)
Glucose, Bld: 97 mg/dL (ref 70–99)
Potassium: 3.4 mmol/L — ABNORMAL LOW (ref 3.5–5.1)
Sodium: 146 mmol/L — ABNORMAL HIGH (ref 135–145)

## 2019-02-13 LAB — PH, BODY FLUID: pH, Body Fluid: 7.3

## 2019-02-13 LAB — BODY FLUID CULTURE: Culture: NO GROWTH

## 2019-02-13 LAB — PATHOLOGIST SMEAR REVIEW

## 2019-02-13 MED ORDER — SODIUM CHLORIDE 0.9 % IV SOLN
8.0000 mg/h | INTRAVENOUS | Status: AC
  Administered 2019-02-13 – 2019-02-15 (×5): 8 mg/h via INTRAVENOUS
  Filled 2019-02-13 (×6): qty 80

## 2019-02-13 MED ORDER — VITAMIN K1 10 MG/ML IJ SOLN
5.0000 mg | Freq: Every day | INTRAVENOUS | Status: AC
  Administered 2019-02-13 – 2019-02-15 (×3): 5 mg via INTRAVENOUS
  Filled 2019-02-13 (×6): qty 0.5

## 2019-02-13 MED ORDER — ALBUMIN HUMAN 25 % IV SOLN
25.0000 g | Freq: Four times a day (QID) | INTRAVENOUS | Status: AC
  Filled 2019-02-13 (×2): qty 100

## 2019-02-13 MED ORDER — POTASSIUM CHLORIDE 10 MEQ/100ML IV SOLN
10.0000 meq | INTRAVENOUS | Status: AC
  Administered 2019-02-13 (×5): 10 meq via INTRAVENOUS
  Filled 2019-02-13 (×5): qty 100

## 2019-02-13 MED ORDER — SODIUM CHLORIDE 0.9% FLUSH: 10.0000 mL | INTRAVENOUS | Status: DC | PRN

## 2019-02-13 NOTE — Progress Notes (Addendum)
Daily Rounding Note  02/13/2019, 10:35 AM  LOS: 5 days   SUBJECTIVE:   Chief complaint: Perforated gastric ulcer.  Melenic, burgundy stool.  Anemia. Continues to pass burgundy, thick liquid stools.  C/o thirst and repeated request for swabs of water.  Belly feels tight, not painful.  No n/v.     OBJECTIVE:         Vital signs in last 24 hours:    Temp:  [98.2 F (36.8 C)-98.4 F (36.9 C)] 98.2 F (36.8 C) (11/17 0454) Pulse Rate:  [68-77] 70 (11/17 0454) Resp:  [13-27] 18 (11/17 0454) BP: (100-115)/(56-73) 115/73 (11/17 0454) SpO2:  [93 %-100 %] 97 % (11/17 0454) Last BM Date: 02/10/19 Filed Weights   02/11/19 0500 02/11/19 0501 02/12/19 0136  Weight: 109.9 kg 109.9 kg 109.6 kg   General: pale, ill,  malnourished looking   Heart: RRR Chest: no labored breathing or cough.  Lungs clear bil Abdomen: distended, moderately tense w umbilical hernia.  Minor RUQ tenderness. Rectal: Pt expelled the flexi-seal catheter containing burgundy stool, same stool soiling bed linens.      Extremities: 1 to 2 plus LE edema  Neuro/Psych:  Alert, following train of thought but perseverating on swabs of water.    Intake/Output from previous day: 11/16 0701 - 11/17 0700 In: 366.7 [I.V.:18.4; IV Piggyback:348.3] Out: 855 [Urine:855]  Intake/Output this shift: Total I/O In: -  Out: 150 [Urine:150]  Lab Results: Recent Labs    02/11/19 0345 02/12/19 0444 02/13/19 0552  WBC 3.9* 3.4* 3.5*  HGB 7.6* 8.4* 8.0*  HCT 23.6* 25.6* 24.5*  PLT 81* 77* 74*   BMET Recent Labs    02/11/19 0345 02/12/19 0444 02/13/19 0552  NA 142 146* 146*  K 3.6 3.3* 3.4*  CL 110 113* 112*  CO2 19* 19* 19*  GLUCOSE 83 91 97  BUN 48* 49* 55*  CREATININE 2.37* 2.49* 2.59*  CALCIUM 8.9 9.2 9.3   LFT Recent Labs    02/10/19 1111 02/11/19 0345 02/12/19 0444  PROT 5.2* 5.5* 5.8*  ALBUMIN 3.1* 3.8 3.7  AST 15 15 11*  ALT 10 9 9   ALKPHOS  37* 31* 32*  BILITOT 2.0* 1.6* 2.0*   PT/INR Recent Labs    02/10/19 1111  LABPROT 22.1*  INR 2.0*   Hepatitis Panel No results for input(s): HEPBSAG, HCVAB, HEPAIGM, HEPBIGM in the last 72 hours.  Studies/Results: [name]  Addendum Date: 02/12/2019   ADDENDUM REPORT: 02/12/2019 09:24 ADDENDUM: Critical Value/emergent results were called by telephone at the time of interpretation on 02/12/2019 at 9:20 am to [name], [place] for [name], who verbally acknowledged these results. Electronically Signed   By: Richardean Sale M.D.   On: 02/12/2019 09:24   Result Date: 02/12/2019 CLINICAL DATA:  GI bleed with free intraperitoneal air on abdominal CT. EXAM: UPPER GI SERIES WITH KUB TECHNIQUE: After obtaining a scout radiograph a routine upper GI series was performed using water-soluble contrast (150 cc Omnipaque 300 orally). FLUOROSCOPY TIME:  Fluoroscopy Time: 1 minutes and 0 seconds of low-dose pulsed fluoroscopy. Radiation Exposure Index (if provided by the fluoroscopic device): 1 scout image. Five spot images. Number of Acquired Spot Images: 44.2 mGy COMPARISON:  Abdominopelvic CT 02/08/2019. FINDINGS: The scout abdominal radiograph demonstrates a normal bowel gas pattern and no obvious free intraperitoneal air. Mild lumbar spine degenerative changes are present. The fluoroscopic study is somewhat limited by  the patient's limited mobility. The patient swallowed the contrast without difficulty. The esophagus demonstrates mild tertiary contractions without stricture or focal mucosal lesion. There is mild extrinsic compression on the distal esophagus by the patient's known esophageal varices. There is a focal contained perforation involving the distal stomach or proximal duodenum, likely pre-pyloric based on the oblique views and recent CT. No free extravasation of the contrast was demonstrated. The remainder of the stomach and proximal duodenum appear  normal without obstruction. IMPRESSION: 1. Focal contained perforation in the distal stomach, likely accounting for the free intraperitoneal air on recent CT. No free extravasation of contrast or upper GI obstruction. 2. Mild esophageal tertiary contractions.  Known esophageal varices. Electronically Signed: By: [name]  [name] On: 02/12/2019 09:14   [place] Ekg Site Rite  Result Date: 02/12/2019 If Site Rite image not attached, placement could not be confirmed due to current cardiac rhythm.  Scheduled Meds: . Chlorhexidine Gluconate Cloth  6 each Topical Daily  . insulin aspart  0-9 Units Subcutaneous Q4H  . lactulose  300 mL Rectal BID  . midodrine  10 mg Oral TID WC  . pantoprazole (PROTONIX) IV  40 mg Intravenous Q12H   Continuous Infusions: . sodium chloride    . sodium chloride 10 mL/hr at 02/12/19 1300  . sodium chloride Stopped (02/12/19 0947)  . phytonadione (VITAMIN K) IV    . piperacillin-tazobactam (ZOSYN)  IV 3.375 g (02/13/19 1001)  . potassium chloride 10 mEq (02/13/19 0954)   PRN Meds:.Place/Maintain arterial line **AND** sodium chloride, sodium chloride, fentaNYL (SUBLIMAZE) injection, haloperidol **OR** haloperidol lactate, haloperidol lactate, ondansetron (ZOFRAN) IV, sodium chloride flush   ASSESMENT:   *     Perforated gastric ulcer.  Contained.  Medical management with n.p.o.  Day 6 Zosyn.  TPN.  Non-op candidate due to decompensated liver disease.   01/10/2019 EGD for hematemesis.  Grade D, severe esophagitis, 2 cm HH.  Retained food residual in stomach.  Large, nonbleeding gastric antral ulcer, biopsied.  Nonbleeding duodenal ulcers.  No gastric or esophageal varices   *      Melenic, burgundy stool.  *      Cirrhosis of the liver, decompensated.  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  *   Ascites.  SBP on 02/09/2019 paracentesis [facility]([name] ordered).  No SBP prophy abx at home.   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  *   Coagulopathy.  Never received Vit K ordered on 11/13.    *    Severe PTSD.  Numerous episodes of psychosis. Chronic mental health issues.   AMS.  Currently low suspicion for HE.  Ammonia  72 >> 32 four d ago.   On bid lactulose enemas.   PLAN   *   Paracentesis, ? Timing.  R/o ongoing SBP and provide relief distention.  *    ?  When to repeat imaging of ulcer?    *   Stop lactulose enemas.  *   Vitamin K 5 mg IV/day x 3 days.       Benjamin MoccasinSarah Gribbin  02/13/2019, 10:35 AM Phone [phone]  I have discussed the case with the PA, and that is the plan I formulated. I personally interviewed and examined the patient.  CC: Perforated gastric ulcer with bleeding  This continues to be very ill with a complex scenario of end-stage decompensated liver disease and sealed perforation of gastric ulcer.  Unfortunately, the ulcer continues to be causing overt  bleeding and slow decline in hemoglobin since recent transfusion.  I do not think there is any interventional radiology solution to this.  Current treatment plan has not stopped the bleeding, though we will change him back to Protonix drip for what good that may do.   While I agree entirely that this patient has an expected high mortality from any surgical intervention, if he continues to bleed despite these efforts, then the decision will have to be made either offer him a very high risk surgery or move him to comfort care.  All of that is of course complicated by his underlying mental illness, poor social support and possibly an element of language barrier (see November 16 palliative care note).  I believe his mental status is related to his underlying psychiatric disease and not hepatic encephalopathy, so we have discontinued his lactulose.  Planning for therapeutic paracentesis tomorrow so he does not develop respiratory distress, but that is a temporizing measure at best.  TPN is being continued in the meantime.   Total time 35 minutes [name] Office: [phone]

## 2019-02-13 NOTE — Progress Notes (Signed)
[facility]/Trauma Progress Note      Assessment/Plan Schizoaffective disorder Chronic pain [name] Hepatitis C CKDw/ AKI - Cr 2.43 ABL anemia - Hgb7.2  Small amount pneumoperitoneum GI bleed - Patient with known gastric and duodenal ulcers, small amount of free air on CT scan.I suspect he has a sealed perforated duodenal ulcer. Given his poor overall health, recommendcontinuingmedical management for nowwith bowel rest and IV abx. He has a high mortality with any surgical intervention given his severe liver disease. - S/Pparacentesis11/13, cultures pendingbut NGTD, fungal culture also pending -UGIshowed focal contained perforation in the distal stomach - continue bowel rest, PPI, TPN  ID -Zosyn VTE -SCDs FEN -IVF,strictNPO, TPN Foley -none Follow up -TBD   LOS: 5 days    Subjective: CC: mild abdominal pain  Pt denies nausea or vomiting. I explained why he cannot eat. He told me he wants to go home.   Objective: Vital signs in last 24 hours: Temp:  [98.2 F (36.8 C)-98.4 F (36.9 C)] 98.2 F (36.8 C) (11/17 0454) Pulse Rate:  [67-77] 70 (11/17 0454) Resp:  [13-27] 18 (11/17 0454) BP: (99-115)/(56-73) 115/73 (11/17 0454) SpO2:  [93 %-100 %] 97 % (11/17 0454) Last BM Date: 02/10/19  Intake/Output from previous day: 11/16 0701 - 11/17 0700 In: 366.7 [I.V.:18.4; IV Piggyback:348.3] Out: 855 [Urine:855] Intake/Output this shift: No intake/output data recorded.  PE:  Gen: Alert, NAD, pleasant, appears older than stated age, pale Pulm:Rate andeffort normal Abd: Soft,moderate distention,no peritonitis, mildTTP RUQ/epigastriumwithout guarding Skin:warm and dry   Anti-infectives: Anti-infectives (From admission, onward)   Start     Dose/Rate Route Frequency Ordered Stop   02/08/19 2200  piperacillin-tazobactam (ZOSYN) IVPB 3.375 g     3.375 g 12.5 mL/hr over 240 Minutes Intravenous Every 8 hours 02/08/19 1552      02/08/19 1545  piperacillin-tazobactam (ZOSYN) IVPB 3.375 g     3.375 g 100 mL/hr over 30 Minutes Intravenous  Once 02/08/19 1533 02/08/19 1840      Lab Results:  Recent Labs    02/12/19 0444 02/13/19 0552  WBC 3.4* 3.5*  HGB 8.4* 8.0*  HCT 25.6* 24.5*  PLT 77* 74*   BMET Recent Labs    02/12/19 0444 02/13/19 0552  NA 146* 146*  K 3.3* 3.4*  CL 113* 112*  CO2 19* 19*  GLUCOSE 91 97  BUN 49* 55*  CREATININE 2.49* 2.59*  CALCIUM 9.2 9.3   PT/INR Recent Labs    02/10/19 1111  LABPROT 22.1*  INR 2.0*   CMP     Component Value Date/Time   NA 146 (H) 02/13/2019 0552   K 3.4 (L) 02/13/2019 0552   CL 112 (H) 02/13/2019 0552   CO2 19 (L) 02/13/2019 0552   GLUCOSE 97 02/13/2019 0552   BUN 55 (H) 02/13/2019 0552   CREATININE 2.59 (H) 02/13/2019 0552   CALCIUM 9.3 02/13/2019 0552   CALCIUM 8.5 09/30/2010 0437   PROT 5.8 (L) 02/12/2019 0444   PROT 5.4 (L) 11/02/2017 1147   ALBUMIN 3.7 02/12/2019 0444   AST 11 (L) 02/12/2019 0444   ALT 9 02/12/2019 0444   ALKPHOS 32 (L) 02/12/2019 0444   BILITOT 2.0 (H) 02/12/2019 0444   GFRNONAA 26 (L) 02/13/2019 0552   GFRAA 30 (L) 02/13/2019 0552   Lipase     Component Value Date/Time   LIPASE 26 12/14/2018 0125    Studies/Results: Dg Kayleen Memos W Single Cm (sol Or Thin Ba)  Addendum Date: 02/12/2019   ADDENDUM REPORT: 02/12/2019 09:24  ADDENDUM: Critical Value/emergent results were called by telephone at the time of interpretation on 02/12/2019 at 9:20 am to [place], [place] for [name], who verbally acknowledged these results. Electronically Signed   By: Richardean Sale M.D.   On: 02/12/2019 09:24   Result Date: 02/12/2019 CLINICAL DATA:  GI bleed with free intraperitoneal air on abdominal CT. EXAM: UPPER GI SERIES WITH KUB TECHNIQUE: After obtaining a scout radiograph a routine upper GI series was performed using water-soluble contrast (150 cc Omnipaque 300 orally). FLUOROSCOPY TIME:  Fluoroscopy Time:  1 minutes and 0 seconds of low-dose pulsed fluoroscopy. Radiation Exposure Index (if provided by the fluoroscopic device): 1 scout image. Five spot images. Number of Acquired Spot Images: 44.2 mGy COMPARISON:  Abdominopelvic CT 02/08/2019. FINDINGS: The scout abdominal radiograph demonstrates a normal bowel gas pattern and no obvious free intraperitoneal air. Mild lumbar spine degenerative changes are present. The fluoroscopic study is somewhat limited by the patient's limited mobility. The patient swallowed the contrast without difficulty. The esophagus demonstrates mild tertiary contractions without stricture or focal mucosal lesion. There is mild extrinsic compression on the distal esophagus by the patient's known esophageal varices. There is a focal contained perforation involving the distal stomach or proximal duodenum, likely pre-pyloric based on the oblique views and recent CT. No free extravasation of the contrast was demonstrated. The remainder of the stomach and proximal duodenum appear normal without obstruction. IMPRESSION: 1. Focal contained perforation in the distal stomach, likely accounting for the free intraperitoneal air on recent CT. No free extravasation of contrast or upper GI obstruction. 2. Mild esophageal tertiary contractions.  Known esophageal varices. Electronically Signed: By: Richardean Sale M.D. On: 02/12/2019 09:14   [place] Ekg Site Rite  Result Date: 02/12/2019 If Site Rite image not attached, placement could not be confirmed due to current cardiac rhythm.    Kalman Drape, PA-C [facility] Please see amion for pager for the following: Myna Hidalgo, W, & Friday 7:00am - 4:30pm Thursdays 7:00am -11:30am

## 2019-02-13 NOTE — Progress Notes (Signed)
PROGRESS NOTE    [name]  RUE:[id number] DOB: 08/02/1960 DOA: 02/08/2019 PCP: Fleet ContrasAvbuere, Edwin, MD   Brief Narrative:  58 y.o.malewith medical history significant ofcirrhosis with splenomegaly, esophageal varices, hepatitis C, adrenal insufficiency, hypothyroidism, schizoaffective disorder, PTSD, GI bleed, neuropathy, chronic kidney disease presented to emergency department due to worsening abdominal pain, distention associated with rectal bleeding for 1 day.  Work-up revealed bowel perforation/pneumoperitoneum.  Surgery consulted and recommended no operative intervention due to high mortality risk, antibiotics and bowel rest.  GI was also consulted for possible paracentesis due to significant ascites as well as dark maroon stools rectal and acute blood loss anemia.  Protonix infusion was initiated as well as octreotide as patient has history of esophageal varices.  He was admitted to stepdown status.  He remained in ER while awaiting bed.  He continued to have borderline blood pressure, progressing to hypotension.  PCCM consulted for management.  Patient was transferred back to [facility] on 02/12/2019.  Patient underwent UGI on 02/12/2019 per general surgery and he was found to have focal contained perforation in the distal stomach.  General surgery and GI recommends keeping n.p.o. with no surgical intervention due to poor surgical candidate.  Of note: EGD from 01/10/2019 which showed acute esophagitis and nonbleeding large gastric ulcer in antrum. Has been treated for Hep C in 2019 at [facility].  [facility].  Assessment & Plan:   Principal Problem:   Bowel perforation (HCC) Active Problems:   Schizoaffective disorder (HCC)   History of adrenal insufficiency   GI bleed   Cirrhosis (HCC)   Esophageal varices in cirrhosis (HCC)   CKD (chronic kidney disease) stage 3, GFR 30-59 ml/min   Abdominal pain   Bleeding per rectum   Septic shock (HCC)  Central venous catheter in place   Goals of care, counseling/discussion   Palliative care by specialist   AKI (acute kidney injury) (HCC)  Septic shock due to bowel perforation/pneumoperitoneum/gastric perforation: Evaluated by [facility] and deemed to not be a surgical candidate given high mortality. Recent endoscopy with no esophageal varices.  He was on vasopressors at one point in time in ICU.  Has been off of vasopressors since 02/10/2019.  Received couple of doses of albumin.  He is on midodrine.  Continue Zosyn.  Upper GI series showed gastric perforation with contained pneumoperitoneum.  General surgery once again does not recommend surgical intervention.  He is n.p.o. consulted dietitian to start TPN on 02/12/2019.  Hx adrenal insufficiency (on 50mg  prednisone daily for some reason).  He is on same dose here.  Cirrhosis with ascites - MELD 23, Child Pugh C. Hx esophageal varices, HCV  (treated with Epclusa in 2018, followed [facility] liver [facility] at [facility]). -His cirrhosis is a significant comorbidity Palliative care consulted.  Anemia with GIB - Due to known gastric & duodenal ulcers (had EGD Oct 2020 which showed grade D esophagitis, 2cm hiatal hernia, 5cm non-bleeding gastric ulcer and non-bleeding duodenal ulcers). -Hemoglobin has remained over 7 since admission.  Transfuse for hemoglobin less than 7 -Continue PPI twice daily  Acute renal failure in the setting of CKD stage III: Baseline creatinine 1.7-2.0.  Currently stable around 2.6.  Renal ultrasound shows CKD.  UA negative.  Nephrology on board and management per them.  Hypokalemia: 3.4.  Replace through IV.  Recheck in the morning.  Hx depression, PTSD, schizoaffective disorder, chronic pain. -Pain control with fentanyl -As needed Haldol, scheduled p.o. Haldol once he is able to  take p.o. again. Patient expressed suicidal ideations to nurse yesterday.  Psychiatry was consulted.  He tells me today that he lied so he can get  his prior home medications resumed.  He is unreliable so I will continue psychiatry consultation and they can help with suggesting medications now that he is n.p.o. and he can be only given IV medications.  DVT prophylaxis: SCD Code Status: Full code Family Communication:  None present at bedside.  Disposition Plan: To be determined  Estimated body mass index is 27.92 kg/m as calculated from the following:   Height as of this encounter: 6\' 6"  (1.981 m).   Weight as of this encounter: 109.6 kg.      Nutritional status:               Consultants:   GI, general surgery, nephrology, palliative care  Procedures:   None  Antimicrobials:   IV Zosyn   Subjective: Patient seen and examined.  He has no complaints.  Denied any abdominal pain or chest pain or shortness of breath.  Objective: Vitals:   02/12/19 1528 02/12/19 1600 02/12/19 1957 02/13/19 0454  BP:  (!) 104/57 104/66 115/73  Pulse:  70 68 70  Resp:  17 20 18   Temp: 98.3 F (36.8 C)  98.4 F (36.9 C) 98.2 F (36.8 C)  TempSrc: Oral  Oral Oral  SpO2:  93% 100% 97%  Weight:      Height:        Intake/Output Summary (Last 24 hours) at 02/13/2019 0950 Last data filed at 02/13/2019 0700 Gross per 24 hour  Intake 358.13 ml  Output 725 ml  Net -366.87 ml   Filed Weights   02/11/19 0500 02/11/19 0501 02/12/19 0136  Weight: 109.9 kg 109.9 kg 109.6 kg    Examination:  General exam: Appears calm and comfortable  Respiratory system: Clear to auscultation. Respiratory effort normal. Cardiovascular system: S1 & S2 heard, RRR. No JVD, murmurs, rubs, gallops or clicks. No pedal edema. Gastrointestinal system: Abdomen is soft, mildly distended but nontender. No organomegaly or masses felt. Normal bowel sounds heard. Central nervous system: Alert and oriented. No focal neurological deficits. Extremities: Symmetric 5 x 5 power. Skin: No rashes, lesions or ulcers.  Psychiatry: Judgement and insight appear  poor..    Data Reviewed: I have personally reviewed following labs and imaging studies  CBC: Recent Labs  Lab 02/08/19 1149  02/09/19 1611 02/10/19 1111 02/11/19 0345 02/12/19 0444 02/13/19 0552  WBC 6.1   < > 5.0 2.9* 3.9* 3.4* 3.5*  NEUTROABS 4.1  --   --   --   --   --  2.9  HGB 8.5*   < > 7.2* 7.4* 7.6* 8.4* 8.0*  HCT 28.4*   < > 23.3* 22.7* 23.6* 25.6* 24.5*  MCV 99.6   < > 97.1 92.7 92.5 92.1 92.8  PLT 159   < > 71*   69* 68* 81* 77* 74*   < > = values in this interval not displayed.   Basic Metabolic Panel: Recent Labs  Lab 02/08/19 1709 02/09/19 0317 02/10/19 1111 02/11/19 0345 02/12/19 0444 02/13/19 0552  NA  --  139 140 142 146* 146*  K  --  4.5 3.9 3.6 3.3* 3.4*  CL  --  111 108 110 113* 112*  CO2  --  19* 20* 19* 19* 19*  GLUCOSE  --  73 114* 83 91 97  BUN  --  35* 43* 48* 49* 55*  CREATININE  --  2.43* 2.34* 2.37* 2.49* 2.59*  CALCIUM  --  7.8* 8.5* 8.9 9.2 9.3  MG 1.8  --   --   --   --   --   PHOS 3.8  --   --   --   --   --    GFR: Estimated Creatinine Clearance: 40.2 mL/min (A) (by C-G formula based on SCr of 2.59 mg/dL (H)). Liver Function Tests: Recent Labs  Lab 02/08/19 1149 02/09/19 0317 02/10/19 1111 02/11/19 0345 02/12/19 0444  AST 15 16 15 15  11*  ALT 8 10 10 9 9   ALKPHOS 59 57 37* 31* 32*  BILITOT 0.9 1.3* 2.0* 1.6* 2.0*  PROT 4.7* 4.3* 5.2* 5.5* 5.8*  ALBUMIN 2.2* 1.9* 3.1* 3.8 3.7   No results for input(s): LIPASE, AMYLASE in the last 168 hours. Recent Labs  Lab 02/08/19 1709 02/10/19 1111  AMMONIA 72* 32   Coagulation Profile: Recent Labs  Lab 02/08/19 1522 02/09/19 0317 02/09/19 1611 02/10/19 1111  INR 1.6* 1.8* 2.3* 2.0*   Cardiac Enzymes: No results for input(s): CKTOTAL, CKMB, CKMBINDEX, TROPONINI in the last 168 hours. BNP (last 3 results) No results for input(s): PROBNP in the last 8760 hours. HbA1C: No results for input(s): HGBA1C in the last 72 hours. CBG: Recent Labs  Lab 02/12/19 1105  02/12/19 1501 02/12/19 2007 02/12/19 2303 02/13/19 0422  GLUCAP 76 78 78 91 102*   Lipid Profile: No results for input(s): CHOL, HDL, LDLCALC, TRIG, CHOLHDL, LDLDIRECT in the last 72 hours. Thyroid Function Tests: No results for input(s): TSH, T4TOTAL, FREET4, T3FREE, THYROIDAB in the last 72 hours. Anemia Panel: No results for input(s): VITAMINB12, FOLATE, FERRITIN, TIBC, IRON, RETICCTPCT in the last 72 hours. Sepsis Labs: Recent Labs  Lab 02/09/19 1405 02/10/19 1111  LATICACIDVEN 3.3* 1.6    Recent Results (from the past 240 hour(s))  SARS CORONAVIRUS 2 (TAT 6-24 HRS) Nasopharyngeal Nasopharyngeal Swab     Status: None   Collection Time: 02/08/19  3:20 PM   Specimen: Nasopharyngeal Swab  Result Value Ref Range Status   SARS Coronavirus 2 NEGATIVE NEGATIVE Final    Comment: (NOTE) SARS-CoV-2 target nucleic acids are NOT DETECTED. The SARS-CoV-2 RNA is generally detectable in upper and lower respiratory specimens during the acute phase of infection. Negative results do not preclude SARS-CoV-2 infection, do not rule out co-infections with other pathogens, and should not be used as the sole basis for treatment or other patient management decisions. Negative results must be combined with clinical observations, patient history, and epidemiological information. The expected result is Negative. Fact Sheet for Patients: SugarRoll.be Fact Sheet for Healthcare Providers: https://www.woods-mathews.com/ This test is not yet approved or cleared by the [place] FDA and  has been authorized for detection and/or diagnosis of SARS-CoV-2 by FDA under an Emergency Use Authorization (EUA). This EUA will remain  in effect (meaning this test can be used) for the duration of the COVID-19 declaration under Section 56 4(b)(1) of the Act, 21 U.S.C. section 360bbb-3(b)(1), unless the authorization is terminated or revoked sooner. Performed at [facility], [place] [address], [place], [place] [address]   Blood culture (routine x 2)     Status: None (Preliminary result)   Collection Time: 02/08/19  5:08 PM   Specimen: BLOOD RIGHT WRIST  Result Value Ref Range Status   Specimen Description BLOOD RIGHT WRIST  Final   Special Requests   Final    AEROBIC BOTTLE ONLY Blood Culture results may not be optimal  due to an inadequate volume of blood received in culture bottles   Culture   Final    NO GROWTH 4 DAYS Performed at [facility], [address], [place], [place] [address]    Report Status PENDING  Incomplete  Blood culture (routine x 2)     Status: None (Preliminary result)   Collection Time: 02/08/19  5:10 PM   Specimen: BLOOD RIGHT HAND  Result Value Ref Range Status   Specimen Description BLOOD RIGHT HAND  Final   Special Requests   Final    BOTTLES DRAWN AEROBIC AND ANAEROBIC Blood Culture results may not be optimal due to an inadequate volume of blood received in culture bottles   Culture   Final    NO GROWTH 4 DAYS Performed at [facility], [address] [place], [place] [address]    Report Status PENDING  Incomplete  MRSA PCR Screening     Status: None   Collection Time: 02/09/19  3:30 PM   Specimen: Nasopharyngeal  Result Value Ref Range Status   MRSA by PCR NEGATIVE NEGATIVE Final    Comment:        The GeneXpert MRSA Assay (FDA approved for NASAL specimens only), is one component of a comprehensive MRSA colonization surveillance program. It is not intended to diagnose MRSA infection nor to guide or monitor treatment for MRSA infections. Performed at [facility], [address], [place], [place] [address]   Body fluid culture (includes gram stain)     Status: None (Preliminary result)   Collection Time: 02/09/19  5:30 PM   Specimen: Pleural Fluid  Result Value Ref Range Status   Specimen Description FLUID  Final   Special Requests NONE  Final   Gram Stain   Final    FEW WBC  PRESENT, PREDOMINANTLY PMN NO ORGANISMS SEEN    Culture   Final    NO GROWTH 3 DAYS Performed at [facility], [address], [place], [place] [address]    Report Status PENDING  Incomplete      Radiology Studies: Dg Ugi W Single Cm (sol Or Thin Ba)  Addendum Date: 02/12/2019   ADDENDUM REPORT: 02/12/2019 09:24 ADDENDUM: Critical Value/emergent results were called by telephone at the time of interpretation on 02/12/2019 at 9:20 am to providerJESSICA [facility], [place] for Dr Abigail Miyamoto, who verbally acknowledged these results. Electronically Signed   By: Carey Bullocks M.D.   On: 02/12/2019 09:24   Result Date: 02/12/2019 CLINICAL DATA:  GI bleed with free intraperitoneal air on abdominal CT. EXAM: UPPER GI SERIES WITH KUB TECHNIQUE: After obtaining a scout radiograph a routine upper GI series was performed using water-soluble contrast (150 cc Omnipaque 300 orally). FLUOROSCOPY TIME:  Fluoroscopy Time: 1 minutes and 0 seconds of low-dose pulsed fluoroscopy. Radiation Exposure Index (if provided by the fluoroscopic device): 1 scout image. Five spot images. Number of Acquired Spot Images: 44.2 mGy COMPARISON:  Abdominopelvic CT 02/08/2019. FINDINGS: The scout abdominal radiograph demonstrates a normal bowel gas pattern and no obvious free intraperitoneal air. Mild lumbar spine degenerative changes are present. The fluoroscopic study is somewhat limited by the patient's limited mobility. The patient swallowed the contrast without difficulty. The esophagus demonstrates mild tertiary contractions without stricture or focal mucosal lesion. There is mild extrinsic compression on the distal esophagus by the patient's known esophageal varices. There is a focal contained perforation involving the distal stomach or proximal duodenum, likely pre-pyloric based on the oblique views  and recent CT. No free extravasation of the contrast was demonstrated. The remainder of the stomach and proximal duodenum  appear normal without obstruction. IMPRESSION: 1. Focal contained perforation in the distal stomach, likely accounting for the free intraperitoneal air on recent CT. No free extravasation of contrast or upper GI obstruction. 2. Mild esophageal tertiary contractions.  Known esophageal varices. Electronically Signed: By: [name]  [name] On: 02/12/2019 09:14   [place] Ekg Site Rite  Result Date: 02/12/2019 If Site Rite image not attached, placement could not be confirmed due to current cardiac rhythm.   Scheduled Meds:  Chlorhexidine Gluconate Cloth  6 each Topical Daily   insulin aspart  0-9 Units Subcutaneous Q4H   lactulose  300 mL Rectal BID   midodrine  10 mg Oral TID WC   pantoprazole (PROTONIX) IV  40 mg Intravenous Q12H   Continuous Infusions:  sodium chloride     sodium chloride 10 mL/hr at 02/12/19 1300   sodium chloride Stopped (02/12/19 0947)   phytonadione (VITAMIN K) IV     piperacillin-tazobactam (ZOSYN)  IV 3.375 g (02/13/19 0220)   potassium chloride 10 mEq (02/13/19 0853)     LOS: 5 days   Time spent: 30 minutes   Hughie Clossavi Twyla Dais, MD [facility]  02/13/2019, 9:50 AM   To contact the attending provider between 7A-7P or the covering provider during after hours 7P-7A, please log into the web site [email or web address] and use password [email or web address].

## 2019-02-13 NOTE — Progress Notes (Signed)
Peripherally Inserted Central Catheter/Midline Placement  The IV Nurse has discussed with the patient and/or persons authorized to consent for the patient, the purpose of this procedure and the potential benefits and risks involved with this procedure.  The benefits include less needle sticks, lab draws from the catheter, and the patient may be discharged home with the catheter. Risks include, but not limited to, infection, bleeding, blood clot (thrombus formation), and puncture of an artery; nerve damage and irregular heartbeat and possibility to perform a PICC exchange if needed/ordered by physician.  Alternatives to this procedure were also discussed.  Bard Power PICC patient education guide, fact sheet on infection prevention and patient information card has been provided to patient /or left at bedside.    PICC/Midline Placement Documentation  PICC Double Lumen 73/41/93 PICC Right Basilic 38 cm 2 cm (Active)  Indication for Insertion or Continuance of Line Administration of hyperosmolar/irritating solutions (i.e. TPN, Vancomycin, etc.) 02/13/19 0831  Exposed Catheter (cm) 0 cm 02/13/19 0831  Site Assessment Clean;Dry;Intact 02/13/19 0831  Lumen #1 Status Flushed;Blood return noted;Saline locked 02/13/19 0831  Lumen #2 Status Blood return noted;Flushed;Saline locked 02/13/19 0831  Dressing Type Transparent 02/13/19 0831  Dressing Status Clean;Dry;Intact;Antimicrobial disc in place 02/13/19 0831  Dressing Change Due 02/20/19 02/13/19 0831       [name] 02/13/2019, 8:34 AM

## 2019-02-13 NOTE — Progress Notes (Signed)
[facility] Progress Note    Assessment/ Plan:   1. AKI on CKD 3b - baseline creat 1.7- 2.0.  Renal US some ckd, UA negative, UNa < 10. AKI due to HRS , less likely contrast. BP's improved w/ HRS protocol, Cr stabilized overall in mid 2s.  Continue midodrine.  Very poor dialysis candidate overall.  I am not really adding anything to his care at this point- he will need supportive care going forward.  Will sign off.   2. Volume - up 7kg since admission, stopped IVF"s.  3. Cirrhosis/ hepatitis C - on lactulose, sig ascites 4. Perforated viscous - by CT scan on admit, seen by gen surg they felt he probably had a sealed perforated ulcer. Medical management was suggested given his overall poor health--> UGI this AM showing contained stomach perf, TNA recommended.  5. Schizoaffective d/o 6. Hx adrenal insuff - on steroids  Subjective:    Asking repeatedly for swabs.  Had PICC today.  Possible liquids tomorrow if keeps going in right direction.   Objective:   BP 115/73 (BP Location: Right Arm)   Pulse 70   Temp 98.2 F (36.8 C) (Oral)   Resp 18   Ht 6\' 6"  (1.981 m)   Wt 109.6 kg   SpO2 97%   BMI 27.92 kg/m   Intake/Output Summary (Last 24 hours) at 02/13/2019 1400 Last data filed at 02/13/2019 1329 Gross per 24 hour  Intake -  Output 850 ml  Net -850 ml   Weight change:   Physical Exam: Gen: chronically ill CVS: RRR Resp: clear anteriorly Abd: less tender than yesterday, ++ ascites Ext: 2+ LE edema  Imaging: Dg Ugi W Single Cm (sol Or Thin Ba)  Addendum Date: 02/12/2019   ADDENDUM REPORT: 02/12/2019 09:24 ADDENDUM: Critical Value/emergent results were called by telephone at the time of interpretation on 02/12/2019 at 9:20 am to [place], [place] for [name], who verbally acknowledged these results. Electronically Signed   By: Richardean Sale M.D.   On: 02/12/2019 09:24   Result Date: 02/12/2019 CLINICAL DATA:  GI bleed with free  intraperitoneal air on abdominal CT. EXAM: UPPER GI SERIES WITH KUB TECHNIQUE: After obtaining a scout radiograph a routine upper GI series was performed using water-soluble contrast (150 cc Omnipaque 300 orally). FLUOROSCOPY TIME:  Fluoroscopy Time: 1 minutes and 0 seconds of low-dose pulsed fluoroscopy. Radiation Exposure Index (if provided by the fluoroscopic device): 1 scout image. Five spot images. Number of Acquired Spot Images: 44.2 mGy COMPARISON:  Abdominopelvic CT 02/08/2019. FINDINGS: The scout abdominal radiograph demonstrates a normal bowel gas pattern and no obvious free intraperitoneal air. Mild lumbar spine degenerative changes are present. The fluoroscopic study is somewhat limited by the patient's limited mobility. The patient swallowed the contrast without difficulty. The esophagus demonstrates mild tertiary contractions without stricture or focal mucosal lesion. There is mild extrinsic compression on the distal esophagus by the patient's known esophageal varices. There is a focal contained perforation involving the distal stomach or proximal duodenum, likely pre-pyloric based on the oblique views and recent CT. No free extravasation of the contrast was demonstrated. The remainder of the stomach and proximal duodenum appear normal without obstruction. IMPRESSION: 1. Focal contained perforation in the distal stomach, likely accounting for the free intraperitoneal air on recent CT. No free extravasation of contrast or upper GI obstruction. 2. Mild esophageal tertiary contractions.  Known esophageal varices. Electronically Signed: By: Richardean Sale M.D. On: 02/12/2019 09:14   [place] Ekg  Site Rite  Result Date: 02/12/2019 If Site Rite image not attached, placement could not be confirmed due to current cardiac rhythm.   Labs: BMET Recent Labs  Lab 02/08/19 1149 02/08/19 1709 02/09/19 0317 02/10/19 1111 02/11/19 0345 02/12/19 0444 02/13/19 0552  NA 140  --  139 140 142 146* 146*  K 4.0   --  4.5 3.9 3.6 3.3* 3.4*  CL 112*  --  111 108 110 113* 112*  CO2 19*  --  19* 20* 19* 19* 19*  GLUCOSE 91  --  73 114* 83 91 97  BUN 26*  --  35* 43* 48* 49* 55*  CREATININE 1.98*  --  2.43* 2.34* 2.37* 2.49* 2.59*  CALCIUM 7.9*  --  7.8* 8.5* 8.9 9.2 9.3  PHOS  --  3.8  --   --   --   --   --    CBC Recent Labs  Lab 02/08/19 1149  02/10/19 1111 02/11/19 0345 02/12/19 0444 02/13/19 0552  WBC 6.1   < > 2.9* 3.9* 3.4* 3.5*  NEUTROABS 4.1  --   --   --   --  2.9  HGB 8.5*   < > 7.4* 7.6* 8.4* 8.0*  HCT 28.4*   < > 22.7* 23.6* 25.6* 24.5*  MCV 99.6   < > 92.7 92.5 92.1 92.8  PLT 159   < > 68* 81* 77* 74*   < > = values in this interval not displayed.    Medications:    . Chlorhexidine Gluconate Cloth  6 each Topical Daily  . insulin aspart  0-9 Units Subcutaneous Q4H  . midodrine  10 mg Oral TID WC      Bufford Buttner, MD 02/13/2019, 2:00 PM

## 2019-02-13 NOTE — Progress Notes (Signed)
Initial Nutrition Assessment   RD working remotely.  DOCUMENTATION CODES:   Not applicable  INTERVENTION:  TPN per Pharmacy.  RD to continue to monitor.   NUTRITION DIAGNOSIS:   Increased nutrient needs related to chronic illness(cirrhosis) as evidenced by estimated needs.  GOAL:   Patient will meet greater than or equal to 90% of their needs  MONITOR:   Skin, Weight trends, Labs, I & O's, Other (Comment)(TPN)  REASON FOR ASSESSMENT:   Consult New TPN/TNA  ASSESSMENT:   58 y.o. male with medical history significant of cirrhosis with splenomegaly, esophageal varices, hepatitis C, adrenal insufficiency, hypothyroidism, schizoaffective disorder, PTSD, GI bleed, neuropathy, chronic kidney disease presented to emergency department due to worsening abdominal pain, distention associated with rectal bleeding for 1 day.  Work-up revealed bowel perforation/pneumoperitoneum. CTAP at admission showed para-esophageal varices, severe ascites, splenomegaly.  Pt unavailable during attempted time of contact. RD unable to obtain pt nutrition history at this time. Pt underwent paracentesis for ascites 11/13 with removal of 2860 ml ascitic fluid. Per MD, diet unable to be advanced. Medical management with NPO. Pt non-op candidate due to decompensated liver disease. Pt continues to pass melenic stools. Plans for TPN initiation today.  Unable to complete Nutrition-Focused physical exam at this time.   Labs and medications reviewed.   Diet Order:   Diet Order            Diet NPO time specified Except for: Sips with Meds  Diet effective now              EDUCATION NEEDS:   Not appropriate for education at this time  Skin:  Skin Assessment: Reviewed RN Assessment  Last BM:  11/17  Height:   Ht Readings from Last 1 Encounters:  02/09/19 6\' 6"  (1.981 m)    Weight:   Wt Readings from Last 1 Encounters:  02/12/19 109.6 kg    Ideal Body Weight:  97 kg  BMI:  Body mass index  is 27.92 kg/m.  Estimated Nutritional Needs:   Kcal:  1771-1657  Protein:  125-145 grams  Fluid:  Per MD    Corrin Parker, MS, RD, LDN Pager # [phone] After hours/ weekend pager # [phone]

## 2019-02-13 NOTE — Consult Note (Signed)
[facility] Face-to-Face Psychiatry Consult   Reason for Consult:  Suicidal ideations Referring Physician:  Dr Jacqulyn BathPahwani Patient Identification: [name] MRN:  [id number] Principal Diagnosis: Bowel perforation [facility](HCC) Diagnosis:  Principal Problem:   Bowel perforation (HCC) Active Problems:   Schizoaffective disorder (HCC)   PTSD (post-traumatic stress disorder)   History of adrenal insufficiency   GI bleed   Cirrhosis (HCC)   Esophageal varices in cirrhosis (HCC)   CKD (chronic kidney disease) stage 3, GFR 30-59 ml/min   Abdominal pain   Bleeding per rectum   Septic shock (HCC)   Central venous catheter in place   Goals of care, counseling/discussion   Palliative care by specialist   AKI (acute kidney injury) (HCC)   Total Time spent with patient: 1 hour  Subjective:   [name] is a 58 y.o. male patient admitted with medical issues.  "I need Cogentin because I'm taking Haldol"  Patient seen and evaluated in person by this provider.  He requests Cogentin due to being on Haldol.  Denies suicidal/homicidal ideations, hallucinations, and substance abuse.  He has no concerns mentally at this time except wanting Cogentin back.    HPI per WU:[id number]:[name] is a 58 y.o. male with medical history significant of cirrhosis with splenomegaly, esophageal varices, hepatitis C, adrenal insufficiency, hypothyroidism, schizoaffective disorder, PTSD, GI bleed, neuropathy, chronic kidney disease presents to emergency department due to worsening abdominal pain, distention and rectal bleeding since 1 day.  Upon my evaluation: Patient received morphine and Ativan in the ED.  He is sleepy but arousable.  Reports severe abdominal pain, distention and rectal bleeding started yesterday.  Denies nausea, vomiting, fever, chills, chest pain, shortness of breath, palpitation, hematemesis, decreased appetite.  He had EGD on 01/10/2019 which showed  Grade D acute esophagitis, Very large, non-bleeding gastric ulcer in  antrum with no stigmata of bleeding. Non-bleeding duodenal ulcers.   Past Psychiatric History: depression, schizoaffective d/o., PTSD  Risk to Self:  none Risk to Others:  none Prior Inpatient Therapy:  yes Prior Outpatient Therapy:  yes  Past Medical History:  Past Medical History:  Diagnosis Date  . Anemia   . Ascites   . Chronic leg pain   . Cirrhosis (HCC)   . DU (perforated duodenal ulcer) (HCC) 02/13/2019   sealed perforated duodenal ulcer  . GERD (gastroesophageal reflux disease)   . H/O diabetes insipidus   . Hepatitis C    Completed therapy with [facility] ~ 2018.  suspect therapy over[place] [facility] [place]  . History of adrenal insufficiency   . History of ARDS   . History of blood transfusion   . Hypothyroidism   . Kidney disease    stage 3  . Neuropathy   . Overdose 12/2009  . Peripheral neuropathy   . Personality disorder (HCC)   . PTSD (post-traumatic stress disorder)    [place]CON[place]  . Schizoaffective disorder [facility](HCC)     Past Surgical History:  Procedure Laterality Date  . BIOPSY  01/10/2019   Procedure: BIOPSY;  Surgeon: Beverley FiedlerPyrtle, Jay M, MD;  [facility];  Service: Endoscopy;;  . CERVICAL SPINE SURGERY  unknown  . ESOPHAGOGASTRODUODENOSCOPY (EGD) WITH PROPOFOL N/A 01/10/2019   Procedure: ESOPHAGOGASTRODUODENOSCOPY (EGD) WITH PROPOFOL;  Surgeon: Beverley FiedlerPyrtle, Jay M, MD;  Location: [facility] ENDOSCOPY;  Service: Endoscopy;  Laterality: N/A;  . RIGHT HIP SURGERY  1996  . US GUIDED LEFT THORACENTESIS  01/21/2010   Family History:  Family History  Problem Relation  Age of Onset  . Cancer Mother   . Heart attack Father    Family Psychiatric  History: nond Social History:  Social History   Substance and Sexual Activity  Alcohol Use No   Comment: Pt denies      Social History   Substance and Sexual Activity  Drug Use No   Comment: Pt denies    Social History   Socioeconomic History  . Marital status: Single     Spouse name: Not on file  . Number of children: 0  . Years of education: 6612  . Highest education level: Not on file  Occupational History    Comment: umemployed  Social Needs  . Financial resource strain: Not on file  . Food insecurity    Worry: Not on file    Inability: Not on file  . Transportation needs    Medical: Not on file    Non-medical: Not on file  Tobacco Use  . Smoking status: Current Every Day Smoker    Packs/day: 1.00    Years: 25.00    Pack years: 25.00    Types: Cigarettes  . Smokeless tobacco: Never Used  Substance and Sexual Activity  . Alcohol use: No    Comment: Pt denies   . Drug use: No    Comment: Pt denies  . Sexual activity: Not Currently  Lifestyle  . Physical activity    Days per week: Not on file    Minutes per session: Not on file  . Stress: Not on file  Relationships  . Social Musicianconnections    Talks on phone: Not on file    Gets together: Not on file    Attends religious service: Not on file    Active member of club or organization: Not on file    Attends meetings of clubs or organizations: Not on file    Relationship status: Not on file  Other Topics Concern  . Not on file  Social History Narrative   Single, Lives with mother   [place] from [place] during[place]after [place]   Smoker, no drugs, denies recent EtOH   Unemployed   Medicaid [employer]   Additional Social History:    Allergies:  No Known Allergies  Labs:  Results for orders placed or performed during the hospital encounter of 02/08/19 (from the past 48 hour(s))  Glucose, capillary     Status: None   Collection Time: 02/11/19  3:48 PM  Result Value Ref Range   Glucose-Capillary 94 70 - 99 mg/dL  Glucose, capillary     Status: Abnormal   Collection Time: 02/11/19  7:13 PM  Result Value Ref Range   Glucose-Capillary 101 (H) 70 - 99 mg/dL  Glucose, capillary     Status: None   Collection Time: 02/12/19 12:01 AM  Result Value Ref Range    Glucose-Capillary 97 70 - 99 mg/dL  Glucose, capillary     Status: None   Collection Time: 02/12/19  3:18 AM  Result Value Ref Range   Glucose-Capillary 87 70 - 99 mg/dL  AMCBC     Status: Abnormal   Collection Time: 02/12/19  4:44 AM  Result Value Ref Range   WBC 3.4 (L) 4.0 - 10.5 K/uL   RBC 2.78 (L) 4.22 - 5.81 MIL/uL   Hemoglobin 8.4 (L) 13.0 - 17.0 g/dL   HCT 04.525.6 (L) 40.939.0 - 81.152.0 %   MCV 92.1 80.0 - 100.0 fL   MCH 30.2 26.0 - 34.0 pg   MCHC  32.8 30.0 - 36.0 g/dL   RDW 18.6 (H) 11.5 - 15.5 %   Platelets 77 (L) 150 - 400 K/uL    Comment: REPEATED TO VERIFY SPECIMEN CHECKED FOR CLOTS Immature Platelet Fraction may be clinically indicated, consider ordering this additional test [id number] CONSISTENT WITH PREVIOUS RESULT    nRBC 0.0 0.0 - 0.2 %    Comment: Performed at [facility], [place] [address], [place],  [address]  Comprehensive metabolic panel     Status: Abnormal   Collection Time: 02/12/19  4:44 AM  Result Value Ref Range   Sodium 146 (H) 135 - 145 mmol/L   Potassium 3.3 (L) 3.5 - 5.1 mmol/L   Chloride 113 (H) 98 - 111 mmol/L   CO2 19 (L) 22 - 32 mmol/L   Glucose, Bld 91 70 - 99 mg/dL   BUN 49 (H) 6 - 20 mg/dL   Creatinine, Ser 2.49 (H) 0.61 - 1.24 mg/dL   Calcium 9.2 8.9 - 10.3 mg/dL   Total Protein 5.8 (L) 6.5 - 8.1 g/dL   Albumin 3.7 3.5 - 5.0 g/dL   AST 11 (L) 15 - 41 U/L   ALT 9 0 - 44 U/L   Alkaline Phosphatase 32 (L) 38 - 126 U/L   Total Bilirubin 2.0 (H) 0.3 - 1.2 mg/dL   GFR calc non Af Amer 27 (L) >60 mL/min   GFR calc Af Amer 32 (L) >60 mL/min   Anion gap 14 5 - 15    Comment: Performed at [facility], [place] [address] [place], [place] [address]  Glucose, capillary     Status: None   Collection Time: 02/12/19  7:25 AM  Result Value Ref Range   Glucose-Capillary 74 70 - 99 mg/dL  Glucose, capillary     Status: None   Collection Time: 02/12/19 11:05 AM  Result Value Ref Range   Glucose-Capillary 76 70 - 99 mg/dL  Glucose,  capillary     Status: None   Collection Time: 02/12/19  3:01 PM  Result Value Ref Range   Glucose-Capillary 78 70 - 99 mg/dL  Glucose, capillary     Status: None   Collection Time: 02/12/19  8:07 PM  Result Value Ref Range   Glucose-Capillary 78 70 - 99 mg/dL  Glucose, capillary     Status: None   Collection Time: 02/12/19 11:03 PM  Result Value Ref Range   Glucose-Capillary 91 70 - 99 mg/dL  Glucose, capillary     Status: Abnormal   Collection Time: 02/13/19  4:22 AM  Result Value Ref Range   Glucose-Capillary 102 (H) 70 - 99 mg/dL  CBC with Differential/Platelet     Status: Abnormal   Collection Time: 02/13/19  5:52 AM  Result Value Ref Range   WBC 3.5 (L) 4.0 - 10.5 K/uL   RBC 2.64 (L) 4.22 - 5.81 MIL/uL   Hemoglobin 8.0 (L) 13.0 - 17.0 g/dL   HCT 24.5 (L) 39.0 - 52.0 %   MCV 92.8 80.0 - 100.0 fL   MCH 30.3 26.0 - 34.0 pg   MCHC 32.7 30.0 - 36.0 g/dL   RDW 18.7 (H) 11.5 - 15.5 %   Platelets 74 (L) 150 - 400 K/uL    Comment: REPEATED TO VERIFY Immature Platelet Fraction may be clinically indicated, consider ordering this additional test [id number] CONSISTENT WITH PREVIOUS RESULT    nRBC 0.0 0.0 - 0.2 %   Neutrophils Relative % 84 %   Neutro Abs 2.9 1.7 -  7.7 K/uL   Lymphocytes Relative 8 %   Lymphs Abs 0.3 (L) 0.7 - 4.0 K/uL   Monocytes Relative 7 %   Monocytes Absolute 0.3 0.1 - 1.0 K/uL   Eosinophils Relative 0 %   Eosinophils Absolute 0.0 0.0 - 0.5 K/uL   Basophils Relative 0 %   Basophils Absolute 0.0 0.0 - 0.1 K/uL   WBC Morphology MILD LEFT SHIFT (1-5% METAS, OCC MYELO, OCC BANDS)     Comment: TOXIC GRANULATION VACUOLATED NEUTROPHILS    Immature Granulocytes 1 %   Abs Immature Granulocytes 0.03 0.00 - 0.07 K/uL    Comment: Performed at [facility], [address] [place], [place] [address]  Basic metabolic panel     Status: Abnormal   Collection Time: 02/13/19  5:52 AM  Result Value Ref Range   Sodium 146 (H) 135 - 145 mmol/L   Potassium 3.4 (L)  3.5 - 5.1 mmol/L   Chloride 112 (H) 98 - 111 mmol/L   CO2 19 (L) 22 - 32 mmol/L   Glucose, Bld 97 70 - 99 mg/dL   BUN 55 (H) 6 - 20 mg/dL   Creatinine, Ser 5.97 (H) 0.61 - 1.24 mg/dL   Calcium 9.3 8.9 - 47.1 mg/dL   GFR calc non Af Amer 26 (L) >60 mL/min   GFR calc Af Amer 30 (L) >60 mL/min   Anion gap 15 5 - 15    Comment: Performed at [facility], [address], [place], [place] [address]  Glucose, capillary     Status: None   Collection Time: 02/13/19  7:23 AM  Result Value Ref Range   Glucose-Capillary 89 70 - 99 mg/dL  Glucose, capillary     Status: None   Collection Time: 02/13/19  1:39 PM  Result Value Ref Range   Glucose-Capillary 91 70 - 99 mg/dL    Current Facility-Administered Medications  Medication Dose Route Frequency Provider Last Rate Last Dose  . 0.9 %  sodium chloride infusion   Intra-arterial PRN Sherryll Burger, Pratik D, DO      . 0.9 %  sodium chloride infusion  250 mL Intravenous Continuous Oretha Milch, MD 10 mL/hr at 02/12/19 1300    . 0.9 %  sodium chloride infusion   Intravenous PRN Hughie Closs, MD   Stopped at 02/12/19 [phone]  . Chlorhexidine Gluconate Cloth 2 % PADS 6 each  6 each Topical Daily Oretha Milch, MD   6 each at 02/13/19 1017  . fentaNYL (SUBLIMAZE) injection 25-50 mcg  25-50 mcg Intravenous Q1H PRN Oretha Milch, MD   50 mcg at 02/13/19 0451  . haloperidol (HALDOL) tablet 2 mg  2 mg Oral Q6H PRN Icard, Bradley L, DO   2 mg at 02/11/19 2158   Or  . haloperidol lactate (HALDOL) injection 1 mg  1 mg Intramuscular Q6H PRN Icard, Bradley L, DO   1 mg at 02/13/19 1106  . haloperidol lactate (HALDOL) injection 0.5 mg  0.5 mg Intravenous BID BM & HS PRN Migdalia Dk, MD   0.5 mg at 02/12/19 2019  . insulin aspart (novoLOG) injection 0-9 Units  0-9 Units Subcutaneous Q4H Shah, Pratik D, DO      . midodrine (PROAMATINE) tablet 10 mg  10 mg Oral TID WC Sherryll Burger, Pratik D, DO   Stopped at 02/11/19 2574  . ondansetron (ZOFRAN) injection 4 mg  4 mg  Intravenous Q6H PRN Pahwani, Kasandra Knudsen, MD      .  pantoprazole (PROTONIX) 80 mg in sodium chloride 0.9 % 250 mL (0.32 mg/mL) infusion  8 mg/hr Intravenous Continuous Dianah Field, PA-C 25 mL/hr at 02/13/19 1435 8 mg/hr at 02/13/19 1435  . phytonadione (VITAMIN K) 5 mg in dextrose 5 % 50 mL IVPB  5 mg Intravenous Daily Dianah Field, PA-C 50 mL/hr at 02/13/19 1402 5 mg at 02/13/19 1402  . piperacillin-tazobactam (ZOSYN) IVPB 3.375 g  3.375 g Intravenous Q8H Vicente Serene, RPH 12.5 mL/hr at 02/13/19 1001 3.375 g at 02/13/19 1001  . sodium chloride flush (NS) 0.9 % injection 10-40 mL  10-40 mL Intracatheter PRN Pahwani, Kasandra Knudsen, MD        Musculoskeletal: Strength & Muscle Tone: decreased Gait & Station: did not witness Patient leans: N/A  Psychiatric Specialty Exam: Physical Exam  Nursing note and vitals reviewed. Constitutional: He is oriented to person, place, and time. He appears well-developed and well-nourished.  HENT:  Head: Normocephalic.  Neck: Normal range of motion.  Respiratory: Effort normal.  Musculoskeletal: Normal range of motion.  Neurological: He is alert and oriented to person, place, and time.  Psychiatric: His speech is normal and behavior is normal. Judgment and thought content normal. His mood appears anxious. His affect is blunt. Cognition and memory are normal. He exhibits a depressed mood.    Review of Systems  Musculoskeletal: Positive for back pain.  Psychiatric/Behavioral: Positive for depression. The patient is nervous/anxious.   All other systems reviewed and are negative.   Blood pressure 115/73, pulse 70, temperature 98.2 F (36.8 C), temperature source Oral, resp. rate 18, height 6\' 6"  (1.981 m), weight 109.6 kg, SpO2 97 %.Body mass index is 27.92 kg/m.  General Appearance: Casual  Eye Contact:  Good  Speech:  Normal Rate  Volume:  Normal  Mood:  Anxious and Depressed  Affect:  Blunt  Thought Process:  Coherent  Orientation:  Full (Time,  Place, and Person)  Thought Content:  WDL and Logical  Suicidal Thoughts:  No  Homicidal Thoughts:  No  Memory:  Immediate;   Good Recent;   Good Remote;   Good  Judgement:  Fair  Insight:  Fair  Psychomotor Activity:  Decreased  Concentration:  Concentration: Fair and Attention Span: Fair  Recall:  of Knowledge:  Fair  Language:  Good  Akathisia:  No  Handed:  Right  AIMS (if indicated):     Assets:  Leisure Time Resilience  ADL's:  Impaired  Cognition:  WNL  Sleep:      58 yo male admitted for medical concerns, consult placed for suicidal ideations.  Patient denies suicidal ideations on assessment but does request Cogentin.  No hallucinations, homicidal ideations, or substance abuse.    Treatment Plan Summary: Schizoaffective disorder: -Recommend Haldol 2 mg BID  EPS: -Recommend Cogentin 1 mg daily  Disposition: No evidence of imminent risk to self or others at present.    41, NP 02/13/2019 2:57 PM

## 2019-02-14 ENCOUNTER — Encounter (HOSPITAL_COMMUNITY): Payer: Self-pay | Admitting: Radiology

## 2019-02-14 ENCOUNTER — Inpatient Hospital Stay (HOSPITAL_COMMUNITY): Payer: Medicaid Other

## 2019-02-14 DIAGNOSIS — R188 Other ascites: Secondary | ICD-10-CM | POA: Diagnosis not present

## 2019-02-14 DIAGNOSIS — K7460 Unspecified cirrhosis of liver: Secondary | ICD-10-CM | POA: Diagnosis not present

## 2019-02-14 DIAGNOSIS — K631 Perforation of intestine (nontraumatic): Secondary | ICD-10-CM | POA: Diagnosis not present

## 2019-02-14 HISTORY — PX: IR PARACENTESIS: IMG2679

## 2019-02-14 LAB — MAGNESIUM: Magnesium: 2.2 mg/dL (ref 1.7–2.4)

## 2019-02-14 LAB — BODY FLUID CELL COUNT WITH DIFFERENTIAL
Eos, Fluid: 0 %
Lymphs, Fluid: 2 %
Monocyte-Macrophage-Serous Fluid: 5 % — ABNORMAL LOW (ref 50–90)
Neutrophil Count, Fluid: 93 % — ABNORMAL HIGH (ref 0–25)
Total Nucleated Cell Count, Fluid: 2675 cu mm — ABNORMAL HIGH (ref 0–1000)

## 2019-02-14 LAB — COMPREHENSIVE METABOLIC PANEL
ALT: 9 U/L (ref 0–44)
AST: 16 U/L (ref 15–41)
Albumin: 2.9 g/dL — ABNORMAL LOW (ref 3.5–5.0)
Alkaline Phosphatase: 36 U/L — ABNORMAL LOW (ref 38–126)
Anion gap: 16 — ABNORMAL HIGH (ref 5–15)
BUN: 55 mg/dL — ABNORMAL HIGH (ref 6–20)
CO2: 19 mmol/L — ABNORMAL LOW (ref 22–32)
Calcium: 9.8 mg/dL (ref 8.9–10.3)
Chloride: 114 mmol/L — ABNORMAL HIGH (ref 98–111)
Creatinine, Ser: 2.59 mg/dL — ABNORMAL HIGH (ref 0.61–1.24)
GFR calc Af Amer: 30 mL/min — ABNORMAL LOW (ref >60)
GFR calc non Af Amer: 26 mL/min — ABNORMAL LOW (ref >60)
Glucose, Bld: 78 mg/dL (ref 70–99)
Potassium: 3.3 mmol/L — ABNORMAL LOW (ref 3.5–5.1)
Sodium: 149 mmol/L — ABNORMAL HIGH (ref 135–145)
Total Bilirubin: 2.2 mg/dL — ABNORMAL HIGH (ref 0.3–1.2)
Total Protein: 5.2 g/dL — ABNORMAL LOW (ref 6.5–8.1)

## 2019-02-14 LAB — CBC WITH DIFFERENTIAL/PLATELET
Abs Immature Granulocytes: 0.05 10*3/uL (ref 0.00–0.07)
Basophils Absolute: 0 10*3/uL (ref 0.0–0.1)
Basophils Relative: 0 %
Eosinophils Absolute: 0 10*3/uL (ref 0.0–0.5)
Eosinophils Relative: 0 %
HCT: 25.7 % — ABNORMAL LOW (ref 39.0–52.0)
Hemoglobin: 8.3 g/dL — ABNORMAL LOW (ref 13.0–17.0)
Immature Granulocytes: 1 %
Lymphocytes Relative: 8 %
Lymphs Abs: 0.4 10*3/uL — ABNORMAL LOW (ref 0.7–4.0)
MCH: 29.7 pg (ref 26.0–34.0)
MCHC: 32.3 g/dL (ref 30.0–36.0)
MCV: 92.1 fL (ref 80.0–100.0)
Monocytes Absolute: 0.3 10*3/uL (ref 0.1–1.0)
Monocytes Relative: 5 %
Neutro Abs: 4.3 10*3/uL (ref 1.7–7.7)
Neutrophils Relative %: 86 %
Platelets: 76 10*3/uL — ABNORMAL LOW (ref 150–400)
RBC: 2.79 MIL/uL — ABNORMAL LOW (ref 4.22–5.81)
RDW: 18.7 % — ABNORMAL HIGH (ref 11.5–15.5)
WBC: 5 10*3/uL (ref 4.0–10.5)
nRBC: 0 % (ref 0.0–0.2)

## 2019-02-14 LAB — GLUCOSE, CAPILLARY
Glucose-Capillary: 101 mg/dL — ABNORMAL HIGH (ref 70–99)
Glucose-Capillary: 103 mg/dL — ABNORMAL HIGH (ref 70–99)
Glucose-Capillary: 111 mg/dL — ABNORMAL HIGH (ref 70–99)
Glucose-Capillary: 124 mg/dL — ABNORMAL HIGH (ref 70–99)
Glucose-Capillary: 78 mg/dL (ref 70–99)
Glucose-Capillary: 79 mg/dL (ref 70–99)
Glucose-Capillary: 84 mg/dL (ref 70–99)

## 2019-02-14 LAB — TRIGLYCERIDES: Triglycerides: 85 mg/dL (ref <150)

## 2019-02-14 LAB — PREALBUMIN: Prealbumin: <5 mg/dL — ABNORMAL LOW (ref 18–38)

## 2019-02-14 LAB — PHOSPHORUS: Phosphorus: 2.6 mg/dL (ref 2.5–4.6)

## 2019-02-14 MED ORDER — POTASSIUM CHLORIDE 10 MEQ/100ML IV SOLN
10.0000 meq | INTRAVENOUS | Status: AC
  Administered 2019-02-14 (×2): 10 meq via INTRAVENOUS
  Filled 2019-02-14 (×2): qty 100

## 2019-02-14 MED ORDER — LIDOCAINE HCL 1 % IJ SOLN
INTRAMUSCULAR | Status: AC
  Filled 2019-02-14: qty 20

## 2019-02-14 MED ORDER — DEXTROSE 5 % IV SOLN
INTRAVENOUS | Status: DC
  Administered 2019-02-14: 10:00:00 via INTRAVENOUS

## 2019-02-14 MED ORDER — ALBUMIN HUMAN 25 % IV SOLN
25.0000 g | Freq: Four times a day (QID) | INTRAVENOUS | Status: AC
  Administered 2019-02-14 (×2): 25 g via INTRAVENOUS
  Filled 2019-02-14 (×3): qty 100

## 2019-02-14 MED ORDER — LIDOCAINE HCL (PF) 1 % IJ SOLN
INTRAMUSCULAR | Status: DC | PRN
  Administered 2019-02-14: 10 mL

## 2019-02-14 NOTE — Progress Notes (Addendum)
Daily Rounding Note  02/14/2019, 10:36 AM  LOS: 6 days   SUBJECTIVE:   Chief complaint: Perforated gastric ulcer.  Melena.  Anemia. Diet advanced to clear liquids I Dr. Ninfa Linden this morning.  Patient is enjoying being able to drink water.  His clear liquid tray is completely devoid of any remaining "foods".  OBJECTIVE:         Vital signs in last 24 hours:    Temp:  [98 F (36.7 C)-98.3 F (36.8 C)] 98 F (36.7 C) (11/18 0411) Pulse Rate:  [68-71] 71 (11/18 0411) Resp:  [17] 17 (11/18 0411) BP: (120-130)/(66-73) 120/69 (11/18 0411) SpO2:  [96 %-99 %] 99 % (11/18 0411) Last BM Date: 02/13/19 Filed Weights   02/11/19 0500 02/11/19 0501 02/12/19 0136  Weight: 109.9 kg 109.9 kg 109.6 kg   General: Pleasant, looks chronically ill and pale.  Alert and talking a lot. Heart: RRR. Chest: No labored breathing or cough.  Lungs clear in the front. Abdomen: Though he has not not yet had his paracentesis, his abdomen is a bit softer and less distended today.  Very dark brown liquid stool in the Flexi-Seal, this is not burgundy or reddish in color.  Thus improved from yesterday. Extremities: No CCE. Neuro/Psych: Oriented to place, year, self.  Somewhat rambling speech but is able to answer questions appropriately and move all limbs.  Intake/Output from previous day: 11/17 0701 - 11/18 0700 In: 609.5 [I.V.:453.9; IV Piggyback:155.6] Out: 875 [Urine:875]  Intake/Output this shift: Total I/O In: 1460 [P.O.:1460] Out: 100 [Urine:100]  Lab Results: Recent Labs    02/12/19 0444 02/13/19 0552 02/14/19 0619  WBC 3.4* 3.5* 5.0  HGB 8.4* 8.0* 8.3*  HCT 25.6* 24.5* 25.7*  PLT 77* 74* 76*   BMET Recent Labs    02/12/19 0444 02/13/19 0552 02/14/19 0619  NA 146* 146* 149*  K 3.3* 3.4* 3.3*  CL 113* 112* 114*  CO2 19* 19* 19*  GLUCOSE 91 97 78  BUN 49* 55* 55*  CREATININE 2.49* 2.59* 2.59*  CALCIUM 9.2 9.3 9.8   LFT  Recent Labs    02/12/19 0444 02/14/19 0619  PROT 5.8* 5.2*  ALBUMIN 3.7 2.9*  AST 11* 16  ALT 9 9  ALKPHOS 32* 36*  BILITOT 2.0* 2.2*   PT/INR No results for input(s): LABPROT, INR in the last 72 hours. Hepatitis Panel No results for input(s): HEPBSAG, HCVAB, HEPAIGM, HEPBIGM in the last 72 hours.  Studies/Results: [employer]  Result Date: 02/12/2019 If [employer] not attached, placement could not be confirmed due to current cardiac rhythm.  Scheduled Meds: . Chlorhexidine Gluconate Cloth  6 each Topical Daily  . insulin aspart  0-9 Units Subcutaneous Q4H  . midodrine  10 mg Oral TID WC   Continuous Infusions: . sodium chloride    . sodium chloride Stopped (02/12/19 1737)  . sodium chloride 250 mL (02/14/19 0607)  . albumin human    . dextrose 50 mL/hr at 02/14/19 0959  . pantoprozole (PROTONIX) infusion 8 mg/hr (02/14/19 0601)  . phytonadione (VITAMIN K) IV 5 mg (02/13/19 1402)  . piperacillin-tazobactam (ZOSYN)  IV 3.375 g (02/14/19 1000)  . potassium chloride 10 mEq (02/14/19 0958)   PRN Meds:.Place/Maintain arterial line **AND** sodium chloride, sodium chloride, fentaNYL (SUBLIMAZE) injection, haloperidol **OR** haloperidol lactate, haloperidol lactate, ondansetron (ZOFRAN) IV, sodium chloride flush  ASSESMENT:   *   Perforated GU, contained.  Day 7 Zosyn, n.p.o., TPN.  High risk surgical candidate due to decompensated cirrhosis. Recurrent burgundy stools. 01/10/2019 EGD for hematemesis findings included severe esophagitis, small hiatal hernia, retained gastric food.  Large, nonbleeding gastric antral ulcer, nonbleeding duodenal ulcers.  No gastric or esophageal varices. Protonix 40 IV BID >> gtt (day 2) in place.  *     Hypokalemia.  *    Normocytic anemia.  *    Cirrhosis of the liver, decompensated.  Eradicated hepatitis C.  *    Ascites.  SBP on previous 02/09/2019 paracentesis.  *    Coagulopathy.  Day 2/3 of IV vitamin K.  Received 2 FFP on  11/14.  *    Severe PTSD, schizoaffective disorder with psychosis.  Chronic mental health issues.  Acute mental status issues not consistent with hepatic encephalopathy. Hgb 8 >> 8.3.  Received 2 PRBCs 11/13 -11/14.     PLAN   *    Continue IV Protonix drip for 72 hours.  CBC in the morning.  INR/PT in the morning  *    Diet management per general surgery.  *   Paracentesis today, 2, every 6 hour, doses of IV albumin 25 g ordered post procedure   Jennye Moccasin  02/14/2019, 10:36 AM Phone [phone]  I have discussed the case with the PA, and that is the plan I formulated. I personally interviewed and examined the patient.  CC: Perforated gastric ulcer Decompensated liver cirrhosis as noted above. Portal hypertensive ascites.  Underwent paracentesis today, and cell count shows 2700 WBCs, 93% neutrophils.  This is because of the patient's gastric perforation, and he is already on Zosyn.  His GI bleeding seems to have slowed down and his hemoglobin is relatively stable over the last couple of days.  He might survive this episode if he does not have worsening of the gastric perforation or recurrence of brisk bleeding.  Even if he does, his long-term prognosis is very poor. If he develops ongoing GI bleeding with continued drop in his hemoglobin, then he would either need to be offered a very high risk surgery or be considered for comfort care.  There is no endoscopic therapy to offer for this.  Our service has nothing further to add at this point.  If he seems to need periodic paracentesis, this can be arranged with interventional radiology. I will leave considerations of diet advancement to the surgical service. Signing off, call as needed.  Total time 30 minutes  Charlie Pitter III Office: [phone]

## 2019-02-14 NOTE — Procedures (Signed)
PROCEDURE SUMMARY:  Successful US guided paracentesis from LLQ.  Yielded 5 L of clear yellow fluid.  No immediate complications.  Pt tolerated well.   Specimen was sent for labs.  EBL < 63mL  Ascencion Dike PA-C 02/14/2019 12:34 PM

## 2019-02-14 NOTE — Progress Notes (Signed)
PROGRESS NOTE    [name]  LZJ:[id number] DOB: 02-19-1961 DOA: 02/08/2019 PCP: Nolene Ebbs, MD   Brief Narrative: 58 year old with past medical history significant for cirrhosis with splenomegaly, esophageal varices, hepatitis C, adrenal insufficiency, hypothyroidism, schizoaffective disorder, PTSD, GI bleed, neuropathy, chronic kidney disease presented to the emergency department due to worsening abdominal pain, distention associated with rectal bleeding for 1 day.  Work-up revealed bowel perforation pneumoperitoneum.  Surgery consulted and recommended no operative intervention due to high risk of mortality, antibiotics and bowel rest.  GI was consulted for possible paracentesis due to significant ascites as well as dark maroon stool and acute blood loss anemia.  Protonix infusion was initiated as well as octreotide tried as patient has history of esophageal varices.  Patient developed low blood pressure.  CCM was consulted.  Patient was transferred back to triad service on 02/12/2019.  Patient underwent UGI on 11/16 and he was found to have focal contained perforation in the distal stomach.  General surgery and GI recommended keep NPO.  No surgical intervention due to poor surgical candidate.   Assessment & Plan:   Principal Problem:   Bowel perforation (HCC) Active Problems:   Schizoaffective disorder (HCC)   PTSD (post-traumatic stress disorder)   History of adrenal insufficiency   GI bleed   Cirrhosis (HCC)   Esophageal varices in cirrhosis (HCC)   CKD (chronic kidney disease) stage 3, GFR 30-59 ml/min   Abdominal pain   Bleeding per rectum   Septic shock ([place])   Central venous catheter in place   Goals of care, counseling/discussion   Palliative care by specialist   AKI (acute kidney injury) ([place])  1-Septic Shock due to bowel perforation/pneumoperitoneum/gastric perforation: Evaluated by [facility] and deemed not to be a surgical candidate given high mortality. Patient  endoscopy with no esophageal varices. Patient was on vasopressors at one point during his stay in the ICU. He has received a couple of dose of albumin. He is currently on midodrine.  On IV Zosyn. GI series showed gastric perforation with contained pneumoperitoneum. Patient was a started on TPN on 11/16. Patient was a started on clear diet today.  2-Hystory of adrenal insufficiency: He is on 50 mg of prednisone daily  3-Cirrhosis with ascites: Meld score 23 , Child Pugh C; History of esophageal varices, HCV (treated with Epclusa in 2018, [facility]). GI following, appreciate assistance Underwent paracentesis on 11/18  Anemia with GI bleed: Due to known gastric and duodenal ulcer had EGD Oct 2020 which showed grade D esophagitis, 2cm hiatal hernia, 5cm non-bleeding gastric ulcer and non-bleeding duodenal ulcers). Continue with PPI  Acute renal failure in the setting of chronic kidney disease a stage III: Creatinine baseline 1.7--2.0 Renal ultrasound showed chronic kidney disease Nephrology has signed off Monitor creatinine.  Hyponatremia: We will provide gentle hydration with D5  Hypokalemia; replete IV  History of PTSD and schizoaffective disorder chronic pain On fentanyl Patient expressed some suicidal ideation, psychiatric was consulted.  He was cleared by psych. On as needed Haldol     Nutrition Problem: Increased nutrient needs Etiology: chronic illness(cirrhosis)    Signs/Symptoms: estimated needs    Interventions: TPN  Estimated body mass index is 27.92 kg/m as calculated from the following:   Height as of this encounter: 6\' 6"  (1.981 m).   Weight as of this encounter: 109.6 kg.   DVT prophylaxis: SCD Code Status: Full code Family Communication: he relates he doesn't have family here, he has cousin in  [place], he has many friends Disposition Plan: will need safe discharge plan, he was living by himself. CM consulted.   Consultants:  General surgery GI  Procedures:   US paracentesis 02/14/2019  Antimicrobials:  Zosyn   Subjective: Alert, he is happy he able to drink fluids.  He is still having diarrhea.  Report mild abdominal pain.  He wants to go home  Objective: Vitals:   02/13/19 1600 02/13/19 2031 02/14/19 0411 02/14/19 1257  BP: 120/66 130/73 120/69 122/67  Pulse: 68 70 71 63  Resp:  17 17 18   Temp: 98.1 F (36.7 C) 98.3 F (36.8 C) 98 F (36.7 C) 97.6 F (36.4 C)  TempSrc: Oral Oral Oral Oral  SpO2: 97% 96% 99% 98%  Weight:      Height:        Intake/Output Summary (Last 24 hours) at 02/14/2019 1641 Last data filed at 02/14/2019 1505 Gross per 24 hour  Intake 3749.47 ml  Output 1100 ml  Net 2649.47 ml   Filed Weights   02/11/19 0500 02/11/19 0501 02/12/19 0136  Weight: 109.9 kg 109.9 kg 109.6 kg    Examination:  General exam: Appears calm and comfortable  Respiratory system: Clear to auscultation. Respiratory effort normal. Cardiovascular system: S1 & S2 heard, RRR. No JVD, murmurs, rubs, gallops or clicks. No pedal edema. Gastrointestinal system: Abdomen is very distended, soft and nontender. No organomegaly or masses felt. Normal bowel sounds heard. Central nervous system: Alert and oriented. Extremities: Symmetric 5 x 5 power. Skin: No rashes, lesions or ulcers    Data Reviewed: I have personally reviewed following labs and imaging studies  CBC: Recent Labs  Lab 02/08/19 1149  02/10/19 1111 02/11/19 0345 02/12/19 0444 02/13/19 0552 02/14/19 0619  WBC 6.1   < > 2.9* 3.9* 3.4* 3.5* 5.0  NEUTROABS 4.1  --   --   --   --  2.9 4.3  HGB 8.5*   < > 7.4* 7.6* 8.4* 8.0* 8.3*  HCT 28.4*   < > 22.7* 23.6* 25.6* 24.5* 25.7*  MCV 99.6   < > 92.7 92.5 92.1 92.8 92.1  PLT 159   < > 68* 81* 77* 74* 76*   < > = values in this interval not displayed.   Basic Metabolic Panel: Recent Labs  Lab 02/08/19 1709  02/10/19 1111 02/11/19 0345 02/12/19 0444 02/13/19  0552 02/14/19 0619  NA  --    < > 140 142 146* 146* 149*  K  --    < > 3.9 3.6 3.3* 3.4* 3.3*  CL  --    < > 108 110 113* 112* 114*  CO2  --    < > 20* 19* 19* 19* 19*  GLUCOSE  --    < > 114* 83 91 97 78  BUN  --    < > 43* 48* 49* 55* 55*  CREATININE  --    < > 2.34* 2.37* 2.49* 2.59* 2.59*  CALCIUM  --    < > 8.5* 8.9 9.2 9.3 9.8  MG 1.8  --   --   --   --   --  2.2  PHOS 3.8  --   --   --   --   --  2.6   < > = values in this interval not displayed.   GFR: Estimated Creatinine Clearance: 40.2 mL/min (A) (by C-G formula based on SCr of 2.59 mg/dL (H)). Liver Function Tests: Recent Labs  Lab 02/09/19 0317 02/10/19 1111  02/11/19 0345 02/12/19 0444 02/14/19 0619  AST 16 15 15  11* 16  ALT 10 10 9 9 9   ALKPHOS 57 37* 31* 32* 36*  BILITOT 1.3* 2.0* 1.6* 2.0* 2.2*  PROT 4.3* 5.2* 5.5* 5.8* 5.2*  ALBUMIN 1.9* 3.1* 3.8 3.7 2.9*   No results for input(s): LIPASE, AMYLASE in the last 168 hours. Recent Labs  Lab 02/08/19 1709 02/10/19 1111  AMMONIA 72* 32   Coagulation Profile: Recent Labs  Lab 02/08/19 1522 02/09/19 0317 02/09/19 1611 02/10/19 1111  INR 1.6* 1.8* 2.3* 2.0*   Cardiac Enzymes: No results for input(s): CKTOTAL, CKMB, CKMBINDEX, TROPONINI in the last 168 hours. BNP (last 3 results) No results for input(s): PROBNP in the last 8760 hours. HbA1C: No results for input(s): HGBA1C in the last 72 hours. CBG: Recent Labs  Lab 02/14/19 0024 02/14/19 0352 02/14/19 0801 02/14/19 1254 02/14/19 1608  GLUCAP 84 78 79 103* 111*   Lipid Profile: Recent Labs    02/14/19 0620  TRIG 85   Thyroid Function Tests: No results for input(s): TSH, T4TOTAL, FREET4, T3FREE, THYROIDAB in the last 72 hours. Anemia Panel: No results for input(s): VITAMINB12, FOLATE, FERRITIN, TIBC, IRON, RETICCTPCT in the last 72 hours. Sepsis Labs: Recent Labs  Lab 02/09/19 1405 02/10/19 1111  LATICACIDVEN 3.3* 1.6    Recent Results (from the past 240 hour(s))  SARS  CORONAVIRUS 2 (TAT 6-24 HRS) Nasopharyngeal Nasopharyngeal Swab     Status: None   Collection Time: 02/08/19  3:20 PM   Specimen: Nasopharyngeal Swab  Result Value Ref Range Status   SARS Coronavirus 2 NEGATIVE NEGATIVE Final    Comment: (NOTE) SARS-CoV-2 target nucleic acids are NOT DETECTED. The SARS-CoV-2 RNA is generally detectable in upper and lower respiratory specimens during the acute phase of infection. Negative results do not preclude SARS-CoV-2 infection, do not rule out co-infections with other pathogens, and should not be used as the sole basis for treatment or other patient management decisions. Negative results must be combined with clinical observations, patient history, and epidemiological information. The expected result is Negative. Fact Sheet for Patients: HairSlick.nohttps://www.fda.gov/media/138098/download Fact Sheet for Healthcare Providers: quierodirigir.comhttps://www.fda.gov/media/138095/download This test is not yet approved or cleared by the [place] FDA and  has been authorized for detection and/or diagnosis of SARS-CoV-2 by FDA under an Emergency Use Authorization (EUA). This EUA will remain  in effect (meaning this test can be used) for the duration of the COVID-19 declaration under Section 56 4(b)(1) of the Act, 21 U.S.C. section 360bbb-3(b)(1), unless the authorization is terminated or revoked sooner. Performed at [facility], [address] [place], [place] [address]   Blood culture (routine x 2)     Status: None   Collection Time: 02/08/19  5:08 PM   Specimen: BLOOD RIGHT WRIST  Result Value Ref Range Status   Specimen Description BLOOD RIGHT WRIST  Final   Special Requests   Final    AEROBIC BOTTLE ONLY Blood Culture results may not be optimal due to an inadequate volume of blood received in culture bottles   Culture   Final    NO GROWTH 5 DAYS Performed at [facility], [address] [place], [place] [address]    Report Status 02/13/2019 FINAL   Final  Blood culture (routine x 2)     Status: None   Collection Time: 02/08/19  5:10 PM   Specimen: BLOOD RIGHT HAND  Result Value Ref Range Status   Specimen Description BLOOD RIGHT HAND  Final   Special Requests   Final    BOTTLES DRAWN AEROBIC AND ANAEROBIC Blood Culture results may not be optimal due to an inadequate volume of blood received in culture bottles   Culture   Final    NO GROWTH 5 DAYS Performed at [facility], [address], [place], [place] [address]    Report Status 02/13/2019 FINAL  Final  MRSA PCR Screening     Status: None   Collection Time: 02/09/19  3:30 PM   Specimen: Nasopharyngeal  Result Value Ref Range Status   MRSA by PCR NEGATIVE NEGATIVE Final    Comment:        The GeneXpert MRSA Assay (FDA approved for NASAL specimens only), is one component of a comprehensive MRSA colonization surveillance program. It is not intended to diagnose MRSA infection nor to guide or monitor treatment for MRSA infections. Performed at [facility], [address] [place], [place] [address]   Body fluid culture (includes gram stain)     Status: None   Collection Time: 02/09/19  5:30 PM   Specimen: Pleural Fluid  Result Value Ref Range Status   Specimen Description FLUID  Final   Special Requests NONE  Final   Gram Stain   Final    FEW WBC PRESENT, PREDOMINANTLY PMN NO ORGANISMS SEEN    Culture   Final    NO GROWTH 3 DAYS Performed at [facility], [address] [place], [place] [address]    Report Status 02/13/2019 FINAL  Final  Fungus Culture With Stain     Status: None (Preliminary result)   Collection Time: 02/09/19  5:30 PM   Specimen: Peritoneal Cavity; Pleural Fluid  Result Value Ref Range Status   Fungus Stain Final report  Final    Comment: (NOTE) Performed At: [facility] [address] [place], [place] [id number] Jolene Schimke MD UV:[id number]    Fungus (Mycology) Culture PENDING  Incomplete   Fungal Source  FLUID  Final    Comment: Performed at [facility], [address] [place], [place] [address]  Fungus Culture Result     Status: None   Collection Time: 02/09/19  5:30 PM  Result Value Ref Range Status   Result 1 Comment  Final    Comment: (NOTE) KOH/Calcofluor preparation:  no fungus observed. Performed At: [facility] [address] [place], [place] [id number] Jolene Schimke MD PI:[id number]          Radiology Studies: Ir Paracentesis  Result Date: 02/14/2019 INDICATION: Cirrhosis, ascites. Request for diagnostic and therapeutic paracentesis up to 5 L max. EXAM: ULTRASOUND GUIDED LEFT LOWER QUADRANT PARACENTESIS MEDICATIONS: None. COMPLICATIONS: None immediate. PROCEDURE: Informed written consent was obtained from the patient after a discussion of the risks, benefits and alternatives to treatment. A timeout was performed prior to the initiation of the procedure. Initial ultrasound scanning demonstrates a large amount of ascites within the left lower abdominal quadrant. The left lower abdomen was prepped and draped in the usual sterile fashion. 1% lidocaine was used for local anesthesia. Following this, a 19 gauge, 7-cm, Yueh catheter was introduced. An ultrasound image was saved for documentation purposes. The paracentesis was performed. The catheter was removed and a dressing was applied. The patient tolerated the procedure well without immediate post procedural complication. FINDINGS: A total of approximately 5 L of clear yellow fluid was removed. Samples were sent to the laboratory as requested by the clinical team. IMPRESSION: Successful ultrasound-guided paracentesis  yielding 5 liters of peritoneal fluid. Read by: Brayton ElKevin Bruning PA-C Electronically Signed   By: Gilmer MorJaime  Wagner D.O.   On: 02/14/2019 12:36        Scheduled Meds: . Chlorhexidine Gluconate Cloth  6 each Topical Daily  . insulin aspart  0-9 Units Subcutaneous Q4H  . lidocaine      . midodrine  10 mg  Oral TID WC   Continuous Infusions: . sodium chloride    . sodium chloride Stopped (02/12/19 1737)  . sodium chloride 250 mL (02/14/19 0607)  . albumin human    . dextrose 50 mL/hr at 02/14/19 0959  . pantoprozole (PROTONIX) infusion 8 mg/hr (02/14/19 1340)  . phytonadione (VITAMIN K) IV 5 mg (02/14/19 1334)  . piperacillin-tazobactam (ZOSYN)  IV 3.375 g (02/14/19 1000)     LOS: 6 days    Time spent: 35 minutes.     Alba CoryBelkys A Larosa Rhines, MD [facility]   If 7PM-7AM, please contact night-coverage [email or web address] Password [email or web address] 02/14/2019, 4:41 PM

## 2019-02-14 NOTE — Progress Notes (Signed)
Subjective/Chief Complaint: Denies abdominal pain this morning   Objective: Vital signs in last 24 hours: Temp:  [98 F (36.7 C)-98.3 F (36.8 C)] 98 F (36.7 C) (11/18 0411) Pulse Rate:  [68-71] 71 (11/18 0411) Resp:  [17] 17 (11/18 0411) BP: (120-130)/(66-73) 120/69 (11/18 0411) SpO2:  [96 %-99 %] 99 % (11/18 0411) Last BM Date: 02/13/19  Intake/Output from previous day: 11/17 0701 - 11/18 0700 In: 609.5 [I.V.:453.9; IV Piggyback:155.6] Out: 875 [Urine:875] Intake/Output this shift: No intake/output data recorded.  Exam: Abdomen soft, non tender in epigastrium  Lab Results:  Recent Labs    02/13/19 0552 02/14/19 0619  WBC 3.5* 5.0  HGB 8.0* 8.3*  HCT 24.5* 25.7*  PLT 74* 76*   BMET Recent Labs    02/13/19 0552 02/14/19 0619  NA 146* 149*  K 3.4* 3.3*  CL 112* 114*  CO2 19* 19*  GLUCOSE 97 78  BUN 55* 55*  CREATININE 2.59* 2.59*  CALCIUM 9.3 9.8   PT/INR No results for input(s): LABPROT, INR in the last 72 hours. ABG No results for input(s): PHART, HCO3 in the last 72 hours.  Invalid input(s): PCO2, PO2  Studies/Results: Dg Ugi W Single Cm (sol Or Thin Ba)  Addendum Date: 02/12/2019   ADDENDUM REPORT: 02/12/2019 09:24 ADDENDUM: Critical Value/emergent results were called by telephone at the time of interpretation on 02/12/2019 at 9:20 am to providerJESSICA [facility], [place] for Dr Abigail Miyamoto, who verbally acknowledged these results. Electronically Signed   By: Carey Bullocks M.D.   On: 02/12/2019 09:24   Result Date: 02/12/2019 CLINICAL DATA:  GI bleed with free intraperitoneal air on abdominal CT. EXAM: UPPER GI SERIES WITH KUB TECHNIQUE: After obtaining a scout radiograph a routine upper GI series was performed using water-soluble contrast (150 cc Omnipaque 300 orally). FLUOROSCOPY TIME:  Fluoroscopy Time: 1 minutes and 0 seconds of low-dose pulsed fluoroscopy. Radiation Exposure Index (if provided by the fluoroscopic device): 1 scout image.  Five spot images. Number of Acquired Spot Images: 44.2 mGy COMPARISON:  Abdominopelvic CT 02/08/2019. FINDINGS: The scout abdominal radiograph demonstrates a normal bowel gas pattern and no obvious free intraperitoneal air. Mild lumbar spine degenerative changes are present. The fluoroscopic study is somewhat limited by the patient's limited mobility. The patient swallowed the contrast without difficulty. The esophagus demonstrates mild tertiary contractions without stricture or focal mucosal lesion. There is mild extrinsic compression on the distal esophagus by the patient's known esophageal varices. There is a focal contained perforation involving the distal stomach or proximal duodenum, likely pre-pyloric based on the oblique views and recent CT. No free extravasation of the contrast was demonstrated. The remainder of the stomach and proximal duodenum appear normal without obstruction. IMPRESSION: 1. Focal contained perforation in the distal stomach, likely accounting for the free intraperitoneal air on recent CT. No free extravasation of contrast or upper GI obstruction. 2. Mild esophageal tertiary contractions.  Known esophageal varices. Electronically Signed: By: Carey Bullocks M.D. On: 02/12/2019 09:14   [place] Ekg Site Rite  Result Date: 02/12/2019 If Site Rite image not attached, placement could not be confirmed due to current cardiac rhythm.   Anti-infectives: Anti-infectives (From admission, onward)   Start     Dose/Rate Route Frequency Ordered Stop   02/08/19 2200  piperacillin-tazobactam (ZOSYN) IVPB 3.375 g     3.375 g 12.5 mL/hr over 240 Minutes Intravenous Every 8 hours 02/08/19 1552     02/08/19 1545  piperacillin-tazobactam (ZOSYN) IVPB 3.375 g     3.375  g 100 mL/hr over 30 Minutes Intravenous  Once 02/08/19 1533 02/08/19 1840      Assessment/Plan: Contained perforated ulcer in cirrhotic patient  Given clinica improvement, will start clear liquid diet today   LOS: 6 days     Coralie Keens 02/14/2019

## 2019-02-14 NOTE — Progress Notes (Signed)
Patient pulled his peripheral IV again despite being wrapped by IV Team, PIV catheter is intact.  Earlier, patient pulled out his PICC line.  On-call MD was made aware.

## 2019-02-15 LAB — BASIC METABOLIC PANEL
Anion gap: 13 (ref 5–15)
BUN: 47 mg/dL — ABNORMAL HIGH (ref 6–20)
CO2: 17 mmol/L — ABNORMAL LOW (ref 22–32)
Calcium: 9.4 mg/dL (ref 8.9–10.3)
Chloride: 106 mmol/L (ref 98–111)
Creatinine, Ser: 2.33 mg/dL — ABNORMAL HIGH (ref 0.61–1.24)
GFR calc Af Amer: 34 mL/min — ABNORMAL LOW (ref >60)
GFR calc non Af Amer: 30 mL/min — ABNORMAL LOW (ref >60)
Glucose, Bld: 94 mg/dL (ref 70–99)
Potassium: 3 mmol/L — ABNORMAL LOW (ref 3.5–5.1)
Sodium: 136 mmol/L (ref 135–145)

## 2019-02-15 LAB — CBC
HCT: 26.7 % — ABNORMAL LOW (ref 39.0–52.0)
Hemoglobin: 8.7 g/dL — ABNORMAL LOW (ref 13.0–17.0)
MCH: 29.8 pg (ref 26.0–34.0)
MCHC: 32.6 g/dL (ref 30.0–36.0)
MCV: 91.4 fL (ref 80.0–100.0)
Platelets: 103 10*3/uL — ABNORMAL LOW (ref 150–400)
RBC: 2.92 MIL/uL — ABNORMAL LOW (ref 4.22–5.81)
RDW: 18.6 % — ABNORMAL HIGH (ref 11.5–15.5)
WBC: 10 10*3/uL (ref 4.0–10.5)
nRBC: 0 % (ref 0.0–0.2)

## 2019-02-15 LAB — GLUCOSE, CAPILLARY
Glucose-Capillary: 107 mg/dL — ABNORMAL HIGH (ref 70–99)
Glucose-Capillary: 96 mg/dL (ref 70–99)
Glucose-Capillary: 93 mg/dL (ref 70–99)
Glucose-Capillary: 92 mg/dL (ref 70–99)
Glucose-Capillary: 132 mg/dL — ABNORMAL HIGH (ref 70–99)
Glucose-Capillary: 143 mg/dL — ABNORMAL HIGH (ref 70–99)
Glucose-Capillary: 90 mg/dL (ref 70–99)

## 2019-02-15 LAB — PROTIME-INR
INR: 1.4 — ABNORMAL HIGH (ref 0.8–1.2)
Prothrombin Time: 17 seconds — ABNORMAL HIGH (ref 11.4–15.2)

## 2019-02-15 MED ORDER — POTASSIUM CHLORIDE CRYS ER 20 MEQ PO TBCR: 40.0000 meq | EXTENDED_RELEASE_TABLET | Freq: Once | ORAL | Status: DC

## 2019-02-15 MED ORDER — POTASSIUM CHLORIDE 10 MEQ/100ML IV SOLN
10.0000 meq | INTRAVENOUS | Status: AC
  Administered 2019-02-15 (×3): 10 meq via INTRAVENOUS
  Filled 2019-02-15 (×3): qty 100

## 2019-02-15 NOTE — Plan of Care (Signed)
  Problem: Education: Goal: Knowledge of General Education information will improve Description Including pain rating scale, medication(s)/side effects and non-pharmacologic comfort measures Outcome: Progressing   

## 2019-02-15 NOTE — Progress Notes (Signed)
PROGRESS NOTE    [name]  ZOX:[id number] DOB: 10/24/1960 DOA: 02/08/2019 PCP: Fleet ContrasAvbuere, Edwin, MD   Brief Narrative: 58 year old with past medical history significant for cirrhosis with splenomegaly, esophageal varices, hepatitis C, adrenal insufficiency, hypothyroidism, schizoaffective disorder, PTSD, GI bleed, neuropathy, chronic kidney disease presented to the emergency department due to worsening abdominal pain, distention associated with rectal bleeding for 1 day.  Work-up revealed bowel perforation pneumoperitoneum.  Surgery consulted and recommended no operative intervention due to high risk of mortality, antibiotics and bowel rest.  GI was consulted for possible paracentesis due to significant ascites as well as dark maroon stool and acute blood loss anemia.  Protonix infusion was initiated as well as octreotide tried as patient has history of esophageal varices.  Patient developed low blood pressure.  CCM was consulted.  Patient was transferred back to triad service on 02/12/2019.  Patient underwent UGI on 11/16 and he was found to have focal contained perforation in the distal stomach.  General surgery and GI recommended keep NPO.  No surgical intervention due to poor surgical candidate.   Assessment & Plan:   Principal Problem:   Bowel perforation (HCC) Active Problems:   Schizoaffective disorder (HCC)   PTSD (post-traumatic stress disorder)   History of adrenal insufficiency   GI bleed   Cirrhosis (HCC)   Esophageal varices in cirrhosis (HCC)   CKD (chronic kidney disease) stage 3, GFR 30-59 ml/min   Abdominal pain   Bleeding per rectum   Septic shock (HCC)   Central venous catheter in place   Goals of care, counseling/discussion   Palliative care by specialist   AKI (acute kidney injury) (HCC)  1-Septic Shock due to bowel perforation/pneumoperitoneum/gastric perforation: Evaluated by [facility] and deemed not to be a surgical candidate given high mortality. Patient  endoscopy with no esophageal varices. Patient was on vasopressors at one point during his stay in the ICU. He has received a couple of dose of albumin. He is currently on midodrine.  On IV Zosyn. GI series showed gastric perforation with contained pneumoperitoneum. Patient was a started on TPN on 11/16. Patient was a started on clear diet.  Complaints of abdominal pain today.  Palliative consulted again for goals of care.   2-Hystory of adrenal insufficiency: He is on 50 mg of prednisone daily  3-Cirrhosis with ascites: Meld score 23 , Child Pugh C; History of esophageal varices, HCV (treated with Epclusa in 2018, followed by liver clinic [facility]). GI following, appreciate assistance Underwent paracentesis on 11/18 with WBC more than  2000.  Continue with IV antibiotics.   Anemia with GI bleed: Due to known gastric and duodenal ulcer had EGD Oct 2020 which showed grade D esophagitis, 2cm hiatal hernia, 5cm non-bleeding gastric ulcer and non-bleeding duodenal ulcers). Continue with PPI  Acute renal failure in the setting of chronic kidney disease a stage III: Creatinine baseline 1.7--2.0 Renal ultrasound showed chronic kidney disease Nephrology has signed off Monitor creatinine.  Hyponatremia: resolved with D5 . Stop IV fluids.   Hypokalemia; replete IV  History of PTSD and schizoaffective disorder chronic pain On fentanyl Patient expressed some suicidal ideation, psychiatric was consulted.  He was cleared by psych. On as needed Haldol     Nutrition Problem: Increased nutrient needs Etiology: chronic illness(cirrhosis)    Signs/Symptoms: estimated needs    Interventions: TPN  Estimated body mass index is 27.92 kg/m as calculated from the following:   Height as of this encounter: 6\' 6"  (1.981 m).   Weight as  of this encounter: 109.6 kg.   DVT prophylaxis: SCD Code Status: Full code Family Communication: he relates he doesn't have family here, he has  cousin in [place], he has many friends.  Disposition Plan: will need safe discharge plan, he was living by himself. CM consulted.  Consultants:  General surgery GI  Procedures:   US paracentesis 02/14/2019  Antimicrobials:  Zosyn   Subjective: Alert, complaining of abdominal pain. Wants more fluid remove from abdomen.   Objective: Vitals:   02/14/19 0411 02/14/19 1257 02/14/19 2032 02/15/19 0434  BP: 120/69 122/67 122/70 110/70  Pulse: 71 63 70 67  Resp: 17 18 18 19   Temp: 98 F (36.7 C) 97.6 F (36.4 C) 97.9 F (36.6 C) 97.6 F (36.4 C)  TempSrc: Oral Oral Oral Oral  SpO2: 99% 98% 98% 96%  Weight:      Height:        Intake/Output Summary (Last 24 hours) at 02/15/2019 1344 Last data filed at 02/15/2019 1200 Gross per 24 hour  Intake 2927.43 ml  Output 525 ml  Net 2402.43 ml   Filed Weights   02/11/19 0500 02/11/19 0501 02/12/19 0136  Weight: 109.9 kg 109.9 kg 109.6 kg    Examination:  General exam: NAD  Respiratory system: CTA Cardiovascular system: S 1, S 2 RRR Gastrointestinal system: BS present, tender, distended Central nervous system: alert,  Extremities: symmetric power.  Skin: NNo rashes    Data Reviewed: I have personally reviewed following labs and imaging studies  CBC: Recent Labs  Lab 02/11/19 0345 02/12/19 0444 02/13/19 0552 02/14/19 0619 02/15/19 0319  WBC 3.9* 3.4* 3.5* 5.0 10.0  NEUTROABS  --   --  2.9 4.3  --   HGB 7.6* 8.4* 8.0* 8.3* 8.7*  HCT 23.6* 25.6* 24.5* 25.7* 26.7*  MCV 92.5 92.1 92.8 92.1 91.4  PLT 81* 77* 74* 76* 103*   Basic Metabolic Panel: Recent Labs  Lab 02/08/19 1709  02/11/19 0345 02/12/19 0444 02/13/19 0552 02/14/19 0619 02/15/19 0319  NA  --    < > 142 146* 146* 149* 136  K  --    < > 3.6 3.3* 3.4* 3.3* 3.0*  CL  --    < > 110 113* 112* 114* 106  CO2  --    < > 19* 19* 19* 19* 17*  GLUCOSE  --    < > 83 91 97 78 94  BUN  --    < > 48* 49* 55* 55* 47*  CREATININE  --    < > 2.37* 2.49* 2.59*  2.59* 2.33*  CALCIUM  --    < > 8.9 9.2 9.3 9.8 9.4  MG 1.8  --   --   --   --  2.2  --   PHOS 3.8  --   --   --   --  2.6  --    < > = values in this interval not displayed.   GFR: Estimated Creatinine Clearance: 44.7 mL/min (A) (by C-G formula based on SCr of 2.33 mg/dL (H)). Liver Function Tests: Recent Labs  Lab 02/09/19 0317 02/10/19 1111 02/11/19 0345 02/12/19 0444 02/14/19 0619  AST 16 15 15  11* 16  ALT 10 10 9 9 9   ALKPHOS 57 37* 31* 32* 36*  BILITOT 1.3* 2.0* 1.6* 2.0* 2.2*  PROT 4.3* 5.2* 5.5* 5.8* 5.2*  ALBUMIN 1.9* 3.1* 3.8 3.7 2.9*   No results for input(s): LIPASE, AMYLASE in the last 168 hours. Recent Labs  Lab  02/08/19 1709 02/10/19 1111  AMMONIA 72* 32   Coagulation Profile: Recent Labs  Lab 02/08/19 1522 02/09/19 0317 02/09/19 1611 02/10/19 1111 02/15/19 0319  INR 1.6* 1.8* 2.3* 2.0* 1.4*   Cardiac Enzymes: No results for input(s): CKTOTAL, CKMB, CKMBINDEX, TROPONINI in the last 168 hours. BNP (last 3 results) No results for input(s): PROBNP in the last 8760 hours. HbA1C: No results for input(s): HGBA1C in the last 72 hours. CBG: Recent Labs  Lab 02/15/19 0137 02/15/19 0359 02/15/19 0431 02/15/19 0801 02/15/19 1251  GLUCAP 107* 96 93 92 132*   Lipid Profile: Recent Labs    02/14/19 0620  TRIG 85   Thyroid Function Tests: No results for input(s): TSH, T4TOTAL, FREET4, T3FREE, THYROIDAB in the last 72 hours. Anemia Panel: No results for input(s): VITAMINB12, FOLATE, FERRITIN, TIBC, IRON, RETICCTPCT in the last 72 hours. Sepsis Labs: Recent Labs  Lab 02/09/19 1405 02/10/19 1111  LATICACIDVEN 3.3* 1.6    Recent Results (from the past 240 hour(s))  SARS CORONAVIRUS 2 (TAT 6-24 HRS) Nasopharyngeal Nasopharyngeal Swab     Status: None   Collection Time: 02/08/19  3:20 PM   Specimen: Nasopharyngeal Swab  Result Value Ref Range Status   SARS Coronavirus 2 NEGATIVE NEGATIVE Final    Comment: (NOTE) SARS-CoV-2 target nucleic  acids are NOT DETECTED. The SARS-CoV-2 RNA is generally detectable in upper and lower respiratory specimens during the acute phase of infection. Negative results do not preclude SARS-CoV-2 infection, do not rule out co-infections with other pathogens, and should not be used as the sole basis for treatment or other patient management decisions. Negative results must be combined with clinical observations, patient history, and epidemiological information. The expected result is Negative. Fact Sheet for Patients: HairSlick.no Fact Sheet for Healthcare Providers: quierodirigir.com This test is not yet approved or cleared by the [place] FDA and  has been authorized for detection and/or diagnosis of SARS-CoV-2 by FDA under an Emergency Use Authorization (EUA). This EUA will remain  in effect (meaning this test can be used) for the duration of the COVID-19 declaration under Section 56 4(b)(1) of the Act, 21 U.S.C. section 360bbb-3(b)(1), unless the authorization is terminated or revoked sooner. Performed at [facility], [address] [place], [place] [address]   Blood culture (routine x 2)     Status: None   Collection Time: 02/08/19  5:08 PM   Specimen: BLOOD RIGHT WRIST  Result Value Ref Range Status   Specimen Description BLOOD RIGHT WRIST  Final   Special Requests   Final    AEROBIC BOTTLE ONLY Blood Culture results may not be optimal due to an inadequate volume of blood received in culture bottles   Culture   Final    NO GROWTH 5 DAYS Performed at [facility], [address], [place], [place] [address]    Report Status 02/13/2019 FINAL  Final  Blood culture (routine x 2)     Status: None   Collection Time: 02/08/19  5:10 PM   Specimen: BLOOD RIGHT HAND  Result Value Ref Range Status   Specimen Description BLOOD RIGHT HAND  Final   Special Requests   Final    BOTTLES DRAWN AEROBIC AND ANAEROBIC Blood  Culture results may not be optimal due to an inadequate volume of blood received in culture bottles   Culture   Final    NO GROWTH 5 DAYS Performed at [facility], [address], [place], [place] [address]  Report Status 02/13/2019 FINAL  Final  MRSA PCR Screening     Status: None   Collection Time: 02/09/19  3:30 PM   Specimen: Nasopharyngeal  Result Value Ref Range Status   MRSA by PCR NEGATIVE NEGATIVE Final    Comment:        The GeneXpert MRSA Assay (FDA approved for NASAL specimens only), is one component of a comprehensive MRSA colonization surveillance program. It is not intended to diagnose MRSA infection nor to guide or monitor treatment for MRSA infections. Performed at [facility], [place] [address], [place], [place] [address]   Body fluid culture (includes gram stain)     Status: None   Collection Time: 02/09/19  5:30 PM   Specimen: Pleural Fluid  Result Value Ref Range Status   Specimen Description FLUID  Final   Special Requests NONE  Final   Gram Stain   Final    FEW WBC PRESENT, PREDOMINANTLY PMN NO ORGANISMS SEEN    Culture   Final    NO GROWTH 3 DAYS Performed at [facility] [address] [place], [place] [address]    Report Status 02/13/2019 FINAL  Final  Fungus Culture With Stain     Status: None (Preliminary result)   Collection Time: 02/09/19  5:30 PM   Specimen: Peritoneal Cavity; Pleural Fluid  Result Value Ref Range Status   Fungus Stain Final report  Final    Comment: (NOTE) Performed At: [facility] [place], [place] [id number] Rush Farmer MD ZC:[id number]    Fungus (Mycology) Culture PENDING  Incomplete   Fungal Source FLUID  Final    Comment: Performed at [facility], [place] [address], [place], [place] [address]  Fungus Culture Result     Status: None   Collection Time: 02/09/19  5:30 PM  Result Value Ref Range Status   Result 1 Comment  Final    Comment: (NOTE) KOH/Calcofluor  preparation:  no fungus observed. Performed At: [facility] [place], [place] [id number] Rush Farmer MD JG:[id number]          Radiology Studies: Ir Paracentesis  Result Date: 02/14/2019 INDICATION: Cirrhosis, ascites. Request for diagnostic and therapeutic paracentesis up to 5 L max. EXAM: ULTRASOUND GUIDED LEFT LOWER QUADRANT PARACENTESIS MEDICATIONS: None. COMPLICATIONS: None immediate. PROCEDURE: Informed written consent was obtained from the patient after a discussion of the risks, benefits and alternatives to treatment. A timeout was performed prior to the initiation of the procedure. Initial ultrasound scanning demonstrates a large amount of ascites within the left lower abdominal quadrant. The left lower abdomen was prepped and draped in the usual sterile fashion. 1% lidocaine was used for local anesthesia. Following this, a 19 gauge, 7-cm, Yueh catheter was introduced. An ultrasound image was saved for documentation purposes. The paracentesis was performed. The catheter was removed and a dressing was applied. The patient tolerated the procedure well without immediate post procedural complication. FINDINGS: A total of approximately 5 L of clear yellow fluid was removed. Samples were sent to the laboratory as requested by the clinical team. IMPRESSION: Successful ultrasound-guided paracentesis yielding 5 liters of peritoneal fluid. Read by: Ascencion Dike PA-C Electronically Signed   By: Corrie Mckusick D.O.   On: 02/14/2019 12:36        Scheduled Meds: . insulin aspart  0-9 Units Subcutaneous Q4H  . midodrine  10 mg Oral TID WC   Continuous Infusions: . sodium chloride    . sodium  chloride Stopped (02/12/19 1737)  . sodium chloride 1,000 mL (02/15/19 0811)  . pantoprozole (PROTONIX) infusion 8 mg/hr (02/15/19 1048)  . phytonadione (VITAMIN K) IV 5 mg (02/14/19 1334)  . piperacillin-tazobactam (ZOSYN)  IV 3.375 g (02/15/19 0816)     LOS: 7 days     Time spent: 35 minutes.     Alba CoryBelkys A Sydnie Sigmund, MD [facility]   If 7PM-7AM, please contact night-coverage [email or web address] Password [email or web address] 02/15/2019, 1:44 PM

## 2019-02-15 NOTE — Evaluation (Signed)
Physical Therapy Evaluation Patient Details Name: [name] MRN: [id number] DOB: [birth year] Today's Date: 02/15/2019   History of Present Illness  Pt is a 58 y/o male admitted secondary to abdominal pain, distention and rectal bleeding. Pt found to have a bowel perforation. Per surgical team, no surgery indicated as pt is a "poor surgical candidate". Paracentesis completed on 11/18 with 5L removed. PMH including but not limited to cirrhosis, hep C, schizoaffective disorder, PTSD and CKD.    Clinical Impression  Pt presented supine in bed with HOB elevated, initially lethargic but then able to arouse some for bed mobility. However, pt very hyper-focused on pain and asking for pain medicines constantly throughout. Pt's RN was notified. He was only able to tolerate bed mobility, rolling bilaterally in bed with max A x2. He was able to reposition himself in bed with min A and use of bilateral UEs on bed frame. Pt would continue to benefit from skilled physical therapy services at this time while admitted and after d/c to address the below listed limitations in order to improve overall safety and independence with functional mobility.     Follow Up Recommendations SNF    Equipment Recommendations  None recommended by PT    Recommendations for Other Services       Precautions / Restrictions Precautions Precautions: Fall Precaution Comments: rectal tube Restrictions Weight Bearing Restrictions: No      Mobility  Bed Mobility Overal bed mobility: Needs Assistance Bed Mobility: Rolling Rolling: Max assist;Total assist;+2 for physical assistance         General bed mobility comments: when assessing rolling to each side pt stating multiple times "I can't do that." Suspect decreased initiation somewhat a factor as pt obseverved to pull self up in bed using BUE on head board and min A.   Transfers                 General transfer comment: unable to assess this  session  Ambulation/Gait                Stairs            Wheelchair Mobility    Modified Rankin (Stroke Patients Only)       Balance                                             Pertinent Vitals/Pain Pain Assessment: Faces Faces Pain Scale: Hurts whole Benjamin Pain Location: abdomen Pain Descriptors / Indicators: Grimacing;Moaning;Guarding Pain Intervention(s): Monitored during session;Repositioned;Patient requesting pain meds-RN notified    Home Living Family/patient expects to be discharged to:: Private residence Living Arrangements: Alone               Additional Comments: Pt hyperfocused on abdominal pain at this time and unable to provide further information. Per chart review: family lives in [place] but he has friend support locally. He was ambulating with a cane.     Prior Function           Comments: unable to determine PLOF with ADLs. per chart review he was at least needing a cane to mobilize. Noted pt's cane amoung his personal belongings in the room.     Hand Dominance        Extremity/Trunk Assessment   Upper Extremity Assessment Upper Extremity Assessment: Generalized weakness    Lower Extremity Assessment Lower Extremity Assessment: Generalized  weakness;RLE deficits/detail RLE Deficits / Details: pt with no active movement of R LE throughout       Communication   Communication: No difficulties  Cognition Arousal/Alertness: Awake/alert;Lethargic(initially lethargic) Behavior During Therapy: Flat affect;Anxious;Impulsive;Restless;Agitated Overall Cognitive Status: Difficult to assess Area of Impairment: Following commands;Problem solving                       Following Commands: Follows one step commands inconsistently     Problem Solving: Decreased initiation;Difficulty sequencing;Requires verbal cues;Requires tactile cues General Comments: difficult to assess as pt was hyperfocused on pain and  requesting pain medicine      General Comments      Exercises     Assessment/Plan    PT Assessment Patient needs continued PT services  PT Problem List Decreased strength;Decreased range of motion;Decreased activity tolerance;Decreased balance;Decreased mobility;Decreased coordination;Decreased cognition;Decreased knowledge of use of DME;Decreased safety awareness;Decreased knowledge of precautions;Pain       PT Treatment Interventions DME instruction;Gait training;Stair training;Functional mobility training;Therapeutic activities;Therapeutic exercise;Balance training;Neuromuscular re-education;Patient/family education;Cognitive remediation;Wheelchair mobility training    PT Goals (Current goals can be found in the Care Plan section)  Acute Rehab PT Goals Patient Stated Goal: decreased pain PT Goal Formulation: Patient unable to participate in goal setting Time For Goal Achievement: 03/01/19 Potential to Achieve Goals: Fair    Frequency Min 2X/week   Barriers to discharge        Co-evaluation PT/OT/SLP Co-Evaluation/Treatment: Yes Reason for Co-Treatment: For patient/therapist safety;To address functional/ADL transfers PT goals addressed during session: Mobility/safety with mobility;Strengthening/ROM OT goals addressed during session: ADL's and self-care       AM-PAC PT "6 Clicks" Mobility  Outcome Measure Help needed turning from your back to your side while in a flat bed without using bedrails?: Total Help needed moving from lying on your back to sitting on the side of a flat bed without using bedrails?: Total Help needed moving to and from a bed to a chair (including a wheelchair)?: Total Help needed standing up from a chair using your arms (e.g., wheelchair or bedside chair)?: Total Help needed to walk in hospital room?: Total Help needed climbing 3-5 steps with a railing? : Total 6 Click Score: 6    End of Session   Activity Tolerance: Patient limited by  pain Patient left: in bed;with call bell/phone within reach;with bed alarm set Nurse Communication: Mobility status;Patient requests pain meds PT Visit Diagnosis: Other abnormalities of gait and mobility (R26.89);Pain Pain - part of body: (abdomen)    Time: 9449-6759 PT Time Calculation (min) (ACUTE ONLY): 16 min   Charges:   PT Evaluation $PT Eval Moderate Complexity: 1 Mod          Ginette Pitman, PT, DPT  Acute Rehabilitation Services Pager [phone] Office [phone]    Alessandra Bevels Ajai Harville 02/15/2019, 12:49 PM

## 2019-02-15 NOTE — Progress Notes (Signed)
   Subjective/Chief Complaint: He reports increased abdominal pain with drinking liquids Vitals normal this morning Had paracentesis yesterday with 5 liters removed  Objective: Vital signs in last 24 hours: Temp:  [97.6 F (36.4 C)-97.9 F (36.6 C)] 97.6 F (36.4 C) (11/19 0434) Pulse Rate:  [63-70] 67 (11/19 0434) Resp:  [18-19] 19 (11/19 0434) BP: (110-122)/(67-70) 110/70 (11/19 0434) SpO2:  [96 %-98 %] 96 % (11/19 0434) Last BM Date: 02/14/19  Intake/Output from previous day: 11/18 0701 - 11/19 0700 In: 4775.6 [P.O.:4340; I.V.:278.1; IV Piggyback:157.5] Out: 600 [Urine:600] Intake/Output this shift: No intake/output data recorded.  Exam: Awake and alert Abdomen soft, non-tender in the epigastrium, no peritonitis   Lab Results:  Recent Labs    02/14/19 0619 02/15/19 0319  WBC 5.0 10.0  HGB 8.3* 8.7*  HCT 25.7* 26.7*  PLT 76* 103*   BMET Recent Labs    02/14/19 0619 02/15/19 0319  NA 149* 136  K 3.3* 3.0*  CL 114* 106  CO2 19* 17*  GLUCOSE 78 94  BUN 55* 47*  CREATININE 2.59* 2.33*  CALCIUM 9.8 9.4   PT/INR Recent Labs    02/15/19 0319  LABPROT 17.0*  INR 1.4*   ABG No results for input(s): PHART, HCO3 in the last 72 hours.  Invalid input(s): PCO2, PO2  Studies/Results: Ir Paracentesis  Result Date: 02/14/2019 INDICATION: Cirrhosis, ascites. Request for diagnostic and therapeutic paracentesis up to 5 L max. EXAM: ULTRASOUND GUIDED LEFT LOWER QUADRANT PARACENTESIS MEDICATIONS: None. COMPLICATIONS: None immediate. PROCEDURE: Informed written consent was obtained from the patient after a discussion of the risks, benefits and alternatives to treatment. A timeout was performed prior to the initiation of the procedure. Initial ultrasound scanning demonstrates a large amount of ascites within the left lower abdominal quadrant. The left lower abdomen was prepped and draped in the usual sterile fashion. 1% lidocaine was used for local anesthesia.  Following this, a 19 gauge, 7-cm, Yueh catheter was introduced. An ultrasound image was saved for documentation purposes. The paracentesis was performed. The catheter was removed and a dressing was applied. The patient tolerated the procedure well without immediate post procedural complication. FINDINGS: A total of approximately 5 L of clear yellow fluid was removed. Samples were sent to the laboratory as requested by the clinical team. IMPRESSION: Successful ultrasound-guided paracentesis yielding 5 liters of peritoneal fluid. Read by: Ascencion Dike PA-C Electronically Signed   By: Corrie Mckusick D.O.   On: 02/14/2019 12:36    Anti-infectives: Anti-infectives (From admission, onward)   Start     Dose/Rate Route Frequency Ordered Stop   02/08/19 2200  piperacillin-tazobactam (ZOSYN) IVPB 3.375 g     3.375 g 12.5 mL/hr over 240 Minutes Intravenous Every 8 hours 02/08/19 1552     02/08/19 1545  piperacillin-tazobactam (ZOSYN) IVPB 3.375 g     3.375 g 100 mL/hr over 30 Minutes Intravenous  Once 02/08/19 1533 02/08/19 1840      Assessment/Plan: Contained perforated ulcer with cirrhosis  I think ulcer is slowly healing Will keep on liquids today   LOS: 7 days    [name] 02/15/2019

## 2019-02-15 NOTE — Plan of Care (Signed)
  Problem: Health Behavior/Discharge Planning: Goal: Ability to manage health-related needs will improve Outcome: Progressing   

## 2019-02-15 NOTE — Evaluation (Signed)
Occupational Therapy Evaluation Patient Details Name: [name] MRN: [id number] DOB: 05-Mar-1961 Today's Date: 02/15/2019    History of Present Illness 58 year old with past medical history significant for cirrhosis with splenomegaly, esophageal varices, hepatitis C, adrenal insufficiency, hypothyroidism, schizoaffective disorder, PTSD, GI bleed, neuropathy, chronic kidney disease presented to the emergency department due to worsening abdominal pain, distention associated with rectal bleeding for 1 day.  Work-up revealed bowel perforation pneumoperitoneum. Patient underwent UGI on 11/16 and he was found to have focal contained perforation in the distal stomach. No surgical intervention due to poor surgical candidate. Paracentesis completed 11/18 with 5 liters removed.   Clinical Impression   Pt admitted with the above diagnoses and presents with below problem list. Pt will benefit from continued acute OT to address the below listed deficits and maximize independence with basic ADLs prior to d/c to next venue.  Unclear PLOF with ADLs PTA as pt is unable to give clear,reliable history. Per chart review pt was living alone and utilized a cane for mobility. Pt limited this session by fatigue/lethargy and pain. Bed level eval. Pt was +2 max A to roll to each side in bed but noted to spontaneously scoot self up in bed largely relying on BUE on head board and minimal assist. Pt will need continued rehab at d/c in a setting that can provide 24 hour assist.      Follow Up Recommendations  SNF    Equipment Recommendations  Other (comment)(defer to next venue)    Recommendations for Other Services       Precautions / Restrictions Precautions Precautions: Fall Restrictions Weight Bearing Restrictions: No      Mobility Bed Mobility Overal bed mobility: Needs Assistance Bed Mobility: Rolling Rolling: Max assist;Total assist;+2 for physical assistance         General bed mobility comments:  when assessing rolling to each side pt stating multiple times "I can't do that." Suspect decreased initiation somewhat a factor as pt obseverved to pull self up in bed using BUE on head board and min A.   Transfers                 General transfer comment: unable to assess this session    Balance                                           ADL either performed or assessed with clinical judgement   ADL Overall ADL's : Needs assistance/impaired Eating/Feeding: Set up;Supervision/ safety;Bed level   Grooming: Bed level;Minimal assistance;Cueing for sequencing   Upper Body Bathing: Bed level;Total assistance   Lower Body Bathing: Bed level;Maximal assistance;+2 for physical assistance;Total assistance   Upper Body Dressing : Total assistance;Bed level   Lower Body Dressing: Maximal assistance;Total assistance;+2 for physical assistance;Bed level                 General ADL Comments: Bed level eval. Rolled side to side (max-total +2 A) and scooted up in bed (min A). Unable to further asses transfers due to fatigue and pain.      Vision         Perception     Praxis      Pertinent Vitals/Pain Pain Assessment: Faces Faces Pain Scale: Hurts whole lot Pain Location: abdomen with bed mobility. Pain Descriptors / Indicators: Grimacing;Moaning;Guarding Pain Intervention(s): Monitored during session;Premedicated before session;Limited activity within patient's tolerance;Repositioned;Patient requesting pain  meds-RN notified     Hand Dominance     Extremity/Trunk Assessment Upper Extremity Assessment Upper Extremity Assessment: Generalized weakness   Lower Extremity Assessment Lower Extremity Assessment: Defer to PT evaluation       Communication Communication Communication: Other (comment)(short, at times effortful verbalizations. tangential.)   Cognition Arousal/Alertness: Awake/alert;Lethargic(lethargic at start of session) Behavior During  Therapy: Flat affect;Anxious;Impulsive Overall Cognitive Status: No family/caregiver present to determine baseline cognitive functioning                                 General Comments: intellectual awareness. focused attention level. decreased intiation. difficulty following one step commands (vs behavior?). Stated multiple times during bed level eval "I can't do that" but observed to scoot self up in bed with minimal assist largely relying on BUE on head rail.    General Comments       Exercises     Shoulder Instructions      Home Living Family/patient expects to be discharged to:: Private residence Living Arrangements: Alone                               Additional Comments: Per chart review: family lives in [place] but he has friend support locally. He was ambulating with a cane.       Prior Functioning/Environment          Comments: unable to determine PLOF with ADLs. per chart review he was at least needing a cane to mobilize. Noted pt's cane amoung his personal belongings in the room.        OT Problem List: Decreased strength;Decreased activity tolerance;Impaired balance (sitting and/or standing);Decreased cognition;Decreased safety awareness;Decreased knowledge of use of DME or AE;Decreased knowledge of precautions;Pain      OT Treatment/Interventions: Self-care/ADL training;Therapeutic exercise;DME and/or AE instruction;Therapeutic activities;Cognitive remediation/compensation;Patient/family education;Balance training    OT Goals(Current goals can be found in the care plan section) Acute Rehab OT Goals Patient Stated Goal: decreased pain OT Goal Formulation: With patient Time For Goal Achievement: 03/01/19 Potential to Achieve Goals: Fair ADL Goals Pt Will Perform Grooming: bed level;with min assist Pt Will Perform Upper Body Bathing: bed level;with mod assist Pt Will Perform Lower Body Bathing: with mod assist;bed level Additional  ADL Goal #1: Pt will complete bed mobility at mod A level to prepare for EOB ADLs.  OT Frequency: Min 2X/week   Barriers to D/C:            Co-evaluation PT/OT/SLP Co-Evaluation/Treatment: Yes Reason for Co-Treatment: For patient/therapist safety;To address functional/ADL transfers;Necessary to address cognition/behavior during functional activity   OT goals addressed during session: ADL's and self-care      AM-PAC OT "6 Clicks" Daily Activity     Outcome Measure Help from another person eating meals?: A Little Help from another person taking care of personal grooming?: A Lot Help from another person toileting, which includes using toliet, bedpan, or urinal?: Total Help from another person bathing (including washing, rinsing, drying)?: Total Help from another person to put on and taking off regular upper body clothing?: Total Help from another person to put on and taking off regular lower body clothing?: Total 6 Click Score: 9   End of Session Nurse Communication: Patient requests pain meds  Activity Tolerance: Patient limited by fatigue;Patient limited by lethargy;Patient limited by pain Patient left: in bed;with call bell/phone within reach;with bed alarm set;with SCD's  reapplied  OT Visit Diagnosis: Muscle weakness (generalized) (M62.81);Pain;Other symptoms and signs involving cognitive function;Unsteadiness on feet (R26.81)                Time: 1000-1016 OT Time Calculation (min): 16 min Charges:  OT General Charges $OT Visit: 1 Visit OT Evaluation $OT Eval Moderate Complexity: [place], OT Acute Rehabilitation Services Pager: [phone] Office: [phone]   Hortencia Pilar 02/15/2019, 11:42 AM

## 2019-02-15 NOTE — Progress Notes (Addendum)
Daily Progress Note   Patient Name: [name]       Date: 02/15/2019 DOB: 09/25/1960  Age: 58 y.o. MRN#: [id number] Attending Physician: Alba Cory, MD Primary Care Physician: Fleet Contras, MD Admit Date: 02/08/2019  Reason for Consultation/Follow-up: Establishing goals of care  Subjective/GOC: While chart reviewing in hallway, patient heard moaning in pain and stating his stomach hurts. RN at bedside giving prn fentanyl and haldol.   Attempted follow-up goals of care with patient at bedside.   [name] is awake, alert but very challenging to discuss goals with. On Monday, patient acknowledged his understanding that he has a 'hole in my stomach' and 'high risk for surgery.' Today, patient is in denial with all medical conditions and continues to state "I don't have any ulcer" and "nothing wrong" with him.   He will not discuss goals of care. He is demanding that cogentin be restarted and demanding that I ask Dr. Sunnie Nielsen for repeat paracentesis today.   I question whether [name] can make medical decisions for himself. No family or friends at bedside.   Discussed with RN and updated Dr. Sunnie Nielsen via secure epic chat.   Length of Stay: 7  Current Medications: Scheduled Meds:  . insulin aspart  0-9 Units Subcutaneous Q4H  . midodrine  10 mg Oral TID WC    Continuous Infusions: . sodium chloride    . sodium chloride Stopped (02/12/19 1737)  . sodium chloride 1,000 mL (02/15/19 0811)  . pantoprozole (PROTONIX) infusion 8 mg/hr (02/15/19 1048)  . phytonadione (VITAMIN K) IV 5 mg (02/14/19 1334)  . piperacillin-tazobactam (ZOSYN)  IV 3.375 g (02/15/19 0816)  . potassium chloride 10 mEq (02/15/19 1054)    PRN Meds: Place/Maintain arterial line **AND** sodium chloride, sodium  chloride, fentaNYL (SUBLIMAZE) injection, haloperidol **OR** haloperidol lactate, haloperidol lactate, lidocaine (PF), ondansetron (ZOFRAN) IV, sodium chloride flush  Physical Exam Vitals signs and nursing note reviewed.  Constitutional:      General: He is awake.     Appearance: He is ill-appearing.  HENT:     Head: Normocephalic and atraumatic.  Cardiovascular:     Rate and Rhythm: Normal rate.  Pulmonary:     Effort: No tachypnea, accessory muscle usage or respiratory distress.  Abdominal:     General: There is distension.     Tenderness: There  is abdominal tenderness.  Skin:    General: Skin is warm and dry.  Neurological:     Mental Status: He is alert and oriented to person, place, and time.  Psychiatric:        Mood and Affect: Mood is anxious.        Behavior: Behavior is hyperactive.            Vital Signs: BP 110/70 (BP Location: Right Arm)   Pulse 67   Temp 97.6 F (36.4 C) (Oral)   Resp 19   Ht 6\' 6"  (1.981 m)   Wt 109.6 kg   SpO2 96%   BMI 27.92 kg/m  SpO2: SpO2: 96 % O2 Device: O2 Device: Room Air O2 Flow Rate:    Intake/output summary:   Intake/Output Summary (Last 24 hours) at 02/15/2019 1100 Last data filed at 02/15/2019 0725 Gross per 24 hour  Intake 2835.57 ml  Output 525 ml  Net 2310.57 ml   LBM: Last BM Date: 02/14/19 Baseline Weight: Weight: 99.8 kg Most recent weight: Weight: 109.6 kg       Palliative Assessment/Data: PPS 40%      Patient Active Problem List   Diagnosis Date Noted  . Palliative care by specialist   . AKI (acute kidney injury) (HCC)   . Goals of care, counseling/discussion   . Septic shock (HCC) 02/09/2019  . Intra-abdominal free air of unknown etiology   . Central venous catheter in place   . Abdominal pain 02/08/2019  . Bowel perforation (HCC) 02/08/2019  . Bleeding per rectum   . Acute gastric ulcer   . Duodenal ulcer   . Acute esophagitis   . Acute blood loss anemia   . Hematemesis 01/08/2019  .  Prolonged QT interval 01/08/2019  . Esophageal varices in cirrhosis (HCC) 01/08/2019  . CKD (chronic kidney disease) stage 3, GFR 30-59 ml/min 01/08/2019  . Suicidal ideation 01/08/2019  . Closed displaced fracture of neck of left fifth metacarpal bone 01/08/2019  . GI bleed 08/07/2018  . Acute abdominal pain   . Cirrhosis (HCC)   . Generalized abdominal pain   . Hematemesis with nausea   . Evaluation by psychiatric service required   . Acute upper gastrointestinal bleeding 10/15/2017  . PTSD (post-traumatic stress disorder)   . Hypothyroidism   . History of adrenal insufficiency   . H/O diabetes insipidus   . Schizoaffective disorder (HCC) 11/08/2011  . Personality disorder (HCC)   . Normocytic anemia   . Ascites   . Liver disease   . Hepatitis C   . Kidney disease   . 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 Surgery was consulted but patient was not felt to be a surgical candidate due to the high risk from comorbidities. He has been treated conservatively. Palliative care was consulted to help address goals.  Assessment: Septic shock Bowel perforation/pneumonperitoneum/gastric perforation Decompensated liver cirrhosis Recurrent ascites Anemia with GIB ARF with CKD III Hx of depression, PTSD, schizoaffective disorder Hx of chronic pain  Recommendations/Plan:  Continue FULL code/FULL scope treatment.  On 11/16, patient NOT interested in documenting POA and tells NP he will make his own  medical decisions.   Patient in  denial today and does not wish to discuss goals of care. He continuously states "I don't have any ulcer" and "nothing wrong." I question patients ability to make medical decisions for himself. Recommend psych follow-up for medication management and capacity evaluation prior to further palliative discussions.   Patient was living home prior to admission. Will likely need SNF placement.   Goals of Care and Additional Recommendations:  Limitations on Scope of Treatment: Full Scope Treatment  Code Status: FULL   Code Status Orders  (From admission, onward)         Start     Ordered   02/08/19 1552  Full code  Continuous     02/08/19 1553        Code Status History    Date Active Date Inactive Code Status Order ID Comments User Context   01/08/2019 1118 01/11/2019 2224 Full Code [id number]  Norval Morton, MD ED   08/07/2018 0935 08/08/2018 1329 Full Code [id number]  Georgette Shell, MD ED   10/15/2017 0943 10/18/2017 1813 Full Code [id number]  Kipp Brood, MD ED   01/17/2013 1534 01/23/2013 1730 Full Code [id number]  Illene Labrador, PA-C ED   12/30/2011 0256 12/31/2011 1312 Full Code [id number]  Christa See, RN Inpatient   12/24/2011 1816 12/30/2011 0256 Full Code [id number]  Rhunette Croft, MD ED   12/23/2011 1522 12/24/2011 1107 Full Code [id number]  Virgel Manifold, MD ED   11/08/2011 0008 11/08/2011 2324 Full Code [id number]  Janice Norrie, MD ED   Advance Care Planning Activity       Prognosis:   Poor prognosis  Discharge Planning:  To Be Determined  Care plan was discussed with patient, RN, Dr. Tyrell Antonio  Thank you for allowing the Palliative Medicine Team to assist in the care of this patient.   Time In: 1040- Time Out: 1115 Total Time 35 Prolonged Time Billed no      Greater than 50%  of this time was spent counseling and coordinating care related to the above assessment and plan.  Ihor Dow, DNP, FNP-C Palliative Medicine Team  Phone: [phone] Fax:  [phone]  Please contact Palliative Medicine Team phone at [phone] for questions and concerns.

## 2019-02-16 LAB — CBC
HCT: 32.4 % — ABNORMAL LOW (ref 39.0–52.0)
Hemoglobin: 10.5 g/dL — ABNORMAL LOW (ref 13.0–17.0)
MCH: 29.5 pg (ref 26.0–34.0)
MCHC: 32.4 g/dL (ref 30.0–36.0)
MCV: 91 fL (ref 80.0–100.0)
Platelets: 198 10*3/uL (ref 150–400)
RBC: 3.56 MIL/uL — ABNORMAL LOW (ref 4.22–5.81)
RDW: 18.6 % — ABNORMAL HIGH (ref 11.5–15.5)
WBC: 31.2 10*3/uL — ABNORMAL HIGH (ref 4.0–10.5)
nRBC: 0 % (ref 0.0–0.2)

## 2019-02-16 LAB — GLUCOSE, CAPILLARY
Glucose-Capillary: 93 mg/dL (ref 70–99)
Glucose-Capillary: 113 mg/dL — ABNORMAL HIGH (ref 70–99)
Glucose-Capillary: 100 mg/dL — ABNORMAL HIGH (ref 70–99)
Glucose-Capillary: 117 mg/dL — ABNORMAL HIGH (ref 70–99)

## 2019-02-16 LAB — BASIC METABOLIC PANEL
Anion gap: 15 (ref 5–15)
BUN: 46 mg/dL — ABNORMAL HIGH (ref 6–20)
CO2: 16 mmol/L — ABNORMAL LOW (ref 22–32)
Calcium: 9.5 mg/dL (ref 8.9–10.3)
Chloride: 104 mmol/L (ref 98–111)
Creatinine, Ser: 2.57 mg/dL — ABNORMAL HIGH (ref 0.61–1.24)
GFR calc Af Amer: 31 mL/min — ABNORMAL LOW (ref >60)
GFR calc non Af Amer: 26 mL/min — ABNORMAL LOW (ref >60)
Glucose, Bld: 105 mg/dL — ABNORMAL HIGH (ref 70–99)
Potassium: 3.2 mmol/L — ABNORMAL LOW (ref 3.5–5.1)
Sodium: 135 mmol/L (ref 135–145)

## 2019-02-16 LAB — PATHOLOGIST SMEAR REVIEW

## 2019-02-16 MED ORDER — POTASSIUM CHLORIDE 10 MEQ/100ML IV SOLN: 10.0000 meq | INTRAVENOUS | Status: DC

## 2019-02-16 MED ORDER — POTASSIUM CHLORIDE 10 MEQ/100ML IV SOLN
10.0000 meq | INTRAVENOUS | Status: AC
  Administered 2019-02-16 (×4): 10 meq via INTRAVENOUS
  Filled 2019-02-16 (×4): qty 100

## 2019-02-16 MED ORDER — HYDROCORTISONE NA SUCCINATE PF 100 MG IJ SOLR
50.0000 mg | Freq: Two times a day (BID) | INTRAMUSCULAR | Status: DC
  Administered 2019-02-16 – 2019-02-17 (×2): 50 mg via INTRAVENOUS
  Filled 2019-02-16 (×2): qty 2

## 2019-02-16 MED ORDER — SODIUM BICARBONATE-DEXTROSE 150-5 MEQ/L-% IV SOLN: 150.0000 meq | INTRAVENOUS | Status: DC

## 2019-02-16 MED ORDER — BENZTROPINE MESYLATE 0.5 MG PO TABS
0.5000 mg | ORAL_TABLET | Freq: Every day | ORAL | Status: DC
  Administered 2019-02-16 – 2019-02-17 (×2): 0.5 mg via ORAL
  Filled 2019-02-16 (×2): qty 1

## 2019-02-16 MED ORDER — PANTOPRAZOLE SODIUM 40 MG IV SOLR
40.0000 mg | Freq: Two times a day (BID) | INTRAVENOUS | Status: DC
  Administered 2019-02-17 – 2019-03-06 (×35): 40 mg via INTRAVENOUS
  Filled 2019-02-16 (×36): qty 40

## 2019-02-16 MED ORDER — SODIUM BICARBONATE-DEXTROSE 150-5 MEQ/L-% IV SOLN
150.0000 meq | INTRAVENOUS | Status: AC
  Administered 2019-02-16: 150 meq via INTRAVENOUS
  Filled 2019-02-16: qty 1000

## 2019-02-16 NOTE — Plan of Care (Signed)
  Problem: Health Behavior/Discharge Planning: Goal: Ability to manage health-related needs will improve Outcome: Progressing   

## 2019-02-16 NOTE — Progress Notes (Addendum)
   Subjective/Chief Complaint: Complains of increasing pressure from ascites Tolerating po   Objective: Vital signs in last 24 hours: Temp:  [98.1 F (36.7 C)-98.4 F (36.9 C)] 98.1 F (36.7 C) (11/20 0449) Pulse Rate:  [66-82] 82 (11/20 0449) Resp:  [17-18] 18 (11/20 0449) BP: (114-137)/(74-80) 137/80 (11/20 0449) SpO2:  [96 %-100 %] 97 % (11/20 0449) Last BM Date: 02/15/19  Intake/Output from previous day: 11/19 0701 - 11/20 0700 In: 524.6 [I.V.:139.5; IV Piggyback:385.1] Out: 800 [Urine:800] Intake/Output this shift: No intake/output data recorded.  Exam: Awake and alert Abdomen distended, non-tender  Lab Results:  Recent Labs    02/14/19 0619 02/15/19 0319  WBC 5.0 10.0  HGB 8.3* 8.7*  HCT 25.7* 26.7*  PLT 76* 103*   BMET Recent Labs    02/14/19 0619 02/15/19 0319  NA 149* 136  K 3.3* 3.0*  CL 114* 106  CO2 19* 17*  GLUCOSE 78 94  BUN 55* 47*  CREATININE 2.59* 2.33*  CALCIUM 9.8 9.4   PT/INR Recent Labs    02/15/19 0319  LABPROT 17.0*  INR 1.4*   ABG No results for input(s): PHART, HCO3 in the last 72 hours.  Invalid input(s): PCO2, PO2  Studies/Results: Ir Paracentesis  Result Date: 02/14/2019 INDICATION: Cirrhosis, ascites. Request for diagnostic and therapeutic paracentesis up to 5 L max. EXAM: ULTRASOUND GUIDED LEFT LOWER QUADRANT PARACENTESIS MEDICATIONS: None. COMPLICATIONS: None immediate. PROCEDURE: Informed written consent was obtained from the patient after a discussion of the risks, benefits and alternatives to treatment. A timeout was performed prior to the initiation of the procedure. Initial ultrasound scanning demonstrates a large amount of ascites within the left lower abdominal quadrant. The left lower abdomen was prepped and draped in the usual sterile fashion. 1% lidocaine was used for local anesthesia. Following this, a 19 gauge, 7-cm, Yueh catheter was introduced. An ultrasound image was saved for documentation purposes.  The paracentesis was performed. The catheter was removed and a dressing was applied. The patient tolerated the procedure well without immediate post procedural complication. FINDINGS: A total of approximately 5 L of clear yellow fluid was removed. Samples were sent to the laboratory as requested by the clinical team. IMPRESSION: Successful ultrasound-guided paracentesis yielding 5 liters of peritoneal fluid. Read by: Ascencion Dike PA-C Electronically Signed   By: Corrie Mckusick D.O.   On: 02/14/2019 12:36    Anti-infectives: Anti-infectives (From admission, onward)   Start     Dose/Rate Route Frequency Ordered Stop   02/08/19 2200  piperacillin-tazobactam (ZOSYN) IVPB 3.375 g     3.375 g 12.5 mL/hr over 240 Minutes Intravenous Every 8 hours 02/08/19 1552     02/08/19 1545  piperacillin-tazobactam (ZOSYN) IVPB 3.375 g     3.375 g 100 mL/hr over 30 Minutes Intravenous  Once 02/08/19 1533 02/08/19 1840      Assessment/Plan: Contained perforated pyloric ulcer with cirrhosis  From a perforation standpoint, he is now 8 days from presentation.  His abdomen remains benign from that standpoint. His issue remains his cirrhosis and ascites  From a surgical standpoint, we will advance his diet as tolerate.    Addendum:  His WBC went from normal to 31k.  No peritonitis on exam.  If up tomorrow, he may need a CT to see if he has an abscess  LOS: 8 days    Coralie Keens 02/16/2019

## 2019-02-16 NOTE — Progress Notes (Signed)
Patient refused blood draw this morning, talked to him and explained the importance of the blood work but continues to refuse.

## 2019-02-16 NOTE — Progress Notes (Addendum)
PROGRESS NOTE    [name]  AVW:[id number] DOB: 11/07/1960 DOA: 02/08/2019 PCP: Fleet ContrasAvbuere, Edwin, MD   Brief Narrative: 58 year old with past medical history significant for cirrhosis with splenomegaly, esophageal varices, hepatitis C, adrenal insufficiency, hypothyroidism, schizoaffective disorder, PTSD, GI bleed, neuropathy, chronic kidney disease presented to the emergency department due to worsening abdominal pain, distention associated with rectal bleeding for 1 day.  Work-up revealed bowel perforation pneumoperitoneum.  Surgery consulted and recommended no operative intervention due to high risk of mortality, antibiotics and bowel rest.  GI was consulted for possible paracentesis due to significant ascites as well as dark maroon stool and acute blood loss anemia.  Protonix infusion was initiated as well as octreotide tried as patient has history of esophageal varices.  Patient developed low blood pressure.  CCM was consulted.  Patient was transferred back to triad service on 02/12/2019.  Patient underwent UGI on 11/16 and he was found to have focal contained perforation in the distal stomach.  General surgery and GI recommended keep NPO.  No surgical intervention due to poor surgical candidate.   Assessment & Plan:   Principal Problem:   Bowel perforation (HCC) Active Problems:   Schizoaffective disorder (HCC)   PTSD (post-traumatic stress disorder)   History of adrenal insufficiency   GI bleed   Cirrhosis (HCC)   Esophageal varices in cirrhosis (HCC)   CKD (chronic kidney disease) stage 3, GFR 30-59 ml/min   Abdominal pain   Bleeding per rectum   Septic shock (HCC)   Central venous catheter in place   Goals of care, counseling/discussion   Palliative care by specialist   AKI (acute kidney injury) (HCC)  1-Septic Shock due to bowel perforation/pneumoperitoneum/gastric perforation: Evaluated by [facility] and deemed not to be a surgical candidate given high mortality. Patient  endoscopy with no esophageal varices. Patient was on vasopressors at one point during his stay in the ICU. He has received a couple of dose of albumin. He is currently on midodrine.  On IV Zosyn. GI series showed gastric perforation with contained pneumoperitoneum. Patient was a started on TPN on 11/16. Patient was a started on clear diet.  Palliative consulted again for goals of care, recommended psych consult for capacity evaluation.  He is still complaining of abdominal pain. He had 5 L remove two days ago. He is sleepy just received fentanyl.  Completed protonix gtt. Start IV Protonix BID.  WBC increase today, monitor, continue IV zosyn.   2-Hystory of adrenal insufficiency: He is on 50 mg of prednisone daily out patient.  Resume hydrocortisone low dose.   3-Cirrhosis with ascites: Meld score 23 , Child Pugh C; History of esophageal varices, HCV (treated with Epclusa in 2018, followed by liver clinic at [facility]). GI following, appreciate assistance Underwent paracentesis on 11/18 with WBC more than  2000. Body fluid no growth to date.  Continue with IV antibiotics.   Anemia with GI bleed: Due to known gastric and duodenal ulcer had EGD Oct 2020 which showed grade D esophagitis, 2cm hiatal hernia, 5cm non-bleeding gastric ulcer and non-bleeding duodenal ulcers). Continue with PPI  Acute renal failure in the setting of chronic kidney disease a stage III: Creatinine baseline 1.7--2.0 Renal ultrasound showed chronic kidney disease Nephrology has signed off Monitor creatinine.  Hyponatremia: resolved with D5 . Stop IV fluids.   Hypokalemia; replete IV again today   History of PTSD and schizoaffective disorder chronic pain On fentanyl Patient expressed some suicidal ideation, psychiatric was consulted.  He was cleared by psych.  On as needed Haldol  Will add cogentin, he is getting stiff. Await psych rec Metabolic acidosis;  Will start bicarb gtt for few hours.  Avoid  sodium bicarb to avoid volume overload.    Nutrition Problem: Increased nutrient needs Etiology: chronic illness(cirrhosis)    Signs/Symptoms: estimated needs    Interventions: TPN  Estimated body mass index is 27.92 kg/m as calculated from the following:   Height as of this encounter: 6\' 6"  (1.981 m).   Weight as of this encounter: 109.6 kg.   DVT prophylaxis: SCD Code Status: Full code Family Communication: he relates he doesn't have family here, he has cousin in [place], he has many friends.  Disposition Plan: will need safe discharge plan, he was living by himself. CM consulted.  Consultants:  General surgery GI  Procedures:   Hostomice pod Brdy paracentesis 02/14/2019  Antimicrobials:  Zosyn   Subjective: Sleepy, just received fentanyl for abdominal pain.   Objective: Vitals:   02/15/19 0434 02/15/19 1448 02/15/19 2010 02/16/19 0449  BP: 110/70 124/74 114/74 137/80  Pulse: 67 66 66 82  Resp: 19  17 18   Temp: 97.6 F (36.4 C) 98.4 F (36.9 C) 98.4 F (36.9 C) 98.1 F (36.7 C)  TempSrc: Oral Oral Oral Oral  SpO2: 96% 100% 96% 97%  Weight:      Height:        Intake/Output Summary (Last 24 hours) at 02/16/2019 1358 Last data filed at 02/16/2019 02/18/2019 Gross per 24 hour  Intake 865.97 ml  Output 700 ml  Net 165.97 ml   Filed Weights   02/11/19 0500 02/11/19 0501 02/12/19 0136  Weight: 109.9 kg 109.9 kg 109.6 kg    Examination:  General exam: NAD Respiratory system: CTA Cardiovascular system: S 1, S 2 RRR Gastrointestinal system: Bs present, distend, tender,  Central nervous system: Sleepy.  Extremities: Symmetric power.  Skin: No rashes.     Data Reviewed: I have personally reviewed following labs and imaging studies  CBC: Recent Labs  Lab 02/12/19 0444 02/13/19 0552 02/14/19 0619 02/15/19 0319 02/16/19 1141  WBC 3.4* 3.5* 5.0 10.0 31.2*  NEUTROABS  --  2.9 4.3  --   --   HGB 8.4* 8.0* 8.3* 8.7* 10.5*  HCT 25.6* 24.5* 25.7* 26.7* 32.4*  MCV  92.1 92.8 92.1 91.4 91.0  PLT 77* 74* 76* 103* 198   Basic Metabolic Panel: Recent Labs  Lab 02/12/19 0444 02/13/19 0552 02/14/19 0619 02/15/19 0319 02/16/19 1141  NA 146* 146* 149* 136 135  K 3.3* 3.4* 3.3* 3.0* 3.2*  CL 113* 112* 114* 106 104  CO2 19* 19* 19* 17* 16*  GLUCOSE 91 97 78 94 105*  BUN 49* 55* 55* 47* 46*  CREATININE 2.49* 2.59* 2.59* 2.33* 2.57*  CALCIUM 9.2 9.3 9.8 9.4 9.5  MG  --   --  2.2  --   --   PHOS  --   --  2.6  --   --    GFR: Estimated Creatinine Clearance: 40.5 mL/min (A) (by C-G formula based on SCr of 2.57 mg/dL (H)). Liver Function Tests: Recent Labs  Lab 02/10/19 1111 02/11/19 0345 02/12/19 0444 02/14/19 0619  AST 15 15 11* 16  ALT 10 9 9 9   ALKPHOS 37* 31* 32* 36*  BILITOT 2.0* 1.6* 2.0* 2.2*  PROT 5.2* 5.5* 5.8* 5.2*  ALBUMIN 3.1* 3.8 3.7 2.9*   No results for input(s): LIPASE, AMYLASE in the last 168 hours. Recent Labs  Lab 02/10/19 1111  AMMONIA 32  Coagulation Profile: Recent Labs  Lab 02/09/19 1611 02/10/19 1111 02/15/19 0319  INR 2.3* 2.0* 1.4*   Cardiac Enzymes: No results for input(s): CKTOTAL, CKMB, CKMBINDEX, TROPONINI in the last 168 hours. BNP (last 3 results) No results for input(s): PROBNP in the last 8760 hours. HbA1C: No results for input(s): HGBA1C in the last 72 hours. CBG: Recent Labs  Lab 02/15/19 1251 02/15/19 1635 02/15/19 2007 02/16/19 0800 02/16/19 1206  GLUCAP 132* 143* 90 93 113*   Lipid Profile: Recent Labs    02/14/19 0620  TRIG 85   Thyroid Function Tests: No results for input(s): TSH, T4TOTAL, FREET4, T3FREE, THYROIDAB in the last 72 hours. Anemia Panel: No results for input(s): VITAMINB12, FOLATE, FERRITIN, TIBC, IRON, RETICCTPCT in the last 72 hours. Sepsis Labs: Recent Labs  Lab 02/09/19 1405 02/10/19 1111  LATICACIDVEN 3.3* 1.6    Recent Results (from the past 240 hour(s))  SARS CORONAVIRUS 2 (TAT 6-24 HRS) Nasopharyngeal Nasopharyngeal Swab     Status: None    Collection Time: 02/08/19  3:20 PM   Specimen: Nasopharyngeal Swab  Result Value Ref Range Status   SARS Coronavirus 2 NEGATIVE NEGATIVE Final    Comment: (NOTE) SARS-CoV-2 target nucleic acids are NOT DETECTED. The SARS-CoV-2 RNA is generally detectable in upper and lower respiratory specimens during the acute phase of infection. Negative results do not preclude SARS-CoV-2 infection, do not rule out co-infections with other pathogens, and should not be used as the sole basis for treatment or other patient management decisions. Negative results must be combined with clinical observations, patient history, and epidemiological information. The expected result is Negative. Fact Sheet for Patients: SugarRoll.be Fact Sheet for Healthcare Providers: https://www.woods-mathews.com/ This test is not yet approved or cleared by the [place] FDA and  has been authorized for detection and/or diagnosis of SARS-CoV-2 by FDA under an Emergency Use Authorization (EUA). This EUA will remain  in effect (meaning this test can be used) for the duration of the COVID-19 declaration under Section 56 4(b)(1) of the Act, 21 U.S.C. section 360bbb-3(b)(1), unless the authorization is terminated or revoked sooner. Performed at [facility], [employer] [address] [place], [place] [address]   Blood culture (routine x 2)     Status: None   Collection Time: 02/08/19  5:08 PM   Specimen: BLOOD RIGHT WRIST  Result Value Ref Range Status   Specimen Description BLOOD RIGHT WRIST  Final   Special Requests   Final    AEROBIC BOTTLE ONLY Blood Culture results may not be optimal due to an inadequate volume of blood received in culture bottles   Culture   Final    NO GROWTH 5 DAYS Performed at [facility], [place] [address] [place], [place] [address]    Report Status 02/13/2019 FINAL  Final  Blood culture (routine x 2)     Status: None   Collection Time: 02/08/19   5:10 PM   Specimen: BLOOD RIGHT HAND  Result Value Ref Range Status   Specimen Description BLOOD RIGHT HAND  Final   Special Requests   Final    BOTTLES DRAWN AEROBIC AND ANAEROBIC Blood Culture results may not be optimal due to an inadequate volume of blood received in culture bottles   Culture   Final    NO GROWTH 5 DAYS Performed at [facility], [place] [address] [place], [place] [address]    Report Status 02/13/2019 FINAL  Final  MRSA PCR Screening     Status:  None   Collection Time: 02/09/19  3:30 PM   Specimen: Nasopharyngeal  Result Value Ref Range Status   MRSA by PCR NEGATIVE NEGATIVE Final    Comment:        The GeneXpert MRSA Assay (FDA approved for NASAL specimens only), is one component of a comprehensive MRSA colonization surveillance program. It is not intended to diagnose MRSA infection nor to guide or monitor treatment for MRSA infections. Performed at [facility], [address] [place], [place] [address]   Body fluid culture (includes gram stain)     Status: None   Collection Time: 02/09/19  5:30 PM   Specimen: Pleural Fluid  Result Value Ref Range Status   Specimen Description FLUID  Final   Special Requests NONE  Final   Gram Stain   Final    FEW WBC PRESENT, PREDOMINANTLY PMN NO ORGANISMS SEEN    Culture   Final    NO GROWTH 3 DAYS Performed at [facility], [address], [place], [place] [address]    Report Status 02/13/2019 FINAL  Final  Fungus Culture With Stain     Status: None (Preliminary result)   Collection Time: 02/09/19  5:30 PM   Specimen: Peritoneal Cavity; Pleural Fluid  Result Value Ref Range Status   Fungus Stain Final report  Final    Comment: (NOTE) Performed At: [facility] [address] [place], [place] [id number] Jolene Schimke MD OR:[id number]    Fungus (Mycology) Culture PENDING  Incomplete   Fungal Source FLUID  Final    Comment: Performed at [facility], [address],  [place], [place] [address]  Fungus Culture Result     Status: None   Collection Time: 02/09/19  5:30 PM  Result Value Ref Range Status   Result 1 Comment  Final    Comment: (NOTE) KOH/Calcofluor preparation:  no fungus observed. Performed At: [facility] [address] [place], [place] [id number] Jolene Schimke MD UY:[id number]          Radiology Studies: No results found.      Scheduled Meds: . insulin aspart  0-9 Units Subcutaneous Q4H  . midodrine  10 mg Oral TID WC   Continuous Infusions: . sodium chloride    . sodium chloride Stopped (02/12/19 1737)  . sodium chloride 1,000 mL (02/15/19 0811)  . piperacillin-tazobactam (ZOSYN)  IV 3.375 g (02/16/19 0831)  . potassium chloride       LOS: 8 days    Time spent: 35 minutes.     Alba Cory, MD [facility]   If 7PM-7AM, please contact night-coverage [email or web address] Password [email or web address] 02/16/2019, 1:58 PM

## 2019-02-17 ENCOUNTER — Inpatient Hospital Stay (HOSPITAL_COMMUNITY): Payer: Medicaid Other

## 2019-02-17 LAB — CBC
HCT: 30.2 % — ABNORMAL LOW (ref 39.0–52.0)
Hemoglobin: 10 g/dL — ABNORMAL LOW (ref 13.0–17.0)
MCH: 29.9 pg (ref 26.0–34.0)
MCHC: 33.1 g/dL (ref 30.0–36.0)
MCV: 90.4 fL (ref 80.0–100.0)
Platelets: 194 10*3/uL (ref 150–400)
RBC: 3.34 MIL/uL — ABNORMAL LOW (ref 4.22–5.81)
RDW: 18.5 % — ABNORMAL HIGH (ref 11.5–15.5)
WBC: 30.9 10*3/uL — ABNORMAL HIGH (ref 4.0–10.5)
nRBC: 0 % (ref 0.0–0.2)

## 2019-02-17 LAB — GLUCOSE, CAPILLARY
Glucose-Capillary: 106 mg/dL — ABNORMAL HIGH (ref 70–99)
Glucose-Capillary: 106 mg/dL — ABNORMAL HIGH (ref 70–99)

## 2019-02-17 LAB — BASIC METABOLIC PANEL
Anion gap: 12 (ref 5–15)
BUN: 46 mg/dL — ABNORMAL HIGH (ref 6–20)
CO2: 18 mmol/L — ABNORMAL LOW (ref 22–32)
Calcium: 9.6 mg/dL (ref 8.9–10.3)
Chloride: 103 mmol/L (ref 98–111)
Creatinine, Ser: 2.71 mg/dL — ABNORMAL HIGH (ref 0.61–1.24)
GFR calc Af Amer: 29 mL/min — ABNORMAL LOW (ref >60)
GFR calc non Af Amer: 25 mL/min — ABNORMAL LOW (ref >60)
Glucose, Bld: 121 mg/dL — ABNORMAL HIGH (ref 70–99)
Potassium: 3.9 mmol/L (ref 3.5–5.1)
Sodium: 133 mmol/L — ABNORMAL LOW (ref 135–145)

## 2019-02-17 MED ORDER — HYDROCORTISONE NA SUCCINATE PF 100 MG IJ SOLR
25.0000 mg | Freq: Two times a day (BID) | INTRAMUSCULAR | Status: DC
  Administered 2019-02-17 – 2019-03-15 (×53): 25 mg via INTRAVENOUS
  Filled 2019-02-17 (×53): qty 2

## 2019-02-17 MED ORDER — BENZTROPINE MESYLATE 1 MG/ML IJ SOLN
0.5000 mg | Freq: Every day | INTRAMUSCULAR | Status: DC
  Administered 2019-02-17 – 2019-03-27 (×37): 0.5 mg via INTRAMUSCULAR
  Filled 2019-02-17 (×42): qty 0.5

## 2019-02-17 NOTE — Consult Note (Addendum)
11:53NP spoke to RN awaiting iPad to complete psychiatric assessment.   12:28 NP called to complete tele-assesment RN [place]. reported patient is " off the floor for CT scan. "  Will will make follow-up attempt.

## 2019-02-17 NOTE — Progress Notes (Signed)
Patient had been refusing blood sugar checks.

## 2019-02-17 NOTE — Consult Note (Signed)
Telepsych Consultation   Reason for Consult:  Schizoaffective Disorder Referring Physician:  Attending MD Location of Patient:  [id number] Location of Provider: [facility]  Patient Identification: [name] MRN:  [id number] Principal Diagnosis: Bowel perforation [facility](HCC) Diagnosis:  Principal Problem:   Bowel perforation (HCC) Active Problems:   Schizoaffective disorder (HCC)   PTSD (post-traumatic stress disorder)   History of adrenal insufficiency   GI bleed   Cirrhosis (HCC)   Esophageal varices in cirrhosis (HCC)   CKD (chronic kidney disease) stage 3, GFR 30-59 ml/min   Abdominal pain   Bleeding per rectum   Septic shock (HCC)   Central venous catheter in place   Goals of care, counseling/discussion   Palliative care by specialist   AKI (acute kidney injury) (HCC)   Total Time spent with patient: 15 minutes  Subjective:   Benjamin Pulsednan Appleby is a 58 y.o. male was seen via the teleassessment.  He is denying suicidal or homicidal ideations.  Denies auditory or visual hallucinations.  Denies paranoia ideations.Patient is awake, alert and oriented to self and place.   Patient has requested to be restarted on oxcycodone. Tayte reports the reason for his admission is to be restarted on "oxcycodone". RN reported slight agitation this morning. However, overall as stated by RN patient's mood and behavior has improved.   Per nursing staff patient is scheduled as needed Haldol IV. will follow-up with attending for medication recommendations for Haldol 2 mg twice daily standing. Patient was cleared by psychiatry on 02/13/2019.  NP reached out to Attending physician Regalado who stated this consult was for capacity however unsure specific capacity concerns. Physician Regalado stated that is consult was placed by palliative care team.  NP will attempt to follow-up.  Case staffed with attending psychiatrist MD Jola Babinskilary.  Patient is cleared by psychiatry. Support, encouragement and  reassurance was provided.    Past Psychiatric History:  Risk to Self:   Risk to Others:   Prior Inpatient Therapy:   Prior Outpatient Therapy:    Past Medical History:  Past Medical History:  Diagnosis Date  . Anemia   . Ascites   . Chronic leg pain   . Cirrhosis (HCC)   . DU (perforated duodenal ulcer) (HCC) 02/13/2019   sealed perforated duodenal ulcer  . GERD (gastroesophageal reflux disease)   . H/O diabetes insipidus   . Hepatitis C    Completed therapy with [facility] ~ 2018.  suspect therapy over[facility] [place]  . History of adrenal insufficiency   . History of ARDS   . History of blood transfusion   . Hypothyroidism   . Kidney disease    stage 3  . Neuropathy   . Overdose 12/2009  . Peripheral neuropathy   . Personality disorder (HCC)   . PTSD (post-traumatic stress disorder)    [place]COND[place] IN [place]  . Schizoaffective disorder [facility](HCC)     Past Surgical History:  Procedure Laterality Date  . BIOPSY  01/10/2019   Procedure: BIOPSY;  Surgeon: Beverley FiedlerPyrtle, Jay M, MD;  [facility];  Service: Endoscopy;;  . CERVICAL SPINE SURGERY  unknown  . ESOPHAGOGASTRODUODENOSCOPY (EGD) WITH PROPOFOL N/A 01/10/2019   Procedure: ESOPHAGOGASTRODUODENOSCOPY (EGD) WITH PROPOFOL;  Surgeon: Beverley FiedlerPyrtle, Jay M, MD;  Location: [facility];  Service: Endoscopy;  Laterality: N/A;  . IR PARACENTESIS  02/14/2019  . RIGHT HIP SURGERY  1996  . US GUIDED LEFT THORACENTESIS  01/21/2010   Family History:  Family History  Problem Relation  Age of Onset  . Cancer Mother   . Heart attack Father    Family Psychiatric  History:  Social History:  Social History   Substance and Sexual Activity  Alcohol Use No   Comment: Pt denies      Social History   Substance and Sexual Activity  Drug Use No   Comment: Pt denies    Social History   Socioeconomic History  . Marital status: Single    Spouse name: Not on file  . Number of children: 0  .  Years of education: 1712  . Highest education level: Not on file  Occupational History    Comment: umemployed  Social Needs  . Financial resource strain: Not on file  . Food insecurity    Worry: Not on file    Inability: Not on file  . Transportation needs    Medical: Not on file    Non-medical: Not on file  Tobacco Use  . Smoking status: Current Every Day Smoker    Packs/day: 1.00    Years: 25.00    Pack years: 25.00    Types: Cigarettes  . Smokeless tobacco: Never Used  Substance and Sexual Activity  . Alcohol use: No    Comment: Pt denies   . Drug use: No    Comment: Pt denies  . Sexual activity: Not Currently  Lifestyle  . Physical activity    Days per week: Not on file    Minutes per session: Not on file  . Stress: Not on file  Relationships  . Social Musicianconnections    Talks on phone: Not on file    Gets together: Not on file    Attends religious service: Not on file    Active member of club or organization: Not on file    Attends meetings of clubs or organizations: Not on file    Relationship status: Not on file  Other Topics Concern  . Not on file  Social History Narrative   Single, Lives with mother   [place] from [place] during[place]after [place]   Smoker, no drugs, denies recent EtOH   Unemployed   Medicaid [employer]   Additional Social History:    Allergies:  No Known Allergies  Labs:  Results for orders placed or performed during the hospital encounter of 02/08/19 (from the past 48 hour(s))  Glucose, capillary     Status: Abnormal   Collection Time: 02/15/19  4:35 PM  Result Value Ref Range   Glucose-Capillary 143 (H) 70 - 99 mg/dL  Glucose, capillary     Status: None   Collection Time: 02/15/19  8:07 PM  Result Value Ref Range   Glucose-Capillary 90 70 - 99 mg/dL  Glucose, capillary     Status: None   Collection Time: 02/16/19  8:00 AM  Result Value Ref Range   Glucose-Capillary 93 70 - 99 mg/dL  CBC     Status: Abnormal    Collection Time: 02/16/19 11:41 AM  Result Value Ref Range   WBC 31.2 (H) 4.0 - 10.5 K/uL   RBC 3.56 (L) 4.22 - 5.81 MIL/uL   Hemoglobin 10.5 (L) 13.0 - 17.0 g/dL   HCT 16.132.4 (L) 09.639.0 - 04.552.0 %   MCV 91.0 80.0 - 100.0 fL   MCH 29.5 26.0 - 34.0 pg   MCHC 32.4 30.0 - 36.0 g/dL   RDW 40.918.6 (H) 81.111.5 - 91.415.5 %   Platelets 198 150 - 400 K/uL   nRBC 0.0 0.0 - 0.2 %  Comment: Performed at [facility], [place] [address] [place], [place] [address]  Basic metabolic panel     Status: Abnormal   Collection Time: 02/16/19 11:41 AM  Result Value Ref Range   Sodium 135 135 - 145 mmol/L   Potassium 3.2 (L) 3.5 - 5.1 mmol/L   Chloride 104 98 - 111 mmol/L   CO2 16 (L) 22 - 32 mmol/L   Glucose, Bld 105 (H) 70 - 99 mg/dL   BUN 46 (H) 6 - 20 mg/dL   Creatinine, Ser 2.57 (H) 0.61 - 1.24 mg/dL   Calcium 9.5 8.9 - 10.3 mg/dL   GFR calc non Af Amer 26 (L) >60 mL/min   GFR calc Af Amer 31 (L) >60 mL/min   Anion gap 15 5 - 15    Comment: Performed at [facility] [address], [place], [place] [address]  Glucose, capillary     Status: Abnormal   Collection Time: 02/16/19 12:06 PM  Result Value Ref Range   Glucose-Capillary 113 (H) 70 - 99 mg/dL  Glucose, capillary     Status: Abnormal   Collection Time: 02/16/19  4:09 PM  Result Value Ref Range   Glucose-Capillary 100 (H) 70 - 99 mg/dL  Glucose, capillary     Status: Abnormal   Collection Time: 02/16/19  8:16 PM  Result Value Ref Range   Glucose-Capillary 117 (H) 70 - 99 mg/dL  CBC     Status: Abnormal   Collection Time: 02/17/19  3:04 AM  Result Value Ref Range   WBC 30.9 (H) 4.0 - 10.5 K/uL   RBC 3.34 (L) 4.22 - 5.81 MIL/uL   Hemoglobin 10.0 (L) 13.0 - 17.0 g/dL   HCT 30.2 (L) 39.0 - 52.0 %   MCV 90.4 80.0 - 100.0 fL   MCH 29.9 26.0 - 34.0 pg   MCHC 33.1 30.0 - 36.0 g/dL   RDW 18.5 (H) 11.5 - 15.5 %   Platelets 194 150 - 400 K/uL   nRBC 0.0 0.0 - 0.2 %    Comment: Performed at [facility], [place] [address],  [place], [place] [address]  Basic metabolic panel     Status: Abnormal   Collection Time: 02/17/19  8:00 AM  Result Value Ref Range   Sodium 133 (L) 135 - 145 mmol/L   Potassium 3.9 3.5 - 5.1 mmol/L   Chloride 103 98 - 111 mmol/L   CO2 18 (L) 22 - 32 mmol/L   Glucose, Bld 121 (H) 70 - 99 mg/dL   BUN 46 (H) 6 - 20 mg/dL   Creatinine, Ser 2.71 (H) 0.61 - 1.24 mg/dL   Calcium 9.6 8.9 - 10.3 mg/dL   GFR calc non Af Amer 25 (L) >60 mL/min   GFR calc Af Amer 29 (L) >60 mL/min   Anion gap 12 5 - 15    Comment: Performed at [facility] [address], [place], [place] [address]  Glucose, capillary     Status: Abnormal   Collection Time: 02/17/19 12:58 PM  Result Value Ref Range   Glucose-Capillary 106 (H) 70 - 99 mg/dL   Comment 1 Notify RN    Comment 2 Document in Chart     Medications:  Current Facility-Administered Medications  Medication Dose Route Frequency Provider Last Rate Last Dose  . 0.9 %  sodium chloride infusion   Intra-arterial PRN Manuella Ghazi, Pratik D, DO      . 0.9 %  sodium chloride infusion  250 mL Intravenous Continuous Oretha Milch, MD   Stopped at 02/12/19 1737  . 0.9 %  sodium chloride infusion   Intravenous PRN Hughie Closs, MD 10 mL/hr at 02/15/19 0811 1,000 mL at 02/15/19 0811  . benztropine mesylate (COGENTIN) injection 0.5 mg  0.5 mg Intramuscular Daily Regalado, Belkys A, MD      . fentaNYL (SUBLIMAZE) injection 25-50 mcg  25-50 mcg Intravenous Q1H PRN Oretha Milch, MD   50 mcg at 02/17/19 1139  . haloperidol (HALDOL) tablet 2 mg  2 mg Oral Q6H PRN Icard, Bradley L, DO   2 mg at 02/17/19 0934   Or  . haloperidol lactate (HALDOL) injection 1 mg  1 mg Intramuscular Q6H PRN Icard, Bradley L, DO   1 mg at 02/14/19 1345  . haloperidol lactate (HALDOL) injection 0.5 mg  0.5 mg Intravenous BID BM & HS PRN Migdalia Dk, MD   0.5 mg at 02/16/19 0104  . hydrocortisone sodium succinate (SOLU-CORTEF) 100 MG injection 25 mg  25 mg Intravenous Q12H Regalado, Belkys A,  MD      . insulin aspart (novoLOG) injection 0-9 Units  0-9 Units Subcutaneous Q4H Shah, Pratik D, DO   1 Units at 02/15/19 1735  . lidocaine (PF) (XYLOCAINE) 1 % injection    PRN Brayton El, PA-C   10 mL at 02/14/19 1207  . midodrine (PROAMATINE) tablet 10 mg  10 mg Oral TID WC Shah, Pratik D, DO   10 mg at 02/17/19 0925  . ondansetron (ZOFRAN) injection 4 mg  4 mg Intravenous Q6H PRN Pahwani, Rinka R, MD      . pantoprazole (PROTONIX) injection 40 mg  40 mg Intravenous Q12H Regalado, Belkys A, MD   40 mg at 02/17/19 0138  . piperacillin-tazobactam (ZOSYN) IVPB 3.375 g  3.375 g Intravenous Q8H Vicente Serene, RPH 12.5 mL/hr at 02/17/19 0928 3.375 g at 02/17/19 0928  . sodium chloride flush (NS) 0.9 % injection 10-40 mL  10-40 mL Intracatheter PRN Pahwani, Kasandra Knudsen, MD        Musculoskeletal: Strength & Muscle Tone: within normal limits Gait & Station:  Seen resting in bed  Patient leans: N/A  Psychiatric Specialty Exam: Physical Exam  Psychiatric: He has a normal mood and affect.    Review of Systems  All other systems reviewed and are negative.   Blood pressure 127/73, pulse 75, temperature 97.6 F (36.4 C), temperature source Oral, resp. rate 20, height  (1.981 m), weight 109.6 kg, SpO2 98 %.Body mass index is 27.92 kg/m.  General Appearance: Casual  Eye Contact:  Good  Speech:  Clear and Coherent  Volume:  Normal  Mood:  Anxious  Affect:  Congruent  Thought Process:  Coherent  Orientation:  Other:  To person and place  Thought Content:  Logical  Suicidal Thoughts:  No  Homicidal Thoughts:  No  Memory:  Immediate;   Fair Recent;   Fair  Judgement:  Fair  Insight:  Fair  Psychomotor Activity:  NA  Concentration:  Concentration: Fair  Recall:  [employer] of Knowledge:  Fair  Language:  Fair  Akathisia:  No  Handed:   AIMS (if indicated):     Assets:  Communication Skills Desire for Improvement Social Support  ADL's:   Cognition:  WNL  Sleep:         Treatment Plan Summary: Daily contact with patient to assess and evaluate symptoms and progress in treatment and Medication management  Consider  changing Haldol 2 mg PRN to standing Haldol 2 mg BID.  Disposition: No evidence of imminent risk to self or others at present.    This service was provided via telemedicine using a 2-way, interactive audio and video technology.  Names of all persons participating in this telemedicine service and their role in this encounter. Name: Kylan Liberati Role: patient   Name: T.Lewis Role: N.P          Oneta Rack, NP 02/17/2019 1:18 PM

## 2019-02-17 NOTE — Progress Notes (Signed)
Patient has been agitated intermittently  Asking for Cogentin constantly. Intermittently complains of pain in the abdomen and wants to eat. He is very repetitive with what he asked for and keeps on talking even when staff has already stepped out of the room. Constantly on the call light asking for the same stuff. Other staff stated that he seem to be better compared to previous days. He is oriented to self, time and place. No family or friends to visit so far.Fentanyl PRN given for pain and Haldol given for agitation.

## 2019-02-17 NOTE — Progress Notes (Addendum)
PROGRESS NOTE    [name]  QMG:[id number] DOB: 03/06/1961 DOA: 02/08/2019 PCP: Fleet Contras, MD   Brief Narrative: 58 year old with past medical history significant for cirrhosis with splenomegaly, esophageal varices, hepatitis C, adrenal insufficiency, hypothyroidism, schizoaffective disorder, PTSD, GI bleed, neuropathy, chronic kidney disease presented to the emergency department due to worsening abdominal pain, distention associated with rectal bleeding for 1 day.  Work-up revealed bowel perforation pneumoperitoneum.  Surgery consulted and recommended no operative intervention due to high risk of mortality, antibiotics and bowel rest.  GI was consulted for possible paracentesis due to significant ascites as well as dark maroon stool and acute blood loss anemia.  Protonix infusion was initiated as well as octreotide tried as patient has history of esophageal varices.  Patient developed low blood pressure.  CCM was consulted.  Patient was transferred back to triad service on 02/12/2019.  Patient underwent UGI on 11/16 and he was found to have focal contained perforation in the distal stomach.  General surgery and GI recommended keep NPO.  No surgical intervention due to poor surgical candidate.   Assessment & Plan:   Principal Problem:   Bowel perforation (HCC) Active Problems:   Schizoaffective disorder (HCC)   PTSD (post-traumatic stress disorder)   History of adrenal insufficiency   GI bleed   Cirrhosis (HCC)   Esophageal varices in cirrhosis (HCC)   CKD (chronic kidney disease) stage 3, GFR 30-59 ml/min   Abdominal pain   Bleeding per rectum   Septic shock (HCC)   Central venous catheter in place   Goals of care, counseling/discussion   Palliative care by specialist   AKI (acute kidney injury) (HCC)  1-Septic Shock due to bowel perforation/pneumoperitoneum/gastric perforation: Evaluated by [facility] and deemed not to be a surgical candidate given high mortality. Patient  endoscopy with no esophageal varices. Patient was on vasopressors at one point during his stay in the ICU. He has received a couple of dose of albumin. He is currently on midodrine.  On IV Zosyn. GI series showed gastric perforation with contained pneumoperitoneum. Patient has been off TPN, surgery to decide in resumption.  Patient was a started on clear diet.  Palliative consulted again for goals of care, recommended psych consult for capacity evaluation.  He is still complaining of abdominal pain. He had 5 L remove two days ago. He is sleepy just received fentanyl.  Completed protonix gtt. Start IV Protonix BID.  WBC still at 30, surgery recommend getting CT abdomen. follow  2-Hystory of adrenal insufficiency: He is on 50 mg of prednisone daily out patient.  Resume hydrocortisone low dose.   3-Cirrhosis with ascites: Meld score 23 , Child Pugh C; History of esophageal varices, HCV (treated with Epclusa in 2018, followed by liver clinic at [facility]). GI following, appreciate assistance Underwent paracentesis on 11/18 with WBC more than  2000. Body fluid no growth to date.  Continue with IV antibiotics.   Anemia with GI bleed: Due to known gastric and duodenal ulcer had EGD Oct 2020 which showed grade D esophagitis, 2cm hiatal hernia, 5cm non-bleeding gastric ulcer and non-bleeding duodenal ulcers). Continue with PPI  Acute renal failure in the setting of chronic kidney disease a stage III: Creatinine baseline 1.7--2.0 Renal ultrasound showed chronic kidney disease Nephrology has signed off Monitor creatinine. Increase cr today, follows trend  Hyponatremia: resolved with D5 . Stop IV fluids.   Hypokalemia; replete IV again today   History of PTSD and schizoaffective disorder chronic pain On fentanyl Patient expressed some suicidal  ideation, psychiatric was consulted.  He was cleared by psych. On as needed Haldol  I added  cogentin, he is getting stiff. Await psych rec  Palliative recommend Psych evaluation for capacity.   Metabolic acidosis;  Improved with bicarb gtt.  Monitor.   Nutrition Problem: Increased nutrient needs Etiology: chronic illness(cirrhosis)    Signs/Symptoms: estimated needs    Interventions: TPN  Estimated body mass index is 27.92 kg/m as calculated from the following:   Height as of this encounter: 6\' 6"  (1.981 m).   Weight as of this encounter: 109.6 kg.   DVT prophylaxis: SCD Code Status: Full code Family Communication: he relates he doesn't have family here, he has cousin in [place], he has many friends.  Disposition Plan: Patient with multiple organ failure and now with gastric perforation with worsening leukocytosis, patient is very poor prognosis.  Palliative care following in consultation.  Awaiting psych evaluation for capacity. will need safe discharge plan, he was living by himself. CM consulted.  Consultants:  General surgery GI  Procedures:   US paracentesis 02/14/2019  Antimicrobials:  Zosyn   Subjective: He is alert, asking for cogentin again. Dose yesterday help with rigidity.  He is complaining of worsening abdominal pain   Objective: Vitals:   02/15/19 2010 02/16/19 0449 02/16/19 2019 02/17/19 0531  BP: 114/74 137/80 140/74 127/73  Pulse: 66 82 71 75  Resp: 17 18 16 20   Temp: 98.4 F (36.9 C) 98.1 F (36.7 C) 98 F (36.7 C) 97.6 F (36.4 C)  TempSrc: Oral Oral Oral Oral  SpO2: 96% 97% 94% 98%  Weight:      Height:        Intake/Output Summary (Last 24 hours) at 02/17/2019 1157 Last data filed at 02/17/2019 0826 Gross per 24 hour  Intake 177.76 ml  Output 250 ml  Net -72.24 ml   Filed Weights   02/11/19 0500 02/11/19 0501 02/12/19 0136  Weight: 109.9 kg 109.9 kg 109.6 kg    Examination:  General exam: Chronic ill appearing. icteric Respiratory system: Crackles bases Cardiovascular system: S 1, S 2 RRR Gastrointestinal system: BS present, distend, very tender.   Central nervous system: alert Extremities:  No edema Skin: No rashes.     Data Reviewed: I have personally reviewed following labs and imaging studies  CBC: Recent Labs  Lab 02/13/19 0552 02/14/19 0619 02/15/19 0319 02/16/19 1141 02/17/19 0304  WBC 3.5* 5.0 10.0 31.2* 30.9*  NEUTROABS 2.9 4.3  --   --   --   HGB 8.0* 8.3* 8.7* 10.5* 10.0*  HCT 24.5* 25.7* 26.7* 32.4* 30.2*  MCV 92.8 92.1 91.4 91.0 90.4  PLT 74* 76* 103* 198 194   Basic Metabolic Panel: Recent Labs  Lab 02/13/19 0552 02/14/19 0619 02/15/19 0319 02/16/19 1141 02/17/19 0800  NA 146* 149* 136 135 133*  K 3.4* 3.3* 3.0* 3.2* 3.9  CL 112* 114* 106 104 103  CO2 19* 19* 17* 16* 18*  GLUCOSE 97 78 94 105* 121*  BUN 55* 55* 47* 46* 46*  CREATININE 2.59* 2.59* 2.33* 2.57* 2.71*  CALCIUM 9.3 9.8 9.4 9.5 9.6  MG  --  2.2  --   --   --   PHOS  --  2.6  --   --   --    GFR: Estimated Creatinine Clearance: 38.4 mL/min (A) (by C-G formula based on SCr of 2.71 mg/dL (H)). Liver Function Tests: Recent Labs  Lab 02/11/19 0345 02/12/19 0444 02/14/19 0619  AST 15 11* 16  ALT 9 9 9   ALKPHOS 31* 32* 36*  BILITOT 1.6* 2.0* 2.2*  PROT 5.5* 5.8* 5.2*  ALBUMIN 3.8 3.7 2.9*   No results for input(s): LIPASE, AMYLASE in the last 168 hours. No results for input(s): AMMONIA in the last 168 hours. Coagulation Profile: Recent Labs  Lab 02/15/19 0319  INR 1.4*   Cardiac Enzymes: No results for input(s): CKTOTAL, CKMB, CKMBINDEX, TROPONINI in the last 168 hours. BNP (last 3 results) No results for input(s): PROBNP in the last 8760 hours. HbA1C: No results for input(s): HGBA1C in the last 72 hours. CBG: Recent Labs  Lab 02/15/19 2007 02/16/19 0800 02/16/19 1206 02/16/19 1609 02/16/19 2016  GLUCAP 90 93 113* 100* 117*   Lipid Profile: No results for input(s): CHOL, HDL, LDLCALC, TRIG, CHOLHDL, LDLDIRECT in the last 72 hours. Thyroid Function Tests: No results for input(s): TSH, T4TOTAL, FREET4, T3FREE,  THYROIDAB in the last 72 hours. Anemia Panel: No results for input(s): VITAMINB12, FOLATE, FERRITIN, TIBC, IRON, RETICCTPCT in the last 72 hours. Sepsis Labs: No results for input(s): PROCALCITON, LATICACIDVEN in the last 168 hours.  Recent Results (from the past 240 hour(s))  SARS CORONAVIRUS 2 (TAT 6-24 HRS) Nasopharyngeal Nasopharyngeal Swab     Status: None   Collection Time: 02/08/19  3:20 PM   Specimen: Nasopharyngeal Swab  Result Value Ref Range Status   SARS Coronavirus 2 NEGATIVE NEGATIVE Final    Comment: (NOTE) SARS-CoV-2 target nucleic acids are NOT DETECTED. The SARS-CoV-2 RNA is generally detectable in upper and lower respiratory specimens during the acute phase of infection. Negative results do not preclude SARS-CoV-2 infection, do not rule out co-infections with other pathogens, and should not be used as the sole basis for treatment or other patient management decisions. Negative results must be combined with clinical observations, patient history, and epidemiological information. The expected result is Negative. Fact Sheet for Patients: SugarRoll.be Fact Sheet for Healthcare Providers: https://www.woods-mathews.com/ This test is not yet approved or cleared by the [place] FDA and  has been authorized for detection and/or diagnosis of SARS-CoV-2 by FDA under an Emergency Use Authorization (EUA). This EUA will remain  in effect (meaning this test can be used) for the duration of the COVID-19 declaration under Section 56 4(b)(1) of the Act, 21 U.S.C. section 360bbb-3(b)(1), unless the authorization is terminated or revoked sooner. Performed at [facility], [place] [address] [place], [place] [address]   Blood culture (routine x 2)     Status: None   Collection Time: 02/08/19  5:08 PM   Specimen: BLOOD RIGHT WRIST  Result Value Ref Range Status   Specimen Description BLOOD RIGHT WRIST  Final   Special Requests    Final    AEROBIC BOTTLE ONLY Blood Culture results may not be optimal due to an inadequate volume of blood received in culture bottles   Culture   Final    NO GROWTH 5 DAYS Performed at [facility], [place] [address] [place], [place] [address]    Report Status 02/13/2019 FINAL  Final  Blood culture (routine x 2)     Status: None   Collection Time: 02/08/19  5:10 PM   Specimen: BLOOD RIGHT HAND  Result Value Ref Range Status   Specimen Description BLOOD RIGHT HAND  Final   Special Requests   Final    BOTTLES DRAWN AEROBIC AND ANAEROBIC Blood Culture results may not be optimal due to an inadequate volume of blood received in culture bottles   Culture  Final    NO GROWTH 5 DAYS Performed at [facility], [address], [place], [place] [address]    Report Status 02/13/2019 FINAL  Final  MRSA PCR Screening     Status: None   Collection Time: 02/09/19  3:30 PM   Specimen: Nasopharyngeal  Result Value Ref Range Status   MRSA by PCR NEGATIVE NEGATIVE Final    Comment:        The GeneXpert MRSA Assay (FDA approved for NASAL specimens only), is one component of a comprehensive MRSA colonization surveillance program. It is not intended to diagnose MRSA infection nor to guide or monitor treatment for MRSA infections. Performed at [facility], [address], [place], [place] [address]   Body fluid culture (includes gram stain)     Status: None   Collection Time: 02/09/19  5:30 PM   Specimen: Pleural Fluid  Result Value Ref Range Status   Specimen Description FLUID  Final   Special Requests NONE  Final   Gram Stain   Final    FEW WBC PRESENT, PREDOMINANTLY PMN NO ORGANISMS SEEN    Culture   Final    NO GROWTH 3 DAYS Performed at [facility], [address], [place], [place] [address]    Report Status 02/13/2019 FINAL  Final  Fungus Culture With Stain     Status: None (Preliminary result)   Collection Time: 02/09/19  5:30 PM   Specimen: Peritoneal  Cavity; Pleural Fluid  Result Value Ref Range Status   Fungus Stain Final report  Final    Comment: (NOTE) Performed At: [facility] [address] [place], [place] [id number] Jolene Schimke MD HG:[id number]    Fungus (Mycology) Culture PENDING  Incomplete   Fungal Source FLUID  Final    Comment: Performed at [facility], [address], [place], [place] [address]  Fungus Culture Result     Status: None   Collection Time: 02/09/19  5:30 PM  Result Value Ref Range Status   Result 1 Comment  Final    Comment: (NOTE) KOH/Calcofluor preparation:  no fungus observed. Performed At: [facility] [address] [place], [place] [id number] Jolene Schimke MD ER:[id number]          Radiology Studies: No results found.      Scheduled Meds: . benztropine mesylate  0.5 mg Intramuscular Daily  . hydrocortisone sod succinate (SOLU-CORTEF) inj  50 mg Intravenous Q12H  . insulin aspart  0-9 Units Subcutaneous Q4H  . midodrine  10 mg Oral TID WC  . pantoprazole (PROTONIX) IV  40 mg Intravenous Q12H   Continuous Infusions: . sodium chloride    . sodium chloride Stopped (02/12/19 1737)  . sodium chloride 1,000 mL (02/15/19 0811)  . piperacillin-tazobactam (ZOSYN)  IV 3.375 g (02/17/19 0928)     LOS: 9 days    Time spent: 35 minutes.     Alba Cory, MD [facility]   If 7PM-7AM, please contact night-coverage [email or web address] Password [facility] 02/17/2019, 11:57 AM

## 2019-02-17 NOTE — Progress Notes (Signed)
CT examined. Reviewed Radiologists findings. Large volume pneumoperitoneum with gas between liver and duodenum. Given cirrhosis, I do not think he would survive a surgery to patch this.He continues to just want to eat food. Abdomen quite distended but no guarding, states exam does not hurt. - We will continue antibiotics and bowel rest.  - I do not anticipate this ulcer to heal

## 2019-02-17 NOTE — Progress Notes (Signed)
Refusing CBGs , MD aware.

## 2019-02-17 NOTE — Progress Notes (Signed)
Subjective: CC: Patient complains of increasing lower abdominal pressure and pain. Currently on a CLD and not drinking much. No N/v. He has as rectal tube in place with dark, blackish stool in bag.   Objective: Vital signs in last 24 hours: Temp:  [97.6 F (36.4 C)-98 F (36.7 C)] 97.6 F (36.4 C) (11/21 0531) Pulse Rate:  [71-75] 75 (11/21 0531) Resp:  [16-20] 20 (11/21 0531) BP: (127-140)/(73-74) 127/73 (11/21 0531) SpO2:  [94 %-98 %] 98 % (11/21 0531) Last BM Date: 02/16/19  Intake/Output from previous day: 11/20 0701 - 11/21 0700 In: 877.8 [I.V.:716.5; IV Piggyback:161.3] Out: 250 [Urine:250] Intake/Output this shift: No intake/output data recorded.  PE: Gen:  Alert, NAD, pleasant Lungs: Normal rate and effort  Abd: Soft, distended, fluid wave, ascites, mild tenderness of the lower abdomen, hypoactive bowel sounds, rectal tube in place with black stool in bag.  Ext: 1+ LE edema  Lab Results:  Recent Labs    02/16/19 1141 02/17/19 0304  WBC 31.2* 30.9*  HGB 10.5* 10.0*  HCT 32.4* 30.2*  PLT 198 194   BMET Recent Labs    02/15/19 0319 02/16/19 1141  NA 136 135  K 3.0* 3.2*  CL 106 104  CO2 17* 16*  GLUCOSE 94 105*  BUN 47* 46*  CREATININE 2.33* 2.57*  CALCIUM 9.4 9.5   PT/INR Recent Labs    02/15/19 0319  LABPROT 17.0*  INR 1.4*   CMP     Component Value Date/Time   NA 135 02/16/2019 1141   K 3.2 (L) 02/16/2019 1141   CL 104 02/16/2019 1141   CO2 16 (L) 02/16/2019 1141   GLUCOSE 105 (H) 02/16/2019 1141   BUN 46 (H) 02/16/2019 1141   CREATININE 2.57 (H) 02/16/2019 1141   CALCIUM 9.5 02/16/2019 1141   CALCIUM 8.5 09/30/2010 0437   PROT 5.2 (L) 02/14/2019 0619   PROT 5.4 (L) 11/02/2017 1147   ALBUMIN 2.9 (L) 02/14/2019 0619   AST 16 02/14/2019 0619   ALT 9 02/14/2019 0619   ALKPHOS 36 (L) 02/14/2019 0619   BILITOT 2.2 (H) 02/14/2019 0619   GFRNONAA 26 (L) 02/16/2019 1141   GFRAA 31 (L) 02/16/2019 1141   Lipase     Component  Value Date/Time   LIPASE 26 12/14/2018 0125       Studies/Results: No results found.  Anti-infectives: Anti-infectives (From admission, onward)   Start     Dose/Rate Route Frequency Ordered Stop   02/08/19 2200  piperacillin-tazobactam (ZOSYN) IVPB 3.375 g     3.375 g 12.5 mL/hr over 240 Minutes Intravenous Every 8 hours 02/08/19 1552     02/08/19 1545  piperacillin-tazobactam (ZOSYN) IVPB 3.375 g     3.375 g 100 mL/hr over 30 Minutes Intravenous  Once 02/08/19 1533 02/08/19 1840       Assessment/Plan Schizoaffective disorder Chronic pain [name] Hepatitis C CKDw/ AKI - Cr 2.57 ABL anemia - Hgb10  Contained perforation of distal stomach  GI bleed - Last CT 11/12 with a small amount of free air on CT scan - S/Pparacentesis11/13, cultures, NGTD, fungal culture also pending with NGTD -UGIshowed focal contained perforation in the distal stomach - PPI, TPN - WBC increased yesterday and still elevated today. Repeat CT scan to assess for abscess. Will be with oral contrast only 2/2 kidney function - Continue IV abx  - Place back on bowel rest until ct scan is back  - He has a high mortality with any surgical intervention  given his severe liver disease.  ID -Zosyn VTE -SCDs FEN -IVF,NPO, discuss with MD about restarting TPN  Foley -none Follow up -TBD     LOS: 9 days    [name] , [facility] 02/17/2019, 8:15 AM Please see Amion for pager number during day hours 7:00am-4:30pm

## 2019-02-18 ENCOUNTER — Inpatient Hospital Stay: Payer: Self-pay

## 2019-02-18 LAB — CBC
HCT: 31.5 % — ABNORMAL LOW (ref 39.0–52.0)
Hemoglobin: 10.2 g/dL — ABNORMAL LOW (ref 13.0–17.0)
MCH: 29.5 pg (ref 26.0–34.0)
MCHC: 32.4 g/dL (ref 30.0–36.0)
MCV: 91 fL (ref 80.0–100.0)
Platelets: 243 10*3/uL (ref 150–400)
RBC: 3.46 MIL/uL — ABNORMAL LOW (ref 4.22–5.81)
RDW: 18.8 % — ABNORMAL HIGH (ref 11.5–15.5)
WBC: 31.7 10*3/uL — ABNORMAL HIGH (ref 4.0–10.5)
nRBC: 0 % (ref 0.0–0.2)

## 2019-02-18 LAB — COMPREHENSIVE METABOLIC PANEL
ALT: 13 U/L (ref 0–44)
AST: 17 U/L (ref 15–41)
Albumin: 2.5 g/dL — ABNORMAL LOW (ref 3.5–5.0)
Alkaline Phosphatase: 63 U/L (ref 38–126)
Anion gap: 12 (ref 5–15)
BUN: 48 mg/dL — ABNORMAL HIGH (ref 6–20)
CO2: 19 mmol/L — ABNORMAL LOW (ref 22–32)
Calcium: 9.7 mg/dL (ref 8.9–10.3)
Chloride: 103 mmol/L (ref 98–111)
Creatinine, Ser: 2.89 mg/dL — ABNORMAL HIGH (ref 0.61–1.24)
GFR calc Af Amer: 27 mL/min — ABNORMAL LOW (ref >60)
GFR calc non Af Amer: 23 mL/min — ABNORMAL LOW (ref >60)
Glucose, Bld: 94 mg/dL (ref 70–99)
Potassium: 3.5 mmol/L (ref 3.5–5.1)
Sodium: 134 mmol/L — ABNORMAL LOW (ref 135–145)
Total Bilirubin: 2.3 mg/dL — ABNORMAL HIGH (ref 0.3–1.2)
Total Protein: 6 g/dL — ABNORMAL LOW (ref 6.5–8.1)

## 2019-02-18 LAB — URINALYSIS, ROUTINE W REFLEX MICROSCOPIC
Bilirubin Urine: NEGATIVE
Glucose, UA: NEGATIVE mg/dL
Hgb urine dipstick: NEGATIVE
Ketones, ur: NEGATIVE mg/dL
Leukocytes,Ua: NEGATIVE
Nitrite: NEGATIVE
Protein, ur: NEGATIVE mg/dL
Specific Gravity, Urine: 1.019 (ref 1.005–1.030)
pH: 5 (ref 5.0–8.0)

## 2019-02-18 LAB — GLUCOSE, CAPILLARY
Glucose-Capillary: 97 mg/dL (ref 70–99)
Glucose-Capillary: 84 mg/dL (ref 70–99)
Glucose-Capillary: 86 mg/dL (ref 70–99)
Glucose-Capillary: 85 mg/dL (ref 70–99)
Glucose-Capillary: 82 mg/dL (ref 70–99)
Glucose-Capillary: 105 mg/dL — ABNORMAL HIGH (ref 70–99)

## 2019-02-18 MED ORDER — LORAZEPAM 2 MG/ML IJ SOLN
0.5000 mg | Freq: Once | INTRAMUSCULAR | Status: AC
  Administered 2019-02-18: 19:00:00 0.5 mg via INTRAVENOUS
  Filled 2019-02-18: qty 1

## 2019-02-18 MED ORDER — FENTANYL CITRATE (PF) 100 MCG/2ML IJ SOLN
50.0000 ug | INTRAMUSCULAR | Status: DC | PRN
  Administered 2019-02-18 – 2019-02-19 (×11): 75 ug via INTRAVENOUS
  Administered 2019-02-19: 50 ug via INTRAVENOUS
  Administered 2019-02-19 – 2019-03-03 (×88): 75 ug via INTRAVENOUS
  Filled 2019-02-18 (×104): qty 2

## 2019-02-18 MED ORDER — TRAVASOL 10 % IV SOLN
INTRAVENOUS | Status: AC
  Administered 2019-02-18: 18:00:00 via INTRAVENOUS
  Filled 2019-02-18: qty 576

## 2019-02-18 MED ORDER — SODIUM CHLORIDE 0.9% FLUSH
10.0000 mL | INTRAVENOUS | Status: DC | PRN
  Administered 2019-03-14: 10 mL

## 2019-02-18 MED ORDER — CHLORHEXIDINE GLUCONATE CLOTH 2 % EX PADS
6.0000 | MEDICATED_PAD | Freq: Every day | CUTANEOUS | Status: DC
  Administered 2019-02-18 – 2019-03-27 (×35): 6 via TOPICAL

## 2019-02-18 MED ORDER — SODIUM CHLORIDE 0.9% FLUSH
10.0000 mL | Freq: Two times a day (BID) | INTRAVENOUS | Status: DC
  Administered 2019-02-20 – 2019-03-27 (×21): 10 mL

## 2019-02-18 NOTE — Progress Notes (Signed)
D/w Dr. Tyrell Antonio.  Agree with increase in fentanyl to 72mcg.  Attempted to reach out to friend listed as emergency contact, [name] Cendic.   No answer.  Chart review reveals that his mother had been his surrogate decision maker in the past, but she is reported to have died in 04/29/2022.  Psychiatry consulted for opinion on his capacity to make his own decisions.  While I did not personally evaluate him today, I question his insight into his condition based upon chart review of his interactions with providers.   Recommend we continue to work to identify surrogate to assist with decision making on his behalf.  He indicated to Dr. Tyrell Antonio today this would be his friend, Daleen Snook, whom I attempted to contact unsuccessfully.    Micheline Rough, MD [facility] [phone]  NO CHARGE NOTE

## 2019-02-18 NOTE — Progress Notes (Signed)
PROGRESS NOTE    [name]  ZOX:[id number] DOB: 12/20/1960 DOA: 02/08/2019 PCP: Fleet ContrasAvbuere, Edwin, MD   Brief Narrative: 58 year old with past medical history significant for cirrhosis with splenomegaly, esophageal varices, hepatitis C, adrenal insufficiency, hypothyroidism, schizoaffective disorder, PTSD, GI bleed, neuropathy, chronic kidney disease presented to the emergency department due to worsening abdominal pain, distention associated with rectal bleeding for 1 day.  Work-up revealed bowel perforation pneumoperitoneum.  Surgery consulted and recommended no operative intervention due to high risk of mortality, antibiotics and bowel rest.  GI was consulted for possible paracentesis due to significant ascites as well as dark maroon stool and acute blood loss anemia.  Protonix infusion was initiated as well as octreotide tried as patient has history of esophageal varices.  Patient developed low blood pressure.  CCM was consulted.  Patient was transferred back to triad service on 02/12/2019.  Patient underwent UGI on 11/16 and he was found to have focal contained perforation in the distal stomach.  General surgery and GI recommended keep NPO.  No surgical intervention due to poor surgical candidate.   Assessment & Plan:   Principal Problem:   Bowel perforation (HCC) Active Problems:   Schizoaffective disorder (HCC)   PTSD (post-traumatic stress disorder)   History of adrenal insufficiency   GI bleed   Cirrhosis (HCC)   Esophageal varices in cirrhosis (HCC)   CKD (chronic kidney disease) stage 3, GFR 30-59 ml/min   Abdominal pain   Bleeding per rectum   Septic shock (HCC)   Central venous catheter in place   Goals of care, counseling/discussion   Palliative care by specialist   AKI (acute kidney injury) (HCC)  1-Septic Shock due to bowel perforation/pneumoperitoneum/gastric perforation: Evaluated by [facility] and deemed not to be a surgical candidate given high mortality. Patient  endoscopy with no esophageal varices. Patient was on vasopressors at one point during his stay in the ICU. He has received a couple of dose of albumin. He is currently on midodrine.  On IV Zosyn. GI series showed gastric perforation with contained pneumoperitoneum. Patient has been off TPN, surgery to decide in resumption.  Patient was a started on clear diet.  Palliative consulted again for goals of care, recommended psych consult for capacity evaluation.  He is still complaining of abdominal pain. He had 5 L remove two days ago. He is sleepy just received fentanyl.  Completed protonix gtt. On  IV Protonix BID.  WBC still at 30, CT abdomen showed large volume of free intraluminal fluid and pneumoperitoneum. Bowel perforation near pylorus or duodenum.  Surgery is recommending Bowel rest, TPN.  Place order for TPN.   2-Hystory of adrenal insufficiency: He is on 50 mg of prednisone daily out patient.  On hydrocortisone low dose.   3-Cirrhosis with ascites: Meld score 23 , Child Pugh C; History of esophageal varices, HCV (treated with Epclusa in 2018, followed [facility] at [facility]). GI following, appreciate assistance Underwent paracentesis on 11/18 with WBC more than  2000. Body fluid no growth to date.    Anemia with GI bleed: Due to known gastric and duodenal ulcer had EGD Oct 2020 which showed grade D esophagitis, 2cm hiatal hernia, 5cm non-bleeding gastric ulcer and non-bleeding duodenal ulcers). Continue with PPI  Acute renal failure in the setting of chronic kidney disease a stage III: Creatinine baseline 1.7--2.0 Renal ultrasound showed chronic kidney disease Nephrology has signed off Worsening renal function. Nephrology consulted.   Hyponatremia: resolved with D5 . Stop IV fluids.   Hypokalemia;  replete IV again today   History of PTSD and schizoaffective disorder chronic pain On fentanyl Patient expressed some suicidal ideation, psychiatric was consulted.  He  was cleared by psych. On as needed Haldol  I added  cogentin, he is getting stiff. Await psych rec Palliative recommend Psych evaluation for capacity.   Metabolic acidosis;  Improved with bicarb gtt.  Monitor.   Nutrition Problem: Increased nutrient needs Etiology: chronic illness(cirrhosis)    Signs/Symptoms: estimated needs    Interventions: TPN  Estimated body mass index is 27.92 kg/m as calculated from the following:   Height as of this encounter: 6\' 6"  (1.981 m).   Weight as of this encounter: 109.6 kg.   DVT prophylaxis: SCD Code Status: Full code Family Communication: he relates he doesn't have family here, he has cousin in [place], he has many friends. Left message to friend and CM Disposition Plan: Patient with multiple organ failure and now with gastric perforation with worsening leukocytosis, patient is very poor prognosis.  Palliative care following in consultation.  Awaiting psych evaluation for capacity. will need safe discharge plan, he was living by himself. CM consulted.  Consultants:  General surgery GI  Procedures:   US paracentesis 02/14/2019  Antimicrobials:  Zosyn   Subjective: He is complaining of abdominal pain. He relates Emi Holes is his friend, from [place]. She could help with medical decision.   Objective: Vitals:   02/17/19 0531 02/17/19 1514 02/17/19 2022 02/18/19 0355  BP: 127/73 126/74 118/75 120/70  Pulse: 75 66 68 67  Resp: 20 16 18 18   Temp: 97.6 F (36.4 C) 97.9 F (36.6 C) 97.7 F (36.5 C) (!) 97.4 F (36.3 C)  TempSrc: Oral Axillary Oral Oral  SpO2: 98% 96% 95% 97%  Weight:      Height:        Intake/Output Summary (Last 24 hours) at 02/18/2019 1104 Last data filed at 02/18/2019 1059 Gross per 24 hour  Intake -  Output 125 ml  Net -125 ml   Filed Weights   02/11/19 0500 02/11/19 0501 02/12/19 0136  Weight: 109.9 kg 109.9 kg 109.6 kg    Examination:  General exam: NAD, chronic ill appearing.  Respiratory  system: Crackles bases.  Cardiovascular system: S 1, S 2 RRR Gastrointestinal system: BS present, distended, tender.  Central nervous system: Alert Extremities:  No edema Skin: No rashes.     Data Reviewed: I have personally reviewed following labs and imaging studies  CBC: Recent Labs  Lab 02/13/19 0552 02/14/19 0619 02/15/19 0319 02/16/19 1141 02/17/19 0304 02/18/19 0324  WBC 3.5* 5.0 10.0 31.2* 30.9* 31.7*  NEUTROABS 2.9 4.3  --   --   --   --   HGB 8.0* 8.3* 8.7* 10.5* 10.0* 10.2*  HCT 24.5* 25.7* 26.7* 32.4* 30.2* 31.5*  MCV 92.8 92.1 91.4 91.0 90.4 91.0  PLT 74* 76* 103* 198 194 243   Basic Metabolic Panel: Recent Labs  Lab 02/14/19 0619 02/15/19 0319 02/16/19 1141 02/17/19 0800 02/18/19 0324  NA 149* 136 135 133* 134*  K 3.3* 3.0* 3.2* 3.9 3.5  CL 114* 106 104 103 103  CO2 19* 17* 16* 18* 19*  GLUCOSE 78 94 105* 121* 94  BUN 55* 47* 46* 46* 48*  CREATININE 2.59* 2.33* 2.57* 2.71* 2.89*  CALCIUM 9.8 9.4 9.5 9.6 9.7  MG 2.2  --   --   --   --   PHOS 2.6  --   --   --   --  GFR: Estimated Creatinine Clearance: 36 mL/min (A) (by C-G formula based on SCr of 2.89 mg/dL (H)). Liver Function Tests: Recent Labs  Lab 02/12/19 0444 02/14/19 0619 02/18/19 0324  AST 11* 16 17  ALT 9 9 13   ALKPHOS 32* 36* 63  BILITOT 2.0* 2.2* 2.3*  PROT 5.8* 5.2* 6.0*  ALBUMIN 3.7 2.9* 2.5*   No results for input(s): LIPASE, AMYLASE in the last 168 hours. No results for input(s): AMMONIA in the last 168 hours. Coagulation Profile: Recent Labs  Lab 02/15/19 0319  INR 1.4*   Cardiac Enzymes: No results for input(s): CKTOTAL, CKMB, CKMBINDEX, TROPONINI in the last 168 hours. BNP (last 3 results) No results for input(s): PROBNP in the last 8760 hours. HbA1C: No results for input(s): HGBA1C in the last 72 hours. CBG: Recent Labs  Lab 02/17/19 1258 02/17/19 2017 02/18/19 0104 02/18/19 0325 02/18/19 0748  GLUCAP 106* 106* 97 84 86   Lipid Profile: No results  for input(s): CHOL, HDL, LDLCALC, TRIG, CHOLHDL, LDLDIRECT in the last 72 hours. Thyroid Function Tests: No results for input(s): TSH, T4TOTAL, FREET4, T3FREE, THYROIDAB in the last 72 hours. Anemia Panel: No results for input(s): VITAMINB12, FOLATE, FERRITIN, TIBC, IRON, RETICCTPCT in the last 72 hours. Sepsis Labs: No results for input(s): PROCALCITON, LATICACIDVEN in the last 168 hours.  Recent Results (from the past 240 hour(s))  SARS CORONAVIRUS 2 (TAT 6-24 HRS) Nasopharyngeal Nasopharyngeal Swab     Status: None   Collection Time: 02/08/19  3:20 PM   Specimen: Nasopharyngeal Swab  Result Value Ref Range Status   SARS Coronavirus 2 NEGATIVE NEGATIVE Final    Comment: (NOTE) SARS-CoV-2 target nucleic acids are NOT DETECTED. The SARS-CoV-2 RNA is generally detectable in upper and lower respiratory specimens during the acute phase of infection. Negative results do not preclude SARS-CoV-2 infection, do not rule out co-infections with other pathogens, and should not be used as the sole basis for treatment or other patient management decisions. Negative results must be combined with clinical observations, patient history, and epidemiological information. The expected result is Negative. Fact Sheet for Patients: 13/12/20 Fact Sheet for Healthcare Providers: HairSlick.no This test is not yet approved or cleared by the quierodirigir.com FDA and  has been authorized for detection and/or diagnosis of SARS-CoV-2 by FDA under an Emergency Use Authorization (EUA). This EUA will remain  in effect (meaning this test can be used) for the duration of the COVID-19 declaration under Section 56 4(b)(1) of the Act, 21 U.S.C. section 360bbb-3(b)(1), unless the authorization is terminated or revoked sooner. Performed at [facility], [address] [place], [place]   Blood culture (routine x 2)     Status: None    Collection Time: 02/08/19  5:08 PM   Specimen: BLOOD RIGHT WRIST  Result Value Ref Range Status   Specimen Description BLOOD RIGHT WRIST  Final   Special Requests   Final    AEROBIC BOTTLE ONLY Blood Culture results may not be optimal due to an inadequate volume of blood received in culture bottles   Culture   Final    NO GROWTH 5 DAYS Performed at [facility], [address], [place], [place]    Report Status 02/13/2019 FINAL  Final  Blood culture (routine x 2)     Status: None   Collection Time: 02/08/19  5:10 PM   Specimen: BLOOD RIGHT HAND  Result Value Ref Range Status   Specimen Description BLOOD RIGHT HAND  Final  Special Requests   Final    BOTTLES DRAWN AEROBIC AND ANAEROBIC Blood Culture results may not be optimal due to an inadequate volume of blood received in culture bottles   Culture   Final    NO GROWTH 5 DAYS Performed at [facility], [address] [place], [place] [address]    Report Status 02/13/2019 FINAL  Final  MRSA PCR Screening     Status: None   Collection Time: 02/09/19  3:30 PM   Specimen: Nasopharyngeal  Result Value Ref Range Status   MRSA by PCR NEGATIVE NEGATIVE Final    Comment:        The GeneXpert MRSA Assay (FDA approved for NASAL specimens only), is one component of a comprehensive MRSA colonization surveillance program. It is not intended to diagnose MRSA infection nor to guide or monitor treatment for MRSA infections. Performed at [facility], [address] [place], [place] [address]   Body fluid culture (includes gram stain)     Status: None   Collection Time: 02/09/19  5:30 PM   Specimen: Pleural Fluid  Result Value Ref Range Status   Specimen Description FLUID  Final   Special Requests NONE  Final   Gram Stain   Final    FEW WBC PRESENT, PREDOMINANTLY PMN NO ORGANISMS SEEN    Culture   Final    NO GROWTH 3 DAYS Performed at [facility], [address] [place], [place] [address]     Report Status 02/13/2019 FINAL  Final  Fungus Culture With Stain     Status: None (Preliminary result)   Collection Time: 02/09/19  5:30 PM   Specimen: Peritoneal Cavity; Pleural Fluid  Result Value Ref Range Status   Fungus Stain Final report  Final    Comment: (NOTE) Performed At: [facility] [address] [place], [place] [id number] Jolene SchimkeNagendra Sanjai MD EX:[id number]:[phone]    Fungus (Mycology) Culture PENDING  Incomplete   Fungal Source FLUID  Final    Comment: Performed at [facility], [address] [place], [place] [address]  Fungus Culture Result     Status: None   Collection Time: 02/09/19  5:30 PM  Result Value Ref Range Status   Result 1 Comment  Final    Comment: (NOTE) KOH/Calcofluor preparation:  no fungus observed. Performed At: [facility] [address] [place], [place] [id number] Jolene SchimkeNagendra Sanjai MD VQ:[id number]:[phone]          Radiology Studies: Ct Abdomen Pelvis Wo Contrast  Result Date: 02/17/2019 CLINICAL DATA:  Abdominal pain. Patient has bowel perforation but but is not a surgical candidate according to the clinician's note. Patient also has dark maroon stool with acute blood loss anemia. EXAM: CT ABDOMEN AND PELVIS WITHOUT CONTRAST TECHNIQUE: Multidetector CT imaging of the abdomen and pelvis was performed following the standard protocol without IV contrast. COMPARISON:  Abdominal CT dated 02/08/2019 FINDINGS: Lower chest: Small bilateral pleural effusions with associated atelectasis. Hepatobiliary: The liver is shrunken with a nodular appearance, consistent with cirrhosis. Sequela of portal hypertension including paraesophageal varices, a splenorenal shunt, and splenomegaly are noted. No focal liver abnormality is seen. Gallstones layer dependently in the gallbladder. There is no gallbladder wall thickening or or biliary dilatation. Pancreas: Unremarkable. No pancreatic ductal dilatation or surrounding inflammatory changes. Spleen: The spleen is  enlarged. Adrenals/Urinary Tract: Adrenal glands are unremarkable. Bilateral renal cysts are redemonstrated, measuring up to 3.2 cm on the right. Otherwise, the kidneys are normal, without renal  calculi, focal lesion, or hydronephrosis. Bladder is unremarkable. Stomach/Bowel: Fluid along the lesser and greater curvatures of the stomach are noted. Free air is seen along underside of the liver near the hepatoduodenal ligament and false form ligament. Enteric contrast is seen in the small bowel. A rectal tube is in place. The appendix is not identified. A bowel containing right anterior abdominal wall hernia appears to extend close to the skin surface. There is no definite free fluid or wall thickening within the hernia sac to suggest strangulation. There is no evidence of bowel obstruction. No evidence of bowel wall thickening, distention, or inflammatory changes. Vascular/Lymphatic: Aortic atherosclerosis. Mildly enlarged mesenteric nodes are likely related to cirrhosis. Reproductive: Prostate is unremarkable. Other: Large volume free intraperitoneal fluid and pneumoperitoneum has increased significantly since prior exam. No hemoperitoneum is identified. Anasarca is noted. Musculoskeletal: Chronic degenerative changes are seen in the right hip. A chronic compression fracture of L1 with less than 25% height loss is unchanged. IMPRESSION: 1. Large volume free intraperitoneal fluid and pneumoperitoneum, increased significantly since prior exam. Free air along the under surface of the liver near the hepatoduodenal ligament suggests bowel perforation may be present near the pylorus or duodenum. 2. Small bilateral pleural effusions with associated atelectasis. 3. Hepatic cirrhosis with stigmata of portal hypertension. 4. Cholelithiasis. 5. Bowel containing right anterior abdominal wall hernia without evidence of strangulation or obstruction. Of note, the bowel lumen is in close proximity to the overlying skin. Aortic  Atherosclerosis (ICD10-I70.0). Electronically Signed   By: Zerita Boers M.D.   On: 02/17/2019 14:11   [place] Ekg Site Rite  Result Date: 02/18/2019 If Site Rite image not attached, placement could not be confirmed due to current cardiac rhythm.       Scheduled Meds: . benztropine mesylate  0.5 mg Intramuscular Daily  . hydrocortisone sod succinate (SOLU-CORTEF) inj  25 mg Intravenous Q12H  . insulin aspart  0-9 Units Subcutaneous Q4H  . midodrine  10 mg Oral TID WC  . pantoprazole (PROTONIX) IV  40 mg Intravenous Q12H   Continuous Infusions: . sodium chloride    . sodium chloride Stopped (02/12/19 1737)  . sodium chloride 1,000 mL (02/15/19 0811)  . piperacillin-tazobactam (ZOSYN)  IV 3.375 g (02/18/19 0923)     LOS: 10 days    Time spent: 35 minutes.     Elmarie Shiley, MD [facility]   If 7PM-7AM, please contact night-coverage [email or web address] Password [facility] 02/18/2019, 11:04 AM

## 2019-02-18 NOTE — Plan of Care (Signed)
  Problem: Education: Goal: Knowledge of General Education information will improve Description: Including pain rating scale, medication(s)/side effects and non-pharmacologic comfort measures Outcome: Not Progressing   Problem: Health Behavior/Discharge Planning: Goal: Ability to manage health-related needs will improve Outcome: Not Progressing   

## 2019-02-18 NOTE — Progress Notes (Addendum)
Subjective: CC: Patient asking for pain medication. States his abdomen hurts all over. No n/v. Wants to eat. He has as rectal tube in place with dark, blackish stool in bag  Objective: Vital signs in last 24 hours: Temp:  [97.4 F (36.3 C)-97.9 F (36.6 C)] 97.4 F (36.3 C) (11/22 0355) Pulse Rate:  [66-68] 67 (11/22 0355) Resp:  [16-18] 18 (11/22 0355) BP: (118-126)/(70-75) 120/70 (11/22 0355) SpO2:  [95 %-97 %] 97 % (11/22 0355) Last BM Date: 02/17/19(flexiseal)  Intake/Output from previous day: 11/21 0701 - 11/22 0700 In: -  Out: 200 [Urine:200] Intake/Output this shift: No intake/output data recorded.  PE: Gen:  Alert, NAD, pleasant Lungs: Normal rate and effort  Abd: Soft, distended, fluid wave, ascites, states he has pain all over but he has no guarding or rigidity. Umbilical hernia present. Hypoactive bowel sounds, rectal tube in place with black stool in bag.  Ext: 1+ LE edema  Lab Results:  Recent Labs    02/17/19 0304 02/18/19 0324  WBC 30.9* 31.7*  HGB 10.0* 10.2*  HCT 30.2* 31.5*  PLT 194 243   BMET Recent Labs    02/17/19 0800 02/18/19 0324  NA 133* 134*  K 3.9 3.5  CL 103 103  CO2 18* 19*  GLUCOSE 121* 94  BUN 46* 48*  CREATININE 2.71* 2.89*  CALCIUM 9.6 9.7   PT/INR No results for input(s): LABPROT, INR in the last 72 hours. CMP     Component Value Date/Time   NA 134 (L) 02/18/2019 0324   K 3.5 02/18/2019 0324   CL 103 02/18/2019 0324   CO2 19 (L) 02/18/2019 0324   GLUCOSE 94 02/18/2019 0324   BUN 48 (H) 02/18/2019 0324   CREATININE 2.89 (H) 02/18/2019 0324   CALCIUM 9.7 02/18/2019 0324   CALCIUM 8.5 09/30/2010 0437   PROT 6.0 (L) 02/18/2019 0324   PROT 5.4 (L) 11/02/2017 1147   ALBUMIN 2.5 (L) 02/18/2019 0324   AST 17 02/18/2019 0324   ALT 13 02/18/2019 0324   ALKPHOS 63 02/18/2019 0324   BILITOT 2.3 (H) 02/18/2019 0324   GFRNONAA 23 (L) 02/18/2019 0324   GFRAA 27 (L) 02/18/2019 0324   Lipase     Component Value  Date/Time   LIPASE 26 12/14/2018 0125       Studies/Results: Ct Abdomen Pelvis Wo Contrast  Result Date: 02/17/2019 CLINICAL DATA:  Abdominal pain. Patient has bowel perforation but but is not a surgical candidate according to the clinician's note. Patient also has dark maroon stool with acute blood loss anemia. EXAM: CT ABDOMEN AND PELVIS WITHOUT CONTRAST TECHNIQUE: Multidetector CT imaging of the abdomen and pelvis was performed following the standard protocol without IV contrast. COMPARISON:  Abdominal CT dated 02/08/2019 FINDINGS: Lower chest: Small bilateral pleural effusions with associated atelectasis. Hepatobiliary: The liver is shrunken with a nodular appearance, consistent with cirrhosis. Sequela of portal hypertension including paraesophageal varices, a splenorenal shunt, and splenomegaly are noted. No focal liver abnormality is seen. Gallstones layer dependently in the gallbladder. There is no gallbladder wall thickening or or biliary dilatation. Pancreas: Unremarkable. No pancreatic ductal dilatation or surrounding inflammatory changes. Spleen: The spleen is enlarged. Adrenals/Urinary Tract: Adrenal glands are unremarkable. Bilateral renal cysts are redemonstrated, measuring up to 3.2 cm on the right. Otherwise, the kidneys are normal, without renal calculi, focal lesion, or hydronephrosis. Bladder is unremarkable. Stomach/Bowel: Fluid along the lesser and greater curvatures of the stomach are noted. Free air is seen along underside  of the liver near the hepatoduodenal ligament and false form ligament. Enteric contrast is seen in the small bowel. A rectal tube is in place. The appendix is not identified. A bowel containing right anterior abdominal wall hernia appears to extend close to the skin surface. There is no definite free fluid or wall thickening within the hernia sac to suggest strangulation. There is no evidence of bowel obstruction. No evidence of bowel wall thickening, distention,  or inflammatory changes. Vascular/Lymphatic: Aortic atherosclerosis. Mildly enlarged mesenteric nodes are likely related to cirrhosis. Reproductive: Prostate is unremarkable. Other: Large volume free intraperitoneal fluid and pneumoperitoneum has increased significantly since prior exam. No hemoperitoneum is identified. Anasarca is noted. Musculoskeletal: Chronic degenerative changes are seen in the right hip. A chronic compression fracture of L1 with less than 25% height loss is unchanged. IMPRESSION: 1. Large volume free intraperitoneal fluid and pneumoperitoneum, increased significantly since prior exam. Free air along the under surface of the liver near the hepatoduodenal ligament suggests bowel perforation may be present near the pylorus or duodenum. 2. Small bilateral pleural effusions with associated atelectasis. 3. Hepatic cirrhosis with stigmata of portal hypertension. 4. Cholelithiasis. 5. Bowel containing right anterior abdominal wall hernia without evidence of strangulation or obstruction. Of note, the bowel lumen is in close proximity to the overlying skin. Aortic Atherosclerosis (ICD10-I70.0). Electronically Signed   By: Zerita Boers M.D.   On: 02/17/2019 14:11    Anti-infectives: Anti-infectives (From admission, onward)   Start     Dose/Rate Route Frequency Ordered Stop   02/08/19 2200  piperacillin-tazobactam (ZOSYN) IVPB 3.375 g     3.375 g 12.5 mL/hr over 240 Minutes Intravenous Every 8 hours 02/08/19 1552     02/08/19 1545  piperacillin-tazobactam (ZOSYN) IVPB 3.375 g     3.375 g 100 mL/hr over 30 Minutes Intravenous  Once 02/08/19 1533 02/08/19 1840       Assessment/Plan Schizoaffective disorder Chronic pain Hepatitis C CKDw/ AKI - Cr 2.89 ABL anemia - LDJ57.0 Umbilical hernia - no evidence of strangulation or obstruction on CT  Free intraperitoneal fluid and pneumoperitoneum  Perforation of distal stomach Child C Cirrhosis  GI bleed - CT 11/12 with a small amount  of free air on CT scan - S/Pparacentesis11/13, cultures, NGTD, fungal culture also pending with NGTD -UGIshowed focal contained perforation in the distal stomach. - CT 11/21 w/ large volume pneumoperitoneum with gas between liver and duodenum - Based on most recent labs, he is a Childs C cirrhotic. This placed the patient at a  peri-operative mortality of 82% for any abdominal surgery. I discussed this with him. He is without guarding on exam. Cont bowel rest and abx.   ID -Zosyn 11/12 >> VTE -SCDs FEN -IVF,NPO Foley -none Follow up -TBD    LOS: 10 days    Jillyn Ledger , [facility] 02/18/2019, 9:27 AM Please see Amion for pager number during day hours 7:00am-4:30pm

## 2019-02-18 NOTE — Progress Notes (Signed)
Peripherally Inserted Central Catheter/Midline Placement  The IV Nurse has discussed with the patient and/or persons authorized to consent for the patient, the purpose of this procedure and the potential benefits and risks involved with this procedure.  The benefits include less needle sticks, lab draws from the catheter, and the patient may be discharged home with the catheter. Risks include, but not limited to, infection, bleeding, blood clot (thrombus formation), and puncture of an artery; nerve damage and irregular heartbeat and possibility to perform a PICC exchange if needed/ordered by physician.  Alternatives to this procedure were also discussed.  Bard Power PICC patient education guide, fact sheet on infection prevention and patient information card has been provided to patient /or left at bedside.    PICC/Midline Placement Documentation  PICC Double Lumen 02/18/19 PICC Right Brachial 40 cm 0 cm (Active)  Indication for Insertion or Continuance of Line Prolonged intravenous therapies 02/18/19 1500  Exposed Catheter (cm) 0 cm 02/18/19 1500  Site Assessment Clean;Dry;Intact 02/18/19 1500  Lumen #1 Status Flushed;Blood return noted;Saline locked 02/18/19 1500  Lumen #2 Status Flushed;Blood return noted;Saline locked 02/18/19 1500  Dressing Type Transparent 02/18/19 1500  Dressing Status Clean;Dry;Intact 02/18/19 1500  Line Care Connections checked and tightened 02/18/19 1500  Dressing Change Due 02/25/19 02/18/19 1500       Lorenza Cambridge 02/18/2019, 3:48 PM

## 2019-02-18 NOTE — Plan of Care (Signed)

## 2019-02-18 NOTE — Progress Notes (Signed)
Pt has been agitated all night constantly yelling out and using the call bell constantly except for brief periods after he had Fentanyl and Haldol at the same time. He told me that he is going home today and that he can walk and take care of him self. He asked for "a shot"(pain med) repeatedly even immediately after I had given him one and even while I was giving him one.  If it was not time for his pain med and I explained that to him and told him when I could give him one, he continued to ask repeatedly for a shot. He has also asked for his Cogentin every time I came in the room even after I explained to him every time that the Cogentin is not due until 10 am. He just continued to say, "give it to me." Pt is alert and oriented to self and place despite numerous attempts to reorient him.

## 2019-02-18 NOTE — Progress Notes (Signed)
[facility] Progress Note    Assessment/ Plan:   1. AKI on CKD 3b - baseline creat 1.7- 2.0.  Renal US some ckd, UA negative, UNa < 10. Initial AKI thought to be due to HRS and cr stabilized out after albumin/ octreotide/ midodrine.   He is still continuing midodrine.  I think now the uptick in his Cr is reflective of multisystem organ failure.  I'll do a UA to make sure he doesn't have AIN from the Zosyn but I don't see anything that is reversible.  His prognosis is dim.  TPN has been ordered, Cr may improve with getting some nutrition in him but overall a poor picture.  He is not a dialysis candidate.    2. Perforated viscous - by CT scan on admit, likely perforated ulcer.  CT scan is much worse from 11/21 with fluid and air intraperitoneally.  On Zosyn and PPI.  82% intra-op mortality risk per notes.    3. Cirrhosis: Hep C and ascites  4. Schizoaffective d/o  5. Hx adrenal insuff - on steroids  6. Dispo: appreciate palliative care involvement.    Subjective:    Reconsult today for worsening kidney function.  Looks like Cr 2.5--> up to 2.8.  Records reviewed, has large volume peritoneum and ulcer is not expected to heal.  Asking for food.     Objective:   BP 120/70 (BP Location: Left Arm)   Pulse 67   Temp (!) 97.4 F (36.3 C) (Oral)   Resp 18   Ht 6\' 6"  (1.981 m)   Wt 109.6 kg   SpO2 97%   BMI 27.92 kg/m   Intake/Output Summary (Last 24 hours) at 02/18/2019 1323 Last data filed at 02/18/2019 1059 Gross per 24 hour  Intake -  Output 125 ml  Net -125 ml   Weight change:   Physical Exam: Gen: ill CVS: RRR Resp: clear anteriorly Abd: ++ distended with tympanic to percussion, somewhat tender Ext: 1+ LE edema  Imaging: Ct Abdomen Pelvis Wo Contrast  Result Date: 02/17/2019 CLINICAL DATA:  Abdominal pain. Patient has bowel perforation but but is not a surgical candidate according to the clinician's note. Patient also has dark maroon stool with acute  blood loss anemia. EXAM: CT ABDOMEN AND PELVIS WITHOUT CONTRAST TECHNIQUE: Multidetector CT imaging of the abdomen and pelvis was performed following the standard protocol without IV contrast. COMPARISON:  Abdominal CT dated 02/08/2019 FINDINGS: Lower chest: Small bilateral pleural effusions with associated atelectasis. Hepatobiliary: The liver is shrunken with a nodular appearance, consistent with cirrhosis. Sequela of portal hypertension including paraesophageal varices, a splenorenal shunt, and splenomegaly are noted. No focal liver abnormality is seen. Gallstones layer dependently in the gallbladder. There is no gallbladder wall thickening or or biliary dilatation. Pancreas: Unremarkable. No pancreatic ductal dilatation or surrounding inflammatory changes. Spleen: The spleen is enlarged. Adrenals/Urinary Tract: Adrenal glands are unremarkable. Bilateral renal cysts are redemonstrated, measuring up to 3.2 cm on the right. Otherwise, the kidneys are normal, without renal calculi, focal lesion, or hydronephrosis. Bladder is unremarkable. Stomach/Bowel: Fluid along the lesser and greater curvatures of the stomach are noted. Free air is seen along underside of the liver near the hepatoduodenal ligament and false form ligament. Enteric contrast is seen in the small bowel. A rectal tube is in place. The appendix is not identified. A bowel containing right anterior abdominal wall hernia appears to extend close to the skin surface. There is no definite free fluid or wall thickening within the hernia  sac to suggest strangulation. There is no evidence of bowel obstruction. No evidence of bowel wall thickening, distention, or inflammatory changes. Vascular/Lymphatic: Aortic atherosclerosis. Mildly enlarged mesenteric nodes are likely related to cirrhosis. Reproductive: Prostate is unremarkable. Other: Large volume free intraperitoneal fluid and pneumoperitoneum has increased significantly since prior exam. No hemoperitoneum  is identified. Anasarca is noted. Musculoskeletal: Chronic degenerative changes are seen in the right hip. A chronic compression fracture of L1 with less than 25% height loss is unchanged. IMPRESSION: 1. Large volume free intraperitoneal fluid and pneumoperitoneum, increased significantly since prior exam. Free air along the under surface of the liver near the hepatoduodenal ligament suggests bowel perforation may be present near the pylorus or duodenum. 2. Small bilateral pleural effusions with associated atelectasis. 3. Hepatic cirrhosis with stigmata of portal hypertension. 4. Cholelithiasis. 5. Bowel containing right anterior abdominal wall hernia without evidence of strangulation or obstruction. Of note, the bowel lumen is in close proximity to the overlying skin. Aortic Atherosclerosis (ICD10-I70.0). Electronically Signed   By: Romona Curls M.D.   On: 02/17/2019 14:11   [place] Ekg Site Rite  Result Date: 02/18/2019 If Site Rite image not attached, placement could not be confirmed due to current cardiac rhythm.   Labs: BMET Recent Labs  Lab 02/12/19 0444 02/13/19 0552 02/14/19 0619 02/15/19 0319 02/16/19 1141 02/17/19 0800 02/18/19 0324  NA 146* 146* 149* 136 135 133* 134*  K 3.3* 3.4* 3.3* 3.0* 3.2* 3.9 3.5  CL 113* 112* 114* 106 104 103 103  CO2 19* 19* 19* 17* 16* 18* 19*  GLUCOSE 91 97 78 94 105* 121* 94  BUN 49* 55* 55* 47* 46* 46* 48*  CREATININE 2.49* 2.59* 2.59* 2.33* 2.57* 2.71* 2.89*  CALCIUM 9.2 9.3 9.8 9.4 9.5 9.6 9.7  PHOS  --   --  2.6  --   --   --   --    CBC Recent Labs  Lab 02/13/19 0552 02/14/19 0619 02/15/19 0319 02/16/19 1141 02/17/19 0304 02/18/19 0324  WBC 3.5* 5.0 10.0 31.2* 30.9* 31.7*  NEUTROABS 2.9 4.3  --   --   --   --   HGB 8.0* 8.3* 8.7* 10.5* 10.0* 10.2*  HCT 24.5* 25.7* 26.7* 32.4* 30.2* 31.5*  MCV 92.8 92.1 91.4 91.0 90.4 91.0  PLT 74* 76* 103* 198 194 243    Medications:    . benztropine mesylate  0.5 mg Intramuscular Daily  .  hydrocortisone sod succinate (SOLU-CORTEF) inj  25 mg Intravenous Q12H  . insulin aspart  0-9 Units Subcutaneous Q4H  . midodrine  10 mg Oral TID WC  . pantoprazole (PROTONIX) IV  40 mg Intravenous Q12H      Bufford Buttner, MD 02/18/2019, 1:23 PM

## 2019-02-18 NOTE — Progress Notes (Signed)
PHARMACY - ADULT TOTAL PARENTERAL NUTRITION CONSULT NOTE   Pharmacy Consult for TPN Indication: Stomach perforation on bowel rest  Patient Measurements: Height: 6\' 6"  (198.1 cm) Weight: 241 lb 10 oz (109.6 kg) IBW/kg (Calculated) : 91.4 TPN AdjBW (KG): 107.4 Body mass index is 27.92 kg/m. Usual Weight: ~100kg  Assessment: 75 YOM presenting 11/14 with abdominal pain/distention and rectal bleeding, found with bowel perforation tx with medical management and bowel rest.  Started liquids on 11/18-19 now back on bowel rest and pharmacy consulted to start TPN.  Little PO intake since admission and prealbumin <5.    GI: CT 11/21 pneumoperitoneum - gas between liver/duodenum Endo: A1c 3.9: CBGs 80s- low 100s - also on solucortef Insulin requirements in the past 24 hours:  0  - on sens SSI Lytes: Na 134, others wnl Renal: CKD3> SCr 2.89 (BL 1.7-2), no UOP recorded Pulm: RA Cards: VSS - midodrine Hepatobil: Hx cirrhosis/ascites: Tbili 2.3, AST/ALT wnl, TG wnl Neuro: ID: Zosyn for peritonitis 11/13>>    TPN Access: PICC ordered to be placed today TPN start date: 11/22 Nutritional Goals (per RD recommendation on 11/17): KCal: 2450-2650 Protein: 125-145g Fluid: per MD  Goal TPN rate is 95 ml/hr  Current Nutrition:  NPO  Plan:  Start TPN at 40 mL/hr - watch for refeeding  This TPN provides 58 g of protein, 172 g of dextrose, and 24 g of lipids which provides 1057 kCals per day, meeting ~40% of patient needs Electrolytes in TPN: Standard with start Add MVI, trace elements Continue sensitive SSI and adjust as needed with TPN start TPN labs in AM  Bertis Ruddy, PharmD Clinical Pharmacist Please check AMION for all [place] numbers 02/18/2019 11:41 AM

## 2019-02-19 DIAGNOSIS — F251 Schizoaffective disorder, depressive type: Secondary | ICD-10-CM | POA: Diagnosis not present

## 2019-02-19 DIAGNOSIS — F4310 Post-traumatic stress disorder, unspecified: Secondary | ICD-10-CM | POA: Diagnosis not present

## 2019-02-19 DIAGNOSIS — Z515 Encounter for palliative care: Secondary | ICD-10-CM

## 2019-02-19 LAB — DIFFERENTIAL
Abs Immature Granulocytes: 0.35 10*3/uL — ABNORMAL HIGH (ref 0.00–0.07)
Basophils Absolute: 0.1 10*3/uL (ref 0.0–0.1)
Basophils Relative: 0 %
Eosinophils Absolute: 0 10*3/uL (ref 0.0–0.5)
Eosinophils Relative: 0 %
Immature Granulocytes: 1 %
Lymphocytes Relative: 4 %
Lymphs Abs: 1.2 10*3/uL (ref 0.7–4.0)
Monocytes Absolute: 1.5 10*3/uL — ABNORMAL HIGH (ref 0.1–1.0)
Monocytes Relative: 5 %
Neutro Abs: 25.9 10*3/uL — ABNORMAL HIGH (ref 1.7–7.7)
Neutrophils Relative %: 90 %

## 2019-02-19 LAB — COMPREHENSIVE METABOLIC PANEL
ALT: 12 U/L (ref 0–44)
AST: 16 U/L (ref 15–41)
Albumin: 2.6 g/dL — ABNORMAL LOW (ref 3.5–5.0)
Alkaline Phosphatase: 67 U/L (ref 38–126)
Anion gap: 13 (ref 5–15)
BUN: 52 mg/dL — ABNORMAL HIGH (ref 6–20)
CO2: 20 mmol/L — ABNORMAL LOW (ref 22–32)
Calcium: 9.8 mg/dL (ref 8.9–10.3)
Chloride: 102 mmol/L (ref 98–111)
Creatinine, Ser: 3.04 mg/dL — ABNORMAL HIGH (ref 0.61–1.24)
GFR calc Af Amer: 25 mL/min — ABNORMAL LOW (ref >60)
GFR calc non Af Amer: 22 mL/min — ABNORMAL LOW (ref >60)
Glucose, Bld: 115 mg/dL — ABNORMAL HIGH (ref 70–99)
Potassium: 3.3 mmol/L — ABNORMAL LOW (ref 3.5–5.1)
Sodium: 135 mmol/L (ref 135–145)
Total Bilirubin: 1.8 mg/dL — ABNORMAL HIGH (ref 0.3–1.2)
Total Protein: 6.3 g/dL — ABNORMAL LOW (ref 6.5–8.1)

## 2019-02-19 LAB — CBC
HCT: 33.9 % — ABNORMAL LOW (ref 39.0–52.0)
Hemoglobin: 11 g/dL — ABNORMAL LOW (ref 13.0–17.0)
MCH: 29.6 pg (ref 26.0–34.0)
MCHC: 32.4 g/dL (ref 30.0–36.0)
MCV: 91.1 fL (ref 80.0–100.0)
Platelets: 277 10*3/uL (ref 150–400)
RBC: 3.72 MIL/uL — ABNORMAL LOW (ref 4.22–5.81)
RDW: 19.5 % — ABNORMAL HIGH (ref 11.5–15.5)
WBC: 29.1 10*3/uL — ABNORMAL HIGH (ref 4.0–10.5)
nRBC: 0 % (ref 0.0–0.2)

## 2019-02-19 LAB — GLUCOSE, CAPILLARY
Glucose-Capillary: 107 mg/dL — ABNORMAL HIGH (ref 70–99)
Glucose-Capillary: 117 mg/dL — ABNORMAL HIGH (ref 70–99)
Glucose-Capillary: 127 mg/dL — ABNORMAL HIGH (ref 70–99)
Glucose-Capillary: 127 mg/dL — ABNORMAL HIGH (ref 70–99)
Glucose-Capillary: 130 mg/dL — ABNORMAL HIGH (ref 70–99)
Glucose-Capillary: 138 mg/dL — ABNORMAL HIGH (ref 70–99)

## 2019-02-19 LAB — PREALBUMIN: Prealbumin: <5 mg/dL — ABNORMAL LOW (ref 18–38)

## 2019-02-19 LAB — TRIGLYCERIDES: Triglycerides: 100 mg/dL (ref <150)

## 2019-02-19 LAB — MAGNESIUM: Magnesium: 2.3 mg/dL (ref 1.7–2.4)

## 2019-02-19 LAB — PHOSPHORUS: Phosphorus: 4.5 mg/dL (ref 2.5–4.6)

## 2019-02-19 MED ORDER — POTASSIUM CHLORIDE 10 MEQ/50ML IV SOLN
10.0000 meq | INTRAVENOUS | Status: AC
  Administered 2019-02-19 (×2): 10 meq via INTRAVENOUS
  Filled 2019-02-19 (×2): qty 50

## 2019-02-19 MED ORDER — TRAVASOL 10 % IV SOLN
INTRAVENOUS | Status: DC
  Filled 2019-02-19: qty 1008

## 2019-02-19 MED ORDER — LORAZEPAM 2 MG/ML IJ SOLN
0.5000 mg | Freq: Once | INTRAMUSCULAR | Status: AC
  Administered 2019-02-19: 01:00:00 0.5 mg via INTRAVENOUS
  Filled 2019-02-19: qty 1

## 2019-02-19 MED ORDER — TRAVASOL 10 % IV SOLN
INTRAVENOUS | Status: AC
  Administered 2019-02-19: 17:00:00 via INTRAVENOUS
  Filled 2019-02-19: qty 1008

## 2019-02-19 NOTE — Progress Notes (Addendum)
PHARMACY - ADULT TOTAL PARENTERAL NUTRITION CONSULT NOTE   Pharmacy Consult for TPN Indication: Stomach perforation on bowel rest  Patient Measurements: Height: 6\' 6"  (198.1 cm) Weight: 251 lb 12.3 oz (114.2 kg) IBW/kg (Calculated) : 91.4 TPN AdjBW (KG): 107.4 Body mass index is 29.09 kg/m. Usual Weight: ~100kg  Assessment: 51 YOM presenting 11/14 with abdominal pain/distention and rectal bleeding, found with bowel perforation tx with medical management and bowel rest.  Paracentesis 11/13. Started liquids on 11/18-19 now back on bowel rest and pharmacy consulted to start TPN.  Little PO intake since admission and prealbumin <5.    GI: wrosened CT 11/21 pneumoperitoneum - gas between liver/duodenum. Perforation of distal stomach, GIB. Umbilical hernia note-stable. Rectal tube with LBM 11/22. Prealbumin <5. - IV PPI q 12 hrs  Endo: A1c 3.9: CBGs WNL - also on solucortef Insulin requirements in the past 24 hours:  3 units  - on sens SSI Lytes: K 3.3 low, low bicarb 20, Mg/Phos on high end of normal. Renal: CKD3> SCr 2.89>3.04 (BL 1.7-2), UOP minimal recorded Pulm: RA Cards: VSS - midodrine TID Hepatobil: Hx cirrhosis Child C/ascites: Tbili 2.3>1.8 down, AST/ALT wnl, TG wnl 100 Neuro: Schizoaffective disorder, chronic pain on Bentropine IM daily, Intermittently agitated. ID: Zosyn for peritonitis 11/13>>  . Afebrile. WBC elevated  TPN Access: PICC 11/22 TPN start date: 11/22 Nutritional Goals (per RD recommendation on 11/17): KCal: 2450-2650 Protein: 125-145g Fluid: per MD Goal TPN rate is 95 ml/hr  Current Nutrition:  NPO  Plan:  Increase TPN to 40ml/hr This TPN provides 100.8 g of protein, 302 g of dextrose, and 42 g of lipids which provides 1851 kCals per day. Electrolytes in TPN: Supplement K+, increase bicarb, no Mg or Phos or Ca Add MVI, trace elements--removed for today, out of stock Continue sensitive SSI and adjust as needed with TPN start TPN labs MonThurs and  prn K 35meq IV x 2  [name], PharmD, [place] Clinical Staff Pharmacist [phone] 02/19/2019 9:28 AM

## 2019-02-19 NOTE — Progress Notes (Signed)
PMT goals of care meeting tomorrow (11/24) at 11:00 am with [facility] and two other close family friends.  [name], PA-C Palliative Medicine Office:  [phone]

## 2019-02-19 NOTE — Progress Notes (Signed)
Physical Therapy Treatment Patient Details Name: [name] MRN: [id number] DOB: 03-Aug-1960 Today's Date: 02/19/2019    History of Present Illness Pt is a 58 y/o male admitted secondary to abdominal pain, distention and rectal bleeding. Pt found to have a bowel perforation. Per surgical team, no surgery indicated as pt is a "poor surgical candidate". Paracentesis completed on 11/18 with 5L removed. PMH including but not limited to cirrhosis, hep C, schizoaffective disorder, PTSD and CKD.    PT Comments    Pt awake, alert but not oriented to situation or date. Pt following single step commands inconsistently with pt stating "get me out of here" " please wheelchair". Pt able to participate with sitting EOB and attempt at standing but unable to safely transfer to chair at this time. Pt with noted abdominal distension and will follow acutely as pt able to participate and progress.     Follow Up Recommendations  SNF;Supervision/Assistance - 24 hour     Equipment Recommendations  Hospital bed    Recommendations for Other Services       Precautions / Restrictions Precautions Precautions: Fall Precaution Comments: rectal tube    Mobility  Bed Mobility Overal bed mobility: Needs Assistance Bed Mobility: Supine to Sit;Sit to Supine Rolling: Max assist;+2 for physical assistance   Supine to sit: Max assist Sit to supine: Max assist   General bed mobility comments: max assist with bil UE support for pt to lift trunk and pivot to EOB as pt stating "help me" and reaching for therapist with both arms, unable to be redirected for sequence. REturn to bed with assist to bring legs to surface with multimodal cueing to lower trunk to surface  Transfers Overall transfer level: Needs assistance   Transfers: Sit to/from Stand Sit to Stand: Max assist;+2 physical assistance         General transfer comment: max +2 assist to rise from surface x 2 out of 4 trials with pt able to partially  clear sacrum with maintained crouched posture. left knee blocked, max hand over hand assist for hand placement on supported RW as pt trying to wrap arms around staff and would not push from surface. Pt able to partial stand for a few seconds but could not achieve fully upright or weight shift for transfer.  Ambulation/Gait             General Gait Details: unable   Stairs             Wheelchair Mobility    Modified Rankin (Stroke Patients Only)       Balance Overall balance assessment: Needs assistance Sitting-balance support: Feet supported;Single extremity supported Sitting balance-Leahy Scale: Fair     Standing balance support: Bilateral upper extremity supported Standing balance-Leahy Scale: Poor Standing balance comment: physical assist to rise and bil UE support                            Cognition Arousal/Alertness: Awake/alert Behavior During Therapy: Flat affect;Restless Overall Cognitive Status: Difficult to assess Area of Impairment: Orientation;Memory;Following commands;Safety/judgement                 Orientation Level: Disoriented to;Situation;Time   Memory: Decreased short-term memory Following Commands: Follows one step commands inconsistently Safety/Judgement: Decreased awareness of safety;Decreased awareness of deficits   Problem Solving: Decreased initiation;Difficulty sequencing;Requires verbal cues;Requires tactile cues General Comments: pt able to state name, birthdate and place and eventually able to use caledar to state the  23rd but could not provide month or year. Pt perseverative on getting WC.      Exercises      General Comments        Pertinent Vitals/Pain Pain Assessment: No/denies pain    Home Living                      Prior Function            PT Goals (current goals can now be found in the care plan section) Progress towards PT goals: Progressing toward goals    Frequency    Min  2X/week      PT Plan Current plan remains appropriate    Co-evaluation              AM-PAC PT "6 Clicks" Mobility   Outcome Measure  Help needed turning from your back to your side while in a flat bed without using bedrails?: Total Help needed moving from lying on your back to sitting on the side of a flat bed without using bedrails?: Total Help needed moving to and from a bed to a chair (including a wheelchair)?: Total Help needed standing up from a chair using your arms (e.g., wheelchair or bedside chair)?: Total Help needed to walk in hospital room?: Total Help needed climbing 3-5 steps with a railing? : Total 6 Click Score: 6    End of Session Equipment Utilized During Treatment: Gait belt Activity Tolerance: Patient tolerated treatment well Patient left: in bed;with call bell/phone within reach;with bed alarm set Nurse Communication: Mobility status;Need for lift equipment PT Visit Diagnosis: Other abnormalities of gait and mobility (R26.89)     Time: 9485-4627 PT Time Calculation (min) (ACUTE ONLY): 16 min  Charges:  $Therapeutic Activity: 8-22 mins                     Story Conti P, PT Acute Rehabilitation Services Pager: [phone] Office: [phone]    Enedina Finner Roben Schliep 02/19/2019, 12:31 PM

## 2019-02-19 NOTE — Plan of Care (Signed)
  Problem: Education: Goal: Knowledge of General Education information will improve Description Including pain rating scale, medication(s)/side effects and non-pharmacologic comfort measures Outcome: Progressing   Problem: Health Behavior/Discharge Planning: Goal: Ability to manage health-related needs will improve Outcome: Progressing   Problem: Clinical Measurements: Goal: Ability to maintain clinical measurements within normal limits will improve Outcome: Progressing   Problem: Elimination: Goal: Will not experience complications related to bowel motility Outcome: Progressing Goal: Will not experience complications related to urinary retention Outcome: Progressing   Problem: Pain Managment: Goal: General experience of comfort will improve Outcome: Progressing   Problem: Safety: Goal: Ability to remain free from injury will improve Outcome: Progressing   Problem: Skin Integrity: Goal: Risk for impaired skin integrity will decrease Outcome: Progressing   

## 2019-02-19 NOTE — Progress Notes (Signed)
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 - CT 11/21 w/ large volume pneumoperitoneum with gas between liver and duodenum -Based on most recent labs, he is a Childs C cirrhotic. This placed the patient at a  peri-operative mortality of 82% for any abdominal surgery. Cont bowel rest and abx.  - palliative care following  ID -Zosyn 11/12 >> VTE -SCDs FEN -IVF,NPO, TNA Foley -none Follow up -TBD   LOS: 11 days    Subjective: CC: no complaints  Pt is more somnolent today. He denies abdominal pain but guards on exam. No other complaints.    Objective: Vital signs in last 24 hours: Temp:  [97.4 F (36.3 C)-98.2 F (36.8 C)] 98.2 F (36.8 C) (11/23 0417) Pulse Rate:  [75-87] 87 (11/23 0417) Resp:  [16-17] 17 (11/23 0417) BP: (122-135)/(64-78) 135/78 (11/23 0417) SpO2:  [96 %-97 %] 96 % (11/23 0417) Weight:  [169.6 kg] 114.2 kg (11/23 0500) Last BM Date: 02/18/19  Intake/Output from previous day: 11/22 0701 - 11/23 0700 In: -  Out: 225 [Urine:225] Intake/Output this shift: No intake/output data recorded.  PE:  Gen:  Alert, NAD, pleasant, ill appearing Pulm:  Rate and effort normal Abd: Soft,distended, fluid wave and tympanic, no TTP per pt but he has guarding, Umbilical hernia present. Hypoactive bowel sounds Skin: no rashes noted, warm and dry   Anti-infectives: Anti-infectives (From admission, onward)   Start     Dose/Rate Route Frequency Ordered Stop   02/08/19 2200  piperacillin-tazobactam (ZOSYN) IVPB 3.375 g     3.375 g 12.5 mL/hr over 240 Minutes Intravenous Every 8 hours 02/08/19 1552     02/08/19 1545  piperacillin-tazobactam (ZOSYN) IVPB 3.375 g     3.375 g 100 mL/hr over 30 Minutes Intravenous  Once 02/08/19 1533 02/08/19 1840      Lab Results:  Recent Labs    02/18/19 0324 02/19/19 0556  WBC 31.7* 29.1*  HGB 10.2* 11.0*  HCT 31.5* 33.9*  PLT 243 277   BMET Recent Labs    02/17/19 0800 02/18/19 0324  NA 133* 134*  K 3.9 3.5  CL 103 103  CO2 18* 19*  GLUCOSE 121* 94  BUN 46* 48*  CREATININE 2.71* 2.89*  CALCIUM 9.6 9.7   PT/INR No results for input(s): LABPROT, INR in the last 72 hours. CMP     Component Value Date/Time   NA 134 (L) 02/18/2019 0324   K 3.5 02/18/2019 0324   CL 103 02/18/2019 0324   CO2 19 (L) 02/18/2019 0324   GLUCOSE 94 02/18/2019 0324   BUN 48 (H) 02/18/2019 0324   CREATININE 2.89 (H) 02/18/2019 0324   CALCIUM 9.7 02/18/2019 0324   CALCIUM 8.5 09/30/2010 0437   PROT 6.0 (L) 02/18/2019 0324   PROT 5.4 (L) 11/02/2017 1147   ALBUMIN 2.5 (L) 02/18/2019 0324   AST 17 02/18/2019 0324   ALT 13 02/18/2019 0324   ALKPHOS 63 02/18/2019 0324   BILITOT 2.3 (H) 02/18/2019 0324   GFRNONAA 23 (L) 02/18/2019 0324   GFRAA 27 (L) 02/18/2019 7893  Lipase     Component Value Date/Time   LIPASE 26 12/14/2018 0125    Studies/Results: Ct Abdomen Pelvis Wo Contrast  Result Date: 02/17/2019 CLINICAL DATA:  Abdominal pain. Patient has bowel perforation but but is not a surgical candidate according to the clinician's note. Patient also has dark maroon stool with acute blood loss anemia. EXAM: CT ABDOMEN AND PELVIS WITHOUT CONTRAST TECHNIQUE: Multidetector CT imaging of the abdomen and pelvis was performed following the standard protocol without IV contrast. COMPARISON:  Abdominal CT dated 02/08/2019 FINDINGS: Lower chest: Small bilateral pleural effusions with associated atelectasis. Hepatobiliary:  The liver is shrunken with a nodular appearance, consistent with cirrhosis. Sequela of portal hypertension including paraesophageal varices, a splenorenal shunt, and splenomegaly are noted. No focal liver abnormality is seen. Gallstones layer dependently in the gallbladder. There is no gallbladder wall thickening or or biliary dilatation. Pancreas: Unremarkable. No pancreatic ductal dilatation or surrounding inflammatory changes. Spleen: The spleen is enlarged. Adrenals/Urinary Tract: Adrenal glands are unremarkable. Bilateral renal cysts are redemonstrated, measuring up to 3.2 cm on the right. Otherwise, the kidneys are normal, without renal calculi, focal lesion, or hydronephrosis. Bladder is unremarkable. Stomach/Bowel: Fluid along the lesser and greater curvatures of the stomach are noted. Free air is seen along underside of the liver near the hepatoduodenal ligament and false form ligament. Enteric contrast is seen in the small bowel. A rectal tube is in place. The appendix is not identified. A bowel containing right anterior abdominal wall hernia appears to extend close to the skin surface. There is no definite free fluid or wall thickening within the hernia sac to suggest strangulation. There is no evidence of bowel obstruction. No evidence of bowel wall thickening, distention, or inflammatory changes. Vascular/Lymphatic: Aortic atherosclerosis. Mildly enlarged mesenteric nodes are likely related to cirrhosis. Reproductive: Prostate is unremarkable. Other: Large volume free intraperitoneal fluid and pneumoperitoneum has increased significantly since prior exam. No hemoperitoneum is identified. Anasarca is noted. Musculoskeletal: Chronic degenerative changes are seen in the right hip. A chronic compression fracture of L1 with less than 25% height loss is unchanged. IMPRESSION: 1. Large volume free intraperitoneal fluid and pneumoperitoneum, increased significantly since prior exam. Free air along the under  surface of the liver near the hepatoduodenal ligament suggests bowel perforation may be present near the pylorus or duodenum. 2. Small bilateral pleural effusions with associated atelectasis. 3. Hepatic cirrhosis with stigmata of portal hypertension. 4. Cholelithiasis. 5. Bowel containing right anterior abdominal wall hernia without evidence of strangulation or obstruction. Of note, the bowel lumen is in close proximity to the overlying skin. Aortic Atherosclerosis (ICD10-I70.0). Electronically Signed   By: Romona Curls M.D.   On: 02/17/2019 14:11   [place] Ekg Site Rite  Result Date: 02/18/2019 If Site Rite image not attached, placement could not be confirmed due to current cardiac rhythm.    Jerre Simon, PA-C [facility] Please see amion for pager for the following: Venita Lick, W, & Friday 7:00am - 4:30pm Thursdays 7:00am -11:30am

## 2019-02-19 NOTE — TOC Progression Note (Signed)
Transition of Care [facility]) - Progression Note    Patient Details  Name: [name] MRN: [id number] Date of Birth: 03-14-1961  Transition of Care [facility] CM/SW [place], [place] Phone Number: 02/19/2019, 1:47 PM  Clinical Narrative:    CSW received a call from pt friend Daleen Snook at [phone]. Daleen Snook explained that she served as an Astronomer for the pt and pt mother for over a decade and there is a small group of friends in the community that look out for each other and when pt's parents died these friends banded together to support the pt. She states that she has a key to pt apartment that she can use to get anything for pt or that we may need. Per Daleen Snook pt does not have a guardian any longer- pt regained his own guardian from the courts. Per Daleen Snook pt does not have HCPOA but he does have a close group of friends that can assist with decision making. Daleen Snook has several questions regarding medical status. Will pass on number to MD and PMT to f/u with her for further disposition planning.    Expected Discharge Plan: Skilled Nursing Facility Barriers to Discharge: Family Issues, Continued Medical Work up  Expected Discharge Plan and Services Expected Discharge Plan: [place] In-house Referral: Clinical Social Work Discharge Planning Services: CM Consult   Living arrangements for the past 2 months: Apartment   Social Determinants of Health (SDOH) Interventions    Readmission Risk Interventions Readmission Risk Prevention Plan 01/11/2019  Transportation Screening Complete  Medication Review Press photographer) Complete  PCP or Specialist appointment within 3-5 days of discharge Complete  HRI or [place] Complete  SW Recovery Care/Counseling Consult Complete  [place] Not Applicable  Some recent data might be hidden

## 2019-02-19 NOTE — Consult Note (Signed)
[facility] Face[facility]to[facility] Consult   Reason for Consult:  Suicidal ideations Referring Physician:  Dr Carmell Austriaegaldo Patient Identification: Benjamin Pulsednan Hull MRN:  [id number] Principal Diagnosis: Bowel perforation [facility](HCC) Diagnosis:  Principal Problem:   Bowel perforation (HCC) Active Problems:   Schizoaffective disorder (HCC)   PTSD (post-traumatic stress disorder)   History of adrenal insufficiency   GI bleed   Cirrhosis (HCC)   Esophageal varices in cirrhosis (HCC)   CKD (chronic kidney disease) stage 3, GFR 30-59 ml/min   Abdominal pain   Bleeding per rectum   Septic shock (HCC)   Central venous catheter in place   Goals of care, counseling/discussion   Palliative care by specialist   AKI (acute kidney injury) (HCC)   Total Time spent with patient: 1 hour  Subjective:   Benjamin Pulsednan Hull is a 58 y.o. male patient admitted with medical issues, consult for capacity.    Patient seen and evaluated in person by this provider.  On assessment, he was able to identify [facility] but not date or time.  He states when he leaves he will return home.  He also states that he can walk and take care of himself which is not sure.  When asked who he lives with, he responded, "relatives."  No concept of his physical limitations nor his ability to care for himself nor the severity of his medical conditions.  Denies suicidal/homicidal ideations, hallucinations, and substance abuse.  He does not have capacity to make sound medical decisions at this point.  HPI per NF:AOZHYD:[name] is a 58 y.o. male with medical history significant of cirrhosis with splenomegaly, esophageal varices, hepatitis C, adrenal insufficiency, hypothyroidism, schizoaffective disorder, PTSD, GI bleed, neuropathy, chronic kidney disease presents to emergency department due to worsening abdominal pain, distention and rectal bleeding since 1 day.  Upon my evaluation: Patient received morphine and Ativan in the ED.  He is sleepy but  arousable.  Reports severe abdominal pain, distention and rectal bleeding started yesterday.  Denies nausea, vomiting, fever, chills, chest pain, shortness of breath, palpitation, hematemesis, decreased appetite.  He had EGD on 01/10/2019 which showed  Grade D acute esophagitis, Very large, non-bleeding gastric ulcer in antrum with no stigmata of bleeding. Non-bleeding duodenal ulcers.   Past Psychiatric History: depression, schizoaffective d/o., PTSD  Risk to Self:  none Risk to Others:  none Prior Inpatient Therapy:  yes Prior Outpatient Therapy:  yes  Past Medical History:  Past Medical History:  Diagnosis Date  . Anemia   . Ascites   . Chronic leg pain   . Cirrhosis (HCC)   . DU (perforated duodenal ulcer) (HCC) 02/13/2019   sealed perforated duodenal ulcer  . GERD (gastroesophageal reflux disease)   . H/O diabetes insipidus   . Hepatitis C    Completed therapy with [facility] ~ 2018.  suspect therapy over[place] [facility] [place]  . History of adrenal insufficiency   . History of ARDS   . History of blood transfusion   . Hypothyroidism   . Kidney disease    stage 3  . Neuropathy   . Overdose 12/2009  . Peripheral neuropathy   . Personality disorder (HCC)   . PTSD (post-traumatic stress disorder)    [place]  . Schizoaffective disorder [facility](HCC)     Past Surgical History:  Procedure Laterality Date  . BIOPSY  01/10/2019   Procedure: BIOPSY;  Surgeon: Beverley FiedlerPyrtle, Jay M, MD;  [facility];  Service: Endoscopy;;  .  CERVICAL SPINE SURGERY  unknown  . ESOPHAGOGASTRODUODENOSCOPY (EGD) WITH PROPOFOL N/A 01/10/2019   Procedure: ESOPHAGOGASTRODUODENOSCOPY (EGD) WITH PROPOFOL;  Surgeon: Jerene Bears, MD;  Location: [place] [facility]COPY;  Service: Endoscopy;  Laterality: N/A;  . IR PARACENTESIS  02/14/2019  . RIGHT HIP SURGERY  1996  . US GUIDED LEFT THORACENTESIS  01/21/2010   Family History:  Family History  Problem Relation Age of  Onset  . Cancer Mother   . Heart attack Father    Family Psychiatric  History: nond Social History:  Social History   Substance and Sexual Activity  Alcohol Use No   Comment: Pt denies      Social History   Substance and Sexual Activity  Drug Use No   Comment: Pt denies    Social History   Socioeconomic History  . Marital status: Single    Spouse name: Not on file  . Number of children: 0  . Years of education: 29  . Highest education level: Not on file  Occupational History    Comment: umemployed  Social Needs  . Financial resource strain: Not on file  . Food insecurity    Worry: Not on file    Inability: Not on file  . Transportation needs    Medical: Not on file    Non-medical: Not on file  Tobacco Use  . Smoking status: Current Every Day Smoker    Packs/day: 1.00    Years: 25.00    Pack years: 25.00    Types: Cigarettes  . Smokeless tobacco: Never Used  Substance and Sexual Activity  . Alcohol use: No    Comment: Pt denies   . Drug use: No    Comment: Pt denies  . Sexual activity: Not Currently  Lifestyle  . Physical activity    Days per week: Not on file    Minutes per session: Not on file  . Stress: Not on file  Relationships  . Social Herbalist on phone: Not on file    Gets together: Not on file    Attends religious service: Not on file    Active member of club or organization: Not on file    Attends meetings of clubs or organizations: Not on file    Relationship status: Not on file  Other Topics Concern  . Not on file  Social History Narrative   Single, Lives with mother   Emigrated from [place] during/after [place], no drugs, denies recent EtOH   Unemployed   Medicaid [employer]   Additional Social History:    Allergies:  No Known Allergies  Labs:  Results for orders placed or performed during the hospital encounter of 02/08/19 (from the past 48 hour(s))  Glucose, capillary     Status:  Abnormal   Collection Time: 02/17/19  8:17 PM  Result Value Ref Range   Glucose-Capillary 106 (H) 70 - 99 mg/dL  Glucose, capillary     Status: None   Collection Time: 02/18/19  1:04 AM  Result Value Ref Range   Glucose-Capillary 97 70 - 99 mg/dL  CBC     Status: Abnormal   Collection Time: 02/18/19  3:24 AM  Result Value Ref Range   WBC 31.7 (H) 4.0 - 10.5 K/uL   RBC 3.46 (L) 4.22 - 5.81 MIL/uL   Hemoglobin 10.2 (L) 13.0 - 17.0 g/dL   HCT 31.5 (L) 39.0 - 52.0 %   MCV 91.0 80.0 - 100.0 fL   MCH  29.5 26.0 - 34.0 pg   MCHC 32.4 30.0 - 36.0 g/dL   RDW 63.8 (H) 17.7 - 11.6 %   Platelets 243 150 - 400 K/uL   nRBC 0.0 0.0 - 0.2 %    Comment: Performed at [facility], [address] [place], [place] [address]  Comprehensive metabolic panel     Status: Abnormal   Collection Time: 02/18/19  3:24 AM  Result Value Ref Range   Sodium 134 (L) 135 - 145 mmol/L   Potassium 3.5 3.5 - 5.1 mmol/L   Chloride 103 98 - 111 mmol/L   CO2 19 (L) 22 - 32 mmol/L   Glucose, Bld 94 70 - 99 mg/dL   BUN 48 (H) 6 - 20 mg/dL   Creatinine, Ser 8.33 (H) 0.61 - 1.24 mg/dL   Calcium 9.7 8.9 - 38.3 mg/dL   Total Protein 6.0 (L) 6.5 - 8.1 g/dL   Albumin 2.5 (L) 3.5 - 5.0 g/dL   AST 17 15 - 41 U/L   ALT 13 0 - 44 U/L   Alkaline Phosphatase 63 38 - 126 U/L   Total Bilirubin 2.3 (H) 0.3 - 1.2 mg/dL   GFR calc non Af Amer 23 (L) >60 mL/min   GFR calc Af Amer 27 (L) >60 mL/min   Anion gap 12 5 - 15    Comment: Performed at [facility], [address] [place], [place] [address]  Glucose, capillary     Status: None   Collection Time: 02/18/19  3:25 AM  Result Value Ref Range   Glucose-Capillary 84 70 - 99 mg/dL  Glucose, capillary     Status: None   Collection Time: 02/18/19  7:48 AM  Result Value Ref Range   Glucose-Capillary 86 70 - 99 mg/dL  Glucose, capillary     Status: None   Collection Time: 02/18/19 12:34 PM  Result Value Ref Range   Glucose-Capillary 85 70 - 99 mg/dL  Glucose,  capillary     Status: None   Collection Time: 02/18/19  5:06 PM  Result Value Ref Range   Glucose-Capillary 82 70 - 99 mg/dL  Urinalysis, Routine w reflex microscopic     Status: Abnormal   Collection Time: 02/18/19  6:39 PM  Result Value Ref Range   Color, Urine YELLOW YELLOW   APPearance HAZY (A) CLEAR   Specific Gravity, Urine 1.019 1.005 - 1.030   pH 5.0 5.0 - 8.0   Glucose, UA NEGATIVE NEGATIVE mg/dL   Hgb urine dipstick NEGATIVE NEGATIVE   Bilirubin Urine NEGATIVE NEGATIVE   Ketones, ur NEGATIVE NEGATIVE mg/dL   Protein, ur NEGATIVE NEGATIVE mg/dL   Nitrite NEGATIVE NEGATIVE   Leukocytes,Ua NEGATIVE NEGATIVE    Comment: Performed at [facility], [address], [place], [place] [address]  Glucose, capillary     Status: Abnormal   Collection Time: 02/18/19  7:42 PM  Result Value Ref Range   Glucose-Capillary 105 (H) 70 - 99 mg/dL  Glucose, capillary     Status: Abnormal   Collection Time: 02/19/19 12:17 AM  Result Value Ref Range   Glucose-Capillary 138 (H) 70 - 99 mg/dL  Glucose, capillary     Status: Abnormal   Collection Time: 02/19/19  4:19 AM  Result Value Ref Range   Glucose-Capillary 127 (H) 70 - 99 mg/dL  Comprehensive metabolic panel     Status: Abnormal   Collection Time: 02/19/19  5:56 AM  Result Value Ref Range   Sodium  135 135 - 145 mmol/L   Potassium 3.3 (L) 3.5 - 5.1 mmol/L   Chloride 102 98 - 111 mmol/L   CO2 20 (L) 22 - 32 mmol/L   Glucose, Bld 115 (H) 70 - 99 mg/dL   BUN 52 (H) 6 - 20 mg/dL   Creatinine, Ser 8.65 (H) 0.61 - 1.24 mg/dL   Calcium 9.8 8.9 - 78.4 mg/dL   Total Protein 6.3 (L) 6.5 - 8.1 g/dL   Albumin 2.6 (L) 3.5 - 5.0 g/dL   AST 16 15 - 41 U/L   ALT 12 0 - 44 U/L   Alkaline Phosphatase 67 38 - 126 U/L   Total Bilirubin 1.8 (H) 0.3 - 1.2 mg/dL   GFR calc non Af Amer 22 (L) >60 mL/min   GFR calc Af Amer 25 (L) >60 mL/min   Anion gap 13 5 - 15    Comment: Performed at [facility], [address], [place], [place]  [address]  Prealbumin     Status: Abnormal   Collection Time: 02/19/19  5:56 AM  Result Value Ref Range   Prealbumin <5 (L) 18 - 38 mg/dL    Comment: Performed at [facility], [address], [place], [place] [address]  Magnesium     Status: None   Collection Time: 02/19/19  5:56 AM  Result Value Ref Range   Magnesium 2.3 1.7 - 2.4 mg/dL    Comment: Performed at [facility], [address] [place], [place] [address]  Phosphorus     Status: None   Collection Time: 02/19/19  5:56 AM  Result Value Ref Range   Phosphorus 4.5 2.5 - 4.6 mg/dL    Comment: Performed at [facility], [address] [place], [place] [address]  CBC     Status: Abnormal   Collection Time: 02/19/19  5:56 AM  Result Value Ref Range   WBC 29.1 (H) 4.0 - 10.5 K/uL   RBC 3.72 (L) 4.22 - 5.81 MIL/uL   Hemoglobin 11.0 (L) 13.0 - 17.0 g/dL   HCT 53.6 (L) 64.4 - 03.4 %   MCV 91.1 80.0 - 100.0 fL   MCH 29.6 26.0 - 34.0 pg   MCHC 32.4 30.0 - 36.0 g/dL   RDW 74.2 (H) 59.5 - 63.8 %   Platelets 277 150 - 400 K/uL   nRBC 0.0 0.0 - 0.2 %    Comment: Performed at [facility], [address], [place], [place] [address]  Differential     Status: Abnormal   Collection Time: 02/19/19  5:56 AM  Result Value Ref Range   Neutrophils Relative % 90 %   Neutro Abs 25.9 (H) 1.7 - 7.7 K/uL   Lymphocytes Relative 4 %   Lymphs Abs 1.2 0.7 - 4.0 K/uL   Monocytes Relative 5 %   Monocytes Absolute 1.5 (H) 0.1 - 1.0 K/uL   Eosinophils Relative 0 %   Eosinophils Absolute 0.0 0.0 - 0.5 K/uL   Basophils Relative 0 %   Basophils Absolute 0.1 0.0 - 0.1 K/uL   Immature Granulocytes 1 %   Abs Immature Granulocytes 0.35 (H) 0.00 - 0.07 K/uL   Polychromasia PRESENT     Comment: Performed at [facility], [address], [place], [place] [address]  Triglycerides     Status: None   Collection Time: 02/19/19  5:56 AM  Result Value Ref Range   Triglycerides 100 <150 mg/dL    Comment:  Performed at [facility], [address] [place], [place] [address]  Glucose, capillary     Status: Abnormal   Collection Time: 02/19/19  8:17 AM  Result Value Ref Range   Glucose-Capillary 130 (H) 70 - 99 mg/dL  Glucose, capillary     Status: Abnormal   Collection Time: 02/19/19 12:30 PM  Result Value Ref Range   Glucose-Capillary 127 (H) 70 - 99 mg/dL  Glucose, capillary     Status: Abnormal   Collection Time: 02/19/19  4:19 PM  Result Value Ref Range   Glucose-Capillary 117 (H) 70 - 99 mg/dL    Current Facility-Administered Medications  Medication Dose Route Frequency Provider Last Rate Last Dose  . 0.9 %  sodium chloride infusion   Intra-arterial PRN Sherryll BurgerShah, Pratik D, DO      . 0.9 %  sodium chloride infusion  250 mL Intravenous Continuous Oretha MilchAlva, Rakesh V, MD   Stopped at 02/12/19 1737  . 0.9 %  sodium chloride infusion   Intravenous PRN Hughie ClossPahwani, Ravi, MD 10 mL/hr at 02/15/19 0811 1,000 mL at 02/15/19 0811  . benztropine mesylate (COGENTIN) injection 0.5 mg  0.5 mg Intramuscular Daily Regalado, Belkys A, MD   0.5 mg at 02/19/19 1000  . Chlorhexidine Gluconate Cloth 2 % PADS 6 each  6 each Topical Daily Regalado, Belkys A, MD   6 each at 02/19/19 1001  . fentaNYL (SUBLIMAZE) injection 50-75 mcg  50-75 mcg Intravenous Q1H PRN Regalado, Belkys A, MD   75 mcg at 02/19/19 1626  . haloperidol (HALDOL) tablet 2 mg  2 mg Oral Q6H PRN Icard, Bradley L, DO   2 mg at 02/19/19 1734   Or  . haloperidol lactate (HALDOL) injection 1 mg  1 mg Intramuscular Q6H PRN Icard, Bradley L, DO   1 mg at 02/14/19 1345  . haloperidol lactate (HALDOL) injection 0.5 mg  0.5 mg Intravenous BID BM & HS PRN Migdalia Dkgan, Okor[place]wo U, MD   0.5 mg at 02/18/19 2223  . hydrocortisone sodium succinate (SOLU-CORTEF) 100 MG injection 25 mg  25 mg Intravenous Q12H Regalado, Belkys A, MD   25 mg at 02/19/19 1421  . insulin aspart (novoLOG) injection 0-9 Units  0-9 Units Subcutaneous Q4H Shah, Pratik D, DO   1 Units at 02/19/19 1252  . lidocaine (PF)  (XYLOCAINE) 1 % injection    PRN Brayton ElBruning, Kevin, PA-C   10 mL at 02/14/19 1207  . midodrine (PROAMATINE) tablet 10 mg  10 mg Oral TID WC Shah, Pratik D, DO   10 mg at 02/19/19 1734  . ondansetron (ZOFRAN) injection 4 mg  4 mg Intravenous Q6H PRN Pahwani, Rinka R, MD      . pantoprazole (PROTONIX) injection 40 mg  40 mg Intravenous Q12H Regalado, Belkys A, MD   40 mg at 02/19/19 1421  . piperacillin-tazobactam (ZOSYN) IVPB 3.375 g  3.375 g Intravenous Q8H Vicente SereneBarr, Grace E, RPH 12.5 mL/hr at 02/19/19 1733 3.375 g at 02/19/19 1733  . sodium chloride flush (NS) 0.9 % injection 10-40 mL  10-40 mL Intracatheter PRN Pahwani, Rinka R, MD      . sodium chloride flush (NS) 0.9 % injection 10-40 mL  10-40 mL Intracatheter Q12H Regalado, Belkys A, MD      . sodium chloride flush (NS) 0.9 % injection 10-40 mL  10-40 mL Intracatheter PRN Regalado, Belkys A, MD      . TPN ADULT (ION)   Intravenous Continuous TPN Daylene PoseyOriet, Jonathan, RPH 40 mL/hr at  02/18/19 1801    . TPN ADULT (ION)   Intravenous Continuous TPN Norva Pavlov, RPH 70 mL/hr at 02/19/19 1724      Musculoskeletal: Strength & Muscle Tone: decreased Gait & Station: did not witness Patient leans: N/A  Psychiatric Specialty Exam: Physical Exam  Nursing note and vitals reviewed. Constitutional: He is oriented to person, place, and time. He appears well-developed and well-nourished.  HENT:  Head: Normocephalic.  Neck: Normal range of motion.  Respiratory: Effort normal.  Musculoskeletal: Normal range of motion.  Neurological: He is alert and oriented to person, place, and time.  Psychiatric: His speech is normal and behavior is normal. Judgment and thought content normal. His affect is blunt. Cognition and memory are normal. He exhibits a depressed mood.    Review of Systems  Musculoskeletal: Positive for back pain.  Neurological: Positive for weakness.  Psychiatric/Behavioral: Positive for depression.  All other systems reviewed and are  negative.   Blood pressure 111/79, pulse 63, temperature 97.8 F (36.6 C), temperature source Oral, resp. rate 18, height  (1.981 m), weight 114.2 kg, SpO2 96 %.Body mass index is 29.09 kg/m.  General Appearance: Casual  Eye Contact:  Good  Speech:  Normal Rate  Volume:  Normal  Mood:  Anxious and Depressed  Affect:  Blunt  Thought Process:  Coherent  Orientation:  Full (Time, Place, and Person)  Thought Content:  WDL and Logical  Suicidal Thoughts:  No  Homicidal Thoughts:  No  Memory:  Immediate;   Good Recent;   Good Remote;   Good  Judgement:  Fair  Insight:  Fair  Psychomotor Activity:  Decreased  Concentration:  Concentration: Fair and Attention Span: Fair  Recall:  [employer] of Knowledge:  Fair  Language:  Good  Akathisia:  No  Handed:  Right  AIMS (if indicated):     Assets:  Leisure Time Resilience  ADL's:  Impaired  Cognition:  WNL  Sleep:      58 yo male admitted for medical concerns, consult placed for capacity.  Patient denies suicidal ideations on assessment.  No hallucinations, homicidal ideations, or substance abuse.  Patient is not aware of his severe physical limitations and his abilities.  He is confused at times.  He does not have capacity at this time to make independent medical decisions.  Treatment Plan Summary: Schizoaffective disorder: -Recommend Haldol 1 mg BID as needed for mood and agitation  EPS: -Recommend discontinue Cogentin as he is not taking Haldol regularly  Disposition: No evidence of imminent risk to self or others at present.    Nanine Means, NP 02/19/2019 5:56 PM

## 2019-02-19 NOTE — Progress Notes (Signed)
Daily Progress Note   Patient Name: [name]       Date: 02/19/2019 DOB: May 29, 1960  Age: 58 y.o. MRN#: [id number] Attending Physician: Alba Cory, MD Primary Care Physician: Fleet Contras, MD Admit Date: 02/08/2019  Reason for Consultation/Follow-up: Establishing goals of care and Psychosocial/spiritual support  Subjective: [name] greets me.  I ask if he has pain and he replies yes.  I asked where his pain was - head? Yes.  Chest? Yes.  Arms?  Yes.  Stomach? Yes.  [name] tells me he needs oxycodone.  He is currently on fentanyl now as he is in kidney failure.  Psychiatry was consulted previously to determine if the patient had capacity to make decisions regarding complex medical issues involved in his goals of care.  Unfortunately [name] faces life and death decisions.  Does he understand he has had a perforation in his GI tract and is unable to safely eat? Does he understand the severity of his cirrhosis?  Does he understand his kidneys are failing and if they continue to do so he will likely die as he is not a dialysis candidate?  Does he know he has had a perforated ulcer and now has multi-system organ failure.  If he understands these things can he make decisions regarding his code status and what the goal of his medical care should be in the future - maintain? Comfort?   Assessment: 58 yom  Contained GI perforation, advanced cirrhosis, acute on chronic kidney failure.  On TPN, NPO status.  Not a surgical candidate per surgery.  Poor prognosis.   Patient Profile/HPI:  Palliative Care consult requested for this 58 y.o. male with multiple medical problems including Child-Pugh class C cirrhosis, esophageal varices, HCV, splenomegaly, schizoaffective disorder, PTSD, history  of GI bleed, neuropathy, and CKD, who was admitted to the hospital 02/08/2019 with abdominal pain and rectal bleeding.  CT of abdomen and pelvis revealed pneumoperitoneum from bowel perforation.  Surgery was consulted but patient was not felt to be a surgical candidate due to the high risk from comorbidities.  He has been treated conservatively.  Palliative care was consulted to help address goals.     Length of Stay: 11  Current Medications: Scheduled Meds:  . benztropine mesylate  0.5 mg Intramuscular Daily  . Chlorhexidine Gluconate Cloth  6 each Topical Daily  . hydrocortisone sod succinate (SOLU-CORTEF) inj  25 mg Intravenous Q12H  . insulin aspart  0-9 Units Subcutaneous Q4H  . midodrine  10 mg Oral TID WC  . pantoprazole (PROTONIX) IV  40 mg Intravenous Q12H  . sodium chloride flush  10-40 mL Intracatheter Q12H    Continuous Infusions: . sodium chloride    . sodium chloride Stopped (02/12/19 1737)  . sodium chloride Stopped (02/19/19 1422)  . piperacillin-tazobactam (ZOSYN)  IV 3.375 g (02/19/19 1733)  . TPN ADULT (ION) Stopped (02/19/19 1737)    PRN Meds: Place/Maintain arterial line **AND** sodium chloride, sodium chloride, fentaNYL (SUBLIMAZE) injection, haloperidol **OR** haloperidol lactate, haloperidol lactate, lidocaine (PF), ondansetron (ZOFRAN) IV, sodium chloride flush, sodium chloride flush  Physical Exam        Well developed male with slight jaundice.  Lying in bed, NAD CV rrr resp no distress Abdomen distended but soft, + umbilical hernia  Vital Signs: BP 111/79 (BP Location: Left Arm)   Pulse 63   Temp 97.8 F (36.6 C) (Oral)   Resp 18   Ht 6\' 6"  (1.981 m)   Wt 114.2 kg   SpO2 96%   BMI 29.09 kg/m  SpO2: SpO2: 96 % O2 Device: O2 Device: Room Air O2 Flow Rate:    Intake/output summary:   Intake/Output Summary (Last 24 hours) at 02/19/2019 1942 Last data filed at 02/19/2019 1800 Gross per 24 hour  Intake 1404.24 ml  Output 200 ml  Net  1204.24 ml   LBM: Last BM Date: 02/18/19 Baseline Weight: Weight: 99.8 kg Most recent weight: Weight: 114.2 kg       Palliative Assessment/Data: 10%      Patient Active Problem List   Diagnosis Date Noted  . Personality disorder ([name])     Priority: High  . Ascites     Priority: Medium  . Kidney disease     Priority: Medium  . Palliative care by specialist   . AKI (acute kidney injury) ([place])   . Goals of care, counseling/discussion   . Septic shock ([name]) 02/09/2019  . Intra-abdominal free air of unknown etiology   . Central venous catheter in place   . Abdominal pain 02/08/2019  . Bowel perforation ([name]) 02/08/2019  . Bleeding per rectum   . Acute gastric ulcer   . Duodenal ulcer   . Acute esophagitis   . Acute blood loss anemia   . Hematemesis 01/08/2019  . Prolonged QT interval 01/08/2019  . Esophageal varices in cirrhosis ([name]) 01/08/2019  . CKD (chronic kidney disease) stage 3, GFR 30-59 ml/min 01/08/2019  . Suicidal ideation 01/08/2019  . Closed displaced fracture of neck of left fifth metacarpal bone 01/08/2019  . GI bleed 08/07/2018  . Acute abdominal pain   . Cirrhosis ([name])   . Generalized abdominal pain   . Hematemesis with nausea   . Evaluation by psychiatric service required   . Acute upper gastrointestinal bleeding 10/15/2017  . PTSD (post-traumatic stress disorder)   . Hypothyroidism   . History of adrenal insufficiency   . H/O diabetes insipidus   . Schizoaffective disorder ([name]) 11/08/2011  . Normocytic anemia   . Liver disease   . Hepatitis C   . GERD (gastroesophageal reflux disease)     Palliative Care Plan   Talked with Lorenz Coaster RN Care Manager with [facility] with Dr. Iran Planas office and ACT Team Jeananne Rama. Spoke with [employer] [facility] surgeon) who seems to know patient well.  Recommendations/Plan:  Patient's mother and brother are deceased.  He has no living family in the [place].  Does the patient have  capacity?  Psychiatry is currently following the patient.  Per Ms. Cendic there is a group of friends who know [name] and who supported his mother.  I am meeting with them at 11:00 on 11/24 to determine if  they have "an established relationship with the patient and can act in good faith on his behalf"  Goals of Care and Additional Recommendations:  Limitations on Scope of Treatment: Full Scope Treatment  Code Status:  Full code  Prognosis:  Likely less than 6 months given GI tract perforation, inability to eat,  advancing renal failure, and end stage cirrhosis.  Discharge Planning:  To Be Determined  Care plan was discussed with Dr. Sunnie Nielsenegalado and Dr. Neale BurlyFreeman  Thank you for allowing the Palliative Medicine Team to assist in the care of this patient.  Total time spent:  35 min     Greater than 50%  of this time was spent counseling and coordinating care related to the above assessment and plan.  Norvel RichardsMarianne Kadin Bera, PA-C Palliative Medicine  Please contact Palliative MedicineTeam phone at [phone] for questions and concerns between 7 am - 7 pm.   Please see AMION for individual provider pager numbers.

## 2019-02-19 NOTE — Progress Notes (Signed)
Nutrition Follow-up  DOCUMENTATION CODES:   Not applicable  INTERVENTION:   -TPN management per pharmacy  NUTRITION DIAGNOSIS:   Increased nutrient needs related to chronic illness(cirrhosis) as evidenced by estimated needs.  Ongoing  GOAL:   Patient will meet greater than or equal to 90% of their needs  Progressing   MONITOR:   Skin, Weight trends, Labs, I & O's, Other (Comment)(TPN)  REASON FOR ASSESSMENT:   Consult New TPN/TNA  ASSESSMENT:   58 y.o. male with medical history significant of cirrhosis with splenomegaly, esophageal varices, hepatitis C, adrenal insufficiency, hypothyroidism, schizoaffective disorder, PTSD, GI bleed, neuropathy, chronic kidney disease presented to emergency department due to worsening abdominal pain, distention associated with rectal bleeding for 1 day.  Work-up revealed bowel perforation/pneumoperitoneum.  11/13- s/p paracentesis (removed 2860 ml fluid) 11/17- PICC placed 11/18- s/p LLQ paracentesis (5 L clear yellow fluid removed), advanced to clear liquid diet, pt pulled PICC line 11/21- CT revealed large volume pneumoperitoneum with gas between liver and duodenum 11/22- TPN re-started, PICC placed  Reviewed I/O's: -225 ml x 24 hours and +9.3 L since admission  Per MD notes, pt with poor prognosis and surgical candidate. Plan for palliative care team meeting for goals of care tomorrow (02/20/19).   Pt lying in bed with blankets covering his head. He did not arouse to voice.   Pt currently receiving TPN at 40 ml/hr, which provides 1057 kcals and 58 grams of protein, meeting 43% of estimated kcal needs and 46% of estimated protein needs. Per pharmacy notes, plan to increase TPN to 70 ml/hr at 1800; regimen provides 1851 kcals and 101 grams of protein, meeting 76% of estimated kcal needs and 81% of estimated protein needs.   Labs reviewed: K: 3.3 (increase in TPN, Mg and Phos WDL, CBGS: 127-138 (inpatient orders for glycemic control  are 0-9 units insulin aspart every 4 hours).   Diet Order:   Diet Order            Diet NPO time specified Except for: Sips with Meds  Diet effective now              EDUCATION NEEDS:   Not appropriate for education at this time  Skin:  Skin Assessment: Reviewed RN Assessment  Last BM:  02/18/19 (via rectal tube)  Height:   Ht Readings from Last 1 Encounters:  02/09/19 6\' 6"  (1.981 m)    Weight:   Wt Readings from Last 1 Encounters:  02/19/19 114.2 kg    Ideal Body Weight:  97 kg  BMI:  Body mass index is 29.09 kg/m.  Estimated Nutritional Needs:   Kcal:  6160-7371  Protein:  125-145 grams  Fluid:  Per MD    Meriem Lemieux A. Jimmye Norman, RD, LDN, [place] Registered Dietitian II Certified Diabetes Care and Education Specialist Pager: [phone] After hours Pager: [phone]

## 2019-02-19 NOTE — Progress Notes (Signed)
PROGRESS NOTE    [name] Ems  ZOX:[id number] DOB: 08/30/1960 DOA: 02/08/2019 PCP: Fleet ContrasAvbuere, Edwin, MD   Brief Narrative: 58 year old with past medical history significant for cirrhosis with splenomegaly, esophageal varices, hepatitis C, adrenal insufficiency, hypothyroidism, schizoaffective disorder, PTSD, GI bleed, neuropathy, chronic kidney disease presented to the emergency department due to worsening abdominal pain, distention associated with rectal bleeding for 1 day.  Work-up revealed bowel perforation pneumoperitoneum.  Surgery consulted and recommended no operative intervention due to high risk of mortality, antibiotics and bowel rest.  GI was consulted for possible paracentesis due to significant ascites as well as dark maroon stool and acute blood loss anemia.  Protonix infusion was initiated as well as octreotide tried as patient has history of esophageal varices.  Patient developed low blood pressure.  CCM was consulted.  Patient was transferred back to triad service on 02/12/2019.  Patient underwent UGI on 11/16 and he was found to have focal contained perforation in the distal stomach.  General surgery and GI recommended keep NPO.  No surgical intervention due to poor surgical candidate.   Assessment & Plan:   Principal Problem:   Bowel perforation (HCC) Active Problems:   Schizoaffective disorder (HCC)   PTSD (post-traumatic stress disorder)   History of adrenal insufficiency   GI bleed   Cirrhosis (HCC)   Esophageal varices in cirrhosis (HCC)   CKD (chronic kidney disease) stage 3, GFR 30-59 ml/min   Abdominal pain   Bleeding per rectum   Septic shock (HCC)   Central venous catheter in place   Goals of care, counseling/discussion   Palliative care by specialist   AKI (acute kidney injury) (HCC)  1-Septic Shock due to bowel perforation/pneumoperitoneum/gastric perforation: Evaluated by [facility] and deemed not to be a surgical candidate given high mortality. Patient  endoscopy with no esophageal varices. Patient was on vasopressors at one point during his stay in the ICU. He has received a couple of dose of albumin. He is currently on midodrine.  On IV Zosyn. GI series showed gastric perforation with contained pneumoperitoneum. Patient has been off TPN, surgery to decide in resumption.  Patient was a started on clear diet.  Palliative consulted again for goals of care, recommended psych consult for capacity evaluation.  He is still complaining of abdominal pain. He had 5 L remove two days ago. He is sleepy just received fentanyl.  Completed protonix gtt. On  IV Protonix BID.  WBC was at  30, CT abdomen showed large volume of free intraluminal fluid and pneumoperitoneum. Bowel perforation near pylorus or duodenum.  Surgery is recommending Bowel rest, TPN.  On TPN.  Continue with medical management.   2-Hystory of adrenal insufficiency: He is on 50 mg of prednisone daily out patient.  On hydrocortisone low dose.   3-Cirrhosis with ascites: Meld score 23 , Child Pugh C; History of esophageal varices, HCV (treated with Epclusa in 2018, [facility] GI following, appreciate assistance Underwent paracentesis on 11/18 with WBC more than  2000. Body fluid no growth to date.    Anemia with GI bleed: Due to known gastric and duodenal ulcer had EGD Oct 2020 which showed grade D esophagitis, 2cm hiatal hernia, 5cm non-bleeding gastric ulcer and non-bleeding duodenal ulcers). Continue with PPI  Acute renal failure in the setting of chronic kidney disease a stage III: Creatinine baseline 1.7--2.0 Renal ultrasound showed chronic kidney disease Nephrology has signed off Worsening renal function. Nephrology consulted.   Hyponatremia: resolved with D5 . Stop IV  fluids.   Hypokalemia; replete IV again today   History of PTSD and schizoaffective disorder chronic pain On fentanyl Patient expressed some suicidal ideation,  psychiatric was consulted.  He was cleared by psych. On as needed Haldol  I added  cogentin, he is getting stiff. Await psych rec Palliative recommend Psych evaluation for capacity.   Metabolic acidosis;  Improved with bicarb gtt.  Monitor.   Nutrition Problem: Increased nutrient needs Etiology: chronic illness(cirrhosis)    Signs/Symptoms: estimated needs    Interventions: TPN  Estimated body mass index is 29.09 kg/m as calculated from the following:   Height as of this encounter: 6\' 6"  (1.981 m).   Weight as of this encounter: 114.2 kg.   DVT prophylaxis: SCD Code Status: Full code Family Communication: I spoke with Daleen Snook, she relates she has  help him with translation. He has a group of his mothers friend that helps him providing food and help. She will come and visit [place]. She is willing to speak with Palliative Disposition Plan:  Consultants:  General surgery GI  Procedures:   US paracentesis 02/14/2019  Antimicrobials:  Zosyn   Subjective: He is complaining of abdominal pain. 10/10, asking for pain medications.    Objective: Vitals:   02/18/19 0355 02/18/19 1946 02/19/19 0417 02/19/19 0500  BP: 120/70 122/64 135/78   Pulse: 67 75 87   Resp: 18 16 17    Temp: (!) 97.4 F (36.3 C) (!) 97.4 F (36.3 C) 98.2 F (36.8 C)   TempSrc: Oral Oral Oral   SpO2: 97% 97% 96%   Weight:    114.2 kg  Height:        Intake/Output Summary (Last 24 hours) at 02/19/2019 1412 Last data filed at 02/18/2019 2200 Gross per 24 hour  Intake -  Output 200 ml  Net -200 ml   Filed Weights   02/11/19 0501 02/12/19 0136 02/19/19 0500  Weight: 109.9 kg 109.6 kg 114.2 kg    Examination:  General exam; NAD, Chronic ill appearing Respiratory system: Crackles bases Cardiovascular system: S 1, S 2 RRR Gastrointestinal system: BS present, soft, nt Central nervous system: alert Extremities:  No edema Skin: No rashes.     Data Reviewed: I have personally reviewed  following labs and imaging studies  CBC: Recent Labs  Lab 02/13/19 0552 02/14/19 0619 02/15/19 0319 02/16/19 1141 02/17/19 0304 02/18/19 0324 02/19/19 0556  WBC 3.5* 5.0 10.0 31.2* 30.9* 31.7* 29.1*  NEUTROABS 2.9 4.3  --   --   --   --  25.9*  HGB 8.0* 8.3* 8.7* 10.5* 10.0* 10.2* 11.0*  HCT 24.5* 25.7* 26.7* 32.4* 30.2* 31.5* 33.9*  MCV 92.8 92.1 91.4 91.0 90.4 91.0 91.1  PLT 74* 76* 103* 198 194 243 458   Basic Metabolic Panel: Recent Labs  Lab 02/14/19 0619 02/15/19 0319 02/16/19 1141 02/17/19 0800 02/18/19 0324 02/19/19 0556  NA 149* 136 135 133* 134* 135  K 3.3* 3.0* 3.2* 3.9 3.5 3.3*  CL 114* 106 104 103 103 102  CO2 19* 17* 16* 18* 19* 20*  GLUCOSE 78 94 105* 121* 94 115*  BUN 55* 47* 46* 46* 48* 52*  CREATININE 2.59* 2.33* 2.57* 2.71* 2.89* 3.04*  CALCIUM 9.8 9.4 9.5 9.6 9.7 9.8  MG 2.2  --   --   --   --  2.3  PHOS 2.6  --   --   --   --  4.5   GFR: Estimated Creatinine Clearance: 37.7 mL/min (A) (by C-G formula  based on SCr of 3.04 mg/dL (H)). Liver Function Tests: Recent Labs  Lab 02/14/19 0619 02/18/19 0324 02/19/19 0556  AST 16 17 16   ALT 9 13 12   ALKPHOS 36* 63 67  BILITOT 2.2* 2.3* 1.8*  PROT 5.2* 6.0* 6.3*  ALBUMIN 2.9* 2.5* 2.6*   No results for input(s): LIPASE, AMYLASE in the last 168 hours. No results for input(s): AMMONIA in the last 168 hours. Coagulation Profile: Recent Labs  Lab 02/15/19 0319  INR 1.4*   Cardiac Enzymes: No results for input(s): CKTOTAL, CKMB, CKMBINDEX, TROPONINI in the last 168 hours. BNP (last 3 results) No results for input(s): PROBNP in the last 8760 hours. HbA1C: No results for input(s): HGBA1C in the last 72 hours. CBG: Recent Labs  Lab 02/18/19 1942 02/19/19 0017 02/19/19 0419 02/19/19 0817 02/19/19 1230  GLUCAP 105* 138* 127* 130* 127*   Lipid Profile: Recent Labs    02/19/19 0556  TRIG 100   Thyroid Function Tests: No results for input(s): TSH, T4TOTAL, FREET4, T3FREE, THYROIDAB in  the last 72 hours. Anemia Panel: No results for input(s): VITAMINB12, FOLATE, FERRITIN, TIBC, IRON, RETICCTPCT in the last 72 hours. Sepsis Labs: No results for input(s): PROCALCITON, LATICACIDVEN in the last 168 hours.  Recent Results (from the past 240 hour(s))  MRSA PCR Screening     Status: None   Collection Time: 02/09/19  3:30 PM   Specimen: Nasopharyngeal  Result Value Ref Range Status   MRSA by PCR NEGATIVE NEGATIVE Final    Comment:        The GeneXpert MRSA Assay (FDA approved for NASAL specimens only), is one component of a comprehensive MRSA colonization surveillance program. It is not intended to diagnose MRSA infection nor to guide or monitor treatment for MRSA infections. Performed at [facility], [address], [place], [address] [place]   Body fluid culture (includes gram stain)     Status: None   Collection Time: 02/09/19  5:30 PM   Specimen: Pleural Fluid  Result Value Ref Range Status   Specimen Description FLUID  Final   Special Requests NONE  Final   Gram Stain   Final    FEW WBC PRESENT, PREDOMINANTLY PMN NO ORGANISMS SEEN    Culture   Final    NO GROWTH 3 DAYS Performed at [facility], [address] [place], [address] [place]    Report Status 02/13/2019 FINAL  Final  Fungus Culture With Stain     Status: None (Preliminary result)   Collection Time: 02/09/19  5:30 PM   Specimen: Peritoneal Cavity; Pleural Fluid  Result Value Ref Range Status   Fungus Stain Final report  Final    Comment: (NOTE) Performed At: [facility] [address] [place], [address] [place] [id number]    Fungus (Mycology) Culture PENDING  Incomplete   Fungal Source FLUID  Final    Comment: Performed at [facility], [address], [place], [address] [place]  Fungus Culture Result     Status: None   Collection Time: 02/09/19  5:30 PM  Result Value Ref Range Status   Result 1 Comment  Final    Comment: (NOTE)  KOH/Calcofluor preparation:  no fungus observed. Performed At: [facility] [address] [place], [address] [place] [id number]          Radiology Studies: Jol[facility] Schimke [facility]  Result Date: 02/18/2019 If [facility] image not attached, placement could not be  confirmed due to current cardiac rhythm.       Scheduled Meds: . benztropine mesylate  0.5 mg Intramuscular Daily  . Chlorhexidine Gluconate Cloth  6 each Topical Daily  . hydrocortisone sod succinate (SOLU-CORTEF) inj  25 mg Intravenous Q12H  . insulin aspart  0-9 Units Subcutaneous Q4H  . midodrine  10 mg Oral TID WC  . pantoprazole (PROTONIX) IV  40 mg Intravenous Q12H  . sodium chloride flush  10-40 mL Intracatheter Q12H   Continuous Infusions: . sodium chloride    . sodium chloride Stopped (02/12/19 1737)  . sodium chloride 1,000 mL (02/15/19 0811)  . piperacillin-tazobactam (ZOSYN)  IV 3.375 g (02/19/19 1002)  . TPN ADULT (ION) 40 mL/hr at 02/18/19 1801  . TPN ADULT (ION)       LOS: 11 days    Time spent: 35 minutes.     Alba Cory, MD [facility]   If 7PM-7AM, please contact night-coverage [email or web address] Password [email or web address] 02/19/2019, 2:12 PM

## 2019-02-19 NOTE — TOC Initial Note (Signed)
Transition of Care [facility]) - Initial/Assessment Note    Patient Details  Name: [name] MRN: [id number] Date of Birth: 1960/08/20  Transition of Care [facility]) CM/SW Contact:    Doy Hutching, LCSWA Phone Number: 02/19/2019, 12:42 PM  Clinical Narrative:                 CSW aware pt is recommended for SNF however has several medical issues that require someone to be able to assist pt with decision making. Pt only oriented to self at this time. CSW received phone call from MD requesting assistance with finding family members/friends to support pt. CSW called and left HIPAA compliant message for Jeanice Lim at [phone], however this is listed as pt number and there was no answer. CSW also called and left message for Gita Kudo (pts [facility] RN). Final called was placed to [phone], a gentleman named Saban answered, without sharing any health information CSW confirmed that Katy Apo knows pt, he confirms that pt parents have both passed and that he does not have any other family. Per Katy Apo, pt friend Emi Holes knows him well and he will try and find a way to get in touch with her. Saban took CSW number and will f/u with CSW once he gets in touch with Emi Holes or have her call this Clinical research associate.   Of note CSW also found a request from [facility] APS in pt chart for ROI. CSW reached out to Sylvan Cheese the DSS worker listed to see if she possibly has additional contacts for pt [phone]). Will update MD and RNCM with this information.   Expected Discharge Plan: Skilled Nursing Facility Barriers to Discharge: Family Issues, Continued Medical Work up   Patient Goals and CMS Choice  Pt unable to participate in decision/goal making at this time.       Expected Discharge Plan and Services Expected Discharge Plan: Skilled Nursing Facility In-house Referral: Clinical Social Work Discharge Planning Services: CM Consult Living arrangements for the past 2 months: Apartment  Prior  Living Arrangements/Services Living arrangements for the past 2 months: Apartment Lives with:: Self Patient language and need for interpreter reviewed:: Yes        Need for Family Participation in Patient Care: Yes (Comment)(decision making; support) Care giver support system in place?: Yes (comment)(per friend pt family has passed, he only has support from friends) Current home services: DME, Other (comment)(CCN) Criminal Activity/Legal Involvement Pertinent to Current Situation/Hospitalization: No - Comment as needed   Permission Sought/Granted Permission sought to share information with : Family Supports Permission granted to share information with : No(pt unable to provide thorough details for contacts)  Share Information with NAME: Emi Holes Cencic, Gita Kudo, Saban  Permission granted to share info w AGENCY: CCN  Permission granted to share info w Relationship: friends; Nurse, children's     Emotional Assessment Appearance:: Appears stated age Attitude/Demeanor/Rapport: Unable to Assess Affect (typically observed): Unable to Assess Orientation: : Oriented to Self Alcohol / Substance Use: Alcohol Use Psych Involvement: Yes (comment), Outpatient Provider  Admission diagnosis:  Generalized abdominal pain [R10.84] GI bleed [K92.2] Intra-abdominal free air of unknown etiology [K66.8] Septic shock (HCC) [A41.9, R65.21] Patient Active Problem List   Diagnosis Date Noted  . Palliative care by specialist   . AKI (acute kidney injury) (HCC)   . Goals of care, counseling/discussion   . Septic shock (HCC) 02/09/2019  . Intra-abdominal free air of unknown etiology   . Central venous catheter in place   . Abdominal pain  02/08/2019  . Bowel perforation ([place]) 02/08/2019  . Bleeding per rectum   . Acute gastric ulcer   . Duodenal ulcer   . Acute esophagitis   . Acute blood loss anemia   . Hematemesis 01/08/2019  . Prolonged QT interval 01/08/2019  . Esophageal varices in cirrhosis  ([place]) 01/08/2019  . CKD (chronic kidney disease) stage 3, GFR 30-59 ml/min 01/08/2019  . Suicidal ideation 01/08/2019  . Closed displaced fracture of neck of left fifth metacarpal bone 01/08/2019  . GI bleed 08/07/2018  . Acute abdominal pain   . Cirrhosis ([place])   . Generalized abdominal pain   . Hematemesis with nausea   . Evaluation by psychiatric service required   . Acute upper gastrointestinal bleeding 10/15/2017  . PTSD (post-traumatic stress disorder)   . Hypothyroidism   . History of adrenal insufficiency   . H/O diabetes insipidus   . Schizoaffective disorder ([place]) 11/08/2011  . Personality disorder ([place])   . Normocytic anemia   . Ascites   . Liver disease   . Hepatitis C   . Kidney disease   . GERD (gastroesophageal reflux disease)    PCP:  Nolene Ebbs, MD Pharmacy:   [id number] - , [address] [place] [address] Phone: [phone] Fax: [phone]     Social Determinants of Health (SDOH) Interventions    Readmission Risk Interventions Readmission Risk Prevention Plan 01/11/2019  Transportation Screening Complete  Medication Review ([place]) Complete  PCP or Specialist appointment within 3-5 days of discharge Complete  HRI or [place] Complete  SW Recovery Care/Counseling Consult Complete  [place] Not Applicable  Some recent data might be hidden

## 2019-02-19 NOTE — Progress Notes (Signed)
[facility] Progress Note    Assessment/ Plan:   1. AKI on CKD 3b - baseline creat 1.7- 2.0.  Renal US some ckd, UA negative, UNa < 10. Initial AKI thought to be due to HRS and cr stabilized out after albumin/ octreotide/ midodrine.   He is still continuing midodrine.  I think now the uptick in his Cr is reflective of multisystem organ failure.  UA appears bland.  His prognosis is dim.  TPN has been ordered, Cr may improve with nutrition but overall a poor picture.  He is not a dialysis candidate   2. Perforated viscous - by CT scan on admit, likely perforated ulcer.  CT scan is much worse from 11/21 with fluid and air intraperitoneally.  On Zosyn and PPI.  82% intra-op mortality risk per notes. He is on bowel rest. Palliative care is following    3. Cirrhosis: Hep C and ascites  4. Schizoaffective d/o  5. Hx adrenal insuff - on steroids per primary team   6. Dispo: appreciate palliative care involvement.    Subjective:    Confused per nursing and has only been able to tell her his name and birthday - he had limited communication with me on exam.  Palliative care has been following.  From [place].   Review of systems:  Denies shortness of breath  He is Npo  No n/v   Objective:   BP 111/79 (BP Location: Left Arm)   Pulse 63   Temp 97.8 F (36.6 C) (Oral)   Resp 18   Ht 6\' 6"  (1.981 m)   Wt 114.2 kg   SpO2 96%   BMI 29.09 kg/m   Intake/Output Summary (Last 24 hours) at 02/19/2019 1701 Last data filed at 02/18/2019 2200 Gross per 24 hour  Intake -  Output 200 ml  Net -200 ml   Weight change:   Physical Exam:   Gen: adult male in bed in NAD at rest CVS: RRR Resp: clear anteriorly and unlabored  Abd: ++ distended abdomen and somewhat tender Ext: no LE edema Neuro - answers some questions; oriented to person and knows birthday , not current year "it's the year my mother died"  Imaging: 02/20/2019 Ekg Site Rite  Result Date: 02/18/2019 If 02/20/2019  not attached, placement could not be confirmed due to current cardiac rhythm.   Labs: BMET Recent Labs  Lab 02/13/19 0552 02/14/19 0619 02/15/19 0319 02/16/19 1141 02/17/19 0800 02/18/19 0324 02/19/19 0556  NA 146* 149* 136 135 133* 134* 135  K 3.4* 3.3* 3.0* 3.2* 3.9 3.5 3.3*  CL 112* 114* 106 104 103 103 102  CO2 19* 19* 17* 16* 18* 19* 20*  GLUCOSE 97 78 94 105* 121* 94 115*  BUN 55* 55* 47* 46* 46* 48* 52*  CREATININE 2.59* 2.59* 2.33* 2.57* 2.71* 2.89* 3.04*  CALCIUM 9.3 9.8 9.4 9.5 9.6 9.7 9.8  PHOS  --  2.6  --   --   --   --  4.5   CBC Recent Labs  Lab 02/13/19 0552 02/14/19 0619  02/16/19 1141 02/17/19 0304 02/18/19 0324 02/19/19 0556  WBC 3.5* 5.0   < > 31.2* 30.9* 31.7* 29.1*  NEUTROABS 2.9 4.3  --   --   --   --  25.9*  HGB 8.0* 8.3*   < > 10.5* 10.0* 10.2* 11.0*  HCT 24.5* 25.7*   < > 32.4* 30.2* 31.5* 33.9*  MCV 92.8 92.1   < > 91.0 90.4 91.0  91.1  PLT 74* 76*   < > 198 194 243 277   < > = values in this interval not displayed.    Medications:    . benztropine mesylate  0.5 mg Intramuscular Daily  . Chlorhexidine Gluconate Cloth  6 each Topical Daily  . hydrocortisone sod succinate (SOLU-CORTEF) inj  25 mg Intravenous Q12H  . insulin aspart  0-9 Units Subcutaneous Q4H  . midodrine  10 mg Oral TID WC  . pantoprazole (PROTONIX) IV  40 mg Intravenous Q12H  . sodium chloride flush  10-40 mL Intracatheter Q12H   Claudia Desanctis  02/19/2019, 5:01 PM

## 2019-02-20 ENCOUNTER — Inpatient Hospital Stay (HOSPITAL_COMMUNITY): Payer: Medicaid Other

## 2019-02-20 DIAGNOSIS — N170 Acute kidney failure with tubular necrosis: Secondary | ICD-10-CM

## 2019-02-20 DIAGNOSIS — N1832 Chronic kidney disease, stage 3b: Secondary | ICD-10-CM

## 2019-02-20 DIAGNOSIS — F250 Schizoaffective disorder, bipolar type: Secondary | ICD-10-CM

## 2019-02-20 LAB — BASIC METABOLIC PANEL
Anion gap: 13 (ref 5–15)
BUN: 55 mg/dL — ABNORMAL HIGH (ref 6–20)
CO2: 16 mmol/L — ABNORMAL LOW (ref 22–32)
Calcium: 9.8 mg/dL (ref 8.9–10.3)
Chloride: 106 mmol/L (ref 98–111)
Creatinine, Ser: 3.12 mg/dL — ABNORMAL HIGH (ref 0.61–1.24)
GFR calc Af Amer: 24 mL/min — ABNORMAL LOW (ref >60)
GFR calc non Af Amer: 21 mL/min — ABNORMAL LOW (ref >60)
Glucose, Bld: 149 mg/dL — ABNORMAL HIGH (ref 70–99)
Potassium: 3.7 mmol/L (ref 3.5–5.1)
Sodium: 135 mmol/L (ref 135–145)

## 2019-02-20 LAB — CBC
HCT: 33.9 % — ABNORMAL LOW (ref 39.0–52.0)
Hemoglobin: 10.9 g/dL — ABNORMAL LOW (ref 13.0–17.0)
MCH: 30 pg (ref 26.0–34.0)
MCHC: 32.2 g/dL (ref 30.0–36.0)
MCV: 93.4 fL (ref 80.0–100.0)
Platelets: 257 10*3/uL (ref 150–400)
RBC: 3.63 MIL/uL — ABNORMAL LOW (ref 4.22–5.81)
RDW: 19.8 % — ABNORMAL HIGH (ref 11.5–15.5)
WBC: 32.2 10*3/uL — ABNORMAL HIGH (ref 4.0–10.5)
nRBC: 0 % (ref 0.0–0.2)

## 2019-02-20 LAB — GLUCOSE, CAPILLARY
Glucose-Capillary: 103 mg/dL — ABNORMAL HIGH (ref 70–99)
Glucose-Capillary: 120 mg/dL — ABNORMAL HIGH (ref 70–99)
Glucose-Capillary: 125 mg/dL — ABNORMAL HIGH (ref 70–99)
Glucose-Capillary: 138 mg/dL — ABNORMAL HIGH (ref 70–99)
Glucose-Capillary: 144 mg/dL — ABNORMAL HIGH (ref 70–99)
Glucose-Capillary: 146 mg/dL — ABNORMAL HIGH (ref 70–99)

## 2019-02-20 MED ORDER — ALTEPLASE 2 MG IJ SOLR
2.0000 mg | Freq: Once | INTRAMUSCULAR | Status: AC
  Administered 2019-02-20: 2 mg
  Filled 2019-02-20: qty 2

## 2019-02-20 MED ORDER — ALTEPLASE 2 MG IJ SOLR
2.0000 mg | Freq: Once | INTRAMUSCULAR | Status: AC
  Administered 2019-02-20: 23:00:00 2 mg
  Filled 2019-02-20: qty 2

## 2019-02-20 MED ORDER — TROLAMINE SALICYLATE 10 % EX CREA
TOPICAL_CREAM | Freq: Two times a day (BID) | CUTANEOUS | Status: DC
  Filled 2019-02-20: qty 85

## 2019-02-20 MED ORDER — HALOPERIDOL 1 MG PO TABS
2.0000 mg | ORAL_TABLET | Freq: Two times a day (BID) | ORAL | Status: DC
  Administered 2019-02-21 – 2019-02-22 (×3): 2 mg via ORAL
  Filled 2019-02-20 (×5): qty 2

## 2019-02-20 MED ORDER — SODIUM CHLORIDE 0.9% FLUSH
10.0000 mL | INTRAVENOUS | Status: DC | PRN
  Administered 2019-03-02 – 2019-03-07 (×2): 10 mL
  Filled 2019-02-20 (×2): qty 40

## 2019-02-20 MED ORDER — TRACE MINERALS CU-MN-SE-ZN 300-55-60-3000 MCG/ML IV SOLN
INTRAVENOUS | Status: AC
  Administered 2019-02-20: 18:00:00 via INTRAVENOUS
  Filled 2019-02-20: qty 884

## 2019-02-20 MED ORDER — MUSCLE RUB 10-15 % EX CREA
TOPICAL_CREAM | Freq: Two times a day (BID) | CUTANEOUS | Status: DC
  Administered 2019-02-20 – 2019-02-27 (×12): via TOPICAL
  Administered 2019-02-28 – 2019-03-01 (×2): 1 via TOPICAL
  Administered 2019-03-01 – 2019-03-05 (×7): via TOPICAL
  Administered 2019-03-05: 1 via TOPICAL
  Administered 2019-03-05 – 2019-03-12 (×12): via TOPICAL
  Administered 2019-03-26 (×2): 1 via TOPICAL
  Filled 2019-02-20: qty 85

## 2019-02-20 NOTE — Progress Notes (Signed)
Physical Therapy Treatment Patient Details Name: [name] MRN: [id number] DOB: 1960-04-02 Today's Date: 02/20/2019    History of Present Illness Pt is a 58 y/o male admitted secondary to abdominal pain, distention and rectal bleeding. Pt found to have a bowel perforation. Per surgical team, no surgery indicated as pt is a "poor surgical candidate". Paracentesis completed on 11/18 with 5L removed. PMH including but not limited to cirrhosis, hep C, schizoaffective disorder, PTSD and CKD.    PT Comments    Patient seen for mobility progression. This session focused on functional transfer training. Pt unable to stand using Stedy standing frame and Clarise Cruz plus lift. Pt following single step cues inconsistently. RN notified that maxi move hoyer lift will need to be used for OOB transfers. Continue to progress as tolerated with anticipated d/c to SNF for further skilled PT services.      Follow Up Recommendations  SNF;Supervision/Assistance - 24 hour     Equipment Recommendations  Hospital bed    Recommendations for Other Services       Precautions / Restrictions Precautions Precautions: Fall Restrictions Weight Bearing Restrictions: No    Mobility  Bed Mobility Overal bed mobility: Needs Assistance Bed Mobility: Supine to Sit;Sit to Supine     Supine to sit: Max assist;+2 for physical assistance Sit to supine: Max assist;+2 for physical assistance   General bed mobility comments: max A to initiate LEs to EOB with max truncal assist to pull up to sitting. Pt kept reaching for therapist arms and stating "help me"  Transfers Overall transfer level: Needs assistance Equipment used: Ambulation equipment used Transfers: Sit to/from Stand Sit to Stand: Max assist;+2 physical assistance         General transfer comment: max A +2 to rise from bed x3 barely able to clear sacrum. Attempts with stedy and sara plus without success for OOB, mostly due to command following and safety  concerns  Ambulation/Gait             General Gait Details: unable   Stairs             Wheelchair Mobility    Modified Rankin (Stroke Patients Only)       Balance Overall balance assessment: Needs assistance Sitting-balance support: Feet supported;Single extremity supported Sitting balance-Leahy Scale: Fair     Standing balance support: Bilateral upper extremity supported Standing balance-Leahy Scale: Poor Standing balance comment: physical assist to rise and bil UE support                            Cognition Arousal/Alertness: Awake/alert Behavior During Therapy: Flat affect;Restless Overall Cognitive Status: Difficult to assess Area of Impairment: Orientation;Memory;Following commands;Safety/judgement                 Orientation Level: Disoriented to;Situation;Time;Place   Memory: Decreased short-term memory Following Commands: Follows one step commands inconsistently Safety/Judgement: Decreased awareness of safety;Decreased awareness of deficits   Problem Solving: Decreased initiation;Difficulty sequencing;Requires verbal cues;Requires tactile cues General Comments: pt perseverates on few topics, repeatedly stating "help me up" and calling out for "tonya". Decreased engagement and command following      Exercises      General Comments        Pertinent Vitals/Pain Pain Assessment: Faces Faces Pain Scale: Hurts little more Pain Location: abdomen, generalized? Pain Descriptors / Indicators: Grimacing;Moaning;Guarding Pain Intervention(s): Limited activity within patient's tolerance;Monitored during session;Repositioned    Home Living  Prior Function            PT Goals (current goals can now be found in the care plan section) Acute Rehab PT Goals Patient Stated Goal: get up out of bed Progress towards PT goals: Progressing toward goals    Frequency    Min 2X/week      PT Plan  Current plan remains appropriate    Co-evaluation PT/OT/SLP Co-Evaluation/Treatment: Yes Reason for Co-Treatment: Necessary to address cognition/behavior during functional activity;For patient/therapist safety;To address functional/ADL transfers PT goals addressed during session: Mobility/safety with mobility OT goals addressed during session: ADL's and self-care      AM-PAC PT "6 Clicks" Mobility   Outcome Measure  Help needed turning from your back to your side while in a flat bed without using bedrails?: Total Help needed moving from lying on your back to sitting on the side of a flat bed without using bedrails?: Total Help needed moving to and from a bed to a chair (including a wheelchair)?: Total Help needed standing up from a chair using your arms (e.g., wheelchair or bedside chair)?: Total Help needed to walk in hospital room?: Total Help needed climbing 3-5 steps with a railing? : Total 6 Click Score: 6    End of Session Equipment Utilized During Treatment: Gait belt Activity Tolerance: Patient tolerated treatment well Patient left: in bed;with call bell/phone within reach;with bed alarm set Nurse Communication: Mobility status;Need for lift equipment PT Visit Diagnosis: Other abnormalities of gait and mobility (R26.89) Pain - part of body: (abdomen)     Time: 9191-6606 PT Time Calculation (min) (ACUTE ONLY): 44 min  Charges:  $Therapeutic Activity: 23-37 mins                     Erline Levine, PTA Acute Rehabilitation Services Pager: [phone] Office: [phone]     Carolynne Edouard 02/20/2019, 5:38 PM

## 2019-02-20 NOTE — Progress Notes (Signed)
[facility] Progress Note    Assessment/ Plan:   1. AKI on CKD 3b - baseline creat 1.7- 2.0.  Renal US some ckd, UA negative, UNa < 10. Initial AKI thought to be due to HRS and cr stabilized out after albumin/ octreotide/ midodrine.   He is still continuing midodrine.  Rising creatinine is reflective of multisystem organ failure.  UA appears bland.  His prognosis is unfortunately poor.  TPN has been ordered, Cr may improve with nutrition but overall a poor picture.  He is not a dialysis candidate.  Supportive measures per primary team.  Check post-void and would place foley if retains over 250 mL   2. Perforated viscous - by CT scan on admit, likely perforated ulcer.  CT scan is much worse from 11/21 with fluid and air intraperitoneally.  On Zosyn and PPI.  82% intra-op mortality risk per notes. He is on bowel rest. Palliative care is following    3. Cirrhosis: Hep C and ascites  4. Schizoaffective d/o  5. Hx adrenal insuff - on steroids per primary team   6. Dispo: appreciate palliative care involvement.  Spoke with primary team and renal will sign off for now.  Please do not hesitate to contact me with any questions   Subjective:    Team has recommended palliative measures and palliative has had a meeting with friends - they are discussing goals.  Urine output charted as 1 void.   Review of systems:  Denies shortness of breath  He is Npo  No n/v   Objective:   BP 124/78 (BP Location: Left Arm)   Pulse 79   Temp 98.2 F (36.8 C) (Axillary)   Resp 19   Ht 6\' 6"  (1.981 m)   Wt 114.2 kg   SpO2 97%   BMI 29.09 kg/m   Intake/Output Summary (Last 24 hours) at 02/20/2019 1351 Last data filed at 02/20/2019 0841 Gross per 24 hour  Intake 998.85 ml  Output 50 ml  Net 948.85 ml   Weight change:   Physical Exam:     Gen: adult male in bed in NAD at rest  CVS: RRR Resp: clear anteriorly and unlabored  Abd: ++ distended abdomen and somewhat tender Ext: no LE  edema Neuro - oriented to person location not to year "2024"  Imaging: Dg Chest Port 1 View  Result Date: 02/20/2019 CLINICAL DATA:  PICC line placement. EXAM: PORTABLE CHEST 1 VIEW COMPARISON:  02/09/2019. FINDINGS: Interval removal of right IJ line. Interval placement of right PICC line, its tip is over the upper portion of the SVC. Stable cardiomegaly. Significant improvement in left base aeration. Persistent bibasilar atelectasis/infiltrates. Small left pleural effusion. No pneumothorax. IMPRESSION: 1. Interim removal of right IJ line. Interval placement of right PICC line, its tip is over the upper portion of the SVC. 2.  Stable cardiomegaly. 3. Significant improvement in left base aeration. Persistent bibasilar atelectasis/infiltrates. Small left pleural effusion. Electronically Signed   By: Marcello Moores  Register   On: 02/20/2019 08:58    Labs: BMET Recent Labs  Lab 02/14/19 4270 02/15/19 0319 02/16/19 1141 02/17/19 0800 02/18/19 0324 02/19/19 0556 02/20/19 0807  NA 149* 136 135 133* 134* 135 135  K 3.3* 3.0* 3.2* 3.9 3.5 3.3* 3.7  CL 114* 106 104 103 103 102 106  CO2 19* 17* 16* 18* 19* 20* 16*  GLUCOSE 78 94 105* 121* 94 115* 149*  BUN 55* 47* 46* 46* 48* 52* 55*  CREATININE 2.59* 2.33* 2.57* 2.71*  2.89* 3.04* 3.12*  CALCIUM 9.8 9.4 9.5 9.6 9.7 9.8 9.8  PHOS 2.6  --   --   --   --  4.5  --    CBC Recent Labs  Lab 02/14/19 0619  02/17/19 0304 02/18/19 0324 02/19/19 0556 02/20/19 0807  WBC 5.0   < > 30.9* 31.7* 29.1* 32.2*  NEUTROABS 4.3  --   --   --  25.9*  --   HGB 8.3*   < > 10.0* 10.2* 11.0* 10.9*  HCT 25.7*   < > 30.2* 31.5* 33.9* 33.9*  MCV 92.1   < > 90.4 91.0 91.1 93.4  PLT 76*   < > 194 243 277 257   < > = values in this interval not displayed.    Medications:    . benztropine mesylate  0.5 mg Intramuscular Daily  . Chlorhexidine Gluconate Cloth  6 each Topical Daily  . haloperidol  2 mg Oral BID  . hydrocortisone sod succinate (SOLU-CORTEF) inj  25 mg  Intravenous Q12H  . insulin aspart  0-9 Units Subcutaneous Q4H  . midodrine  10 mg Oral TID WC  . pantoprazole (PROTONIX) IV  40 mg Intravenous Q12H  . sodium chloride flush  10-40 mL Intracatheter Q12H  . trolamine salicylate   Topical BID   [name]  02/20/2019, 1:51 PM

## 2019-02-20 NOTE — Progress Notes (Signed)
Daily Progress Note   Patient Name: [name]       Date: 02/20/2019 DOB: 05/24/1960  Age: 58 y.o. MRN#: [id number] Attending Physician: Elmarie Shiley, MD Primary Care Physician: Nolene Ebbs, MD Admit Date: 02/08/2019  Reason for Consultation/Follow-up: Establishing goals of care and Psychosocial/spiritual support  Subjective: Met with patient at bedside along with two family friends Daleen Snook and Mordecai Rasmussen).  These ladies know Acey because they helped is mother - Daleen Snook was her translator and Mordecai Rasmussen was her driver.   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  Daleen Snook explains that Korde is currently asking for his mother, complaining of pain in his hands and saying that he is hungry.  She feels his mental illness is acting up and explains that when the mental illness cycle starts Jamorion speaks over people, becomes more needy and demanding, and complains that everything hurts.  Daleen Snook states that Tilman can be very good when he is on his medications.  He has a PhD is sociology and is normally a very good conversationalist (in [place]), but when he becomes ill he becomes afraid to be alone and paranoid.  He as a Therapist, occupational of nurses and doctors.  Further 10 years ago [place] had pneumonia and VDRF.  His doctors told his mother he would not survive and requested that he be taken off life support.  Ever since [place] has wanted full code / full resuscitation.  Daleen Snook has heard him say this many times.  Arye is originally from [place].  He has no family left alive other than a nephew from whom he is estranged.  Per Daleen Snook his brother was taken prisoner in the [place] war and was executed.  His DNA was found and he was reported dead to [place] and his mother during this time of year.  Last  year about this time Taggert's mother died.  She was very protective of Keidrick as he has been dealing with mental illness for a long time.  We discussed Rolan's GI perforation necessitating TPN and causing great infection.  We discussed his chronic liver failure and his worsening renal failure, and we talked about his mental health.  I reviewed a MOST form.  I provided a "Hard Choices Booklet".  Initially [place] declined to assist in making any  decisions for [place] (goals of care).  I explained that if they do not the decisions will be left to people who do not know him personally.  The medical recommendation is for comfort and [facility].  The alternative would be to continue aggressive medical treatment which would likely result in his being discharged to a nursing facility at some point and him dying in some number of months regardless of the type of medical care he receives.  Daleen Snook and Deena considered the situation.  They decided that as part of a group - they would be willing to make decisions.  Daleen Snook and Mordecai Rasmussen named two other individuals that they would like to be part of the group - [employer] and a gentleman Richarda Overlie?).  They would like to discuss the issues with them and group together again after the holiday to make [place] decisions for [place].  I believe they are hopeful that by that time [place] may be able to contribute to the decisions.   Assessment: Patient afraid, calling out.  Describes pain and hunger.   Patient Profile/HPI:  Palliative Care consult requested for this 58 y.o. 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  CT of abdomen and pelvis revealed pneumoperitoneum from bowel perforation.  Surgery was consulted but patient was not felt to be a  surgical candidate due to the high risk from comorbidities.  He has been treated conservatively.  Palliative care was consulted to help address goals.  Length of Stay: 12  Current Medications: Scheduled Meds:  . benztropine mesylate  0.5 mg Intramuscular Daily  . Chlorhexidine Gluconate Cloth  6 each Topical Daily  . haloperidol  2 mg Oral BID  . hydrocortisone sod succinate (SOLU-CORTEF) inj  25 mg Intravenous Q12H  . insulin aspart  0-9 Units Subcutaneous Q4H  . midodrine  10 mg Oral TID WC  . pantoprazole (PROTONIX) IV  40 mg Intravenous Q12H  . sodium chloride flush  10-40 mL Intracatheter Q12H    Continuous Infusions: . sodium chloride    . sodium chloride Stopped (02/12/19 1737)  . sodium chloride Stopped (02/19/19 1422)  . piperacillin-tazobactam (ZOSYN)  IV 3.375 g (02/20/19 0836)  . TPN ADULT (ION) Stopped (02/20/19 0323)  . TPN ADULT (ION)      PRN Meds: Place/Maintain arterial line **AND** sodium chloride, sodium chloride, fentaNYL (SUBLIMAZE) injection, haloperidol lactate, lidocaine (PF), ondansetron (ZOFRAN) IV, sodium chloride flush, sodium chloride flush, sodium chloride flush  Physical Exam        Well developed chronically ill appearing male, awake, crying out, holding his hands out to me. CV rrr no m/r/g Resp CTA no w/c/r  Pectus carinatum Abdomen swollen and tight.   Vital Signs: BP 124/78 (BP Location: Left Arm)   Pulse 79   Temp 98.2 F (36.8 C) (Axillary)   Resp 19   Ht '6\' 6"'$  (1.981 m)   Wt 114.2 kg   SpO2 97%   BMI 29.09 kg/m  SpO2: SpO2: 97 % O2 Device: O2 Device: Room Air O2 Flow Rate:    Intake/output summary:   Intake/Output Summary (Last 24 hours) at 02/20/2019 1219 Last data filed at 02/20/2019 0841 Gross per 24 hour  Intake 998.85 ml  Output 0 ml  Net 998.85 ml   LBM: Last BM Date: 02/18/19 Baseline Weight: Weight: 99.8 kg Most recent  weight: Weight: 114.2 kg       Palliative Assessment/Data: 20%      Patient Active  Problem List   Diagnosis Date Noted  . Personality disorder ([place])     Priority: High  . Ascites     Priority: Medium  . Kidney disease     Priority: Medium  . Palliative care encounter   . Palliative care by specialist   . AKI (acute kidney injury) ([place])   . Goals of care, counseling/discussion   . Septic shock ([place]) 02/09/2019  . Intra-abdominal free air of unknown etiology   . Central venous catheter in place   . Abdominal pain 02/08/2019  . Bowel perforation ([place]) 02/08/2019  . Bleeding per rectum   . Acute gastric ulcer   . Duodenal ulcer   . Acute esophagitis   . Acute blood loss anemia   . Hematemesis 01/08/2019  . Prolonged QT interval 01/08/2019  . 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 CKD (chronic kidney disease) stage 3, GFR 30-59 ml/min 01/08/2019  . Suicidal ideation 01/08/2019  . Closed displaced fracture of neck of left fifth metacarpal bone 01/08/2019  . GI bleed 08/07/2018  . Acute abdominal pain   . Cirrhosis ([place])   . Generalized abdominal pain   . Hematemesis with nausea   . Evaluation by psychiatric service required   . Acute upper gastrointestinal bleeding 10/15/2017  . PTSD (post-traumatic stress disorder)   . Hypothyroidism   . History of adrenal insufficiency   . H/O diabetes insipidus   . Schizoaffective disorder ([place]) 11/08/2011  . Normocytic anemia   . Liver disease   . Hepatitis C   . GERD (gastroesophageal reflux disease)     Palliative Care Plan    Recommendations/Plan:  Continue current care  Daleen Snook and Mordecai Rasmussen with speak with their other two friends to determine if they as a group and make medical decisions for [place].  Neither Daleen Snook or Mordecai Rasmussen would want a code Blue called however they felt they needed to discuss this with their other two friends before an order was placed to that effect.  Will request Chaplain support (he is Muslim)  Would request feeding assistance - He has a tray of clear liquids in the room.   Will request aspercream for his hands.  Schedule haldol 2 mg PO bid as it was at home prior to admission.  PMT will follow up with Daleen Snook and Mordecai Rasmussen - but they wanted to wait until after the holiday weekend.  Goals of Care and Additional Recommendations:  Limitations on Scope of Treatment: Full Scope Treatment  Code Status:  Full code  Prognosis:   Unable to determine - even with full support he likely only has months.  Discharge Planning:  To Be Determined  Care plan was discussed with Dr. Tyrell Antonio, [place]  Thank you for allowing the Palliative Medicine Team to assist in the care of this patient.  Total time spent:  90 min. Time in 11:00 Time out 12:30     Greater than 50%  of this time was spent counseling and coordinating care related to the above assessment and plan.  [name], PA-C Palliative Medicine  Please contact Palliative MedicineTeam phone at [phone] for questions and concerns between 7 am - 7 pm.   Please see AMION for individual provider pager numbers.

## 2019-02-20 NOTE — Progress Notes (Signed)
This nurse noted patients PICC not flushing well. IVT consulted, 1-2 cm IV catheter came out. CXR was recommended. This nurse reached out to Dr requesting CXR.

## 2019-02-20 NOTE — Progress Notes (Signed)
PHARMACY - ADULT TOTAL PARENTERAL NUTRITION CONSULT NOTE   Pharmacy Consult for TPN Indication: Stomach perforation on bowel rest  Patient Measurements: Height: 6\' 6"  (198.1 cm) Weight: 251 lb 12.3 oz (114.2 kg) IBW/kg (Calculated) : 91.4 TPN AdjBW (KG): 107.4 Body mass index is 29.09 kg/m. Usual Weight: ~100kg  Assessment: 58 y/o [place] male  presenting 11/14 with abdominal pain/distention and rectal bleeding, found with bowel perforation tx with medical management and bowel rest.  Paracentesis 11/13. Started liquids on 11/18-19 now back on bowel rest and pharmacy consulted to start TPN.  Little PO intake since admission and prealbumin <5.    GI: wrosened CT 11/21 pneumoperitoneum - gas between liver/duodenum. Perforation of distal stomach, GIB. Poor surgical candidate. Umbilical hernia note-stable. Rectal tube with LBM 11/22. Prealbumin <5. - IV PPI q 12 hrs  Endo: A1c 3.9: CBGs WNL - also on Solucortef for adrenal insuff. (Prednisone PTA) Insulin requirements in the past 24 hours:  3 units last 24h  - on sens SSI Lytes: K 3.7 improved, lower bicarb 16, Mg/Phos on high end of normal. Renal: CKD3> SCr 3.04>>3.12 (BL 1.7-2), UOP. None recorded Pulm: RA Cards: VSS - midodrine TID Hepatobil: Hx cirrhosis Child Pugh C/ascites. Paracentesis.  Tbili 2.3>1.8 down, AST/ALT wnl, TG wnl 100 Neuro: Schizoaffective disorder, chronic pain on Bentropine IM daily, Intermittently agitated. ID: Zosyn for peritonitis 11/13>>  . Afebrile. WBC 29.1>>32.2 up. Adjust Zosyn dose as renal function worsens.   TPN Access: PICC 11/22 TPN start date: 11/22 Nutritional Goals (per RD recommendation on 11/23): KCal: 4098-1191 Protein: 125-145g Fluid: per MD Goal TPN rate is 85 ml/hr  Current Nutrition:  NPO  Plan:  Concentrate and increase TPN to 33ml/hr This TPN provides 132 g of protein, 387 g of dextrose, and 61 g of lipids which provides 2460 kCals per day (100%) Electrolytes in TPN: Max acetate.  no Mg or Phos or Ca Add MVI, trace elements Continue sensitive SSI and adjust as needed TPN labs MonThurs and prn Palliative care meeting 11/24.   Berneda Piccininni S. Alford Highland, PharmD, [facility] Clinical Staff Pharmacist [phone] 02/20/2019 9:13 AM

## 2019-02-20 NOTE — Progress Notes (Signed)
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 - CT 11/21 w/ large volume pneumoperitoneum with gas between liver and duodenum -Based on most recent labs, he is a Childs C cirrhotic. This placed the patient at aperi-operative mortality of82%for any abdominal surgery. Cont bowel rest and abx. - palliative care following and there is a meeting with friends today at 11:00. - we will follow peripherally   ID -Zosyn11/12 >> VTE -SCDs FEN -IVF,NPO, TNA Foley -none Follow up -TBD   LOS: 12 days    Subjective: CC: wants out of bed  No abdominal pain per patient. He is insisting that he wants to get up.   Objective: Vital signs in last 24 hours: Temp:  [97.8 F (36.6 C)-98.2 F (36.8 C)] 98.2 F (36.8 C) (11/24 0426) Pulse Rate:  [63-79] 79 (11/24 0426) Resp:  [18-19] 19 (11/24 0426) BP: (111-124)/(78-80) 124/78 (11/24 0426) SpO2:  [96 %-98 %] 97 % (11/24 0426) Last BM Date: 02/18/19  Intake/Output from previous day: 11/23 0701 - 11/24 0700 In: 998.9 [I.V.:736.5; IV Piggyback:262.4] Out: 0  Intake/Output this shift: No intake/output data recorded.  PE:  Gen:  Alert, NAD, pleasant, ill appearing Pulm:  Rate and effort normal Abd: Soft,distended, fluid wave and tympanic, no TTP per pt but he has guarding,Umbilical hernia present.Hypoactive bowel sounds Skin: no rashes noted, warm and dry  Anti-infectives: Anti-infectives (From admission, onward)   Start      Dose/Rate Route Frequency Ordered Stop   02/08/19 2200  piperacillin-tazobactam (ZOSYN) IVPB 3.375 g     3.375 g 12.5 mL/hr over 240 Minutes Intravenous Every 8 hours 02/08/19 1552     02/08/19 1545  piperacillin-tazobactam (ZOSYN) IVPB 3.375 g     3.375 g 100 mL/hr over 30 Minutes Intravenous  Once 02/08/19 1533 02/08/19 1840      Lab Results:  Recent Labs    02/18/19 0324 02/19/19 0556  WBC 31.7* 29.1*  HGB 10.2* 11.0*  HCT 31.5* 33.9*  PLT 243 277   BMET Recent Labs    02/18/19 0324 02/19/19 0556  NA 134* 135  K 3.5 3.3*  CL 103 102  CO2 19* 20*  GLUCOSE 94 115*  BUN 48* 52*  CREATININE 2.89* 3.04*  CALCIUM 9.7 9.8   PT/INR No results for input(s): LABPROT, INR in the last 72 hours. CMP     Component Value Date/Time   NA 135 02/19/2019 0556   K 3.3 (L) 02/19/2019 0556   CL 102 02/19/2019 0556   CO2 20 (L) 02/19/2019 0556   GLUCOSE 115 (H) 02/19/2019 0556   BUN 52 (H) 02/19/2019 0556   CREATININE 3.04 (H) 02/19/2019 0556   CALCIUM 9.8 02/19/2019 0556   CALCIUM 8.5 09/30/2010 0437   PROT 6.3 (L) 02/19/2019 0556   PROT 5.4 (L) 11/02/2017 1147   ALBUMIN 2.6 (L) 02/19/2019 0556   AST 16 02/19/2019 0556   ALT 12 02/19/2019 0556   ALKPHOS 67 02/19/2019 0556   BILITOT 1.8 (H) 02/19/2019 0556   GFRNONAA 22 (L) 02/19/2019 0556   GFRAA 25 (L) 02/19/2019 4580  Lipase     Component Value Date/Time   LIPASE 26 12/14/2018 0125    Studies/Results: [place] Ekg Site Rite  Result Date: 02/18/2019 If Site Rite image not attached, placement could not be confirmed due to current cardiac rhythm.    Kalman Drape, PA-C [facility] Please see amion for pager for the following: Myna H[place], W, & Friday 7:00am - 4:30pm Thursdays 7:00am -11:30am

## 2019-02-20 NOTE — Progress Notes (Signed)
PROGRESS NOTE    [name]  QMV:[id number] DOB: 05/03/1960 DOA: 02/08/2019 PCP: Fleet ContrasAvbuere, Edwin, MD   Brief Narrative: 58 year old with past medical history significant for cirrhosis with splenomegaly, esophageal varices, hepatitis C, adrenal insufficiency, hypothyroidism, schizoaffective disorder, PTSD, GI bleed, neuropathy, chronic kidney disease presented to the emergency department due to worsening abdominal pain, distention associated with rectal bleeding for 1 day.  Work-up revealed bowel perforation pneumoperitoneum.  Surgery consulted and recommended no operative intervention due to high risk of mortality, antibiotics and bowel rest.  GI was consulted for possible paracentesis due to significant ascites as well as dark maroon stool and acute blood loss anemia.  Protonix infusion was initiated as well as octreotide tried as patient has history of esophageal varices.  Patient developed low blood pressure.  CCM was consulted.  Patient was transferred back to triad service on 02/12/2019.  Patient underwent UGI on 11/16 and he was found to have focal contained perforation in the distal stomach.  General surgery and GI recommended keep NPO.  No surgical intervention due to poor surgical candidate.  Patient was treated with supportive care, subsequently he was a started on full liquid diet.  Patient abdominal pain got worse, repeated CT abdomen showed large volume of free intraluminal fluid and pneumoperitoneum. Bowel perforation near pylorus or duodenum.  Patient white count got worse.  He was a still getting IV antibiotics and IV Protonix.  His kidney function continued to get worse.  Beside aggressive care patient has not improved. Patient does not have POA or family here in the [place].  Palliative care meeting for goals of care with close friends.  Patient was evaluated by psych he does not have capacity to make his own medical decisions.   Assessment & Plan:   Principal Problem:   Bowel  perforation (HCC) Active Problems:   Schizoaffective disorder (HCC)   PTSD (post-traumatic stress disorder)   History of adrenal insufficiency   GI bleed   Cirrhosis (HCC)   Esophageal varices in cirrhosis (HCC)   CKD (chronic kidney disease) stage 3, GFR 30-59 ml/min   Abdominal pain   Bleeding per rectum   Septic shock (HCC)   Central venous catheter in place   Goals of care, counseling/discussion   Palliative care by specialist   AKI (acute kidney injury) (HCC)   Palliative care encounter  1-Septic Shock due to bowel perforation/pneumoperitoneum/gastric perforation: -Evaluated by [facility] and deemed not to be a surgical candidate given high mortality. -Patient endoscopy with no esophageal varices. -Patient was on vasopressors at one point during his stay in the ICU. -He has received a couple of dose of albumin. -He is currently on midodrine.  On IV Zosyn. -GI series showed gastric perforation with contained pneumoperitoneum. -Palliative consulted again for goals of care, recommended psych consult for capacity evaluation.  -He is still complaining of abdominal pain. He had paracentesis yielding 5 L.  -Completed protonix gtt. On  IV Protonix BID.  -WBC -increase to  30, CT abdomen 11-21 showed large volume of free intraluminal fluid and pneumoperitoneum. Bowel perforation near pylorus or duodenum.  Surgery is recommending Bowel rest, TPN.  On TPN.  Continue with medical management.  Palliative care meeting today with family friends.   2-Hystory of adrenal insufficiency: He is on 50 mg of prednisone daily out patient.  On hydrocortisone low dose.   3-Cirrhosis with ascites: Meld score 23 , Child Pugh C; History of esophageal varices, HCV (treated with Epclusa in 2018, followed by liver clinic at [facility]). GI following, appreciate assistance Underwent paracentesis on 11/18 with WBC more than  2000. Body fluid no growth to date.    Anemia with GI bleed: Due to known gastric  and duodenal ulcer had EGD Oct 2020 which showed grade D esophagitis, 2cm hiatal hernia, 5cm non-bleeding gastric ulcer and non-bleeding duodenal ulcers). Continue with PPI  Acute renal failure in the setting of chronic kidney disease a stage III: Creatinine baseline 1.7--2.0 Renal ultrasound showed chronic kidney disease Nephrology has signed off Worsening renal function. Nephrology consulted and following.  On TPN  Hyponatremia: resolved with D5 . Stop IV fluids.   Hypokalemia; resolved  History of PTSD and schizoaffective disorder chronic pain On fentanyl Patient expressed some suicidal ideation, psychiatric was consulted.  He was cleared by psych. Continue with haldol and cogentin.  Evaluated by Psych, patient does not have capacity to make medical decision.    Metabolic acidosis;  Improved with bicarb gtt.  Monitor.   Nutrition Problem: Increased nutrient needs Etiology: chronic illness(cirrhosis)    Signs/Symptoms: estimated needs    Interventions: TPN  Estimated body mass index is 29.09 kg/m as calculated from the following:   Height as of this encounter: 6\' 6"  (1.981 m).   Weight as of this encounter: 114.2 kg.   DVT prophylaxis: SCD Code Status: Full code Family Communication: I spoke with [place], 02-20-2019. Disposition Plan: palliative care meeting 11-24. Patient is poor prognosis. I recommend comfort care and DNR.  Consultants:  General surgery GI  Procedures:   US paracentesis 02/14/2019  Antimicrobials:  Zosyn   Subjective: He is sleepy this morning. Denies pain   Objective: Vitals:   02/19/19 0500 02/19/19 1547 02/19/19 2009 02/20/19 0426  BP:  111/79 118/80 124/78  Pulse:  63 68 79  Resp:  18 19 19   Temp:  97.8 F (36.6 C) 98 F (36.7 C) 98.2 F (36.8 C)  TempSrc:  Oral Axillary Axillary  SpO2:  96% 98% 97%  Weight: 114.2 kg     Height:        Intake/Output Summary (Last 24 hours) at 02/20/2019 1118 Last data filed at  02/20/2019 0841 Gross per 24 hour  Intake 998.85 ml  Output 0 ml  Net 998.85 ml   Filed Weights   02/11/19 0501 02/12/19 0136 02/19/19 0500  Weight: 109.9 kg 109.6 kg 114.2 kg    Examination:  General exam; NAD, chronic ill appearing.  Respiratory system: Crackles bases Cardiovascular system: S 1, S 2 RRR Gastrointestinal system: BS present, soft, very distended Central nervous system: sleepy Extremities: edema LE Skin: No rashes     Data Reviewed: I have personally reviewed following labs and imaging studies  CBC: Recent Labs  Lab 02/14/19 0619  02/16/19 1141 02/17/19 0304 02/18/19 0324 02/19/19 0556 02/20/19 0807  WBC 5.0   < > 31.2* 30.9* 31.7* 29.1* 32.2*  NEUTROABS 4.3  --   --   --   --  25.9*  --   HGB 8.3*   < > 10.5* 10.0* 10.2* 11.0* 10.9*  HCT 25.7*   < > 32.4* 30.2* 31.5* 33.9* 33.9*  MCV 92.1   < > 91.0 90.4 91.0 91.1 93.4  PLT 76*   < > 198 194 243 277 257   < > = values in this interval not displayed.   Basic Metabolic Panel: Recent Labs  Lab 02/14/19 0619  02/16/19 1141 02/17/19 0800 02/18/19 0324 02/19/19 0556 02/20/19 0807  NA 149*   < > 135 133* 134* 135  135  K 3.3*   < > 3.2* 3.9 3.5 3.3* 3.7  CL 114*   < > 104 103 103 102 106  CO2 19*   < > 16* 18* 19* 20* 16*  GLUCOSE 78   < > 105* 121* 94 115* 149*  BUN 55*   < > 46* 46* 48* 52* 55*  CREATININE 2.59*   < > 2.57* 2.71* 2.89* 3.04* 3.12*  CALCIUM 9.8   < > 9.5 9.6 9.7 9.8 9.8  MG 2.2  --   --   --   --  2.3  --   PHOS 2.6  --   --   --   --  4.5  --    < > = values in this interval not displayed.   GFR: Estimated Creatinine Clearance: 36.7 mL/min (A) (by C-G formula based on SCr of 3.12 mg/dL (H)). Liver Function Tests: Recent Labs  Lab 02/14/19 0619 02/18/19 0324 02/19/19 0556  AST 16 17 16   ALT 9 13 12   ALKPHOS 36* 63 67  BILITOT 2.2* 2.3* 1.8*  PROT 5.2* 6.0* 6.3*  ALBUMIN 2.9* 2.5* 2.6*   No results for input(s): LIPASE, AMYLASE in the last 168 hours. No results  for input(s): AMMONIA in the last 168 hours. Coagulation Profile: Recent Labs  Lab 02/15/19 0319  INR 1.4*   Cardiac Enzymes: No results for input(s): CKTOTAL, CKMB, CKMBINDEX, TROPONINI in the last 168 hours. BNP (last 3 results) No results for input(s): PROBNP in the last 8760 hours. HbA1C: No results for input(s): HGBA1C in the last 72 hours. CBG: Recent Labs  Lab 02/19/19 1619 02/19/19 2007 02/20/19 0027 02/20/19 0422 02/20/19 0814  GLUCAP 117* 107* 120* 103* 138*   Lipid Profile: Recent Labs    02/19/19 0556  TRIG 100   Thyroid Function Tests: No results for input(s): TSH, T4TOTAL, FREET4, T3FREE, THYROIDAB in the last 72 hours. Anemia Panel: No results for input(s): VITAMINB12, FOLATE, FERRITIN, TIBC, IRON, RETICCTPCT in the last 72 hours. Sepsis Labs: No results for input(s): PROCALCITON, LATICACIDVEN in the last 168 hours.  No results found for this or any previous visit (from the past 240 hour(s)).       Radiology Studies: Dg Chest Port 1 View  Result Date: 02/20/2019 CLINICAL DATA:  PICC line placement. EXAM: PORTABLE CHEST 1 VIEW COMPARISON:  02/09/2019. FINDINGS: Interval removal of right IJ line. Interval placement of right PICC line, its tip is over the upper portion of the SVC. Stable cardiomegaly. Significant improvement in left base aeration. Persistent bibasilar atelectasis/infiltrates. Small left pleural effusion. No pneumothorax. IMPRESSION: 1. Interim removal of right IJ line. Interval placement of right PICC line, its tip is over the upper portion of the SVC. 2.  Stable cardiomegaly. 3. Significant improvement in left base aeration. Persistent bibasilar atelectasis/infiltrates. Small left pleural effusion. Electronically Signed   By: 02/22/2019  Register   On: 02/20/2019 08:58        Scheduled Meds: . benztropine mesylate  0.5 mg Intramuscular Daily  . Chlorhexidine Gluconate Cloth  6 each Topical Daily  . hydrocortisone sod succinate  (SOLU-CORTEF) inj  25 mg Intravenous Q12H  . insulin aspart  0-9 Units Subcutaneous Q4H  . midodrine  10 mg Oral TID WC  . pantoprazole (PROTONIX) IV  40 mg Intravenous Q12H  . sodium chloride flush  10-40 mL Intracatheter Q12H   Continuous Infusions: . sodium chloride    . sodium chloride Stopped (02/12/19 1737)  . sodium chloride Stopped (02/19/19 1422)  .  piperacillin-tazobactam (ZOSYN)  IV 3.375 g (02/20/19 0836)  . TPN ADULT (ION) Stopped (02/20/19 0323)  . TPN ADULT (ION)       LOS: 12 days    Time spent: 35 minutes.     Elmarie Shiley, MD [facility]   If 7PM-7AM, please contact night-coverage [email or web address] Password [email or web address] 02/20/2019, 11:18 AM

## 2019-02-20 NOTE — Progress Notes (Signed)
Visited [name] per Spiritual Consult. I read passages from the Qur'an to him. He would calm down while I was reading aloud, but when I would stop he would continue the repetitive asking for help. I will continue to provide spiritual care to him.  Rev. [place].

## 2019-02-20 NOTE — Progress Notes (Signed)
Occupational Therapy Treatment Patient Details Name: [name] MRN: [id number] DOB: Aug 15, 1960 Today's Date: 02/20/2019    History of present illness Pt is a 58 y/o male admitted secondary to abdominal pain, distention and rectal bleeding. Pt found to have a bowel perforation. Per surgical team, no surgery indicated as pt is a "poor surgical candidate". Paracentesis completed on 11/18 with 5L removed. PMH including but not limited to cirrhosis, hep C, schizoaffective disorder, PTSD and CKD.   OT comments  Pt making slow progress. Seen today for BADL mobility progression. Pt completed bed mobility with max A +2. Attempted sit <> stand with max A +2 and pt not able to clear hips. Stedy and sara plus also tried without success. Pt with decreased command following and safety awareness limiting transfer and OOB activity. He was perseverating on saying "help me up" and repeated this constantly throughout session without successful redirection. Minimal command following this date without max multimodal cueing to adhere to one step commands. SNF remains appropriate. Will continue to follow.   Follow Up Recommendations  SNF;Supervision/Assistance - 24 hour    Equipment Recommendations  None recommended by OT    Recommendations for Other Services      Precautions / Restrictions Precautions Precautions: Fall Restrictions Weight Bearing Restrictions: No       Mobility Bed Mobility Overal bed mobility: Needs Assistance Bed Mobility: Supine to Sit;Sit to Supine     Supine to sit: Max assist;+2 for physical assistance Sit to supine: Max assist;+2 for physical assistance   General bed mobility comments: max A to initiate LEs to EOB with max truncal assist to pull up to sitting. Pt kept reaching for therapist arms and stating "help me"  Transfers Overall transfer level: Needs assistance Equipment used: Ambulation equipment used Transfers: Sit to/from Stand Sit to Stand: Max assist;+2  physical assistance         General transfer comment: max A +2 to rise from bed x3 barely able to clear sacrum. Attempts with stedy and sara plus without success for OOB, mostly due to command following and safety concerns    Balance Overall balance assessment: Needs assistance Sitting-balance support: Feet supported;Single extremity supported Sitting balance-Leahy Scale: Fair     Standing balance support: Bilateral upper extremity supported Standing balance-Leahy Scale: Poor Standing balance comment: physical assist to rise and bil UE support                           ADL either performed or assessed with clinical judgement   ADL Overall ADL's : Needs assistance/impaired                                     Functional mobility during ADLs: Maximal assistance;+2 for physical assistance;+2 for safety/equipment(bed mobility) General ADL Comments: total A for BADL at this time     Vision Patient Visual Report: No change from baseline     Perception     Praxis      Cognition Arousal/Alertness: Awake/alert Behavior During Therapy: Flat affect;Restless Overall Cognitive Status: Difficult to assess Area of Impairment: Orientation;Memory;Following commands;Safety/judgement                 Orientation Level: Disoriented to;Situation;Time;Place   Memory: Decreased short-term memory Following Commands: Follows one step commands inconsistently Safety/Judgement: Decreased awareness of safety;Decreased awareness of deficits   Problem Solving: Decreased initiation;Difficulty sequencing;Requires verbal cues;Requires tactile  cues General Comments: pt perseverates on few topics, repeatedly stating "help me up" and calling out for "tonya". Decreased engagement and command following        Exercises     Shoulder Instructions       General Comments      Pertinent Vitals/ Pain       Pain Assessment: Faces Faces Pain Scale: Hurts little  more Pain Location: abdomen, generalized? Pain Descriptors / Indicators: Grimacing;Moaning;Guarding Pain Intervention(s): Monitored during session;Repositioned;Limited activity within patient's tolerance  Home Living                                          Prior Functioning/Environment              Frequency  Min 2X/week        Progress Toward Goals  OT Goals(current goals can now be found in the care plan section)  Progress towards OT goals: Not progressing toward goals - comment(decreased engagement)  Acute Rehab OT Goals Patient Stated Goal: get up out of bed OT Goal Formulation: With patient Time For Goal Achievement: 03/01/19 Potential to Achieve Goals: Fair  Plan Discharge plan remains appropriate;Frequency remains appropriate    Co-evaluation    PT/OT/SLP Co-Evaluation/Treatment: Yes Reason for Co-Treatment: Necessary to address cognition/behavior during functional activity;For patient/therapist safety;To address functional/ADL transfers PT goals addressed during session: Mobility/safety with mobility;Proper use of DME OT goals addressed during session: ADL's and self-care      AM-PAC OT "6 Clicks" Daily Activity     Outcome Measure   Help from another person eating meals?: Total(TPN) Help from another person taking care of personal grooming?: A Lot Help from another person toileting, which includes using toliet, bedpan, or urinal?: Total Help from another person bathing (including washing, rinsing, drying)?: Total Help from another person to put on and taking off regular upper body clothing?: Total Help from another person to put on and taking off regular lower body clothing?: Total 6 Click Score: 7    End of Session Equipment Utilized During Treatment: Gait belt;Rolling walker  OT Visit Diagnosis: Muscle weakness (generalized) (M62.81);Pain;Other symptoms and signs involving cognitive function;Unsteadiness on feet (R26.81) Pain -  part of body: (generalized?)   Activity Tolerance Patient tolerated treatment well   Patient Left in bed;with call bell/phone within reach;with bed alarm set   Nurse Communication Mobility status        Time: [id number] OT Time Calculation (min): 44 min  Charges: OT General Charges $OT Visit: 1 Visit OT Treatments $Self Care/Home Management : 8-22 mins  Dalphine Handing, MSOT, OTR/L Behavioral Health OT/ Acute Relief OT [facility] Office: [phone]  Dalphine Handing 02/20/2019, 5:17 PM

## 2019-02-20 NOTE — Progress Notes (Signed)
Both ports of PICC line occluded. TPN had to be taken down since occluded. Pharmacy made aware and stated to call MD for D10 order. TPA instilled in both ports. RN paging MD on call for D10 infusion order. Will continue to monitor.

## 2019-02-21 ENCOUNTER — Inpatient Hospital Stay (HOSPITAL_COMMUNITY): Payer: Medicaid Other

## 2019-02-21 ENCOUNTER — Telehealth (HOSPITAL_COMMUNITY): Payer: Self-pay | Admitting: *Deleted

## 2019-02-21 LAB — COMPREHENSIVE METABOLIC PANEL
ALT: 15 U/L (ref 0–44)
AST: 18 U/L (ref 15–41)
Albumin: 2.5 g/dL — ABNORMAL LOW (ref 3.5–5.0)
Alkaline Phosphatase: 64 U/L (ref 38–126)
Anion gap: 13 (ref 5–15)
BUN: 58 mg/dL — ABNORMAL HIGH (ref 6–20)
CO2: 20 mmol/L — ABNORMAL LOW (ref 22–32)
Calcium: 10.3 mg/dL (ref 8.9–10.3)
Chloride: 106 mmol/L (ref 98–111)
Creatinine, Ser: 3.09 mg/dL — ABNORMAL HIGH (ref 0.61–1.24)
GFR calc Af Amer: 24 mL/min — ABNORMAL LOW (ref >60)
GFR calc non Af Amer: 21 mL/min — ABNORMAL LOW (ref >60)
Glucose, Bld: 113 mg/dL — ABNORMAL HIGH (ref 70–99)
Potassium: 3.4 mmol/L — ABNORMAL LOW (ref 3.5–5.1)
Sodium: 139 mmol/L (ref 135–145)
Total Bilirubin: 2 mg/dL — ABNORMAL HIGH (ref 0.3–1.2)
Total Protein: 6.3 g/dL — ABNORMAL LOW (ref 6.5–8.1)

## 2019-02-21 LAB — GLUCOSE, CAPILLARY
Glucose-Capillary: 103 mg/dL — ABNORMAL HIGH (ref 70–99)
Glucose-Capillary: 105 mg/dL — ABNORMAL HIGH (ref 70–99)
Glucose-Capillary: 105 mg/dL — ABNORMAL HIGH (ref 70–99)
Glucose-Capillary: 111 mg/dL — ABNORMAL HIGH (ref 70–99)
Glucose-Capillary: 115 mg/dL — ABNORMAL HIGH (ref 70–99)
Glucose-Capillary: 119 mg/dL — ABNORMAL HIGH (ref 70–99)

## 2019-02-21 LAB — PHOSPHORUS: Phosphorus: 3.9 mg/dL (ref 2.5–4.6)

## 2019-02-21 LAB — MAGNESIUM: Magnesium: 2.3 mg/dL (ref 1.7–2.4)

## 2019-02-21 MED ORDER — LIDOCAINE HCL 1 % IJ SOLN
INTRAMUSCULAR | Status: AC
  Filled 2019-02-21: qty 20

## 2019-02-21 MED ORDER — LIDOCAINE HCL 1 % IJ SOLN
INTRAMUSCULAR | Status: DC | PRN
  Administered 2019-02-21: 2 mL

## 2019-02-21 MED ORDER — ALTEPLASE 2 MG IJ SOLR
2.0000 mg | Freq: Once | INTRAMUSCULAR | Status: AC
  Administered 2019-02-21: 2 mg
  Filled 2019-02-21: qty 2

## 2019-02-21 MED ORDER — ALTEPLASE 2 MG IJ SOLR
2.0000 mg | Freq: Once | INTRAMUSCULAR | Status: AC
  Administered 2019-02-21: 2 mg
  Filled 2019-02-21 (×2): qty 2

## 2019-02-21 MED ORDER — CHLORHEXIDINE GLUCONATE 4 % EX LIQD
CUTANEOUS | Status: AC
  Filled 2019-02-21: qty 15

## 2019-02-21 MED ORDER — TRACE MINERALS CU-MN-SE-ZN 300-55-60-3000 MCG/ML IV SOLN
INTRAVENOUS | Status: AC
  Administered 2019-02-21: 20:00:00 via INTRAVENOUS
  Filled 2019-02-21: qty 884

## 2019-02-21 MED ORDER — SODIUM CHLORIDE 4 MEQ/ML IV SOLN
INTRAVENOUS | Status: DC
  Administered 2019-02-21 (×2): via INTRAVENOUS
  Filled 2019-02-21 (×13): qty 961.5

## 2019-02-21 NOTE — Progress Notes (Signed)
PROGRESS NOTE  [name]  DOB: 10-09-1960  PCP: Fleet Contras, MD YTK:[id number]  DOA: 02/08/2019  LOS: 13 days   Chief Complaint  Patient presents with  . Abdominal Pain   Brief narrative: 58 year old with past medical history significant for cirrhosis with splenomegaly, esophageal varices, hepatitis C, adrenal insufficiency, hypothyroidism, schizoaffective disorder, PTSD, GI bleed, neuropathy, chronic kidney disease. Patient presented to the emergency department on 11/12 due to worsening abdominal pain, distention associated with rectal bleeding for 1 day.   Work-up revealed bowel perforation and pneumoperitoneum.  Surgery consulted and recommended no operative intervention due to high risk of mortality, antibiotics and bowel rest.  GI was consulted for possible paracentesis due to significant ascites as well as dark maroon stool and acute blood loss anemia.  Protonix infusion was initiated as well as octreotide tried as patient has history of esophageal varices.  Patient developed low blood pressure.  CCM was consulted and patient was transferred to ICU.  Patient was transferred back to triad service on 02/12/2019.  Patient underwent UGI on 11/16 and he was found to have focal contained perforation in the distal stomach.  General surgery and GI recommended keep NPO.  No surgical intervention due to poor surgical candidate.  Patient was treated with supportive care, subsequently he was a started on full liquid diet.  Patient abdominal pain got worse, repeated CT abdomen showed large volume of free intraluminal fluid and pneumoperitoneum. Bowel perforation near pylorus or duodenum.  Patient white count got worse.  He was a still getting IV antibiotics and IV Protonix.  His kidney function continued to get worse. Beside aggressive care patient has not improved. Patient does not have POA or family here in the [place].  Palliative care meeting for goals of care with close friends.  Patient was  evaluated by psych he does not have capacity to make his own medical decisions.  Subjective: Patient was seen and examined this morning.  Alert, awake, able to tell his name and date of birth.  No other appropriate response.  Seems to have some hallucinations as well.  Assessment/Plan: Septic Shock due to bowel perforation/pneumoperitoneum/gastric perforation: -Evaluated by CCS and deemed not to be a surgical candidate given high mortality. -Patient endoscopy with no esophageal varices. -Patient was on vasopressors at one point during his stay in the ICU. -He has received a couple of dose of albumin. -He is currently on midodrine.  On IV Zosyn. -GI series showed gastric perforation with contained pneumoperitoneum. -Palliative consulted again for goals of care, recommended psych consult for capacity evaluation.  -He is still complaining of abdominal pain. He had paracentesis yielding 5 L.  -Completed protonix gtt. On  IV Protonix BID.  -WBC -increase to  30, CT abdomen 11-21 showed large volume of free intraluminal fluid and pneumoperitoneum. Bowel perforation near pylorus or duodenum.  Surgery is recommending Bowel rest, TPN.  On TPN.  Continue with medical management.  Palliative care consult appreciated.  Patient does not have family in the [place].  Friends have not made a decision yet.  History of adrenal insufficiency: He is on 50 mg of prednisone daily out patient.  Currently on IV hydrocortisone low dose.   Cirrhosis with ascites: Meld score 23 , Child Pugh C; History of esophageal varices, HCV (treated with Epclusa in 2018, followed by liver clinic at [facility]). GI following, appreciate assistance Underwent paracentesis on 11/18 with WBC more than  2000. Body fluid no growth to date.   Anemia with GI bleed: Due  to known gastric and duodenal ulcer had EGD Oct 2020 which showed grade D esophagitis, 2cm hiatal hernia, 5cm non-bleeding gastric ulcer and non-bleeding duodenal  ulcers). Continue with PPI  Acute renal failure in the setting of chronic kidney disease a stage III: Creatinine baseline 1.7--2.0 Renal ultrasound showed chronic kidney disease Nephrology has signed off Worsening renal function. Nephrology consulted and following.  On TPN  Hyponatremia: resolved with D5 . Stop IV fluids.   Hypokalemia; resolved  History of PTSD and schizoaffective disorder chronic pain On fentanyl Patient expressed some suicidal ideation, psychiatric was consulted.  He was cleared by psych. Continue with haldol and cogentin.  Evaluated by Psych, patient does not have capacity to make medical decision.   Metabolic acidosis;  Improved with bicarb gtt.  Monitor.    DVT prophylaxis: SCD Code Status: Full code Family Communication: Not at bedside today. Disposition Plan: palliative care meeting 11-24. Patient is poor prognosis. I recommend comfort care and DNR.   Still remains full code Consultants:  General surgery GI  Procedures:   US paracentesis 02/14/2019  Antimicrobials: Anti-infectives (From admission, onward)   Start     Dose/Rate Route Frequency Ordered Stop   02/08/19 2200  piperacillin-tazobactam (ZOSYN) IVPB 3.375 g     3.375 g 12.5 mL/hr over 240 Minutes Intravenous Every 8 hours 02/08/19 1552     02/08/19 1545  piperacillin-tazobactam (ZOSYN) IVPB 3.375 g     3.375 g 100 mL/hr over 30 Minutes Intravenous  Once 02/08/19 1533 02/08/19 1840      Diet Order            Diet NPO time specified Except for: Sips with Meds  Diet effective now              Infusions:  . sodium chloride    . sodium chloride Stopped (02/12/19 1737)  . sodium chloride Stopped (02/19/19 1422)  . piperacillin-tazobactam (ZOSYN)  IV 3.375 g (02/21/19 1051)  . dextrose 10 % with additives Pediatric IV fluid 85 mL/hr at 02/21/19 0725  . TPN ADULT (ION) Stopped (02/20/19 2245)  . TPN ADULT (ION)      Scheduled Meds: . benztropine mesylate  0.5 mg  Intramuscular Daily  . Chlorhexidine Gluconate Cloth  6 each Topical Daily  . haloperidol  2 mg Oral BID  . hydrocortisone sod succinate (SOLU-CORTEF) inj  25 mg Intravenous Q12H  . insulin aspart  0-9 Units Subcutaneous Q4H  . midodrine  10 mg Oral TID WC  . Muscle Rub   Topical BID  . pantoprazole (PROTONIX) IV  40 mg Intravenous Q12H  . sodium chloride flush  10-40 mL Intracatheter Q12H    PRN meds: Place/Maintain arterial line **AND** sodium chloride, sodium chloride, fentaNYL (SUBLIMAZE) injection, haloperidol lactate, lidocaine (PF), ondansetron (ZOFRAN) IV, sodium chloride flush, sodium chloride flush, sodium chloride flush   Objective: Vitals:   02/21/19 0451 02/21/19 1407  BP: 135/73 139/69  Pulse: 86 80  Resp: 19 18  Temp: 97.7 F (36.5 C) 97.7 F (36.5 C)  SpO2: 98% 96%    Intake/Output Summary (Last 24 hours) at 02/21/2019 1547 Last data filed at 02/21/2019 1200 Gross per 24 hour  Intake 0 ml  Output -  Net 0 ml   Filed Weights   02/11/19 0501 02/12/19 0136 02/19/19 0500  Weight: 109.9 kg 109.6 kg 114.2 kg   Weight change:  Body mass index is 29.09 kg/m.   Physical Exam: General exam: Lying on bed.  Alert, awake, not in physical  distress at the time of my evaluation Skin: No rashes, lesions or ulcers. HEENT: Atraumatic, normocephalic, supple neck, no obvious bleeding Lungs: Clear to auscultate bilaterally CVS: Regular rate and rhythm, no murmur GI/Abd distended abdomen, nontender CNS: Alert, awake, knows he is in the hospital, can tell his date of birth.  No other appropriate response. Psychiatry: Depressed mood Extremities: Trace bilateral pedal edema bilaterally  Data Review: I have personally reviewed the laboratory data and studies available.  Recent Labs  Lab 02/16/19 1141 02/17/19 0304 02/18/19 0324 02/19/19 0556 02/20/19 0807  WBC 31.2* 30.9* 31.7* 29.1* 32.2*  NEUTROABS  --   --   --  25.9*  --   HGB 10.5* 10.0* 10.2* 11.0* 10.9*   HCT 32.4* 30.2* 31.5* 33.9* 33.9*  MCV 91.0 90.4 91.0 91.1 93.4  PLT 198 194 243 277 257   Recent Labs  Lab 02/17/19 0800 02/18/19 0324 02/19/19 0556 02/20/19 0807 02/21/19 1128  NA 133* 134* 135 135 139  K 3.9 3.5 3.3* 3.7 3.4*  CL 103 103 102 106 106  CO2 18* 19* 20* 16* 20*  GLUCOSE 121* 94 115* 149* 113*  BUN 46* 48* 52* 55* 58*  CREATININE 2.71* 2.89* 3.04* 3.12* 3.09*  CALCIUM 9.6 9.7 9.8 9.8 10.3  MG  --   --  2.3  --  2.3  PHOS  --   --  4.5  --  3.9    Terrilee Croak, MD  [facility] 02/21/2019

## 2019-02-21 NOTE — Progress Notes (Signed)
Upon entering patient's room at approximately 1545, this RN saw the pt's PICC line lying on the floor.  Assessed patient and he was not bleeding from the site.  At this time, IV team arrived to patient's bedside and informed her that patient had pulled out his PICC.  [name]

## 2019-02-21 NOTE — Progress Notes (Signed)
Nutrition Follow-up  DOCUMENTATION CODES:   Not applicable  INTERVENTION:   -TPN management per pharmacy  NUTRITION DIAGNOSIS:   Increased nutrient needs related to chronic illness(cirrhosis) as evidenced by estimated needs.  Ongoing  GOAL:   Patient will meet greater than or equal to 90% of their needs  Met with TPN  MONITOR:   Skin, Weight trends, Labs, I & O's, Other (Comment)(TPN)  REASON FOR ASSESSMENT:   Consult New TPN/TNA  ASSESSMENT:   58 y.o. male with medical history significant of cirrhosis with splenomegaly, esophageal varices, hepatitis C, adrenal insufficiency, hypothyroidism, schizoaffective disorder, PTSD, GI bleed, neuropathy, chronic kidney disease presented to emergency department due to worsening abdominal pain, distention associated with rectal bleeding for 1 day.  Work-up revealed bowel perforation/pneumoperitoneum.  11/13- s/p paracentesis (removed 2860 ml fluid) 11/17- PICC placed 11/18- s/p LLQ paracentesis (5 L clear yellow fluid removed), advanced to clear liquid diet, pt pulled PICC line 11/21- CT revealed large volume pneumoperitoneum with gas between liver and duodenum 11/22- TPN re-started, PICC placed  Reviewed I/O's: -50 ml x 24 hours and +11.3 L since admission  Per MD notes, pt with poor prognosis and poor surgical candidate. Palliative care team following for further goals of care discussions.   Pt lying in bed shouting at time of time of visit (pt continuously repeated phrase that RD unable to understand.   TPN held yesterday due to occluded PICC line. PICC replaced today. Pt to receive TPN at 85 ml/hr, which provides 2460 kcals and 132 grams protein, which meets 100% of estimated kcal and protein needs.   Labs reviewed: K: 3.4, CBGS: 105-115 (inpatient orders for glycemic control are 0-9 units insulin aspart every 4 hours).   Diet Order:   Diet Order            Diet NPO time specified Except for: Sips with Meds  Diet  effective now              EDUCATION NEEDS:   Not appropriate for education at this time  Skin:  Skin Assessment: Reviewed RN Assessment  Last BM:  02/20/19 (50 ml via rectal pouch)  Height:   Ht Readings from Last 1 Encounters:  02/09/19 '6\' 6"'$  (1.981 m)    Weight:   Wt Readings from Last 1 Encounters:  02/19/19 114.2 kg    Ideal Body Weight:  97 kg  BMI:  Body mass index is 29.09 kg/m.  Estimated Nutritional Needs:   Kcal:  8891-6945  Protein:  125-145 grams  Fluid:  Per MD    [place] A. Jimmye Norman, RD, LDN, [place] Registered Dietitian II Certified Diabetes Care and Education Specialist Pager: [phone] After hours Pager: [phone]

## 2019-02-21 NOTE — Progress Notes (Signed)
VAST to bedside to remove TPA from PICC. Upon arrival pt's nurse stated she just now found PICC lying on the floor. Pt was not bleeding from site. PICC tubing measured 40cms. RA PICC site dressed with vaseline gauze, gauze, and Tegaderm.

## 2019-02-21 NOTE — Procedures (Signed)
Interventional Radiology Procedure Note  Procedure: PICC line  Complications: None  Estimated Blood Loss: < 10 mL  Findings: 38 cm DL Power PICC via right brachial vein. Tip at SVC/RA junction. OK to use.  Venetia Night. Kathlene Cote, M.D Pager:  [phone]

## 2019-02-21 NOTE — TOC Progression Note (Signed)
Transition of Care [facility]) - Progression Note    Patient Details  Name: [name] MRN: [id number] Date of Birth: 1960-11-09  [facility] CM/SW [place], [place] Phone Number: 02/21/2019, 1:21 PM  Clinical Narrative:    [facility] team following for continued support when disposition clarified. Appreciate PMT assistance and support provided to pt friends.    Expected Discharge Plan: Skilled Nursing Facility Barriers to Discharge: Family Issues, Continued Medical Work up  Expected Discharge Plan and Services Expected Discharge Plan: [place] In-house Referral: Clinical Social Work Discharge Planning Services: CM Consult Living arrangements for the past 2 months: Apartment   Social Determinants of Health (SDOH) Interventions    Readmission Risk Interventions Readmission Risk Prevention Plan 01/11/2019  Transportation Screening Complete  Medication Review Press photographer) Complete  PCP or Specialist appointment within 3-5 days of discharge Complete  HRI or [place] Complete  SW Recovery Care/Counseling Consult Complete  [place] Not Applicable  Some recent data might be hidden

## 2019-02-21 NOTE — Progress Notes (Addendum)
PHARMACY - ADULT TOTAL PARENTERAL NUTRITION CONSULT NOTE   Pharmacy Consult for TPN Indication: Stomach perforation on bowel rest  Patient Measurements: Height: 6\' 6"  (198.1 cm) Weight: 251 lb 12.3 oz (114.2 kg) IBW/kg (Calculated) : 91.4 TPN AdjBW (KG): 107.4 Body mass index is 29.09 kg/m. Usual Weight: ~100kg  Assessment: 58 y/o [place] male  presenting 11/14 with abdominal pain/distention and rectal bleeding, found with bowel perforation tx with medical management and bowel rest.  Paracentesis 11/13. Started liquids on 11/18-19 now back on bowel rest and pharmacy consulted to start TPN.  Little PO intake since admission and prealbumin <5.    GI: wrosened CT 11/21 pneumoperitoneum - gas between liver/duodenum. Perforation of distal stomach, GIB. Poor surgical candidate. Umbilical hernia note-stable. Rectal tube with LBM 11/22. Prealbumin <5. - IV PPI q 12 hrs  Endo: A1c 3.9: CBGs WNL - also on Solucortef for adrenal insuff. (Prednisone PTA) Insulin requirements in the past 24 hours:  3 units last 24h  - on sens SSI Lytes: Pt refused labs 11/25 Renal: CKD3> SCr 3.04>>3.12 (BL 1.7-2), UOP. None recorded Pulm: RA Cards: VSS - midodrine TID Hepatobil: Hx cirrhosis Child Pugh C/ascites. Paracentesis.  Tbili 2.3>1.8 down, AST/ALT wnl, TG wnl 100 Neuro: Schizoaffective disorder, chronic pain on Bentropine IM daily, Intermittently agitated. ID: Zosyn for peritonitis 11/13>>  . Afebrile. WBC 29.1>>32.2 up. Adjust Zosyn dose as renal function worsens.   TPN Access: PICC 11/22 - pending exchange to be done today (TPN has been held since last night)  TPN start date: 11/22 Nutritional Goals (per RD recommendation on 11/23): KCal: 8921-1941 Protein: 125-145g Fluid: per MD Goal TPN rate is 85 ml/hr  Current Nutrition:  NPO  Plan:  Continue goal TPN at 52ml/hr This TPN provides 132 g of protein, 387 g of dextrose, and 61 g of lipids which provides 2460 kCals per day (100%) Electrolytes  in TPN: Max acetate. no Mg or Phos or Ca Add MVI, trace elements Continue sensitive SSI and adjust as needed TPN labs MonThurs and prn F/U continued goc discussions  Barth Kirks, PharmD, BCPS, BCCCP Clinical Pharmacist [phone]  Please check AMION for all [place] numbers  02/21/2019 10:13 AM

## 2019-02-21 NOTE — Progress Notes (Signed)
VAST consulted to draw am labs and call pharmacy with questions. This RN called pharmacy and informed PICC is incorrectly located and needs to be exchanged in order to obtain blood for labs. Pharmacy staff verbalized frustration that labs have not been obtained as they are needed for TPN mixing.  Called lab tech who stated patient refused lab draw this am. She stated she would attempt one more time to draw labs from pt with assistance of unit nurse.  Called unit nurse, Mendel Ryder and asked that she help lab tech with pt in attempting to obtain labs. Advised to call pharmacy if labs were unable to be obtained.  Advised PICC nurse to obtain labs with PICC exchange if labs unable to be obtained by lab.

## 2019-02-22 LAB — GLUCOSE, CAPILLARY
Glucose-Capillary: 114 mg/dL — ABNORMAL HIGH (ref 70–99)
Glucose-Capillary: 129 mg/dL — ABNORMAL HIGH (ref 70–99)
Glucose-Capillary: 129 mg/dL — ABNORMAL HIGH (ref 70–99)
Glucose-Capillary: 130 mg/dL — ABNORMAL HIGH (ref 70–99)
Glucose-Capillary: 136 mg/dL — ABNORMAL HIGH (ref 70–99)
Glucose-Capillary: 137 mg/dL — ABNORMAL HIGH (ref 70–99)

## 2019-02-22 LAB — COMPREHENSIVE METABOLIC PANEL
ALT: 15 U/L (ref 0–44)
AST: 20 U/L (ref 15–41)
Albumin: 2.2 g/dL — ABNORMAL LOW (ref 3.5–5.0)
Alkaline Phosphatase: 63 U/L (ref 38–126)
Anion gap: 11 (ref 5–15)
BUN: 55 mg/dL — ABNORMAL HIGH (ref 6–20)
CO2: 22 mmol/L (ref 22–32)
Calcium: 9.9 mg/dL (ref 8.9–10.3)
Chloride: 104 mmol/L (ref 98–111)
Creatinine, Ser: 2.9 mg/dL — ABNORMAL HIGH (ref 0.61–1.24)
GFR calc Af Amer: 26 mL/min — ABNORMAL LOW (ref >60)
GFR calc non Af Amer: 23 mL/min — ABNORMAL LOW (ref >60)
Glucose, Bld: 141 mg/dL — ABNORMAL HIGH (ref 70–99)
Potassium: 3.4 mmol/L — ABNORMAL LOW (ref 3.5–5.1)
Sodium: 137 mmol/L (ref 135–145)
Total Bilirubin: 1.5 mg/dL — ABNORMAL HIGH (ref 0.3–1.2)
Total Protein: 5.9 g/dL — ABNORMAL LOW (ref 6.5–8.1)

## 2019-02-22 LAB — MAGNESIUM: Magnesium: 2 mg/dL (ref 1.7–2.4)

## 2019-02-22 LAB — PHOSPHORUS: Phosphorus: 3.5 mg/dL (ref 2.5–4.6)

## 2019-02-22 MED ORDER — TRACE MINERALS CU-MN-SE-ZN 300-55-60-3000 MCG/ML IV SOLN
INTRAVENOUS | Status: AC
  Administered 2019-02-22: 17:00:00 via INTRAVENOUS
  Filled 2019-02-22: qty 884

## 2019-02-22 MED ORDER — HALOPERIDOL 1 MG PO TABS
2.0000 mg | ORAL_TABLET | Freq: Every day | ORAL | Status: DC
  Administered 2019-02-23 – 2019-03-26 (×30): 2 mg via ORAL
  Filled 2019-02-22 (×33): qty 2

## 2019-02-22 MED ORDER — POTASSIUM CHLORIDE 10 MEQ/50ML IV SOLN
10.0000 meq | INTRAVENOUS | Status: AC
  Administered 2019-02-22 (×4): 10 meq via INTRAVENOUS
  Filled 2019-02-22 (×4): qty 50

## 2019-02-22 MED ORDER — OLANZAPINE 5 MG PO TBDP
2.5000 mg | ORAL_TABLET | Freq: Every day | ORAL | Status: DC
  Administered 2019-02-22 – 2019-02-28 (×7): 2.5 mg via ORAL
  Filled 2019-02-22 (×9): qty 0.5

## 2019-02-22 NOTE — Progress Notes (Signed)
PHARMACY - ADULT TOTAL PARENTERAL NUTRITION CONSULT NOTE   Pharmacy Consult for TPN Indication: Stomach perforation on bowel rest  Patient Measurements: Height: 6\' 6"  (198.1 cm) Weight: 251 lb 12.3 oz (114.2 kg) IBW/kg (Calculated) : 91.4 TPN AdjBW (KG): 107.4 Body mass index is 29.09 kg/m. Usual Weight: ~100kg  Assessment: 58 y/o [place] male  presenting 11/14 with abdominal pain/distention and rectal bleeding, found with bowel perforation tx with medical management and bowel rest.  Paracentesis 11/13. Started liquids on 11/18-19 now back on bowel rest and pharmacy consulted to start TPN.  Little PO intake since admission and prealbumin <5.    GI: worsened CT 11/21 pneumoperitoneum - gas between liver/duodenum. Perforation of distal stomach, GIB. Poor surgical candidate. Umbilical hernia note-stable. Rectal tube with LBM 11/25. Prealbumin <5. - IV PPI q 12 hrs  Endo: A1c 3.9: CBGs WNL - also on Solucortef for adrenal insuff. (Prednisone PTA) Insulin requirements in the past 24 hours:  1 units last 24h  - on sens SSI Lytes: K 3.4 (will replace), Mag 2, Phos 3.5 Renal: CKD3> SCr 3.04>>3.12>2.9 (BL 1.7-2), UOP x 3  Pulm: RA Cards: VSS - midodrine TID Hepatobil: Hx cirrhosis Child Pugh C/ascites. Paracentesis.  Tbili 2.3>1.8>1.5 down, AST/ALT wnl, TG wnl 100 Neuro: Schizoaffective disorder, chronic pain on Bentropine IM daily, Intermittently agitated. ID: Zosyn for peritonitis 11/13>>  . Afebrile. WBC 29.1>>32.2 up. Adjust Zosyn dose as renal function worsens.   TPN Access: PICC 11/25  TPN start date: 11/22 Nutritional Goals (per RD recommendation on 11/23): KCal: 3893-7342 Protein: 125-145g Fluid: per MD Goal TPN rate is 85 ml/hr  Current Nutrition:  NPO  Plan:  Continue goal TPN at 25ml/hr This TPN provides 132 g of protein, 387 g of dextrose, and 61 g of lipids which provides 2460 kCals per day (100%) Electrolytes in TPN: Max acetate. Slight incr in K, no Mg or Phos or  Ca Add MVI, trace elements Continue sensitive SSI and adjust as needed TPN labs MonThurs and prn Replace with KCL 10 meq IV x 4 F/U continued goc discussions  Alanda Slim, PharmD, [facility] Clinical Pharmacist Please see AMION for all Pharmacists' Contact Phone Numbers 02/22/2019, 7:31 AM

## 2019-02-22 NOTE — Progress Notes (Signed)
Palliative follow up  Reviewed chart.  Visited patient at bedside.  Discussed with RN.  Discussed with Surgery.  Patient appears more stable today.  For the first time he tells he he has no pain.  He seems less needy and demanding.  This is an improvement from Tuesday.  Awake, alert, calm CV rrr no m/r/g Resp no distress Abdomen - distended, tight, with periumbilical hernia   Per notes he is likely hallucinating.  Will exchange one dose of Haldol for very low dose Olanzapine QHS.    Per discussion with surgery his chances of surviving this illness are much less than 50%.    Patient has no family.  He does have a group of family friends that are considering his condition and goals of care.   They have offered to meet again after the holiday weekend.  Plan -  1.  Exchange one of the scheduled BID doses of Haldol for a QHS dose of Olanzepine 2.5 mg - this is for mood stabilization and delirium.    2. PMT provider will follow with you and contact the friends Daleen Snook) next week for a follow up [place] meeting.  3.  Continue full scope full code for now.  Please contact Daleen Snook if he has a sudden acute decline as I believe she would opt for DNR/DNI.  [name], PA-C Palliative Medicine Office:  [phone]  25 min.

## 2019-02-22 NOTE — Progress Notes (Signed)
PROGRESS NOTE  [name]  DOB: 04/13/1960  PCP: Nolene Ebbs, MD LPF:[id number]  DOA: 02/08/2019  LOS: 14 days   Chief Complaint  Patient presents with  . Abdominal Pain   Brief narrative: 58 year old with past medical history significant for cirrhosis with splenomegaly, esophageal varices, hepatitis C, adrenal insufficiency, hypothyroidism, schizoaffective disorder, PTSD, GI bleed, neuropathy, chronic kidney disease. Patient presented to the emergency department on 11/12 due to worsening abdominal pain, distention associated with rectal bleeding for 1 day.   Work-up revealed bowel perforation and pneumoperitoneum.  Surgery consulted and recommended no operative intervention due to high risk of mortality. Started on antibiotics and bowel rest.  GI was consulted for dark maroon stool and acute blood loss anemia.  Protonix and octreotide infusion were initiated as patient has history of esophageal varices.  Patient developed low blood pressure.  CCM was consulted and patient was transferred to ICU.  Patient was transferred back to triad service on 02/12/2019.  Patient underwent UGI on 11/16 and he was found to have focal contained perforation in the distal stomach.  General surgery and GI recommended to keep NPO.  No surgical intervention due to poor surgical candidate.  Patient was treated with supportive care, subsequently he was a started on full liquid diet.  Patient abdominal pain got worse, repeated CT abdomen showed large volume of free intraluminal fluid and pneumoperitoneum. Bowel perforation near pylorus or duodenum.  Patient white count got worse.  He is still getting IV antibiotics and IV Protonix.  His kidney function continued to get worse. Beside aggressive care patient has not improved. Patient does not have POA or family here in the [place].  Palliative care meeting for goals of care with close friends.  Patient was evaluated by psych he does not have capacity to make his own  medical decisions.  Subjective: Patient was seen and examined this morning.  Alert, awake, able to tell his name and date of birth.  No other appropriate response.  Does not seem uncomfortable but making moaning sound and cannot elaborate when asked to do so.  Assessment/Plan: Septic Shock due to bowel perforation/pneumoperitoneum/gastric perforation: -Evaluated by CCS and deemed not to be a surgical candidate given high mortality. -11/16 endoscopy with no esophageal varices. -Patient was on vasopressors at one point during his stay in the ICU. -He received a couple of dose of albumin. -He is currently on midodrine.  On IV Zosyn. -GI series showed gastric perforation with contained pneumoperitoneum. -Palliative consulted again for goals of care -He is still complaining of abdominal pain. He had paracentesis yielding 5 L.  -Completed protonix gtt. On  IV Protonix BID.  -WBC elevated to 32,000 on 11/24. CT abdomen 11-21 showed large volume of free intraluminal fluid and pneumoperitoneum. Bowel perforation near pylorus or duodenum.  Surgery is recommending Bowel rest, TPN.  -On TPN.  Continue with medical management.  Palliative care consult appreciated.  Patient does not have family in the [place].  Friends have not made a decision yet about his goals of care.  History of adrenal insufficiency: He is on 50 mg of prednisone daily out patient.  Currently on IV hydrocortisone low dose.   Cirrhosis with ascites: Meld score 23 , Child Pugh C; History of esophageal varices, HCV (treated with Epclusa in 2018, followed by liver clinic at [facility]). GI following, appreciate assistance Underwent paracentesis on 11/18 with WBC more than 2000. Body fluid no growth to date.   Anemia with GI bleed: Due to known gastric and  duodenal ulcer had EGD Oct 2020 which showed grade D esophagitis, 2cm hiatal hernia, 5cm non-bleeding gastric ulcer and non-bleeding duodenal ulcers). Continue with PPI   Acute renal failure in the setting of chronic kidney disease a stage III: Creatinine baseline 1.7--2.0 Renal ultrasound showed chronic kidney disease Nephrology consulted and following.  On TPN.  Creatinine improving, 2.9 today.  Hyponatremia: resolved with D5 . Stop IV fluids.   Hypokalemia; resolved  History of PTSD and schizoaffective disorder chronic pain On fentanyl Patient expressed some suicidal ideation, psychiatric was consulted.  He was cleared by psych. Continue with haldol and cogentin.  Evaluated by Psych, patient does not have capacity to make medical decision.   Metabolic acidosis;  Improved with bicarb gtt.  Monitor.    DVT prophylaxis: SCD Code Status: Full code Family Communication: Not at bedside today. Disposition Plan: palliative care meeting 11-24. Patient is poor prognosis. I recommend comfort care and DNR.   Still remains full code.  Consultants:  General surgery GI  Procedures:   US paracentesis 02/14/2019  Antimicrobials: Anti-infectives (From admission, onward)   Start     Dose/Rate Route Frequency Ordered Stop   02/08/19 2200  piperacillin-tazobactam (ZOSYN) IVPB 3.375 g     3.375 g 12.5 mL/hr over 240 Minutes Intravenous Every 8 hours 02/08/19 1552     02/08/19 1545  piperacillin-tazobactam (ZOSYN) IVPB 3.375 g     3.375 g 100 mL/hr over 30 Minutes Intravenous  Once 02/08/19 1533 02/08/19 1840      Diet Order            Diet NPO time specified Except for: Sips with Meds  Diet effective now              Infusions:  . sodium chloride    . sodium chloride Stopped (02/12/19 1737)  . sodium chloride Stopped (02/19/19 1422)  . piperacillin-tazobactam (ZOSYN)  IV 3.375 g (02/22/19 0954)  . potassium chloride 10 mEq (02/22/19 1212)  . dextrose 10 % with additives Pediatric IV fluid Stopped (02/21/19 2046)  . TPN ADULT (ION) 85 mL/hr at 02/22/19 0506  . TPN ADULT (ION)      Scheduled Meds: . benztropine mesylate  0.5 mg  Intramuscular Daily  . Chlorhexidine Gluconate Cloth  6 each Topical Daily  . [START ON 02/23/2019] haloperidol  2 mg Oral Daily  . hydrocortisone sod succinate (SOLU-CORTEF) inj  25 mg Intravenous Q12H  . insulin aspart  0-9 Units Subcutaneous Q4H  . midodrine  10 mg Oral TID WC  . Muscle Rub   Topical BID  . OLANZapine zydis  2.5 mg Oral QHS  . pantoprazole (PROTONIX) IV  40 mg Intravenous Q12H  . sodium chloride flush  10-40 mL Intracatheter Q12H    PRN meds: Place/Maintain arterial line **AND** sodium chloride, sodium chloride, fentaNYL (SUBLIMAZE) injection, haloperidol lactate, lidocaine (PF), lidocaine, ondansetron (ZOFRAN) IV, sodium chloride flush, sodium chloride flush, sodium chloride flush   Objective: Vitals:   02/22/19 0011 02/22/19 0451  BP: 136/78 130/81  Pulse: 87 83  Resp:  18  Temp:  98 F (36.7 C)  SpO2: 98% 98%    Intake/Output Summary (Last 24 hours) at 02/22/2019 1257 Last data filed at 02/22/2019 1023 Gross per 24 hour  Intake 2092.3 ml  Output 350 ml  Net 1742.3 ml   Filed Weights   02/11/19 0501 02/12/19 0136 02/19/19 0500  Weight: 109.9 kg 109.6 kg 114.2 kg   Weight change:  Body mass index is 29.09 kg/m.  Physical Exam: General exam: Lying on bed.  Alert, awake, not in physical distress at the time of my evaluation Skin: No rashes, lesions or ulcers. HEENT: Atraumatic, normocephalic, supple neck, no obvious bleeding Lungs: Clear to auscultate bilaterally CVS: Regular rate and rhythm, no murmur GI/Abd distended abdomen from ascites, nontender CNS: Alert, awake, knows he is in the hospital, can tell his date of birth.  No other appropriate response. Psychiatry: Depressed mood Extremities: Trace bilateral pedal edema bilaterally  Data Review: I have personally reviewed the laboratory data and studies available.  Recent Labs  Lab 02/16/19 1141 02/17/19 0304 02/18/19 0324 02/19/19 0556 02/20/19 0807  WBC 31.2* 30.9* 31.7* 29.1*  32.2*  NEUTROABS  --   --   --  25.9*  --   HGB 10.5* 10.0* 10.2* 11.0* 10.9*  HCT 32.4* 30.2* 31.5* 33.9* 33.9*  MCV 91.0 90.4 91.0 91.1 93.4  PLT 198 194 243 277 257   Recent Labs  Lab 02/18/19 0324 02/19/19 0556 02/20/19 0807 02/21/19 1128 02/22/19 0444  NA 134* 135 135 139 137  K 3.5 3.3* 3.7 3.4* 3.4*  CL 103 102 106 106 104  CO2 19* 20* 16* 20* 22  GLUCOSE 94 115* 149* 113* 141*  BUN 48* 52* 55* 58* 55*  CREATININE 2.89* 3.04* 3.12* 3.09* 2.90*  CALCIUM 9.7 9.8 9.8 10.3 9.9  MG  --  2.3  --  2.3 2.0  PHOS  --  4.5  --  3.9 3.5    Lorin Glass, MD  [facility] 02/22/2019

## 2019-02-23 LAB — GLUCOSE, CAPILLARY
Glucose-Capillary: 121 mg/dL — ABNORMAL HIGH (ref 70–99)
Glucose-Capillary: 137 mg/dL — ABNORMAL HIGH (ref 70–99)
Glucose-Capillary: 138 mg/dL — ABNORMAL HIGH (ref 70–99)
Glucose-Capillary: 139 mg/dL — ABNORMAL HIGH (ref 70–99)
Glucose-Capillary: 143 mg/dL — ABNORMAL HIGH (ref 70–99)
Glucose-Capillary: 145 mg/dL — ABNORMAL HIGH (ref 70–99)

## 2019-02-23 LAB — CBC WITH DIFFERENTIAL/PLATELET
Abs Immature Granulocytes: 0.16 10*3/uL — ABNORMAL HIGH (ref 0.00–0.07)
Basophils Absolute: 0 10*3/uL (ref 0.0–0.1)
Basophils Relative: 0 %
Eosinophils Absolute: 0 10*3/uL (ref 0.0–0.5)
Eosinophils Relative: 0 %
HCT: 31.5 % — ABNORMAL LOW (ref 39.0–52.0)
Hemoglobin: 10.2 g/dL — ABNORMAL LOW (ref 13.0–17.0)
Immature Granulocytes: 1 %
Lymphocytes Relative: 4 %
Lymphs Abs: 1 10*3/uL (ref 0.7–4.0)
MCH: 30.3 pg (ref 26.0–34.0)
MCHC: 32.4 g/dL (ref 30.0–36.0)
MCV: 93.5 fL (ref 80.0–100.0)
Monocytes Absolute: 1.4 10*3/uL — ABNORMAL HIGH (ref 0.1–1.0)
Monocytes Relative: 6 %
Neutro Abs: 22.4 10*3/uL — ABNORMAL HIGH (ref 1.7–7.7)
Neutrophils Relative %: 89 %
Platelets: 171 10*3/uL (ref 150–400)
RBC: 3.37 MIL/uL — ABNORMAL LOW (ref 4.22–5.81)
RDW: 20.1 % — ABNORMAL HIGH (ref 11.5–15.5)
WBC: 25 10*3/uL — ABNORMAL HIGH (ref 4.0–10.5)
nRBC: 0 % (ref 0.0–0.2)

## 2019-02-23 LAB — COMPREHENSIVE METABOLIC PANEL
ALT: 14 U/L (ref 0–44)
AST: 16 U/L (ref 15–41)
Albumin: 2.1 g/dL — ABNORMAL LOW (ref 3.5–5.0)
Alkaline Phosphatase: 63 U/L (ref 38–126)
Anion gap: 8 (ref 5–15)
BUN: 63 mg/dL — ABNORMAL HIGH (ref 6–20)
CO2: 24 mmol/L (ref 22–32)
Calcium: 10.4 mg/dL — ABNORMAL HIGH (ref 8.9–10.3)
Chloride: 108 mmol/L (ref 98–111)
Creatinine, Ser: 2.5 mg/dL — ABNORMAL HIGH (ref 0.61–1.24)
GFR calc Af Amer: 32 mL/min — ABNORMAL LOW (ref >60)
GFR calc non Af Amer: 27 mL/min — ABNORMAL LOW (ref >60)
Glucose, Bld: 149 mg/dL — ABNORMAL HIGH (ref 70–99)
Potassium: 3.9 mmol/L (ref 3.5–5.1)
Sodium: 140 mmol/L (ref 135–145)
Total Bilirubin: 1.8 mg/dL — ABNORMAL HIGH (ref 0.3–1.2)
Total Protein: 6 g/dL — ABNORMAL LOW (ref 6.5–8.1)

## 2019-02-23 LAB — MAGNESIUM: Magnesium: 1.9 mg/dL (ref 1.7–2.4)

## 2019-02-23 LAB — PHOSPHORUS: Phosphorus: 2.6 mg/dL (ref 2.5–4.6)

## 2019-02-23 LAB — AMMONIA: Ammonia: 13 umol/L (ref 9–35)

## 2019-02-23 MED ORDER — TRACE MINERALS CU-MN-SE-ZN 300-55-60-3000 MCG/ML IV SOLN
INTRAVENOUS | Status: AC
  Administered 2019-02-23: 18:00:00 via INTRAVENOUS
  Filled 2019-02-23: qty 884

## 2019-02-23 NOTE — Plan of Care (Signed)

## 2019-02-23 NOTE — Progress Notes (Signed)
PROGRESS NOTE  [name]  DOB: May 25, 1960  PCP: Nolene Ebbs, MD FUX:[id number]  DOA: 02/08/2019  LOS: 15 days   Chief Complaint  Patient presents with  . Abdominal Pain   Brief narrative: 58 year old with past medical history significant for cirrhosis with splenomegaly, esophageal varices, hepatitis C, adrenal insufficiency, hypothyroidism, schizoaffective disorder, PTSD, GI bleed, neuropathy, chronic kidney disease. Patient presented to the emergency department on 11/12 due to worsening abdominal pain, distention associated with rectal bleeding for 1 day.   Work-up revealed bowel perforation and pneumoperitoneum.  Surgery consulted and recommended no operative intervention due to high risk of mortality. Started on antibiotics and bowel rest.  GI was consulted for dark maroon stool and acute blood loss anemia.  Protonix and octreotide infusion were initiated as patient has history of esophageal varices.  Patient developed low blood pressure.  CCM was consulted and patient was transferred to ICU.  Patient was transferred back to triad service on 02/12/2019.  Patient underwent UGI on 11/16 and he was found to have focal contained perforation in the distal stomach.  General surgery and GI recommended to keep NPO.  No surgical intervention due to poor surgical candidate.  Patient was treated with supportive care, subsequently he was a started on full liquid diet.  Patient abdominal pain got worse, repeated CT abdomen showed large volume of free intraluminal fluid and pneumoperitoneum. Bowel perforation near pylorus or duodenum.  Patient white count got worse.  He is still getting IV antibiotics and IV Protonix.  His kidney function continued to get worse. Beside aggressive care patient has not improved. Patient does not have POA or family here in the [place].  Palliative care meeting for goals of care with close friends.  Patient was evaluated by psych he does not have capacity to make his own  medical decisions.  Subjective: Patient was seen and examined this morning.  Alert, awake, able to tell his name and date of birth.  No other appropriate response.  Does not seem uncomfortable but making moaning sound and cannot elaborate when asked to do so.  Assessment/Plan: Septic Shock due to bowel perforation/pneumoperitoneum/gastric perforation: -Evaluated by CCS and deemed not to be a surgical candidate given high mortality. -11/16 endoscopy with no esophageal varices. -Patient was on vasopressors at one point during his stay in the ICU. -He received a couple of dose of albumin. -He is currently on midodrine.  On IV Zosyn. -GI series showed gastric perforation with contained pneumoperitoneum. -Palliative consulted again for goals of care -He is still complaining of abdominal pain. He had paracentesis yielding 5 L.  -Completed protonix gtt. On  IV Protonix BID.  -WBC elevated to 32,000 on 11/24. CT abdomen 11-21 showed large volume of free intraluminal fluid and pneumoperitoneum. Bowel perforation near pylorus or duodenum.  Surgery is recommending Bowel rest, TPN.  -On TPN.  Continue with medical management.  Palliative care consult appreciated.  Patient does not have family in the [place].  Friends have not made a decision yet about his goals of care.  History of adrenal insufficiency: He is on 50 mg of prednisone daily out patient.  Currently on IV hydrocortisone low dose.   Cirrhosis with ascites: Meld score 23 , Child Pugh C; History of esophageal varices, HCV (treated with Epclusa in 2018, followed by liver clinic at [facility]). GI following, appreciate assistance Underwent paracentesis on 11/18 with WBC more than 2000. Body fluid no growth to date.   Anemia with GI bleed: Due to known gastric and  duodenal ulcer had EGD Oct 2020 which showed grade D esophagitis, 2cm hiatal hernia, 5cm non-bleeding gastric ulcer and non-bleeding duodenal ulcers. Continue with PPI   Acute renal failure in the setting of chronic kidney disease a stage III: Creatinine baseline 1.7--2.0 Renal ultrasound showed chronic kidney disease Nephrology consulted and following.  On TPN.  Creatinine improving, 2.5 today.  History of PTSD and schizoaffective disorder chronic pain On fentanyl Patient expressed some suicidal ideation, psychiatric was consulted.  He was cleared by psych. Continue with haldol and cogentin.  Evaluated by Psych, patient does not have capacity to make medical decision.    DVT prophylaxis: SCD Code Status: Full code Family Communication: Not at bedside today. Disposition Plan: palliative care meeting 11-24. Patient is poor prognosis. I recommend comfort care and DNR.   Still remains full code.  Consultants:  General surgery GI  Procedures:   US paracentesis 02/14/2019  Antimicrobials: Anti-infectives (From admission, onward)   Start     Dose/Rate Route Frequency Ordered Stop   02/08/19 2200  piperacillin-tazobactam (ZOSYN) IVPB 3.375 g     3.375 g 12.5 mL/hr over 240 Minutes Intravenous Every 8 hours 02/08/19 1552     02/08/19 1545  piperacillin-tazobactam (ZOSYN) IVPB 3.375 g     3.375 g 100 mL/hr over 30 Minutes Intravenous  Once 02/08/19 1533 02/08/19 1840      Diet Order            Diet NPO time specified Except for: Sips with Meds  Diet effective now              Infusions:  . sodium chloride    . sodium chloride Stopped (02/12/19 1737)  . sodium chloride Stopped (02/19/19 1422)  . piperacillin-tazobactam (ZOSYN)  IV 3.375 g (02/23/19 0954)  . dextrose 10 % with additives Pediatric IV fluid Stopped (02/21/19 2046)  . TPN ADULT (ION) 85 mL/hr at 02/23/19 0300  . TPN ADULT (ION)      Scheduled Meds: . benztropine mesylate  0.5 mg Intramuscular Daily  . Chlorhexidine Gluconate Cloth  6 each Topical Daily  . haloperidol  2 mg Oral Daily  . hydrocortisone sod succinate (SOLU-CORTEF) inj  25 mg Intravenous Q12H  .  insulin aspart  0-9 Units Subcutaneous Q4H  . midodrine  10 mg Oral TID WC  . Muscle Rub   Topical BID  . OLANZapine zydis  2.5 mg Oral QHS  . pantoprazole (PROTONIX) IV  40 mg Intravenous Q12H  . sodium chloride flush  10-40 mL Intracatheter Q12H    PRN meds: Place/Maintain arterial line **AND** sodium chloride, sodium chloride, fentaNYL (SUBLIMAZE) injection, haloperidol lactate, lidocaine (PF), lidocaine, ondansetron (ZOFRAN) IV, sodium chloride flush, sodium chloride flush, sodium chloride flush   Objective: Vitals:   02/22/19 2225 02/23/19 0504  BP: 129/67 138/77  Pulse: 86 83  Resp: 18 19  Temp: 97.9 F (36.6 C) 97.9 F (36.6 C)  SpO2: (!) 89% 99%    Intake/Output Summary (Last 24 hours) at 02/23/2019 1358 Last data filed at 02/23/2019 0900 Gross per 24 hour  Intake 1831.83 ml  Output -  Net 1831.83 ml   Filed Weights   02/12/19 0136 02/19/19 0500 02/23/19 0500  Weight: 109.6 kg 114.2 kg 114.2 kg   Weight change:  Body mass index is 29.09 kg/m.   Physical Exam: General exam: Lying on bed.  Alert, awake, not in physical distress at the time of my evaluation Skin: No rashes, lesions or ulcers. HEENT: Atraumatic, normocephalic, supple neck,  no obvious bleeding Lungs: Clear to auscultate bilaterally CVS: Regular rate and rhythm, no murmur GI/Abd distended abdomen from ascites, nontender CNS: Alert, awake, knows he is in the hospital, can tell his date of birth.  No other appropriate response. Psychiatry: Depressed mood Extremities: Trace bilateral pedal edema bilaterally  Data Review: I have personally reviewed the laboratory data and studies available.  Recent Labs  Lab 02/17/19 0304 02/18/19 0324 02/19/19 0556 02/20/19 0807 02/23/19 1214  WBC 30.9* 31.7* 29.1* 32.2* 25.0*  NEUTROABS  --   --  25.9*  --  22.4*  HGB 10.0* 10.2* 11.0* 10.9* 10.2*  HCT 30.2* 31.5* 33.9* 33.9* 31.5*  MCV 90.4 91.0 91.1 93.4 93.5  PLT 194 243 277 257 171   Recent Labs   Lab 02/19/19 0556 02/20/19 0807 02/21/19 1128 02/22/19 0444 02/23/19 1214  NA 135 135 139 137 140  K 3.3* 3.7 3.4* 3.4* 3.9  CL 102 106 106 104 108  CO2 20* 16* 20* 22 24  GLUCOSE 115* 149* 113* 141* 149*  BUN 52* 55* 58* 55* 63*  CREATININE 3.04* 3.12* 3.09* 2.90* 2.50*  CALCIUM 9.8 9.8 10.3 9.9 10.4*  MG 2.3  --  2.3 2.0 1.9  PHOS 4.5  --  3.9 3.5 2.6    Lorin Glass, MD  [facility] 02/23/2019

## 2019-02-23 NOTE — Progress Notes (Signed)
PHARMACY - ADULT TOTAL PARENTERAL NUTRITION CONSULT NOTE   Pharmacy Consult for TPN Indication: Stomach perforation on bowel rest  Patient Measurements: Height: 6\' 6"  (198.1 cm) Weight: 251 lb 12.3 oz (114.2 kg) IBW/kg (Calculated) : 91.4 TPN AdjBW (KG): 107.4 Body mass index is 29.09 kg/m. Usual Weight: ~100kg  Assessment: 58 y/o [place] male  presenting 11/14 with abdominal pain/distention and rectal bleeding, found with bowel perforation tx with medical management and bowel rest.  Paracentesis 11/13. Started liquids on 11/18-19 now back on bowel rest and pharmacy consulted to start TPN.  Little PO intake since admission and prealbumin <5.    GI: worsened CT 11/21 pneumoperitoneum - gas between liver/duodenum. Perforation of distal stomach, GIB. Poor surgical candidate. Umbilical hernia note-stable. Rectal tube with LBM 11/26. Prealbumin <5. - IV PPI q 12 hrs Endo: A1c 3.9: CBGs WNL - also on Solucortef for adrenal insuff. (Prednisone PTA) Insulin requirements in the past 24 hours:  6 units last 24h  - on sens SSI Lytes: K 3.4 (will replace), Mag 2, Phos 3.5 (no labs 11/27) Renal: CKD3> SCr 3.04>>3.12>2.9 (BL 1.7-2), UOP x 3  Pulm: RA Cards: VSS - midodrine TID Hepatobil: Hx cirrhosis Child Pugh C/ascites. Paracentesis.  Tbili 2.3>1.8>1.5 down, AST/ALT wnl, TG wnl 100 Neuro: Schizoaffective disorder, chronic pain on Bentropine IM daily, Intermittently agitated. ID: Zosyn for peritonitis 11/13>>  . Afebrile. WBC 29.1>>32.2 up. Adjust Zosyn dose as renal function worsens.   TPN Access: PICC 11/25  TPN start date: 11/22 Nutritional Goals (per RD recommendation on 11/23): KCal: 3825-0539 Protein: 125-145g Fluid: per MD Goal TPN rate is 85 ml/hr  Current Nutrition:  NPO  Plan:  Continue goal TPN at 53ml/hr This TPN provides 132 g of protein, 387 g of dextrose, and 61 g of lipids which provides 2460 kCals per day (100%) Electrolytes in TPN: Max acetate. Slight incr in K, no Mg  or Phos or Ca Add MVI, trace elements Continue sensitive SSI and adjust as needed TPN labs MonThurs and prn F/U continued goc discussions  Alanda Slim, PharmD, [facility] Clinical Pharmacist Please see AMION for all Pharmacists' Contact Phone Numbers 02/23/2019, 7:09 AM

## 2019-02-24 LAB — BASIC METABOLIC PANEL
Anion gap: 8 (ref 5–15)
BUN: 64 mg/dL — ABNORMAL HIGH (ref 6–20)
CO2: 26 mmol/L (ref 22–32)
Calcium: 10.7 mg/dL — ABNORMAL HIGH (ref 8.9–10.3)
Chloride: 107 mmol/L (ref 98–111)
Creatinine, Ser: 2.25 mg/dL — ABNORMAL HIGH (ref 0.61–1.24)
GFR calc Af Amer: 36 mL/min — ABNORMAL LOW (ref >60)
GFR calc non Af Amer: 31 mL/min — ABNORMAL LOW (ref >60)
Glucose, Bld: 135 mg/dL — ABNORMAL HIGH (ref 70–99)
Potassium: 4.1 mmol/L (ref 3.5–5.1)
Sodium: 141 mmol/L (ref 135–145)

## 2019-02-24 LAB — GLUCOSE, CAPILLARY
Glucose-Capillary: 125 mg/dL — ABNORMAL HIGH (ref 70–99)
Glucose-Capillary: 187 mg/dL — ABNORMAL HIGH (ref 70–99)
Glucose-Capillary: 328 mg/dL — ABNORMAL HIGH (ref 70–99)
Glucose-Capillary: 86 mg/dL (ref 70–99)
Glucose-Capillary: 87 mg/dL (ref 70–99)

## 2019-02-24 LAB — MAGNESIUM: Magnesium: 1.8 mg/dL (ref 1.7–2.4)

## 2019-02-24 LAB — PHOSPHORUS: Phosphorus: 2.3 mg/dL — ABNORMAL LOW (ref 2.5–4.6)

## 2019-02-24 MED ORDER — TRACE MINERALS CU-MN-SE-ZN 300-55-60-3000 MCG/ML IV SOLN
INTRAVENOUS | Status: AC
  Administered 2019-02-24: 17:00:00 via INTRAVENOUS
  Filled 2019-02-24: qty 884

## 2019-02-24 MED ORDER — DEXTROSE 10 % IV SOLN
INTRAVENOUS | Status: AC
  Administered 2019-02-24: 09:00:00 via INTRAVENOUS

## 2019-02-24 MED ORDER — MAGNESIUM SULFATE 2 GM/50ML IV SOLN
2.0000 g | Freq: Once | INTRAVENOUS | Status: AC
  Administered 2019-02-24: 2 g via INTRAVENOUS
  Filled 2019-02-24: qty 50

## 2019-02-24 MED ORDER — SODIUM PHOSPHATES 45 MMOLE/15ML IV SOLN
10.0000 mmol | Freq: Once | INTRAVENOUS | Status: AC
  Administered 2019-02-24: 10 mmol via INTRAVENOUS
  Filled 2019-02-24: qty 3.33

## 2019-02-24 NOTE — Progress Notes (Signed)
Received a consult from the nurse stating the patient pulled his tubing out of the TPN bag. Informed nurse that once the TPN line becomes disconnected, we can't re-connect the line. Instructed her to call the MD and get an order for D10 until the new bag of TPN is ready for 1800.

## 2019-02-24 NOTE — Progress Notes (Signed)
PROGRESS NOTE  [name]  DOB: 1961/01/23  PCP: Fleet Contras, MD QGB:[id number]  DOA: 02/08/2019  LOS: 16 days   Chief Complaint  Patient presents with  . Abdominal Pain   Brief narrative: 58 year old with past medical history significant for cirrhosis with splenomegaly, esophageal varices, hepatitis C, adrenal insufficiency, hypothyroidism, schizoaffective disorder, PTSD, GI bleed, neuropathy, chronic kidney disease. Patient presented to the emergency department on 11/12 due to worsening abdominal pain, distention associated with rectal bleeding for 1 day.   Work-up revealed bowel perforation and pneumoperitoneum.  Surgery consulted and recommended no operative intervention due to high risk of mortality. Started on antibiotics and bowel rest.  GI was consulted for dark maroon stool and acute blood loss anemia.  Protonix and octreotide infusion were initiated as patient has history of esophageal varices.  Patient developed low blood pressure.  CCM was consulted and patient was transferred to ICU.  Patient was transferred back to triad service on 02/12/2019.  Patient underwent UGI on 11/16 and he was found to have focal contained perforation in the distal stomach.  General surgery and GI recommended to keep NPO.  No surgical intervention due to poor surgical candidate.  Patient was treated with supportive care, subsequently he was a started on full liquid diet.  Patient abdominal pain got worse, repeated CT abdomen showed large volume of free intraluminal fluid and pneumoperitoneum. Bowel perforation near pylorus or duodenum.  Patient white count got worse.  He is still getting IV antibiotics and IV Protonix.  His kidney function continued to get worse. Beside aggressive care patient has not improved. Patient does not have POA or family here in the [place].  Palliative care meeting for goals of care with close friends.  Patient was evaluated by psych he does not have capacity to make his own  medical decisions.  Subjective: Patient was seen and examined this morning. He probably has learned to repeat his name and where he is.  Unable to answer any other question.  Per RN, patient was confused and agitated earlier.  He pulled the TPN tubing.  Assessment/Plan: Septic Shock due to bowel perforation/pneumoperitoneum/gastric perforation: -Evaluated by [facility]CS and deemed not to be a surgical candidate given high mortality. -11/16 endoscopy with no esophageal varices. -Patient was on vasopressors at one point during his stay in the ICU. -He received a couple of dose of albumin. -He is currently on midodrine.  On IV Zosyn. -GI series showed gastric perforation with contained pneumoperitoneum. -Palliative consulted again for goals of care -He is still complaining of abdominal pain. He had paracentesis yielding 5 L.  -Completed protonix gtt. On  IV Protonix BID.  -WBC elevated to 32,000 on 11/24. CT abdomen 11-21 showed large volume of free intraluminal fluid and pneumoperitoneum. Bowel perforation near pylorus or duodenum.  Surgery is recommending Bowel rest, TPN.  -On TPN.  Continue with medical management.  Palliative care consult appreciated.  Patient does not have family in the [place].  Friends have not made a decision yet about his goals of care. -Patient has remained n.p.o. for several days now.  I wonder if he can be started on some clear liquid diet.  Unable to reach general surgery today.  History of adrenal insufficiency: He is on 50 mg of prednisone daily out patient.  Currently on IV hydrocortisone low dose.   Cirrhosis with ascites: Meld score 23 , Child Pugh C; History of esophageal varices, HCV (treated with Epclusa in 2018, followed by liver clinic at [facility]). GI following,  appreciate assistance Underwent paracentesis on 11/18 with WBC more than 2000. Body fluid no growth to date.  Has significant degree of ascites.  I will order for paracentesis today.   Anemia with GI bleed: Due to known gastric and duodenal ulcer had EGD Oct 2020 which showed grade D esophagitis, 2cm hiatal hernia, 5cm non-bleeding gastric ulcer and non-bleeding duodenal ulcers. Continue with PPI  Acute renal failure in the setting of chronic kidney disease a stage III: Creatinine baseline 1.7--2.0 Renal ultrasound showed chronic kidney disease Nephrology consulted and following.  On TPN.  Creatinine improving, 2.25 today.  History of PTSD and schizoaffective disorder chronic pain Continue with haldol and cogentin.  Evaluated by Psych, patient does not have capacity to make medical decision.   DVT prophylaxis: SCD Code Status: Full code Family Communication: Not at bedside today. Disposition Plan: palliative care meeting 11-24. Patient is poor prognosis. I recommend comfort care and DNR.   Still remains full code.  Palliative care following.  Noted a plan to talk to his friends after the weekend.  He does not have any family in the [place].  Consultants:  General surgery GI Palliative care  Procedures:   US paracentesis 02/14/2019  Antimicrobials: Anti-infectives (From admission, onward)   Start     Dose/Rate Route Frequency Ordered Stop   02/08/19 2200  piperacillin-tazobactam (ZOSYN) IVPB 3.375 g     3.375 g 12.5 mL/hr over 240 Minutes Intravenous Every 8 hours 02/08/19 1552     02/08/19 1545  piperacillin-tazobactam (ZOSYN) IVPB 3.375 g     3.375 g 100 mL/hr over 30 Minutes Intravenous  Once 02/08/19 1533 02/08/19 1840      Diet Order            Diet NPO time specified Except for: Sips with Meds  Diet effective now              Infusions:  . sodium chloride    . sodium chloride Stopped (02/12/19 1737)  . sodium chloride Stopped (02/19/19 1422)  . dextrose 50 mL/hr at 02/24/19 0834  . piperacillin-tazobactam (ZOSYN)  IV 3.375 g (02/24/19 1031)  . dextrose 10 % with additives Pediatric IV fluid Stopped (02/21/19 2046)  . sodium phosphate   Dextrose 5% IVPB 10 mmol (02/24/19 1020)  . TPN ADULT (ION) Stopped (02/24/19 6384)  . TPN ADULT (ION)      Scheduled Meds: . benztropine mesylate  0.5 mg Intramuscular Daily  . Chlorhexidine Gluconate Cloth  6 each Topical Daily  . haloperidol  2 mg Oral Daily  . hydrocortisone sod succinate (SOLU-CORTEF) inj  25 mg Intravenous Q12H  . insulin aspart  0-9 Units Subcutaneous Q4H  . midodrine  10 mg Oral TID WC  . Muscle Rub   Topical BID  . OLANZapine zydis  2.5 mg Oral QHS  . pantoprazole (PROTONIX) IV  40 mg Intravenous Q12H  . sodium chloride flush  10-40 mL Intracatheter Q12H    PRN meds: Place/Maintain arterial line **AND** sodium chloride, sodium chloride, fentaNYL (SUBLIMAZE) injection, haloperidol lactate, lidocaine (PF), lidocaine, ondansetron (ZOFRAN) IV, sodium chloride flush, sodium chloride flush, sodium chloride flush   Objective: Vitals:   02/24/19 0632 02/24/19 1454  BP: (!) 148/85 133/67  Pulse: 88 84  Resp: 18 16  Temp: 98 F (36.7 C) 97.7 F (36.5 C)  SpO2: 97% 99%    Intake/Output Summary (Last 24 hours) at 02/24/2019 1514 Last data filed at 02/24/2019 0942 Gross per 24 hour  Intake 908.59 ml  Output -  Net 908.59 ml   Filed Weights   02/12/19 0136 02/19/19 0500 02/23/19 0500  Weight: 109.6 kg 114.2 kg 114.2 kg   Weight change:  Body mass index is 29.09 kg/m.   Physical Exam: General exam: Lying on bed.  Alert, awake, not in physical distress at the time of my evaluation Skin: No rashes, lesions or ulcers. HEENT: Atraumatic, normocephalic, supple neck, no obvious bleeding Lungs: Clear to auscultate bilaterally CVS: Regular rate and rhythm, no murmur GI/Abd: distended abdomen from ascites, nontender CNS: Alert, awake, knows he is in the hospital, can tell his date of birth.  No other appropriate response. Psychiatry: Depressed mood Extremities: Trace bilateral pedal edema bilaterally  Data Review: I have personally reviewed the laboratory  data and studies available.  Recent Labs  Lab 02/18/19 0324 02/19/19 0556 02/20/19 0807 02/23/19 1214  WBC 31.7* 29.1* 32.2* 25.0*  NEUTROABS  --  25.9*  --  22.4*  HGB 10.2* 11.0* 10.9* 10.2*  HCT 31.5* 33.9* 33.9* 31.5*  MCV 91.0 91.1 93.4 93.5  PLT 243 277 257 171   Recent Labs  Lab 02/19/19 0556 02/20/19 0807 02/21/19 1128 02/22/19 0444 02/23/19 1214 02/24/19 0313  NA 135 135 139 137 140 141  K 3.3* 3.7 3.4* 3.4* 3.9 4.1  CL 102 106 106 104 108 107  CO2 20* 16* 20* 22 24 26   GLUCOSE 115* 149* 113* 141* 149* 135*  BUN 52* 55* 58* 55* 63* 64*  CREATININE 3.04* 3.12* 3.09* 2.90* 2.50* 2.25*  CALCIUM 9.8 9.8 10.3 9.9 10.4* 10.7*  MG 2.3  --  2.3 2.0 1.9 1.8  PHOS 4.5  --  3.9 3.5 2.6 2.3*    Terrilee Croak, MD  [facility] 02/24/2019

## 2019-02-24 NOTE — Progress Notes (Signed)
PHARMACY - ADULT TOTAL PARENTERAL NUTRITION CONSULT NOTE   Pharmacy Consult for TPN Indication: Stomach perforation on bowel rest  Patient Measurements: Height: 6\' 6"  (198.1 cm) Weight: 251 lb 12.3 oz (114.2 kg) IBW/kg (Calculated) : 91.4 TPN AdjBW (KG): 107.4 Body mass index is 29.09 kg/m. Usual Weight: ~100kg  Assessment: 58 y/o [place] male  presenting 11/14 with abdominal pain/distention and rectal bleeding, found with bowel perforation tx with medical management and bowel rest.  Paracentesis 11/13. Started liquids on 11/18-19 now back on bowel rest and pharmacy consulted to start TPN.  Little PO intake since admission and prealbumin <5.    GI: worsened CT 11/21 pneumoperitoneum - gas between liver/duodenum. Perforation of distal stomach, GIB. Poor surgical candidate. Umbilical hernia note-stable. Rectal tube with LBM 11/27. Prealbumin <5. - IV PPI q 12 hrs Endo: A1c 3.9: CBGs WNL - also on Solucortef for adrenal insuff. (Prednisone PTA) Insulin requirements in the past 24 hours:  1 units last 24h  - on sens SSI Lytes: K 4.1, Mag 1.8 (will replace), Phos 2.3 (will replace) Renal: CKD3> SCr 3.04>>3.12>2.9>2.25 (BL 1.7-2), UOP x 3  Pulm: RA Cards: VSS - midodrine TID Hepatobil: Hx cirrhosis Child Pugh C/ascites. Paracentesis.  Tbili 2.3>1.8>1.5 down, AST/ALT wnl, TG wnl 100 Neuro: Schizoaffective disorder, chronic pain on Bentropine IM daily, Intermittently agitated. ID: Zosyn for peritonitis 11/13>>  . Afebrile. WBC 29.1>>32.2>>25. Adjust Zosyn dose as renal function worsens.   TPN Access: PICC 11/25  TPN start date: 11/22 Nutritional Goals (per RD recommendation on 11/23): KCal: 7026-3785 Protein: 125-145g Fluid: per MD Goal TPN rate is 85 ml/hr  Current Nutrition:  NPO  Plan:  Continue goal TPN at 61ml/hr This TPN provides 132 g of protein, 387 g of dextrose, and 61 g of lipids which provides 2460 kCals per day (100%) Electrolytes in TPN: Max acetate. Slight incr in  K, add back Mg and phos Phos, no Ca Add MVI, trace elements Continue sensitive SSI and adjust as needed Mag 2 gms IV x1 NaPhos 10 mmol IV x 1 TPN labs MonThurs and prn F/U continued goc discussions  Alanda Slim, PharmD, [facility] Clinical Pharmacist Please see AMION for all Pharmacists' Contact Phone Numbers 02/24/2019, 7:07 AM

## 2019-02-24 NOTE — Progress Notes (Signed)
Pt. pulled IV tubing out of TPN bag and caused the TPN the spill all over the floor. When asked why the pt. said that he wanted some attention and wanted someone in the room with him at all times.  Pt. has been calling out every 1-2 minutes during the night despite being given multiple PRN pain medication.  Pt. Also attempted to suck IV fluids out of his IV tubing last night. Pt. repeatedly messes with his PICC line and the IV pump despite being told by staff not to touch them.

## 2019-02-24 NOTE — Plan of Care (Signed)

## 2019-02-25 ENCOUNTER — Inpatient Hospital Stay (HOSPITAL_COMMUNITY): Payer: Medicaid Other

## 2019-02-25 LAB — GLUCOSE, CAPILLARY
Glucose-Capillary: 127 mg/dL — ABNORMAL HIGH (ref 70–99)
Glucose-Capillary: 134 mg/dL — ABNORMAL HIGH (ref 70–99)
Glucose-Capillary: 160 mg/dL — ABNORMAL HIGH (ref 70–99)
Glucose-Capillary: 149 mg/dL — ABNORMAL HIGH (ref 70–99)

## 2019-02-25 MED ORDER — TRACE MINERALS CU-MN-SE-ZN 300-55-60-3000 MCG/ML IV SOLN
INTRAVENOUS | Status: AC
  Administered 2019-02-25: 18:00:00 via INTRAVENOUS
  Filled 2019-02-25: qty 884

## 2019-02-25 MED ORDER — K PHOS MONO-SOD PHOS DI & MONO 155-852-130 MG PO TABS
250.0000 mg | ORAL_TABLET | Freq: Two times a day (BID) | ORAL | Status: AC
  Administered 2019-02-25 (×2): 250 mg via ORAL
  Filled 2019-02-25 (×2): qty 1

## 2019-02-25 MED ORDER — LIDOCAINE HCL (PF) 1 % IJ SOLN
INTRAMUSCULAR | Status: AC
  Filled 2019-02-25: qty 30

## 2019-02-25 NOTE — Progress Notes (Addendum)
PHARMACY - ADULT TOTAL PARENTERAL NUTRITION CONSULT NOTE   Pharmacy Consult for TPN Indication: Stomach perforation on bowel rest  Patient Measurements: Height: 6\' 6"  (198.1 cm) Weight: 251 lb 5.2 oz (114 kg) IBW/kg (Calculated) : 91.4 TPN AdjBW (KG): 107.4 Body mass index is 29.04 kg/m. Usual Weight: ~100kg  Assessment: 58 y/o [place] male  presenting 11/14 with abdominal pain/distention and rectal bleeding, found with bowel perforation tx with medical management and bowel rest.  Paracentesis 11/13. Started liquids on 11/18-19 now back on bowel rest and pharmacy consulted to start TPN.  Little PO intake since admission and prealbumin <5.    GI: worsened CT 11/21 pneumoperitoneum - gas between liver/duodenum. Perforation of distal stomach, GIB. Poor surgical candidate. Umbilical hernia note-stable. Rectal tube with LBM 11/27. Prealbumin <5. - IV PPI q 12 hrs Endo: A1c 3.9: CBGs WNL - also on Solucortef for adrenal insuff. (Prednisone PTA) - 11/28 started D10W @ 50 ml/hr Insulin requirements in the past 24 hours:  9 units last 24h  - on sens SSI Lytes: K 4.1, Mag 1.8 (will replace), Phos 2.3 (will replace) - no new labs 11/29 Renal: CKD3> SCr 3.04>>3.12>2.9>2.25 (BL 1.7-2), UOP x 3  Pulm: RA Cards: VSS - midodrine TID Hepatobil: Hx cirrhosis Child Pugh C/ascites. Paracentesis.  Tbili 2.3>1.8>1.5 down, AST/ALT wnl, TG wnl 100 Neuro: Schizoaffective disorder, chronic pain on Bentropine IM daily, Intermittently agitated. ID: Zosyn for peritonitis 11/13>>  . Afebrile. WBC 29.1>>32.2>>25. Adjust Zosyn dose as renal function worsens.   TPN Access: PICC 11/25 TPN start date: 11/22 Nutritional Goals (per RD recommendation on 11/23): KCal: 7741-2878 Protein: 125-145g Fluid: per MD Goal TPN rate is 85 ml/hr  Current Nutrition:  NPO 11/28 started D10W@50  ml/hr Plan to contact surgery this week regarding when he can start on CLD  Plan:  Continue goal TPN at 20ml/hr This TPN provides  132 g of protein, 387 g of dextrose, and 61 g of lipids which provides 2460 kCals per day (100%) Electrolytes in TPN: Max acetate. Slight incr in K, add back Mg and Phos, no Ca Add MVI, trace elements Continue sensitive SSI and adjust as needed TPN labs MonThurs and prn Watch insulin requirements due to starting D10W F/U continued goc discussions F/U CLD  Alanda Slim, PharmD, [facility] Clinical Pharmacist Please see AMION for all Pharmacists' Contact Phone Numbers 02/25/2019, 7:12 AM

## 2019-02-25 NOTE — Progress Notes (Signed)
Daily Progress Note   Patient Name: [name]       Date: 02/25/2019 DOB: 03/18/1961  Age: 58 y.o. MRN#: [id number] Attending Physician: Cipriano BunkerKumar, Pardeep, MD Primary Care Physician: Fleet ContrasAvbuere, Edwin, MD Admit Date: 02/08/2019  Reason for Consultation/Follow-up: Establishing goals of care  Subjective: Upon arrival to room, patient sleeping comfortably. No s/s of pain or distress.  Discussed with safety sitter at bedside. Delmar Landaudnan has been calm with her at bedside. She shares he can make his needs known such as repositioning and urinal. She denies that he has experienced hallucinations during her shift.   Discussed plan with bedside RN. Patients pain has been managed and no hallucinations that she is aware of. Plan is for paracentesis today.   Length of Stay: 17  Current Medications: Scheduled Meds:  . benztropine mesylate  0.5 mg Intramuscular Daily  . Chlorhexidine Gluconate Cloth  6 each Topical Daily  . haloperidol  2 mg Oral Daily  . hydrocortisone sod succinate (SOLU-CORTEF) inj  25 mg Intravenous Q12H  . insulin aspart  0-9 Units Subcutaneous Q4H  . midodrine  10 mg Oral TID WC  . Muscle Rub   Topical BID  . OLANZapine zydis  2.5 mg Oral QHS  . pantoprazole (PROTONIX) IV  40 mg Intravenous Q12H  . phosphorus  250 mg Oral BID  . sodium chloride flush  10-40 mL Intracatheter Q12H    Continuous Infusions: . sodium chloride    . sodium chloride Stopped (02/12/19 1737)  . sodium chloride Stopped (02/19/19 1422)  . dextrose 50 mL/hr at 02/24/19 1631  . piperacillin-tazobactam (ZOSYN)  IV 3.375 g (02/25/19 0906)  . dextrose 10 % with additives Pediatric IV fluid Stopped (02/21/19 2046)  . TPN ADULT (ION) 85 mL/hr at 02/24/19 1708  . TPN ADULT (ION)      PRN Meds: Place/Maintain  arterial line **AND** sodium chloride, sodium chloride, fentaNYL (SUBLIMAZE) injection, haloperidol lactate, lidocaine (PF), lidocaine, ondansetron (ZOFRAN) IV, sodium chloride flush, sodium chloride flush, sodium chloride flush  Physical Exam Vitals signs and nursing note reviewed.  Constitutional:      Appearance: He is ill-appearing.  HENT:     Head: Normocephalic and atraumatic.  Pulmonary:     Effort: No tachypnea, accessory muscle usage or respiratory distress.  Abdominal:     General: There is distension.  Tenderness: There is abdominal tenderness.     Comments: Plan for paracentesis today  Skin:    General: Skin is warm and dry.     Coloration: Skin is jaundiced.  Neurological:     Mental Status: He is easily aroused.     Comments: Sleeping during visit. Sitter/RN report oriented to person and can make needs known            Vital Signs: BP 134/87 (BP Location: Left Arm)   Pulse 87   Temp (!) 97.5 F (36.4 C) (Oral)   Resp 16   Ht 6\' 6"  (1.981 m)   Wt 114 kg   SpO2 100%   BMI 29.04 kg/m  SpO2: SpO2: 100 % O2 Device: O2 Device: Room Air O2 Flow Rate:    Intake/output summary:   Intake/Output Summary (Last 24 hours) at 02/25/2019 1041 Last data filed at 02/25/2019 02/27/2019 Gross per 24 hour  Intake 1156.13 ml  Output 400 ml  Net 756.13 ml   LBM: Last BM Date: 02/23/19 Baseline Weight: Weight: 99.8 kg Most recent weight: Weight: 114 kg       Palliative Assessment/Data: PPS 30%    Flowsheet Rows     Most Recent Value  Intake Tab  Referral Department  Hospitalist  Unit at Time of Referral  ER  Date Notified  02/09/19  Palliative Care Type  New Palliative care  Reason for referral  Clarify Goals of Care  Date of Admission  02/08/19  Date first seen by Palliative Care  02/11/19  # of days Palliative referral response time  2 Day(s)  # of days IP prior to Palliative referral  1  Clinical Assessment  Psychosocial & Spiritual Assessment  Palliative  Care Outcomes      Patient Active Problem List   Diagnosis Date Noted  . Acute renal failure with acute tubular necrosis superimposed on stage 3b chronic kidney disease (HCC)   . Palliative care encounter   . Palliative care by specialist   . AKI (acute kidney injury) (HCC)   . Goals of care, counseling/discussion   . Septic shock (HCC) 02/09/2019  . Intra-abdominal free air of unknown etiology   . Central venous catheter in place   . Abdominal pain 02/08/2019  . Bowel perforation (HCC) 02/08/2019  . Bleeding per rectum   . Acute gastric ulcer   . Duodenal ulcer   . Acute esophagitis   . Acute blood loss anemia   . Hematemesis 01/08/2019  . Prolonged QT interval 01/08/2019  . Esophageal varices in cirrhosis (HCC) 01/08/2019  . CKD (chronic kidney disease) stage 3, GFR 30-59 ml/min 01/08/2019  . Suicidal ideation 01/08/2019  . Closed displaced fracture of neck of left fifth metacarpal bone 01/08/2019  . GI bleed 08/07/2018  . Acute abdominal pain   . Cirrhosis (HCC)   . Generalized abdominal pain   . Hematemesis with nausea   . Evaluation by psychiatric service required   . Acute upper gastrointestinal bleeding 10/15/2017  . PTSD (post-traumatic stress disorder)   . Hypothyroidism   . History of adrenal insufficiency   . H/O diabetes insipidus   . Schizoaffective disorder (HCC) 11/08/2011  . Personality disorder (HCC)   . Normocytic anemia   . Ascites   . Liver disease   . Hepatitis C   . Kidney disease   . GERD (gastroesophageal reflux disease)     Palliative Care Assessment & Plan   Patient Profile: Palliative Care consult requested for this58 y.o.malewith multiple  medical problems including Child-Pugh class C cirrhosis,esophageal varices, HCV, splenomegaly, schizoaffective disorder, PTSD, history of GI bleed, neuropathy, and CKD, who was admitted to the hospital 02/08/2019 with abdominal pain and rectal bleeding.CT of abdomen and pelvis revealed  pneumoperitoneum from bowel perforation. Surgery was consulted but patient was not felt to be a surgical candidate due to the high risk from comorbidities. He has been treated conservatively. Palliative care was consulted to help address goals.  Assessment: Septic shock Bowel perforation/pneumonperitoneum/gastric perforation Decompensated liver cirrhosis Recurrent ascites Anemia with GIB ARF with CKD III Hx of depression, PTSD, schizoaffective disorder Hx of chronic pain  Recommendations/Plan:  Continue FULL code/FULL scope treatment.   Per previous palliative provider discussion with friends, they wished to discuss over the weekend and possibly re-meet Monday, 11/30. Voicemail left for friend, Daleen Snook to arrange time for [place] meeting when friends are available. Awaiting return call.   Continue psych medications. Per nursing staff, patient has not been hallucinating and doing well with safety sitter at bedside.   Plan for paracentesis today.  Goals of Care and Additional Recommendations:  Limitations on Scope of Treatment: Full Scope Treatment  Code Status: FULL   Code Status Orders  (From admission, onward)         Start     Ordered   02/08/19 1552  Full code  Continuous     02/08/19 1553        Code Status History    Date Active Date Inactive Code Status Order ID Comments User Context   01/08/2019 1118 01/11/2019 2224 Full Code [id number]  Norval Morton, MD ED   08/07/2018 0935 08/08/2018 1329 Full Code [id number]  Georgette Shell, MD ED   10/15/2017 0943 10/18/2017 1813 Full Code [id number]  Kipp Brood, MD ED   01/17/2013 1534 01/23/2013 1730 Full Code [id number]  Illene Labrador, PA-C ED   12/30/2011 0256 12/31/2011 1312 Full Code [id number]  Christa See, RN Inpatient   12/24/2011 1816 12/30/2011 0256 Full Code [id number]  Rhunette Croft, MD ED   12/23/2011 1522 12/24/2011 1107 Full Code [id number]  Virgel Manifold, MD ED   11/08/2011 0008 11/08/2011 2324 Full Code  [id number]  Janice Norrie, MD ED   Advance Care Planning Activity       Prognosis:   Poor prognosis  Discharge Planning:  To Be Determined  Care plan was discussed with RN, safety sitter, VM left for friend Daleen Snook) to arrange f/u [place] meeting.  Thank you for allowing the Palliative Medicine Team to assist in the care of this patient.   Time In: 1015 Time Out: 1045 Total Time 30 Prolonged Time Billed no      Greater than 50%  of this time was spent counseling and coordinating care related to the above assessment and plan.  Ihor Dow, DNP, FNP-C Palliative Medicine Team  Phone: [phone] Fax: [phone]  Please contact Palliative Medicine Team phone at [phone] for questions and concerns.

## 2019-02-25 NOTE — Procedures (Signed)
PROCEDURE SUMMARY:  Successful US guided paracentesis from RLQ.  Yielded 14 L of clear yellow fluid.  No immediate complications.  Pt tolerated well.   Specimen was not sent for labs.  EBL < 72mL  Ascencion Dike PA-C 02/25/2019 3:02 PM

## 2019-02-25 NOTE — Progress Notes (Signed)
PROGRESS NOTE    [name]  FBP:[id number] DOB: October 05, 1960 DOA: 02/08/2019 PCP: Nolene Ebbs, MD   Brief Narrative:  58 year old with past medical history significant for cirrhosis with splenomegaly, esophageal varices, hepatitis C, adrenal insufficiency, hypothyroidism, schizoaffective disorder, PTSD, GI bleed, neuropathy, chronic kidney disease. Patient presented to the emergency department on 11/12 due to worsening abdominal pain, distention associated with rectal bleeding for 1 day.  Work-up revealed bowel perforation and pneumoperitoneum.  Surgery consulted and recommended no operative intervention due to high risk of mortality. Started on antibiotics and bowel rest. GI was consulted for dark maroon stool and acute blood loss anemia. Protonix and octreotide infusion were initiated as patient has history of esophageal varices. Patient developed low blood pressure. CCM was consulted and patient was transferred to ICU. Patient was transferred back to triad service on 02/12/2019.  Patient underwent UGI on 11/16 and he was found to have focal contained perforation in the distal stomach. General surgery and GI recommended to keep NPO. No surgical intervention due to poor surgical candidate.Patient was treated with supportive care, subsequently he was a started on full liquid diet. Patient abdominal pain got worse, repeated CT abdomen showedlarge volume of free intraluminal fluid and pneumoperitoneum. Bowel perforation near pylorus or duodenum.Patient white count got worse. He is still getting IV antibiotics and IV Protonix. His kidney function continued to get worse. Beside aggressive care patient has not improved. Patient does not have POA or family here in the [place]. Palliative care meeting for goals of care with close friends. Patient was evaluated by psych he does not have capacity to make his own medical decisions.  Assessment & Plan:   Principal Problem:   Bowel  perforation (HCC) Active Problems:   Schizoaffective disorder (HCC)   PTSD (post-traumatic stress disorder)   History of adrenal insufficiency   GI bleed   Cirrhosis (HCC)   Esophageal varices in cirrhosis (HCC)   CKD (chronic kidney disease) stage 3, GFR 30-59 ml/min   Abdominal pain   Bleeding per rectum   Septic shock (HCC)   Central venous catheter in place   Goals of care, counseling/discussion   Palliative care by specialist   AKI (acute kidney injury) ([place])   Palliative care encounter   Acute renal failure with acute tubular necrosis superimposed on stage 3b chronic kidney disease ([place])  Septic Shock due to bowel perforation/pneumoperitoneum/gastric perforation: -Evaluated by CCS and deemed not to be a surgical candidate given high mortality. -11/16 endoscopy with no esophageal varices. -Patient was on vasopressors at one point during his stay in the ICU. -He received a couple of dose of albumin. -He is currently on midodrine. On IV Zosyn. -GI series showed gastric perforation with contained pneumoperitoneum. -Palliative consulted again for goals of care. -He is still complaining of abdominal pain. Hehad paracentesis yielding 5 L.  -Completed protonix gtt. On IV Protonix BID.  -WBC elevated to 32,000 on 11/24. CT abdomen 11-21showed large volume of free intraluminal fluid and pneumoperitoneum. Bowel perforation near pylorus or duodenum.  Surgery is recommending Bowel rest, TPN.  -On TPN.  Continue with medical management. Palliative care consult appreciated.  Patient does not have family in the [place].  Friends have not made a decision yet about his goals of care. -Patient has remained n.p.o. for several days now. We will start clear liquid diet after paracentesis and advance as tolerated.  History of adrenal insufficiency: He is on 50 mg of prednisone daily out patient.  Currently on IV hydrocortisone low dose.  Cirrhosis with ascites: Meld score 23 , Child Pugh  C; History of esophageal varices, HCV (treated with Epclusa in 2018, followed by liver clinic at [facility]). GI following, appreciate assistance Underwent paracentesis on 11/18 with WBC more than 2000. Body fluid no growth to date.  Has significant degree of ascites.  scheduled to have paracentesis today.  Anemia with GI bleed: Due to known gastric and duodenal ulcer had EGD Oct 2020 which showed grade D esophagitis, 2cm hiatal hernia, 5cm non-bleeding gastric ulcer and non-bleeding duodenal ulcers. Continue with PPI  Acute renal failure inthe setting of chronic kidney disease a stage III: Creatinine baseline 1.7--2.0 Renal ultrasound showed chronic kidney disease. Nephrology consultedand following.  On TPN.  Creatinine improving, 2.25 today.  History of PTSDand schizoaffective disorder chronic pain Continue with haldol and cogentin.  Evaluated by Psych, patient does not have capacity to make medical decision.  DVT prophylaxis: SCDS Code Status:  Full code. Family Communication:  None at bedside. Disposition Plan: palliative care meeting 11-24. Patient is poor prognosis. I recommend comfort care and DNR.  Still remains full code.  Palliative care following.  Noted a plan to talk to his friends after the weekend.  He does not have any family in the [place]. Consultants:    General Surgery  GI  Palliative care.  Procedures:  US paracentesis  Antimicrobials:  Anti-infectives (From admission, onward)   Start     Dose/Rate Route Frequency Ordered Stop   02/08/19 2200  piperacillin-tazobactam (ZOSYN) IVPB 3.375 g     3.375 g 12.5 mL/hr over 240 Minutes Intravenous Every 8 hours 02/08/19 1552     02/08/19 1545  piperacillin-tazobactam (ZOSYN) IVPB 3.375 g     3.375 g 100 mL/hr over 30 Minutes Intravenous  Once 02/08/19 1533 02/08/19 1840      Subjective: Patient was seen and examined at beside, states he is feeling very hungry, denies any SOB, scheduled to have  US paracentesis today.  Objective: Vitals:   02/25/19 1425 02/25/19 1430 02/25/19 1435 02/25/19 1440  BP: 117/71 129/75 106/71 118/74  Pulse:      Resp:      Temp:      TempSrc:      SpO2:      Weight:      Height:        Intake/Output Summary (Last 24 hours) at 02/25/2019 1647 Last data filed at 02/25/2019 1553 Gross per 24 hour  Intake 1363.17 ml  Output 625 ml  Net 738.17 ml   Filed Weights   02/19/19 0500 02/23/19 0500 02/25/19 0500  Weight: 114.2 kg 114.2 kg 114 kg    Examination:  General exam: Appears , awake, lying in bed comfortably, not in distress. Respiratory system: Clear to auscultation. Respiratory effort normal. Cardiovascular system: S1 & S2 heard, RRR. No JVD, murmurs, rubs, gallops or clicks. No pedal edema. Gastrointestinal system: Abdomen is distended, soft and  Mildly tender. No organomegaly or masses felt. Normal bowel sounds heard. Central nervous system: Alert and oriented. No focal neurological deficits. Extremities: trace bilateral pedal edema. Skin: No rashes, lesions or ulcers Psychiatry: Judgement and insight appear normal. Mood & affect appropriate.     Data Reviewed: I have personally reviewed following labs and imaging studies  CBC: Recent Labs  Lab 02/19/19 0556 02/20/19 0807 02/23/19 1214  WBC 29.1* 32.2* 25.0*  NEUTROABS 25.9*  --  22.4*  HGB 11.0* 10.9* 10.2*  HCT 33.9* 33.9* 31.5*  MCV 91.1 93.4 93.5  PLT 277 257  171   Basic Metabolic Panel: Recent Labs  Lab 02/19/19 0556 02/20/19 0807 02/21/19 1128 02/22/19 0444 02/23/19 1214 02/24/19 0313  NA 135 135 139 137 140 141  K 3.3* 3.7 3.4* 3.4* 3.9 4.1  CL 102 106 106 104 108 107  CO2 20* 16* 20* 22 24 26   GLUCOSE 115* 149* 113* 141* 149* 135*  BUN 52* 55* 58* 55* 63* 64*  CREATININE 3.04* 3.12* 3.09* 2.90* 2.50* 2.25*  CALCIUM 9.8 9.8 10.3 9.9 10.4* 10.7*  MG 2.3  --  2.3 2.0 1.9 1.8  PHOS 4.5  --  3.9 3.5 2.6 2.3*   GFR: Estimated Creatinine Clearance: 50.8  mL/min (A) (by C-G formula based on SCr of 2.25 mg/dL (H)). Liver Function Tests: Recent Labs  Lab 02/19/19 0556 02/21/19 1128 02/22/19 0444 02/23/19 1214  AST 16 18 20 16   ALT 12 15 15 14   ALKPHOS 67 64 63 63  BILITOT 1.8* 2.0* 1.5* 1.8*  PROT 6.3* 6.3* 5.9* 6.0*  ALBUMIN 2.6* 2.5* 2.2* 2.1*   No results for input(s): LIPASE, AMYLASE in the last 168 hours. Recent Labs  Lab 02/23/19 1219  AMMONIA 13   Coagulation Profile: No results for input(s): INR, PROTIME in the last 168 hours. Cardiac Enzymes: No results for input(s): CKTOTAL, CKMB, CKMBINDEX, TROPONINI in the last 168 hours. BNP (last 3 results) No results for input(s): PROBNP in the last 8760 hours. HbA1C: No results for input(s): HGBA1C in the last 72 hours. CBG: Recent Labs  Lab 02/24/19 2331 02/25/19 0402 02/25/19 0726 02/25/19 1149 02/25/19 1548  GLUCAP 125* 127* 134* 160* 149*   Lipid Profile: No results for input(s): CHOL, HDL, LDLCALC, TRIG, CHOLHDL, LDLDIRECT in the last 72 hours. Thyroid Function Tests: No results for input(s): TSH, T4TOTAL, FREET4, T3FREE, THYROIDAB in the last 72 hours. Anemia Panel: No results for input(s): VITAMINB12, FOLATE, FERRITIN, TIBC, IRON, RETICCTPCT in the last 72 hours. Sepsis Labs: No results for input(s): PROCALCITON, LATICACIDVEN in the last 168 hours.  No results found for this or any previous visit (from the past 240 hour(s)).    Radiology Studies: US Paracentesis  Result Date: 02/25/2019 INDICATION: Cirrhosis with recurrent ascites. Request for therapeutic paracentesis. EXAM: ULTRASOUND GUIDED RIGHT LOWER QUADRANT PARACENTESIS MEDICATIONS: None. COMPLICATIONS: None immediate. PROCEDURE: Informed written consent was obtained from the patient after a discussion of the risks, benefits and alternatives to treatment. A timeout was performed prior to the initiation of the procedure. Initial ultrasound scanning demonstrates a large amount of ascites within the right  lower abdominal quadrant. The right lower abdomen was prepped and draped in the usual sterile fashion. 1% lidocaine was used for local anesthesia. Following this, a 19 gauge, 7-cm, Yueh catheter was introduced. An ultrasound image was saved for documentation purposes. The paracentesis was performed. The catheter was removed and a dressing was applied. The patient tolerated the procedure well without immediate post procedural complication. FINDINGS: A total of approximately 14 L of clear yellow fluid was removed. IMPRESSION: Successful ultrasound-guided paracentesis yielding 14 liters of peritoneal fluid. Read by: Brayton El PA-C Electronically Signed   By: Irish Lack M.D.   On: 02/25/2019 15:01    Scheduled Meds:  benztropine mesylate  0.5 mg Intramuscular Daily   Chlorhexidine Gluconate Cloth  6 each Topical Daily   haloperidol  2 mg Oral Daily   hydrocortisone sod succinate (SOLU-CORTEF) inj  25 mg Intravenous Q12H   insulin aspart  0-9 Units Subcutaneous Q4H   midodrine  10 mg Oral TID  WC   Muscle Rub   Topical BID   OLANZapine zydis  2.5 mg Oral QHS   pantoprazole (PROTONIX) IV  40 mg Intravenous Q12H   phosphorus  250 mg Oral BID   sodium chloride flush  10-40 mL Intracatheter Q12H   Continuous Infusions:  sodium chloride     sodium chloride Stopped (02/12/19 1737)   sodium chloride Stopped (02/19/19 1422)   dextrose 50 mL/hr at 02/24/19 1631   piperacillin-tazobactam (ZOSYN)  IV 3.375 g (02/25/19 0906)   dextrose 10 % with additives Pediatric IV fluid Stopped (02/21/19 2046)   TPN ADULT (ION) 85 mL/hr at 02/24/19 1708   TPN ADULT (ION)       LOS: 17 days    Time spent: 25    Cipriano BunkerPARDEEP Kwana Ringel, MD [facility]   If 7PM-7AM, please contact night-coverage

## 2019-02-26 LAB — COMPREHENSIVE METABOLIC PANEL
ALT: 11 U/L (ref 0–44)
AST: 19 U/L (ref 15–41)
Albumin: 1.6 g/dL — ABNORMAL LOW (ref 3.5–5.0)
Alkaline Phosphatase: 61 U/L (ref 38–126)
Anion gap: 8 (ref 5–15)
BUN: 69 mg/dL — ABNORMAL HIGH (ref 6–20)
CO2: 29 mmol/L (ref 22–32)
Calcium: 10.3 mg/dL (ref 8.9–10.3)
Chloride: 100 mmol/L (ref 98–111)
Creatinine, Ser: 2.19 mg/dL — ABNORMAL HIGH (ref 0.61–1.24)
GFR calc Af Amer: 37 mL/min — ABNORMAL LOW (ref >60)
GFR calc non Af Amer: 32 mL/min — ABNORMAL LOW (ref >60)
Glucose, Bld: 139 mg/dL — ABNORMAL HIGH (ref 70–99)
Potassium: 4.3 mmol/L (ref 3.5–5.1)
Sodium: 137 mmol/L (ref 135–145)
Total Bilirubin: 1.6 mg/dL — ABNORMAL HIGH (ref 0.3–1.2)
Total Protein: 5.3 g/dL — ABNORMAL LOW (ref 6.5–8.1)

## 2019-02-26 LAB — CBC
HCT: 28.8 % — ABNORMAL LOW (ref 39.0–52.0)
Hemoglobin: 8.9 g/dL — ABNORMAL LOW (ref 13.0–17.0)
MCH: 30.2 pg (ref 26.0–34.0)
MCHC: 30.9 g/dL (ref 30.0–36.0)
MCV: 97.6 fL (ref 80.0–100.0)
Platelets: 99 10*3/uL — ABNORMAL LOW (ref 150–400)
RBC: 2.95 MIL/uL — ABNORMAL LOW (ref 4.22–5.81)
RDW: 19.9 % — ABNORMAL HIGH (ref 11.5–15.5)
WBC: 12.6 10*3/uL — ABNORMAL HIGH (ref 4.0–10.5)
nRBC: 0 % (ref 0.0–0.2)

## 2019-02-26 LAB — DIFFERENTIAL
Abs Immature Granulocytes: 0.08 10*3/uL — ABNORMAL HIGH (ref 0.00–0.07)
Basophils Absolute: 0 10*3/uL (ref 0.0–0.1)
Basophils Relative: 0 %
Eosinophils Absolute: 0 10*3/uL (ref 0.0–0.5)
Eosinophils Relative: 0 %
Immature Granulocytes: 1 %
Lymphocytes Relative: 7 %
Lymphs Abs: 0.8 10*3/uL (ref 0.7–4.0)
Monocytes Absolute: 0.9 10*3/uL (ref 0.1–1.0)
Monocytes Relative: 7 %
Neutro Abs: 10.8 10*3/uL — ABNORMAL HIGH (ref 1.7–7.7)
Neutrophils Relative %: 85 %

## 2019-02-26 LAB — GLUCOSE, CAPILLARY
Glucose-Capillary: 131 mg/dL — ABNORMAL HIGH (ref 70–99)
Glucose-Capillary: 146 mg/dL — ABNORMAL HIGH (ref 70–99)
Glucose-Capillary: 124 mg/dL — ABNORMAL HIGH (ref 70–99)
Glucose-Capillary: 137 mg/dL — ABNORMAL HIGH (ref 70–99)
Glucose-Capillary: 148 mg/dL — ABNORMAL HIGH (ref 70–99)
Glucose-Capillary: 158 mg/dL — ABNORMAL HIGH (ref 70–99)
Glucose-Capillary: 242 mg/dL — ABNORMAL HIGH (ref 70–99)

## 2019-02-26 LAB — MAGNESIUM: Magnesium: 1.9 mg/dL (ref 1.7–2.4)

## 2019-02-26 LAB — TRIGLYCERIDES: Triglycerides: 32 mg/dL (ref <150)

## 2019-02-26 LAB — PHOSPHORUS: Phosphorus: 2.9 mg/dL (ref 2.5–4.6)

## 2019-02-26 LAB — PREALBUMIN: Prealbumin: 5.2 mg/dL — ABNORMAL LOW (ref 18–38)

## 2019-02-26 MED ORDER — DEXTROSE 10 % IV SOLN
INTRAVENOUS | Status: DC
  Administered 2019-02-26 – 2019-02-27 (×2): via INTRAVENOUS

## 2019-02-26 MED ORDER — TRACE MINERALS CU-MN-SE-ZN 300-55-60-3000 MCG/ML IV SOLN
INTRAVENOUS | Status: AC
  Administered 2019-02-26: 17:00:00 via INTRAVENOUS
  Filled 2019-02-26: qty 864

## 2019-02-26 NOTE — Progress Notes (Addendum)
Daily Progress Note   Patient Name: [name]       Date: 02/26/2019 DOB: 1960-04-01  Age: 58 y.o. MRN#: [id number] Attending Physician: Shawna Clamp, MD Primary Care Physician: Nolene Ebbs, MD Admit Date: 02/08/2019  Reason for Consultation/Follow-up: Establishing goals of care  Subjective: Received call from [place]- patient's primary contact- she left a message that friends had met. I have called her back and left a message requesting a return call to discuss the outcome of their conversation.  Noted patient declined to work with PT today. Per sitter- he is mostly sleeping, did have breakfast of clear liquids- reportedly had some abdominal pain with this.  Had 14L off yesterday via paracentesis. On my eval [name] is lethargic. When asked how he is feeling, he replied, "good", then went back to sleep.  Review of Systems  Unable to perform ROS: Acuity of condition    Length of Stay: 18  Current Medications: Scheduled Meds:  . benztropine mesylate  0.5 mg Intramuscular Daily  . Chlorhexidine Gluconate Cloth  6 each Topical Daily  . haloperidol  2 mg Oral Daily  . hydrocortisone sod succinate (SOLU-CORTEF) inj  25 mg Intravenous Q12H  . insulin aspart  0-9 Units Subcutaneous Q4H  . midodrine  10 mg Oral TID WC  . Muscle Rub   Topical BID  . OLANZapine zydis  2.5 mg Oral QHS  . pantoprazole (PROTONIX) IV  40 mg Intravenous Q12H  . sodium chloride flush  10-40 mL Intracatheter Q12H    Continuous Infusions: . sodium chloride    . sodium chloride Stopped (02/12/19 1737)  . sodium chloride Stopped (02/19/19 1422)  . dextrose 50 mL/hr at 02/24/19 1631  . dextrose    . piperacillin-tazobactam (ZOSYN)  IV 3.375 g (02/26/19 0940)  . TPN ADULT (ION) 85 mL/hr at 02/25/19 1737  .  TPN ADULT (ION)      PRN Meds: Place/Maintain arterial line **AND** sodium chloride, sodium chloride, fentaNYL (SUBLIMAZE) injection, haloperidol lactate, lidocaine (PF), lidocaine, ondansetron (ZOFRAN) IV, sodium chloride flush, sodium chloride flush, sodium chloride flush  Physical Exam Vitals signs and nursing note reviewed.  Constitutional:      Appearance: He is ill-appearing.  Cardiovascular:     Rate and Rhythm: Normal rate.  Pulmonary:     Effort: Pulmonary effort is normal.  Skin:  Coloration: Skin is pale.  Neurological:     Mental Status: He is disoriented.             Vital Signs: BP 124/79 (BP Location: Left Arm)   Pulse 89   Temp 97.8 F (36.6 C) (Oral)   Resp 18   Ht '6\' 6"'$  (1.981 m)   Wt 114 kg   SpO2 98%   BMI 29.04 kg/m  SpO2: SpO2: 98 % O2 Device: O2 Device: Room Air O2 Flow Rate:    Intake/output summary:   Intake/Output Summary (Last 24 hours) at 02/26/2019 1340 Last data filed at 02/26/2019 1143 Gross per 24 hour  Intake 4273.6 ml  Output 250 ml  Net 4023.6 ml   LBM: Last BM Date: 02/25/19 Baseline Weight: Weight: 99.8 kg Most recent weight: Weight: 114 kg       Palliative Assessment/Data: PPS: 20%    Flowsheet Rows     Most Recent Value  Intake Tab  Referral Department  Hospitalist  Unit at Time of Referral  ER  Date Notified  02/09/19  Palliative Care Type  New Palliative care  Reason for referral  Clarify Goals of Care  Date of Admission  02/08/19  Date first seen by Palliative Care  02/11/19  # of days Palliative referral response time  2 Day(s)  # of days IP prior to Palliative referral  1  Clinical Assessment  Psychosocial & Spiritual Assessment  Palliative Care Outcomes      Patient Active Problem List   Diagnosis Date Noted  . Acute renal failure with acute tubular necrosis superimposed on stage 3b chronic kidney disease ([place])   . Palliative care encounter   . Palliative care by specialist   . AKI (acute kidney  injury) ([place])   . Goals of care, counseling/discussion   . Septic shock ([place]) 02/09/2019  . Pneumoperitoneum   . Central venous catheter in place   . Abdominal pain 02/08/2019  . Bowel perforation ([place]) 02/08/2019  . Bleeding per rectum   . Acute gastric ulcer   . Duodenal ulcer   . Acute esophagitis   . Acute blood loss anemia   . Hematemesis 01/08/2019  . Prolonged QT interval 01/08/2019  . Esophageal varices in cirrhosis ([place]) 01/08/2019  . CKD (chronic kidney disease) stage 3, GFR 30-59 ml/min 01/08/2019  . Suicidal ideation 01/08/2019  . Closed displaced fracture of neck of left fifth metacarpal bone 01/08/2019  . GI bleed 08/07/2018  . Acute abdominal pain   . Cirrhosis ([place])   . Generalized abdominal pain   . Hematemesis with nausea   . Evaluation by psychiatric service required   . Acute upper gastrointestinal bleeding 10/15/2017  . PTSD (post-traumatic stress disorder)   . Hypothyroidism   . History of adrenal insufficiency   . H/O diabetes insipidus   . Schizoaffective disorder ([place]) 11/08/2011  . Personality disorder ([place])   . Normocytic anemia   . Ascites   . Liver disease   . Hepatitis C   . Kidney disease   . 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 Surgery was consulted but patient was not felt to be a  surgical candidate due to the high risk from comorbidities. He has been treated conservatively. Palliative care was consulted to help address goals.   Assessment/Recommendations/Plan   Awaiting return  call from [place]. She left message stating noone was available for in person [place] meeting until Thursday- however, they had met over the weekend and she is able to discuss by phone  Continue current care  Addendum- received call from [place]- they have decided on DNR- but wish to continue all other medical interventions-   Daleen Snook would like to meet again with PMT on Thursday at 53- she and other support members are hopeful that patient's cognition will return with stabilization of psych medications and he can make his own decisions regarding comfort care- however, she states that for now she wishes to continue with what his last request was to her- and that was to continue medical care  Code Status:  Full code  Prognosis:   Unable to determine  Discharge Planning:  To Be Determined   Thank you for allowing the Palliative Medicine Team to assist in the care of this patient.   Time In: 1310 Time Out: 1345 Total Time 35 mins Prolonged Time Billed no      Greater than 50%  of this time was spent counseling and coordinating care related to the above assessment and plan.  Mariana Kaufman, AGNP-C Palliative Medicine   Please contact Palliative Medicine Team phone at [phone] for questions and concerns.

## 2019-02-26 NOTE — TOC Progression Note (Addendum)
Transition of Care [facility]) - Progression Note    Patient Details  Name: [name] MRN: [id number] Date of Birth: 19-Mar-1961  Transition of Care [facility] CM/SW [place], [place] Phone Number: 02/26/2019, 1:05 PM  Clinical Narrative:    [facility] team following; call received from [phone] she is also following.    Expected Discharge Plan: Skilled Nursing Facility Barriers to Discharge: Family Issues, Continued Medical Work up  Expected Discharge Plan and Services Expected Discharge Plan: [place] In-house Referral: Clinical Social Work Discharge Planning Services: CM Consult Living arrangements for the past 2 months: Apartment   Social Determinants of Health (SDOH) Interventions    Readmission Risk Interventions Readmission Risk Prevention Plan 01/11/2019  Transportation Screening Complete  Medication Review [employer]) Complete  PCP or Specialist appointment within 3-5 days of discharge Complete  HRI or [place] Complete  SW Recovery Care/Counseling Consult Complete  [place] Not Applicable  Some recent data might be hidden

## 2019-02-26 NOTE — Progress Notes (Signed)
Occupational Therapy Treatment Patient Details Name: [name] MRN: [id number] DOB: 04-May-1960 Today's Date: 02/26/2019    History of present illness Pt is a 58 y/o male admitted secondary to abdominal pain, distention and rectal bleeding. Pt found to have a bowel perforation. Per surgical team, no surgery indicated as pt is a "poor surgical candidate". Paracentesis completed on 11/18 with 5L removed. PMH including but not limited to cirrhosis, hep C, schizoaffective disorder, PTSD and CKD.   OT comments  Limited session d/t cognitive deficits and limited engagement. Pt required MAX A +2 for all bed mobility. Once pt positioned EOB pt immediatly became upset stating, "lay me back down." Pt unable to follow commands or engage purposefully in session. Continue to recommend SNF level therapies, will continue to follow acutely per POC.  Follow Up Recommendations  SNF;Supervision/Assistance - 24 hour    Equipment Recommendations  None recommended by OT    Recommendations for Other Services      Precautions / Restrictions Precautions Precautions: Fall Restrictions Weight Bearing Restrictions: No       Mobility Bed Mobility Overal bed mobility: Needs Assistance Bed Mobility: Supine to Sit;Sit to Supine Rolling: Max assist;+2 for physical assistance   Supine to sit: Max assist;+2 for physical assistance Sit to supine: Max assist;+2 for physical assistance   General bed mobility comments: max A to initiate LEs to EOB with max truncal assist to pull up to sitting. Pt kept reaching for therapist arms and stating "help me" but then immediately became upset upon sitting EOB as the pt reports he wanted to move the bed and not move himself  Transfers                 General transfer comment: unable today due to pt refusal    Balance Overall balance assessment: Needs assistance Sitting-balance support: Feet supported;Single extremity supported Sitting balance-Leahy Scale: Fair        Standing balance-Leahy Scale: Zero Standing balance comment: unable                           ADL either performed or assessed with clinical judgement   ADL Overall ADL's : Needs assistance/impaired                                     Functional mobility during ADLs: Maximal assistance;+2 for physical assistance;+2 for safety/equipment(bed mobility) General ADL Comments: total A for BADL at this time     Vision Patient Visual Report: No change from baseline     Perception     Praxis      Cognition Arousal/Alertness: Awake/alert Behavior During Therapy: Flat affect;Restless Overall Cognitive Status: Difficult to assess Area of Impairment: Orientation;Memory;Following commands;Safety/judgement;Problem solving                 Orientation Level: Disoriented to;Situation;Time;Place   Memory: Decreased short-term memory Following Commands: Follows one step commands inconsistently(pt not following any commands) Safety/Judgement: Decreased awareness of safety;Decreased awareness of deficits   Problem Solving: Decreased initiation;Difficulty sequencing;Requires verbal cues;Requires tactile cues General Comments: pt perseverates on few topics, repeatedly stating "push me there" (indicating he wanted to move towards the door in the room) and adamately refusing to remove covers or don socks. Decreased engagement and command following        Exercises     Shoulder Instructions       General Comments  Pertinent Vitals/ Pain       Pain Assessment: Faces Faces Pain Scale: Hurts a little bit Pain Location: abdomen, generalized? Pain Descriptors / Indicators: Grimacing;Moaning;Guarding Pain Intervention(s): Monitored during session;Repositioned  Home Living                                          Prior Functioning/Environment              Frequency  Min 2X/week        Progress Toward Goals  OT  Goals(current goals can now be found in the care plan section)  Progress towards OT goals: Not progressing toward goals - comment(decreased engagement)  Acute Rehab OT Goals Patient Stated Goal: get up out of bed, go outside OT Goal Formulation: With patient Time For Goal Achievement: 03/01/19 Potential to Achieve Goals: [place] Discharge plan remains appropriate;Frequency remains appropriate    Co-evaluation    PT/OT/SLP Co-Evaluation/Treatment: Yes Reason for Co-Treatment: Complexity of the patient's impairments (multi-system involvement);Necessary to address cognition/behavior during functional activity;For patient/therapist safety;To address functional/ADL transfers PT goals addressed during session: Mobility/safety with mobility OT goals addressed during session: ADL's and self-care      AM-PAC OT "6 Clicks" Daily Activity     Outcome Measure   Help from another person eating meals?: A Lot Help from another person taking care of personal grooming?: A Lot Help from another person toileting, which includes using toliet, bedpan, or urinal?: Total Help from another person bathing (including washing, rinsing, drying)?: Total Help from another person to put on and taking off regular upper body clothing?: Total Help from another person to put on and taking off regular lower body clothing?: Total 6 Click Score: 8    End of Session    OT Visit Diagnosis: Muscle weakness (generalized) (M62.81);Pain;Other symptoms and signs involving cognitive function;Unsteadiness on feet (R26.81)   Activity Tolerance Other (comment)(session limited d/t confusion and inability to follow commands)   Patient Left     Nurse Communication          Time: 3846-6599 OT Time Calculation (min): 19 min  Charges: OT General Charges $OT Visit: 1 Visit  Lanier Clam., COTA/L Acute Rehabilitation Services [phone]    Ihor Gully 02/26/2019, 11:57 AM

## 2019-02-26 NOTE — Progress Notes (Signed)
PROGRESS NOTE    [name]  QQV:[id number] DOB: 03/04/1961 DOA: 02/08/2019 PCP: Nolene Ebbs, MD   Brief Narrative:  58 year old with past medical history significant for cirrhosis with splenomegaly, esophageal varices, hepatitis C, adrenal insufficiency, hypothyroidism, schizoaffective disorder, PTSD, GI bleed, neuropathy, chronic kidney disease. Patient presented to the emergency department on 11/12 due to worsening abdominal pain, distention associated with rectal bleeding for 1 day.  Work-up revealed bowel perforation and pneumoperitoneum.  Surgery consulted and recommended no operative intervention due to high risk of mortality. Started on antibiotics and bowel rest. GI was consulted for dark maroon stool and acute blood loss anemia. Protonix and octreotide infusion were initiated as patient has history of esophageal varices. Patient developed low blood pressure. CCM was consulted and patient was transferred to ICU. Patient was transferred back to triad service on 02/12/2019.  Patient underwent UGI on 11/16 and he was found to have focal contained perforation in the distal stomach. General surgery and GI recommended to keep NPO. No surgical intervention due to poor surgical candidate.Patient was treated with supportive care, subsequently he was a started on full liquid diet. Patient abdominal pain got worse, repeated CT abdomen showedlarge volume of free intraluminal fluid and pneumoperitoneum. Bowel perforation near pylorus or duodenum.Patient white count got worse. He is still getting IV antibiotics and IV Protonix. His kidney function continued to get worse. Beside aggressive care patient has not improved. Patient does not have POA or family here in the [place]. Palliative care meeting for goals of care with close friends. Patient was evaluated by psych he does not have capacity to make his own medical decisions.  Assessment & Plan:   Principal Problem:   Bowel  perforation (HCC) Active Problems:   Schizoaffective disorder (HCC)   PTSD (post-traumatic stress disorder)   History of adrenal insufficiency   GI bleed   Cirrhosis (HCC)   Esophageal varices in cirrhosis (HCC)   CKD (chronic kidney disease) stage 3, GFR 30-59 ml/min   Abdominal pain   Bleeding per rectum   Septic shock (HCC)   Pneumoperitoneum   Central venous catheter in place   Goals of care, counseling/discussion   Palliative care by specialist   AKI (acute kidney injury) ([place])   Palliative care encounter   Acute renal failure with acute tubular necrosis superimposed on stage 3b chronic kidney disease ([place])  Septic Shock due to bowel perforation/pneumoperitoneum/gastric perforation: -Evaluated by [facility] and deemed not to be a surgical candidate given high mortality. -11/16 endoscopy with no esophageal varices. -Patient was on vasopressors at one point during his stay in the ICU. -He received a couple of dose of albumin. -He is currently on midodrine. On IV Zosyn. -GI series showed gastric perforation with contained pneumoperitoneum. -Palliative consulted again for goals of care. -He is still complaining of abdominal pain. Hehad paracentesis yielding 5 L.  -Completed protonix gtt. On IV Protonix BID.  -WBC elevated to 32,000 on 11/24. CT abdomen 11-21showed large volume of free intraluminal fluid and pneumoperitoneum. Bowel perforation near pylorus or duodenum.  Surgery is recommending Bowel rest, TPN.  -On TPN.  Continue with medical management. Palliative care consult appreciated.  Patient does not have family in the [place].  Friends have not made a decision yet about his goals of care. -Patient has remained n.p.o. for several days now. We will start clear liquid diet after paracentesis and advance as tolerated. -palliative care contacted [facility] and they have decided DNR but wants all medical treatments to be continued.  History of  adrenal insufficiency: He is on 50 mg of  prednisone daily out patient.  Currently on IV hydrocortisone low dose.   Cirrhosis with ascites: Meld score 23 , Child Pugh C; History of esophageal varices, HCV (treated with Epclusa in 2018, followed by liver clinic at [facility]). GI following, appreciate assistance Underwent paracentesis on 11/18 with WBC more than 2000. Body fluid no growth to date.  Has significant degree of ascites.   Repeat paracentesis 14 L drained.11/29.  Anemia with GI bleed: Due to known gastric and duodenal ulcer had EGD Oct 2020 which showed grade D esophagitis, 2cm hiatal hernia, 5cm non-bleeding gastric ulcer and non-bleeding duodenal ulcers. Hb at baseline. Continue with PPI  Acute renal failure inthe setting of chronic kidney disease a stage III: Creatinine baseline 1.7--2.0 Renal ultrasound showed chronic kidney disease. Nephrology consultedand following.  On TPN.  Creatinine improving, 2.19 today.  History of PTSDand schizoaffective disorder chronic pain Continue with haldol and cogentin.  Evaluated by Psych, patient does not have capacity to make medical decision.  DVT prophylaxis: SCDS Code Status:  Full code. Family Communication:  None at bedside. Disposition Plan: palliative care meeting 11-30. Patient is poor prognosis. received call from [place]- they have decided on DNR- but wish to continue all other medical interventions  Palliative care following.  Noted a plan to talk to his friends after the weekend.  He does not have any family in the [place].  Consultants:    General Surgery  GI  Palliative care.  Procedures:  US paracentesis  Antimicrobials:  Anti-infectives (From admission, onward)   Start     Dose/Rate Route Frequency Ordered Stop   02/08/19 2200  piperacillin-tazobactam (ZOSYN) IVPB 3.375 g     3.375 g 12.5 mL/hr over 240 Minutes Intravenous Every 8 hours 02/08/19 1552     02/08/19 1545  piperacillin-tazobactam (ZOSYN) IVPB 3.375 g     3.375 g 100  mL/hr over 30 Minutes Intravenous  Once 02/08/19 1533 02/08/19 1840      Subjective: Patient was seen and examined at beside, He appears comfortable, reports mild abdominal pain, denies any SOB.  Objective: Vitals:   02/25/19 1956 02/26/19 0007 02/26/19 0607 02/26/19 1507  BP: 115/70 121/68 124/79 97/61  Pulse: 90 83 89 95  Resp: 18 18 18 16   Temp: 98.5 F (36.9 C) 98.2 F (36.8 C) 97.8 F (36.6 C) 98.4 F (36.9 C)  TempSrc: Oral Oral Oral Oral  SpO2: 97% 97% 98% 96%  Weight:      Height:        Intake/Output Summary (Last 24 hours) at 02/26/2019 1642 Last data filed at 02/26/2019 1143 Gross per 24 hour  Intake 2948.6 ml  Output 250 ml  Net 2698.6 ml   Filed Weights   02/19/19 0500 02/23/19 0500 02/25/19 0500  Weight: 114.2 kg 114.2 kg 114 kg    Examination:  General exam: Appears , awake, lying in bed comfortably, not in distress. Respiratory system: Clear to auscultation. Respiratory effort normal. Cardiovascular system: S1 & S2 heard, RRR. No JVD, murmurs, rubs, gallops or clicks. No pedal edema. Gastrointestinal system: Abdomen is mildly distended, soft and  Mildly tender. No organomegaly or masses felt. Normal bowel sounds heard. Central nervous system: Alert and oriented. No focal neurological deficits. Extremities: trace bilateral pedal edema. Skin: No rashes, lesions or ulcers Psychiatry: Judgement and insight appear normal. Mood & affect appropriate.     Data Reviewed: I have personally reviewed following labs and imaging studies  CBC: Recent  Labs  Lab 02/20/19 0807 02/23/19 1214 02/26/19 0400  WBC 32.2* 25.0* 12.6*  NEUTROABS  --  22.4* 10.8*  HGB 10.9* 10.2* 8.9*  HCT 33.9* 31.5* 28.8*  MCV 93.4 93.5 97.6  PLT 257 171 99*   Basic Metabolic Panel: Recent Labs  Lab 02/21/19 1128 02/22/19 0444 02/23/19 1214 02/24/19 0313 02/26/19 0400  NA 139 137 140 141 137  K 3.4* 3.4* 3.9 4.1 4.3  CL 106 104 108 107 100  CO2 20* 22 24 26 29     GLUCOSE 113* 141* 149* 135* 139*  BUN 58* 55* 63* 64* 69*  CREATININE 3.09* 2.90* 2.50* 2.25* 2.19*  CALCIUM 10.3 9.9 10.4* 10.7* 10.3  MG 2.3 2.0 1.9 1.8 1.9  PHOS 3.9 3.5 2.6 2.3* 2.9   GFR: Estimated Creatinine Clearance: 52.2 mL/min (A) (by C-G formula based on SCr of 2.19 mg/dL (H)). Liver Function Tests: Recent Labs  Lab 02/21/19 1128 02/22/19 0444 02/23/19 1214 02/26/19 0400  AST 18 20 16 19   ALT 15 15 14 11   ALKPHOS 64 63 63 61  BILITOT 2.0* 1.5* 1.8* 1.6*  PROT 6.3* 5.9* 6.0* 5.3*  ALBUMIN 2.5* 2.2* 2.1* 1.6*   No results for input(s): LIPASE, AMYLASE in the last 168 hours. Recent Labs  Lab 02/23/19 1219  AMMONIA 13   Coagulation Profile: No results for input(s): INR, PROTIME in the last 168 hours. Cardiac Enzymes: No results for input(s): CKTOTAL, CKMB, CKMBINDEX, TROPONINI in the last 168 hours. BNP (last 3 results) No results for input(s): PROBNP in the last 8760 hours. HbA1C: No results for input(s): HGBA1C in the last 72 hours. CBG: Recent Labs  Lab 02/25/19 1149 02/25/19 1548 02/25/19 1959 02/26/19 0004 02/26/19 0406  GLUCAP 160* 149* 131* 146* 124*   Lipid Profile: Recent Labs    02/26/19 0400  TRIG 32   Thyroid Function Tests: No results for input(s): TSH, T4TOTAL, FREET4, T3FREE, THYROIDAB in the last 72 hours. Anemia Panel: No results for input(s): VITAMINB12, FOLATE, FERRITIN, TIBC, IRON, RETICCTPCT in the last 72 hours. Sepsis Labs: No results for input(s): PROCALCITON, LATICACIDVEN in the last 168 hours.  No results found for this or any previous visit (from the past 240 hour(s)).    Radiology Studies: 02/28/19 Paracentesis  Result Date: 02/25/2019 INDICATION: Cirrhosis with recurrent ascites. Request for therapeutic paracentesis. EXAM: ULTRASOUND GUIDED RIGHT LOWER QUADRANT PARACENTESIS MEDICATIONS: None. COMPLICATIONS: None immediate. PROCEDURE: Informed written consent was obtained from the patient after a discussion of the  risks, benefits and alternatives to treatment. A timeout was performed prior to the initiation of the procedure. Initial ultrasound scanning demonstrates a large amount of ascites within the right lower abdominal quadrant. The right lower abdomen was prepped and draped in the usual sterile fashion. 1% lidocaine was used for local anesthesia. Following this, a 19 gauge, 7-cm, Yueh catheter was introduced. An ultrasound image was saved for documentation purposes. The paracentesis was performed. The catheter was removed and a dressing was applied. The patient tolerated the procedure well without immediate post procedural complication. FINDINGS: A total of approximately 14 L of clear yellow fluid was removed. IMPRESSION: Successful ultrasound-guided paracentesis yielding 14 liters of peritoneal fluid. Read by: 02/28/19 PA-C Electronically Signed   By: [place] M.D.   On: 02/25/2019 15:01    Scheduled Meds:  benztropine mesylate  0.5 mg Intramuscular Daily   Chlorhexidine Gluconate Cloth  6 each Topical Daily   haloperidol  2 mg Oral Daily   hydrocortisone sod succinate (SOLU-CORTEF) inj  25 mg Intravenous Q12H   insulin aspart  0-9 Units Subcutaneous Q4H   midodrine  10 mg Oral TID WC   Muscle Rub   Topical BID   OLANZapine zydis  2.5 mg Oral QHS   pantoprazole (PROTONIX) IV  40 mg Intravenous Q12H   sodium chloride flush  10-40 mL Intracatheter Q12H   Continuous Infusions:  sodium chloride     sodium chloride Stopped (02/12/19 1737)   sodium chloride Stopped (02/19/19 1422)   dextrose 50 mL/hr at 02/24/19 1631   dextrose     piperacillin-tazobactam (ZOSYN)  IV 3.375 g (02/26/19 0940)   TPN ADULT (ION) 85 mL/hr at 02/25/19 1737   TPN ADULT (ION)       LOS: 18 days    Time spent: 25    Cipriano BunkerPARDEEP Daphney Hopke, MD [facility]   If 7PM-7AM, please contact night-coverage

## 2019-02-26 NOTE — Progress Notes (Signed)
Physical Therapy Treatment Patient Details Name: [name] MRN: [id number] DOB: 02-Mar-1961 Today's Date: 02/26/2019    History of Present Illness Pt is a 58 y/o male admitted secondary to abdominal pain, distention and rectal bleeding. Pt found to have a bowel perforation. Per surgical team, no surgery indicated as pt is a "poor surgical candidate". Paracentesis completed on 11/18 with 5L removed. PMH including but not limited to cirrhosis, hep C, schizoaffective disorder, PTSD and CKD.    PT Comments    Pt in bed with sitter present upon PT/OT arrival, initially agreeable to working with PT/OT as he expressed interest in going outside and moving in the room. However, pt required max verbal encouragement and motivation of PT/OT, and still refused to lower covers or don socks to allow for mobility. Upon sitting EOB (maxA of 2 despite pt initiation with BUE) the pt became visibly upset and accused the staff of lying as he then clarified he wanted just to push the bed around in room and hallway to mobilize. PT/OT educated on importance of mobility, and offered transfer to recliner to mobilize more easily in room, but the pt refused. Pt will continue to follow and attempt to progress independence with bed mobility and transfers.    Follow Up Recommendations  SNF;Supervision/Assistance - 24 hour     Equipment Recommendations  Hospital bed    Recommendations for Other Services       Precautions / Restrictions Precautions Precautions: Fall Restrictions Weight Bearing Restrictions: No    Mobility  Bed Mobility Overal bed mobility: Needs Assistance Bed Mobility: Supine to Sit;Sit to Supine Rolling: Max assist;+2 for physical assistance   Supine to sit: Max assist;+2 for physical assistance     General bed mobility comments: max A to initiate LEs to EOB with max truncal assist to pull up to sitting. Pt kept reaching for therapist arms and stating "help me" but then immediately became  upset upon sitting EOB as the pt reports he wanted to move the bed and not move himself  Transfers                 General transfer comment: unable today due to pt refusal  Ambulation/Gait             General Gait Details: unable   Stairs             Wheelchair Mobility    Modified Rankin (Stroke Patients Only)       Balance Overall balance assessment: Needs assistance Sitting-balance support: Feet supported;Single extremity supported Sitting balance-Leahy Scale: Fair       Standing balance-Leahy Scale: Zero Standing balance comment: unable                            Cognition Arousal/Alertness: Awake/alert Behavior During Therapy: Flat affect;Restless Overall Cognitive Status: Difficult to assess Area of Impairment: Orientation;Memory;Following commands;Safety/judgement                 Orientation Level: Disoriented to;Situation;Time;Place   Memory: Decreased short-term memory Following Commands: Follows one step commands inconsistently(pt not following commands during this session) Safety/Judgement: Decreased awareness of safety;Decreased awareness of deficits   Problem Solving: Decreased initiation;Difficulty sequencing;Requires verbal cues;Requires tactile cues General Comments: pt perseverates on few topics, repeatedly stating "push me there" (indicating he wanted to move towards the door in the room) and adamately refusing to remove covers or don socks. Decreased engagement and command following  Exercises      General Comments        Pertinent Vitals/Pain Pain Assessment: Faces Faces Pain Scale: Hurts a little bit Pain Location: abdomen, generalized? Pain Descriptors / Indicators: Grimacing;Moaning;Guarding Pain Intervention(s): Monitored during session;Repositioned    Home Living                      Prior Function            PT Goals (current goals can now be found in the care plan section)  Acute Rehab PT Goals Patient Stated Goal: get up out of bed, go outside PT Goal Formulation: With patient Time For Goal Achievement: 03/01/19 Potential to Achieve Goals: Fair Progress towards PT goals: Progressing toward goals    Frequency    Min 2X/week      PT Plan Current plan remains appropriate    Co-evaluation PT/OT/SLP Co-Evaluation/Treatment: Yes Reason for Co-Treatment: Necessary to address cognition/behavior during functional activity;To address functional/ADL transfers PT goals addressed during session: Mobility/safety with mobility        AM-PAC PT "6 Clicks" Mobility   Outcome Measure  Help needed turning from your back to your side while in a flat bed without using bedrails?: Total Help needed moving from lying on your back to sitting on the side of a flat bed without using bedrails?: Total Help needed moving to and from a bed to a chair (including a wheelchair)?: Total Help needed standing up from a chair using your arms (e.g., wheelchair or bedside chair)?: Total Help needed to walk in hospital room?: Total Help needed climbing 3-5 steps with a railing? : Total 6 Click Score: 6    End of Session Equipment Utilized During Treatment: Gait belt Activity Tolerance: Treatment limited secondary to agitation Patient left: in bed;with call bell/phone within reach;with bed alarm set Nurse Communication: Mobility status;Need for lift equipment PT Visit Diagnosis: Other abnormalities of gait and mobility (R26.89) Pain - part of body: (abdomen)     Time: 2353-6144 PT Time Calculation (min) (ACUTE ONLY): 19 min  Charges:  $Therapeutic Activity: 8-22 mins                    Mickey Farber, PT, DPT   Acute Rehabilitation Department [phone]   Otho Bellows 02/26/2019, 11:43 AM

## 2019-02-26 NOTE — Progress Notes (Signed)
PHARMACY - ADULT TOTAL PARENTERAL NUTRITION CONSULT NOTE  Pharmacy Consult for TPN Indication: Stomach perforation on bowel rest  Patient Measurements: Height: 6\' 6"  (198.1 cm) Weight: 251 lb 5.2 oz (114 kg) IBW/kg (Calculated) : 91.4 TPN AdjBW (KG): 107.4 Body mass index is 29.04 kg/m. Usual Weight: ~100kg  Assessment:  44 YOM presenting 11/14 with abdominal pain/distention and rectal bleeding, found with bowel perforation tx with medical management and bowel rest.  Paracentesis 11/13. Started liquids on 11/18-19 now back on bowel rest and pharmacy consulted to start TPN.  Little PO intake since admission and prealbumin <5.    GI: worsened CT 11/21 pneumoperitoneum - gas between liver/duodenum. Perforation of distal stomach, GIB. Poor surgical candidate. Umbilical hernia note-stable.   Prealbumin up to 5.2, BM x3.  PPI IV BID Endo: A1c 3.9% - CBGs well controlled.  Solucortef for adrenal insuff (prednisone PTA).  11/28 started D10W at 50 ml/hr Insulin requirements in the past 24 hours: 5 units sSSI  Lytes: all WNL except CoCa 12.22 (no calcium in TPN) Renal: CKD3 - SCr down 2.19 (BL 1.7-2), BUN 69 - UOP 0.2 ml/kg/hr, net +13.2L Pulm: stable on RA Cards: VSS - midodrine TID Hepatobil: hx cirrhosis Child Pugh C.  Ascites s/p paracentesis 11/29, 14L removed. LFTs WNL, tbili 1.6, TG WNL (ILE 20% of kCal to provide more CHO) Neuro: schizoaffective disorder, chronic pain - benztropine, Haldol, Zyprexa, PRN Fentanyl/Haldol ID: Zosyn for peritonitis (11/13 >> ) - afebrile, WBC 12.6 TPN Access: PICC 02/21/19 TPN start date: 02/18/19  Nutritional Goals (per RD rec on 11/23): 2450-2650 kCal, 125-145gm protein per day  Current Nutrition:  TPN Clear liquid diet  Plan:  Adjust concentrated TPN to provide more CHO TPN at 75 ml/hr will provide 130g AA, 450g CHO and 52g ILE for a total of 2552 kCal, meeting 100% of patients needs Electrolytes in TPN: increase Phos slightly, no Ca, max  acetate Daily multivitamin and trace elements in TPN Continue sensitive SSI.  D/C if CBGs remain controlled in a couple of days. Reduce D10W to 20 ml/hr when new TPN bag starts - discussed with MD.  Stop in AM if CBGs acceptable. F/U continued [place] discussions, repeat labs on Wed  Nashika Coker D. Mina Marble, PharmD, BCPS, [place] 02/26/2019, 8:42 AM

## 2019-02-27 DIAGNOSIS — K668 Other specified disorders of peritoneum: Secondary | ICD-10-CM

## 2019-02-27 LAB — COMPREHENSIVE METABOLIC PANEL
ALT: 19 U/L (ref 0–44)
AST: 32 U/L (ref 15–41)
Albumin: 1.6 g/dL — ABNORMAL LOW (ref 3.5–5.0)
Alkaline Phosphatase: 63 U/L (ref 38–126)
Anion gap: 6 (ref 5–15)
BUN: 68 mg/dL — ABNORMAL HIGH (ref 6–20)
CO2: 28 mmol/L (ref 22–32)
Calcium: 10 mg/dL (ref 8.9–10.3)
Chloride: 97 mmol/L — ABNORMAL LOW (ref 98–111)
Creatinine, Ser: 2.03 mg/dL — ABNORMAL HIGH (ref 0.61–1.24)
GFR calc Af Amer: 41 mL/min — ABNORMAL LOW (ref >60)
GFR calc non Af Amer: 35 mL/min — ABNORMAL LOW (ref >60)
Glucose, Bld: 127 mg/dL — ABNORMAL HIGH (ref 70–99)
Potassium: 4.2 mmol/L (ref 3.5–5.1)
Sodium: 131 mmol/L — ABNORMAL LOW (ref 135–145)
Total Bilirubin: 1.5 mg/dL — ABNORMAL HIGH (ref 0.3–1.2)
Total Protein: 5.7 g/dL — ABNORMAL LOW (ref 6.5–8.1)

## 2019-02-27 LAB — CBC
HCT: 30.1 % — ABNORMAL LOW (ref 39.0–52.0)
Hemoglobin: 9.5 g/dL — ABNORMAL LOW (ref 13.0–17.0)
MCH: 30.4 pg (ref 26.0–34.0)
MCHC: 31.6 g/dL (ref 30.0–36.0)
MCV: 96.2 fL (ref 80.0–100.0)
Platelets: 98 10*3/uL — ABNORMAL LOW (ref 150–400)
RBC: 3.13 MIL/uL — ABNORMAL LOW (ref 4.22–5.81)
RDW: 19.5 % — ABNORMAL HIGH (ref 11.5–15.5)
WBC: 16 10*3/uL — ABNORMAL HIGH (ref 4.0–10.5)
nRBC: 0 % (ref 0.0–0.2)

## 2019-02-27 LAB — GLUCOSE, CAPILLARY
Glucose-Capillary: 123 mg/dL — ABNORMAL HIGH (ref 70–99)
Glucose-Capillary: 133 mg/dL — ABNORMAL HIGH (ref 70–99)
Glucose-Capillary: 136 mg/dL — ABNORMAL HIGH (ref 70–99)
Glucose-Capillary: 160 mg/dL — ABNORMAL HIGH (ref 70–99)
Glucose-Capillary: 167 mg/dL — ABNORMAL HIGH (ref 70–99)
Glucose-Capillary: 173 mg/dL — ABNORMAL HIGH (ref 70–99)
Glucose-Capillary: 250 mg/dL — ABNORMAL HIGH (ref 70–99)

## 2019-02-27 MED ORDER — TRACE MINERALS CU-MN-SE-ZN 300-55-60-3000 MCG/ML IV SOLN
INTRAVENOUS | Status: AC
  Administered 2019-02-27: 18:00:00 via INTRAVENOUS
  Filled 2019-02-27: qty 870.4

## 2019-02-27 NOTE — Progress Notes (Signed)
Nutrition Follow-up  DOCUMENTATION CODES:   Not applicable  INTERVENTION:   -TPN management per pharmacy  NUTRITION DIAGNOSIS:   Increased nutrient needs related to chronic illness(cirrhosis) as evidenced by estimated needs.  Ongoing  GOAL:   Patient will meet greater than or equal to 90% of their needs  Met with TPN  MONITOR:   Skin, Weight trends, Labs, I & O's, Other (Comment)(TPN)  REASON FOR ASSESSMENT:   Consult New TPN/TNA  ASSESSMENT:   58 y.o. male with medical history significant of cirrhosis with splenomegaly, esophageal varices, hepatitis C, adrenal insufficiency, hypothyroidism, schizoaffective disorder, PTSD, GI bleed, neuropathy, chronic kidney disease presented to emergency department due to worsening abdominal pain, distention associated with rectal bleeding for 1 day.  Work-up revealed bowel perforation/pneumoperitoneum.  11/13- s/p paracentesis (removed 2860 ml fluid) 11/17- PICC placed 11/18- s/p LLQ paracentesis (5 L clear yellow fluid removed), advanced to clear liquid diet, pt pulled PICC line 11/21- CT revealed large volume pneumoperitoneum with gas between liver and duodenum 11/22- TPN re-started, PICC placed 11/25- PICC replaced by IR 11/29- s/p RLQ paracentesis (14 L clear yellow fluid removed), advanced to clear liquid diet   Reviewed I/O's: +3.5 L x 24 hours and +17.3 L since 02/13/19  UOP: 450 ml x 24 hours  Per MD notes, pt with poor prognosis and poor surgical candidate. Palliative care team following for further goals of care discussions.   Pt advanced to clear liquid diet on Sunday; noted meal completion 100%.   Pt remains dependent on TPN- receiving at 75 ml/hr, which provides 2552 kcals and 130 grams protein, meeting 100% of estimated kcals and protein.   Labs reviewed: Na: 131, CBGS: 123-250 (inpatient orders for glycemic control are 0-9 units insulin aspart every 4 hours).   Diet Order:   Diet Order            Diet clear  liquid Room service appropriate? Yes; Fluid consistency: Thin  Diet effective now              EDUCATION NEEDS:   Not appropriate for education at this time  Skin:  Skin Assessment: Skin Integrity Issues: Skin Integrity Issues:: Other (Comment) Other: MASD to groin  Last BM:  02/26/19  Height:   Ht Readings from Last 1 Encounters:  02/09/19 '6\' 6"'$  (1.981 m)    Weight:   Wt Readings from Last 1 Encounters:  02/27/19 110.5 kg    Ideal Body Weight:  97 kg  BMI:  Body mass index is 28.15 kg/m.  Estimated Nutritional Needs:   Kcal:  3299-2426  Protein:  125-145 grams  Fluid:  Per MD    Ruchi Stoney A. Jimmye Norman, RD, LDN, [place] Registered Dietitian II Certified Diabetes Care and Education Specialist Pager: [phone] After hours Pager: [phone]

## 2019-02-27 NOTE — Progress Notes (Signed)
PROGRESS NOTE    [name]  XIH:[id number] DOB: 05-16-1960 DOA: 02/08/2019 PCP: Fleet Contras, MD    Brief Narrative:  58 year old with past medical history significant for cirrhosis with splenomegaly, esophageal varices, hepatitis C, adrenal insufficiency, hypothyroidism, schizoaffective disorder, PTSD, GI bleed, neuropathy, chronic kidney disease. Patient presented to the emergency department on 11/12 due to worsening abdominal pain, distention associated with rectal bleeding for 1 day.  Work-up revealed bowel perforation and pneumoperitoneum.  Surgery consulted and recommended no operative intervention due to high risk of mortality. Started on antibiotics and bowel rest. GI was consulted for dark maroon stool and acute blood loss anemia. Protonix and octreotide infusion were initiated as patient has history of esophageal varices. Patient developed low blood pressure. CCM was consulted and patient was transferred to ICU. Patient was transferred back to triad service on 02/12/2019.  Patient underwent UGI on 11/16 and he was found to have focal contained perforation in the distal stomach. General surgery and GI recommended to keep NPO. No surgical intervention due to poor surgical candidate.Patient was treated with supportive care, subsequently he was a started on full liquid diet. Patient abdominal pain got worse, repeated CT abdomen showedlarge volume of free intraluminal fluid and pneumoperitoneum. Bowel perforation near pylorus or duodenum.Patient white count got worse. He is still getting IV antibiotics and IV Protonix. His kidney function continued to get worse. Beside aggressive care patient has not improved. Patient does not have POA or family here in the [place]. Palliative care meeting for goals of care with close friends. Patient was evaluated by psych he does not have capacity to make his own medical decisions.   Assessment & Plan:   Principal Problem:   Bowel  perforation (HCC) Active Problems:   Schizoaffective disorder (HCC)   PTSD (post-traumatic stress disorder)   History of adrenal insufficiency   GI bleed   Cirrhosis (HCC)   Esophageal varices in cirrhosis (HCC)   CKD (chronic kidney disease) stage 3, GFR 30-59 ml/min   Abdominal pain   Bleeding per rectum   Septic shock (HCC)   Pneumoperitoneum   Central venous catheter in place   Goals of care, counseling/discussion   Palliative care by specialist   AKI (acute kidney injury) (HCC)   Palliative care encounter   Acute renal failure with acute tubular necrosis superimposed on stage 3b chronic kidney disease (HCC)   1 severe sepsis with septic shock due to bowel perforation/pneumoperitoneum/gastric perforation Patient admitted with concerns for bowel perforation.  Patient seen by general surgery and deemed not to be a surgical candidate due to high mortality.  Patient noted to have required pressors during this hospitalization requiring ICU stay.  Patient given some doses of albumin due to history of cirrhosis and currently on midodrine.  Patient on empiric IV antibiotics with IV Zosyn. Patient underwent upper GI series which showed gastric perforation with contained pneumoperitoneum.  Patient noted to have some complaints of abdominal pain.  Patient status post paracentesis x2 with initial paracentesis yielding 5 L and second paracentesis yielding 14 L.  Patient was on IV PPI drip and has been transitioned to IV PPI twice daily.  Patient noted on 1124 to have a significant leukocytosis.  CT abdomen and pelvis done 02/17/2019 showed large volume of free intramural fluid and pneumoperitoneum.  Bowel perforation near pylorus or duodenum.  Bowel rest and TPN was been recommended per general surgery.  Patient started on TPN.  Palliative care consulted and patient noted not to have family in the [place]  but have friends that have not made a decision yet about his goals of care.  Patient started on clears  that he is tolerating.  Advance as tolerated.  Patient now with DNR but wants all medical treatment to be continued.  Palliative care following and appreciate the input and recommendations.  2.  History of adrenal insufficiency Patient noted to be on prednisone 50 mg daily in the outpatient setting.  Currently on IV hydrocortisone which we will continue for now.  3.  Cirrhosis with ascites/history of esophageal varices/HCV/meld score 23/child Pugh C Status post Epclusa in 2018 and being followed by the liver clinic at [facility]. Patient seen by GI during this hospitalization.  Status post paracentesis with greater than 2000 WBCs.  Body fluids with no growth to date.  Patient still with some degree of ascites.  Repeat paracentesis done with 14 L removed 02/25/2019.  Monitor.  Paracentesis as needed.  4.  Acute blood loss anemia/history of gastric and duodenal ulcer Patient noted per EGD of October 2020 to have grade D esophagitis, 5 cm nonbleeding gastric ulcer, nonbleeding duodenal ulcers, 2 cm hiatal hernia.  H&H stable.  Continue PPI.  5.  Acute renal failure in the setting of chronic kidney disease stage IIIb Baseline creatinine 1.7-2.   Questionable etiology.  Likely secondary to a prerenal azotemia as patient noted to be hypotensive with concerns for possible HRS.  Patient also noted to have received some contrast.  Renal ultrasound consistent with medical renal disease.  Patient initially started on HRS protocol with midodrine/octreotide and IV albumin with some improvement with blood pressures.  Patient with a urine output of 450 cc over the past 24 hours however urine output may not be properly recorded.  Renal function improving creatinine currently down to 2.03.  Continue midodrine.  Continue TPN.  Nephrology was following.  6.  History of PTSD/schizoaffective disorder/chronic pain Continue Haldol 2 mg daily.  Continue Cogentin.  Patient seen in consultation by psychiatry and patient  deemed not to have capacity to make medical decisions.   DVT prophylaxis: SCDs Code Status: DNR Family Communication: Updated patient.  Patient with no family in the [place]. Disposition Plan: To be determined.  Patient with poor prognosis.  Palliative care meeting 02/26/2019 decision to continue on medical interventions and remeet with patient's friends after the weekend as no family in the [place].   Consultants:   Psychiatry: Dr. Lucianne Muss 02/14/2019  Palliative care: Dr. Windy Fast, NP 02/11/2019  Nephrology: Dr. Arlean Hopping 02/10/2019  PCCM Dr. Vassie Loll 02/09/2019  Gastroenterology: Dr. Marina Goodell 02/09/2019  General surgery: Dr. Luisa Hart 02/08/2019  Procedures:   CT abdomen and pelvis of 02/08/2019, 02/17/2019  Chest x-ray 02/09/2019, 02/20/2019  Upper GI series with KUB 02/12/2019  Ultrasound-guided paracentesis 02/25/2019--14 L of peritoneal fluid removed by IR  Ultrasound-guided paracentesis with 5 L of clear yellow fluid removed per IR 02/14/2019  Renal ultrasound 02/10/2019  Ultrasound-guided and fluoroscopic guided power PICC line placement 02/21/2019 per IR  Antimicrobials:   IV Zosyn 02/08/2019>>>>   Subjective: Patient lying in bed.  Patient states had bowel movement today.  Tolerating clears.  Asking to eat a pizza.  Denies any chest pain.  No shortness of breath.  Objective: Vitals:   02/26/19 0607 02/26/19 1507 02/26/19 2302 02/27/19 0537  BP: 124/79 97/61 108/65   Pulse: 89 95 95   Resp: 18 16 17    Temp: 97.8 F (36.6 C) 98.4 F (36.9 C) 98.3 F (36.8 C)   TempSrc: Oral Oral Oral  SpO2: 98% 96% 99%   Weight:    110.5 kg  Height:        Intake/Output Summary (Last 24 hours) at 02/27/2019 1145 Last data filed at 02/27/2019 0700 Gross per 24 hour  Intake 2937.45 ml  Output 450 ml  Net 2487.45 ml   Filed Weights   02/23/19 0500 02/25/19 0500 02/27/19 0537  Weight: 114.2 kg 114 kg 110.5 kg    Examination:  General exam: Appears calm and comfortable    Respiratory system: Clear to auscultation anterior lung fields. Respiratory effort normal. Cardiovascular system: S1 & S2 heard, RRR. No JVD, murmurs, rubs, gallops or clicks.  Trace bilateral lower extremity edema.   Gastrointestinal system: Abdomen is mildly distended, soft, nontender to palpation, hypoactive bowel sounds, no rebound, no guarding. Central nervous system: Alert and oriented. No focal neurological deficits. Extremities: Trace bilateral lower extremity edema.   Skin: No rashes, lesions or ulcers Psychiatry: Judgement and insight appear normal. Mood & affect appropriate.     Data Reviewed: I have personally reviewed following labs and imaging studies  CBC: Recent Labs  Lab 02/23/19 1214 02/26/19 0400 02/27/19 0347  WBC 25.0* 12.6* 16.0*  NEUTROABS 22.4* 10.8*  --   HGB 10.2* 8.9* 9.5*  HCT 31.5* 28.8* 30.1*  MCV 93.5 97.6 96.2  PLT 171 99* 98*   Basic Metabolic Panel: Recent Labs  Lab 02/21/19 1128 02/22/19 0444 02/23/19 1214 02/24/19 0313 02/26/19 0400 02/27/19 0347  NA 139 137 140 141 137 131*  K 3.4* 3.4* 3.9 4.1 4.3 4.2  CL 106 104 108 107 100 97*  CO2 20* 22 24 26 29 28   GLUCOSE 113* 141* 149* 135* 139* 127*  BUN 58* 55* 63* 64* 69* 68*  CREATININE 3.09* 2.90* 2.50* 2.25* 2.19* 2.03*  CALCIUM 10.3 9.9 10.4* 10.7* 10.3 10.0  MG 2.3 2.0 1.9 1.8 1.9  --   PHOS 3.9 3.5 2.6 2.3* 2.9  --    GFR: Estimated Creatinine Clearance: 55.5 mL/min (A) (by C-G formula based on SCr of 2.03 mg/dL (H)). Liver Function Tests: Recent Labs  Lab 02/21/19 1128 02/22/19 0444 02/23/19 1214 02/26/19 0400 02/27/19 0347  AST 18 20 16 19  32  ALT 15 15 14 11 19   ALKPHOS 64 63 63 61 63  BILITOT 2.0* 1.5* 1.8* 1.6* 1.5*  PROT 6.3* 5.9* 6.0* 5.3* 5.7*  ALBUMIN 2.5* 2.2* 2.1* 1.6* 1.6*   No results for input(s): LIPASE, AMYLASE in the last 168 hours. Recent Labs  Lab 02/23/19 1219  AMMONIA 13   Coagulation Profile: No results for input(s): INR, PROTIME in the  last 168 hours. Cardiac Enzymes: No results for input(s): CKTOTAL, CKMB, CKMBINDEX, TROPONINI in the last 168 hours. BNP (last 3 results) No results for input(s): PROBNP in the last 8760 hours. HbA1C: No results for input(s): HGBA1C in the last 72 hours. CBG: Recent Labs  Lab 02/26/19 1601 02/26/19 1937 02/27/19 0008 02/27/19 0416 02/27/19 0819  GLUCAP 158* 148* 250* 123* 173*   Lipid Profile: Recent Labs    02/26/19 0400  TRIG 32   Thyroid Function Tests: No results for input(s): TSH, T4TOTAL, FREET4, T3FREE, THYROIDAB in the last 72 hours. Anemia Panel: No results for input(s): VITAMINB12, FOLATE, FERRITIN, TIBC, IRON, RETICCTPCT in the last 72 hours. Sepsis Labs: No results for input(s): PROCALCITON, LATICACIDVEN in the last 168 hours.  No results found for this or any previous visit (from the past 240 hour(s)).       Radiology Studies: [place] Paracentesis  Result Date: 02/25/2019 INDICATION: Cirrhosis with recurrent ascites. Request for therapeutic paracentesis. EXAM: ULTRASOUND GUIDED RIGHT LOWER QUADRANT PARACENTESIS MEDICATIONS: None. COMPLICATIONS: None immediate. PROCEDURE: Informed written consent was obtained from the patient after a discussion of the risks, benefits and alternatives to treatment. A timeout was performed prior to the initiation of the procedure. Initial ultrasound scanning demonstrates a large amount of ascites within the right lower abdominal quadrant. The right lower abdomen was prepped and draped in the usual sterile fashion. 1% lidocaine was used for local anesthesia. Following this, a 19 gauge, 7-cm, Yueh catheter was introduced. An ultrasound image was saved for documentation purposes. The paracentesis was performed. The catheter was removed and a dressing was applied. The patient tolerated the procedure well without immediate post procedural complication. FINDINGS: A total of approximately 14 L of clear yellow fluid was removed. IMPRESSION:  Successful ultrasound-guided paracentesis yielding 14 liters of peritoneal fluid. Read by: Ascencion Dike PA-C Electronically Signed   By: Aletta Edouard M.D.   On: 02/25/2019 15:01        Scheduled Meds:  benztropine mesylate  0.5 mg Intramuscular Daily   Chlorhexidine Gluconate Cloth  6 each Topical Daily   haloperidol  2 mg Oral Daily   hydrocortisone sod succinate (SOLU-CORTEF) inj  25 mg Intravenous Q12H   insulin aspart  0-9 Units Subcutaneous Q4H   midodrine  10 mg Oral TID WC   Muscle Rub   Topical BID   OLANZapine zydis  2.5 mg Oral QHS   pantoprazole (PROTONIX) IV  40 mg Intravenous Q12H   sodium chloride flush  10-40 mL Intracatheter Q12H   Continuous Infusions:  sodium chloride     sodium chloride Stopped (02/12/19 1737)   sodium chloride Stopped (02/19/19 1422)   piperacillin-tazobactam (ZOSYN)  IV 3.375 g (02/27/19 1044)   TPN ADULT (ION) 75 mL/hr at 02/26/19 1728   TPN ADULT (ION)       LOS: 19 days    Time spent: 35 minutes    Irine Seal, MD [facility]  If 7PM-7AM, please contact night-coverage [email or web address] 02/27/2019, 11:45 AM

## 2019-02-27 NOTE — Progress Notes (Signed)
PHARMACY - ADULT TOTAL PARENTERAL NUTRITION CONSULT NOTE  Pharmacy Consult for TPN Indication: Stomach perforation on bowel rest  Patient Measurements: Height: 6\' 6"  (198.1 cm) Weight: 243 lb 9.7 oz (110.5 kg) IBW/kg (Calculated) : 91.4 TPN AdjBW (KG): 107.4 Body mass index is 28.15 kg/m. Usual Weight: ~100kg  Assessment:  89 YOM presenting 11/14 with abdominal pain/distention and rectal bleeding, found with bowel perforation tx with medical management and bowel rest.  Paracentesis 11/13. Started liquids on 11/18-19 now back on bowel rest and pharmacy consulted to start TPN.  Little PO intake since admission and prealbumin <5.    GI: worsened CT 11/21 pneumoperitoneum - gas between liver/duodenum. Perforation of distal stomach, GIB. Poor surgical candidate. Umbilical hernia note-stable.   Prealbumin up to 5.2, BM x1.  PPI IV BID Endo: A1c 3.9% - CBGs controlled except 2 values, ?related to PO intake.  HC for adrenal insuff (prednisone PTA).  Insulin requirements in the past 24 hours: 8 units sSSI  Lytes: Na 131, CoCa 11.92 (no calcium in TPN) Renal: CKD3 - SCr down 2.03 (BL 1.7-2), BUN 68 - UOP 0.1 ml/kg/hr, net +17.2L Pulm: stable on RA Cards: VSS - midodrine TID Hepatobil: hx cirrhosis Child Pugh C.  Ascites s/p paracentesis 11/29, 14L removed. LFTs WNL, tbili 1.5, TG WNL (ILE 20% of kCal to provide more CHO) Neuro: schizoaffective disorder, chronic pain - benztropine, Haldol, Zyprexa, PRN Fentanyl/Haldol ID: Zosyn for peritonitis (11/13 >> ) - afebrile, WBC up to 16 TPN Access: PICC 02/21/19 TPN start date: 02/18/19  Nutritional Goals (per RD rec on 11/23): 2450-2650 kCal, 125-145gm protein per day  Current Nutrition:  TPN Clear liquid diet  Plan:  Concentrated TPN at 80 ml/hr, providing 131g AA, 442g CHO and 51g ILE for a total of 2534 kCal, meeting 100% of patients needs Electrolytes in TPN: increase Na, no Ca, max acetate Daily multivitamin and trace elements in  TPN Continue sensitive SSI Q4H and adjust as needed D/C KVO D5W F/U AM labs, PO intake/diet advancement, continued GoC discussions (meeting again on 12/3)  Brenn Gatton D. Mina Marble, PharmD, BCPS, [place] 02/27/2019, 7:58 AM

## 2019-02-27 NOTE — Progress Notes (Signed)
OT Cancellation Note  Patient Details Name: [name] MRN: [id number] DOB: Sep 03, 1960   Cancelled Treatment:    Reason Eval/Treat Not Completed: Fatigue/lethargy limiting ability to participate;Other (comment) Pt unable to purposefully participate in OT session this morning. Attempted to arouse pt with pt forcefully stating, "no." Will check back as time allows.  Lanier Clam., COTA/L Acute Rehabilitation Services [phone] [place] 02/27/2019, 10:12 AM

## 2019-02-27 NOTE — TOC Progression Note (Signed)
Transition of Care [facility]) - Progression Note    Patient Details  Name: Kiara Keep MRN: [id number] Date of Birth: July 10, 1960  Transition of Care [facility]) CM/SW [place], [place] Phone Number: 02/27/2019, 12:37 PM  Clinical Narrative:    CSW is aware pt has scheduled meeting with friends and PMT on 12/3. TOC team continues to follow.    Expected Discharge Plan: Skilled Nursing Facility Barriers to Discharge: Family Issues, Continued Medical Work up  Expected Discharge Plan and Services Expected Discharge Plan: [place] In-house Referral: Clinical Social Work Discharge Planning Services: CM Consult Living arrangements for the past 2 months: Apartment     Readmission Risk Interventions Readmission Risk Prevention Plan 01/11/2019  Transportation Screening Complete  Medication Review Press photographer) Complete  PCP or Specialist appointment within 3-5 days of discharge Complete  HRI or [place] Complete  SW Recovery Care/Counseling Consult Complete  [place] Not Applicable  Some recent data might be hidden

## 2019-02-28 ENCOUNTER — Inpatient Hospital Stay (HOSPITAL_COMMUNITY): Payer: Medicaid Other

## 2019-02-28 LAB — CBC WITH DIFFERENTIAL/PLATELET
Abs Immature Granulocytes: 0.04 10*3/uL (ref 0.00–0.07)
Basophils Absolute: 0 10*3/uL (ref 0.0–0.1)
Basophils Relative: 0 %
Eosinophils Absolute: 0.1 10*3/uL (ref 0.0–0.5)
Eosinophils Relative: 1 %
HCT: 26.7 % — ABNORMAL LOW (ref 39.0–52.0)
Hemoglobin: 8.6 g/dL — ABNORMAL LOW (ref 13.0–17.0)
Immature Granulocytes: 0 %
Lymphocytes Relative: 5 %
Lymphs Abs: 0.6 10*3/uL — ABNORMAL LOW (ref 0.7–4.0)
MCH: 30.6 pg (ref 26.0–34.0)
MCHC: 32.2 g/dL (ref 30.0–36.0)
MCV: 95 fL (ref 80.0–100.0)
Monocytes Absolute: 0.6 10*3/uL (ref 0.1–1.0)
Monocytes Relative: 5 %
Neutro Abs: 11.8 10*3/uL — ABNORMAL HIGH (ref 1.7–7.7)
Neutrophils Relative %: 89 %
Platelets: 69 10*3/uL — ABNORMAL LOW (ref 150–400)
RBC: 2.81 MIL/uL — ABNORMAL LOW (ref 4.22–5.81)
RDW: 19.3 % — ABNORMAL HIGH (ref 11.5–15.5)
WBC: 13.1 10*3/uL — ABNORMAL HIGH (ref 4.0–10.5)
nRBC: 0 % (ref 0.0–0.2)

## 2019-02-28 LAB — GLUCOSE, CAPILLARY
Glucose-Capillary: 138 mg/dL — ABNORMAL HIGH (ref 70–99)
Glucose-Capillary: 159 mg/dL — ABNORMAL HIGH (ref 70–99)
Glucose-Capillary: 175 mg/dL — ABNORMAL HIGH (ref 70–99)
Glucose-Capillary: 169 mg/dL — ABNORMAL HIGH (ref 70–99)
Glucose-Capillary: 181 mg/dL — ABNORMAL HIGH (ref 70–99)

## 2019-02-28 LAB — BASIC METABOLIC PANEL
Anion gap: 7 (ref 5–15)
BUN: 66 mg/dL — ABNORMAL HIGH (ref 6–20)
CO2: 30 mmol/L (ref 22–32)
Calcium: 9.9 mg/dL (ref 8.9–10.3)
Chloride: 92 mmol/L — ABNORMAL LOW (ref 98–111)
Creatinine, Ser: 2.06 mg/dL — ABNORMAL HIGH (ref 0.61–1.24)
GFR calc Af Amer: 40 mL/min — ABNORMAL LOW (ref >60)
GFR calc non Af Amer: 34 mL/min — ABNORMAL LOW (ref >60)
Glucose, Bld: 149 mg/dL — ABNORMAL HIGH (ref 70–99)
Potassium: 4.7 mmol/L (ref 3.5–5.1)
Sodium: 129 mmol/L — ABNORMAL LOW (ref 135–145)

## 2019-02-28 LAB — PHOSPHORUS: Phosphorus: 2.2 mg/dL — ABNORMAL LOW (ref 2.5–4.6)

## 2019-02-28 MED ORDER — SODIUM PHOSPHATES 45 MMOLE/15ML IV SOLN
15.0000 mmol | Freq: Once | INTRAVENOUS | Status: AC
  Administered 2019-02-28: 15 mmol via INTRAVENOUS
  Filled 2019-02-28: qty 5

## 2019-02-28 MED ORDER — TRACE MINERALS CU-MN-SE-ZN 300-55-60-3000 MCG/ML IV SOLN
INTRAVENOUS | Status: AC
  Administered 2019-02-28: 17:00:00 via INTRAVENOUS
  Filled 2019-02-28: qty 870.4

## 2019-02-28 MED ORDER — SODIUM CHLORIDE 0.9 % IV SOLN
INTRAVENOUS | Status: AC
  Administered 2019-02-28 – 2019-03-01 (×2): via INTRAVENOUS

## 2019-02-28 NOTE — Progress Notes (Signed)
PROGRESS NOTE    [name]  BPZ:[id number] DOB: 06-Feb-1961 DOA: 02/08/2019 PCP: Nolene Ebbs, MD   Brief Narrative:  58 year old with history of cirrhosis with splenomegaly, hepatitis C, esophageal varices, adrenal insufficiency, hypothyroidism, schizoaffective disorder, PTSD, CKD stage IIIa initially brought to the hospital for worsening abdominal pain, distention and rectal bleeding.  Work-up showed perforated bowel and pneumoperitoneum.  General surgery recommended nonoperative management with IV antibiotics and bowel rest.  Due to worsening of the pain, repeat CT of the abdomen pelvis showed free intra-abdominal fluid and pneumoperitoneum.  There is also perforated bowel near pyloric or stomach.  Underwent UGI on 11/16 showing focal contained perforation in distal stomach.  Palliative care team was consulted but patient is now any family.  Psychiatry evaluate the patient and stated does not have capacity to make his own decisions.   Assessment & Plan:   Principal Problem:   Bowel perforation (HCC) Active Problems:   Schizoaffective disorder (HCC)   PTSD (post-traumatic stress disorder)   History of adrenal insufficiency   GI bleed   Cirrhosis (HCC)   Esophageal varices in cirrhosis (HCC)   CKD (chronic kidney disease) stage 3, GFR 30-59 ml/min   Abdominal pain   Bleeding per rectum   Septic shock (HCC)   Pneumoperitoneum   Central venous catheter in place   Goals of care, counseling/discussion   Palliative care by specialist   AKI (acute kidney injury) ([place])   Palliative care encounter   Acute renal failure with acute tubular necrosis superimposed on stage 3b chronic kidney disease ([place])  Severe septic shock secondary to perforated bowel/pneumoperitoneum and gastric perforation -Seen by surgical services-recommend conservative management and IV antibiotics-Zosyn, given high risk of mortality in a poor surgical candidate.  Currently on bowel rest, TPN.  Patient is DNR but  wants all medical treatment.  Seen by palliative care service. -Upper GI series that showed gastric perforation with continuous peritoneum. -Repeat abdominal x-ray this morning-shows ascites. -Advanced his diet to full liquid.  Decompensated hepatitis C liver cirrhosis Thrombocytopenia this -Getting periodic paracentesis.  Supportive care.  Perform paracentesis as needed. She will follow outpatient gastroenterology.  Acute blood loss anemia/gastric and duodenal ulcer -EGD in October 2020 showed grade D esophagitis with nonbleeding gastric ulcer, duodenal ulcer and hiatal hernia. -Continue PPI  CKD stage IIIa -Baseline creatinine 1.8.  Creatinine slowly improving.  Creatinine this morning.  2.0.  Renal ultrasound is negative.  There is consideration of HRS therefore midodrine.  Was on albumin and octreotide  History of PTSD/schizoaffective disorder Seen by psychiatry patient deemed not to capacity to make medical decision.  DVT prophylaxis: CDs Code Status: DNR Family Communication: None at bedside Disposition Plan: To be determined, overall patient has very poor prognosis.  Eventually need skilled nursing facility.  Consultants:   Palliative care Antimicrobials:   IV Zosyn   Subjective: He appears slightly confused.  He continues to keep asking for pain medication shots and something to eat.  Overall a poor historian.  Review of Systems Otherwise negative except as per HPI, including: Difficult to obtain given his mentation.  Objective: Vitals:   02/27/19 0537 02/27/19 1338 02/27/19 2053 02/28/19 0426  BP:  120/66 (!) 108/57 118/61  Pulse:  82 81 86  Resp:  18 18 15   Temp:  97.8 F (36.6 C) 98 F (36.7 C) 98.2 F (36.8 C)  TempSrc:  Oral Oral Oral  SpO2:  100% 99% 95%  Weight: 110.5 kg     Height:  Intake/Output Summary (Last 24 hours) at 02/28/2019 1424 Last data filed at 02/28/2019 0946 Gross per 24 hour  Intake 3254.35 ml  Output 500 ml  Net 2754.35  ml   Filed Weights   02/23/19 0500 02/25/19 0500 02/27/19 0537  Weight: 114.2 kg 114 kg 110.5 kg    Examination:  General exam: Awake alert, not in acute distress.  Dry mouth.  Appears chronically ill. Respiratory system: Clear to auscultation. Respiratory effort normal. Cardiovascular system: S1 & S2 heard, RRR. No JVD, murmurs, rubs, gallops or clicks. No pedal edema. Gastrointestinal system: Abdomen is nontender but quite distended.  Positive fluid wave shift. Central nervous system: Alert and oriented to name only.  Does not know the name of the hospital but knows he is in [place]. Extremities: Symmetric 4 x 5 power. Skin: No rashes, lesions or ulcers Psychiatry: Poor judgment and insight    Data Reviewed:   CBC: Recent Labs  Lab 02/23/19 1214 02/26/19 0400 02/27/19 0347 02/28/19 0359  WBC 25.0* 12.6* 16.0* 13.1*  NEUTROABS 22.4* 10.8*  --  11.8*  HGB 10.2* 8.9* 9.5* 8.6*  HCT 31.5* 28.8* 30.1* 26.7*  MCV 93.5 97.6 96.2 95.0  PLT 171 99* 98* 69*   Basic Metabolic Panel: Recent Labs  Lab 02/22/19 0444 02/23/19 1214 02/24/19 0313 02/26/19 0400 02/27/19 0347 02/28/19 0359  NA 137 140 141 137 131* 129*  K 3.4* 3.9 4.1 4.3 4.2 4.7  CL 104 108 107 100 97* 92*  CO2 22 24 26 29 28 30   GLUCOSE 141* 149* 135* 139* 127* 149*  BUN 55* 63* 64* 69* 68* 66*  CREATININE 2.90* 2.50* 2.25* 2.19* 2.03* 2.06*  CALCIUM 9.9 10.4* 10.7* 10.3 10.0 9.9  MG 2.0 1.9 1.8 1.9  --   --   PHOS 3.5 2.6 2.3* 2.9  --  2.2*   GFR: Estimated Creatinine Clearance: 54.7 mL/min (A) (by C-G formula based on SCr of 2.06 mg/dL (H)). Liver Function Tests: Recent Labs  Lab 02/22/19 0444 02/23/19 1214 02/26/19 0400 02/27/19 0347  AST 20 16 19  32  ALT 15 14 11 19   ALKPHOS 63 63 61 63  BILITOT 1.5* 1.8* 1.6* 1.5*  PROT 5.9* 6.0* 5.3* 5.7*  ALBUMIN 2.2* 2.1* 1.6* 1.6*   No results for input(s): LIPASE, AMYLASE in the last 168 hours. Recent Labs  Lab 02/23/19 1219  AMMONIA 13    Coagulation Profile: No results for input(s): INR, PROTIME in the last 168 hours. Cardiac Enzymes: No results for input(s): CKTOTAL, CKMB, CKMBINDEX, TROPONINI in the last 168 hours. BNP (last 3 results) No results for input(s): PROBNP in the last 8760 hours. HbA1C: No results for input(s): HGBA1C in the last 72 hours. CBG: Recent Labs  Lab 02/27/19 1949 02/27/19 2348 02/28/19 0421 02/28/19 0826 02/28/19 1218  GLUCAP 167* 136* 138* 159* 175*   Lipid Profile: Recent Labs    02/26/19 0400  TRIG 32   Thyroid Function Tests: No results for input(s): TSH, T4TOTAL, FREET4, T3FREE, THYROIDAB in the last 72 hours. Anemia Panel: No results for input(s): VITAMINB12, FOLATE, FERRITIN, TIBC, IRON, RETICCTPCT in the last 72 hours. Sepsis Labs: No results for input(s): PROCALCITON, LATICACIDVEN in the last 168 hours.  No results found for this or any previous visit (from the past 240 hour(s)).       Radiology Studies: Dg Abd Portable 1v  Result Date: 02/28/2019 CLINICAL DATA:  Abdominal distension, history of cirrhosis EXAM: PORTABLE ABDOMEN - 1 VIEW COMPARISON:  None FINDINGS: Centralization of bowel  loops, which may reflect presence of ascites. Bowel gas pattern is otherwise unremarkable. Chronic degenerative changes and heterotopic ossification at the right hip. IMPRESSION: Bowel gas pattern suggestive of ascites. Electronically Signed   By: Guadlupe Spanish M.D.   On: 02/28/2019 10:33        Scheduled Meds: . benztropine mesylate  0.5 mg Intramuscular Daily  . Chlorhexidine Gluconate Cloth  6 each Topical Daily  . haloperidol  2 mg Oral Daily  . hydrocortisone sod succinate (SOLU-CORTEF) inj  25 mg Intravenous Q12H  . insulin aspart  0-9 Units Subcutaneous Q4H  . midodrine  10 mg Oral TID WC  . Muscle Rub   Topical BID  . OLANZapine zydis  2.5 mg Oral QHS  . pantoprazole (PROTONIX) IV  40 mg Intravenous Q12H  . sodium chloride flush  10-40 mL Intracatheter Q12H    Continuous Infusions: . sodium chloride    . sodium chloride Stopped (02/12/19 1737)  . sodium chloride Stopped (02/19/19 1422)  . sodium chloride 75 mL/hr at 02/28/19 0930  . piperacillin-tazobactam (ZOSYN)  IV 3.375 g (02/28/19 1227)  . sodium phosphate  Dextrose 5% IVPB 15 mmol (02/28/19 1154)  . TPN ADULT (ION) 80 mL/hr at 02/28/19 0800  . TPN ADULT (ION)       LOS: 20 days   Time spent= 35 mins     Joline Maxcy, MD [facility]  If 7PM-7AM, please contact night-coverage  02/28/2019, 2:24 PM

## 2019-02-28 NOTE — Progress Notes (Signed)
PHARMACY - ADULT TOTAL PARENTERAL NUTRITION CONSULT NOTE  Pharmacy Consult for TPN Indication: Stomach perforation on bowel rest  Patient Measurements: Height: 6\' 6"  (198.1 cm) Weight: 243 lb 9.7 oz (110.5 kg) IBW/kg (Calculated) : 91.4 TPN AdjBW (KG): 107.4 Body mass index is 28.15 kg/m. Usual Weight: ~100kg  Assessment:  82 YOM presenting 11/14 with abdominal pain/distention and rectal bleeding, found with bowel perforation tx with medical management and bowel rest.  Paracentesis 11/13. Started liquids on 11/18-19 now back on bowel rest and pharmacy consulted to start TPN.  Little PO intake since admission and prealbumin <5.    GI: worsened CT 11/21 pneumoperitoneum - gas between liver/duodenum. Perforation of distal stomach, GIB. Poor surgical candidate. Umbilical hernia note-stable.   Prealbumin up to 5.2, BM x2.  PPI IV BID Endo: A1c 3.9% - CBGs controlled.  HC for adrenal insuff (prednisone PTA).  Insulin requirements in the past 24 hours: 11 units sSSI  Lytes: Na decreased further to 129, low CK, CoCa 11.82 (no calcium in TPN), Phos 2.2 Renal: CKD3 - SCr 2.06 (BL 1.7-2), BUN 66 - UOP 0.4 ml/kg/hr, net +18.8L, off D5W 12/1 Pulm: stable on RA Cards: VSS - midodrine TID Hepatobil: hx cirrhosis Child Pugh C.  Ascites s/p paracentesis 11/29, 14L removed. LFTs WNL, tbili 1.5, TG WNL (ILE 20% of kCal to provide more CHO) Neuro: schizoaffective disorder, chronic pain - benztropine, Haldol, Zyprexa, PRN Fentanyl/Haldol ID: Zosyn for peritonitis (11/13 >> ) - afebrile, WBC down to 13.1 TPN Access: PICC 02/21/19 TPN start date: 02/18/19  Nutritional Goals (per RD rec on 11/23): 2450-2650 kCal, 125-145gm protein per day  Current Nutrition:  TPN Clear liquid diet  Plan:  Continue concentrated TPN at 80 ml/hr, providing 131g AA, 442g CHO and 51g ILE for a total of 2534 kCal, meeting 100% of patients needs Electrolytes in TPN: increase Na/Phos, reduce K slightly, no Ca, change Cl:Ac  to 2:1 Daily multivitamin and trace elements in TPN Continue sensitive SSI Q4H - consider reducing frequency soon NaPhos 15 mmol IV x 1 F/U AM labs, PO intake/diet advancement, continued GoC discussions (meeting again on 12/3), watch plts  [name], PharmD, BCPS, [place] 02/28/2019, 8:32 AM

## 2019-02-28 NOTE — Progress Notes (Signed)
PICC line care: Dressing appears soiled, though dry. Transparent film with small hole. Attempted to change dressing. Pt refused. Will re-attempt later.

## 2019-03-01 DIAGNOSIS — L8990 Pressure ulcer of unspecified site, unspecified stage: Secondary | ICD-10-CM | POA: Insufficient documentation

## 2019-03-01 DIAGNOSIS — Z7189 Other specified counseling: Secondary | ICD-10-CM

## 2019-03-01 DIAGNOSIS — R140 Abdominal distension (gaseous): Secondary | ICD-10-CM

## 2019-03-01 LAB — COMPREHENSIVE METABOLIC PANEL
ALT: 19 U/L (ref 0–44)
AST: 27 U/L (ref 15–41)
Albumin: 1.5 g/dL — ABNORMAL LOW (ref 3.5–5.0)
Alkaline Phosphatase: 77 U/L (ref 38–126)
Anion gap: 8 (ref 5–15)
BUN: 68 mg/dL — ABNORMAL HIGH (ref 6–20)
CO2: 29 mmol/L (ref 22–32)
Calcium: 10.3 mg/dL (ref 8.9–10.3)
Chloride: 94 mmol/L — ABNORMAL LOW (ref 98–111)
Creatinine, Ser: 1.99 mg/dL — ABNORMAL HIGH (ref 0.61–1.24)
GFR calc Af Amer: 42 mL/min — ABNORMAL LOW (ref >60)
GFR calc non Af Amer: 36 mL/min — ABNORMAL LOW (ref >60)
Glucose, Bld: 133 mg/dL — ABNORMAL HIGH (ref 70–99)
Potassium: 4.7 mmol/L (ref 3.5–5.1)
Sodium: 131 mmol/L — ABNORMAL LOW (ref 135–145)
Total Bilirubin: 1 mg/dL (ref 0.3–1.2)
Total Protein: 5.7 g/dL — ABNORMAL LOW (ref 6.5–8.1)

## 2019-03-01 LAB — GLUCOSE, CAPILLARY
Glucose-Capillary: 137 mg/dL — ABNORMAL HIGH (ref 70–99)
Glucose-Capillary: 140 mg/dL — ABNORMAL HIGH (ref 70–99)
Glucose-Capillary: 148 mg/dL — ABNORMAL HIGH (ref 70–99)
Glucose-Capillary: 149 mg/dL — ABNORMAL HIGH (ref 70–99)
Glucose-Capillary: 166 mg/dL — ABNORMAL HIGH (ref 70–99)
Glucose-Capillary: 171 mg/dL — ABNORMAL HIGH (ref 70–99)

## 2019-03-01 LAB — MAGNESIUM: Magnesium: 1.8 mg/dL (ref 1.7–2.4)

## 2019-03-01 LAB — PHOSPHORUS: Phosphorus: 3.3 mg/dL (ref 2.5–4.6)

## 2019-03-01 MED ORDER — OLANZAPINE 5 MG PO TBDP
5.0000 mg | ORAL_TABLET | Freq: Every day | ORAL | Status: DC
  Administered 2019-03-01 – 2019-03-25 (×25): 5 mg via ORAL
  Filled 2019-03-01 (×28): qty 1

## 2019-03-01 MED ORDER — TRACE MINERALS CU-MN-SE-ZN 300-55-60-3000 MCG/ML IV SOLN
INTRAVENOUS | Status: AC
  Administered 2019-03-01: 18:00:00 via INTRAVENOUS
  Filled 2019-03-01: qty 870.4

## 2019-03-01 NOTE — Progress Notes (Signed)
PROGRESS NOTE  Juliann Pulsednan Rake ZOX:[id number] DOB: 12/23/1960 DOA: 02/08/2019 PCP: Fleet ContrasAvbuere, Edwin, MD  HPI/Recap of past 5224 hours:  58 year old with history of cirrhosis with splenomegaly, hepatitis C, esophageal varices, adrenal insufficiency, hypothyroidism, schizoaffective disorder, PTSD, CKD stage IIIa initially brought to the hospital for worsening abdominal pain, distention and rectal bleeding.  Work-up showed perforated bowel and pneumoperitoneum.  General surgery recommended nonoperative management with IV antibiotics and bowel rest.  Due to worsening of the pain, repeat CT of the abdomen pelvis showed free intra-abdominal fluid and pneumoperitoneum.  There is also perforated bowel near pyloric or stomach.  Underwent UGI on 11/16 showing focal contained perforation in distal stomach.  Palliative care team was consulted but patient is now any family.  Psychiatry evaluate the patient and stated does not have capacity to make his own decisions.  03/01/19: Patient was seen and examined at bedside this morning.  He is alert however does not answer questions appropriately.  Later this morning in need of one to one sitter for own safety.  High risk for pulling out his IV.  Assessment/Plan: Principal Problem:   Bowel perforation (HCC) Active Problems:   Schizoaffective disorder (HCC)   PTSD (post-traumatic stress disorder)   History of adrenal insufficiency   GI bleed   Cirrhosis (HCC)   Esophageal varices in cirrhosis (HCC)   CKD (chronic kidney disease) stage 3, GFR 30-59 ml/min   Abdominal pain   Bleeding per rectum   Septic shock (HCC)   Pneumoperitoneum   Central venous catheter in place   Goals of care, counseling/discussion   Palliative care by specialist   AKI (acute kidney injury) (HCC)   Palliative care encounter   Acute renal failure with acute tubular necrosis superimposed on stage 3b chronic kidney disease (HCC)   Pressure injury of skin  Severe septic shock secondary to  perforated bowel/pneumoperitoneum and gastric perforation -Seen by surgical services-recommend conservative management and IV antibiotics-Zosyn, given high risk of mortality in a poor surgical candidate.  Currently on full liquid diet and TPN.  Patient is DNR but wants all medical treatment.  Seen by palliative care service. -Upper GI series that showed gastric perforation with continuous peritoneum. -Repeat abdominal x-ray 12/2-shows ascites. Leukocytosis 04/17/2018, WBC 13,000. Repeat WBC in the morning Monitor fever curve and white count.  Acute metabolic encephalopathy likely multifactorial secondary to sepsis versus uncontrolled underlying psychiatric illness. Reorient as needed One-to-one in place for patient's own safety  Decompensated hepatitis C liver cirrhosis with ascites Avoid hepatotoxic agents Last paracentesis was on 02/25/2019 by interventional radiology with 14 L of clear yellow fluid removed.  Thrombocytopenia likely in the setting of acute illness and decompensated hepatitis C liver cirrhosis -Getting periodic paracentesis.   Continue supportive care.   Paracentesis as needed.  Will need to follow-up appointment GI outpatient.  Acute blood loss anemia/gastric and duodenal ulcer -EGD in October 2020 showed grade D esophagitis with nonbleeding gastric ulcer, duodenal ulcer and hiatal hernia. -Continue PPI  Hyperglycemia, intermittently Last hemoglobin A1c 3.9 on 02/09/2019 Hold off insulin to avoid hypoglycemia  CKD stage IIIa -Baseline creatinine 1.8.  Creatinine slowly improving.    Creatinine this morning 1.99 on 03/01/19. Renal ultrasound is negative.  There is consideration of HRS therefore midodrine.  Was on albumin and octreotide  History of PTSD/schizoaffective disorder Seen by psychiatry patient deemed not to capacity to make medical decision.  DVT prophylaxis: SCDs due to worsening thrombocytopenia. Code Status: DNR Family Communication: None at  bedside Disposition Plan:  Patient  is currently not appropriate for discharge due to acute metabolic encephalopathy.  Consultants:   Palliative care Antimicrobials:   IV Zosyn   Objective: Vitals:   02/28/19 0426 02/28/19 1500 03/01/19 0407 03/01/19 0527  BP: 118/61 133/73 127/71   Pulse: 86 81 82   Resp: 15 16 18    Temp: 98.2 F (36.8 C) (!) 97.4 F (36.3 C) 97.7 F (36.5 C)   TempSrc: Oral Oral Oral   SpO2: 95% 99% 96%   Weight:    113.5 kg  Height:        Intake/Output Summary (Last 24 hours) at 03/01/2019 1311 Last data filed at 03/01/2019 0900 Gross per 24 hour  Intake 4900.78 ml  Output 1050 ml  Net 3850.78 ml   Filed Weights   02/25/19 0500 02/27/19 0537 03/01/19 0527  Weight: 114 kg 110.5 kg 113.5 kg    Exam:  . General: 58 y.o. year-old male well developed well nourished in no acute distress.  Alert and confused. . Cardiovascular: Regular rate and rhythm with no rubs or gallops.  No thyromegaly or JVD noted.   Marland Kitchen Respiratory: Clear to auscultation with no wheezes or rales. Good inspiratory effort. . Abdomen: Soft nontender, distended  . Musculoskeletal: Trace lower extremity edema. 2/4 pulses in all 4 extremities. Marland Kitchen Psychiatry: Mood is appropriate for condition and setting   Data Reviewed: CBC: Recent Labs  Lab 02/23/19 1214 02/26/19 0400 02/27/19 0347 02/28/19 0359  WBC 25.0* 12.6* 16.0* 13.1*  NEUTROABS 22.4* 10.8*  --  11.8*  HGB 10.2* 8.9* 9.5* 8.6*  HCT 31.5* 28.8* 30.1* 26.7*  MCV 93.5 97.6 96.2 95.0  PLT 171 99* 98* 69*   Basic Metabolic Panel: Recent Labs  Lab 02/23/19 1214 02/24/19 0313 02/26/19 0400 02/27/19 0347 02/28/19 0359 03/01/19 0341  NA 140 141 137 131* 129* 131*  K 3.9 4.1 4.3 4.2 4.7 4.7  CL 108 107 100 97* 92* 94*  CO2 24 26 29 28 30 29   GLUCOSE 149* 135* 139* 127* 149* 133*  BUN 63* 64* 69* 68* 66* 68*  CREATININE 2.50* 2.25* 2.19* 2.03* 2.06* 1.99*  CALCIUM 10.4* 10.7* 10.3 10.0 9.9 10.3  MG 1.9 1.8 1.9  --    --  1.8  PHOS 2.6 2.3* 2.9  --  2.2* 3.3   GFR: Estimated Creatinine Clearance: 57.3 mL/min (A) (by C-G formula based on SCr of 1.99 mg/dL (H)). Liver Function Tests: Recent Labs  Lab 02/23/19 1214 02/26/19 0400 02/27/19 0347 03/01/19 0341  AST 16 19 32 27  ALT 14 11 19 19   ALKPHOS 63 61 63 77  BILITOT 1.8* 1.6* 1.5* 1.0  PROT 6.0* 5.3* 5.7* 5.7*  ALBUMIN 2.1* 1.6* 1.6* 1.5*   No results for input(s): LIPASE, AMYLASE in the last 168 hours. Recent Labs  Lab 02/23/19 1219  AMMONIA 13   Coagulation Profile: No results for input(s): INR, PROTIME in the last 168 hours. Cardiac Enzymes: No results for input(s): CKTOTAL, CKMB, CKMBINDEX, TROPONINI in the last 168 hours. BNP (last 3 results) No results for input(s): PROBNP in the last 8760 hours. HbA1C: No results for input(s): HGBA1C in the last 72 hours. CBG: Recent Labs  Lab 02/28/19 1545 02/28/19 2004 03/01/19 0022 03/01/19 0404 03/01/19 0748  GLUCAP 169* 181* 140* 148* 149*   Lipid Profile: No results for input(s): CHOL, HDL, LDLCALC, TRIG, CHOLHDL, LDLDIRECT in the last 72 hours. Thyroid Function Tests: No results for input(s): TSH, T4TOTAL, FREET4, T3FREE, THYROIDAB in the last 72 hours. Anemia Panel:  No results for input(s): VITAMINB12, FOLATE, FERRITIN, TIBC, IRON, RETICCTPCT in the last 72 hours. Urine analysis:    Component Value Date/Time   COLORURINE YELLOW 02/18/2019 1839   APPEARANCEUR HAZY (A) 02/18/2019 1839   LABSPEC 1.019 02/18/2019 1839   PHURINE 5.0 02/18/2019 1839   GLUCOSEU NEGATIVE 02/18/2019 1839   HGBUR NEGATIVE 02/18/2019 1839   BILIRUBINUR NEGATIVE 02/18/2019 1839   KETONESUR NEGATIVE 02/18/2019 1839   PROTEINUR NEGATIVE 02/18/2019 1839   UROBILINOGEN 1.0 01/19/2013 0015   NITRITE NEGATIVE 02/18/2019 1839   LEUKOCYTESUR NEGATIVE 02/18/2019 1839   Sepsis Labs: @LABRCNTIP (procalcitonin:4,lacticidven:4)  )No results found for this or any previous visit (from the past 240 hour(s)).     Studies: No results found.  Scheduled Meds: . benztropine mesylate  0.5 mg Intramuscular Daily  . Chlorhexidine Gluconate Cloth  6 each Topical Daily  . haloperidol  2 mg Oral Daily  . hydrocortisone sod succinate (SOLU-CORTEF) inj  25 mg Intravenous Q12H  . insulin aspart  0-9 Units Subcutaneous Q4H  . midodrine  10 mg Oral TID WC  . Muscle Rub   Topical BID  . OLANZapine zydis  2.5 mg Oral QHS  . pantoprazole (PROTONIX) IV  40 mg Intravenous Q12H  . sodium chloride flush  10-40 mL Intracatheter Q12H    Continuous Infusions: . sodium chloride    . sodium chloride Stopped (02/12/19 1737)  . sodium chloride Stopped (02/19/19 1422)  . piperacillin-tazobactam (ZOSYN)  IV 3.375 g (03/01/19 1119)  . TPN ADULT (ION) 80 mL/hr at 03/01/19 14/03/20  . TPN ADULT (ION)       LOS: 21 days     [id number], MD [facility] Pager [phone]  If 7PM-7AM, please contact night-coverage [email or web address] Password [email or web address] 03/01/2019, 1:11 PM

## 2019-03-01 NOTE — Progress Notes (Signed)
Physical Therapy Treatment Patient Details Name: [name] MRN: [id number] DOB: 1961/01/29 Today's Date: 03/01/2019    History of Present Illness Pt is a 58 y/o male admitted secondary to abdominal pain, distention and rectal bleeding. Pt found to have a bowel perforation. Per surgical team, no surgery indicated as pt is a "poor surgical candidate". Paracentesis completed on 11/18 with 5L removed. PMH including but not limited to cirrhosis, hep C, schizoaffective disorder, PTSD and CKD.    PT Comments    Pt in bed upon PT arrival, agreeable to PT session if RN arrives to bring pain medications. While waiting for the RN, the pt asked to don his personal shoes rather than hospital socks, but required PT to complete the task. The pt then agreed to sit EOB, but requires sig assist and encouragement to move to sitting EOB. Pt insists PT moves his limbs, stating "you lift" but is able to generate some movement when assisted to bring BLE to EOB (moves LLE > RLE). The pt then again required sig encouragement and support to raise trunk from elevated HOB, stating "you pull" each time the PT gave cues for hand placement or technique. The pt was able to come to sitting EOB. And maintain with minA and BUE support, until pt began requesting return to bed. Pt unable to verbalize reason for return to bed, and again required total assist to return BLE to the bed. The pt will continue to benefit from skilled PT to address functional mobility and independence, recommend SNF placement due to sig assist needed by pt.     Follow Up Recommendations  SNF;Supervision/Assistance - 24 hour     Equipment Recommendations       Recommendations for Other Services       Precautions / Restrictions Precautions Precautions: Fall Precaution Comments: rectal tube Restrictions Weight Bearing Restrictions: No    Mobility  Bed Mobility Overal bed mobility: Needs Assistance Bed Mobility: Supine to Sit;Sit to Supine      Supine to sit: Max assist;Mod assist Sit to supine: Mod assist   General bed mobility comments: Pt with decreased initiation, repetedly stating "you lift" or "you move" regardless of PT encouragement for him to initiate movements. The pt is able to move legs minimally, modA to get BLE to EOB, mod/maxA for trunk elevation. Pt is able to return to bed with modA for BLE but is able to reposition his trunk on his own.  Transfers Overall transfer level: (pt refused further transfer at this time)               General transfer comment: unable today due to pt refusal  Ambulation/Gait             General Gait Details: unable   Stairs             Wheelchair Mobility    Modified Rankin (Stroke Patients Only)       Balance Overall balance assessment: Needs assistance Sitting-balance support: Feet supported;Single extremity supported Sitting balance-Leahy Scale: Fair Sitting balance - Comments: pt able to maintain, but impuslively leaned back without warning requiring modA to slow and control return to bed Postural control: Posterior lean     Standing balance comment: unable                            Cognition Arousal/Alertness: Awake/alert Behavior During Therapy: Flat affect;Restless Overall Cognitive Status: Difficult to assess Area of Impairment: Orientation;Memory;Following commands;Safety/judgement;Problem solving  Orientation Level: Disoriented to;Situation;Time;Place   Memory: Decreased short-term memory Following Commands: Follows one step commands inconsistently(pt instructing PT to move his limbs for him, unsure if lack of command following is due to inability or unwillingness.) Safety/Judgement: Decreased awareness of safety;Decreased awareness of deficits   Problem Solving: Decreased initiation;Difficulty sequencing;Requires verbal cues;Requires tactile cues General Comments: pt perseverates on few topics,  repeatedly stating "you lift" (indicating he wanted PT to move his limbs for him) and generally asking PT to perform movements for him. When sitting EOB, the pt began saying "put me back, put me back" indicating he wanted to go back to bed, the pt could not verbalize why he wanted to go back to bed (pain, fatigue, dizziness, etc), and stated he did not want to work with PT anymore.      Exercises      General Comments        Pertinent Vitals/Pain Pain Assessment: 0-10 Pain Score: 10-Worst pain ever Pain Location: pt reports "everywhere, 10" but only at end of session after sitting EOB, unsure of cause of pain, generalized? Pain Descriptors / Indicators: Grimacing;Moaning;Guarding Pain Intervention(s): Limited activity within patient's tolerance;Monitored during session;Repositioned;RN gave pain meds during session;Patient requesting pain meds-RN notified    Home Living                      Prior Function            PT Goals (current goals can now be found in the care plan section) Acute Rehab PT Goals Patient Stated Goal: get up out of bed, go outside PT Goal Formulation: With patient Time For Goal Achievement: 03/01/19 Potential to Achieve Goals: Fair Progress towards PT goals: Progressing toward goals    Frequency    Min 2X/week      PT Plan Current plan remains appropriate    Co-evaluation              AM-PAC PT "6 Clicks" Mobility   Outcome Measure  Help needed turning from your back to your side while in a flat bed without using bedrails?: A Lot Help needed moving from lying on your back to sitting on the side of a flat bed without using bedrails?: A Lot Help needed moving to and from a bed to a chair (including a wheelchair)?: Total Help needed standing up from a chair using your arms (e.g., wheelchair or bedside chair)?: Total Help needed to walk in hospital room?: Total Help needed climbing 3-5 steps with a railing? : Total 6 Click Score:  8    End of Session   Activity Tolerance: Treatment limited secondary to agitation Patient left: in bed;with call bell/phone within reach;with bed alarm set;with nursing/sitter in room Nurse Communication: Mobility status;Patient requests pain meds PT Visit Diagnosis: Other abnormalities of gait and mobility (R26.89) Pain - Right/Left: (generalized?) Pain - part of body: (abdomen, generalized?)     Time: 9798-9211 PT Time Calculation (min) (ACUTE ONLY): 24 min  Charges:  $Therapeutic Activity: 23-37 mins                     Metro Kung, PT, DPT   Acute Rehabilitation Department [phone]   Gaetana Michaelis 03/01/2019, 3:09 PM

## 2019-03-01 NOTE — Progress Notes (Addendum)
Daily Progress Note   Patient Name: [name]       Date: 03/01/2019 DOB: 1960-09-29  Age: 58 y.o. MRN#: [id number] Attending Physician: Kayleen Memos, DO Primary Care Physician: Nolene Ebbs, MD Admit Date: 02/08/2019  Reason for Consultation/Follow-up: Establishing goals of care  Subjective: I met with [name] for continued discussion regarding [place]. Evaluated patient for mental status before meeting with them. He remains without insight into his condition. He notes diffuse pain from arthritis- per [name] he has history of rheumatoid arthritis which has caused him pain in the past- for which he was previously taking oxycodone- but did overdose accidentally after his mother died as she was the one who would control the administration to him.  I offered to transition [name] to fentanyl patch- but he refuses. He does request prn pain medication and he requests haldol. He tells me, "I want to clear my mind". He denies auditory or visual hallucinations, however, he is having delusions- he told me that the nurses "jumped" on him this morning- making indications that they were physically jumping on him. Additionally- [name] notes that [name] told her he believed one of their friends had called and told him "his life isn't worth living"- and [name] is certain this is not something their friend would say to him.  [name] and [name] state they met with [name] to help with [place]. Per their discussion with the Hull they would like to reverse his status to full code, and continue full aggressive medical care. They state that if Sebert was weak, wasn't talking, and looked very sick, then they would reconsider- however, since Baden has expressed his wishes to them in the past for Full Code, full scope  care- and he has been coded and on a ventilator before- and his mother was told he would not survive but he did- then this is guiding their decision making at the moment. They do state though that [place] can be revisited if patient starts to decline.  [name] and [name] note that they would agree that [name] needs to discharge to a skilled nursing facility. We discussed that [name] is still reliant on TPN and is not participating in physical therapy. [name] had a strong conversation with [name] in his language telling him he needed to participate in physical therapy and [name]  agreed that he would- he stated it was difficult because he was in pain.  MOST form was completed with selections of- FULL CODE; FULL SCOPE CARE; IV ANTIBIOTICS IF INDICATED; IV FLUIDS IF INDICATED; FEEDING TUBE FOR TRIAL PERIOD  Review of Systems  Unable to perform ROS: Mental acuity    Length of Stay: 21  Current Medications: Scheduled Meds:  . benztropine mesylate  0.5 mg Intramuscular Daily  . Chlorhexidine Gluconate Cloth  6 each Topical Daily  . haloperidol  2 mg Oral Daily  . hydrocortisone sod succinate (SOLU-CORTEF) inj  25 mg Intravenous Q12H  . insulin aspart  0-9 Units Subcutaneous Q4H  . midodrine  10 mg Oral TID WC  . Muscle Rub   Topical BID  . OLANZapine zydis  2.5 mg Oral QHS  . pantoprazole (PROTONIX) IV  40 mg Intravenous Q12H  . sodium chloride flush  10-40 mL Intracatheter Q12H    Continuous Infusions: . sodium chloride    . sodium chloride Stopped (02/12/19 1737)  . sodium chloride Stopped (02/19/19 1422)  . piperacillin-tazobactam (ZOSYN)  IV 3.375 g (03/01/19 1119)  . TPN ADULT (ION) 80 mL/hr at 03/01/19 4580  . TPN ADULT (ION)      PRN Meds: Place/Maintain arterial line **AND** sodium chloride, sodium chloride, fentaNYL (SUBLIMAZE) injection, haloperidol lactate, lidocaine (PF), lidocaine, ondansetron (ZOFRAN) IV, sodium chloride flush, sodium chloride flush, sodium chloride flush  Physical  Exam Vitals signs and nursing note reviewed.  Abdominal:     General: There is distension.  Musculoskeletal:     Right lower leg: Edema present.     Left lower leg: Edema present.  Neurological:     Mental Status: He is alert and oriented to person, place, and time.     Motor: Weakness present.  Psychiatric:        Mood and Affect: Affect is angry.        Behavior: Behavior is agitated and combative.        Thought Content: Thought content is delusional.             Vital Signs: BP 127/71 (BP Location: Left Arm)   Pulse 82   Temp 97.7 F (36.5 C) (Oral)   Resp 18   Ht _0  (1.981 m)   Wt 113.5 kg   SpO2 96%   BMI 28.92 kg/m  SpO2: SpO2: 96 % O2 Device: O2 Device: Room Air O2 Flow Rate:    Intake/output summary:   Intake/Output Summary (Last 24 hours) at 03/01/2019 1316 Last data filed at 03/01/2019 0900 Gross per 24 hour  Intake 4900.78 ml  Output 1050 ml  Net 3850.78 ml   LBM: Last BM Date: 02/28/19 Baseline Weight: Weight: 99.8 kg Most recent weight: Weight: 113.5 kg       Palliative Assessment/Data: PPS: 20%    Flowsheet Rows     Most Recent Value  Intake Tab  Referral Department  Hospitalist  Unit at Time of Referral  ER  Date Notified  02/09/19  Palliative Care Type  New Palliative care  Reason for referral  Clarify Goals of Care  Date of Admission  02/08/19  Date first seen by Palliative Care  02/11/19  # of days Palliative referral response time  2 Day(s)  # of days IP prior to Palliative referral  1  Clinical Assessment  Psychosocial & Spiritual Assessment  Palliative Care Outcomes      Patient Active Problem List   Diagnosis Date Noted  . Pressure injury  of skin 03/01/2019  . Acute renal failure with acute tubular necrosis superimposed on stage 3b chronic kidney disease ([place])   . Palliative care encounter   . Palliative care by specialist   . AKI (acute kidney injury) ([place])   . Goals of care, counseling/discussion   . Septic shock  ([place]) 02/09/2019  . Pneumoperitoneum   . Central venous catheter in place   . Abdominal pain 02/08/2019  . Bowel perforation ([place]) 02/08/2019  . Bleeding per rectum   . Acute gastric ulcer   . Duodenal ulcer   . Acute esophagitis   . Acute blood loss anemia   . Hematemesis 01/08/2019  . Prolonged QT interval 01/08/2019  . Esophageal varices in cirrhosis ([place]) 01/08/2019  . CKD (chronic kidney disease) stage 3, GFR 30-59 ml/min 01/08/2019  . Suicidal ideation 01/08/2019  . Closed displaced fracture of neck of left fifth metacarpal bone 01/08/2019  . GI bleed 08/07/2018  . Acute abdominal pain   . Cirrhosis ([place])   . Generalized abdominal pain   . Hematemesis with nausea   . Evaluation by psychiatric service required   . Acute upper gastrointestinal bleeding 10/15/2017  . PTSD (post-traumatic stress disorder)   . Hypothyroidism   . History of adrenal insufficiency   . H/O diabetes insipidus   . Schizoaffective disorder ([place]) 11/08/2011  . Personality disorder ([place])   . Normocytic anemia   . Ascites   . Liver disease   . Hepatitis C   . Kidney disease   . GERD (gastroesophageal reflux disease)     Palliative Care Assessment & Plan   Patient Profile: Received call from [place]- patient's primary contact- she left a message that friends had met. I have called her back and left a message requesting a return call to discuss the outcome of their conversation.  Noted patient declined to work with PT today. Per sitter- he is mostly sleeping, did have breakfast of clear liquids- reportedly had some abdominal pain with this.  Had 14L off yesterday via paracentesis. On my eval [name] is lethargic. When asked how he is feeling, he replied, "good", then went back to sleep.   Assessment/Recommendations/Plan   Full code, full scope  Recommend giving pain medication and haldol before physical therapy  Will increase zyprexa to 57m for continued delusions and mood  MOST form  completed and placed on paper chart  PMT will shadow and intervene if patient declines- otherwise will see again next week for symptoms- please call if needed  Goals of Care and Additional Recommendations:  Limitations on Scope of Treatment: Full Scope Treatment  Code Status:  Full code  Prognosis:   Unable to determine  Discharge Planning:  To Be Determined  Care plan was discussed with patient's RN, IMonia Pouchwith SW, PT  Thank you for allowing the Palliative Medicine Team to assist in the care of this patient.   Time In: 1030 Time Out: 1140 Total Time 70 minutes Prolonged Time Billed yes      Greater than 50%  of this time was spent counseling and coordinating care related to the above assessment and plan.  KMariana Kaufman AGNP-C Palliative Medicine   Please contact Palliative Medicine Team phone at [phone] questions and concerns.

## 2019-03-01 NOTE — Progress Notes (Addendum)
PHARMACY - ADULT TOTAL PARENTERAL NUTRITION CONSULT NOTE  Indication: Stomach perforation on bowel rest  Patient Measurements: Height: 6\' 6"  (198.1 cm) Weight: 250 lb 3.6 oz (113.5 kg) IBW/kg (Calculated) : 91.4 TPN AdjBW (KG): 107.4 Body mass index is 28.92 kg/m. Usual Weight: ~100kg  Assessment:  3 YOM presenting 11/14 with abdominal pain/distention and rectal bleeding, found with bowel perforation tx with medical management and bowel rest.  Paracentesis 11/13. Started liquids on 11/18-19 now back on bowel rest and pharmacy consulted to start TPN.  Little PO intake since admission and prealbumin <5.    GI: worsened CT 11/21 pneumoperitoneum - gas between liver/duodenum. Perforation of distal stomach, GIB. Poor surgical candidate. Umbilical hernia note-stable.   Prealbumin up to 5.2, BM x2.  PPI IV BID C/o abd discomfort 12/3 Endo: A1c 3.9% - CBGs controlled.  HC for adrenal insuff (prednisone PTA).  Insulin requirements in the past 24 hours: 11 units sSSI  Lytes: Na/CL improved to 131/94, CoCa 12.3 (no calcium in TPN), Phos up to 3.3 post 15 mmol and increase in TPN Renal: CKD3 - SCr down 1.99 (BL 1.7-2), BUN 66 - UOP 0.3 ml/kg/hr, net +19.6L, off D5W 12/1, NS at 75/hr started 12/2 Pulm: stable on RA Cards: VSS, QTc 433ms - midodrine TID Hepatobil: hx cirrhosis Child Pugh C.  Ascites s/p paracentesis 11/29, 14L removed. LFTs / tbili / TG WNL (ILE 20% of kCal to provide more CHO, adjusted on 11/30 to get D5W off) Neuro: schizoaffective disorder, chronic pain - benztropine, Haldol, Zyprexa, PRN Fentanyl/Haldol ID: Zosyn for peritonitis (11/13 >> ) - afebrile, WBC down to 13.1 TPN Access: PICC 02/21/19 TPN start date: 02/18/19  Nutritional Goals (per RD rec on 11/23): 2450-2650 kCal, 125-145gm protein per day  Current Nutrition:  TPN Advanced to full liquid diet 12/2 - consuming up to 100% of meals  Plan:  Continue concentrated TPN at 80 ml/hr, providing 131g AA, 442g CHO and  51g ILE for a total of 2534 kCal, meeting 100% of patients needs Electrolytes in TPN: Na/Phos increased 12/2, reduce K slightly, increase Mag slightly, no Ca, max CL Daily multivitamin and trace elements in TPN Continue sensitive SSI Q4H.  Watch CBGs and may need to adjust TPN to provide less CHO. NS at 75 ml/hr per MD - watch volume status F/U AM labs, PO intake/diet advancement, continued GoC discussions (?meeting again on 12/3), watch plts  [name], PharmD, BCPS, [place] 03/01/2019, 8:25 AM

## 2019-03-01 NOTE — Progress Notes (Signed)
Follow up visit with [name]. Enjoys talking about his faith. Will continue to provide spiritual care  [place].

## 2019-03-01 NOTE — TOC Progression Note (Signed)
Transition of Care [facility]) - Progression Note    Patient Details  Name: [name] MRN: [id number] Date of Birth: March 29, 1961  Transition of Care [facility]) CM/SW [place], [place] Phone Number: 03/01/2019, 11:52 AM  Clinical Narrative:    CSW spoke with PMT provider.  At this time pt friends have elected to continue full care and are interested in SNF placement. Barriers to SNF placement include: Medicaid bed availability, current sitter, and TPN needs.   TOC team continues to follow.   Expected Discharge Plan: Skilled Nursing Facility Barriers to Discharge: Family Issues, Continued Medical Work up  Expected Discharge Plan and Services Expected Discharge Plan: [place] In-house Referral: Clinical Social Work Discharge Planning Services: CM Consult Living arrangements for the past 2 months: Apartment  Readmission Risk Interventions Readmission Risk Prevention Plan 01/11/2019  Transportation Screening Complete  Medication Review Press photographer) Complete  PCP or Specialist appointment within 3-5 days of discharge Complete  HRI or [place] Complete  SW Recovery Care/Counseling Consult Complete  [place] Not Applicable  Some recent data might be hidden

## 2019-03-01 NOTE — Progress Notes (Signed)
Nutrition Follow-up  DOCUMENTATION CODES:   Not applicable  INTERVENTION:   -TPN management per pharmacy  NUTRITION DIAGNOSIS:   Increased nutrient needs related to chronic illness(cirrhosis) as evidenced by estimated needs.  Ongoing  GOAL:   Patient will meet greater than or equal to 90% of their needs  Progressing   MONITOR:   Skin, Weight trends, Labs, I & O's, Other (Comment)(TPN)  REASON FOR ASSESSMENT:   Consult New TPN/TNA  ASSESSMENT:   58 y.o. male with medical history significant of cirrhosis with splenomegaly, esophageal varices, hepatitis C, adrenal insufficiency, hypothyroidism, schizoaffective disorder, PTSD, GI bleed, neuropathy, chronic kidney disease presented to emergency department due to worsening abdominal pain, distention associated with rectal bleeding for 1 day.  Work-up revealed bowel perforation/pneumoperitoneum.  11/13- s/p paracentesis (removed 2860 ml fluid) 11/17- PICC placed 11/18- s/p LLQ paracentesis (5 L clear yellow fluid removed), advanced to clear liquid diet, pt pulled PICC line 11/21- CT revealed large volume pneumoperitoneum with gas between liver and duodenum 11/22- TPN re-started, PICC placed 11/25- PICC replaced by IR 11/29- s/p RLQ paracentesis (14 L clear yellow fluid removed), advanced to clear liquid diet  12/2- advanced to full liquid diet  Per MD notes, pt with poor prognosis andpoorsurgical candidate.Palliative care team following for further goals of care discussions. Plan for another family meeting today to discuss goals of care.   Pt advanced to full liquid diet today; noted meal completion 90-100%.   Pt remains TPN dependent- receiving concentrated TPN at 80 ml/hr, providing 2534 kcals and 131 grams protein, meeting 100% of estimated kcal and protein needs.   Labs reviewed: Na: 131, K, Mg, and Phos WDL. CBGS: 140-149 (inpatient orders for glycemic control are 0-9 units insulin aspart every 4 hours).   Diet  Order:   Diet Order            Diet full liquid Room service appropriate? Yes; Fluid consistency: Thin  Diet effective now              EDUCATION NEEDS:   Not appropriate for education at this time  Skin:  Skin Assessment: Skin Integrity Issues: Skin Integrity Issues:: DTI DTI: rt heel Other: MASD to groin  Last BM:  02/28/19  Height:   Ht Readings from Last 1 Encounters:  02/09/19 6\' 6"  (1.981 m)    Weight:   Wt Readings from Last 1 Encounters:  03/01/19 113.5 kg    Ideal Body Weight:  97 kg  BMI:  Body mass index is 28.92 kg/m.  Estimated Nutritional Needs:   Kcal:  4481-8563  Protein:  125-145 grams  Fluid:  Per MD    Jaunita Mikels A. Jimmye Norman, RD, LDN, [place] Registered Dietitian II Certified Diabetes Care and Education Specialist Pager: [phone] After hours Pager: [phone]

## 2019-03-02 ENCOUNTER — Inpatient Hospital Stay (HOSPITAL_COMMUNITY): Payer: Medicaid Other

## 2019-03-02 LAB — BASIC METABOLIC PANEL
Anion gap: 7 (ref 5–15)
BUN: 74 mg/dL — ABNORMAL HIGH (ref 6–20)
CO2: 28 mmol/L (ref 22–32)
Calcium: 10.5 mg/dL — ABNORMAL HIGH (ref 8.9–10.3)
Chloride: 94 mmol/L — ABNORMAL LOW (ref 98–111)
Creatinine, Ser: 2.03 mg/dL — ABNORMAL HIGH (ref 0.61–1.24)
GFR calc Af Amer: 41 mL/min — ABNORMAL LOW (ref >60)
GFR calc non Af Amer: 35 mL/min — ABNORMAL LOW (ref >60)
Glucose, Bld: 145 mg/dL — ABNORMAL HIGH (ref 70–99)
Potassium: 4.4 mmol/L (ref 3.5–5.1)
Sodium: 129 mmol/L — ABNORMAL LOW (ref 135–145)

## 2019-03-02 LAB — CBC
HCT: 31.2 % — ABNORMAL LOW (ref 39.0–52.0)
Hemoglobin: 9.7 g/dL — ABNORMAL LOW (ref 13.0–17.0)
MCH: 30.2 pg (ref 26.0–34.0)
MCHC: 31.1 g/dL (ref 30.0–36.0)
MCV: 97.2 fL (ref 80.0–100.0)
Platelets: 83 10*3/uL — ABNORMAL LOW (ref 150–400)
RBC: 3.21 MIL/uL — ABNORMAL LOW (ref 4.22–5.81)
RDW: 19.6 % — ABNORMAL HIGH (ref 11.5–15.5)
WBC: 16.6 10*3/uL — ABNORMAL HIGH (ref 4.0–10.5)
nRBC: 0 % (ref 0.0–0.2)

## 2019-03-02 LAB — GLUCOSE, CAPILLARY
Glucose-Capillary: 139 mg/dL — ABNORMAL HIGH (ref 70–99)
Glucose-Capillary: 142 mg/dL — ABNORMAL HIGH (ref 70–99)
Glucose-Capillary: 146 mg/dL — ABNORMAL HIGH (ref 70–99)
Glucose-Capillary: 147 mg/dL — ABNORMAL HIGH (ref 70–99)
Glucose-Capillary: 150 mg/dL — ABNORMAL HIGH (ref 70–99)
Glucose-Capillary: 166 mg/dL — ABNORMAL HIGH (ref 70–99)
Glucose-Capillary: 167 mg/dL — ABNORMAL HIGH (ref 70–99)

## 2019-03-02 LAB — PHOSPHORUS: Phosphorus: 3.4 mg/dL (ref 2.5–4.6)

## 2019-03-02 MED ORDER — TRACE MINERALS CU-MN-SE-ZN 300-55-60-3000 MCG/ML IV SOLN
INTRAVENOUS | Status: AC
  Administered 2019-03-02: 18:00:00 via INTRAVENOUS
  Filled 2019-03-02: qty 870.4

## 2019-03-02 NOTE — TOC Progression Note (Addendum)
Transition of Care [facility]) - Progression Note    Patient Details  Name: [name] MRN: [id number] Date of Birth: October 20, 1960  Transition of Care [facility]) CM/SW [place], [place] Phone Number: 03/02/2019, 11:09 AM  Clinical Narrative:    At this time pt friends have elected to continue full care and are interested in SNF placement. Barriers to SNF placement include: Medicaid bed availability, current sitter, and TPN needs.   TOC team continues to follow. CSW provided update to APS worker through [facility] that is currently following pt Anette Riedel Tim Lair [phone])   Expected Discharge Plan: [place] Barriers to Discharge: Continued Medical Work up, Requiring sitter/restraints, Other (comment)(pt on TPN, sitter)  Expected Discharge Plan and Services Expected Discharge Plan: [place] In-house Referral: Clinical Social Work Discharge Planning Services: CM Consult Living arrangements for the past 2 months: Apartment  Readmission Risk Interventions Readmission Risk Prevention Plan 01/11/2019  Transportation Screening Complete  Medication Review Press photographer) Complete  PCP or Specialist appointment within 3-5 days of discharge Complete  HRI or [place] Complete  SW Recovery Care/Counseling Consult Complete  [place] Not Applicable  Some recent data might be hidden

## 2019-03-02 NOTE — Progress Notes (Signed)
PHARMACY - ADULT TOTAL PARENTERAL NUTRITION CONSULT NOTE  Indication: Stomach perforation on bowel rest  Patient Measurements: Height: 6\' 6"  (198.1 cm) Weight: 253 lb 15.5 oz (115.2 kg) IBW/kg (Calculated) : 91.4 TPN AdjBW (KG): 107.4 Body mass index is 29.35 kg/m. Usual Weight: ~100kg  Assessment:  22 YOM presenting 11/14 with abdominal pain/distention and rectal bleeding, found with bowel perforation tx with medical management and bowel rest.  Paracentesis 11/13. Started liquids on 11/18-19 now back on bowel rest and pharmacy consulted to start TPN.  Little PO intake since admission and prealbumin <5.    GI: worsened CT 11/21 pneumoperitoneum - gas between liver/duodenum. Perforation of distal stomach, GIB. Poor surgical candidate. Umbilical hernia note-stable.   Prealbumin up to 5.2, BM x1.  PPI IV BID C/o abd discomfort 12/3 Endo: A1c 3.9% - CBGs controlled on SSI.  Hydrocortisone IV for adrenal insufficiency (prednisone PTA).  Insulin requirements in the past 24 hours: 11 units sSSI  Lytes: Na down to 129, Cl improved at 94, Corrected Ca up 12.5 (no calcium in TPN), Phos up to 3.4 Renal: CKD3 - SCr up 2.03 (BL 1.7-2), BUN up 74 - UOP 0.3 ml/kg/hr, net +22L, off IVF Pulm: stable on RA Cards: VSS, QTc 481ms - midodrine TID Hepatobil: hx cirrhosis Child Pugh C.  Ascites s/p paracentesis 11/29, 14L removed. LFTs / tbili / TG within normal limits (ILE 20% of kCal to provide more CHO, adjusted on 11/30 to get D5W off) Neuro: schizoaffective disorder, chronic pain - benztropine, Haldol, Zyprexa, PRN Fentanyl/Haldol ID: Zosyn for peritonitis (11/13 >> ) - afebrile, WBC down to 13.1 TPN Access: PICC 02/21/19 TPN start date: 02/18/19  Nutritional Goals (per RD rec on 12/3): 2450-2650 kCal, 125-145gm protein per day  Current Nutrition:  TPN Advanced to full liquid diet 12/2 - consuming up to 90-100% of meals  Plan:  Continue concentrated TPN at 80 ml/hr, providing 131g AA, 442g CHO  and 51g ILE for a total of 2534 kCal, meeting 100% of patients needs Electrolytes in TPN: increase Na again on 12/4, continue current Phos (last increased 12/2), continue current K and Mg, no Ca, max Chloride. Daily multivitamin and trace elements in TPN Continue sensitive SSI Q4H.   F/U AM labs, PO intake/diet advancement, watch plts  Sloan Leiter, PharmD, BCPS, BCCCP Clinical Pharmacist Please refer to [facility] for [place] numbers 03/02/2019, 10:30 AM

## 2019-03-02 NOTE — Plan of Care (Signed)
  Problem: Clinical Measurements: Goal: Will remain free from infection Outcome: Progressing Goal: Respiratory complications will improve Outcome: Progressing Goal: Cardiovascular complication will be avoided Outcome: Progressing   Problem: Nutrition: Goal: Adequate nutrition will be maintained Outcome: Progressing   

## 2019-03-02 NOTE — Progress Notes (Addendum)
PROGRESS NOTE  [name] ZOX:[id number] DOB: 04/24/1960 DOA: 02/08/2019 PCP: Fleet ContrasAvbuere, Edwin, MD  HPI/Recap of past 3624 hours:  58 year old with history of cirrhosis with splenomegaly, hepatitis C, esophageal varices, adrenal insufficiency, hypothyroidism, schizoaffective disorder, PTSD, CKD stage IIIa initially brought to the hospital for worsening abdominal pain, distention and rectal bleeding.  Work-up showed perforated bowel and pneumoperitoneum.  General surgery recommended nonoperative management with IV antibiotics and bowel rest.  Due to worsening of the pain, repeat CT of the abdomen pelvis showed free intra-abdominal fluid and pneumoperitoneum.  There is also perforated bowel near pyloric or stomach.  Underwent UGI on 11/16 showing focal contained perforation in distal stomach.  Palliative care team is following. Psychiatry evaluate the patient and stated does not have capacity to make his own decisions.  03/02/19: Patient was seen and examined at bedside this morning.  No acute events overnight.  Worsening white count up to 16,000 on IV Zosyn.  Repeated CT abdomen and pelvis no significant changes.   Assessment/Plan: Principal Problem:   Bowel perforation (HCC) Active Problems:   Schizoaffective disorder (HCC)   PTSD (post-traumatic stress disorder)   History of adrenal insufficiency   GI bleed   Cirrhosis (HCC)   Esophageal varices in cirrhosis (HCC)   CKD (chronic kidney disease) stage 3, GFR 30-59 ml/min   Abdominal pain   Bleeding per rectum   Septic shock (HCC)   Pneumoperitoneum   Central venous catheter in place   Goals of care, counseling/discussion   Palliative care by specialist   AKI (acute kidney injury) (HCC)   Palliative care encounter   Acute renal failure with acute tubular necrosis superimposed on stage 3b chronic kidney disease (HCC)   Pressure injury of skin   Advanced care planning/counseling discussion   Abdominal distention  Severe septic shock  secondary to perforated bowel/pneumoperitoneum and gastric perforation -Seen by surgical services-recommend conservative management and IV antibiotics-Zosyn, given high risk of mortality in a poor surgical candidate.  Currently on full liquid diet and TPN. Seen by palliative care service. -Upper GI series that showed gastric perforation -Repeat abdominal x-ray 12/2-shows ascites. CT abd pelvis 03/02/19 showed 1. Large volume ascites with large volume pneumoperitoneum predominantly in the anterior peritoneal cavity, not substantially changed since 02/17/2019 CT. Refractory leukocytosis on zosyn Afebrile Repeat CBC with diff in the am  Resolving Acute metabolic encephalopathy likely multifactorial secondary to sepsis versus uncontrolled underlying psychiatric illness. Reorient as needed One-to-one in place for patient's own safety Responding well to zyprexa and haldol  Decompensated hepatitis C liver cirrhosis with ascites Avoid hepatotoxic agents Last paracentesis was on 02/25/2019 by interventional radiology with 14 L of clear yellow fluid removed.  Hypervolemic hyponatremia Sodium drop from 131 to 129 Start fluid restriction. Daily BMPs  Hypotension Blood pressure stable on midodrine, continue Maintain MAP greater than 65.  Continue to monitor vital signs.  Thrombocytopenia likely in the setting of decompensated hepatitis C liver cirrhosis Platelet count stable 83K. Continue supportive care.   Paracentesis as needed.  Will need to follow-up appointment GI outpatient.  Acute blood loss anemia/gastric and duodenal ulcer -EGD in October 2020 showed grade D esophagitis with nonbleeding gastric ulcer, duodenal ulcer and hiatal hernia. -Continue PPI  Hyperglycemia, intermittently Last hemoglobin A1c 3.9 on 02/09/2019 Hold off insulin to avoid hypoglycemia  CKD stage IIIa -Baseline creatinine 1.8.   Creatinine is trending up Continue to avoid nephrotoxins Renal ultrasound  suggestive of chronic medical renal disease.  There is consideration of HRS therefore midodrine.  History of PTSD/schizoaffective disorder Seen by psychiatry patient deemed not to have capacity to make medical decision.  Physical debility/ambulatory dysfunction PT recommended SNF CSW assisting with placement Continue fall precautions  DVT prophylaxis: SCDs due to worsening thrombocytopenia. Code Status: DNR Family Communication: None at bedside  Disposition Plan:  Patient is currently not appropriate for discharge due to ongoing sepsis physiology.  Consultants:   Palliative care  Antimicrobials:   IV Zosyn   Objective: Vitals:   03/01/19 1511 03/01/19 2017 03/02/19 0346 03/02/19 0453  BP: 119/74 119/79 125/74   Pulse: 96 99 84   Resp: 18 18 19    Temp: 98.2 F (36.8 C) 98.5 F (36.9 C) 97.6 F (36.4 C)   TempSrc: Oral     SpO2: 96% 97% 97%   Weight:    115.2 kg  Height:        Intake/Output Summary (Last 24 hours) at 03/02/2019 1502 Last data filed at 03/02/2019 1342 Gross per 24 hour  Intake 2304.5 ml  Output 925 ml  Net 1379.5 ml   Filed Weights   02/27/19 0537 03/01/19 0527 03/02/19 0453  Weight: 110.5 kg 113.5 kg 115.2 kg    Exam:   General: 58 y.o. year-old male well-developed well-nourished in no acute distress.  Alert and interactive.    Cardiovascular: Regular rate and rhythm no rubs or gallops.  Respiratory: Mild rales at bases.  No wheezing noted.    Abdomen: Distended.  Nontender.  Musculoskeletal: Dependent edema all throughout.    Psychiatry: Mood is appropriate for condition and setting.   Data Reviewed: CBC: Recent Labs  Lab 02/26/19 0400 02/27/19 0347 02/28/19 0359 03/02/19 0506  WBC 12.6* 16.0* 13.1* 16.6*  NEUTROABS 10.8*  --  11.8*  --   HGB 8.9* 9.5* 8.6* 9.7*  HCT 28.8* 30.1* 26.7* 31.2*  MCV 97.6 96.2 95.0 97.2  PLT 99* 98* 69* 83*   Basic Metabolic Panel: Recent Labs  Lab 02/24/19 0313 02/26/19 0400  02/27/19 0347 02/28/19 0359 03/01/19 0341 03/02/19 0506  NA 141 137 131* 129* 131* 129*  K 4.1 4.3 4.2 4.7 4.7 4.4  CL 107 100 97* 92* 94* 94*  CO2 26 29 28 30 29 28   GLUCOSE 135* 139* 127* 149* 133* 145*  BUN 64* 69* 68* 66* 68* 74*  CREATININE 2.25* 2.19* 2.03* 2.06* 1.99* 2.03*  CALCIUM 10.7* 10.3 10.0 9.9 10.3 10.5*  MG 1.8 1.9  --   --  1.8  --   PHOS 2.3* 2.9  --  2.2* 3.3 3.4   GFR: Estimated Creatinine Clearance: 56.6 mL/min (A) (by C-G formula based on SCr of 2.03 mg/dL (H)). Liver Function Tests: Recent Labs  Lab 02/26/19 0400 02/27/19 0347 03/01/19 0341  AST 19 32 27  ALT 11 19 19   ALKPHOS 61 63 77  BILITOT 1.6* 1.5* 1.0  PROT 5.3* 5.7* 5.7*  ALBUMIN 1.6* 1.6* 1.5*   No results for input(s): LIPASE, AMYLASE in the last 168 hours. No results for input(s): AMMONIA in the last 168 hours. Coagulation Profile: No results for input(s): INR, PROTIME in the last 168 hours. Cardiac Enzymes: No results for input(s): CKTOTAL, CKMB, CKMBINDEX, TROPONINI in the last 168 hours. BNP (last 3 results) No results for input(s): PROBNP in the last 8760 hours. HbA1C: No results for input(s): HGBA1C in the last 72 hours. CBG: Recent Labs  Lab 03/01/19 2013 03/01/19 2359 03/02/19 0345 03/02/19 0716 03/02/19 1110  GLUCAP 171* 142* 139* 150* 166*   Lipid Profile: No results  for input(s): CHOL, HDL, LDLCALC, TRIG, CHOLHDL, LDLDIRECT in the last 72 hours. Thyroid Function Tests: No results for input(s): TSH, T4TOTAL, FREET4, T3FREE, THYROIDAB in the last 72 hours. Anemia Panel: No results for input(s): VITAMINB12, FOLATE, FERRITIN, TIBC, IRON, RETICCTPCT in the last 72 hours. Urine analysis:    Component Value Date/Time   COLORURINE YELLOW 02/18/2019 1839   APPEARANCEUR HAZY (A) 02/18/2019 1839   LABSPEC 1.019 02/18/2019 1839   PHURINE 5.0 02/18/2019 1839   GLUCOSEU NEGATIVE 02/18/2019 1839   HGBUR NEGATIVE 02/18/2019 1839   BILIRUBINUR NEGATIVE 02/18/2019 1839    KETONESUR NEGATIVE 02/18/2019 1839   PROTEINUR NEGATIVE 02/18/2019 1839   UROBILINOGEN 1.0 01/19/2013 0015   NITRITE NEGATIVE 02/18/2019 1839   LEUKOCYTESUR NEGATIVE 02/18/2019 1839   Sepsis Labs: @LABRCNTIP (procalcitonin:4,lacticidven:4)  )No results found for this or any previous visit (from the past 240 hour(s)).    Studies: Ct Abdomen Pelvis Wo Contrast  Result Date: 03/02/2019 CLINICAL DATA:  Inpatient. Worsening abdominal pain and distention. Known distal gastric perforation and pneumoperitoneum. GI bleed. Cirrhosis. EXAM: CT ABDOMEN AND PELVIS WITHOUT CONTRAST TECHNIQUE: Multidetector CT imaging of the abdomen and pelvis was performed following the standard protocol without IV contrast. COMPARISON:  02/17/2019 CT abdomen/pelvis. FINDINGS: Lower chest: Small dependent bilateral pleural effusions, slightly increased bilaterally. Mild dependent right lung base atelectasis. Dense left lower lobe consolidation with air bronchograms, similar. Stable lower esophageal varices. Symmetric stable moderate gynecomastia. Hepatobiliary: Atrophic liver with diffusely irregular liver surface, compatible with cirrhosis. No liver masses. Layering sludge with possible tiny calcified gallstones in the nondistended gallbladder with no gallbladder wall thickening. No biliary ductal dilatation. Pancreas: Normal, with no mass or duct dilation. Spleen: Moderate splenomegaly. Craniocaudal splenic length 19.0 cm. Stable. No splenic mass. Adrenals/Urinary Tract: Normal adrenals. No renal stones. No hydronephrosis. Simple 3.3 cm interpolar right renal cyst. Simple 1.5 cm interpolar left renal cyst. Normal bladder. Stomach/Bowel: Nondistended stomach. Wall thickening in the gastric antrum, unchanged. Irregularity of anterior gastric antrum luminal contour with suggestion of tiny outpouching (series 3/image 46), which does not definitely communicate with large volume anterior peritoneal cavity free air and ascites. Normal  caliber small bowel with no small bowel wall thickening. Candidate normal appendix. Scattered fluid levels in the large bowel with no large bowel wall thickening, diverticulosis or acute pericolonic fat stranding. Vascular/Lymphatic: Atherosclerotic nonaneurysmal abdominal aorta. Stable splenic hilar varices. No pathologically enlarged lymph nodes in the abdomen or pelvis. Reproductive: Normal size prostate. Other: Large volume ascites with large volume pneumoperitoneum, not substantially changed from prior CT. Stable small umbilical hernia containing gas and fluid. Anasarca is similar. Musculoskeletal: Chronic severe right hip deformity with surrounding hypertrophic ossific change. Mild thoracolumbar spondylosis. No new focal osseous lesions. IMPRESSION: 1. Large volume ascites with large volume pneumoperitoneum predominantly in the anterior peritoneal cavity, not substantially changed since 02/17/2019 CT. 2. Persistent nonspecific wall thickening in the gastric antrum. Irregular luminal contour in the anterior gastric antrum compatible with contained perforation seen on 02/12/2019 upper GI study. No obvious direct communication of the gastric lumen with the peritoneal cavity on this limited noncontrast CT study. 3. Cirrhosis.  Stable splenic hilar and lower esophageal varices. 4. Small dependent bilateral pleural effusions, slightly increased bilaterally. Anasarca. 5. Dense left lung base consolidation with air bronchograms, favor atelectasis, with a component of pneumonia not excluded. 6. Fluid levels throughout the large bowel indicative of a nonspecific malabsorptive state. 7.  Aortic Atherosclerosis (ICD10-I70.0). Electronically Signed   By: Ilona Sorrel M.D.   On: 03/02/2019 09:23  Scheduled Meds:  benztropine mesylate  0.5 mg Intramuscular Daily   Chlorhexidine Gluconate Cloth  6 each Topical Daily   haloperidol  2 mg Oral Daily   hydrocortisone sod succinate (SOLU-CORTEF) inj  25 mg Intravenous  Q12H   midodrine  10 mg Oral TID WC   Muscle Rub   Topical BID   OLANZapine zydis  5 mg Oral QHS   pantoprazole (PROTONIX) IV  40 mg Intravenous Q12H   sodium chloride flush  10-40 mL Intracatheter Q12H    Continuous Infusions:  sodium chloride     sodium chloride Stopped (02/12/19 1737)   sodium chloride Stopped (02/19/19 1422)   piperacillin-tazobactam (ZOSYN)  IV 3.375 g (03/02/19 1033)   TPN ADULT (ION) 80 mL/hr at 03/02/19 0538   TPN ADULT (ION)       LOS: 22 days     Darlin Drop, MD [facility] Pager [phone]  If 7PM-7AM, please contact night-coverage [email or web address] Password [email or web address] 03/02/2019, 3:02 PM

## 2019-03-03 LAB — GLUCOSE, CAPILLARY
Glucose-Capillary: 142 mg/dL — ABNORMAL HIGH (ref 70–99)
Glucose-Capillary: 147 mg/dL — ABNORMAL HIGH (ref 70–99)
Glucose-Capillary: 159 mg/dL — ABNORMAL HIGH (ref 70–99)
Glucose-Capillary: 162 mg/dL — ABNORMAL HIGH (ref 70–99)
Glucose-Capillary: 174 mg/dL — ABNORMAL HIGH (ref 70–99)
Glucose-Capillary: 188 mg/dL — ABNORMAL HIGH (ref 70–99)

## 2019-03-03 MED ORDER — HYDROMORPHONE HCL 1 MG/ML IJ SOLN
1.0000 mg | Freq: Once | INTRAMUSCULAR | Status: AC
  Administered 2019-03-04: 1 mg via INTRAVENOUS
  Filled 2019-03-03: qty 1

## 2019-03-03 MED ORDER — TRACE MINERALS CU-MN-SE-ZN 300-55-60-3000 MCG/ML IV SOLN
INTRAVENOUS | Status: AC
  Administered 2019-03-03: 18:00:00 via INTRAVENOUS
  Filled 2019-03-03: qty 870.4

## 2019-03-03 NOTE — Progress Notes (Signed)
PROGRESS NOTE  [name] JOA:[id number] DOB: 12/19/1960 DOA: 02/08/2019 PCP: Fleet ContrasAvbuere, Edwin, MD  HPI/Recap of past 1824 hours: 58 year old male with cirrhosis hepatomegaly splenomegaly hepatitis C esophageal varices adrenal insufficiency schizophrenia hypothyroidism PTSD chronic kidney disease stage III they who was brought to the hospital for abdominal pain with distention and rectal bleeding.  He was found to have perforation at the bowel near pyloric or stomach he underwent upper GI on November 16 showing focal contained perforated the in the distal stomach.  Surgery was consulted.  He was but he was declared a nonoperative candidate and recommended to continue continue nonoperative management with antibiotics and bowel rest.  Patient is currently on Zosyn his white count is still elevated.  And CT scan of the abdomen and pelvis did not show any significant change.  Palliative care team is following psychiatry also has evaluated patient and stated that he does not have capacity to make his own decision  Subjective: Patient seen and examined at bedside he was sleepy there is a sitter at bedside when I try to examine him he told me to put his covers back up.  The seated stated that he has been a little irritable and demanding that he has 3 Coke bottles on his table and the Coke bottle should be aligned because he woke up and found that it was out of place and it really upset him for things to be out of place.   Assessment/Plan: Principal Problem:   Bowel perforation (HCC) Active Problems:   Schizoaffective disorder (HCC)   PTSD (post-traumatic stress disorder)   History of adrenal insufficiency   GI bleed   Cirrhosis (HCC)   Esophageal varices in cirrhosis (HCC)   CKD (chronic kidney disease) stage 3, GFR 30-59 ml/min   Abdominal pain   Bleeding per rectum   Septic shock (HCC)   Pneumoperitoneum   Central venous catheter in place   Goals of care, counseling/discussion   Palliative care  by specialist   AKI (acute kidney injury) (HCC)   Palliative care encounter   Acute renal failure with acute tubular necrosis superimposed on stage 3b chronic kidney disease (HCC)   Pressure injury of skin   Advanced care planning/counseling discussion   Abdominal distention   #1 severe septic shock secondary to perforated bowel/pneumoperitoneum and gastric perforation patient is not a good surgical candidate surgery service recommended conservative management with IV antibiotics he will continue his Zosyn.  He is currently on full liquids and TPN patient has been seen by palliative care service.  2.  Leukocytosis secondary to #1 continue to monitor  3.  Resolving acute metabolic encephalopathy likely multifactorial secondary to sepsis.  Patient also likely has underlying psychiatric illness and he is responding well to Zyprexa and Haldol he still gets agitated and upset when things are not arranged where he wants it.  4.  Hypovolemic hyponatremia sodium is still low continue IV fluid restriction and monitor  5.  Decompensated hip appetite cirrhosis with  Code Status: DNR  Severity of Illness: The appropriate patient status for this patient is INPATIENT. Inpatient status is judged to be reasonable and necessary in order to provide the required intensity of service to ensure the patient's safety. The patient's presenting symptoms, physical exam findings, and initial radiographic and laboratory data in the context of their chronic comorbidities is felt to place them at high risk for further clinical deterioration. Furthermore, it is not anticipated that the patient will be medically stable for discharge from the  hospital within 2 midnights of admission. The following factors support the patient status of inpatient.    * I certify that at the point of admission it is my clinical judgment that the patient will require inpatient hospital care spanning beyond 2 midnights from the point of  admission due to high intensity of service, high risk for further deterioration and high frequency of surveillance required.*Patient is still on IV antibiotics his white count is still elevated, his sodium is still low but improved    Family Communication: None at bedside  Disposition Plan: PT recommended SNF.  Social worker is assisting with when stable but he is not ready for discharge at this time due to ongoing sepsis treatment and psychosocial factors   Consultants:  Psychiatry  Palliative care  Procedures:  EGD  Antimicrobials:  IV Zosyn  DVT prophylaxis: SCD   Objective: Vitals:   03/02/19 0453 03/02/19 1400 03/02/19 2013 03/03/19 0411  BP:  119/71 134/78 119/73  Pulse:  86 96 97  Resp:  17 18 16   Temp:  98.1 F (36.7 C) 98.1 F (36.7 C) 98.3 F (36.8 C)  TempSrc:  Oral Oral Oral  SpO2:  98% 96% 96%  Weight: 115.2 kg     Height:        Intake/Output Summary (Last 24 hours) at 03/03/2019 0857 Last data filed at 03/03/2019 14/07/2018 Gross per 24 hour  Intake 2138.29 ml  Output 375 ml  Net 1763.29 ml   Filed Weights   02/27/19 0537 03/01/19 0527 03/02/19 0453  Weight: 110.5 kg 113.5 kg 115.2 kg   Body mass index is 29.35 kg/m.  Exam:   General: 58 y.o. year-old male well developed well nourished in no acute distress.  Alert and oriented sleeping woke up to tell me to leave him alone  ,  Psychiatry: Mood is irritable Sitter is at bedside   Data Reviewed: CBC: Recent Labs  Lab 02/26/19 0400 02/27/19 0347 02/28/19 0359 03/02/19 0506  WBC 12.6* 16.0* 13.1* 16.6*  NEUTROABS 10.8*  --  11.8*  --   HGB 8.9* 9.5* 8.6* 9.7*  HCT 28.8* 30.1* 26.7* 31.2*  MCV 97.6 96.2 95.0 97.2  PLT 99* 98* 69* 83*   Basic Metabolic Panel: Recent Labs  Lab 02/26/19 0400 02/27/19 0347 02/28/19 0359 03/01/19 0341 03/02/19 0506  NA 137 131* 129* 131* 129*  K 4.3 4.2 4.7 4.7 4.4  CL 100 97* 92* 94* 94*  CO2 29 28 30 29 28   GLUCOSE 139* 127* 149* 133* 145*    BUN 69* 68* 66* 68* 74*  CREATININE 2.19* 2.03* 2.06* 1.99* 2.03*  CALCIUM 10.3 10.0 9.9 10.3 10.5*  MG 1.9  --   --  1.8  --   PHOS 2.9  --  2.2* 3.3 3.4   GFR: Estimated Creatinine Clearance: 56.6 mL/min (A) (by C-G formula based on SCr of 2.03 mg/dL (H)). Liver Function Tests: Recent Labs  Lab 02/26/19 0400 02/27/19 0347 03/01/19 0341  AST 19 32 27  ALT 11 19 19   ALKPHOS 61 63 77  BILITOT 1.6* 1.5* 1.0  PROT 5.3* 5.7* 5.7*  ALBUMIN 1.6* 1.6* 1.5*   No results for input(s): LIPASE, AMYLASE in the last 168 hours. No results for input(s): AMMONIA in the last 168 hours. Coagulation Profile: No results for input(s): INR, PROTIME in the last 168 hours. Cardiac Enzymes: No results for input(s): CKTOTAL, CKMB, CKMBINDEX, TROPONINI in the last 168 hours. BNP (last 3 results) No results for input(s): PROBNP in  the last 8760 hours. HbA1C: No results for input(s): HGBA1C in the last 72 hours. CBG: Recent Labs  Lab 03/02/19 1717 03/02/19 2011 03/02/19 2337 03/03/19 0408 03/03/19 0727  GLUCAP 147* 167* 146* 142* 162*   Lipid Profile: No results for input(s): CHOL, HDL, LDLCALC, TRIG, CHOLHDL, LDLDIRECT in the last 72 hours. Thyroid Function Tests: No results for input(s): TSH, T4TOTAL, FREET4, T3FREE, THYROIDAB in the last 72 hours. Anemia Panel: No results for input(s): VITAMINB12, FOLATE, FERRITIN, TIBC, IRON, RETICCTPCT in the last 72 hours. Urine analysis:    Component Value Date/Time   COLORURINE YELLOW 02/18/2019 1839   APPEARANCEUR HAZY (A) 02/18/2019 1839   LABSPEC 1.019 02/18/2019 1839   PHURINE 5.0 02/18/2019 1839   GLUCOSEU NEGATIVE 02/18/2019 1839   HGBUR NEGATIVE 02/18/2019 1839   BILIRUBINUR NEGATIVE 02/18/2019 1839   KETONESUR NEGATIVE 02/18/2019 1839   PROTEINUR NEGATIVE 02/18/2019 1839   UROBILINOGEN 1.0 01/19/2013 0015   NITRITE NEGATIVE 02/18/2019 1839   LEUKOCYTESUR NEGATIVE 02/18/2019 1839   Sepsis  Labs: @LABRCNTIP (procalcitonin:4,lacticidven:4)  )No results found for this or any previous visit (from the past 240 hour(s)).    Studies: Ct Abdomen Pelvis Wo Contrast  Result Date: 03/02/2019 CLINICAL DATA:  Inpatient. Worsening abdominal pain and distention. Known distal gastric perforation and pneumoperitoneum. GI bleed. Cirrhosis. EXAM: CT ABDOMEN AND PELVIS WITHOUT CONTRAST TECHNIQUE: Multidetector CT imaging of the abdomen and pelvis was performed following the standard protocol without IV contrast. COMPARISON:  02/17/2019 CT abdomen/pelvis. FINDINGS: Lower chest: Small dependent bilateral pleural effusions, slightly increased bilaterally. Mild dependent right lung base atelectasis. Dense left lower lobe consolidation with air bronchograms, similar. Stable lower esophageal varices. Symmetric stable moderate gynecomastia. Hepatobiliary: Atrophic liver with diffusely irregular liver surface, compatible with cirrhosis. No liver masses. Layering sludge with possible tiny calcified gallstones in the nondistended gallbladder with no gallbladder wall thickening. No biliary ductal dilatation. Pancreas: Normal, with no mass or duct dilation. Spleen: Moderate splenomegaly. Craniocaudal splenic length 19.0 cm. Stable. No splenic mass. Adrenals/Urinary Tract: Normal adrenals. No renal stones. No hydronephrosis. Simple 3.3 cm interpolar right renal cyst. Simple 1.5 cm interpolar left renal cyst. Normal bladder. Stomach/Bowel: Nondistended stomach. Wall thickening in the gastric antrum, unchanged. Irregularity of anterior gastric antrum luminal contour with suggestion of tiny outpouching (series 3/image 46), which does not definitely communicate with large volume anterior peritoneal cavity free air and ascites. Normal caliber small bowel with no small bowel wall thickening. Candidate normal appendix. Scattered fluid levels in the large bowel with no large bowel wall thickening, diverticulosis or acute pericolonic  fat stranding. Vascular/Lymphatic: Atherosclerotic nonaneurysmal abdominal aorta. Stable splenic hilar varices. No pathologically enlarged lymph nodes in the abdomen or pelvis. Reproductive: Normal size prostate. Other: Large volume ascites with large volume pneumoperitoneum, not substantially changed from prior CT. Stable small umbilical hernia containing gas and fluid. Anasarca is similar. Musculoskeletal: Chronic severe right hip deformity with surrounding hypertrophic ossific change. Mild thoracolumbar spondylosis. No new focal osseous lesions. IMPRESSION: 1. Large volume ascites with large volume pneumoperitoneum predominantly in the anterior peritoneal cavity, not substantially changed since 02/17/2019 CT. 2. Persistent nonspecific wall thickening in the gastric antrum. Irregular luminal contour in the anterior gastric antrum compatible with contained perforation seen on 02/12/2019 upper GI study. No obvious direct communication of the gastric lumen with the peritoneal cavity on this limited noncontrast CT study. 3. Cirrhosis.  Stable splenic hilar and lower esophageal varices. 4. Small dependent bilateral pleural effusions, slightly increased bilaterally. Anasarca. 5. Dense left lung base consolidation with air  bronchograms, favor atelectasis, with a component of pneumonia not excluded. 6. Fluid levels throughout the large bowel indicative of a nonspecific malabsorptive state. 7.  Aortic Atherosclerosis (ICD10-I70.0). Electronically Signed   By: Delbert Phenix M.D.   On: 03/02/2019 09:23    Scheduled Meds:  benztropine mesylate  0.5 mg Intramuscular Daily   Chlorhexidine Gluconate Cloth  6 each Topical Daily   haloperidol  2 mg Oral Daily   hydrocortisone sod succinate (SOLU-CORTEF) inj  25 mg Intravenous Q12H   midodrine  10 mg Oral TID WC   Muscle Rub   Topical BID   OLANZapine zydis  5 mg Oral QHS   pantoprazole (PROTONIX) IV  40 mg Intravenous Q12H   sodium chloride flush  10-40 mL  Intracatheter Q12H    Continuous Infusions:  sodium chloride     sodium chloride Stopped (02/12/19 1737)   sodium chloride Stopped (02/19/19 1422)   piperacillin-tazobactam (ZOSYN)  IV 3.375 g (03/03/19 0209)   TPN ADULT (ION) 80 mL/hr at 03/02/19 1742     LOS: 23 days     Myrtie Neither, MD [facility]  To reach me or the doctor on call, go to: [email or web address] Password [facility]  03/03/2019, 8:57 AM

## 2019-03-03 NOTE — Progress Notes (Signed)
PHARMACY - ADULT TOTAL PARENTERAL NUTRITION CONSULT NOTE  Indication: Stomach perforation on bowel rest  Patient Measurements: Height: 6\' 6"  (198.1 cm) Weight: 253 lb 15.5 oz (115.2 kg) IBW/kg (Calculated) : 91.4 TPN AdjBW (KG): 107.4 Body mass index is 29.35 kg/m. Usual Weight: ~100kg  Assessment:  31 YOM presenting 11/14 with abdominal pain/distention and rectal bleeding, found with bowel perforation tx with medical management and bowel rest.  Paracentesis 11/13. Started liquids on 11/18-19 now back on bowel rest and pharmacy consulted to start TPN.  Little PO intake since admission and prealbumin <5.    GI: worsened CT 11/21 pneumoperitoneum - gas between liver/duodenum. Perforation of distal stomach, GIB. Poor surgical candidate. Umbilical hernia note-stable.   Prealbumin up to 5.2, BM x3.  PPI IV BID C/o abd discomfort 12/3 Endo: A1c 3.9% - CBGs <180.  Hydrocortisone IV for adrenal insufficiency (prednisone PTA).  Insulin requirements in the past 24 hours: 0 Lytes: Na down to 129, Cl improved at 94, Corrected Ca up 12.5 (no calcium in TPN), Phos up to 3.4 Renal: CKD3 - SCr up 2.03 (BL 1.7-2), BUN up 74 - UOP improving (not all volume charted), off IVF Pulm: stable on RA Cards: VSS, QTc 434ms - midodrine TID Hepatobil: hx cirrhosis Child Pugh C.  Ascites s/p paracentesis 11/29, 14L removed. LFTs / tbili / TG within normal limits (ILE 20% of kCal to provide more CHO, adjusted on 11/30 to get D5W off) Neuro: schizoaffective disorder, chronic pain - benztropine, Haldol, Zyprexa, PRN Fentanyl/Haldol ID: Zosyn for peritonitis (11/13 >> ) - afebrile, WBC 16.6. TPN Access: PICC 02/21/19 TPN start date: 02/18/19  Nutritional Goals (per RD rec on 12/3): 0947-0962 kCal, 125-145gm protein per day  Current Nutrition:  TPN Advanced to full liquid diet 12/2 - consuming up to 85-100% of meals  Plan:  Continue concentrated TPN at 80 ml/hr, providing 131g AA, 442g CHO and 51g ILE for a  total of 2534 kCal, meeting 100% of patients needs Electrolytes in TPN: increased Na again on 12/4, continue current Phos (last increased 12/2), continue current K and Mg, no Ca, max Chloride. Daily multivitamin and trace elements in TPN Sensitive SSI discontinued by MD on 12/3 - monitor CBGs.  F/U TPN labs, PO intake/diet advancement, watch plts  Sloan Leiter, PharmD, BCPS, BCCCP Clinical Pharmacist Please refer to [facility] for [place] numbers 03/03/2019, 9:18 AM

## 2019-03-03 NOTE — Progress Notes (Signed)
Pt refused morning midodrine dose. Pt repeating "just leave me alone". Cristal Deer, MD paged. Awaiting MD response. Will continue to monitor.

## 2019-03-03 NOTE — Plan of Care (Signed)
  Problem: Clinical Measurements: Goal: Will remain free from infection Outcome: Progressing Goal: Respiratory complications will improve Outcome: Progressing   Problem: Elimination: Goal: Will not experience complications related to urinary retention Outcome: Progressing   

## 2019-03-03 NOTE — Progress Notes (Signed)
Pt has been on call light about every 15 mins this shift.. Just had lg incontinent loose stool. Pt very agitated and refusing to let staff clean him up. Pt was combative upon staff  getting close to bed. After about 10 mins he let staff barely clean him. Will continue to monitor.

## 2019-03-03 NOTE — Plan of Care (Signed)
  Problem: Education: Goal: Knowledge of General Education information will improve Description: Including pain rating scale, medication(s)/side effects and non-pharmacologic comfort measures Outcome: Progressing   Problem: Health Behavior/Discharge Planning: Goal: Ability to manage health-related needs will improve Outcome: Progressing   Problem: Clinical Measurements: Goal: Ability to maintain clinical measurements within normal limits will improve Outcome: Progressing Goal: Respiratory complications will improve Outcome: Progressing   Problem: Elimination: Goal: Will not experience complications related to bowel motility Outcome: Progressing Goal: Will not experience complications related to urinary retention Outcome: Progressing   Problem: Pain Managment: Goal: General experience of comfort will improve Outcome: Progressing   Problem: Safety: Goal: Ability to remain free from injury will improve Outcome: Progressing   Problem: Skin Integrity: Goal: Risk for impaired skin integrity will decrease Outcome: Progressing   

## 2019-03-04 LAB — GLUCOSE, CAPILLARY
Glucose-Capillary: 114 mg/dL — ABNORMAL HIGH (ref 70–99)
Glucose-Capillary: 122 mg/dL — ABNORMAL HIGH (ref 70–99)
Glucose-Capillary: 129 mg/dL — ABNORMAL HIGH (ref 70–99)
Glucose-Capillary: 146 mg/dL — ABNORMAL HIGH (ref 70–99)
Glucose-Capillary: 147 mg/dL — ABNORMAL HIGH (ref 70–99)
Glucose-Capillary: 159 mg/dL — ABNORMAL HIGH (ref 70–99)

## 2019-03-04 MED ORDER — TRACE MINERALS CU-MN-SE-ZN 300-55-60-3000 MCG/ML IV SOLN
INTRAVENOUS | Status: DC
  Administered 2019-03-04: 18:00:00 via INTRAVENOUS
  Filled 2019-03-04: qty 870.4

## 2019-03-04 MED ORDER — HALOPERIDOL LACTATE 5 MG/ML IJ SOLN
2.5000 mg | Freq: Once | INTRAMUSCULAR | Status: AC
  Administered 2019-03-04: 2.5 mg via INTRAVENOUS
  Filled 2019-03-04: qty 1

## 2019-03-04 MED ORDER — HYDROMORPHONE HCL 1 MG/ML IJ SOLN
1.0000 mg | INTRAMUSCULAR | Status: DC | PRN
  Administered 2019-03-04 – 2019-03-18 (×43): 1 mg via INTRAVENOUS
  Filled 2019-03-04 (×43): qty 1

## 2019-03-04 NOTE — Plan of Care (Signed)

## 2019-03-04 NOTE — Progress Notes (Signed)
PHARMACY - ADULT TOTAL PARENTERAL NUTRITION CONSULT NOTE  Indication: Stomach perforation on bowel rest  Patient Measurements: Height: 6\' 6"  (198.1 cm) Weight: 253 lb 15.5 oz (115.2 kg) IBW/kg (Calculated) : 91.4 TPN AdjBW (KG): 107.4 Body mass index is 29.35 kg/m. Usual Weight: ~100kg  Assessment:  52 YOM presenting 11/14 with abdominal pain/distention and rectal bleeding, found with bowel perforation tx with medical management and bowel rest.  Paracentesis 11/13. Started liquids on 11/18-19 now back on bowel rest and pharmacy consulted to start TPN.  Little PO intake since admission and prealbumin <5.    GI: worsened CT 11/21 pneumoperitoneum - gas between liver/duodenum. Perforation of distal stomach, GIB. Poor surgical candidate. Umbilical hernia note-stable.   Prealbumin up to 5.2, BM x3.  PPI IV BID C/o abd discomfort 12/3.  Endo: A1c 3.9% - CBGs 122-188.  Hydrocortisone IV for adrenal insufficiency (prednisone PTA).  Insulin requirements in the past 24 hours: 0 Lytes: Na down to 129, Cl improved at 94, Corrected Ca up 12.5 (no calcium in TPN), Phos up to 3.4 Renal: CKD3 - SCr up 2.03 (BL 1.7-2), BUN up 74 - UOP improving (not all volume charted), off IVF Pulm: stable on RA Cards: VSS, QTc 472ms - midodrine TID (refusing doses) Hepatobil: hx cirrhosis Child Pugh C.  Ascites s/p paracentesis 11/29, 14L removed. LFTs / tbili / TG within normal limits (ILE 20% of kCal to provide more CHO, adjusted on 11/30 to get D5W off) Neuro: schizoaffective disorder, chronic pain - benztropine, Haldol, Zyprexa, PRN Fentanyl/Haldol ID: Zosyn for peritonitis (11/13 >> ) - afebrile, WBC 16.6. TPN Access: PICC 02/21/19 TPN start date: 02/18/19  Nutritional Goals (per RD rec on 12/3): 0981-1914 kCal, 125-145gm protein per day  Current Nutrition:  TPN Advanced to full liquid diet 12/2 - consuming up to 85-100% of meals  Plan:  Continue concentrated TPN at 80 ml/hr, providing 131g AA, 442g CHO  and 51g ILE for a total of 2534 kCal, meeting 100% of patients needs Electrolytes in TPN: increased Na again on 12/4, continue current Phos (last increased 12/2), continue current K and Mg, no Ca, max Chloride. Daily multivitamin and trace elements in TPN Sensitive SSI discontinued by MD on 12/3 - monitor CBGs.  F/U TPN labs, PO intake/diet advancement, watch plts  Sloan Leiter, PharmD, BCPS, BCCCP Clinical Pharmacist Please refer to [facility] for [place] numbers 03/04/2019, 8:40 AM

## 2019-03-04 NOTE — Progress Notes (Addendum)
PROGRESS NOTE  [name] OZH:[id number] DOB: 1960/04/04 DOA: 02/08/2019 PCP: Fleet Contras, MD  HPI/Recap of past 84 hours: 58 year old male with cirrhosis hepatomegaly splenomegaly hepatitis C esophageal varices adrenal insufficiency schizophrenia hypothyroidism PTSD chronic kidney disease stage III they who was brought to the hospital for abdominal pain with distention and rectal bleeding.  He was found to have perforation at the bowel near pyloric or stomach he underwent upper GI on November 16 showing focal contained perforated the in the distal stomach.  Surgery was consulted.  He was but he was declared a nonoperative candidate and recommended to continue continue nonoperative management with antibiotics and bowel rest.  Patient is currently on Zosyn his white count is still elevated.  And CT scan of the abdomen and pelvis did not show any significant change.  Palliative care team is following psychiatry also has evaluated patient and stated that he does not have capacity to make his own decision  Subjective: Patient seen and examined at bedside he was sleepy there is a sitter at bedside when I try to examine him he told me to put his covers back up.  The seated stated that he has been a little irritable and demanding that he has 3 Coke bottles on his table and the Coke bottle should be aligned because he woke up and found that it was out of place and it really upset him for things to be out of place.  March 04, 2019. Subjective: Patient seen and examined at bedside he is lying on bed eyes were closed 48 open them and engaged in conversation stated he was doing well complain of abdominal pain around where his PEG tube is.  He still has his PEG tube and nurse noted that it had a foul order this morning the area was cleaned and dressing changed   Assessment/Plan: Principal Problem:   Bowel perforation (HCC) Active Problems:   Schizoaffective disorder (HCC)   PTSD (post-traumatic stress  disorder)   History of adrenal insufficiency   GI bleed   Cirrhosis (HCC)   Esophageal varices in cirrhosis (HCC)   CKD (chronic kidney disease) stage 3, GFR 30-59 ml/min   Abdominal pain   Bleeding per rectum   Septic shock (HCC)   Pneumoperitoneum   Central venous catheter in place   Goals of care, counseling/discussion   Palliative care by specialist   AKI (acute kidney injury) (HCC)   Palliative care encounter   Acute renal failure with acute tubular necrosis superimposed on stage 3b chronic kidney disease (HCC)   Pressure injury of skin   Advanced care planning/counseling discussion   Abdominal distention   #1 severe septic shock secondary to perforated bowel/pneumoperitoneum and gastric perforation patient is not a good surgical candidate surgery service recommended conservative management with IV antibiotics he will continue his Zosyn.  He is currently on full liquids and TPN patient has been seen by palliative care service.  2.  Leukocytosis secondary to #1 continue to monitor recheck CBC  3.  Resolving acute metabolic encephalopathy likely multifactorial secondary to sepsis.  Patient also likely has underlying psychiatric illness and he is responding well to Zyprexa and Haldol he still gets agitated and upset when things are not arranged where he wants it.  4.  Hypovolemic hyponatremia sodium is still low continue IV fluid restriction and monitor  5.  Decompensated hepatitis C liver cirrhosis with ascites  6.  Thrombocytopenia likely in the setting of decompensated hepatitis C liver cirrhosis we will continue to  monitor platelet and supportive care.  7.  Stage III chronic kidney disease.  His creatinine is at baseline we will continue to avoid nephrotoxins medication.  His renal ultrasound was suggestive of chronic medical renal disease  8.  History of PTSD/schizoaffective disorder.  He has been evaluated by psychiatry yesterday he was quite irritable and did not want to  do anything but today he is calmer more cooperative   9.  Physical debility ambulatory dysfunction patient is being recommended for SNF by physical therapy Case worker is assisting with placement    Code Status: DNR  Severity of Illness: The appropriate patient status for this patient is INPATIENT. Inpatient status is judged to be reasonable and necessary in order to provide the required intensity of service to ensure the patient's safety. The patient's presenting symptoms, physical exam findings, and initial radiographic and laboratory data in the context of their chronic comorbidities is felt to place them at high risk for further clinical deterioration. Furthermore, it is not anticipated that the patient will be medically stable for discharge from the hospital within 2 midnights of admission. The following factors support the patient status of inpatient.    * I certify that at the point of admission it is my clinical judgment that the patient will require inpatient hospital care spanning beyond 2 midnights from the point of admission due to high intensity of service, high risk for further deterioration and high frequency of surveillance required.*Patient is still on IV antibiotics his white count is still elevated, his sodium is still low but improved    Family Communication: None at bedside  Disposition Plan: PT recommended SNF.  Social worker is assisting with when stable but he is not ready for discharge at this time due to ongoing sepsis treatment and psychosocial factors   Consultants:  Psychiatry  Palliative care  Procedures:  EGD  Antimicrobials:  IV Zosyn  DVT prophylaxis: SCD   Objective: Vitals:   03/03/19 0411 03/03/19 1319 03/03/19 2024 03/04/19 0456  BP: 119/73 120/71 118/76 108/71  Pulse: 97 (!) 103 (!) 101 (!) 110  Resp: 16 16 18 18   Temp: 98.3 F (36.8 C) 98.8 F (37.1 C) 98.2 F (36.8 C) 98.6 F (37 C)  TempSrc: Oral Oral Oral Oral  SpO2: 96%  97% 99% 95%  Weight:      Height:        Intake/Output Summary (Last 24 hours) at 03/04/2019 0856 Last data filed at 03/04/2019 0556 Gross per 24 hour  Intake 1620.53 ml  Output -  Net 1620.53 ml   Filed Weights   02/27/19 0537 03/01/19 0527 03/02/19 0453  Weight: 110.5 kg 113.5 kg 115.2 kg   Body mass index is 29.35 kg/m.  Exam:  . General: 58 y.o. year-old male well developed well nourished in no acute distress.  Alert and oriented sleeping woke up . HEENT neck is supple . Chest is clear bilaterally, he does have a prominent anterior chest deformity . Abdomen soft PEG tube is in place, mild tender to palpation . , Extremity has 2+ edema . Psychiatry: Mood is appropriate . Skin: Left heel ulcer    Data Reviewed: CBC: Recent Labs  Lab 02/26/19 0400 02/27/19 0347 02/28/19 0359 03/02/19 0506  WBC 12.6* 16.0* 13.1* 16.6*  NEUTROABS 10.8*  --  11.8*  --   HGB 8.9* 9.5* 8.6* 9.7*  HCT 28.8* 30.1* 26.7* 31.2*  MCV 97.6 96.2 95.0 97.2  PLT 99* 98* 69* 83*   Basic Metabolic Panel:  Recent Labs  Lab 02/26/19 0400 02/27/19 0347 02/28/19 0359 03/01/19 0341 03/02/19 0506  NA 137 131* 129* 131* 129*  K 4.3 4.2 4.7 4.7 4.4  CL 100 97* 92* 94* 94*  CO2 29 28 30 29 28   GLUCOSE 139* 127* 149* 133* 145*  BUN 69* 68* 66* 68* 74*  CREATININE 2.19* 2.03* 2.06* 1.99* 2.03*  CALCIUM 10.3 10.0 9.9 10.3 10.5*  MG 1.9  --   --  1.8  --   PHOS 2.9  --  2.2* 3.3 3.4   GFR: Estimated Creatinine Clearance: 56.6 mL/min (A) (by C-G formula based on SCr of 2.03 mg/dL (H)). Liver Function Tests: Recent Labs  Lab 02/26/19 0400 02/27/19 0347 03/01/19 0341  AST 19 32 27  ALT 11 19 19   ALKPHOS 61 63 77  BILITOT 1.6* 1.5* 1.0  PROT 5.3* 5.7* 5.7*  ALBUMIN 1.6* 1.6* 1.5*   No results for input(s): LIPASE, AMYLASE in the last 168 hours. No results for input(s): AMMONIA in the last 168 hours. Coagulation Profile: No results for input(s): INR, PROTIME in the last 168 hours.  Cardiac Enzymes: No results for input(s): CKTOTAL, CKMB, CKMBINDEX, TROPONINI in the last 168 hours. BNP (last 3 results) No results for input(s): PROBNP in the last 8760 hours. HbA1C: No results for input(s): HGBA1C in the last 72 hours. CBG: Recent Labs  Lab 03/03/19 1735 03/03/19 2026 03/03/19 2339 03/04/19 0428 03/04/19 0753  GLUCAP 174* 188* 147* 129* 122*   Lipid Profile: No results for input(s): CHOL, HDL, LDLCALC, TRIG, CHOLHDL, LDLDIRECT in the last 72 hours. Thyroid Function Tests: No results for input(s): TSH, T4TOTAL, FREET4, T3FREE, THYROIDAB in the last 72 hours. Anemia Panel: No results for input(s): VITAMINB12, FOLATE, FERRITIN, TIBC, IRON, RETICCTPCT in the last 72 hours. Urine analysis:    Component Value Date/Time   COLORURINE YELLOW 02/18/2019 1839   APPEARANCEUR HAZY (A) 02/18/2019 1839   LABSPEC 1.019 02/18/2019 1839   PHURINE 5.0 02/18/2019 1839   GLUCOSEU NEGATIVE 02/18/2019 1839   HGBUR NEGATIVE 02/18/2019 1839   BILIRUBINUR NEGATIVE 02/18/2019 1839   KETONESUR NEGATIVE 02/18/2019 1839   PROTEINUR NEGATIVE 02/18/2019 1839   UROBILINOGEN 1.0 01/19/2013 0015   NITRITE NEGATIVE 02/18/2019 1839   LEUKOCYTESUR NEGATIVE 02/18/2019 1839   Sepsis Labs: @LABRCNTIP (procalcitonin:4,lacticidven:4)  )No results found for this or any previous visit (from the past 240 hour(s)).    Studies: No results found.  Scheduled Meds: . benztropine mesylate  0.5 mg Intramuscular Daily  . Chlorhexidine Gluconate Cloth  6 each Topical Daily  . haloperidol  2 mg Oral Daily  . hydrocortisone sod succinate (SOLU-CORTEF) inj  25 mg Intravenous Q12H  . midodrine  10 mg Oral TID WC  . Muscle Rub   Topical BID  . OLANZapine zydis  5 mg Oral QHS  . pantoprazole (PROTONIX) IV  40 mg Intravenous Q12H  . sodium chloride flush  10-40 mL Intracatheter Q12H    Continuous Infusions: . sodium chloride    . sodium chloride Stopped (02/12/19 1737)  . sodium chloride Stopped  (02/19/19 1422)  . piperacillin-tazobactam (ZOSYN)  IV 3.375 g (03/04/19 0200)  . TPN ADULT (ION) 80 mL/hr at 03/03/19 1740     LOS: 24 days     Cristal Deer, MD [facility]  To reach me or the doctor on call, go to: [email or web address] Password [email or web address]  03/04/2019, 8:56 AM

## 2019-03-05 LAB — COMPREHENSIVE METABOLIC PANEL
ALT: 28 U/L (ref 0–44)
AST: 35 U/L (ref 15–41)
Albumin: 1.5 g/dL — ABNORMAL LOW (ref 3.5–5.0)
Alkaline Phosphatase: 110 U/L (ref 38–126)
Anion gap: 8 (ref 5–15)
BUN: 90 mg/dL — ABNORMAL HIGH (ref 6–20)
CO2: 25 mmol/L (ref 22–32)
Calcium: 10.5 mg/dL — ABNORMAL HIGH (ref 8.9–10.3)
Chloride: 97 mmol/L — ABNORMAL LOW (ref 98–111)
Creatinine, Ser: 1.97 mg/dL — ABNORMAL HIGH (ref 0.61–1.24)
GFR calc Af Amer: 42 mL/min — ABNORMAL LOW (ref >60)
GFR calc non Af Amer: 36 mL/min — ABNORMAL LOW (ref >60)
Glucose, Bld: 128 mg/dL — ABNORMAL HIGH (ref 70–99)
Potassium: 4.2 mmol/L (ref 3.5–5.1)
Sodium: 130 mmol/L — ABNORMAL LOW (ref 135–145)
Total Bilirubin: 1.1 mg/dL (ref 0.3–1.2)
Total Protein: 5.8 g/dL — ABNORMAL LOW (ref 6.5–8.1)

## 2019-03-05 LAB — CBC WITH DIFFERENTIAL/PLATELET
Abs Immature Granulocytes: 0.11 10*3/uL — ABNORMAL HIGH (ref 0.00–0.07)
Basophils Absolute: 0 10*3/uL (ref 0.0–0.1)
Basophils Relative: 0 %
Eosinophils Absolute: 0.5 10*3/uL (ref 0.0–0.5)
Eosinophils Relative: 3 %
HCT: 30.7 % — ABNORMAL LOW (ref 39.0–52.0)
Hemoglobin: 9.7 g/dL — ABNORMAL LOW (ref 13.0–17.0)
Immature Granulocytes: 1 %
Lymphocytes Relative: 7 %
Lymphs Abs: 1.3 10*3/uL (ref 0.7–4.0)
MCH: 30.9 pg (ref 26.0–34.0)
MCHC: 31.6 g/dL (ref 30.0–36.0)
MCV: 97.8 fL (ref 80.0–100.0)
Monocytes Absolute: 1.5 10*3/uL — ABNORMAL HIGH (ref 0.1–1.0)
Monocytes Relative: 8 %
Neutro Abs: 14.6 10*3/uL — ABNORMAL HIGH (ref 1.7–7.7)
Neutrophils Relative %: 81 %
Platelets: 88 10*3/uL — ABNORMAL LOW (ref 150–400)
RBC: 3.14 MIL/uL — ABNORMAL LOW (ref 4.22–5.81)
RDW: 19.5 % — ABNORMAL HIGH (ref 11.5–15.5)
WBC: 18 10*3/uL — ABNORMAL HIGH (ref 4.0–10.5)
nRBC: 0 % (ref 0.0–0.2)

## 2019-03-05 LAB — PHOSPHORUS: Phosphorus: 4.4 mg/dL (ref 2.5–4.6)

## 2019-03-05 LAB — GLUCOSE, CAPILLARY
Glucose-Capillary: 125 mg/dL — ABNORMAL HIGH (ref 70–99)
Glucose-Capillary: 136 mg/dL — ABNORMAL HIGH (ref 70–99)
Glucose-Capillary: 142 mg/dL — ABNORMAL HIGH (ref 70–99)
Glucose-Capillary: 143 mg/dL — ABNORMAL HIGH (ref 70–99)
Glucose-Capillary: 155 mg/dL — ABNORMAL HIGH (ref 70–99)
Glucose-Capillary: 158 mg/dL — ABNORMAL HIGH (ref 70–99)

## 2019-03-05 LAB — MAGNESIUM: Magnesium: 2 mg/dL (ref 1.7–2.4)

## 2019-03-05 LAB — PREALBUMIN: Prealbumin: 6.3 mg/dL — ABNORMAL LOW (ref 18–38)

## 2019-03-05 LAB — TRIGLYCERIDES: Triglycerides: 28 mg/dL (ref <150)

## 2019-03-05 MED ORDER — TRACE MINERALS CU-MN-SE-ZN 300-55-60-3000 MCG/ML IV SOLN
INTRAVENOUS | Status: DC
  Administered 2019-03-05: 18:00:00 via INTRAVENOUS
  Filled 2019-03-05: qty 965.6

## 2019-03-05 NOTE — Progress Notes (Signed)
Occupational Therapy Treatment Patient Details Name: [name] MRN: [id number] DOB: 04-08-1960 Today's Date: 03/05/2019    History of present illness Pt is a 58 y/o male admitted secondary to abdominal pain, distention and rectal bleeding. Pt found to have a bowel perforation. Per surgical team, no surgery indicated as pt is a "poor surgical candidate". Paracentesis completed on 11/18 with 5L removed. PMH including but not limited to cirrhosis, hep C, schizoaffective disorder, PTSD and CKD.   OT comments  Patient semi-supine in bed upon arrival. Initially patient declining therapy, however with encouragement agrees to sit up at edge of bed. Multimodal cues utilized to engage patient to assist with bed mobility however max A x2 to mobilize LEs and for trunk support. After approximately 1-2 minutes patient repeatedly stating to lay him back down. Provide max encouragement to remain seated however patient becomes more vocal/agitated. Patient has difficulty carrying over directions to initiate laying back into bed requires mod A x2 for bed mobility and repositioning. Max encouragement and education for participation in bed level exercises as patient is very edematous. Patient wanting therapists to perform all the movements, only when patient's friend arrives does patient a few ankle pumps.    Follow Up Recommendations  SNF;Supervision/Assistance - 24 hour    Equipment Recommendations  Other (comment)(defer to next venue)       Precautions / Restrictions Precautions Precautions: Fall       Mobility Bed Mobility Overal bed mobility: Needs Assistance Bed Mobility: Supine to Sit;Sit to Supine     Supine to sit: +2 for physical assistance;Mod assist;Max assist Sit to supine: +2 for physical assistance;Mod assist   General bed mobility comments: Pt's response to any request to move is "you do it." Pt encouraged to participate but only giving minimal effort. Assist needed for BLE and to  raise/lower trunk.  Transfers Overall transfer level: Needs assistance               General transfer comment: unable today due to pt refusal    Balance Overall balance assessment: Needs assistance Sitting-balance support: Feet supported;Single extremity supported Sitting balance-Leahy Scale: Fair Sitting balance - Comments: Pt only sat EOB x 1-2 minutes before insisting he return to bed. Encouragement given to sit longer but pt became agitated and insistant on returning to bed. Postural control: Posterior lean     Standing balance comment: unable to attempt standing                           ADL either performed or assessed with clinical judgement   ADL Overall ADL's : Needs assistance/impaired Eating/Feeding: Bed level;Set up Eating/Feeding Details (indicate cue type and reason): patient repeatedly asking to have drink opened, encourage patient to try on his own and set up for dominant hand to open his drink. patient able with persistance from OT                                                   Cognition Arousal/Alertness: Awake/alert Behavior During Therapy: Flat affect;Agitated Overall Cognitive Status: No family/caregiver present to determine baseline cognitive functioning Area of Impairment: Following commands;Problem solving;Safety/judgement                       Following Commands: Follows one step commands inconsistently Safety/Judgement: Decreased  awareness of deficits;Decreased awareness of safety   Problem Solving: Decreased initiation;Difficulty sequencing;Requires verbal cues General Comments: Pt can be very manipulative. Easily agitated. Wants to be in control and do things his way.              General Comments patient very edematous, especially in LEs. multiple attempts made for UB/LB exercise. only when patient's friend arrive does patient attempt ankle pumps. friend does inform therapists a year ago today  patient's mother passed away and patient is upset today.    Pertinent Vitals/ Pain       Pain Assessment: Faces Faces Pain Scale: Hurts a little bit Pain Location: generalized with mobility Pain Descriptors / Indicators: Grimacing Pain Intervention(s): Limited activity within patient's tolerance;Repositioned      Frequency  Min 2X/week        Progress Toward Goals  OT Goals(current goals can now be found in the care plan section)  Progress towards OT goals: Not progressing toward goals - comment(limited engagement, self limiting)  Acute Rehab OT Goals Patient Stated Goal: not stated OT Goal Formulation: With patient Time For Goal Achievement: 03/19/19 Potential to Achieve Goals: Fair ADL Goals Pt Will Perform Grooming: bed level;with min assist Pt Will Perform Upper Body Bathing: bed level;with mod assist Pt Will Perform Lower Body Bathing: with mod assist;bed level Additional ADL Goal #1: Pt will complete bed mobility at mod A level to prepare for EOB ADLs.  Plan Discharge plan remains appropriate;Frequency remains appropriate    Co-evaluation    PT/OT/SLP Co-Evaluation/Treatment: Yes Reason for Co-Treatment: Necessary to address cognition/behavior during functional activity;For patient/therapist safety;To address functional/ADL transfers PT goals addressed during session: Mobility/safety with mobility;Balance;Strengthening/ROM OT goals addressed during session: ADL's and self-care      AM-PAC OT "6 Clicks" Daily Activity     Outcome Measure   Help from another person eating meals?: A Little Help from another person taking care of personal grooming?: A Lot Help from another person toileting, which includes using toliet, bedpan, or urinal?: Total Help from another person bathing (including washing, rinsing, drying)?: Total Help from another person to put on and taking off regular upper body clothing?: Total Help from another person to put on and taking off regular  lower body clothing?: Total 6 Click Score: 9    End of Session    OT Visit Diagnosis: Muscle weakness (generalized) (M62.81);Pain;Other symptoms and signs involving cognitive function;Unsteadiness on feet (R26.81) Pain - part of body: (generalized)   Activity Tolerance Treatment limited secondary to agitation   Patient Left in bed;with call bell/phone within reach;with family/visitor present   Nurse Communication Mobility status        Time: [id number] OT Time Calculation (min): 23 min  Charges: OT General Charges $OT Visit: 1 Visit OT Treatments $Self Care/Home Management : 8-22 mins  Shon Millet OT OT office: [place] 03/05/2019, 2:46 PM

## 2019-03-05 NOTE — TOC Progression Note (Signed)
Transition of Care [facility]) - Progression Note    Patient Details  Name: [name] MRN: [id number] Date of Birth: 07-27-1960  Transition of Care [facility]) CM/SW [place], [place] Phone Number: 03/05/2019, 4:47 PM  Clinical Narrative:    CSW continues to follow, pt remains on TPN which is a barrier to SNF placement at this time.    Expected Discharge Plan: [place] Barriers to Discharge: Continued Medical Work up, Requiring sitter/restraints, Other (comment)(pt on TPN, sitter)  Expected Discharge Plan and Services Expected Discharge Plan: [place] In-house Referral: Clinical Social Work Discharge Planning Services: CM Consult   Living arrangements for the past 2 months: Apartment  Readmission Risk Interventions Readmission Risk Prevention Plan 01/11/2019  Transportation Screening Complete  Medication Review [employer] photographer) Complete  PCP or Specialist appointment within 3-5 days of discharge Complete  HRI or [place] Complete  SW Recovery Care/Counseling Consult Complete  [place] Not Applicable  Some recent data might be hidden

## 2019-03-05 NOTE — Progress Notes (Signed)
PHARMACY - ADULT TOTAL PARENTERAL NUTRITION CONSULT NOTE  Indication: Stomach perforation on bowel rest  Patient Measurements: Height: 6\' 6"  (198.1 cm) Weight: 279 lb 12.2 oz (126.9 kg) IBW/kg (Calculated) : 91.4 TPN AdjBW (KG): 107.4 Body mass index is 32.33 kg/m. Usual Weight: ~100kg  Assessment:  [name] presenting 11/14 with abdominal pain/distention and rectal bleeding, found with bowel perforation tx with medical management and bowel rest.  Paracentesis 11/13. Started liquids on 11/18-19 now back on bowel rest and pharmacy consulted to start TPN.  Little PO intake since admission and prealbumin <5.    GI: worsened CT 11/21 pneumoperitoneum - gas between liver/duodenum. Perforation of distal stomach, GIB. Poor surgical candidate. Umbilical hernia note-stable.   Prealbumin up to 6.3, BM x4.  PPI IV BID Endo: A1c 3.9% - CBGs controlled, SSI d/c'ed 12/3.  Hydrocortisone IV for adrenal insufficiency (prednisone PTA).  Lytes: Na/CL 130/97, CoCa 12.5 (no calcium in TPN), Phos up to 4.4 Renal: CKD3 - SCr down 1.97 (BL 1.7-2), BUN up 90 - UOP unknown (not all volume charted) Pulm: stable on RA Cards: BP stable, mild tachy, QTc 464ms - midodrine TID Hepatobil: hx cirrhosis Child Pugh C.  Ascites s/p paracentesis 11/29, 14L removed. LFTs / tbili / TG WNL (ILE 20% of kCal to provide more CHO, adjusted on 11/30 to get D5W off, increase to 25% on 12/7 since adjusting TPN to provide more AA) Neuro: schizoaffective disorder, chronic pain - benztropine, Haldol, Zyprexa, PRN Fentanyl/Haldol ID: Zosyn for peritonitis (11/13 >> ) - afebrile, WBC up to 18 TPN Access: PICC 02/21/19 TPN start date: 02/18/19  Nutritional Goals (per RD rec on 12/3): 2450-2650 kCal, 125-145gm protein per day  Current Nutrition:  TPN Advanced to full liquid diet 12/2 - consuming up to 85-100% of meals  Plan:  Adjust TPN to provide more protein/calories Concentrated TPN at 85 ml/hr will provide 145g AA, 408g CHO and  65g ILE for a total of 2621 kCal, meeting 100% of patients needs Electrolytes in TPN: max Na, reduce Phos, no Ca, max chloride Daily multivitamin and trace elements in TPN F/U labs on Wed (BMET, Phos), PO intake/diet advancement vs cycling TPN, Zosyn LOT  [name], PharmD, BCPS, [place] 03/05/2019, 8:18 AM

## 2019-03-05 NOTE — Plan of Care (Signed)
  Problem: Education: Goal: Knowledge of General Education information will improve Description Including pain rating scale, medication(s)/side effects and non-pharmacologic comfort measures Outcome: Progressing   Problem: Health Behavior/Discharge Planning: Goal: Ability to manage health-related needs will improve Outcome: Progressing   Problem: Clinical Measurements: Goal: Ability to maintain clinical measurements within normal limits will improve Outcome: Progressing   Problem: Elimination: Goal: Will not experience complications related to bowel motility Outcome: Progressing Goal: Will not experience complications related to urinary retention Outcome: Progressing   Problem: Pain Managment: Goal: General experience of comfort will improve Outcome: Progressing   Problem: Safety: Goal: Ability to remain free from injury will improve Outcome: Progressing   Problem: Skin Integrity: Goal: Risk for impaired skin integrity will decrease Outcome: Progressing   

## 2019-03-05 NOTE — Progress Notes (Signed)
PROGRESS NOTE  [name] JOI:[id number] DOB: May 18, 1960 DOA: 02/08/2019 PCP: Fleet Contras, MD  HPI/Recap of past 47 hours:  58 year old with history of cirrhosis with splenomegaly, hepatitis C, esophageal varices, adrenal insufficiency, hypothyroidism, schizoaffective disorder, PTSD, CKD stage IIIa initially brought to the hospital for worsening abdominal pain, distention and rectal bleeding.  Work-up showed perforated bowel and pneumoperitoneum.  General surgery recommended nonoperative management with IV antibiotics and bowel rest.  Due to worsening of the pain, repeat CT of the abdomen pelvis showed free intra-abdominal fluid and pneumoperitoneum.  There is also perforated bowel near pyloric or stomach.  Underwent UGI on 11/16 showing focal contained perforation in distal stomach.  Palliative care team is following. Psychiatry evaluate the patient and stated does not have capacity to make his own decisions.  Subjective : No acute issues or events overnight, patient evaluated at bedside, continues to request discharge home, we discussed patient will need to tolerate p.o. and cooperate with PT prior to any disposition on discharge discussion.  Patient otherwise declines chest pain shortness of breath abdominal pain abdominal distention nausea vomiting diarrhea constipation headache fevers or chills.  Assessment/Plan: Principal Problem:   Bowel perforation (HCC) Active Problems:   Schizoaffective disorder (HCC)   PTSD (post-traumatic stress disorder)   History of adrenal insufficiency   GI bleed   Cirrhosis (HCC)   Esophageal varices in cirrhosis (HCC)   CKD (chronic kidney disease) stage 3, GFR 30-59 ml/min   Abdominal pain   Bleeding per rectum   Septic shock (HCC)   Pneumoperitoneum   Central venous catheter in place   Goals of care, counseling/discussion   Palliative care by specialist   AKI (acute kidney injury) (HCC)   Palliative care encounter   Acute renal failure with  acute tubular necrosis superimposed on stage 3b chronic kidney disease (HCC)   Pressure injury of skin   Advanced care planning/counseling discussion   Abdominal distention    Severe septic shock secondary to perforated bowel/pneumoperitoneum and gastric perforation -Seen by surgical services-recommend conservative management and IV antibiotics-Zosyn, given high risk of mortality in a poor surgical candidate. -Upper GI series that showed gastric perforation upon admission -Repeat abdominal x-ray 12/2-shows ascites. CT abd pelvis 03/02/19 showed large volume ascites with large volume pneumoperitoneum- predominantly in the anterior peritoneal cavity, not substantially changed since 02/17/2019 CT. -Per sideline discussion today with GI will advance diet as tolerated.  If patient can tolerate diet without worsening abdominal pain distention fever or leukocytosis would consider discharge in the next 72 to 96 hours. -Consider repeat imaging after tolerating more advance diet to ensure no worsening abdominal pneumoperitoneum although if patient remains clinically stable would suspect imaging to be stable as well. Leukocytosis/neutrophilia continues, trending upwards over the past few days despite Zosyn Patient remains at high risk for bloodstream infection given prolonged TPN -we hope to discontinue this in the next 24 hours pending p.o. tolerance Consider blood cultures for bacteremia and fungemia if leukocytosis continues to rise patient has worsening symptoms, appears septic, or has fever  Resolving Acute metabolic encephalopathy likely multifactorial secondary to sepsis versus uncontrolled underlying psychiatric illness. Reorient as needed Responding well to zyprexa and haldol  Decompensated hepatitis C liver cirrhosis with ascites Avoid hepatotoxic agents Last paracentesis was on 02/25/2019 by interventional radiology with 14 L of clear yellow fluid removed.  Hypervolemic hyponatremia Sodium  drop from 131 to 129 Start fluid restriction. Daily BMPs  Hypotension Blood pressure stable on midodrine, continue Maintain MAP greater than 65.  Continue  to monitor vital signs.  Thrombocytopenia likely in the setting of decompensated hepatitis C liver cirrhosis Platelet count stable 83K. Continue supportive care.   Paracentesis as needed.  Will need to follow-up appointment GI outpatient.  Acute blood loss anemia/gastric and duodenal ulcer -EGD in October 2020 showed grade D esophagitis with nonbleeding gastric ulcer, duodenal ulcer and hiatal hernia. -Continue PPI  Hyperglycemia, intermittently Last hemoglobin A1c 3.9 on 02/09/2019 Hold off insulin to avoid hypoglycemia  CKD stage IIIa -Baseline creatinine 1.8.   Creatinine stable around baseline  continue to avoid nephrotoxins Renal ultrasound suggestive of chronic medical renal disease.  There is consideration of hepatorenal syndrome, continue midodrine.    History of PTSD/schizoaffective disorder Seen by psychiatry patient deemed not to have capacity to make medical decision.  Physical debility/ambulatory dysfunction PT recommended SNF CSW assisting with placement Continue fall precautions  DVT prophylaxis: SCDs due to worsening thrombocytopenia. Code Status: DNR Family Communication: None at bedside  Disposition Plan:  Patient is currently not appropriate for discharge due to ongoing sepsis physiology, poor p.o. intake, ongoing need for IV medications, treatment and close monitoring.  Disposition will likely depend on patient's ability to tolerate p.o. safely, improvement in his blood work concerning for infection, as well as after safe disposition location has been secured.  Consultants:   Palliative care  Antimicrobials:   IV Zosyn   Objective: Vitals:   03/04/19 0456 03/04/19 1417 03/04/19 2043 03/05/19 0357  BP: 108/71 128/68 111/72 116/75  Pulse: (!) 110 (!) 109 97 (!) 102  Resp: 18 18 20 14    Temp: 98.6 F (37 C) 98.1 F (36.7 C) 98.4 F (36.9 C) 98.2 F (36.8 C)  TempSrc: Oral Oral Oral Oral  SpO2: 95% 98% 98% 97%  Weight:    126.9 kg  Height:        Intake/Output Summary (Last 24 hours) at 03/05/2019 0842 Last data filed at 03/05/2019 14/09/2018 Gross per 24 hour  Intake 2051.17 ml  Output -  Net 2051.17 ml   Filed Weights   03/01/19 0527 03/02/19 0453 03/05/19 0357  Weight: 113.5 kg 115.2 kg 126.9 kg    Exam:  . General: 58 y.o. year-old male well-developed well-nourished in no acute distress.  Alert and interactive.    . Abdomen: Distended.  Nontender. . Musculoskeletal: Dependent edema all throughout.   41 HEENT:  Normocephalic atraumatic.  Sclerae nonicteric, noninjected.  Extraocular movements intact bilaterally. . Neck:  Without mass or deformity.  Trachea is midline. . Lungs:  Clear to auscultate bilaterally without rhonchi, wheeze, or rales. . Heart:  Regular rate and rhythm.  Without murmurs, rubs, or gallops.   Data Reviewed: CBC: Recent Labs  Lab 02/27/19 0347 02/28/19 0359 03/02/19 0506 03/05/19 0404  WBC 16.0* 13.1* 16.6* 18.0*  NEUTROABS  --  11.8*  --  14.6*  HGB 9.5* 8.6* 9.7* 9.7*  HCT 30.1* 26.7* 31.2* 30.7*  MCV 96.2 95.0 97.2 97.8  PLT 98* 69* 83* 88*   Basic Metabolic Panel: Recent Labs  Lab 02/27/19 0347 02/28/19 0359 03/01/19 0341 03/02/19 0506 03/05/19 0404  NA 131* 129* 131* 129* 130*  K 4.2 4.7 4.7 4.4 4.2  CL 97* 92* 94* 94* 97*  CO2 28 30 29 28 25   GLUCOSE 127* 149* 133* 145* 128*  BUN 68* 66* 68* 74* 90*  CREATININE 2.03* 2.06* 1.99* 2.03* 1.97*  CALCIUM 10.0 9.9 10.3 10.5* 10.5*  MG  --   --  1.8  --  2.0  PHOS  --  2.2* 3.3 3.4 4.4   GFR: Estimated Creatinine Clearance: 61 mL/min (A) (by C-G formula based on SCr of 1.97 mg/dL (H)). Liver Function Tests: Recent Labs  Lab 02/27/19 0347 03/01/19 0341 03/05/19 0404  AST 32 27 35  ALT 19 19 28   ALKPHOS 63 77 110  BILITOT 1.5* 1.0 1.1  PROT 5.7* 5.7* 5.8*   ALBUMIN 1.6* 1.5* 1.5*   No results for input(s): LIPASE, AMYLASE in the last 168 hours. No results for input(s): AMMONIA in the last 168 hours. Coagulation Profile: No results for input(s): INR, PROTIME in the last 168 hours. Cardiac Enzymes: No results for input(s): CKTOTAL, CKMB, CKMBINDEX, TROPONINI in the last 168 hours. BNP (last 3 results) No results for input(s): PROBNP in the last 8760 hours. HbA1C: No results for input(s): HGBA1C in the last 72 hours. CBG: Recent Labs  Lab 03/04/19 1653 03/04/19 2020 03/04/19 2358 03/05/19 0353 03/05/19 0750  GLUCAP 147* 159* 146* 125* 158*   Lipid Profile: Recent Labs    03/05/19 0404  TRIG 28   Thyroid Function Tests: No results for input(s): TSH, T4TOTAL, FREET4, T3FREE, THYROIDAB in the last 72 hours. Anemia Panel: No results for input(s): VITAMINB12, FOLATE, FERRITIN, TIBC, IRON, RETICCTPCT in the last 72 hours. Urine analysis:    Component Value Date/Time   COLORURINE YELLOW 02/18/2019 1839   APPEARANCEUR HAZY (A) 02/18/2019 1839   LABSPEC 1.019 02/18/2019 1839   PHURINE 5.0 02/18/2019 1839   GLUCOSEU NEGATIVE 02/18/2019 1839   HGBUR NEGATIVE 02/18/2019 1839   BILIRUBINUR NEGATIVE 02/18/2019 1839   KETONESUR NEGATIVE 02/18/2019 1839   PROTEINUR NEGATIVE 02/18/2019 1839   UROBILINOGEN 1.0 01/19/2013 0015   NITRITE NEGATIVE 02/18/2019 1839   LEUKOCYTESUR NEGATIVE 02/18/2019 1839   Sepsis Labs: @LABRCNTIP (procalcitonin:4,lacticidven:4)  )No results found for this or any previous visit (from the past 240 hour(s)).    Studies: No results found.  Scheduled Meds: . benztropine mesylate  0.5 mg Intramuscular Daily  . Chlorhexidine Gluconate Cloth  6 each Topical Daily  . haloperidol  2 mg Oral Daily  . hydrocortisone sod succinate (SOLU-CORTEF) inj  25 mg Intravenous Q12H  . midodrine  10 mg Oral TID WC  . Muscle Rub   Topical BID  . OLANZapine zydis  5 mg Oral QHS  . pantoprazole (PROTONIX) IV  40 mg  Intravenous Q12H  . sodium chloride flush  10-40 mL Intracatheter Q12H    Continuous Infusions: . sodium chloride    . sodium chloride Stopped (02/12/19 1737)  . sodium chloride Stopped (02/19/19 1422)  . piperacillin-tazobactam (ZOSYN)  IV 3.375 g (03/05/19 0113)  . TPN ADULT (ION)       LOS: 25 days   Benjamin Ishikawa, DO [facility] Pager [phone]  If 7PM-7AM, please contact night-coverage [email or web address] Password [facility] 03/05/2019, 8:42 AM

## 2019-03-05 NOTE — Progress Notes (Signed)
Physical Therapy Treatment Patient Details Name: [name] MRN: [id number] DOB: 06/25/1960 Today's Date: 03/05/2019    History of Present Illness Pt is a 58 y/o male admitted secondary to abdominal pain, distention and rectal bleeding. Pt found to have a bowel perforation. Per surgical team, no surgery indicated as pt is a "poor surgical candidate". Paracentesis completed on 11/18 with 5L removed. PMH including but not limited to cirrhosis, hep C, schizoaffective disorder, PTSD and CKD.    PT Comments    Pt's behavior continues to limited therapy session. He required +2 mod/max assist bed mobility. He was only willing to sit EOB x 1-2 minutes. Upon return to bed, pt did assist using hand grips on the headboard for scooting up in bed. Pt's friend, [name], arrived during session. With his encouragement, pt willing to demonstrate AROM/ankle pumps BLE. It was also brought to light that today is the one-year anniversary of [name]'s mother's passing. He became tearful at one point while talking to his friend. [name] encouraged pt that he needs to work more with therapy so he can get better. His assistance and encouragement were appreciated and helpful.    Follow Up Recommendations  SNF;Supervision/Assistance - 24 hour     Equipment Recommendations  Hospital bed    Recommendations for Other Services       Precautions / Restrictions Precautions Precautions: Fall    Mobility  Bed Mobility Overal bed mobility: Needs Assistance Bed Mobility: Supine to Sit;Sit to Supine     Supine to sit: +2 for physical assistance;Mod assist;Max assist Sit to supine: +2 for physical assistance;Mod assist   General bed mobility comments: Pt's response to any request to move is "you do it." Pt encouraged to participate but only giving minimal effort. Assist needed for BLE and to raise/lower trunk.  Transfers                    Ambulation/Gait                 Stairs              Wheelchair Mobility    Modified Rankin (Stroke Patients Only)       Balance Overall balance assessment: Needs assistance Sitting-balance support: Feet supported;Single extremity supported Sitting balance-Leahy Scale: Fair Sitting balance - Comments: Pt only sat EOB x 1-2 minutes before insisting he return to bed. Encouragement given to sit longer but pt became agitated and insistant on returning to bed.                                    Cognition Arousal/Alertness: Awake/alert Behavior During Therapy: Flat affect;Agitated Overall Cognitive Status: Difficult to assess                                 General Comments: Pt can be very manipulative. Easily agitated. Wants to be in control and do things his way.      Exercises      General Comments        Pertinent Vitals/Pain Pain Assessment: Faces Faces Pain Scale: Hurts a little bit Pain Location: generalized with mobility Pain Descriptors / Indicators: Grimacing Pain Intervention(s): Limited activity within patient's tolerance;Repositioned    Home Living                      Prior  Function            PT Goals (current goals can now be found in the care plan section) Acute Rehab PT Goals Patient Stated Goal: not stated PT Goal Formulation: With patient Time For Goal Achievement: 03/15/19 Potential to Achieve Goals: Fair Progress towards PT goals: Not progressing toward goals - comment(decreased participation from pt)    Frequency    Min 2X/week      PT Plan Current plan remains appropriate    Co-evaluation PT/OT/SLP Co-Evaluation/Treatment: Yes Reason for Co-Treatment: Necessary to address cognition/behavior during functional activity;Complexity of the patient's impairments (multi-system involvement);For patient/therapist safety PT goals addressed during session: Mobility/safety with mobility;Balance;Strengthening/ROM        AM-PAC PT "6 Clicks" Mobility    Outcome Measure  Help needed turning from your back to your side while in a flat bed without using bedrails?: A Lot Help needed moving from lying on your back to sitting on the side of a flat bed without using bedrails?: A Lot Help needed moving to and from a bed to a chair (including a wheelchair)?: Total Help needed standing up from a chair using your arms (e.g., wheelchair or bedside chair)?: Total Help needed to walk in hospital room?: Total Help needed climbing 3-5 steps with a railing? : Total 6 Click Score: 8    End of Session   Activity Tolerance: Treatment limited secondary to agitation Patient left: in bed;with call bell/phone within reach;with family/visitor present Nurse Communication: Mobility status PT Visit Diagnosis: Other abnormalities of gait and mobility (R26.89)     Time: 9509-3267 PT Time Calculation (min) (ACUTE ONLY): 25 min  Charges:  $Therapeutic Activity: 8-22 mins                     Lorrin Goodell, PT  Office # [phone] Pager [phone]    Lorriane Shire 03/05/2019, 1:46 PM

## 2019-03-06 LAB — GLUCOSE, CAPILLARY
Glucose-Capillary: 106 mg/dL — ABNORMAL HIGH (ref 70–99)
Glucose-Capillary: 107 mg/dL — ABNORMAL HIGH (ref 70–99)
Glucose-Capillary: 195 mg/dL — ABNORMAL HIGH (ref 70–99)
Glucose-Capillary: 141 mg/dL — ABNORMAL HIGH (ref 70–99)
Glucose-Capillary: 129 mg/dL — ABNORMAL HIGH (ref 70–99)
Glucose-Capillary: 109 mg/dL — ABNORMAL HIGH (ref 70–99)
Glucose-Capillary: 105 mg/dL — ABNORMAL HIGH (ref 70–99)

## 2019-03-06 MED ORDER — PANTOPRAZOLE SODIUM 40 MG PO TBEC
40.0000 mg | DELAYED_RELEASE_TABLET | Freq: Two times a day (BID) | ORAL | Status: DC
  Administered 2019-03-06 – 2019-03-26 (×39): 40 mg via ORAL
  Filled 2019-03-06 (×40): qty 1

## 2019-03-06 NOTE — TOC Progression Note (Signed)
Transition of Care [facility]) - Progression Note    Patient Details  Name: [name] MRN: [id number] Date of Birth: 1961-02-05  Transition of Care [facility]) CM/SW [place], [place] Phone Number: 03/06/2019, 11:22 AM  Clinical Narrative:    TPN has been cancelled, Advanced to full liquid diet 12/2 - consuming 100% of meals.  MD advancing diet.  Pt referral re sent to SNFs per pt friends request. Pt improving will continue to follow and support disposition.    Expected Discharge Plan: [place] Barriers to Discharge: No SNF bed, Continued Medical Work up  Expected Discharge Plan and Services Expected Discharge Plan: [place] In-house Referral: Clinical Social Work Discharge Planning Services: CM Consult Living arrangements for the past 2 months: Apartment  Readmission Risk Interventions Readmission Risk Prevention Plan 01/11/2019  Transportation Screening Complete  Medication Review Press photographer) Complete  PCP or Specialist appointment within 3-5 days of discharge Complete  HRI or [place] Complete  SW Recovery Care/Counseling Consult Complete  [place] Not Applicable  Some recent data might be hidden

## 2019-03-06 NOTE — NC FL2 (Signed)
[place] MEDICAID FL2 LEVEL OF CARE SCREENING TOOL     IDENTIFICATION  Patient Name: [name] Birthdate: October 28, 1960 Sex: male Admission Date (Current Location): 02/08/2019  [place] and [place] Number:  Haynes Bast [id number] Facility and Address:  [facility], [address], [place], [place] [address]      Provider Number: [id number]  Attending Physician Name and Address:  Azucena Fallen, MD  Relative Name and Phone Number:       Current Level of Care: Hospital Recommended Level of Care: Skilled Nursing Facility Prior Approval Number:    Date Approved/Denied:   PASRR Number: [id number] C (no end date)  Discharge Plan: SNF    Current Diagnoses: Patient Active Problem List   Diagnosis Date Noted  . Pressure injury of skin 03/01/2019  . Advanced care planning/counseling discussion   . Abdominal distention   . Acute renal failure with acute tubular necrosis superimposed on stage 3b chronic kidney disease (HCC)   . Palliative care encounter   . Palliative care by specialist   . AKI (acute kidney injury) (HCC)   . Goals of care, counseling/discussion   . Septic shock (HCC) 02/09/2019  . Pneumoperitoneum   . Central venous catheter in place   . Abdominal pain 02/08/2019  . Bowel perforation (HCC) 02/08/2019  . Bleeding per rectum   . Acute gastric ulcer   . Duodenal ulcer   . Acute esophagitis   . Acute blood loss anemia   . Hematemesis 01/08/2019  . Prolonged QT interval 01/08/2019  . Esophageal varices in cirrhosis (HCC) 01/08/2019  . CKD (chronic kidney disease) stage 3, GFR 30-59 ml/min 01/08/2019  . Suicidal ideation 01/08/2019  . Closed displaced fracture of neck of left fifth metacarpal bone 01/08/2019  . GI bleed 08/07/2018  . Acute abdominal pain   . Cirrhosis (HCC)   . Generalized abdominal pain   . Hematemesis with nausea   . Evaluation by psychiatric service required   . Acute upper gastrointestinal bleeding  10/15/2017  . PTSD (post-traumatic stress disorder)   . Hypothyroidism   . History of adrenal insufficiency   . H/O diabetes insipidus   . Schizoaffective disorder (HCC) 11/08/2011  . Personality disorder (HCC)   . Normocytic anemia   . Ascites   . Liver disease   . Hepatitis C   . Kidney disease   . GERD (gastroesophageal reflux disease)     Orientation RESPIRATION BLADDER Height & Weight     Self  Normal Incontinent Weight: 285 lb 11.5 oz (129.6 kg) Height:  6\' 6"  (198.1 cm)  BEHAVIORAL SYMPTOMS/MOOD NEUROLOGICAL BOWEL NUTRITION STATUS      Continent Diet(see discharge summary)  AMBULATORY STATUS COMMUNICATION OF NEEDS Skin   Extensive Assist Verbally Skin abrasions, Other (Comment)(deep tissue injury right heel with foam; abrasion on right shoulder with foam; generalized ecchymosis)                       Personal Care Assistance Level of Assistance  Bathing, Feeding, Dressing Bathing Assistance: Maximum assistance Feeding assistance: Limited assistance Dressing Assistance: Maximum assistance     Functional Limitations Info  Sight, Hearing, Speech Sight Info: Adequate Hearing Info: Adequate Speech Info: Adequate    SPECIAL CARE FACTORS FREQUENCY  OT (By licensed OT), PT (By licensed PT)     PT Frequency: 5x week OT Frequency: 5x week            Contractures Contractures Info: Not present    Additional  Factors Info  Code Status, Allergies, Psychotropic Code Status Info: Full Code Allergies Info: No Known Allergies Psychotropic Info: haloperidol (HALDOL) tablet 2 mg daily PO; OLANZapine zydis (ZYPREXA) disintegrating tablet 5 mg daily at bedtime PO         Current Medications (03/06/2019):  This is the current hospital active medication list Current Facility-Administered Medications  Medication Dose Route Frequency Provider Last Rate Last Dose  . 0.9 %  sodium chloride infusion   Intra-arterial PRN Sherryll Burger, Pratik D, DO      . 0.9 %  sodium chloride  infusion  250 mL Intravenous Continuous Oretha Milch, MD   Stopped at 02/12/19 1737  . 0.9 %  sodium chloride infusion   Intravenous PRN Hughie Closs, MD   Stopped at 02/19/19 1422  . benztropine mesylate (COGENTIN) injection 0.5 mg  0.5 mg Intramuscular Daily Regalado, Belkys A, MD   0.5 mg at 03/06/19 1015  . Chlorhexidine Gluconate Cloth 2 % PADS 6 each  6 each Topical Daily Regalado, Belkys A, MD   6 each at 03/06/19 1015  . haloperidol (HALDOL) tablet 2 mg  2 mg Oral Daily Dellinger, Marianne L, PA-C   2 mg at 03/06/19 1015  . haloperidol lactate (HALDOL) injection 0.5 mg  0.5 mg Intravenous BID BM & HS PRN Migdalia Dk, MD   0.5 mg at 03/06/19 0027  . hydrocortisone sodium succinate (SOLU-CORTEF) 100 MG injection 25 mg  25 mg Intravenous Q12H Regalado, Belkys A, MD   25 mg at 03/06/19 0142  . HYDROmorphone (DILAUDID) injection 1 mg  1 mg Intravenous Q3H PRN Bodenheimer, Charles A, NP   1 mg at 03/06/19 0137  . lidocaine (PF) (XYLOCAINE) 1 % injection    PRN Brayton El, PA-C   10 mL at 02/14/19 1207  . lidocaine (XYLOCAINE) 1 % (with pres) injection   Infiltration PRN Irish Lack, MD   2 mL at 02/21/19 1727  . midodrine (PROAMATINE) tablet 10 mg  10 mg Oral TID WC Shah, Pratik D, DO   10 mg at 03/06/19 0748  . Muscle Rub CREA   Topical BID Regalado, Belkys A, MD      . OLANZapine zydis (ZYPREXA) disintegrating tablet 5 mg  5 mg Oral QHS Barbara Cower, NP   5 mg at 03/05/19 2122  . ondansetron (ZOFRAN) injection 4 mg  4 mg Intravenous Q6H PRN Pahwani, Rinka R, MD      . pantoprazole (PROTONIX) EC tablet 40 mg  40 mg Oral BID Dang, Thuy D, RPH   40 mg at 03/06/19 1016  . piperacillin-tazobactam (ZOSYN) IVPB 3.375 g  3.375 g Intravenous Q8H Vicente Serene, RPH 12.5 mL/hr at 03/06/19 1013 3.375 g at 03/06/19 1013  . sodium chloride flush (NS) 0.9 % injection 10-40 mL  10-40 mL Intracatheter PRN Pahwani, Rinka R, MD      . sodium chloride flush (NS) 0.9 % injection 10-40 mL  10-40 mL  Intracatheter Q12H Regalado, Belkys A, MD   10 mL at 03/02/19 1034  . sodium chloride flush (NS) 0.9 % injection 10-40 mL  10-40 mL Intracatheter PRN Regalado, Belkys A, MD      . sodium chloride flush (NS) 0.9 % injection 10-40 mL  10-40 mL Intracatheter PRN Pahwani, Rinka R, MD   10 mL at 03/02/19 0515     Discharge Medications: Please see discharge summary for a list of discharge medications.  Relevant Imaging Results:  Relevant Lab Results:   Additional Information  SS#246 [place] [employer], LCSWA

## 2019-03-06 NOTE — Progress Notes (Signed)
Daily Progress Note   Patient Name: [name]       Date: 03/06/2019 DOB: 1960-11-22  Age: 58 y.o. MRN#: [id number] Attending Physician: Little Ishikawa, MD Primary Care Physician: Nolene Ebbs, MD Admit Date: 02/08/2019  Reason for Consultation/Follow-up: Establishing goals of care  Subjective: Patient awake. Denies pain. He is asking when he will go to rehab. He is asking to be cleaned. He was unable to discuss his medical condition.  Noted his diet is advancing, TNA stopped. FL2 has been sent in search of bed.  Review of Systems  Unable to perform ROS: Mental acuity    Length of Stay: 26  Current Medications: Scheduled Meds:  . benztropine mesylate  0.5 mg Intramuscular Daily  . Chlorhexidine Gluconate Cloth  6 each Topical Daily  . haloperidol  2 mg Oral Daily  . hydrocortisone sod succinate (SOLU-CORTEF) inj  25 mg Intravenous Q12H  . midodrine  10 mg Oral TID WC  . Muscle Rub   Topical BID  . OLANZapine zydis  5 mg Oral QHS  . pantoprazole  40 mg Oral BID  . sodium chloride flush  10-40 mL Intracatheter Q12H    Continuous Infusions: . sodium chloride    . sodium chloride Stopped (02/12/19 1737)  . sodium chloride Stopped (02/19/19 1422)  . piperacillin-tazobactam (ZOSYN)  IV 3.375 g (03/06/19 1013)    PRN Meds: Place/Maintain arterial line **AND** sodium chloride, sodium chloride, haloperidol lactate, HYDROmorphone (DILAUDID) injection, lidocaine (PF), lidocaine, ondansetron (ZOFRAN) IV, sodium chloride flush, sodium chloride flush, sodium chloride flush  Physical Exam          Vital Signs: BP 100/68 (BP Location: Left Arm)   Pulse 88   Temp 98.3 F (36.8 C) (Oral)   Resp 20   Ht 6\' 6"  (1.981 m)   Wt 129.6 kg   SpO2 100%   BMI 33.02 kg/m  SpO2:  SpO2: 100 % O2 Device: O2 Device: Room Air O2 Flow Rate:    Intake/output summary:   Intake/Output Summary (Last 24 hours) at 03/06/2019 1606 Last data filed at 03/06/2019 0227 Gross per 24 hour  Intake 109.43 ml  Output -  Net 109.43 ml   LBM: Last BM Date: 03/04/19 Baseline Weight: Weight: 99.8 kg Most recent weight: Weight: 129.6 kg       Palliative  Assessment/Data: PPS: 30%    Flowsheet Rows     Most Recent Value  Intake Tab  Referral Department  Hospitalist  Unit at Time of Referral  ER  Date Notified  02/09/19  Palliative Care Type  New Palliative care  Reason for referral  Clarify Goals of Care  Date of Admission  02/08/19  Date first seen by Palliative Care  02/11/19  # of days Palliative referral response time  2 Day(s)  # of days IP prior to Palliative referral  1  Clinical Assessment  Psychosocial & Spiritual Assessment  Palliative Care Outcomes      Patient Active Problem List   Diagnosis Date Noted  . Pressure injury of skin 03/01/2019  . Advanced care planning/counseling discussion   . Abdominal distention   . Acute renal failure with acute tubular necrosis superimposed on stage 3b chronic kidney disease (HCC)   . Palliative care encounter   . Palliative care by specialist   . AKI (acute kidney injury) (HCC)   . Goals of care, counseling/discussion   . Septic shock (HCC) 02/09/2019  . Pneumoperitoneum   . Central venous catheter in place   . Abdominal pain 02/08/2019  . Bowel perforation (HCC) 02/08/2019  . Bleeding per rectum   . Acute gastric ulcer   . Duodenal ulcer   . Acute esophagitis   . Acute blood loss anemia   . Hematemesis 01/08/2019  . Prolonged QT interval 01/08/2019  . Esophageal varices in cirrhosis (HCC) 01/08/2019  . CKD (chronic kidney disease) stage 3, GFR 30-59 ml/min 01/08/2019  . Suicidal ideation 01/08/2019  . Closed displaced fracture of neck of left fifth metacarpal bone 01/08/2019  . GI bleed 08/07/2018  . Acute  abdominal pain   . Cirrhosis (HCC)   . Generalized abdominal pain   . Hematemesis with nausea   . Evaluation by psychiatric service required   . Acute upper gastrointestinal bleeding 10/15/2017  . PTSD (post-traumatic stress disorder)   . Hypothyroidism   . History of adrenal insufficiency   . H/O diabetes insipidus   . Schizoaffective disorder (HCC) 11/08/2011  . Personality disorder (HCC)   . Normocytic anemia   . Ascites   . Liver disease   . Hepatitis C   . Kidney disease   . 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 Surgery was consulted but patient was not felt to be a surgical candidate due to the high risk from comorbidities. He has been treated conservatively. Palliative care was consulted to help address goals.  Assessment/Recommendations/Plan   Patient diet advancing, status improving  GOC are set at full scope, full code with hopes for D/C to SNF  Recommend continued Palliative f/u at next venue  PMT inpatient will shadow chart and intervene if patient declines  Goals of Care and Additional Recommendations:  Limitations on Scope of Treatment: Full Scope Treatment  Code Status:  Full code  Prognosis:   Unable to determine  Discharge Planning:  Skilled Nursing Facility for rehab with Palliative care service follow-up   Thank you for allowing the Palliative Medicine Team to assist in the care of this patient.   Time  In: 1500 Time Out: 1535 Total Time 35 minutes Prolonged Time Billed no      Greater  than 50%  of this time was spent counseling and coordinating care related to the above assessment and plan.  Ocie Bob, AGNP-C Palliative Medicine   Please contact Palliative Medicine Team phone at [phone] for questions and concerns.

## 2019-03-06 NOTE — Progress Notes (Signed)
PHARMACY - ADULT TOTAL PARENTERAL NUTRITION CONSULT NOTE  Indication: Stomach perforation on bowel rest  Patient Measurements: Height: 6\' 6"  (198.1 cm) Weight: 285 lb 11.5 oz (129.6 kg) IBW/kg (Calculated) : 91.4 TPN AdjBW (KG): 107.4 Body mass index is 33.02 kg/m. Usual Weight: ~100kg  Assessment:  6 YOM presenting 11/14 with abdominal pain/distention and rectal bleeding, found with bowel perforation tx with medical management and bowel rest.  Paracentesis 11/13. Started liquids on 11/18-19 now back on bowel rest and pharmacy consulted to start TPN.  Little PO intake since admission and prealbumin <5.    GI: worsened CT 11/21 pneumoperitoneum - gas between liver/duodenum. Perforation of distal stomach, GIB. Poor surgical candidate. Umbilical hernia note-stable.   Prealbumin up to 6.3, BM x4.  PPI IV BID Endo: A1c 3.9% - CBGs controlled, SSI d/c'ed 12/3.  Hydrocortisone IV for adrenal insufficiency (prednisone PTA).  Lytes: Na/CL 130/97, CoCa 12.5 (no calcium in TPN), Phos up to 4.4 Renal: CKD3 - SCr down 1.97 (BL 1.7-2), BUN up 90 - UOP unknown (not all volume charted) Hepatobil: hx cirrhosis Child Pugh C.  Ascites s/p paracentesis 11/29, 14L removed. LFTs / tbili / TG WNL (ILE 20% of kCal to provide more CHO, adjusted on 11/30 to get D5W off, increase to 25% on 12/7 since adjusting TPN to provide more AA)  TPN Access: PICC 02/21/19 TPN start date: 02/18/19  Nutritional Goals (per RD rec on 12/3): 2450-2650 kCal, 125-145gm protein per day  Current Nutrition:  TPN Advanced to full liquid diet 12/2 - consuming 100% of meals.  MD advancing diet.  Plan:  Stop TPN per MD Reduce TPN to 45 ml/hr, then stop at 1800 Change Protonix to PO D/C TPN labs and nursing care orders Recommend stopping Zosyn  Alan Drummer D. Mina Marble, PharmD, BCPS, [place] 03/06/2019, 8:38 AM

## 2019-03-06 NOTE — Progress Notes (Signed)
PROGRESS NOTE  [name] WUJ:[id number] DOB: December 06, 1960 DOA: 02/08/2019 PCP: Fleet Contras, MD  HPI/Recap of past 70 hours:  58 year old with history of cirrhosis with splenomegaly, hepatitis C, esophageal varices, adrenal insufficiency, hypothyroidism, schizoaffective disorder, PTSD, CKD stage IIIa initially brought to the hospital for worsening abdominal pain, distention and rectal bleeding.  Work-up showed perforated bowel and pneumoperitoneum.  General surgery recommended nonoperative management with IV antibiotics and bowel rest.  Due to worsening of the pain, repeat CT of the abdomen pelvis showed free intra-abdominal fluid and pneumoperitoneum.  There is also perforated bowel near pyloric or stomach.  Underwent UGI on 11/16 showing focal contained perforation in distal stomach.  Palliative care team is following. Psychiatry evaluate the patient and stated does not have capacity to make his own decisions.  Subjective : No acute issues or events overnight, patient evaluated at bedside, continues to request discharge home, we discussed patient will need to tolerate p.o. and cooperate with PT prior to any disposition on discharge discussion.  Patient otherwise declines chest pain shortness of breath abdominal pain abdominal distention nausea vomiting diarrhea constipation headache fevers or chills.  Assessment/Plan: Principal Problem:   Bowel perforation (HCC) Active Problems:   Schizoaffective disorder (HCC)   PTSD (post-traumatic stress disorder)   History of adrenal insufficiency   GI bleed   Cirrhosis (HCC)   Esophageal varices in cirrhosis (HCC)   CKD (chronic kidney disease) stage 3, GFR 30-59 ml/min   Abdominal pain   Bleeding per rectum   Septic shock (HCC)   Pneumoperitoneum   Central venous catheter in place   Goals of care, counseling/discussion   Palliative care by specialist   AKI (acute kidney injury) (HCC)   Palliative care encounter   Acute renal failure with  acute tubular necrosis superimposed on stage 3b chronic kidney disease (HCC)   Pressure injury of skin   Advanced care planning/counseling discussion   Abdominal distention    Severe septic shock secondary to perforated bowel/pneumoperitoneum and gastric perforation -Upper GI series that showed gastric perforation upon admission 02/08/19 - Per sideline discussion 03/05/2019 with GI will advance diet as tolerated.  If patient can tolerate diet without worsening abdominal pain distention fever or leukocytosis would consider discharge in the next 48-72 hours -patient now tolerating regular diet on 03/06/2019 without issue -Consider repeat imaging after tolerating more advance diet to ensure no worsening abdominal pneumoperitoneum although if patient remains clinically stable would suspect imaging to be stable as well. -Leukocytosis/neutrophilia continues to trend upwards -Patient remains on Zosyn, will sideline infectious disease to ensure appropriate coverage and duration of antibiotics for bowel perforation **UPDATE - Will DC Zosyn per discussion with ID - follow clinically** -Remains afebrile -Patient remains at high risk for bloodstream infection given prolonged TPN which has now been discontinued on 03/06/19 consider blood cultures for bacteremia and fungemia if leukocytosis continues to rise, if patient has worsening symptoms, appears septic, or has fever  Resolving Acute metabolic encephalopathy likely multifactorial secondary to sepsis versus uncontrolled underlying psychiatric illness. Reorient as needed Responding well to zyprexa and haldol  Decompensated hepatitis C liver cirrhosis with ascites Avoid hepatotoxic agents Last paracentesis was on 02/25/2019 by interventional radiology with 14 L of clear yellow fluid removed.  Hypervolemic hyponatremia Stable at 130 Start fluid restriction now that patient is taking PO again q48h labs  Hypotension Likely secondary to decompensated  cirrhosis  Blood pressure stable on midodrine, continue Maintain MAP greater than 65.  Continue to monitor vital signs.  Thrombocytopenia likely  in the setting of decompensated hepatitis C liver cirrhosis Platelet count stable 83K. Continue supportive care.   Paracentesis as needed.  Will need to follow-up appointment GI outpatient.  Acute blood loss anemia/gastric and duodenal ulcer -EGD in October 2020 showed grade D esophagitis with nonbleeding gastric ulcer, duodenal ulcer and hiatal hernia. -Continue PPI  Hyperglycemia, intermittently Last hemoglobin A1c 3.9 on 02/09/2019 Hold off insulin to avoid hypoglycemia  CKD stage IIIa -Baseline creatinine 1.8.   Creatinine stable around baseline  continue to avoid nephrotoxins Renal ultrasound suggestive of chronic medical renal disease.  There is consideration of hepatorenal syndrome, continue midodrine.    History of PTSD/schizoaffective disorder Seen by psychiatry patient deemed not to have capacity to make medical decision.  Physical debility/ambulatory dysfunction PT recommended SNF CSW assisting with placement Continue fall precautions  DVT prophylaxis: SCDs due to worsening thrombocytopenia. Code Status: DNR Family Communication: None at bedside  Disposition Plan:  Patient remains inpatient, now that TPN has been discontinued, tolerating p.o. more adequately, would likely be reasonable for discharge to SNF in the next 48 to 72 hours pending clinical course, tolerance of p.o., close monitoring to ensure there is no further ongoing infection or recurrent bowel perforation.  Patient continues to require IV antibiotics and close monitoring in the setting of ambulatory dysfunction.  Consultants:   Palliative care  Antimicrobials:   IV Zosyn   Objective: Vitals:   03/05/19 1413 03/05/19 2002 03/06/19 0327 03/06/19 0500  BP: 131/82 117/70 108/70   Pulse: (!) 102 (!) 103 100   Resp: 18 16 18    Temp:  98.1 F (36.7  C) 97.9 F (36.6 C)   TempSrc:  Oral Oral   SpO2: 97% 98% 96%   Weight:    129.6 kg  Height:        Intake/Output Summary (Last 24 hours) at 03/06/2019 0839 Last data filed at 03/06/2019 1884 Gross per 24 hour  Intake 1549.43 ml  Output -  Net 1549.43 ml   Filed Weights   03/02/19 0453 03/05/19 0357 03/06/19 0500  Weight: 115.2 kg 126.9 kg 129.6 kg    Exam:  . General: 58 y.o. year-old male well-developed well-nourished in no acute distress.  Alert and interactive.    . Abdomen: Distended.  Nontender. . Musculoskeletal: Dependent edema all throughout.   Marland Kitchen HEENT:  Normocephalic atraumatic.  Sclerae nonicteric, noninjected.  Extraocular movements intact bilaterally. . Neck:  Without mass or deformity.  Trachea is midline. . Lungs:  Clear to auscultate bilaterally without rhonchi, wheeze, or rales. . Heart:  Regular rate and rhythm.  Without murmurs, rubs, or gallops.   Data Reviewed: CBC: Recent Labs  Lab 02/28/19 0359 03/02/19 0506 03/05/19 0404  WBC 13.1* 16.6* 18.0*  NEUTROABS 11.8*  --  14.6*  HGB 8.6* 9.7* 9.7*  HCT 26.7* 31.2* 30.7*  MCV 95.0 97.2 97.8  PLT 69* 83* 88*   Basic Metabolic Panel: Recent Labs  Lab 02/28/19 0359 03/01/19 0341 03/02/19 0506 03/05/19 0404  NA 129* 131* 129* 130*  K 4.7 4.7 4.4 4.2  CL 92* 94* 94* 97*  CO2 30 29 28 25   GLUCOSE 149* 133* 145* 128*  BUN 66* 68* 74* 90*  CREATININE 2.06* 1.99* 2.03* 1.97*  CALCIUM 9.9 10.3 10.5* 10.5*  MG  --  1.8  --  2.0  PHOS 2.2* 3.3 3.4 4.4   GFR: Estimated Creatinine Clearance: 61.7 mL/min (A) (by C-G formula based on SCr of 1.97 mg/dL (H)). Liver Function Tests: Recent Labs  Lab 03/01/19 0341 03/05/19 0404  AST 27 35  ALT 19 28  ALKPHOS 77 110  BILITOT 1.0 1.1  PROT 5.7* 5.8*  ALBUMIN 1.5* 1.5*   No results for input(s): LIPASE, AMYLASE in the last 168 hours. No results for input(s): AMMONIA in the last 168 hours. Coagulation Profile: No results for input(s): INR, PROTIME  in the last 168 hours. Cardiac Enzymes: No results for input(s): CKTOTAL, CKMB, CKMBINDEX, TROPONINI in the last 168 hours. BNP (last 3 results) No results for input(s): PROBNP in the last 8760 hours. HbA1C: No results for input(s): HGBA1C in the last 72 hours. CBG: Recent Labs  Lab 03/05/19 1649 03/05/19 2004 03/05/19 2351 03/06/19 0329 03/06/19 0833  GLUCAP 155* 142* 136* 106* 195*   Lipid Profile: Recent Labs    03/05/19 0404  TRIG 28   Thyroid Function Tests: No results for input(s): TSH, T4TOTAL, FREET4, T3FREE, THYROIDAB in the last 72 hours. Anemia Panel: No results for input(s): VITAMINB12, FOLATE, FERRITIN, TIBC, IRON, RETICCTPCT in the last 72 hours. Urine analysis:    Component Value Date/Time   COLORURINE YELLOW 02/18/2019 1839   APPEARANCEUR HAZY (A) 02/18/2019 1839   LABSPEC 1.019 02/18/2019 1839   PHURINE 5.0 02/18/2019 1839   GLUCOSEU NEGATIVE 02/18/2019 1839   HGBUR NEGATIVE 02/18/2019 1839   BILIRUBINUR NEGATIVE 02/18/2019 1839   KETONESUR NEGATIVE 02/18/2019 1839   PROTEINUR NEGATIVE 02/18/2019 1839   UROBILINOGEN 1.0 01/19/2013 0015   NITRITE NEGATIVE 02/18/2019 1839   LEUKOCYTESUR NEGATIVE 02/18/2019 1839   Sepsis Labs: @LABRCNTIP (procalcitonin:4,lacticidven:4)  )No results found for this or any previous visit (from the past 240 hour(s)).    Studies: No results found.  Scheduled Meds: . benztropine mesylate  0.5 mg Intramuscular Daily  . Chlorhexidine Gluconate Cloth  6 each Topical Daily  . haloperidol  2 mg Oral Daily  . hydrocortisone sod succinate (SOLU-CORTEF) inj  25 mg Intravenous Q12H  . midodrine  10 mg Oral TID WC  . Muscle Rub   Topical BID  . OLANZapine zydis  5 mg Oral QHS  . pantoprazole (PROTONIX) IV  40 mg Intravenous Q12H  . sodium chloride flush  10-40 mL Intracatheter Q12H    Continuous Infusions: . sodium chloride    . sodium chloride Stopped (02/12/19 1737)  . sodium chloride Stopped (02/19/19 1422)  .  piperacillin-tazobactam (ZOSYN)  IV 12.5 mL/hr at 03/06/19 0227     LOS: 12 days   Little Ishikawa, DO [facility] Pager [phone]  If 7PM-7AM, please contact night-coverage [email or web address] Password [facility] 03/06/2019, 8:39 AM

## 2019-03-07 DIAGNOSIS — K922 Gastrointestinal hemorrhage, unspecified: Secondary | ICD-10-CM

## 2019-03-07 DIAGNOSIS — I8510 Secondary esophageal varices without bleeding: Secondary | ICD-10-CM

## 2019-03-07 LAB — GLUCOSE, CAPILLARY
Glucose-Capillary: 92 mg/dL (ref 70–99)
Glucose-Capillary: 160 mg/dL — ABNORMAL HIGH (ref 70–99)
Glucose-Capillary: 118 mg/dL — ABNORMAL HIGH (ref 70–99)
Glucose-Capillary: 160 mg/dL — ABNORMAL HIGH (ref 70–99)

## 2019-03-07 MED ORDER — ADULT MULTIVITAMIN W/MINERALS CH
1.0000 | ORAL_TABLET | Freq: Every day | ORAL | Status: DC
  Administered 2019-03-07 – 2019-03-16 (×9): 1 via ORAL
  Filled 2019-03-07 (×10): qty 1

## 2019-03-07 MED ORDER — ENSURE ENLIVE PO LIQD
237.0000 mL | Freq: Three times a day (TID) | ORAL | Status: DC
  Administered 2019-03-07 – 2019-03-12 (×11): 237 mL via ORAL

## 2019-03-07 NOTE — Progress Notes (Signed)
Nutrition Follow-up  RD working remotely.  DOCUMENTATION CODES:   Not applicable  INTERVENTION:   -MVI with minerals daily -Ensure Enlive po TID, each supplement provides 350 kcal and 20 grams of protein  NUTRITION DIAGNOSIS:   Increased nutrient needs related to chronic illness(cirrhosis) as evidenced by estimated needs.  Ongoing  GOAL:   Patient will meet greater than or equal to 90% of their needs  Progressing  MONITOR:   PO intake, Supplement acceptance, Labs, Weight trends, Skin, I & O's  REASON FOR ASSESSMENT:   Consult New TPN/TNA  ASSESSMENT:   58 y.o. male with medical history significant of cirrhosis with splenomegaly, esophageal varices, hepatitis C, adrenal insufficiency, hypothyroidism, schizoaffective disorder, PTSD, GI bleed, neuropathy, chronic kidney disease presented to emergency department due to worsening abdominal pain, distention associated with rectal bleeding for 1 day.  Work-up revealed bowel perforation/pneumoperitoneum.  11/13- s/p paracentesis (removed 2860 ml fluid) 11/17- PICC placed 11/18- s/p LLQ paracentesis (5 L clear yellow fluid removed), advanced to clear liquid diet, pt pulled PICC line 11/21- CT revealed large volume pneumoperitoneum with gas between liver and duodenum 11/22- TPN re-started, PICC placed 11/25- PICC replaced by IR 11/29- s/p RLQ paracentesis (14 L clear yellow fluid removed), advanced to clear liquid diet  12/2- advanced to full liquid diet 12/8- TPN d/c 12/9- advanced to soft diet  Reviewed I/O's: +586 ml x 24 hours and +28 L since 02/21/19  UOP: 300 ml x 24 hours  Noted wt gain trend, likely related to edema. Pt net 28 L positive since 02/21/19.   TPN d/c yesterday. Pt tolerating full liquid diet yesterday and has been advanced to a soft diet this morning. Noted meal completion 40-100%. Given increased nutritional needs, pt would greatly benefit from addition of nutritional supplements.   Palliative care  following; pt requesting full aggressive care at this time. Pt awaiting SNF placement for discharge.   Labs reviewed: CBGS: 105-141.   Diet Order:   Diet Order            DIET SOFT Room service appropriate? Yes; Fluid consistency: Thin  Diet effective now              EDUCATION NEEDS:   Not appropriate for education at this time  Skin:  Skin Assessment: Skin Integrity Issues: Skin Integrity Issues:: DTI DTI: rt heel Other: MASD to groin  Last BM:  03/04/19  Height:   Ht Readings from Last 1 Encounters:  02/09/19 6\' 6"  (1.981 m)    Weight:   Wt Readings from Last 1 Encounters:  03/07/19 131.5 kg    Ideal Body Weight:  97 kg  BMI:  Body mass index is 33.5 kg/m.  Estimated Nutritional Needs:   Kcal:  5277-8242  Protein:  125-145 grams  Fluid:  Per MD    Sihaam Chrobak A. Jimmye Norman, RD, LDN, [place] Registered Dietitian II Certified Diabetes Care and Education Specialist Pager: [phone] After hours Pager: [phone]

## 2019-03-07 NOTE — Progress Notes (Signed)
PROGRESS NOTE    [name]  MCR:[id number] DOB: [birth year] DOA: 02/08/2019 PCP: Fleet Contras, MD   Brief Narrative:  58 year old with history of cirrhosis with splenomegaly, hepatitis C, esophageal varices, adrenal insufficiency, hypothyroidism, schizoaffective disorder, PTSD, CKD stage IIIa initially brought to the hospital for worsening abdominal pain, distention and rectal bleeding. Work-up showed perforated bowel and pneumoperitoneum. General surgery recommended nonoperative management with IV antibiotics and bowel rest. Due to worsening of the pain, repeat CT of the abdomen pelvis showed free intra-abdominal fluid and pneumoperitoneum. There is also perforated bowel near pyloric or stomach. Underwent UGI on 11/16 showing focal contained perforation in distal stomach. Palliative care team is following. Psychiatry evaluate the patient and stated does not have capacity to make his own decisions. Assessment & Plan   Severe septic shock secondary to perforated bowel/peritoneum and gastric perforation -Upper GI series showed gastric perforation upon admission 02/08/2019 -GI and general surgery consulted and appreciated -Previous hospitalist had discussion on 03/05/2019 with GI, diet was advanced as tolerated -If patient tolerates diet without worsening abdominal pain distention, fever or leukocytosis consider consider discharge within the next 48 to 72 hours -He has been tolerating his regular diet without issue -Consider repeat imaging after tolerating more advanced diet to ensure no worsening of abdominal pneumoperitoneum low patient mains clinically stable with suspect imaging to be stable as well -Previous hospitalist discussed with infectious disease, recommended to discontinue Zosyn and follow clinically -Patient remains afebrile -Of note he was on TPN which is now been discontinued, on 03/06/2019.  If leukocytosis continues to rise or patient has worsening symptoms or febrile, will  obtain blood cultures given that he is high risk for bloodstream infection given his recent prolonged use of TPN  Resolving acute metabolic encephalopathy -Suspect multifactorial including sepsis versus uncontrolled underlying psychiatric illness -Continue to reorient -Responding well to Zyprexa and Haldol  Decompensated hepatitis C with liver cirrhosis and ascites -Avoid nephrotoxic agents -Last paracentesis was on 02/25/2019 by infiltrative radiology noting 14 L of clear yellow fluid  Hypervolemic hyponatremia -Currently on fluid restriction -Continue to monitor BMP  Hypotension -Likely secondary to decompensated cirrhosis -BP does appear to be stable  -Continue midodrine  Thrombocytopenia -Likely secondary to decompensated hepatitis C/liver cirrhosis -Continue supportive care -continue to monitor   Acute blood loss anemia/gastric and duodenal ulcer -EGD in October 2020 showed grade D esophagitis with nonbleeding gastric ulcer, duodenal ulcer and hiatal hernia -Continue PPI  Hyperglycemia -Hemoglobin A1c was 3.9 02/09/2019 -Hold off of insulin to avoid hypoglycemia  CKD, stage IIIn -Baseline creatinine approximately 1.7-2 -Nephrology consulted and appreciated -Renal ultrasound suggestive of chronic medical renal disease -Continue to monitor BMP and avoid nephrotoxic agents  History of PTSD/schizoaffective disorder -Patient has been seen by psychiatry and deemed not to have capacity to make medical decisions -Family and friends are helping with those decisions  Physical debility/ambulatory dysfunction -PT recommended -Social work consulted and awaiting placement -Continue fall precautions  DVT Prophylaxis  SCDs  Code Status: Full  Family Communication: None at bedside  Disposition Plan: Admitted. Patient medically stable. Pending SNF placement.  Consultants Palliative care Interventional radiology Nephrology General surgery Psychiatry  Gastroenterology   Infectious disease via phone by Dr. Natale Milch  Procedures  Ultrasound-guided paracentesis, yielding 14 L clear yellow fluid on 02/25/2019 PICC line placed by IR on 02/21/2019  Antibiotics   Anti-infectives (From admission, onward)   Start     Dose/Rate Route Frequency Ordered Stop   02/08/19 2200  piperacillin-tazobactam (ZOSYN) IVPB 3.375 g  Status:  Discontinued     3.375 g 12.5 mL/hr over 240 Minutes Intravenous Every 8 hours 02/08/19 1552 03/06/19 1638   02/08/19 1545  piperacillin-tazobactam (ZOSYN) IVPB 3.375 g     3.375 g 100 mL/hr over 30 Minutes Intravenous  Once 02/08/19 1533 02/08/19 1840      Subjective:   [name] seen and examined today.  Patient states he is going to have water removed today.  He denies current chest pain or shortness of breath, states he feels well. Objective:   Vitals:   03/06/19 1958 03/07/19 0500 03/07/19 0549 03/07/19 1428  BP: 126/70  128/75 112/68  Pulse: 83  79 93  Resp: 18  16   Temp: 97.7 F (36.5 C)  97.6 F (36.4 C) (!) 97.4 F (36.3 C)  TempSrc: Oral  Oral Oral  SpO2: 99%  99% 100%  Weight:  131.5 kg    Height:        Intake/Output Summary (Last 24 hours) at 03/07/2019 1552 Last data filed at 03/07/2019 0900 Gross per 24 hour  Intake 1256 ml  Output 550 ml  Net 706 ml   Filed Weights   03/05/19 0357 03/06/19 0500 03/07/19 0500  Weight: 126.9 kg 129.6 kg 131.5 kg    Exam  General: Well developed, chronically ill appearing, NAD  HEENT: NCAT, mucous membranes moist.   Cardiovascular: S1 S2 auscultated, RRR, SEM  Respiratory: Clear to auscultation bilaterally   Abdomen: Soft, nontender, nondistended, + bowel sounds  Extremities: warm dry without cyanosis clubbin. LE edema B?L   Neuro: AAOx1, nonfocal  Skin: jaundice  Psych: Appropriate    Data Reviewed: I have personally reviewed following labs and imaging studies  CBC: Recent Labs  Lab 03/02/19 0506 03/05/19 0404  WBC 16.6* 18.0*  NEUTROABS  --   14.6*  HGB 9.7* 9.7*  HCT 31.2* 30.7*  MCV 97.2 97.8  PLT 83* 88*   Basic Metabolic Panel: Recent Labs  Lab 03/01/19 0341 03/02/19 0506 03/05/19 0404  NA 131* 129* 130*  K 4.7 4.4 4.2  CL 94* 94* 97*  CO2 29 28 25   GLUCOSE 133* 145* 128*  BUN 68* 74* 90*  CREATININE 1.99* 2.03* 1.97*  CALCIUM 10.3 10.5* 10.5*  MG 1.8  --  2.0  PHOS 3.3 3.4 4.4   GFR: Estimated Creatinine Clearance: 62.1 mL/min (A) (by C-G formula based on SCr of 1.97 mg/dL (H)). Liver Function Tests: Recent Labs  Lab 03/01/19 0341 03/05/19 0404  AST 27 35  ALT 19 28  ALKPHOS 77 110  BILITOT 1.0 1.1  PROT 5.7* 5.8*  ALBUMIN 1.5* 1.5*   No results for input(s): LIPASE, AMYLASE in the last 168 hours. No results for input(s): AMMONIA in the last 168 hours. Coagulation Profile: No results for input(s): INR, PROTIME in the last 168 hours. Cardiac Enzymes: No results for input(s): CKTOTAL, CKMB, CKMBINDEX, TROPONINI in the last 168 hours. BNP (last 3 results) No results for input(s): PROBNP in the last 8760 hours. HbA1C: No results for input(s): HGBA1C in the last 72 hours. CBG: Recent Labs  Lab 03/06/19 1236 03/06/19 1606 03/06/19 2003 03/07/19 0811 03/07/19 1226  GLUCAP 129* 109* 105* 92 160*   Lipid Profile: Recent Labs    03/05/19 0404  TRIG 28   Thyroid Function Tests: No results for input(s): TSH, T4TOTAL, FREET4, T3FREE, THYROIDAB in the last 72 hours. Anemia Panel: No results for input(s): VITAMINB12, FOLATE, FERRITIN, TIBC, IRON, RETICCTPCT in the last 72 hours. Urine analysis:  Component Value Date/Time   COLORURINE YELLOW 02/18/2019 1839   APPEARANCEUR HAZY (A) 02/18/2019 1839   LABSPEC 1.019 02/18/2019 1839   PHURINE 5.0 02/18/2019 1839   GLUCOSEU NEGATIVE 02/18/2019 1839   HGBUR NEGATIVE 02/18/2019 1839   BILIRUBINUR NEGATIVE 02/18/2019 1839   KETONESUR NEGATIVE 02/18/2019 1839   PROTEINUR NEGATIVE 02/18/2019 1839   UROBILINOGEN 1.0 01/19/2013 0015   NITRITE  NEGATIVE 02/18/2019 1839   LEUKOCYTESUR NEGATIVE 02/18/2019 1839   Sepsis Labs: @LABRCNTIP (procalcitonin:4,lacticidven:4)  )No results found for this or any previous visit (from the past 240 hour(s)).    Radiology Studies: No results found.   Scheduled Meds: . benztropine mesylate  0.5 mg Intramuscular Daily  . Chlorhexidine Gluconate Cloth  6 each Topical Daily  . feeding supplement (ENSURE ENLIVE)  237 mL Oral TID BM  . haloperidol  2 mg Oral Daily  . hydrocortisone sod succinate (SOLU-CORTEF) inj  25 mg Intravenous Q12H  . midodrine  10 mg Oral TID WC  . multivitamin with minerals  1 tablet Oral Daily  . Muscle Rub   Topical BID  . OLANZapine zydis  5 mg Oral QHS  . pantoprazole  40 mg Oral BID  . sodium chloride flush  10-40 mL Intracatheter Q12H   Continuous Infusions: . sodium chloride    . sodium chloride Stopped (02/12/19 1737)  . sodium chloride Stopped (02/19/19 1422)     LOS: 27 days   Time Spent in minutes   45 minutes  Mileigh Tilley D.O. on 03/07/2019 at 3:52 PM  Between 7am to 7pm - Please see pager noted on [email or web address]  After 7pm go to [email or web address]  And look for the night coverage person covering for me after hours  [facility] Office  [phone]

## 2019-03-07 NOTE — TOC Progression Note (Signed)
Transition of Care [facility]) - Progression Note    Patient Details  Name: [name] MRN: [id number] Date of Birth: September 09, 1960  Transition of Care [facility]) CM/SW [place], [place] Phone Number: 03/07/2019, 3:41 PM  Clinical Narrative:    Pt with no current bed offers.  CSW has sent referrals further through hub. CSW will continue to follow and seek SNF per pt friends preference.    Expected Discharge Plan: [place] Barriers to Discharge: No SNF bed, Continued Medical Work up  Expected Discharge Plan and Services Expected Discharge Plan: [place] In-house Referral: Clinical Social Work Discharge Planning Services: CM Consult Living arrangements for the past 2 months: Apartment  Readmission Risk Interventions Readmission Risk Prevention Plan 01/11/2019  Transportation Screening Complete  Medication Review [employer]) Complete  PCP or Specialist appointment within 3-5 days of discharge Complete  HRI or [place] Complete  SW Recovery Care/Counseling Consult Complete  [place] Not Applicable  Some recent data might be hidden

## 2019-03-08 ENCOUNTER — Inpatient Hospital Stay (HOSPITAL_COMMUNITY): Payer: Medicaid Other

## 2019-03-08 HISTORY — PX: IR PARACENTESIS: IMG2679

## 2019-03-08 LAB — BASIC METABOLIC PANEL
Anion gap: 9 (ref 5–15)
BUN: 99 mg/dL — ABNORMAL HIGH (ref 6–20)
CO2: 22 mmol/L (ref 22–32)
Calcium: 11.1 mg/dL — ABNORMAL HIGH (ref 8.9–10.3)
Chloride: 93 mmol/L — ABNORMAL LOW (ref 98–111)
Creatinine, Ser: 2.26 mg/dL — ABNORMAL HIGH (ref 0.61–1.24)
GFR calc Af Amer: 36 mL/min — ABNORMAL LOW (ref >60)
GFR calc non Af Amer: 31 mL/min — ABNORMAL LOW (ref >60)
Glucose, Bld: 95 mg/dL (ref 70–99)
Potassium: 5.1 mmol/L (ref 3.5–5.1)
Sodium: 124 mmol/L — ABNORMAL LOW (ref 135–145)

## 2019-03-08 LAB — CBC
HCT: 30.7 % — ABNORMAL LOW (ref 39.0–52.0)
Hemoglobin: 9.9 g/dL — ABNORMAL LOW (ref 13.0–17.0)
MCH: 30.7 pg (ref 26.0–34.0)
MCHC: 32.2 g/dL (ref 30.0–36.0)
MCV: 95 fL (ref 80.0–100.0)
Platelets: 114 10*3/uL — ABNORMAL LOW (ref 150–400)
RBC: 3.23 MIL/uL — ABNORMAL LOW (ref 4.22–5.81)
RDW: 18.6 % — ABNORMAL HIGH (ref 11.5–15.5)
WBC: 22.3 10*3/uL — ABNORMAL HIGH (ref 4.0–10.5)
nRBC: 0 % (ref 0.0–0.2)

## 2019-03-08 LAB — GLUCOSE, CAPILLARY
Glucose-Capillary: 97 mg/dL (ref 70–99)
Glucose-Capillary: 147 mg/dL — ABNORMAL HIGH (ref 70–99)
Glucose-Capillary: 96 mg/dL (ref 70–99)
Glucose-Capillary: 109 mg/dL — ABNORMAL HIGH (ref 70–99)

## 2019-03-08 MED ORDER — LIDOCAINE HCL 1 % IJ SOLN
INTRAMUSCULAR | Status: AC
  Filled 2019-03-08: qty 20

## 2019-03-08 MED ORDER — ALBUMIN HUMAN 25 % IV SOLN
25.0000 g | Freq: Once | INTRAVENOUS | Status: AC
  Administered 2019-03-08: 25 g via INTRAVENOUS
  Filled 2019-03-08: qty 100

## 2019-03-08 NOTE — Progress Notes (Addendum)
PROGRESS NOTE    [name]  WNU:[id number] DOB: 06-10-1960 DOA: 02/08/2019 PCP: Benjamin Ebbs, MD   Brief Narrative:  58 year old with history of cirrhosis with splenomegaly, hepatitis C, esophageal varices, adrenal insufficiency, hypothyroidism, schizoaffective disorder, PTSD, CKD stage IIIa initially brought to the hospital for worsening abdominal pain, distention and rectal bleeding. Work-up showed perforated bowel and pneumoperitoneum. General surgery recommended nonoperative management with IV antibiotics and bowel rest. Due to worsening of the pain, repeat CT of the abdomen pelvis showed free intra-abdominal fluid and pneumoperitoneum. There is also perforated bowel near pyloric or stomach. Underwent UGI on 11/16 showing focal contained perforation in distal stomach. Palliative care team is following. Psychiatry evaluate the patient and stated does not have capacity to make his own decisions. Assessment & Plan   Severe septic shock secondary to perforated bowel/peritoneum and gastric perforation -Upper GI series showed gastric perforation upon admission 02/08/2019 -GI and general surgery consulted and appreciated -Previous hospitalist had discussion on 03/05/2019 with GI, diet was advanced as tolerated- If patient tolerates diet without worsening abdominal pain distention, fever or leukocytosis consider discharge within the next 48 to 72 hours -He has been tolerating his regular diet without issue -Consider repeat imaging after tolerating more advanced diet to ensure no worsening of abdominal pneumoperitoneum low patient mains clinically stable with suspect imaging to be stable as well -Previous hospitalist discussed with infectious disease, recommended to discontinue Zosyn and follow clinically -Patient remains afebrile -Of note he was on TPN which is now been discontinued, on 03/06/2019.  If leukocytosis continues to rise or patient has worsening symptoms or febrile, will obtain  blood cultures given that he is high risk for bloodstream infection given his recent prolonged use of TPN -Unfortunately WBC count has been trending upward for several days.  Pending fluid culture results.  Will place on ceftriaxone empirically.  Resolving acute metabolic encephalopathy -Suspect multifactorial including sepsis versus uncontrolled underlying psychiatric illness -Continue to reorient -Responding well to Zyprexa and Haldol  Decompensated hepatitis C with liver cirrhosis and ascites -Avoid nephrotoxic agents -Last paracentesis was on 02/25/2019 by infiltrative radiology noting 14 L of clear yellow fluid -S/p paracentesis today, yielding 9L  -Albumin given  Hypervolemic hyponatremia -Currently on fluid restriction -Continue to monitor BMP  Hypotension -Likely secondary to decompensated cirrhosis -BP does appear to be stable  -Continue midodrine, solu-cortef  Thrombocytopenia -Likely secondary to decompensated hepatitis C/liver cirrhosis -platelets improving  -Continue supportive care -continue to monitor   Acute blood loss anemia/gastric and duodenal ulcer -EGD in October 2020 showed grade D esophagitis with nonbleeding gastric ulcer, duodenal ulcer and hiatal hernia -Continue PPI  Hyperglycemia -Hemoglobin A1c was 3.9 02/09/2019 -Hold off of insulin to avoid hypoglycemia  CKD, stage IIIb -Baseline creatinine approximately 1.7-2 -Nephrology consulted and appreciated -Renal ultrasound suggestive of chronic medical renal disease -Continue to monitor BMP and avoid nephrotoxic agents -creatinine today 2.26, however GFR still within stage 3b  History of PTSD/schizoaffective disorder -Patient has been seen by psychiatry and deemed not to have capacity to make medical decisions -Family and friends are helping with those decisions  Physical debility/ambulatory dysfunction -PT recommended -Social work consulted and awaiting placement -Continue fall  precautions  DVT Prophylaxis  SCDs  Code Status: Full  Family Communication: None at bedside  Disposition Plan: Admitted. Patient medically stable. Pending SNF placement.  Consultants Palliative care Interventional radiology Nephrology General surgery Psychiatry  Gastroenterology  Infectious disease via phone by Dr. Avon Gully  Procedures  Ultrasound-guided paracentesis, yielding 14 L clear yellow fluid  on 02/25/2019 PICC line placed by IR on 02/21/2019  Antibiotics   Anti-infectives (From admission, onward)   Start     Dose/Rate Route Frequency Ordered Stop   02/08/19 2200  piperacillin-tazobactam (ZOSYN) IVPB 3.375 g  Status:  Discontinued     3.375 g 12.5 mL/hr over 240 Minutes Intravenous Every 8 hours 02/08/19 1552 03/06/19 1638   02/08/19 1545  piperacillin-tazobactam (ZOSYN) IVPB 3.375 g     3.375 g 100 mL/hr over 30 Minutes Intravenous  Once 02/08/19 1533 02/08/19 1840      Subjective:   [name] seen and examined today.  Patient feels he has fluid in his abdomen.  He currently denies chest pain or shortness of breath, denies abdominal pain, nausea or vomiting, dizziness or headache. Objective:   Vitals:   03/07/19 2020 03/08/19 0450 03/08/19 0500 03/08/19 1113  BP: 123/72 114/68  125/64  Pulse: 91 93  92  Resp: 18 19  16   Temp: 98.1 F (36.7 C) 98 F (36.7 C)  98.4 F (36.9 C)  TempSrc: Oral Oral  Oral  SpO2: 97% 95%  99%  Weight:   132 kg   Height:        Intake/Output Summary (Last 24 hours) at 03/08/2019 1451 Last data filed at 03/08/2019 1054 Gross per 24 hour  Intake 1407 ml  Output 9175 ml  Net -7768 ml   Filed Weights   03/06/19 0500 03/07/19 0500 03/08/19 0500  Weight: 129.6 kg 131.5 kg 132 kg   Exam  General: Well developed, chronically ill appearing, NAD  HEENT: NCAT,, mucous membranes moist.   Cardiovascular: S1 S2 auscultated, RRR, SEM  Respiratory: Clear to auscultation bilaterally with equal chest rise  Abdomen:  Soft, nontender, nondistended, + bowel sounds  Extremities: warm dry without cyanosis clubbing. +3LE edema B/L   Neuro: AAOx1, nonfocal  Psych: Appropriate mood and affect  Data Reviewed: I have personally reviewed following labs and imaging studies  CBC: Recent Labs  Lab 03/02/19 0506 03/05/19 0404 03/08/19 0500  WBC 16.6* 18.0* 22.3*  NEUTROABS  --  14.6*  --   HGB 9.7* 9.7* 9.9*  HCT 31.2* 30.7* 30.7*  MCV 97.2 97.8 95.0  PLT 83* 88* 114*   Basic Metabolic Panel: Recent Labs  Lab 03/02/19 0506 03/05/19 0404 03/08/19 0500  NA 129* 130* 124*  K 4.4 4.2 5.1  CL 94* 97* 93*  CO2 28 25 22   GLUCOSE 145* 128* 95  BUN 74* 90* 99*  CREATININE 2.03* 1.97* 2.26*  CALCIUM 10.5* 10.5* 11.1*  MG  --  2.0  --   PHOS 3.4 4.4  --    GFR: Estimated Creatinine Clearance: 54.2 mL/min (A) (by C-G formula based on SCr of 2.26 mg/dL (H)). Liver Function Tests: Recent Labs  Lab 03/05/19 0404  AST 35  ALT 28  ALKPHOS 110  BILITOT 1.1  PROT 5.8*  ALBUMIN 1.5*   No results for input(s): LIPASE, AMYLASE in the last 168 hours. No results for input(s): AMMONIA in the last 168 hours. Coagulation Profile: No results for input(s): INR, PROTIME in the last 168 hours. Cardiac Enzymes: No results for input(s): CKTOTAL, CKMB, CKMBINDEX, TROPONINI in the last 168 hours. BNP (last 3 results) No results for input(s): PROBNP in the last 8760 hours. HbA1C: No results for input(s): HGBA1C in the last 72 hours. CBG: Recent Labs  Lab 03/07/19 1226 03/07/19 1724 03/07/19 2136 03/08/19 0735 03/08/19 1209  GLUCAP 160* 118* 160* 97 147*   Lipid Profile: No  results for input(s): CHOL, HDL, LDLCALC, TRIG, CHOLHDL, LDLDIRECT in the last 72 hours. Thyroid Function Tests: No results for input(s): TSH, T4TOTAL, FREET4, T3FREE, THYROIDAB in the last 72 hours. Anemia Panel: No results for input(s): VITAMINB12, FOLATE, FERRITIN, TIBC, IRON, RETICCTPCT in the last 72 hours. Urine analysis:     Component Value Date/Time   COLORURINE YELLOW 02/18/2019 1839   APPEARANCEUR HAZY (A) 02/18/2019 1839   LABSPEC 1.019 02/18/2019 1839   PHURINE 5.0 02/18/2019 1839   GLUCOSEU NEGATIVE 02/18/2019 1839   HGBUR NEGATIVE 02/18/2019 1839   BILIRUBINUR NEGATIVE 02/18/2019 1839   KETONESUR NEGATIVE 02/18/2019 1839   PROTEINUR NEGATIVE 02/18/2019 1839   UROBILINOGEN 1.0 01/19/2013 0015   NITRITE NEGATIVE 02/18/2019 1839   LEUKOCYTESUR NEGATIVE 02/18/2019 1839   Sepsis Labs: @LABRCNTIP (procalcitonin:4,lacticidven:4)  )No results found for this or any previous visit (from the past 240 hour(s)).    Radiology Studies: IR Paracentesis  Result Date: 03/08/2019 INDICATION: Patient with history of cirrhosis and recurrent ascites. Request to IR for therapeutic paracentesis with post procedure albumin administration. EXAM: ULTRASOUND GUIDED THERAPEUTIC PARACENTESIS MEDICATIONS: 10 mL 1% lidocaine COMPLICATIONS: None immediate. PROCEDURE: Informed written consent was obtained from the patient after a discussion of the risks, benefits and alternatives to treatment. A timeout was performed prior to the initiation of the procedure. Initial ultrasound scanning demonstrates a large amount of ascites within the left lower abdominal quadrant. The left lower abdomen was prepped and draped in the usual sterile fashion. 1% lidocaine was used for local anesthesia. Following this, a 19 gauge, 10 cm, Yueh catheter was introduced. An ultrasound image was saved for documentation purposes. The paracentesis was performed. The catheter was removed and a dressing was applied. The patient tolerated the procedure well without immediate post procedural complication. Patient received post-procedure intravenous albumin; see nursing notes for details. FINDINGS: A total of approximately 9.0 L of clear yellow fluid was removed. IMPRESSION: Successful ultrasound-guided paracentesis yielding 9.0 liters of peritoneal fluid. Read by  Lynnette Caffey, PA-C Electronically Signed   By: Richarda Overlie M.D.   On: 03/08/2019 11:04     Scheduled Meds:  benztropine mesylate  0.5 mg Intramuscular Daily   Chlorhexidine Gluconate Cloth  6 each Topical Daily   feeding supplement (ENSURE ENLIVE)  237 mL Oral TID BM   haloperidol  2 mg Oral Daily   hydrocortisone sod succinate (SOLU-CORTEF) inj  25 mg Intravenous Q12H   lidocaine       midodrine  10 mg Oral TID WC   multivitamin with minerals  1 tablet Oral Daily   Muscle Rub   Topical BID   OLANZapine zydis  5 mg Oral QHS   pantoprazole  40 mg Oral BID   sodium chloride flush  10-40 mL Intracatheter Q12H   Continuous Infusions:  sodium chloride     sodium chloride Stopped (02/12/19 1737)   sodium chloride Stopped (02/19/19 1422)     LOS: 28 days   Time Spent in minutes   45 minutes  Nadine Ryle D.O. on 03/08/2019 at 2:51 PM  Between 7am to 7pm - Please see pager noted on [email or web address]  After 7pm go to [email or web address]  And look for the night coverage person covering for me after hours  [facility] Office  [phone]

## 2019-03-08 NOTE — Procedures (Signed)
PROCEDURE SUMMARY:  Successful image-guided paracentesis from the left lower abdomen.  Yielded 9.0 liters of clear yellow fluid.  No immediate complications.  EBL: zero Patient tolerated well.   Specimen was not sent for labs.  Patient to receive post procedure albumin.   Please see imaging section of Epic for full dictation.  Joaquim Nam PA-C 03/08/2019 9:54 AM

## 2019-03-08 NOTE — Progress Notes (Signed)
PT Cancellation Note  Patient Details Name: [name] MRN: [id number] DOB: [birth year]   Cancelled Treatment:    Reason Eval/Treat Not Completed: Patient declined, no reason specified(pt refused to state his name, attempt any mobility or participate in any way. Pt stated "I'm tired" "leave me alone" " today I do nothing")   [name] [name] 03/08/2019, 8:51 AM  [place] Pager: [phone] Office: [phone]

## 2019-03-09 ENCOUNTER — Inpatient Hospital Stay (HOSPITAL_COMMUNITY): Payer: Medicaid Other

## 2019-03-09 HISTORY — PX: IR PARACENTESIS: IMG2679

## 2019-03-09 LAB — GLUCOSE, CAPILLARY
Glucose-Capillary: 110 mg/dL — ABNORMAL HIGH (ref 70–99)
Glucose-Capillary: 84 mg/dL (ref 70–99)
Glucose-Capillary: 87 mg/dL (ref 70–99)
Glucose-Capillary: 110 mg/dL — ABNORMAL HIGH (ref 70–99)

## 2019-03-09 LAB — BASIC METABOLIC PANEL
Anion gap: 8 (ref 5–15)
BUN: 104 mg/dL — ABNORMAL HIGH (ref 6–20)
CO2: 23 mmol/L (ref 22–32)
Calcium: 11.3 mg/dL — ABNORMAL HIGH (ref 8.9–10.3)
Chloride: 94 mmol/L — ABNORMAL LOW (ref 98–111)
Creatinine, Ser: 2.46 mg/dL — ABNORMAL HIGH (ref 0.61–1.24)
GFR calc Af Amer: 32 mL/min — ABNORMAL LOW (ref >60)
GFR calc non Af Amer: 28 mL/min — ABNORMAL LOW (ref >60)
Glucose, Bld: 85 mg/dL (ref 70–99)
Potassium: 5.1 mmol/L (ref 3.5–5.1)
Sodium: 125 mmol/L — ABNORMAL LOW (ref 135–145)

## 2019-03-09 LAB — CBC
HCT: 27.8 % — ABNORMAL LOW (ref 39.0–52.0)
Hemoglobin: 8.9 g/dL — ABNORMAL LOW (ref 13.0–17.0)
MCH: 30.3 pg (ref 26.0–34.0)
MCHC: 32 g/dL (ref 30.0–36.0)
MCV: 94.6 fL (ref 80.0–100.0)
Platelets: 66 10*3/uL — ABNORMAL LOW (ref 150–400)
RBC: 2.94 MIL/uL — ABNORMAL LOW (ref 4.22–5.81)
RDW: 18.4 % — ABNORMAL HIGH (ref 11.5–15.5)
WBC: 15.2 10*3/uL — ABNORMAL HIGH (ref 4.0–10.5)
nRBC: 0 % (ref 0.0–0.2)

## 2019-03-09 LAB — GLUCOSE, PLEURAL OR PERITONEAL FLUID: Glucose, Fluid: <20 mg/dL

## 2019-03-09 MED ORDER — SODIUM CHLORIDE 0.9 % IV SOLN
2.0000 g | INTRAVENOUS | Status: DC
  Administered 2019-03-09 – 2019-03-26 (×18): 2 g via INTRAVENOUS
  Filled 2019-03-09: qty 20
  Filled 2019-03-09 (×5): qty 2
  Filled 2019-03-09 (×2): qty 20
  Filled 2019-03-09 (×2): qty 2
  Filled 2019-03-09: qty 20
  Filled 2019-03-09 (×7): qty 2
  Filled 2019-03-09: qty 20

## 2019-03-09 MED ORDER — LIDOCAINE HCL 1 % IJ SOLN
INTRAMUSCULAR | Status: AC
  Filled 2019-03-09: qty 20

## 2019-03-09 MED ORDER — FUROSEMIDE 10 MG/ML IJ SOLN
40.0000 mg | Freq: Once | INTRAMUSCULAR | Status: AC
  Administered 2019-03-09: 40 mg via INTRAVENOUS
  Filled 2019-03-09: qty 4

## 2019-03-09 NOTE — Progress Notes (Signed)
PROGRESS NOTE    [name]  ZOX:[id number] DOB: 09/09/1960 DOA: 02/08/2019 PCP: Fleet Hull, Edwin, MD   Brief Narrative:  58 year old with history of cirrhosis with splenomegaly, hepatitis C, esophageal varices, adrenal insufficiency, hypothyroidism, schizoaffective disorder, PTSD, CKD stage IIIa initially brought to the hospital for worsening abdominal pain, distention and rectal bleeding. Work-up showed perforated bowel and pneumoperitoneum. General surgery recommended nonoperative management with IV antibiotics and bowel rest. Due to worsening of the pain, repeat CT of the abdomen pelvis showed free intra-abdominal fluid and pneumoperitoneum. There is also perforated bowel near pyloric or stomach. Underwent UGI on 11/16 showing focal contained perforation in distal stomach. Palliative care team is following. Psychiatry evaluate the patient and stated does not have capacity to make his own decisions. Assessment & Plan   Severe septic shock secondary to perforated bowel/peritoneum and gastric perforation -Upper GI series showed gastric perforation upon admission 02/08/2019 -GI and general surgery consulted and appreciated -Previous hospitalist had discussion on 03/05/2019 with GI, diet was advanced as tolerated- If patient tolerates diet without worsening abdominal pain distention, fever or leukocytosis consider discharge within the next 48 to 72 hours -He has been tolerating his regular diet without issue -Consider repeat imaging after tolerating more advanced diet to ensure no worsening of abdominal pneumoperitoneum low patient mains clinically stable with suspect imaging to be stable as well -Previous hospitalist discussed with infectious disease, recommended to discontinue Zosyn and follow clinically -Patient remains afebrile -Of note he was on TPN which is now been discontinued, on 03/06/2019.  If leukocytosis continues to rise or patient has worsening symptoms or febrile, will obtain  blood cultures given that he is high risk for bloodstream infection given his recent prolonged use of TPN -Unfortunately WBC count has been trending upward for several days.  Pending fluid culture results.  Placed on ceftriaxone empirically.  Resolving acute metabolic encephalopathy -Suspect multifactorial including sepsis versus uncontrolled underlying psychiatric illness -Continue to reorient -Responding well to Zyprexa and Haldol  Decompensated hepatitis C with liver cirrhosis and ascites -Avoid hepatotoxic agents -Last paracentesis was on 02/25/2019 by infiltrative radiology noting 14 L of clear yellow fluid -S/p paracentesis today, yielding 9L - pending fluid labs -Albumin was given on 03/08/2019 -will give small dose of lasix today and monitor   Hypervolemic hyponatremia -Currently on fluid restriction, sodium currently 125 -will give low dose lasix  -Continue to monitor BMP  Hypotension -Likely secondary to decompensated cirrhosis -BP does appear to be stable  -Continue midodrine, solu-cortef  Thrombocytopenia -Likely secondary to decompensated hepatitis C/liver cirrhosis -platelets improving  -Continue supportive care -continue to monitor   Acute blood loss anemia/gastric and duodenal ulcer -EGD in October 2020 showed grade D esophagitis with nonbleeding gastric ulcer, duodenal ulcer and hiatal hernia -Continue PPI  Hyperglycemia -Hemoglobin A1c was 3.9 02/09/2019 -Hold off of insulin to avoid hypoglycemia  Acute kidney injury on CKD, stage IIIb -Baseline creatinine approximately 1.7-2 -Nephrology consulted and appreciated -Renal ultrasound suggestive of chronic medical renal disease -Continue to monitor BMP and avoid nephrotoxic agents -creatinine upto 2.46 today -will give small dose of lasix and monitor BMP  History of PTSD/schizoaffective disorder -Patient has been seen by psychiatry and deemed not to have capacity to make medical decisions -Family and  friends are helping with those decisions  Physical debility/ambulatory dysfunction -PT recommended -Social work consulted and awaiting placement -Continue fall precautions  DVT Prophylaxis  SCDs  Code Status: Full  Family Communication: None at bedside  Disposition Plan: Admitted. When patient is medically stable,  will likely need SNF. Difficult placement.   Consultants Palliative care Interventional radiology Nephrology General surgery Psychiatry  Gastroenterology  Infectious disease via phone by Dr. Avon Gully  Procedures  Ultrasound-guided paracentesis, yielding 14 L clear yellow fluid on 02/25/2019 PICC line placed by IR on 02/21/2019  Antibiotics   Anti-infectives (From admission, onward)   Start     Dose/Rate Route Frequency Ordered Stop   03/09/19 0800  cefTRIAXone (ROCEPHIN) 2 g in sodium chloride 0.9 % 100 mL IVPB     2 g 200 mL/hr over 30 Minutes Intravenous Every 24 hours 03/09/19 0703     02/08/19 2200  piperacillin-tazobactam (ZOSYN) IVPB 3.375 g  Status:  Discontinued     3.375 g 12.5 mL/hr over 240 Minutes Intravenous Every 8 hours 02/08/19 1552 03/06/19 1638   02/08/19 1545  piperacillin-tazobactam (ZOSYN) IVPB 3.375 g     3.375 g 100 mL/hr over 30 Minutes Intravenous  Once 02/08/19 1533 02/08/19 1840      Subjective:   [name] seen and examined today.  Feels abdominal distention is better. Asking for his medications this morning. Denies current chest pain, shortness of breath, abdominal pain, N/V/D/C.  Objective:   Vitals:   03/08/19 1113 03/08/19 1641 03/08/19 2035 03/09/19 0439  BP: 125/64 117/61 128/69 133/72  Pulse: 92 85 92 86  Resp: 16 19 16 18   Temp: 98.4 F (36.9 C) 97.8 F (36.6 C) 97.9 F (36.6 C) 97.8 F (36.6 C)  TempSrc: Oral Oral Oral Oral  SpO2: 99% 99% 97% 100%  Weight:      Height:        Intake/Output Summary (Last 24 hours) at 03/09/2019 1026 Last data filed at 03/09/2019 0900 Gross per 24 hour  Intake 360 ml    Output 9770 ml  Net -9410 ml   Filed Weights   03/06/19 0500 03/07/19 0500 03/08/19 0500  Weight: 129.6 kg 131.5 kg 132 kg   Exam  General: Well developed, chronically ill appearing, NAD  HEENT: NCAT, mucous membranes moist.   Cardiovascular: S1 S2 auscultated, RRR, SEM  Respiratory: Clear to auscultation bilaterally  Abdomen: Soft, nontender, nondistended, + bowel sounds  Extremities: warm dry without cyanosis clubbing. +3LE edema B/L   Neuro: AAOx2 (self, place), nonfocal  Psych: Appropriate mood and affect  Data Reviewed: I have personally reviewed following labs and imaging studies  CBC: Recent Labs  Lab 03/05/19 0404 03/08/19 0500 03/09/19 0504  WBC 18.0* 22.3* 15.2*  NEUTROABS 14.6*  --   --   HGB 9.7* 9.9* 8.9*  HCT 30.7* 30.7* 27.8*  MCV 97.8 95.0 94.6  PLT 88* 114* 66*   Basic Metabolic Panel: Recent Labs  Lab 03/05/19 0404 03/08/19 0500 03/09/19 0504  NA 130* 124* 125*  K 4.2 5.1 5.1  CL 97* 93* 94*  CO2 25 22 23   GLUCOSE 128* 95 85  BUN 90* 99* 104*  CREATININE 1.97* 2.26* 2.46*  CALCIUM 10.5* 11.1* 11.3*  MG 2.0  --   --   PHOS 4.4  --   --    GFR: Estimated Creatinine Clearance: 49.8 mL/min (A) (by C-G formula based on SCr of 2.46 mg/dL (H)). Liver Function Tests: Recent Labs  Lab 03/05/19 0404  AST 35  ALT 28  ALKPHOS 110  BILITOT 1.1  PROT 5.8*  ALBUMIN 1.5*   No results for input(s): LIPASE, AMYLASE in the last 168 hours. No results for input(s): AMMONIA in the last 168 hours. Coagulation Profile: No results for input(s): INR, PROTIME  in the last 168 hours. Cardiac Enzymes: No results for input(s): CKTOTAL, CKMB, CKMBINDEX, TROPONINI in the last 168 hours. BNP (last 3 results) No results for input(s): PROBNP in the last 8760 hours. HbA1C: No results for input(s): HGBA1C in the last 72 hours. CBG: Recent Labs  Lab 03/08/19 0735 03/08/19 1209 03/08/19 1639 03/08/19 2122 03/09/19 0753  GLUCAP 97 147* 96 109* 110*    Lipid Profile: No results for input(s): CHOL, HDL, LDLCALC, TRIG, CHOLHDL, LDLDIRECT in the last 72 hours. Thyroid Function Tests: No results for input(s): TSH, T4TOTAL, FREET4, T3FREE, THYROIDAB in the last 72 hours. Anemia Panel: No results for input(s): VITAMINB12, FOLATE, FERRITIN, TIBC, IRON, RETICCTPCT in the last 72 hours. Urine analysis:    Component Value Date/Time   COLORURINE YELLOW 02/18/2019 1839   APPEARANCEUR HAZY (A) 02/18/2019 1839   LABSPEC 1.019 02/18/2019 1839   PHURINE 5.0 02/18/2019 1839   GLUCOSEU NEGATIVE 02/18/2019 1839   HGBUR NEGATIVE 02/18/2019 1839   BILIRUBINUR NEGATIVE 02/18/2019 1839   KETONESUR NEGATIVE 02/18/2019 1839   PROTEINUR NEGATIVE 02/18/2019 1839   UROBILINOGEN 1.0 01/19/2013 0015   NITRITE NEGATIVE 02/18/2019 1839   LEUKOCYTESUR NEGATIVE 02/18/2019 1839   Sepsis Labs: @LABRCNTIP (procalcitonin:4,lacticidven:4)  )No results found for this or any previous visit (from the past 240 hour(s)).    Radiology Studies: IR Paracentesis  Result Date: 03/08/2019 INDICATION: Patient with history of cirrhosis and recurrent ascites. Request to IR for therapeutic paracentesis with post procedure albumin administration. EXAM: ULTRASOUND GUIDED THERAPEUTIC PARACENTESIS MEDICATIONS: 10 mL 1% lidocaine COMPLICATIONS: None immediate. PROCEDURE: Informed written consent was obtained from the patient after a discussion of the risks, benefits and alternatives to treatment. A timeout was performed prior to the initiation of the procedure. Initial ultrasound scanning demonstrates a large amount of ascites within the left lower abdominal quadrant. The left lower abdomen was prepped and draped in the usual sterile fashion. 1% lidocaine was used for local anesthesia. Following this, a 19 gauge, 10 cm, Yueh catheter was introduced. An ultrasound image was saved for documentation purposes. The paracentesis was performed. The catheter was removed and a dressing was  applied. The patient tolerated the procedure well without immediate post procedural complication. Patient received post-procedure intravenous albumin; see nursing notes for details. FINDINGS: A total of approximately 9.0 L of clear yellow fluid was removed. IMPRESSION: Successful ultrasound-guided paracentesis yielding 9.0 liters of peritoneal fluid. Read by 14/12/2018, PA-C Electronically Signed   By: Lynnette Caffey M.D.   On: 03/08/2019 11:04     Scheduled Meds: . benztropine mesylate  0.5 mg Intramuscular Daily  . Chlorhexidine Gluconate Cloth  6 each Topical Daily  . feeding supplement (ENSURE ENLIVE)  237 mL Oral TID BM  . haloperidol  2 mg Oral Daily  . hydrocortisone sod succinate (SOLU-CORTEF) inj  25 mg Intravenous Q12H  . midodrine  10 mg Oral TID WC  . multivitamin with minerals  1 tablet Oral Daily  . Muscle Rub   Topical BID  . OLANZapine zydis  5 mg Oral QHS  . pantoprazole  40 mg Oral BID  . sodium chloride flush  10-40 mL Intracatheter Q12H   Continuous Infusions: . sodium chloride    . sodium chloride Stopped (02/12/19 1737)  . sodium chloride Stopped (02/19/19 1422)  . cefTRIAXone (ROCEPHIN)  IV       LOS: 29 days   Time Spent in minutes   45 minutes  Ivanka Kirshner D.O. on 03/09/2019 at 10:26 AM  Between 7am to 7pm -  Please see pager noted on [email or web address]  After 7pm go to [email or web address]  And look for the night coverage person covering for me after hours  [facility] Office  [phone]

## 2019-03-09 NOTE — Progress Notes (Signed)
Patient transported off unit to IR.

## 2019-03-09 NOTE — Progress Notes (Signed)
CRITICAL VALUE ALERT  Critical Value:  Body fluid glucose less than 20  Date & Time Notied:  03/09/2019 1521  Provider Notified: Dr. Ree Kida  Orders Received/Actions taken: no new orders received at this time.

## 2019-03-09 NOTE — TOC Progression Note (Addendum)
Transition of Care [facility]) - Progression Note    Patient Details  Name: [name] MRN: [id number] Date of Birth: March 12, 1961  Transition of Care [facility] CM/SW [place], [place] Phone Number: 03/09/2019, 11:08 AM  Clinical Narrative:    2:37pm- Still no response from SNF regarding offer.   11:08am- CSW contacted [facility] regarding bed offer.  Await return call. CSW has no further offers at this time for placement.    Expected Discharge Plan: [place] Barriers to Discharge: No SNF bed, Continued Medical Work up  Expected Discharge Plan and Services Expected Discharge Plan: [place] In-house Referral: Clinical Social Work Discharge Planning Services: CM Consult   Living arrangements for the past 2 months: Apartment     Readmission Risk Interventions Readmission Risk Prevention Plan 01/11/2019  Transportation Screening Complete  Medication Review Press photographer) Complete  PCP or Specialist appointment within 3-5 days of discharge Complete  HRI or [place] Complete  SW Recovery Care/Counseling Consult Complete  [place] Not Applicable  Some recent data might be hidden

## 2019-03-09 NOTE — Procedures (Signed)
PROCEDURE SUMMARY:  Ascites appears turbid/debris filled today compared to previous paras. Successful US guided paracentesis from LLQ.  Yielded 1 L of thick turbid beige fluid.  No immediate complications.  Pt tolerated well.   Specimen was sent for labs.  EBL < 55mL  Ascencion Dike PA-C 03/09/2019 2:43 PM

## 2019-03-10 DIAGNOSIS — M069 Rheumatoid arthritis, unspecified: Secondary | ICD-10-CM

## 2019-03-10 LAB — CBC
HCT: 27.6 % — ABNORMAL LOW (ref 39.0–52.0)
Hemoglobin: 9.1 g/dL — ABNORMAL LOW (ref 13.0–17.0)
MCH: 30.7 pg (ref 26.0–34.0)
MCHC: 33 g/dL (ref 30.0–36.0)
MCV: 93.2 fL (ref 80.0–100.0)
Platelets: 71 10*3/uL — ABNORMAL LOW (ref 150–400)
RBC: 2.96 MIL/uL — ABNORMAL LOW (ref 4.22–5.81)
RDW: 18.6 % — ABNORMAL HIGH (ref 11.5–15.5)
WBC: 22.6 10*3/uL — ABNORMAL HIGH (ref 4.0–10.5)
nRBC: 0 % (ref 0.0–0.2)

## 2019-03-10 LAB — BODY FLUID CELL COUNT WITH DIFFERENTIAL
Eos, Fluid: NONE SEEN %
Lymphs, Fluid: 3 %
Monocyte-Macrophage-Serous Fluid: 2 % — ABNORMAL LOW (ref 50–90)
Neutrophil Count, Fluid: 95 % — ABNORMAL HIGH (ref 0–25)
Total Nucleated Cell Count, Fluid: 92400 cu mm — ABNORMAL HIGH (ref 0–1000)

## 2019-03-10 LAB — BASIC METABOLIC PANEL
Anion gap: 8 (ref 5–15)
BUN: 109 mg/dL — ABNORMAL HIGH (ref 6–20)
CO2: 23 mmol/L (ref 22–32)
Calcium: 11.2 mg/dL — ABNORMAL HIGH (ref 8.9–10.3)
Chloride: 96 mmol/L — ABNORMAL LOW (ref 98–111)
Creatinine, Ser: 2.4 mg/dL — ABNORMAL HIGH (ref 0.61–1.24)
GFR calc Af Amer: 33 mL/min — ABNORMAL LOW (ref >60)
GFR calc non Af Amer: 29 mL/min — ABNORMAL LOW (ref >60)
Glucose, Bld: 105 mg/dL — ABNORMAL HIGH (ref 70–99)
Potassium: 5.2 mmol/L — ABNORMAL HIGH (ref 3.5–5.1)
Sodium: 127 mmol/L — ABNORMAL LOW (ref 135–145)

## 2019-03-10 LAB — GLUCOSE, CAPILLARY
Glucose-Capillary: 93 mg/dL (ref 70–99)
Glucose-Capillary: 109 mg/dL — ABNORMAL HIGH (ref 70–99)
Glucose-Capillary: 113 mg/dL — ABNORMAL HIGH (ref 70–99)
Glucose-Capillary: 100 mg/dL — ABNORMAL HIGH (ref 70–99)

## 2019-03-10 MED ORDER — FUROSEMIDE 10 MG/ML IJ SOLN
20.0000 mg | Freq: Once | INTRAMUSCULAR | Status: AC
  Administered 2019-03-10: 20 mg via INTRAVENOUS
  Filled 2019-03-10: qty 2

## 2019-03-10 MED ORDER — GABAPENTIN 100 MG PO CAPS
100.0000 mg | ORAL_CAPSULE | Freq: Every day | ORAL | Status: DC
  Administered 2019-03-10 – 2019-03-25 (×16): 100 mg via ORAL
  Filled 2019-03-10 (×16): qty 1

## 2019-03-10 NOTE — Progress Notes (Signed)
Patient ID: [name], male   DOB: 04-Jan-1961, 58 y.o.   MRN: [id number]  Asked to reconsult: No notes suggesting assessment by any other providers today.   CT report from yesterday:  1. Large volume pneumoperitoneum, decreased from prior study. The exact site of bowel perforation is not identified on this exam. 2. Large volume abdominal free fluid, decreased from prior study. 3. Probable peritonitis, although not well evaluated in the absence of IV contrast. 4. Anasarca. 5. Bilateral pleural effusions. 6. Cirrhosis with stigmata of portal hypertension.  The patient is seen and appears comfortable.  His work of breathing is not elevated.  His abdomen is soft and not distended (as with ascites) and is nontender. His aspirate yesterday showed turbid fluid and debris.  His disposition is complicated by his underlying portal hypertension and his psych history --although, to my exam he does not have an acute abdomen.    Therefore, I don't see  anything that warrants more aggressive surgical  care in this man at the moment.  We will keep him on the list and follow with you.    Matt B. Hassell Done, MD, [facility], [place]

## 2019-03-10 NOTE — Progress Notes (Signed)
Daily Progress Note   Patient Name: [name]       Date: 03/10/2019 DOB: 10/10/1960  Age: 58 y.o. MRN#: [id number] Attending Physician: Cristal Ford, DO Primary Care Physician: Nolene Ebbs, MD Admit Date: 02/08/2019  Reason for Consultation/Follow-up: Establishing goals of care and Psychosocial/spiritual support  Chart reviewed.  Discussed with TRH attending MD and PMT attending MD.   Subjective: Visited with patient at bedside.  He tells me "I feel good".  He states he needs his arthritis medicine in the morning and complains of arthritis pain.    I spoke with [name] on the phone.  I explain that mentally he appears good, but with regard to labs and clinical findings he is declining.  I talked with her about the thick yellow liquid drawn out of his stomach that was likely infected and the blood work on his kidneys that show they are worsening.   She is concerned - "What do we do?"  I talked with her about taking this one day at a time.    Assessment: WBC rising, creatinine rising, likely peritonitis.  Rocephin restarted yesterday.  Patient now NPO.  Surgery re-consulted.   Patient Profile/HPI: Palliative Care consult requested for this 58 y.o. 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  CT of abdomen and pelvis revealed pneumoperitoneum from bowel perforation.  Surgery was consulted but patient was not felt to be a surgical candidate due to the high risk from comorbidities.  He has been treated conservatively.    Length of Stay: 30  Current  Medications: Scheduled Meds:  . benztropine mesylate  0.5 mg Intramuscular Daily  . Chlorhexidine Gluconate Cloth  6 each Topical Daily  . feeding supplement (ENSURE ENLIVE)  237 mL Oral TID BM  . haloperidol  2 mg Oral Daily  . hydrocortisone sod succinate (SOLU-CORTEF) inj  25 mg Intravenous Q12H  . midodrine  10 mg Oral TID WC  . multivitamin with minerals  1 tablet Oral Daily  . Muscle Rub   Topical BID  . OLANZapine zydis  5 mg Oral QHS  . pantoprazole  40 mg Oral BID  . sodium chloride flush  10-40 mL Intracatheter Q12H    Continuous Infusions: . sodium chloride    . sodium chloride Stopped (02/12/19 1737)  . sodium chloride Stopped (02/19/19 1422)  . cefTRIAXone (ROCEPHIN)  IV 2 g (03/10/19 0910)    PRN Meds: Place/Maintain arterial line **AND** sodium chloride, sodium chloride, haloperidol lactate, HYDROmorphone (DILAUDID) injection, lidocaine (PF), lidocaine, ondansetron (ZOFRAN) IV, sodium chloride flush, sodium chloride flush, sodium chloride flush  Physical Exam       Pale, awake, alert, orientated, appropriate. CV rrr with 3/6 systolic murmur Resp no distress no w/c/r Abdomen distended but not tight LE 2-3+ edema   Vital Signs: BP 112/60 (BP Location: Left Arm)   Pulse 100   Temp 98 F (36.7 C) (Oral)   Resp 14   Ht 6\' 6"  (1.981 m)   Wt 118.3 kg   SpO2 96%   BMI 30.14 kg/m  SpO2: SpO2: 96 % O2 Device: O2 Device: Room Air O2 Flow Rate:    Intake/output summary:   Intake/Output Summary (Last 24 hours) at 03/10/2019 1207 Last data filed at 03/10/2019 0900 Gross per 24 hour  Intake 570 ml  Output 1945 ml  Net -1375 ml   LBM: Last BM Date: 03/04/19 Baseline Weight: Weight: 99.8 kg Most recent weight: Weight: 118.3 kg       Palliative Assessment/Data: 30%    Flowsheet Rows     Most Recent Value  Intake Tab  Referral Department  Hospitalist  Unit at Time of Referral  ER  Date Notified  02/09/19  Palliative Care Type  New Palliative care    Reason for referral  Clarify Goals of Care  Date of Admission  02/08/19  Date first seen by Palliative Care  02/11/19  # of days Palliative referral response time  2 Day(s)  # of days IP prior to Palliative referral  1  Clinical Assessment  Psychosocial & Spiritual Assessment  Palliative Care Outcomes      Patient Active Problem List   Diagnosis Date Noted  . Personality disorder (HCC)   . Ascites   . Kidney disease   . Pressure injury of skin 03/01/2019  . Advanced care planning/counseling discussion   . Abdominal distention   . Acute renal failure with acute tubular necrosis superimposed on stage 3b chronic kidney disease (HCC)   . Palliative care encounter   . Palliative care by specialist   . AKI (acute kidney injury) (HCC)   . Goals of care, counseling/discussion   . Septic shock (HCC) 02/09/2019  . Pneumoperitoneum   . Central venous catheter in place   . Abdominal pain 02/08/2019  . Bowel perforation (HCC) 02/08/2019  . Bleeding per rectum   . Acute gastric ulcer   . Duodenal ulcer   . Acute esophagitis   . Acute blood loss anemia   . Hematemesis 01/08/2019  . Prolonged QT interval 01/08/2019  . Esophageal varices in cirrhosis (HCC) 01/08/2019  . CKD (chronic kidney disease) stage 3, GFR 30-59 ml/min 01/08/2019  . Suicidal ideation 01/08/2019  . Closed displaced fracture of neck of left fifth metacarpal bone 01/08/2019  . GI bleed 08/07/2018  . Acute abdominal pain   . Cirrhosis (HCC)   . Generalized abdominal pain   . Hematemesis with nausea   . Evaluation by psychiatric service required   . Acute upper gastrointestinal bleeding 10/15/2017  . PTSD (post-traumatic stress disorder)   . Hypothyroidism   . History of adrenal insufficiency   . H/O diabetes insipidus   . Schizoaffective  disorder (HCC) 11/08/2011  . Normocytic anemia   . Liver disease   . Hepatitis C   . GERD (gastroesophageal reflux disease)     Palliative Care Plan     Recommendations/Plan:  In the past patient has been adamant about being a full code.  When code status conversations are held with him he states that his life is not valued because he is schizophrenic and he remains firm that he is a full code.  Unfortunately patient has a poor prognosis.  Due to his underlying physical fragility and current acute co-morbidities I do not believe performing ACLS on him would be of any benefit.  Quite the opposite, I'm concerned it would traumatize him at end of life. I recommend DNR for this patient, but he is not accepting of the recommendation.   Will restart gabapentin for arthritis pain - given renal failure will initiate a very low dose.  PMT will continue to follow intermittently and to update his family friend [place]  Goals of Care and Additional Recommendations:  Limitations on Scope of Treatment: Full Scope Treatment  Code Status:  Full code  Prognosis:   Unable to determine.  Patient is at very high risk for acute decline and death.  His WBC and creatinine are rising, platelets are falling, and he appears to have peritonitis after bowel perforation.  Discharge Planning:  Skilled Nursing Facility for rehab with Palliative care service follow-up   Thank you for allowing the Palliative Medicine Team to assist in the care of this patient.  Total time spent:  45 min.     Greater than 50%  of this time was spent counseling and coordinating care related to the above assessment and plan.  Norvel Richards, PA-C Palliative Medicine  Please contact Palliative MedicineTeam phone at [phone] for questions and concerns between 7 am - 7 pm.   Please see AMION for individual provider pager numbers.

## 2019-03-10 NOTE — Progress Notes (Signed)
PROGRESS NOTE    [name]  SWN:[id number] DOB: 07/17/1960 DOA: 02/08/2019 PCP: Fleet Contras, MD   Brief Narrative:  59 year old with history of cirrhosis with splenomegaly, hepatitis C, esophageal varices, adrenal insufficiency, hypothyroidism, schizoaffective disorder, PTSD, CKD stage IIIa initially brought to the hospital for worsening abdominal pain, distention and rectal bleeding. Work-up showed perforated bowel and pneumoperitoneum. General surgery recommended nonoperative management with IV antibiotics and bowel rest. Due to worsening of the pain, repeat CT of the abdomen pelvis showed free intra-abdominal fluid and pneumoperitoneum. There is also perforated bowel near pyloric or stomach. Underwent UGI on 11/16 showing focal contained perforation in distal stomach. Palliative care team is following. Psychiatry evaluate the patient and stated does not have capacity to make his own decisions. Assessment & Plan   Severe septic shock secondary to perforated bowel/peritoneum and gastric perforation -Upper GI series showed gastric perforation upon admission 02/08/2019 -GI and general surgery consulted and appreciated -Previous hospitalist had discussion on 03/05/2019 with GI, diet was advanced as tolerated- If patient tolerates diet without worsening abdominal pain distention, fever or leukocytosis consider discharge within the next 48 to 72 hours -He has been tolerating his regular diet without issue -Consider repeat imaging after tolerating more advanced diet to ensure no worsening of abdominal pneumoperitoneum low patient mains clinically stable with suspect imaging to be stable as well -Previous hospitalist discussed with infectious disease, recommended to discontinue Zosyn and follow clinically -Patient remains afebrile -Of note he was on TPN which is now been discontinued, on 03/06/2019.  If leukocytosis continues to rise or patient has worsening symptoms or febrile, will obtain  blood cultures given that he is high risk for bloodstream infection given his recent prolonged use of TPN -Unfortunately WBC count has been trending upward for several days.  Pending fluid culture results.  Placed on ceftriaxone empirically. -Obtain CT abdomen on 03/10/2019: Large volume pneumoperitoneum and ascites decreased from prior study. -General surgery reconsulted, no surgical intervention warranted at this time.  Resolving acute metabolic encephalopathy -Suspect multifactorial including sepsis versus uncontrolled underlying psychiatric illness -Continue to reorient -Responding well to Zyprexa and Haldol  Decompensated hepatitis C with liver cirrhosis and ascites -Avoid hepatotoxic agents -Last paracentesis was on 02/25/2019 by infiltrative radiology noting 14 L of clear yellow fluid -S/p paracentesis today, yielding 9L followed by another one on 12/11 yielding 1L of thick turbid beige fluid -Albumin was given on 03/08/2019 -Pending fluid studies -will give another dose of lasix today -Repeat CT as above  Hypervolemic hyponatremia -Currently on fluid restriction, sodium currently 127 -will give another low dose lasix  -Continue to monitor BMP  Hypotension -Likely secondary to decompensated cirrhosis -BP does appear to be stable  -Continue midodrine, solu-cortef  Thrombocytopenia -Likely secondary to decompensated hepatitis C/liver cirrhosis -platelets 71 today -Continue supportive care -continue to monitor   Acute blood loss anemia/gastric and duodenal ulcer -EGD in October 2020 showed grade D esophagitis with nonbleeding gastric ulcer, duodenal ulcer and hiatal hernia -Continue PPI  Hyperglycemia -Hemoglobin A1c was 3.9 02/09/2019 -Hold off of insulin to avoid hypoglycemia  Acute kidney injury on CKD, stage IIIb -Baseline creatinine approximately 1.7-2 -Nephrology consulted and appreciated -Renal ultrasound suggestive of chronic medical renal disease -Continue  to monitor BMP and avoid nephrotoxic agents -creatinine upto 2.40 today -will give another dose of lasix and monitor BMP -monitor intake/output  History of PTSD/schizoaffective disorder -Patient has been seen by psychiatry and deemed not to have capacity to make medical decisions -Family and friends are helping with those  decisions  Physical debility/ambulatory dysfunction -PT recommended -Social work consulted and awaiting placement -Continue fall precautions  Murmur -Discussed with palliative care, states she does not remember murmur during the beginning of his admission.  Will obtain echocardiogram to further examine.  Goals of care -Given degree of liver cirrhosis with decompensation as well as well perforated bowel with pneumoperitoneum, feel that patient is at high risk for decompensation. -Palliative care is consulted and has been following.  DVT Prophylaxis  SCDs  Code Status: Full  Family Communication: None at bedside  Disposition Plan: Admitted. When patient is medically stable, will likely need SNF. Difficult placement.   Consultants Palliative care Interventional radiology Nephrology General surgery Psychiatry  Gastroenterology  Infectious disease via phone by Dr. Avon Gully  Procedures  Ultrasound-guided paracentesis, yielding 14 L clear yellow fluid on 02/25/2019 PICC line placed by IR on 02/21/2019  Antibiotics   Anti-infectives (From admission, onward)   Start     Dose/Rate Route Frequency Ordered Stop   03/09/19 0800  cefTRIAXone (ROCEPHIN) 2 g in sodium chloride 0.9 % 100 mL IVPB     2 g 200 mL/hr over 30 Minutes Intravenous Every 24 hours 03/09/19 0703     02/08/19 2200  piperacillin-tazobactam (ZOSYN) IVPB 3.375 g  Status:  Discontinued     3.375 g 12.5 mL/hr over 240 Minutes Intravenous Every 8 hours 02/08/19 1552 03/06/19 1638   02/08/19 1545  piperacillin-tazobactam (ZOSYN) IVPB 3.375 g     3.375 g 100 mL/hr over 30 Minutes Intravenous  Once  02/08/19 1533 02/08/19 1840      Subjective:   [name] seen and examined today.  Denies current abdominal pain, nausea or vomiting.  Denies current chest pain, shortness of breath, dizziness or headache. Objective:   Vitals:   03/09/19 0439 03/09/19 1359 03/09/19 2216 03/10/19 0439  BP: 133/72 120/66 103/63 112/60  Pulse: 86 94 92 100  Resp: 18 18 14 14   Temp: 97.8 F (36.6 C) 97.9 F (36.6 C) 98.1 F (36.7 C) 98 F (36.7 C)  TempSrc: Oral Oral Oral Oral  SpO2: 100% 95% 99% 96%  Weight:    118.3 kg  Height:        Intake/Output Summary (Last 24 hours) at 03/10/2019 1225 Last data filed at 03/10/2019 0900 Gross per 24 hour  Intake 570 ml  Output 1945 ml  Net -1375 ml   Filed Weights   03/07/19 0500 03/08/19 0500 03/10/19 0439  Weight: 131.5 kg 132 kg 118.3 kg   Exam  General: Well developed, chronically ill-appearing, NAD  HEENT: NCAT, mucous membranes moist.   Cardiovascular: S1 S2 auscultated, RRR, SEM  Respiratory: Clear to auscultation bilaterally  Abdomen: Soft, nontender, nondistended, + bowel sounds  Extremities: warm dry without cyanosis clubbing.  3+ pitting edema lower extremities  Neuro: AAOx3, nonfocal  Psych: Appropriate mood and affect   Data Reviewed: I have personally reviewed following labs and imaging studies  CBC: Recent Labs  Lab 03/05/19 0404 03/08/19 0500 03/09/19 0504 03/10/19 0427  WBC 18.0* 22.3* 15.2* 22.6*  NEUTROABS 14.6*  --   --   --   HGB 9.7* 9.9* 8.9* 9.1*  HCT 30.7* 30.7* 27.8* 27.6*  MCV 97.8 95.0 94.6 93.2  PLT 88* 114* 66* 71*   Basic Metabolic Panel: Recent Labs  Lab 03/05/19 0404 03/08/19 0500 03/09/19 0504 03/10/19 0427  NA 130* 124* 125* 127*  K 4.2 5.1 5.1 5.2*  CL 97* 93* 94* 96*  CO2 25 22 23 23   GLUCOSE  128* 95 85 105*  BUN 90* 99* 104* 109*  CREATININE 1.97* 2.26* 2.46* 2.40*  CALCIUM 10.5* 11.1* 11.3* 11.2*  MG 2.0  --   --   --   PHOS 4.4  --   --   --    GFR: Estimated  Creatinine Clearance: 48.5 mL/min (A) (by C-G formula based on SCr of 2.4 mg/dL (H)). Liver Function Tests: Recent Labs  Lab 03/05/19 0404  AST 35  ALT 28  ALKPHOS 110  BILITOT 1.1  PROT 5.8*  ALBUMIN 1.5*   No results for input(s): LIPASE, AMYLASE in the last 168 hours. No results for input(s): AMMONIA in the last 168 hours. Coagulation Profile: No results for input(s): INR, PROTIME in the last 168 hours. Cardiac Enzymes: No results for input(s): CKTOTAL, CKMB, CKMBINDEX, TROPONINI in the last 168 hours. BNP (last 3 results) No results for input(s): PROBNP in the last 8760 hours. HbA1C: No results for input(s): HGBA1C in the last 72 hours. CBG: Recent Labs  Lab 03/09/19 1216 03/09/19 1656 03/09/19 2211 03/10/19 0756 03/10/19 1220  GLUCAP 84 87 110* 93 109*   Lipid Profile: No results for input(s): CHOL, HDL, LDLCALC, TRIG, CHOLHDL, LDLDIRECT in the last 72 hours. Thyroid Function Tests: No results for input(s): TSH, T4TOTAL, FREET4, T3FREE, THYROIDAB in the last 72 hours. Anemia Panel: No results for input(s): VITAMINB12, FOLATE, FERRITIN, TIBC, IRON, RETICCTPCT in the last 72 hours. Urine analysis:    Component Value Date/Time   COLORURINE YELLOW 02/18/2019 1839   APPEARANCEUR HAZY (A) 02/18/2019 1839   LABSPEC 1.019 02/18/2019 1839   PHURINE 5.0 02/18/2019 1839   GLUCOSEU NEGATIVE 02/18/2019 1839   HGBUR NEGATIVE 02/18/2019 1839   BILIRUBINUR NEGATIVE 02/18/2019 1839   KETONESUR NEGATIVE 02/18/2019 1839   PROTEINUR NEGATIVE 02/18/2019 1839   UROBILINOGEN 1.0 01/19/2013 0015   NITRITE NEGATIVE 02/18/2019 1839   LEUKOCYTESUR NEGATIVE 02/18/2019 1839   Sepsis Labs: @LABRCNTIP (procalcitonin:4,lacticidven:4)  ) Recent Results (from the past 240 hour(s))  Body fluid culture     Status: None (Preliminary result)   Collection Time: 03/09/19  2:45 PM   Specimen: Abdomen; Peritoneal Fluid  Result Value Ref Range Status   Specimen Description PERITONEAL  Final     Special Requests NONE  Final   Gram Stain   Final    ABUNDANT WBC PRESENT,BOTH PMN AND MONONUCLEAR NO ORGANISMS SEEN    Culture   Final    NO GROWTH < 24 HOURS Performed at [facility], [address] [place], [place] [address]    Report Status PENDING  Incomplete      Radiology Studies: CT ABDOMEN PELVIS WO CONTRAST  Result Date: 03/10/2019 CLINICAL DATA:  Sepsis.  Generalized abdominal pain. EXAM: CT ABDOMEN AND PELVIS WITHOUT CONTRAST TECHNIQUE: Multidetector CT imaging of the abdomen and pelvis was performed following the standard protocol without IV contrast. COMPARISON:  March 02, 2019 FINDINGS: Lower chest: There are bilateral pleural effusions that are only partially visualized. There is atelectasis at the lung bases. The heart size appears to be enlarged. Hepatobiliary: The liver is cirrhotic. Normal gallbladder.There is no biliary ductal dilation. Pancreas: Normal contours without ductal dilatation. No peripancreatic fluid collection. Spleen: The spleen is enlarged. Adrenals/Urinary Tract: --Adrenal glands: No adrenal hemorrhage. --Right kidney/ureter: No hydronephrosis or perinephric hematoma. --Left kidney/ureter: No hydronephrosis or perinephric hematoma. --Urinary bladder: Unremarkable. Stomach/Bowel: --Stomach/Duodenum: Oral contrast is noted within the stomach. --Small bowel: No dilatation or inflammation. --Colon: No focal abnormality. --Appendix: The appendix is not clearly identified.  Vascular/Lymphatic: Atherosclerotic calcification is present within the non-aneurysmal abdominal aorta, without hemodynamically significant stenosis. Portosystemic shunting is noted. --No retroperitoneal lymphadenopathy. --No mesenteric lymphadenopathy. --No pelvic or inguinal lymphadenopathy. Reproductive: Unremarkable Other: Extensive pneumoperitoneum is again noted, decreased in volume from the prior study. There is a large volume of abdominal ascites, decreased from prior study.  There is new thickening of the peritoneum. There is diffuse mesenteric edema. There is anasarca. Musculoskeletal. There is an old healed fracture deformity of the right proximal hip with subluxation of the femoral head. There is no acute displaced fracture. IMPRESSION: 1. Large volume pneumoperitoneum, decreased from prior study. The exact site of bowel perforation is not identified on this exam. 2. Large volume abdominal free fluid, decreased from prior study. 3. Probable peritonitis, although not well evaluated in the absence of IV contrast. 4. Anasarca. 5. Bilateral pleural effusions. 6. Cirrhosis with stigmata of portal hypertension. Electronically Signed   By: Katherine Mantlehristopher  Green M.D.   On: 03/10/2019 00:38   IR Paracentesis  Result Date: 03/09/2019 INDICATION: History of cirrhosis with recurrent ascites. None contain gastric perforation with pneumoperitoneum. Leukocytosis. Request repeat diagnostic and therapeutic paracentesis. EXAM: ULTRASOUND GUIDED LEFT LOWER QUADRANT PARACENTESIS MEDICATIONS: None. COMPLICATIONS: None immediate. PROCEDURE: Informed written consent was obtained from the patient after a discussion of the risks, benefits and alternatives to treatment. A timeout was performed prior to the initiation of the procedure. Initial ultrasound scanning demonstrates a moderate amount of ascites within the left lower abdominal quadrant. Fluid appears more complex with floating debris on today's exam. The left lower abdomen was prepped and draped in the usual sterile fashion. 1% lidocaine was used for local anesthesia. Following this, a 19 gauge, 7-cm, Yueh catheter was introduced. An ultrasound image was saved for documentation purposes. The paracentesis was performed. The catheter was removed and a dressing was applied. The patient tolerated the procedure well without immediate post procedural complication. FINDINGS: A total of approximately 1 L of thick, turbid, beige/purulent fluid was removed.  Samples were sent to the laboratory as requested by the clinical team. IMPRESSION: Successful ultrasound-guided paracentesis yielding 1 liters of peritoneal fluid. Findings were relayed to the ordering physician. Read by: Brayton ElKevin Bruning PA-C Electronically Signed   By: Gilmer MorJaime  Wagner D.O.   On: 03/09/2019 15:41     Scheduled Meds: . benztropine mesylate  0.5 mg Intramuscular Daily  . Chlorhexidine Gluconate Cloth  6 each Topical Daily  . feeding supplement (ENSURE ENLIVE)  237 mL Oral TID BM  . haloperidol  2 mg Oral Daily  . hydrocortisone sod succinate (SOLU-CORTEF) inj  25 mg Intravenous Q12H  . midodrine  10 mg Oral TID WC  . multivitamin with minerals  1 tablet Oral Daily  . Muscle Rub   Topical BID  . OLANZapine zydis  5 mg Oral QHS  . pantoprazole  40 mg Oral BID  . sodium chloride flush  10-40 mL Intracatheter Q12H   Continuous Infusions: . sodium chloride    . sodium chloride Stopped (02/12/19 1737)  . sodium chloride Stopped (02/19/19 1422)  . cefTRIAXone (ROCEPHIN)  IV 2 g (03/10/19 0910)     LOS: 30 days   Time Spent in minutes   45 minutes  Kenyetta Wimbish D.O. on 03/10/2019 at 12:25 PM  Between 7am to 7pm - Please see pager noted on [email or web address]  After 7pm go to [email or web address]  And look for the night coverage person covering for me after hours  [facility] Office  [phone]

## 2019-03-11 ENCOUNTER — Inpatient Hospital Stay (HOSPITAL_COMMUNITY): Payer: Medicaid Other

## 2019-03-11 DIAGNOSIS — R011 Cardiac murmur, unspecified: Secondary | ICD-10-CM

## 2019-03-11 LAB — BASIC METABOLIC PANEL
Anion gap: 11 (ref 5–15)
BUN: 108 mg/dL — ABNORMAL HIGH (ref 6–20)
CO2: 21 mmol/L — ABNORMAL LOW (ref 22–32)
Calcium: 11.3 mg/dL — ABNORMAL HIGH (ref 8.9–10.3)
Chloride: 95 mmol/L — ABNORMAL LOW (ref 98–111)
Creatinine, Ser: 2.22 mg/dL — ABNORMAL HIGH (ref 0.61–1.24)
GFR calc Af Amer: 37 mL/min — ABNORMAL LOW (ref >60)
GFR calc non Af Amer: 31 mL/min — ABNORMAL LOW (ref >60)
Glucose, Bld: 98 mg/dL (ref 70–99)
Potassium: 4.9 mmol/L (ref 3.5–5.1)
Sodium: 127 mmol/L — ABNORMAL LOW (ref 135–145)

## 2019-03-11 LAB — CBC
HCT: 27 % — ABNORMAL LOW (ref 39.0–52.0)
Hemoglobin: 8.7 g/dL — ABNORMAL LOW (ref 13.0–17.0)
MCH: 30 pg (ref 26.0–34.0)
MCHC: 32.2 g/dL (ref 30.0–36.0)
MCV: 93.1 fL (ref 80.0–100.0)
Platelets: 70 10*3/uL — ABNORMAL LOW (ref 150–400)
RBC: 2.9 MIL/uL — ABNORMAL LOW (ref 4.22–5.81)
RDW: 18.6 % — ABNORMAL HIGH (ref 11.5–15.5)
WBC: 20.7 10*3/uL — ABNORMAL HIGH (ref 4.0–10.5)
nRBC: 0 % (ref 0.0–0.2)

## 2019-03-11 LAB — GLUCOSE, CAPILLARY
Glucose-Capillary: 121 mg/dL — ABNORMAL HIGH (ref 70–99)
Glucose-Capillary: 91 mg/dL (ref 70–99)
Glucose-Capillary: 102 mg/dL — ABNORMAL HIGH (ref 70–99)
Glucose-Capillary: 96 mg/dL (ref 70–99)

## 2019-03-11 LAB — ECHOCARDIOGRAM COMPLETE
Height: 78 in
Weight: 4172.87 oz

## 2019-03-11 NOTE — Progress Notes (Signed)
  Echocardiogram 2D Echocardiogram has been performed.  [name] 03/11/2019, 1:08 PM

## 2019-03-11 NOTE — Progress Notes (Signed)
PROGRESS NOTE    [name]  PVV:[id number] DOB: [birth year] DOA: 02/08/2019 PCP: Fleet Contras, MD   Brief Narrative:  58 year old with history of cirrhosis with splenomegaly, hepatitis C, esophageal varices, adrenal insufficiency, hypothyroidism, schizoaffective disorder, PTSD, CKD stage IIIa initially brought to the hospital for worsening abdominal pain, distention and rectal bleeding. Work-up showed perforated bowel and pneumoperitoneum. General surgery recommended nonoperative management with IV antibiotics and bowel rest. Due to worsening of the pain, repeat CT of the abdomen pelvis showed free intra-abdominal fluid and pneumoperitoneum. There is also perforated bowel near pyloric or stomach. Underwent UGI on 11/16 showing focal contained perforation in distal stomach. Palliative care team is following. Psychiatry evaluate the patient and stated does not have capacity to make his own decisions. Assessment & Plan   Severe septic shock secondary to perforated bowel/peritoneum and gastric perforation -Upper GI series showed gastric perforation upon admission 02/08/2019 -GI and general surgery consulted and appreciated -Previous hospitalist had discussion on 03/05/2019 with GI, diet was advanced as tolerated- If patient tolerates diet without worsening abdominal pain distention, fever or leukocytosis consider discharge within the next 48 to 72 hours -He has been tolerating his regular diet without issue -Consider repeat imaging after tolerating more advanced diet to ensure no worsening of abdominal pneumoperitoneum low patient mains clinically stable with suspect imaging to be stable as well -Previous hospitalist discussed with infectious disease, recommended to discontinue Zosyn and follow clinically -Patient remains afebrile -Of note he was on TPN which is now been discontinued, on 03/06/2019.  If leukocytosis continues to rise or patient has worsening symptoms or febrile, will obtain  blood cultures given that he is high risk for bloodstream infection given his recent prolonged use of TPN -Unfortunately WBC count has been trending upward for several days.  Pending fluid culture results.  Placed on ceftriaxone empirically. -Obtain CT abdomen on 03/10/2019: Large volume pneumoperitoneum and ascites decreased from prior study. -General surgery reconsulted, no surgical intervention warranted at this time.  Resolving acute metabolic encephalopathy -Suspect multifactorial including sepsis versus uncontrolled underlying psychiatric illness -Continue to reorient -Responding well to Zyprexa and Haldol  Decompensated hepatitis C with liver cirrhosis and ascites -Avoid hepatotoxic agents -Last paracentesis was on 02/25/2019 by infiltrative radiology noting 14 L of clear yellow fluid -S/p paracentesis today, yielding 9L followed by another one on 12/11 yielding 1L of thick turbid beige fluid -Albumin was given on 03/08/2019 -Pending fluid studies -continue low dose lasix  -Repeat CT as above  Hypervolemic hyponatremia -Currently on fluid restriction, sodium currently 127 -Continue low dose lasix -Continue to monitor BMP  Hypotension -Likely secondary to decompensated cirrhosis -BP does appear to be stable  -Continue midodrine, solu-cortef  Thrombocytopenia -Likely secondary to decompensated hepatitis C/liver cirrhosis -platelets 71 today -Continue supportive care -continue to monitor   Acute blood loss anemia/gastric and duodenal ulcer -EGD in October 2020 showed grade D esophagitis with nonbleeding gastric ulcer, duodenal ulcer and hiatal hernia -Continue PPI  Hyperglycemia -Hemoglobin A1c was 3.9 02/09/2019 -Hold off of insulin to avoid hypoglycemia  Acute kidney injury on CKD, stage IIIb -Baseline creatinine approximately 1.7-2 -Nephrology consulted and appreciated -Renal ultrasound suggestive of chronic medical renal disease -Continue to monitor BMP and  avoid nephrotoxic agents -creatinine improving, down to 2.22 today (had worsened to 2.46) -monitor intake/output -continue to monitor BMP  History of PTSD/schizoaffective disorder -Patient has been seen by psychiatry and deemed not to have capacity to make medical decisions -Family and friends are helping with those decisions  Physical debility/ambulatory  dysfunction -PT recommended -Social work consulted and awaiting placement -Continue fall precautions  Murmur -Discussed with palliative care, states she does not remember murmur during the beginning of his admission.   -Pending echocardiogram  Goals of care -Given degree of liver cirrhosis with decompensation as well as well perforated bowel with pneumoperitoneum, feel that patient is at high risk for decompensation. -Palliative care is consulted and has been following.  DVT Prophylaxis  SCDs  Code Status: Full  Family Communication: None at bedside  Disposition Plan: Admitted. When patient is medically stable, will likely need SNF. Difficult placement.   Consultants Palliative care Interventional radiology Nephrology General surgery Psychiatry  Gastroenterology  Infectious disease via phone by Dr. Natale Milch  Procedures  Ultrasound-guided paracentesis, yielding 14 L clear yellow fluid on 02/25/2019 PICC line placed by IR on 02/21/2019 Echocardiogram  Antibiotics   Anti-infectives (From admission, onward)   Start     Dose/Rate Route Frequency Ordered Stop   03/09/19 0800  cefTRIAXone (ROCEPHIN) 2 g in sodium chloride 0.9 % 100 mL IVPB     2 g 200 mL/hr over 30 Minutes Intravenous Every 24 hours 03/09/19 0703     02/08/19 2200  piperacillin-tazobactam (ZOSYN) IVPB 3.375 g  Status:  Discontinued     3.375 g 12.5 mL/hr over 240 Minutes Intravenous Every 8 hours 02/08/19 1552 03/06/19 1638   02/08/19 1545  piperacillin-tazobactam (ZOSYN) IVPB 3.375 g     3.375 g 100 mL/hr over 30 Minutes Intravenous  Once 02/08/19  1533 02/08/19 1840      Subjective:   [name] seen and examined today.  Denies current abdominal pain, nausea or vomiting.  Denies chest pain, shortness of breath, dizziness or headache.  Continues to ask for some type of a medication which I am unable to understand. Objective:   Vitals:   03/10/19 0439 03/10/19 1601 03/10/19 2128 03/11/19 0548  BP: 112/60 107/62 (!) 103/56 111/62  Pulse: 100 (!) 101 (!) 105 94  Resp: 14 18 18 18   Temp: 98 F (36.7 C) 98.3 F (36.8 C) 98.7 F (37.1 C)   TempSrc: Oral Oral Oral   SpO2: 96% 95% 95% 98%  Weight: 118.3 kg     Height:        Intake/Output Summary (Last 24 hours) at 03/11/2019 1308 Last data filed at 03/10/2019 1730 Gross per 24 hour  Intake 240 ml  Output 75 ml  Net 165 ml   Filed Weights   03/07/19 0500 03/08/19 0500 03/10/19 0439  Weight: 131.5 kg 132 kg 118.3 kg   Exam  General: Well developed, chronically ill-appearing, NAD  HEENT: NCAT, mucous membranes moist.   Cardiovascular: S1 S2 auscultated,, RRR, SEM  Respiratory: Clear to auscultation bilaterally with equal chest rise  Abdomen: Soft, nontender, nondistended, + bowel sounds  Extremities: warm dry without cyanosis clubbing or edema  Neuro: AAOx3, 3+ pitting edema lower extremities bilaterally  Psych: Appropriate mood and affect  Data Reviewed: I have personally reviewed following labs and imaging studies  CBC: Recent Labs  Lab 03/05/19 0404 03/08/19 0500 03/09/19 0504 03/10/19 0427 03/11/19 0356  WBC 18.0* 22.3* 15.2* 22.6* 20.7*  NEUTROABS 14.6*  --   --   --   --   HGB 9.7* 9.9* 8.9* 9.1* 8.7*  HCT 30.7* 30.7* 27.8* 27.6* 27.0*  MCV 97.8 95.0 94.6 93.2 93.1  PLT 88* 114* 66* 71* 70*   Basic Metabolic Panel: Recent Labs  Lab 03/05/19 0404 03/08/19 0500 03/09/19 0504 03/10/19 0427 03/11/19 0356  NA 130* 124* 125* 127* 127*  K 4.2 5.1 5.1 5.2* 4.9  CL 97* 93* 94* 96* 95*  CO2 25 22 23 23  21*  GLUCOSE 128* 95 85 105* 98  BUN  90* 99* 104* 109* 108*  CREATININE 1.97* 2.26* 2.46* 2.40* 2.22*  CALCIUM 10.5* 11.1* 11.3* 11.2* 11.3*  MG 2.0  --   --   --   --   PHOS 4.4  --   --   --   --    GFR: Estimated Creatinine Clearance: 52.4 mL/min (A) (by C-G formula based on SCr of 2.22 mg/dL (H)). Liver Function Tests: Recent Labs  Lab 03/05/19 0404  AST 35  ALT 28  ALKPHOS 110  BILITOT 1.1  PROT 5.8*  ALBUMIN 1.5*   No results for input(s): LIPASE, AMYLASE in the last 168 hours. No results for input(s): AMMONIA in the last 168 hours. Coagulation Profile: No results for input(s): INR, PROTIME in the last 168 hours. Cardiac Enzymes: No results for input(s): CKTOTAL, CKMB, CKMBINDEX, TROPONINI in the last 168 hours. BNP (last 3 results) No results for input(s): PROBNP in the last 8760 hours. HbA1C: No results for input(s): HGBA1C in the last 72 hours. CBG: Recent Labs  Lab 03/10/19 1220 03/10/19 1637 03/10/19 2132 03/11/19 0815 03/11/19 1235  GLUCAP 109* 113* 100* 121* 91   Lipid Profile: No results for input(s): CHOL, HDL, LDLCALC, TRIG, CHOLHDL, LDLDIRECT in the last 72 hours. Thyroid Function Tests: No results for input(s): TSH, T4TOTAL, FREET4, T3FREE, THYROIDAB in the last 72 hours. Anemia Panel: No results for input(s): VITAMINB12, FOLATE, FERRITIN, TIBC, IRON, RETICCTPCT in the last 72 hours. Urine analysis:    Component Value Date/Time   COLORURINE YELLOW 02/18/2019 1839   APPEARANCEUR HAZY (A) 02/18/2019 1839   LABSPEC 1.019 02/18/2019 1839   PHURINE 5.0 02/18/2019 1839   GLUCOSEU NEGATIVE 02/18/2019 1839   HGBUR NEGATIVE 02/18/2019 1839   BILIRUBINUR NEGATIVE 02/18/2019 1839   KETONESUR NEGATIVE 02/18/2019 1839   PROTEINUR NEGATIVE 02/18/2019 1839   UROBILINOGEN 1.0 01/19/2013 0015   NITRITE NEGATIVE 02/18/2019 1839   LEUKOCYTESUR NEGATIVE 02/18/2019 1839   Sepsis Labs: @LABRCNTIP (procalcitonin:4,lacticidven:4)  ) Recent Results (from the past 240 hour(s))  Body fluid culture      Status: None (Preliminary result)   Collection Time: 03/09/19  2:45 PM   Specimen: Abdomen; Peritoneal Fluid  Result Value Ref Range Status   Specimen Description PERITONEAL  Final   Special Requests NONE  Final   Gram Stain   Final    ABUNDANT WBC PRESENT,BOTH PMN AND MONONUCLEAR NO ORGANISMS SEEN    Culture   Final    FEW YEAST IDENTIFICATION TO FOLLOW CRITICAL RESULT CALLED TO, READ BACK BY AND VERIFIED WITH: RN N HARVY [id number] AT 856 AM BY CM Performed at [facility], [address] [place], [place] [address]    Report Status PENDING  Incomplete      Radiology Studies: CT ABDOMEN PELVIS WO CONTRAST  Result Date: 03/10/2019 CLINICAL DATA:  Sepsis.  Generalized abdominal pain. EXAM: CT ABDOMEN AND PELVIS WITHOUT CONTRAST TECHNIQUE: Multidetector CT imaging of the abdomen and pelvis was performed following the standard protocol without IV contrast. COMPARISON:  March 02, 2019 FINDINGS: Lower chest: There are bilateral pleural effusions that are only partially visualized. There is atelectasis at the lung bases. The heart size appears to be enlarged. Hepatobiliary: The liver is cirrhotic. Normal gallbladder.There is no biliary ductal dilation. Pancreas: Normal contours without ductal dilatation. No peripancreatic  fluid collection. Spleen: The spleen is enlarged. Adrenals/Urinary Tract: --Adrenal glands: No adrenal hemorrhage. --Right kidney/ureter: No hydronephrosis or perinephric hematoma. --Left kidney/ureter: No hydronephrosis or perinephric hematoma. --Urinary bladder: Unremarkable. Stomach/Bowel: --Stomach/Duodenum: Oral contrast is noted within the stomach. --Small bowel: No dilatation or inflammation. --Colon: No focal abnormality. --Appendix: The appendix is not clearly identified. Vascular/Lymphatic: Atherosclerotic calcification is present within the non-aneurysmal abdominal aorta, without hemodynamically significant stenosis. Portosystemic shunting is noted. --No  retroperitoneal lymphadenopathy. --No mesenteric lymphadenopathy. --No pelvic or inguinal lymphadenopathy. Reproductive: Unremarkable Other: Extensive pneumoperitoneum is again noted, decreased in volume from the prior study. There is a large volume of abdominal ascites, decreased from prior study. There is new thickening of the peritoneum. There is diffuse mesenteric edema. There is anasarca. Musculoskeletal. There is an old healed fracture deformity of the right proximal hip with subluxation of the femoral head. There is no acute displaced fracture. IMPRESSION: 1. Large volume pneumoperitoneum, decreased from prior study. The exact site of bowel perforation is not identified on this exam. 2. Large volume abdominal free fluid, decreased from prior study. 3. Probable peritonitis, although not well evaluated in the absence of IV contrast. 4. Anasarca. 5. Bilateral pleural effusions. 6. Cirrhosis with stigmata of portal hypertension. Electronically Signed   By: Katherine Mantlehristopher  Green M.D.   On: 03/10/2019 00:38   IR Paracentesis  Result Date: 03/09/2019 INDICATION: History of cirrhosis with recurrent ascites. None contain gastric perforation with pneumoperitoneum. Leukocytosis. Request repeat diagnostic and therapeutic paracentesis. EXAM: ULTRASOUND GUIDED LEFT LOWER QUADRANT PARACENTESIS MEDICATIONS: None. COMPLICATIONS: None immediate. PROCEDURE: Informed written consent was obtained from the patient after a discussion of the risks, benefits and alternatives to treatment. A timeout was performed prior to the initiation of the procedure. Initial ultrasound scanning demonstrates a moderate amount of ascites within the left lower abdominal quadrant. Fluid appears more complex with floating debris on today's exam. The left lower abdomen was prepped and draped in the usual sterile fashion. 1% lidocaine was used for local anesthesia. Following this, a 19 gauge, 7-cm, Yueh catheter was introduced. An ultrasound image was  saved for documentation purposes. The paracentesis was performed. The catheter was removed and a dressing was applied. The patient tolerated the procedure well without immediate post procedural complication. FINDINGS: A total of approximately 1 L of thick, turbid, beige/purulent fluid was removed. Samples were sent to the laboratory as requested by the clinical team. IMPRESSION: Successful ultrasound-guided paracentesis yielding 1 liters of peritoneal fluid. Findings were relayed to the ordering physician. Read by: Brayton ElKevin Bruning PA-C Electronically Signed   By: Gilmer MorJaime  Wagner D.O.   On: 03/09/2019 15:41     Scheduled Meds: . benztropine mesylate  0.5 mg Intramuscular Daily  . Chlorhexidine Gluconate Cloth  6 each Topical Daily  . feeding supplement (ENSURE ENLIVE)  237 mL Oral TID BM  . gabapentin  100 mg Oral QHS  . haloperidol  2 mg Oral Daily  . hydrocortisone sod succinate (SOLU-CORTEF) inj  25 mg Intravenous Q12H  . midodrine  10 mg Oral TID WC  . multivitamin with minerals  1 tablet Oral Daily  . Muscle Rub   Topical BID  . OLANZapine zydis  5 mg Oral QHS  . pantoprazole  40 mg Oral BID  . sodium chloride flush  10-40 mL Intracatheter Q12H   Continuous Infusions: . sodium chloride    . sodium chloride Stopped (02/12/19 1737)  . sodium chloride Stopped (02/19/19 1422)  . cefTRIAXone (ROCEPHIN)  IV 2 g (03/11/19 0844)  LOS: 31 days   Time Spent in minutes   30 minutes  Junah Yam D.O. on 03/11/2019 at 1:08 PM  Between 7am to 7pm - Please see pager noted on [email or web address]  After 7pm go to [email or web address]  And look for the night coverage person covering for me after hours  [facility] Office  [phone]

## 2019-03-11 NOTE — Progress Notes (Signed)
Patient ID: [name], male   DOB: [birth year], 58 y.o.   MRN: [id number] [facility] Progress Note:   * No surgery found *  Subjective: Mental status is about the same.  Verbalizes but not appearing competent. Objective: Vital signs in last 24 hours: Temp:  [98.3 F (36.8 C)-98.7 F (37.1 C)] 98.7 F (37.1 C) (12/12 2128) Pulse Rate:  [94-105] 94 (12/13 0548) Resp:  [18] 18 (12/13 0548) BP: (103-111)/(56-62) 111/62 (12/13 0548) SpO2:  [95 %-98 %] 98 % (12/13 0548)  Intake/Output from previous day: 12/12 0701 - 12/13 0700 In: 800 [P.O.:800] Out: 325 [Urine:325] Intake/Output this shift: No intake/output data recorded.  Physical Exam: Work of breathing is not labored;  Mask down.  Abdomen is nontender  Lab Results:  Results for orders placed or performed during the hospital encounter of 02/08/19 (from the past 48 hour(s))  Glucose, capillary     Status: None   Collection Time: 03/09/19 12:16 PM  Result Value Ref Range   Glucose-Capillary 84 70 - 99 mg/dL  Body fluid cell count with differential     Status: Abnormal   Collection Time: 03/09/19  2:45 PM  Result Value Ref Range   Fluid Type-FCT ABD PERITO     Comment: CORRECTED ON 12/11 AT 1455: PREVIOUSLY REPORTED AS ABDOMEN   Color, Fluid YELLOW YELLOW   Appearance, Fluid TURBID (A) CLEAR   Total Nucleated Cell Count, Fluid 92,400 (H) 0 - 1,000 cu mm   Neutrophil Count, Fluid 95 (H) 0 - 25 %   Lymphs, Fluid 3 %   Monocyte-Macrophage-Serous Fluid 2 (L) 50 - 90 %   Eos, Fluid NONE SEEN %   Other Cells, Fluid COUNT MAY BE INACCURATE DUE TO FIBRIN CLUMPS. %    Comment: Performed at [facility], [address], [place], [place] [address]  Body fluid culture     Status: None (Preliminary result)   Collection Time: 03/09/19  2:45 PM   Specimen: Abdomen; Peritoneal Fluid  Result Value Ref Range   Specimen Description PERITONEAL    Special Requests NONE    Gram Stain      ABUNDANT WBC PRESENT,BOTH PMN AND  MONONUCLEAR NO ORGANISMS SEEN    Culture      FEW YEAST IDENTIFICATION TO FOLLOW CRITICAL RESULT CALLED TO, READ BACK BY AND VERIFIED WITH: RN N HARVY [id number] AT 856 AM BY CM Performed at [facility], [place] [address] [place], [place] [address]    Report Status PENDING   Glucose, pleural or peritoneal fluid     Status: None   Collection Time: 03/09/19  2:45 PM  Result Value Ref Range   Glucose, Fluid <20 mg/dL    Comment: RESULT CALLED TO, READ BACK BY AND VERIFIED WITH: Buelah Manis RN AT 1520 ON [id number] BY K FORSYTH    Fluid Type-FGLU ABD PERITO     Comment: Performed at [facility], [place] [address] [place], [place] [address] CORRECTED ON 12/11 AT 0093: PREVIOUSLY REPORTED AS ABDOMEN   Glucose, capillary     Status: None   Collection Time: 03/09/19  4:56 PM  Result Value Ref Range   Glucose-Capillary 87 70 - 99 mg/dL  Glucose, capillary     Status: Abnormal   Collection Time: 03/09/19 10:11 PM  Result Value Ref Range   Glucose-Capillary 110 (H) 70 - 99 mg/dL  Basic metabolic panel     Status: Abnormal   Collection Time: 03/10/19  4:27 AM  Result Value Ref Range   Sodium 127 (L) 135 - 145 mmol/L   Potassium 5.2 (H) 3.5 - 5.1 mmol/L   Chloride 96 (L) 98 - 111 mmol/L   CO2 23 22 - 32 mmol/L   Glucose, Bld 105 (H) 70 - 99 mg/dL   BUN 621109 (H) 6 - 20 mg/dL   Creatinine, Ser 3.082.40 (H) 0.61 - 1.24 mg/dL   Calcium 65.711.2 (H) 8.9 - 10.3 mg/dL   GFR calc non Af Amer 29 (L) >60 mL/min   GFR calc Af Amer 33 (L) >60 mL/min   Anion gap 8 5 - 15    Comment: Performed at [facility], [address] [place], [place] [address]  [facility]     Status: Abnormal   Collection Time: 03/10/19  4:27 AM  Result Value Ref Range   WBC 22.6 (H) 4.0 - 10.5 K/uL   RBC 2.96 (L) 4.22 - 5.81 MIL/uL   Hemoglobin 9.1 (L) 13.0 - 17.0 g/dL   HCT 29.527.6 (L) 28.439.0 - 13.252.0 %   MCV 93.2 80.0 - 100.0 fL   MCH 30.7 26.0 - 34.0 pg   MCHC 33.0 30.0 - 36.0 g/dL   RDW 44.018.6 (H) 10.211.5 - 72.515.5 %   Platelets  71 (L) 150 - 400 K/uL    Comment: REPEATED TO VERIFY SPECIMEN CHECKED FOR CLOTS Immature Platelet Fraction may be clinically indicated, consider ordering this additional test [id number] CONSISTENT WITH PREVIOUS RESULT    nRBC 0.0 0.0 - 0.2 %    Comment: Performed at [facility], [address] [place], [place] [address]  Glucose, capillary     Status: None   Collection Time: 03/10/19  7:56 AM  Result Value Ref Range   Glucose-Capillary 93 70 - 99 mg/dL  Glucose, capillary     Status: Abnormal   Collection Time: 03/10/19 12:20 PM  Result Value Ref Range   Glucose-Capillary 109 (H) 70 - 99 mg/dL  Glucose, capillary     Status: Abnormal   Collection Time: 03/10/19  4:37 PM  Result Value Ref Range   Glucose-Capillary 113 (H) 70 - 99 mg/dL  Glucose, capillary     Status: Abnormal   Collection Time: 03/10/19  9:32 PM  Result Value Ref Range   Glucose-Capillary 100 (H) 70 - 99 mg/dL  Basic metabolic panel     Status: Abnormal   Collection Time: 03/11/19  3:56 AM  Result Value Ref Range   Sodium 127 (L) 135 - 145 mmol/L   Potassium 4.9 3.5 - 5.1 mmol/L   Chloride 95 (L) 98 - 111 mmol/L   CO2 21 (L) 22 - 32 mmol/L   Glucose, Bld 98 70 - 99 mg/dL   BUN 563108 (H) 6 - 20 mg/dL   Creatinine, Ser 8.752.22 (H) 0.61 - 1.24 mg/dL   Calcium 64.311.3 (H) 8.9 - 10.3 mg/dL   GFR calc non Af Amer 31 (L) >60 mL/min   GFR calc Af Amer 37 (L) >60 mL/min   Anion gap 11 5 - 15    Comment: Performed at [facility], [address] [place], [place] [address]  [facility]     Status: Abnormal   Collection Time: 03/11/19  3:56 AM  Result Value Ref Range   WBC 20.7 (H) 4.0 - 10.5 K/uL   RBC 2.90 (L) 4.22 - 5.81 MIL/uL   Hemoglobin 8.7 (L) 13.0 - 17.0 g/dL   HCT 88.427.0 (L) 16.639.0 - 06.352.0 %   MCV  93.1 80.0 - 100.0 fL   MCH 30.0 26.0 - 34.0 pg   MCHC 32.2 30.0 - 36.0 g/dL   RDW 31.4 (H) 97.0 - 26.3 %   Platelets 70 (L) 150 - 400 K/uL    Comment: REPEATED TO VERIFY Immature Platelet Fraction may  be clinically indicated, consider ordering this additional test [id number] CONSISTENT WITH PREVIOUS RESULT    nRBC 0.0 0.0 - 0.2 %    Comment: Performed at [facility], [address], [place], [place] [address]  Glucose, capillary     Status: Abnormal   Collection Time: 03/11/19  8:15 AM  Result Value Ref Range   Glucose-Capillary 121 (H) 70 - 99 mg/dL    Radiology/Results: CT ABDOMEN PELVIS WO CONTRAST  Result Date: 03/10/2019 CLINICAL DATA:  Sepsis.  Generalized abdominal pain. EXAM: CT ABDOMEN AND PELVIS WITHOUT CONTRAST TECHNIQUE: Multidetector CT imaging of the abdomen and pelvis was performed following the standard protocol without IV contrast. COMPARISON:  March 02, 2019 FINDINGS: Lower chest: There are bilateral pleural effusions that are only partially visualized. There is atelectasis at the lung bases. The heart size appears to be enlarged. Hepatobiliary: The liver is cirrhotic. Normal gallbladder.There is no biliary ductal dilation. Pancreas: Normal contours without ductal dilatation. No peripancreatic fluid collection. Spleen: The spleen is enlarged. Adrenals/Urinary Tract: --Adrenal glands: No adrenal hemorrhage. --Right kidney/ureter: No hydronephrosis or perinephric hematoma. --Left kidney/ureter: No hydronephrosis or perinephric hematoma. --Urinary bladder: Unremarkable. Stomach/Bowel: --Stomach/Duodenum: Oral contrast is noted within the stomach. --Small bowel: No dilatation or inflammation. --Colon: No focal abnormality. --Appendix: The appendix is not clearly identified. Vascular/Lymphatic: Atherosclerotic calcification is present within the non-aneurysmal abdominal aorta, without hemodynamically significant stenosis. Portosystemic shunting is noted. --No retroperitoneal lymphadenopathy. --No mesenteric lymphadenopathy. --No pelvic or inguinal lymphadenopathy. Reproductive: Unremarkable Other: Extensive pneumoperitoneum is again noted, decreased in volume from the prior  study. There is a large volume of abdominal ascites, decreased from prior study. There is new thickening of the peritoneum. There is diffuse mesenteric edema. There is anasarca. Musculoskeletal. There is an old healed fracture deformity of the right proximal hip with subluxation of the femoral head. There is no acute displaced fracture. IMPRESSION: 1. Large volume pneumoperitoneum, decreased from prior study. The exact site of bowel perforation is not identified on this exam. 2. Large volume abdominal free fluid, decreased from prior study. 3. Probable peritonitis, although not well evaluated in the absence of IV contrast. 4. Anasarca. 5. Bilateral pleural effusions. 6. Cirrhosis with stigmata of portal hypertension. Electronically Signed   By: Katherine Mantle M.D.   On: 03/10/2019 00:38   IR Paracentesis  Result Date: 03/09/2019 INDICATION: History of cirrhosis with recurrent ascites. None contain gastric perforation with pneumoperitoneum. Leukocytosis. Request repeat diagnostic and therapeutic paracentesis. EXAM: ULTRASOUND GUIDED LEFT LOWER QUADRANT PARACENTESIS MEDICATIONS: None. COMPLICATIONS: None immediate. PROCEDURE: Informed written consent was obtained from the patient after a discussion of the risks, benefits and alternatives to treatment. A timeout was performed prior to the initiation of the procedure. Initial ultrasound scanning demonstrates a moderate amount of ascites within the left lower abdominal quadrant. Fluid appears more complex with floating debris on today's exam. The left lower abdomen was prepped and draped in the usual sterile fashion. 1% lidocaine was used for local anesthesia. Following this, a 19 gauge, 7-cm, Yueh catheter was introduced. An ultrasound image was saved for documentation purposes. The paracentesis was performed. The catheter was removed and a dressing was applied. The patient tolerated the procedure well without immediate post  procedural complication. FINDINGS: A  total of approximately 1 L of thick, turbid, beige/purulent fluid was removed. Samples were sent to the laboratory as requested by the clinical team. IMPRESSION: Successful ultrasound-guided paracentesis yielding 1 liters of peritoneal fluid. Findings were relayed to the ordering physician. Read by: Brayton El PA-C Electronically Signed   By: Gilmer Mor D.O.   On: 03/09/2019 15:41    Anti-infectives: Anti-infectives (From admission, onward)   Start     Dose/Rate Route Frequency Ordered Stop   03/09/19 0800  cefTRIAXone (ROCEPHIN) 2 g in sodium chloride 0.9 % 100 mL IVPB     2 g 200 mL/hr over 30 Minutes Intravenous Every 24 hours 03/09/19 0703     02/08/19 2200  piperacillin-tazobactam (ZOSYN) IVPB 3.375 g  Status:  Discontinued     3.375 g 12.5 mL/hr over 240 Minutes Intravenous Every 8 hours 02/08/19 1552 03/06/19 1638   02/08/19 1545  piperacillin-tazobactam (ZOSYN) IVPB 3.375 g     3.375 g 100 mL/hr over 30 Minutes Intravenous  Once 02/08/19 1533 02/08/19 1840      Assessment/Plan: Problem List: Patient Active Problem List   Diagnosis Date Noted  . Rheumatoid arthritis involving multiple sites (HCC)   . Pressure injury of skin 03/01/2019  . Advanced care planning/counseling discussion   . Abdominal distention   . Acute renal failure with acute tubular necrosis superimposed on stage 3b chronic kidney disease (HCC)   . Palliative care encounter   . Palliative care by specialist   . AKI (acute kidney injury) (HCC)   . Goals of care, counseling/discussion   . Septic shock (HCC) 02/09/2019  . Pneumoperitoneum   . Central venous catheter in place   . Abdominal pain 02/08/2019  . Bowel perforation (HCC) 02/08/2019  . Bleeding per rectum   . Acute gastric ulcer   . Duodenal ulcer   . Acute esophagitis   . Acute blood loss anemia   . Hematemesis 01/08/2019  . Prolonged QT interval 01/08/2019  . Esophageal varices in cirrhosis (HCC) 01/08/2019  . CKD (chronic kidney  disease) stage 3, GFR 30-59 ml/min 01/08/2019  . Suicidal ideation 01/08/2019  . Closed displaced fracture of neck of left fifth metacarpal bone 01/08/2019  . GI bleed 08/07/2018  . Acute abdominal pain   . Cirrhosis (HCC)   . Generalized abdominal pain   . Hematemesis with nausea   . Evaluation by psychiatric service required   . Acute upper gastrointestinal bleeding 10/15/2017  . PTSD (post-traumatic stress disorder)   . Hypothyroidism   . History of adrenal insufficiency   . H/O diabetes insipidus   . Schizoaffective disorder (HCC) 11/08/2011  . Personality disorder (HCC)   . Normocytic anemia   . Ascites   . Liver disease   . Hepatitis C   . Kidney disease   . GERD (gastroesophageal reflux disease)     Might consider peritoneal drain that is left in to manage ascites- peritonitis.   * No surgery found *    LOS: 31 days   Matt B. Daphine Deutscher, MD, [facility] [phone] beeper [phone]  03/11/2019 9:12 AM

## 2019-03-12 LAB — PATHOLOGIST SMEAR REVIEW

## 2019-03-12 LAB — BASIC METABOLIC PANEL
Anion gap: 9 (ref 5–15)
BUN: 107 mg/dL — ABNORMAL HIGH (ref 6–20)
CO2: 22 mmol/L (ref 22–32)
Calcium: 11.4 mg/dL — ABNORMAL HIGH (ref 8.9–10.3)
Chloride: 95 mmol/L — ABNORMAL LOW (ref 98–111)
Creatinine, Ser: 2.24 mg/dL — ABNORMAL HIGH (ref 0.61–1.24)
GFR calc Af Amer: 36 mL/min — ABNORMAL LOW (ref >60)
GFR calc non Af Amer: 31 mL/min — ABNORMAL LOW (ref >60)
Glucose, Bld: 99 mg/dL (ref 70–99)
Potassium: 5.1 mmol/L (ref 3.5–5.1)
Sodium: 126 mmol/L — ABNORMAL LOW (ref 135–145)

## 2019-03-12 LAB — GLUCOSE, CAPILLARY
Glucose-Capillary: 108 mg/dL — ABNORMAL HIGH (ref 70–99)
Glucose-Capillary: 98 mg/dL (ref 70–99)
Glucose-Capillary: 113 mg/dL — ABNORMAL HIGH (ref 70–99)
Glucose-Capillary: 96 mg/dL (ref 70–99)

## 2019-03-12 LAB — CBC
HCT: 27.9 % — ABNORMAL LOW (ref 39.0–52.0)
Hemoglobin: 8.8 g/dL — ABNORMAL LOW (ref 13.0–17.0)
MCH: 30.1 pg (ref 26.0–34.0)
MCHC: 31.5 g/dL (ref 30.0–36.0)
MCV: 95.5 fL (ref 80.0–100.0)
Platelets: 78 10*3/uL — ABNORMAL LOW (ref 150–400)
RBC: 2.92 MIL/uL — ABNORMAL LOW (ref 4.22–5.81)
RDW: 18.6 % — ABNORMAL HIGH (ref 11.5–15.5)
WBC: 23.7 10*3/uL — ABNORMAL HIGH (ref 4.0–10.5)
nRBC: 0 % (ref 0.0–0.2)

## 2019-03-12 LAB — FUNGUS CULTURE RESULT

## 2019-03-12 LAB — FUNGUS CULTURE WITH STAIN

## 2019-03-12 LAB — BODY FLUID CULTURE

## 2019-03-12 LAB — FUNGAL ORGANISM REFLEX

## 2019-03-12 MED ORDER — FLUCONAZOLE 100 MG PO TABS
400.0000 mg | ORAL_TABLET | Freq: Every day | ORAL | Status: DC
  Administered 2019-03-12 – 2019-03-26 (×14): 400 mg via ORAL
  Filled 2019-03-12 (×14): qty 4

## 2019-03-12 NOTE — Progress Notes (Signed)
Pt has moderate amount of serous fluid coming out of left abdomen 2 tiny holes.  This is keeping his skin wet.  Pouched it with ostomy pouch to keep dry.  Pt has MASD buttocks, will order mattress replacement and Interdry Ag for redness left groin and folds.

## 2019-03-12 NOTE — Progress Notes (Signed)
      Subjective: Patient lying in bed.  He denies any abdominal pain.  960 p.o. recorded.  Objective: Vital signs in last 24 hours: Temp:  [97.5 F (36.4 C)-98.6 F (37 C)] 98.6 F (37 C) (12/14 0607) Pulse Rate:  [90-95] 91 (12/14 0607) Resp:  [18] 18 (12/13 1500) BP: (114-118)/(68-70) 114/68 (12/14 0607) SpO2:  [96 %-99 %] 96 % (12/14 0607) Last BM Date: 03/12/19 960 p.o. Voided x3 BM x6 Afebrile vital signs are stable WBC 23.7 H/H 8.8/27.9 Platelets 78,000 CMP is pending INR (02/15/2019) 1.4 CT scan 03/09/2019: Large volume pneumoperitoneum, decreased from prior studies.  Large volume of free abdominal fluid decreased from prior studies.  Anasarca, bilateral pleural effusions, cirrhosis with stigmata of portal hypertension. Intake/Output from previous day: 12/13 0701 - 12/14 0700 In: 960 [P.O.:960] Out: -  Intake/Output this shift: No intake/output data recorded.  General appearance: alert, cooperative, no distress and Very difficult to understand him. GI: soft, non-tender; bowel sounds normal; no masses,  no organomegaly and Large abdomen he has a washcloth on the right side of his abdomen and a dressing over was probably paracentesis site on the left.  He is not the least bit tender.  Lab Results:  Recent Labs    03/10/19 0427 03/11/19 0356  WBC 22.6* 20.7*  HGB 9.1* 8.7*  HCT 27.6* 27.0*  PLT 71* 70*    BMET Recent Labs    03/10/19 0427 03/11/19 0356  NA 127* 127*  K 5.2* 4.9  CL 96* 95*  CO2 23 21*  GLUCOSE 105* 98  BUN 109* 108*  CREATININE 2.40* 2.22*  CALCIUM 11.2* 11.3*   PT/INR No results for input(s): LABPROT, INR in the last 72 hours.  No results for input(s): AST, ALT, ALKPHOS, BILITOT, PROT, ALBUMIN in the last 168 hours.   Lipase     Component Value Date/Time   LIPASE 26 12/14/2018 0125     Medications: . benztropine mesylate  0.5 mg Intramuscular Daily  . Chlorhexidine Gluconate Cloth  6 each Topical Daily  . feeding  supplement (ENSURE ENLIVE)  237 mL Oral TID BM  . gabapentin  100 mg Oral QHS  . haloperidol  2 mg Oral Daily  . hydrocortisone sod succinate (SOLU-CORTEF) inj  25 mg Intravenous Q12H  . midodrine  10 mg Oral TID WC  . multivitamin with minerals  1 tablet Oral Daily  . Muscle Rub   Topical BID  . OLANZapine zydis  5 mg Oral QHS  . pantoprazole  40 mg Oral BID  . sodium chloride flush  10-40 mL Intracatheter Q12H   . sodium chloride    . sodium chloride Stopped (02/12/19 1737)  . sodium chloride Stopped (02/19/19 1422)  . 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  Continue medical management.    LOS: 32 days    [name] 03/12/2019 Please see Amion

## 2019-03-12 NOTE — Progress Notes (Signed)
PT Cancellation Note  Patient Details Name: [name] MRN: [id number] DOB: September 22, 1960   Cancelled Treatment:    Reason Eval/Treat Not Completed: Patient declined, no reason specified   Pt initially agreeing to sit EOB , but then refused to let tech assist PT.  Pt is max of 2 for transfers, so unable to assist without tech.  Then attempted exercises, pt stated agreeable but would not assist.  He states "leg is broken, I'm broken everywhere."  Tried to encourage on importance of activity to improve strength, but continue to refuse to participate.  Will f/u as able.  Maggie Font, PT Acute Rehab Services Pager [phone] [facility] [place] 03/12/2019, 4:27 PM

## 2019-03-12 NOTE — Progress Notes (Signed)
PROGRESS NOTE    Panfilo Ketchum  OFB:[id number] DOB: Apr 01, 1960 DOA: 02/08/2019 PCP: Nolene Ebbs, MD   Brief Narrative:  58 year old with history of cirrhosis with splenomegaly, hepatitis C, esophageal varices, adrenal insufficiency, hypothyroidism, schizoaffective disorder, PTSD, CKD stage IIIa initially brought to the hospital for worsening abdominal pain, distention and rectal bleeding. Work-up showed perforated bowel and pneumoperitoneum. General surgery recommended nonoperative management with IV antibiotics and bowel rest. Due to worsening of the pain, repeat CT of the abdomen pelvis showed free intra-abdominal fluid and pneumoperitoneum. There is also perforated bowel near pyloric or stomach. Underwent UGI on 11/16 showing focal contained perforation in distal stomach. Palliative care team is following. Psychiatry evaluate the patient and stated does not have capacity to make his own decisions. Assessment & Plan   Severe septic shock secondary to perforated bowel/peritoneum and gastric perforation/ SBP -Upper GI series showed gastric perforation upon admission 02/08/2019 -GI and general surgery consulted and appreciated -Previous hospitalist had discussion on 03/05/2019 with GI, diet was advanced as tolerated- If patient tolerates diet without worsening abdominal pain distention, fever or leukocytosis consider discharge within the next 48 to 72 hours -He has been tolerating his regular diet without issue -Consider repeat imaging after tolerating more advanced diet to ensure no worsening of abdominal pneumoperitoneum low patient mains clinically stable with suspect imaging to be stable as well -Previous hospitalist discussed with infectious disease, recommended to discontinue Zosyn and follow clinically -Patient remains afebrile -Of note he was on TPN which is now been discontinued, on 03/06/2019.  If leukocytosis continues to rise or patient has worsening symptoms or febrile, will obtain  blood cultures given that he is high risk for bloodstream infection given his recent prolonged use of TPN -Unfortunately WBC count has been trending upward for several days.  Fluid culture results show few candida albicans.  Placed on ceftriaxone empirically. Consider addition of diflucan -Obtain CT abdomen on 03/10/2019: Large volume pneumoperitoneum and ascites decreased from prior study. -General surgery reconsulted, no surgical intervention warranted at this time.  Resolving acute metabolic encephalopathy -Suspect multifactorial including sepsis versus uncontrolled underlying psychiatric illness -Continue to reorient -Not entirely sure if patient truly understands and has insight into current health situation -Responding well to Zyprexa and Haldol  Decompensated hepatitis C with liver cirrhosis and ascites -Avoid hepatotoxic agents -Last paracentesis was on 02/25/2019 by infiltrative radiology noting 14 L of clear yellow fluid -S/p paracentesis today, yielding 9L followed by another one on 12/11 yielding 1L of thick turbid beige fluid -Albumin was given on 03/08/2019 -Fluid culture as above shows a few Candida albicans -continue low dose lasix  -Repeat CT as above  Hypervolemic hyponatremia -Currently on fluid restriction, sodium currently 126 -Continue low dose lasix -Continue to monitor BMP  Hypotension -Likely secondary to decompensated cirrhosis -BP does appear to be stable  -Continue midodrine, solu-cortef  Thrombocytopenia -Likely secondary to decompensated hepatitis C/liver cirrhosis -platelets 78 today -Continue supportive care -continue to monitor   Acute blood loss anemia/gastric and duodenal ulcer -EGD in October 2020 showed grade D esophagitis with nonbleeding gastric ulcer, duodenal ulcer and hiatal hernia -Continue PPI  Hyperglycemia -Hemoglobin A1c was 3.9 02/09/2019 -Hold off of insulin to avoid hypoglycemia  Acute kidney injury on CKD, stage  IIIb -Baseline creatinine approximately 1.7-2 -Nephrology consulted and appreciated -Renal ultrasound suggestive of chronic medical renal disease -Continue to monitor BMP and avoid nephrotoxic agents -creatinine improving, down to 2.24 today (had worsened to 2.46) -monitor intake/output -continue to monitor BMP  History of PTSD/schizoaffective  disorder -Patient has been seen by psychiatry and deemed not to have capacity to make medical decisions -Family and friends are helping with those decisions  Physical debility/ambulatory dysfunction -PT recommended -Social work consulted and awaiting placement -Continue fall precautions  Murmur -Discussed with palliative care, states she does not remember murmur during the beginning of his admission.   -Pending echocardiogram  Goals of care -Given degree of liver cirrhosis with decompensation as well as well perforated bowel with pneumoperitoneum, feel that patient is at high risk for decompensation. -Palliative care is consulted and has been following.  DVT Prophylaxis  SCDs  Code Status: Full  Family Communication: None at bedside  Disposition Plan: Admitted. When patient is medically stable, will likely need SNF. Difficult placement.   Consultants Palliative care Interventional radiology Nephrology General surgery Psychiatry  Gastroenterology  Infectious disease via phone by Dr. Natale Milch  Procedures  Ultrasound-guided paracentesis, yielding 14 L clear yellow fluid on 02/25/2019 PICC line placed by IR on 02/21/2019 Echocardiogram  Antibiotics   Anti-infectives (From admission, onward)   Start     Dose/Rate Route Frequency Ordered Stop   03/09/19 0800  cefTRIAXone (ROCEPHIN) 2 g in sodium chloride 0.9 % 100 mL IVPB     2 g 200 mL/hr over 30 Minutes Intravenous Every 24 hours 03/09/19 0703     02/08/19 2200  piperacillin-tazobactam (ZOSYN) IVPB 3.375 g  Status:  Discontinued     3.375 g 12.5 mL/hr over 240 Minutes  Intravenous Every 8 hours 02/08/19 1552 03/06/19 1638   02/08/19 1545  piperacillin-tazobactam (ZOSYN) IVPB 3.375 g     3.375 g 100 mL/hr over 30 Minutes Intravenous  Once 02/08/19 1533 02/08/19 1840      Subjective:   [name] seen and examined today.  Denies current abdominal pain, nausea, vomiting, chest pain, shortness of breath, dizziness, headache.  Objective:   Vitals:   03/11/19 0548 03/11/19 1500 03/11/19 2153 03/12/19 0607  BP: 111/62 117/70 118/68 114/68  Hull: 94 95 90 91  Resp: 18 18    Temp:  97.7 F (36.5 C) (!) 97.5 F (36.4 C) 98.6 F (37 C)  TempSrc:  Oral Oral   SpO2: 98% 98% 99% 96%  Weight:      Height:        Intake/Output Summary (Last 24 hours) at 03/12/2019 1248 Last data filed at 03/12/2019 0700 Gross per 24 hour  Intake 600 ml  Output --  Net 600 ml   Filed Weights   03/07/19 0500 03/08/19 0500 03/10/19 0439  Weight: 131.5 kg 132 kg 118.3 kg   Exam  General: Well developed, chronically ill-appearing, NAD  HEENT: NCAT, mucous membranes moist.   Cardiovascular: S1 S2 auscultated, RRR, SEM  Respiratory: Clear to auscultation bilaterally  Abdomen: Soft, nontender, nondistended, + bowel sounds  Extremities: warm dry without cyanosis clubbing or edema  Neuro: AAOx3, 3+ pitting edema lower extremities bilaterally, otherwise nonfocal  Psych: Appropriate mood and affect.  Question his insight into medical problems.   Data Reviewed: I have personally reviewed following labs and imaging studies  CBC: Recent Labs  Lab 03/08/19 0500 03/09/19 0504 03/10/19 0427 03/11/19 0356 03/12/19 0828  WBC 22.3* 15.2* 22.6* 20.7* 23.7*  HGB 9.9* 8.9* 9.1* 8.7* 8.8*  HCT 30.7* 27.8* 27.6* 27.0* 27.9*  MCV 95.0 94.6 93.2 93.1 95.5  PLT 114* 66* 71* 70* 78*   Basic Metabolic Panel: Recent Labs  Lab 03/08/19 0500 03/09/19 0504 03/10/19 0427 03/11/19 0356 03/12/19 0828  NA 124* 125* 127*  127* 126*  K 5.1 5.1 5.2* 4.9 5.1  CL 93* 94*  96* 95* 95*  CO2 22 23 23  21* 22  GLUCOSE 95 85 105* 98 99  BUN 99* 104* 109* 108* 107*  CREATININE 2.26* 2.46* 2.40* 2.22* 2.24*  CALCIUM 11.1* 11.3* 11.2* 11.3* 11.4*   GFR: Estimated Creatinine Clearance: 52 mL/min (A) (by C-G formula based on SCr of 2.24 mg/dL (H)). Liver Function Tests: No results for input(s): AST, ALT, ALKPHOS, BILITOT, PROT, ALBUMIN in the last 168 hours. No results for input(s): LIPASE, AMYLASE in the last 168 hours. No results for input(s): AMMONIA in the last 168 hours. Coagulation Profile: No results for input(s): INR, PROTIME in the last 168 hours. Cardiac Enzymes: No results for input(s): CKTOTAL, CKMB, CKMBINDEX, TROPONINI in the last 168 hours. BNP (last 3 results) No results for input(s): PROBNP in the last 8760 hours. HbA1C: No results for input(s): HGBA1C in the last 72 hours. CBG: Recent Labs  Lab 03/11/19 1235 03/11/19 1733 03/11/19 2214 03/12/19 0809 03/12/19 1210  GLUCAP 91 102* 96 108* 98   Lipid Profile: No results for input(s): CHOL, HDL, LDLCALC, TRIG, CHOLHDL, LDLDIRECT in the last 72 hours. Thyroid Function Tests: No results for input(s): TSH, T4TOTAL, FREET4, T3FREE, THYROIDAB in the last 72 hours. Anemia Panel: No results for input(s): VITAMINB12, FOLATE, FERRITIN, TIBC, IRON, RETICCTPCT in the last 72 hours. Urine analysis:    Component Value Date/Time   COLORURINE YELLOW 02/18/2019 1839   APPEARANCEUR HAZY (A) 02/18/2019 1839   LABSPEC 1.019 02/18/2019 1839   PHURINE 5.0 02/18/2019 1839   GLUCOSEU NEGATIVE 02/18/2019 1839   HGBUR NEGATIVE 02/18/2019 1839   BILIRUBINUR NEGATIVE 02/18/2019 1839   KETONESUR NEGATIVE 02/18/2019 1839   PROTEINUR NEGATIVE 02/18/2019 1839   UROBILINOGEN 1.0 01/19/2013 0015   NITRITE NEGATIVE 02/18/2019 1839   LEUKOCYTESUR NEGATIVE 02/18/2019 1839   Sepsis Labs: @LABRCNTIP (procalcitonin:4,lacticidven:4)  ) Recent Results (from the past 240 hour(s))  Body fluid culture     Status: None    Collection Time: 03/09/19  2:45 PM   Specimen: Abdomen; Peritoneal Fluid  Result Value Ref Range Status   Specimen Description PERITONEAL  Final   Special Requests NONE  Final   Gram Stain   Final    ABUNDANT WBC PRESENT,BOTH PMN AND MONONUCLEAR NO ORGANISMS SEEN    Culture   Final    FEW [facility]ANDIDA [facility]BIC[facility] CRITICAL RESULT CALLED TO, READ BACK BY AND VERIFIED WITH: RN N HARVY [id number] AT 856 AM BY CM Performed at [facility], [address] [place], [place] [address]    Report Status 03/12/2019 FINAL  Final      Radiology Studies: ECHOCARDIOGRAM COMPLETE  Result Date: 03/11/2019   ECHOCARDIOGRAM REPORT   Patient Name:   Benjamin PulseDNAN Jordan Date of Exam: 03/11/2019 Medical Rec #:  [id number]   Height:       78.0 in Accession #:    [id number]  Weight:       260.8 lb Date of Birth:  09/14/1960    BSA:          2.53 m Patient Age:    58 years    BP:           111/62 mmHg Patient Gender: M           HR:           93 bpm. Exam Location:  Inpatient Procedure: 2D Echo Indications:    Murmur 785.2/R01.1  History:  Patient has prior history of Echocardiogram examinations, most                 recent 01/30/2010. Risk Factors:Current Smoker. CKD.  Sonographer:    Ross LudwigArthur Guy RDCS (AE) Referring Phys: [id number] [facility] Clennon [facility]  Sonographer Comments: Suboptimal parasternal window and suboptimal subcostal window. Parasteral long and short axis images suboptimal due to chest wall deformity. IMPRESSIONS  1. Left ventricular ejection fraction, by visual estimation, is 70 to 75%. The left ventricle has hyperdynamic function. There is mildly increased left ventricular hypertrophy.  2. Left ventricular diastolic parameters are consistent with Grade I diastolic dysfunction (impaired relaxation).  3. The left ventricle has no regional wall motion abnormalities.  4. There is SAM of the anterior mitral valve leaflet in the setting of moderate basal septal hypertrophy and a hyperdynamic LV creating a dynamic LVOT  gradient. Difficult Doppler assessment of gradient to seperate from MR signal. The peak gradient appears to be 65 mmHg.  5. Global right ventricle has normal systolic function.The right ventricular size is normal. No increase in right ventricular wall thickness.  6. Left atrial size was mild-moderately dilated.  7. Right atrial size was mildly dilated.  8. The mitral valve is normal in structure. Mild mitral valve regurgitation. No evidence of mitral stenosis.  9. The tricuspid valve is normal in structure. Tricuspid valve regurgitation is not demonstrated. 10. The aortic valve was not well visualized. Aortic valve regurgitation is not visualized. Diffilcult Doppler assessment of AV in setting of dynamic LVOT gradient. Structurally the valve appears normal, no significant stenosis. The elevated gradient appears to be subvalvular. 11. The pulmonic valve was not well visualized. Pulmonic valve regurgitation is not visualized. FINDINGS  Left Ventricle: Left ventricular ejection fraction, by visual estimation, is 70 to 75%. The left ventricle has hyperdynamic function. The left ventricle has no regional wall motion abnormalities. There is mildly increased left ventricular hypertrophy. Left ventricular diastolic parameters are consistent with Grade I diastolic dysfunction (impaired relaxation). Normal left atrial pressure. There is SAM of the anterior mitral valve leaflet in the setting of moderate basal septal hypertrophy and a hyperdynamic LV creating a dynamic LVOT gradient. Difficult Doppler assessment of gradient to seperate from MR signal. The peak gradient appears to be 65 mmHg. Right Ventricle: The right ventricular size is normal. No increase in right ventricular wall thickness. Global RV systolic function is has normal systolic function. Left Atrium: Left atrial size was mild-moderately dilated. Right Atrium: Right atrial size was mildly dilated Pericardium: There is no evidence of pericardial effusion. Mitral  Valve: The mitral valve is normal in structure. Mild mitral valve regurgitation. No evidence of mitral valve stenosis by observation. MV peak gradient, 9.5 mmHg. Tricuspid Valve: The tricuspid valve is normal in structure. Tricuspid valve regurgitation is not demonstrated. Aortic Valve: The aortic valve was not well visualized. Aortic valve regurgitation is not visualized. Diffilcult Doppler assessment of AV in setting of dynamic LVOT gradient. Structurally the valve appears normal, no significant stenosis. The elevated gradient appears to be subvalvular. Aortic valve mean gradient measures 32.0 mmHg. Aortic valve peak gradient measures 67.4 mmHg. Pulmonic Valve: The pulmonic valve was not well visualized. Pulmonic valve regurgitation is not visualized. Pulmonic regurgitation is not visualized. Aorta: The aortic root is normal in size and structure. Pulmonary Artery: Indeterminant PASP, inadequate TR jet. IAS/Shunts: No atrial level shunt detected by color flow Doppler.  LEFT VENTRICLE PLAX 2D LVIDd:         3.70 cm  Diastology LVIDs:  2.30 cm  LV e' lateral:   10.10 cm/s LV PW:         1.10 cm  LV E/e' lateral: 10.9 LV IVS:        1.10 cm  LV e' medial:    7.83 cm/s LVOT diam:     2.00 cm  LV E/e' medial:  14.0 LV SV:         40 ml LV SV Index:   15.55 LVOT Area:     3.14 cm  RIGHT VENTRICLE RV Basal diam:  2.80 cm RV S prime:     22.90 cm/s TAPSE (M-mode): 2.9 cm LEFT ATRIUM              Index       RIGHT ATRIUM           Index LA diam:        2.60 cm  1.03 cm/m  RA Area:     21.50 cm LA Vol (A2C):   131.0 ml 51.84 ml/m RA Volume:   60.90 ml  24.10 ml/m LA Vol (A4C):   76.6 ml  30.31 ml/m LA Biplane Vol: 100.0 ml 39.57 ml/m  AORTIC VALVE AV Vmax:      410.40 cm/s AV Vmean:     259.800 cm/s AV VTI:       0.743 m AV Peak Grad: 67.4 mmHg AV Mean Grad: 32.0 mmHg  AORTA Ao Root diam: 3.10 cm MITRAL VALVE MV Area (PHT): 1.77 cm              SHUNTS MV Peak grad:  9.5 mmHg              Systemic Diam: 2.00  cm MV Mean grad:  4.0 mmHg MV Vmax:       1.54 m/s MV Vmean:      97.2 cm/s MV VTI:        0.36 m MV PHT:        124.12 msec MV Decel Time: 428 msec MV E velocity: 110.00 cm/s 103 cm/s MV A velocity: 123.00 cm/s 70.3 cm/s MV E/A ratio:  0.89        1.5  Dina Rich MD Electronically signed by Dina Rich MD Signature Date/Time: 03/11/2019/1:58:02 PM    Final      Scheduled Meds: . benztropine mesylate  0.5 mg Intramuscular Daily  . Chlorhexidine Gluconate Cloth  6 each Topical Daily  . feeding supplement (ENSURE ENLIVE)  237 mL Oral TID BM  . gabapentin  100 mg Oral QHS  . haloperidol  2 mg Oral Daily  . hydrocortisone sod succinate (SOLU-CORTEF) inj  25 mg Intravenous Q12H  . midodrine  10 mg Oral TID WC  . multivitamin with minerals  1 tablet Oral Daily  . Muscle Rub   Topical BID  . OLANZapine zydis  5 mg Oral QHS  . pantoprazole  40 mg Oral BID  . sodium chloride flush  10-40 mL Intracatheter Q12H   Continuous Infusions: . sodium chloride    . sodium chloride Stopped (02/12/19 1737)  . sodium chloride Stopped (02/19/19 1422)  . cefTRIAXone (ROCEPHIN)  IV 2 g (03/12/19 0802)     LOS: 32 days   Time Spent in minutes   30 minutes  Tammye Kahler D.O. on 03/12/2019 at 12:48 PM  Between 7am to 7pm - Please see pager noted on [email or web address]  After 7pm go to [email or web address]  And look for the night coverage person covering for me after hours  [place]  [phone]

## 2019-03-13 ENCOUNTER — Inpatient Hospital Stay (HOSPITAL_COMMUNITY): Payer: Medicaid Other

## 2019-03-13 LAB — CBC
HCT: 28.3 % — ABNORMAL LOW (ref 39.0–52.0)
Hemoglobin: 9.1 g/dL — ABNORMAL LOW (ref 13.0–17.0)
MCH: 30.4 pg (ref 26.0–34.0)
MCHC: 32.2 g/dL (ref 30.0–36.0)
MCV: 94.6 fL (ref 80.0–100.0)
Platelets: 106 10*3/uL — ABNORMAL LOW (ref 150–400)
RBC: 2.99 MIL/uL — ABNORMAL LOW (ref 4.22–5.81)
RDW: 18.6 % — ABNORMAL HIGH (ref 11.5–15.5)
WBC: 29.1 10*3/uL — ABNORMAL HIGH (ref 4.0–10.5)
nRBC: 0 % (ref 0.0–0.2)

## 2019-03-13 LAB — GLUCOSE, CAPILLARY
Glucose-Capillary: 70 mg/dL (ref 70–99)
Glucose-Capillary: 73 mg/dL (ref 70–99)
Glucose-Capillary: 81 mg/dL (ref 70–99)
Glucose-Capillary: 82 mg/dL (ref 70–99)
Glucose-Capillary: 118 mg/dL — ABNORMAL HIGH (ref 70–99)

## 2019-03-13 LAB — BASIC METABOLIC PANEL
Anion gap: 11 (ref 5–15)
BUN: 109 mg/dL — ABNORMAL HIGH (ref 6–20)
CO2: 20 mmol/L — ABNORMAL LOW (ref 22–32)
Calcium: 11.4 mg/dL — ABNORMAL HIGH (ref 8.9–10.3)
Chloride: 95 mmol/L — ABNORMAL LOW (ref 98–111)
Creatinine, Ser: 2.29 mg/dL — ABNORMAL HIGH (ref 0.61–1.24)
GFR calc Af Amer: 35 mL/min — ABNORMAL LOW (ref >60)
GFR calc non Af Amer: 30 mL/min — ABNORMAL LOW (ref >60)
Glucose, Bld: 68 mg/dL — ABNORMAL LOW (ref 70–99)
Potassium: 5 mmol/L (ref 3.5–5.1)
Sodium: 126 mmol/L — ABNORMAL LOW (ref 135–145)

## 2019-03-13 MED ORDER — FUROSEMIDE 10 MG/ML IJ SOLN
40.0000 mg | Freq: Once | INTRAMUSCULAR | Status: AC
  Administered 2019-03-13: 40 mg via INTRAVENOUS
  Filled 2019-03-13: qty 4

## 2019-03-13 NOTE — Progress Notes (Signed)
PROGRESS NOTE    Osten Janek  PTW:[id number] DOB: [birth year] DOA: 02/08/2019 PCP: Fleet Contras, MD   Brief Narrative:  58 year old with history of cirrhosis with splenomegaly, hepatitis C, esophageal varices, adrenal insufficiency, hypothyroidism, schizoaffective disorder, PTSD, CKD stage IIIa initially brought to the hospital for worsening abdominal pain, distention and rectal bleeding. Work-up showed perforated bowel and pneumoperitoneum. General surgery recommended nonoperative management with IV antibiotics and bowel rest. Due to worsening of the pain, repeat CT of the abdomen pelvis showed free intra-abdominal fluid and pneumoperitoneum. There is also perforated bowel near pyloric or stomach. Underwent UGI on 11/16 showing focal contained perforation in distal stomach. Palliative care team is following. Psychiatry evaluate the patient and stated does not have capacity to make his own decisions.  Patient with suspected SBP, on ceftriaxone.  Fluid culture showed Candida, started on fluconazole.  Patient still with worsening leukocytosis.  Now today complaining of abdominal pain.  Pending repeat ultrasound. Assessment & Plan   Severe septic shock secondary to perforated bowel/peritoneum and gastric perforation/ SBP -Upper GI series showed gastric perforation upon admission 02/08/2019 -GI and general surgery consulted and appreciated -Previous hospitalist had discussion on 03/05/2019 with GI, diet was advanced as tolerated- If patient tolerates diet without worsening abdominal pain distention, fever or leukocytosis consider discharge within the next 48 to 72 hours -Consider repeat imaging after tolerating more advanced diet to ensure no worsening of abdominal pneumoperitoneum low patient mains clinically stable with suspect imaging to be stable as well -Previous hospitalist discussed with infectious disease, recommended to discontinue Zosyn and follow clinically -Patient remains afebrile  -Of note he was on TPN which is now been discontinued, on 03/06/2019.  If leukocytosis continues to rise or patient has worsening symptoms or febrile, will obtain blood cultures given that he is high risk for bloodstream infection given his recent prolonged use of TPN -Unfortunately WBC count has been trending upward for several days.  Fluid culture results show few candida albicans.  Placed on ceftriaxone empirically and have started fluconazole. -Obtain CT abdomen on 03/10/2019: Large volume pneumoperitoneum and ascites decreased from prior study. -General surgery reconsulted, no surgical intervention warranted at this time. -Patient has been tolerating his diet however this morning he has now started complaining of abdominal pain -Leukocytosis continues to rise, currently up to 29.1 -Have ordered repeat ultrasound -Will make patient n.p.o.  Resolving acute metabolic encephalopathy -Suspect multifactorial including sepsis versus uncontrolled underlying psychiatric illness -Continue to reorient -Not entirely sure if patient truly understands and has insight into current health situation -Responding well to Zyprexa and Haldol  Decompensated hepatitis C with liver cirrhosis and ascites -Avoid hepatotoxic agents -Last paracentesis was on 02/25/2019 by infiltrative radiology noting 14 L of clear yellow fluid -S/p paracentesis today, yielding 9L followed by another one on 12/11 yielding 1L of thick turbid beige fluid -Albumin was given on 03/08/2019 -Fluid culture as above shows a few Candida albicans -continue low dose lasix  -Repeat CT as above -As above repeat ultrasound pending  Hypervolemic hyponatremia -Currently on fluid restriction, sodium currently 126 -Continue additional dose of lasix -Continue to monitor BMP  Hypotension -Likely secondary to decompensated cirrhosis -BP does appear to be stable  -Continue midodrine, solu-cortef  Thrombocytopenia -Likely secondary to  decompensated hepatitis C/liver cirrhosis -platelets 106 today -Continue supportive care -continue to monitor   Acute blood loss anemia/gastric and duodenal ulcer -EGD in October 2020 showed grade D esophagitis with nonbleeding gastric ulcer, duodenal ulcer and hiatal hernia -Continue PPI  Hyperglycemia -Hemoglobin A1c  was 3.9 02/09/2019 -Hold off of insulin to avoid hypoglycemia  Acute kidney injury on CKD, stage IIIb -Baseline creatinine approximately 1.7-2 -Nephrology consulted and appreciated -Renal ultrasound suggestive of chronic medical renal disease -Continue to monitor BMP and avoid nephrotoxic agents -creatinine improving, down to 2.29 today (had worsened to 2.46) -monitor intake/output -continue to monitor BMP  History of PTSD/schizoaffective disorder -Patient has been seen by psychiatry and deemed not to have capacity to make medical decisions -Family and friends are helping with those decisions  Physical debility/ambulatory dysfunction -PT recommended -Social work consulted and awaiting placement -Continue fall precautions  Murmur -Discussed with palliative care, states she does not remember murmur during the beginning of his admission.   -Echocardiogram showed an EF of 7075%, grade 1 diastolic dysfunction.  SEM of the anterior mitral valve leaflet in the setting of moderate basal septal hypertrophy and hyperdynamic LV creating a dynamic LVOT gradient.  Mild mitral valve regurgitation.  Goals of care -Given degree of liver cirrhosis with decompensation as well as well perforated bowel with pneumoperitoneum, feel that patient is at high risk for decompensation. -Palliative care is consulted and has been following. -Discussed with general surgery, the possibility of surgery, patient mortality at 1 year would be 50% given his liver disease.  Mortality at operation greatly exceeds this.  DVT Prophylaxis  SCDs  Code Status: Full  Family Communication: None at  bedside  Disposition Plan: Admitted. When patient is medically stable, will likely need SNF. Difficult placement.   Consultants Palliative care Interventional radiology Nephrology General surgery Psychiatry  Gastroenterology  Infectious disease via phone by Dr. Natale Milch  Procedures  Ultrasound-guided paracentesis, yielding 14 L clear yellow fluid on 02/25/2019 PICC line placed by IR on 02/21/2019 Echocardiogram  Antibiotics   Anti-infectives (From admission, onward)   Start     Dose/Rate Route Frequency Ordered Stop   03/12/19 1700  fluconazole (DIFLUCAN) tablet 400 mg     400 mg Oral Daily 03/12/19 1654     03/09/19 0800  cefTRIAXone (ROCEPHIN) 2 g in sodium chloride 0.9 % 100 mL IVPB     2 g 200 mL/hr over 30 Minutes Intravenous Every 24 hours 03/09/19 0703     02/08/19 2200  piperacillin-tazobactam (ZOSYN) IVPB 3.375 g  Status:  Discontinued     3.375 g 12.5 mL/hr over 240 Minutes Intravenous Every 8 hours 02/08/19 1552 03/06/19 1638   02/08/19 1545  piperacillin-tazobactam (ZOSYN) IVPB 3.375 g     3.375 g 100 mL/hr over 30 Minutes Intravenous  Once 02/08/19 1533 02/08/19 1840      Subjective:   [name] seen and examined today.  Complains of pain all over his abdomen today.  Cannot tell me more.  States that he wants to go to the hospital, "the real hospital".  Patient currently denies chest pain, shortness of breath, dizziness or headache. Objective:   Vitals:   03/12/19 0607 03/12/19 1449 03/12/19 2204 03/13/19 0508  BP: 114/68 115/65 119/69 114/65  Pulse: 91 92 95 90  Resp:  18 18 18   Temp: 98.6 F (37 C) 97.6 F (36.4 C) 98 F (36.7 C) 98.3 F (36.8 C)  TempSrc:  Oral Oral Axillary  SpO2: 96% 100% 99% 98%  Weight:      Height:        Intake/Output Summary (Last 24 hours) at 03/13/2019 1133 Last data filed at 03/13/2019 0830 Gross per 24 hour  Intake 900 ml  Output 250 ml  Net 650 ml   [employer]  03/07/19 0500 03/08/19 0500 03/10/19 0439   Weight: 131.5 kg 132 kg 118.3 kg   Exam  General: Well developed, chronically ill-appearing, NAD  HEENT: NCAT, mucous membranes moist.   Cardiovascular: S1 S2 auscultated, SEM, RRR  Respiratory: Clear to auscultation bilaterally   Abdomen: Soft, nontender, nondistended, + bowel sounds  Extremities: Pitting edema in the lower extremities, 3+  Neuro: AAOx1, nonfocal  Psych: Appropriate, question his insight into medical problems  Data Reviewed: I have personally reviewed following labs and imaging studies  CBC: Recent Labs  Lab 03/09/19 0504 03/10/19 0427 03/11/19 0356 03/12/19 0828 03/13/19 0427  WBC 15.2* 22.6* 20.7* 23.7* 29.1*  HGB 8.9* 9.1* 8.7* 8.8* 9.1*  HCT 27.8* 27.6* 27.0* 27.9* 28.3*  MCV 94.6 93.2 93.1 95.5 94.6  PLT 66* 71* 70* 78* 106*   Basic Metabolic Panel: Recent Labs  Lab 03/09/19 0504 03/10/19 0427 03/11/19 0356 03/12/19 0828 03/13/19 0427  NA 125* 127* 127* 126* 126*  K 5.1 5.2* 4.9 5.1 5.0  CL 94* 96* 95* 95* 95*  CO2 23 23 21* 22 20*  GLUCOSE 85 105* 98 99 68*  BUN 104* 109* 108* 107* 109*  CREATININE 2.46* 2.40* 2.22* 2.24* 2.29*  CALCIUM 11.3* 11.2* 11.3* 11.4* 11.4*   GFR: Estimated Creatinine Clearance: 50.8 mL/min (A) (by C-G formula based on SCr of 2.29 mg/dL (H)). Liver Function Tests: No results for input(s): AST, ALT, ALKPHOS, BILITOT, PROT, ALBUMIN in the last 168 hours. No results for input(s): LIPASE, AMYLASE in the last 168 hours. No results for input(s): AMMONIA in the last 168 hours. Coagulation Profile: No results for input(s): INR, PROTIME in the last 168 hours. Cardiac Enzymes: No results for input(s): CKTOTAL, CKMB, CKMBINDEX, TROPONINI in the last 168 hours. BNP (last 3 results) No results for input(s): PROBNP in the last 8760 hours. HbA1C: No results for input(s): HGBA1C in the last 72 hours. CBG: Recent Labs  Lab 03/12/19 1210 03/12/19 1720 03/12/19 2039 03/13/19 0741 03/13/19 0832  GLUCAP 98 113*  96 70 73   Lipid Profile: No results for input(s): CHOL, HDL, LDLCALC, TRIG, CHOLHDL, LDLDIRECT in the last 72 hours. Thyroid Function Tests: No results for input(s): TSH, T4TOTAL, FREET4, T3FREE, THYROIDAB in the last 72 hours. Anemia Panel: No results for input(s): VITAMINB12, FOLATE, FERRITIN, TIBC, IRON, RETICCTPCT in the last 72 hours. Urine analysis:    Component Value Date/Time   COLORURINE YELLOW 02/18/2019 1839   APPEARANCEUR HAZY (A) 02/18/2019 1839   LABSPEC 1.019 02/18/2019 1839   PHURINE 5.0 02/18/2019 1839   GLUCOSEU NEGATIVE 02/18/2019 1839   HGBUR NEGATIVE 02/18/2019 1839   BILIRUBINUR NEGATIVE 02/18/2019 1839   KETONESUR NEGATIVE 02/18/2019 1839   PROTEINUR NEGATIVE 02/18/2019 1839   UROBILINOGEN 1.0 01/19/2013 0015   NITRITE NEGATIVE 02/18/2019 1839   LEUKOCYTESUR NEGATIVE 02/18/2019 1839   Sepsis Labs: @LABRCNTIP (procalcitonin:4,lacticidven:4)  ) Recent Results (from the past 240 hour(s))  Body fluid culture     Status: None   Collection Time: 03/09/19  2:45 PM   Specimen: Abdomen; Peritoneal Fluid  Result Value Ref Range Status   Specimen Description PERITONEAL  Final   Special Requests NONE  Final   Gram Stain   Final    ABUNDANT WBC PRESENT,BOTH PMN AND MONONUCLEAR NO ORGANISMS SEEN    Culture   Final    FEW CANDIDA ALBICANS CRITICAL RESULT CALLED TO, READ BACK BY AND VERIFIED WITH: RN N HARVY [id number] AT 856 AM BY CM Performed at [facility], [address]  [address] [place], [place] [address]    Report Status 03/12/2019 FINAL  Final      Radiology Studies: ECHOCARDIOGRAM COMPLETE  Result Date: 03/11/2019   ECHOCARDIOGRAM REPORT   Patient Name:   MINARD MILLIRONS Date of Exam: 03/11/2019 Medical Rec #:  [id number]   Height:       78.0 in Accession #:    [id number]  Weight:       260.8 lb Date of Birth:  1960/09/19    BSA:          2.53 m Patient Age:    58 years    BP:           111/62 mmHg Patient Gender: M           HR:           93 bpm. Exam  Location:  Inpatient Procedure: 2D Echo Indications:    Murmur 785.2/R01.1  History:        Patient has prior history of Echocardiogram examinations, most                 recent 01/30/2010. Risk Factors:Current Smoker. CKD.  Sonographer:    Ross Ludwig RDCS (AE) Referring Phys: [id number] [facility]  Sonographer Comments: Suboptimal parasternal window and suboptimal subcostal window. Parasteral long and short axis images suboptimal due to chest wall deformity. IMPRESSIONS  1. Left ventricular ejection fraction, by visual estimation, is 70 to 75%. The left ventricle has hyperdynamic function. There is mildly increased left ventricular hypertrophy.  2. Left ventricular diastolic parameters are consistent with Grade I diastolic dysfunction (impaired relaxation).  3. The left ventricle has no regional wall motion abnormalities.  4. There is SAM of the anterior mitral valve leaflet in the setting of moderate basal septal hypertrophy and a hyperdynamic LV creating a dynamic LVOT gradient. Difficult Doppler assessment of gradient to seperate from MR signal. The peak gradient appears to be 65 mmHg.  5. Global right ventricle has normal systolic function.The right ventricular size is normal. No increase in right ventricular wall thickness.  6. Left atrial size was mild-moderately dilated.  7. Right atrial size was mildly dilated.  8. The mitral valve is normal in structure. Mild mitral valve regurgitation. No evidence of mitral stenosis.  9. The tricuspid valve is normal in structure. Tricuspid valve regurgitation is not demonstrated. 10. The aortic valve was not well visualized. Aortic valve regurgitation is not visualized. Diffilcult Doppler assessment of AV in setting of dynamic LVOT gradient. Structurally the valve appears normal, no significant stenosis. The elevated gradient appears to be subvalvular. 11. The pulmonic valve was not well visualized. Pulmonic valve regurgitation is not visualized. FINDINGS  Left  Ventricle: Left ventricular ejection fraction, by visual estimation, is 70 to 75%. The left ventricle has hyperdynamic function. The left ventricle has no regional wall motion abnormalities. There is mildly increased left ventricular hypertrophy. Left ventricular diastolic parameters are consistent with Grade I diastolic dysfunction (impaired relaxation). Normal left atrial pressure. There is SAM of the anterior mitral valve leaflet in the setting of moderate basal septal hypertrophy and a hyperdynamic LV creating a dynamic LVOT gradient. Difficult Doppler assessment of gradient to seperate from MR signal. The peak gradient appears to be 65 mmHg. Right Ventricle: The right ventricular size is normal. No increase in right ventricular wall thickness. Global RV systolic function is has normal systolic function. Left Atrium: Left atrial size was mild-moderately dilated. Right Atrium: Right atrial size was mildly dilated Pericardium: There is no evidence of  pericardial effusion. Mitral Valve: The mitral valve is normal in structure. Mild mitral valve regurgitation. No evidence of mitral valve stenosis by observation. MV peak gradient, 9.5 mmHg. Tricuspid Valve: The tricuspid valve is normal in structure. Tricuspid valve regurgitation is not demonstrated. Aortic Valve: The aortic valve was not well visualized. Aortic valve regurgitation is not visualized. Diffilcult Doppler assessment of AV in setting of dynamic LVOT gradient. Structurally the valve appears normal, no significant stenosis. The elevated gradient appears to be subvalvular. Aortic valve mean gradient measures 32.0 mmHg. Aortic valve peak gradient measures 67.4 mmHg. Pulmonic Valve: The pulmonic valve was not well visualized. Pulmonic valve regurgitation is not visualized. Pulmonic regurgitation is not visualized. Aorta: The aortic root is normal in size and structure. Pulmonary Artery: Indeterminant PASP, inadequate TR jet. IAS/Shunts: No atrial level shunt  detected by color flow Doppler.  LEFT VENTRICLE PLAX 2D LVIDd:         3.70 cm  Diastology LVIDs:         2.30 cm  LV e' lateral:   10.10 cm/s LV PW:         1.10 cm  LV E/e' lateral: 10.9 LV IVS:        1.10 cm  LV e' medial:    7.83 cm/s LVOT diam:     2.00 cm  LV E/e' medial:  14.0 LV SV:         40 ml LV SV Index:   15.55 LVOT Area:     3.14 cm  RIGHT VENTRICLE RV Basal diam:  2.80 cm RV S prime:     22.90 cm/s TAPSE (M-mode): 2.9 cm LEFT ATRIUM              Index       RIGHT ATRIUM           Index LA diam:        2.60 cm  1.03 cm/m  RA Area:     21.50 cm LA Vol (A2C):   131.0 ml 51.84 ml/m RA Volume:   60.90 ml  24.10 ml/m LA Vol (A4C):   76.6 ml  30.31 ml/m LA Biplane Vol: 100.0 ml 39.57 ml/m  AORTIC VALVE AV Vmax:      410.40 cm/s AV Vmean:     259.800 cm/s AV VTI:       0.743 m AV Peak Grad: 67.4 mmHg AV Mean Grad: 32.0 mmHg  AORTA Ao Root diam: 3.10 cm MITRAL VALVE MV Area (PHT): 1.77 cm              SHUNTS MV Peak grad:  9.5 mmHg              Systemic Diam: 2.00 cm MV Mean grad:  4.0 mmHg MV Vmax:       1.54 m/s MV Vmean:      97.2 cm/s MV VTI:        0.36 m MV PHT:        124.12 msec MV Decel Time: 428 msec MV E velocity: 110.00 cm/s 103 cm/s MV A velocity: 123.00 cm/s 70.3 cm/s MV E/A ratio:  0.89        1.5  Carlyle Dolly MD Electronically signed by Carlyle Dolly MD Signature Date/Time: 03/11/2019/1:58:02 PM    Final      Scheduled Meds: . benztropine mesylate  0.5 mg Intramuscular Daily  . Chlorhexidine Gluconate Cloth  6 each Topical Daily  . feeding supplement (ENSURE ENLIVE)  237 mL Oral TID BM  .  fluconazole  400 mg Oral Daily  . furosemide  40 mg Intravenous Once  . gabapentin  100 mg Oral QHS  . haloperidol  2 mg Oral Daily  . hydrocortisone sod succinate (SOLU-CORTEF) inj  25 mg Intravenous Q12H  . midodrine  10 mg Oral TID WC  . multivitamin with minerals  1 tablet Oral Daily  . Muscle Rub   Topical BID  . OLANZapine zydis  5 mg Oral QHS  . pantoprazole  40 mg Oral  BID  . sodium chloride flush  10-40 mL Intracatheter Q12H   Continuous Infusions: . sodium chloride    . sodium chloride Stopped (02/12/19 1737)  . sodium chloride Stopped (02/19/19 1422)  . cefTRIAXone (ROCEPHIN)  IV 2 g (03/13/19 0937)     LOS: 33 days   Time Spent in minutes   45 minutes  Marcelles Clinard D.O. on 03/13/2019 at 11:33 AM  Between 7am to 7pm - Please see pager noted on [email or web address]  After 7pm go to [email or web address]  And look for the night coverage person covering for me after hours  [facility] Office  [phone]

## 2019-03-13 NOTE — TOC Progression Note (Signed)
Transition of Care [facility]) - Progression Note    Patient Details  Name: [name] MRN: [id number] Date of Birth: Oct 16, 1960  Transition of Care [facility]) CM/SW [place], [place] Phone Number: 03/13/2019, 2:51 PM  Clinical Narrative:    CSW and TOC team continuing to follow- pt with no SNF offers, difficult disposition due to multiple barriers including Medicaid only payor, continued medical work up, and mental health needs.    Expected Discharge Plan: [place] Barriers to Discharge: No SNF bed, Continued Medical Work up  Expected Discharge Plan and Services Expected Discharge Plan: [place] In-house Referral: Clinical Social Work Discharge Planning Services: CM Consult Living arrangements for the past 2 months: Apartment  Readmission Risk Interventions Readmission Risk Prevention Plan 01/11/2019  Transportation Screening Complete  Medication Review Press photographer) Complete  PCP or Specialist appointment within 3-5 days of discharge Complete  HRI or [place] Complete  SW Recovery Care/Counseling Consult Complete  Orient Not Applicable  Some recent data might be hidden

## 2019-03-13 NOTE — Progress Notes (Signed)
PT Cancellation Note  Patient Details Name: [name] MRN: [id number] DOB: 1960-08-10   Cancelled Treatment:    Reason Eval/Treat Not Completed: Patient declined, no reason specified. Despite initially verbally agreeing to PT session, the pt became frustrated with bed-level exercises and asked the PT to "put back" his blanket to let him rest. The pt became adamant that he could not perform any further exercises, despite education that mobility and exercises may help with some pain or swelling. PT will continue to follow as time/schedule allow.   Karma Ganja, PT, DPT   Acute Rehabilitation Department [phone]    Otho Bellows 03/13/2019, 3:53 PM

## 2019-03-13 NOTE — Progress Notes (Signed)
Nutrition Follow-up  DOCUMENTATION CODES:   Not applicable  INTERVENTION:   -D/c Ensure Enlive po BID, each supplement provides 350 kcal and 20 grams of protein -D/c MVI with minerals daily  NUTRITION DIAGNOSIS:   Increased nutrient needs related to chronic illness(cirrhosis) as evidenced by estimated needs.  Ongoing  GOAL:   Patient will meet greater than or equal to 90% of their needs  Unmet  MONITOR:   PO intake, Supplement acceptance, Labs, Weight trends, Skin, I & O's  REASON FOR ASSESSMENT:   Consult New TPN/TNA  ASSESSMENT:   58 y.o. male with medical history significant of cirrhosis with splenomegaly, esophageal varices, hepatitis C, adrenal insufficiency, hypothyroidism, schizoaffective disorder, PTSD, GI bleed, neuropathy, chronic kidney disease presented to emergency department due to worsening abdominal pain, distention associated with rectal bleeding for 1 day.  Work-up revealed bowel perforation/pneumoperitoneum.  11/13- s/p paracentesis (removed 2860 ml fluid) 11/17- PICC placed 11/18- s/p LLQ paracentesis (5 L clear yellow fluid removed), advanced to clear liquid diet, pt pulled PICC line 11/21- CT revealed large volume pneumoperitoneum with gas between liver and duodenum 11/22- TPN re-started, PICC placed 11/25- PICC replaced by IR 11/29- s/p RLQ paracentesis (14 L clear yellow fluid removed), advanced to clear liquid diet 12/2- advanced to full liquid diet 12/8- TPN d/c 12/9- advanced to soft diet 12/10- s/p lt paracentesis ( 9 L fluid removed) 12/11- s/p lt paracentesis ( 1 L fluid removed)  Reviewed I/O's: +410 ml x 24 hours and +8.5 L since 02/27/19  UOP: 250 ml x 24 hours  Per MD notes, pt now with abdominal pain and now NPO. Palliative care following for goals of care.   Labs reviewed: CBGS: 70-83.  Diet Order:   Diet Order    None      EDUCATION NEEDS:   Not appropriate for education at this time  Skin:  Skin Assessment: Skin  Integrity Issues: Skin Integrity Issues:: DTI DTI: rt heel Other: MASD to groin  Last BM:  03/12/19  Height:   Ht Readings from Last 1 Encounters:  02/09/19 6\' 6"  (1.981 m)    Weight:   Wt Readings from Last 1 Encounters:  03/10/19 118.3 kg    Ideal Body Weight:  97 kg  BMI:  Body mass index is 30.14 kg/m.  Estimated Nutritional Needs:   Kcal:  7902-4097  Protein:  125-145 grams  Fluid:  Per MD    Saoirse Legere A. Jimmye Norman, RD, LDN, [place] Registered Dietitian II Certified Diabetes Care and Education Specialist Pager: [phone] After hours Pager: [phone]

## 2019-03-13 NOTE — Progress Notes (Signed)
Discussed with Dr. Ree Kida and chart reviewed. Discussed case in detail with Dr. Lane Hacker, PMT Medical Director. Dr. Hilma Favors to evaluate patient this afternoon and give recommendations.  NO CHARGE  Ihor Dow, [place], FNP-C Palliative Medicine Team  Phone: [phone] Fax: [phone]

## 2019-03-14 LAB — COMPREHENSIVE METABOLIC PANEL
ALT: 25 U/L (ref 0–44)
AST: 28 U/L (ref 15–41)
Albumin: 1.5 g/dL — ABNORMAL LOW (ref 3.5–5.0)
Alkaline Phosphatase: 111 U/L (ref 38–126)
Anion gap: 11 (ref 5–15)
BUN: 108 mg/dL — ABNORMAL HIGH (ref 6–20)
CO2: 22 mmol/L (ref 22–32)
Calcium: 11.4 mg/dL — ABNORMAL HIGH (ref 8.9–10.3)
Chloride: 95 mmol/L — ABNORMAL LOW (ref 98–111)
Creatinine, Ser: 2.38 mg/dL — ABNORMAL HIGH (ref 0.61–1.24)
GFR calc Af Amer: 34 mL/min — ABNORMAL LOW (ref >60)
GFR calc non Af Amer: 29 mL/min — ABNORMAL LOW (ref >60)
Glucose, Bld: 97 mg/dL (ref 70–99)
Potassium: 5.3 mmol/L — ABNORMAL HIGH (ref 3.5–5.1)
Sodium: 128 mmol/L — ABNORMAL LOW (ref 135–145)
Total Bilirubin: 0.5 mg/dL (ref 0.3–1.2)
Total Protein: 5.3 g/dL — ABNORMAL LOW (ref 6.5–8.1)

## 2019-03-14 LAB — CBC
HCT: 26.8 % — ABNORMAL LOW (ref 39.0–52.0)
Hemoglobin: 8.6 g/dL — ABNORMAL LOW (ref 13.0–17.0)
MCH: 30.4 pg (ref 26.0–34.0)
MCHC: 32.1 g/dL (ref 30.0–36.0)
MCV: 94.7 fL (ref 80.0–100.0)
Platelets: 92 10*3/uL — ABNORMAL LOW (ref 150–400)
RBC: 2.83 MIL/uL — ABNORMAL LOW (ref 4.22–5.81)
RDW: 18.7 % — ABNORMAL HIGH (ref 11.5–15.5)
WBC: 24.4 10*3/uL — ABNORMAL HIGH (ref 4.0–10.5)
nRBC: 0 % (ref 0.0–0.2)

## 2019-03-14 LAB — GLUCOSE, CAPILLARY
Glucose-Capillary: 105 mg/dL — ABNORMAL HIGH (ref 70–99)
Glucose-Capillary: 126 mg/dL — ABNORMAL HIGH (ref 70–99)
Glucose-Capillary: 227 mg/dL — ABNORMAL HIGH (ref 70–99)
Glucose-Capillary: 212 mg/dL — ABNORMAL HIGH (ref 70–99)

## 2019-03-14 MED ORDER — ENSURE ENLIVE PO LIQD
237.0000 mL | Freq: Two times a day (BID) | ORAL | Status: DC
  Administered 2019-03-14 – 2019-03-16 (×5): 237 mL via ORAL

## 2019-03-14 MED ORDER — SODIUM ZIRCONIUM CYCLOSILICATE 10 G PO PACK
10.0000 g | PACK | Freq: Two times a day (BID) | ORAL | Status: AC
  Administered 2019-03-14 (×2): 10 g via ORAL
  Filled 2019-03-14 (×2): qty 1

## 2019-03-14 NOTE — Progress Notes (Signed)
Abdomen pouch output 243mL.

## 2019-03-14 NOTE — Progress Notes (Signed)
Palliative Care Follow-up  Mr. Manganaro case was discussed in detail with members of his care team and follow-up was requested to help establish reasonable goals of care and to sort through barriers related to is code status which has been a difficult topic for this patient and his community supporters/friends who are helping with medical decision making. He has fluctuating capacity related to hepatic encephaolpathy and is chronically critically ill.   Attempting CPR in the event of cardiac death would not be a medically appropriate intervention due to his prior bowel perforation and very limited functional status with progressive liver disease. I strongly recommend LIMITED CODE STATUS-no CPR or intubation. He is not at a point where he can discuss the comfort care option.  Mr. Dehn appears to have trouble realizing his limitations and how severe his illness is in general andexperiences significant frustration when he cannot do the things he wants to do. Psych has documented lack of capacity in the past, and shared decision making has been challenging.  We will continue to support and assist Mr. Boehringer.  Lane Hacker, DO Palliative Medicine  Time: 35 min Greater than 50%  of this time was spent counseling and coordinating care related to the above assessment and plan.

## 2019-03-14 NOTE — Progress Notes (Signed)
Occupational Therapy Treatment Patient Details Name: [name] MRN: [id number] DOB: 1960/06/07 Today's Date: 03/14/2019    History of present illness Pt is a 58 y/o male admitted secondary to abdominal pain, distention and rectal bleeding. Pt found to have a bowel perforation. Per surgical team, no surgery indicated as pt is a "poor surgical candidate". Paracentesis completed on 11/18 with 5L removed. PMH including but not limited to cirrhosis, hep C, schizoaffective disorder, PTSD and CKD.   OT comments  Pt performing bed mobility; mostly rolling side to side in order to get cleaned up. OTR and RN in room to assist pt. Pt maxA+2 for successful roll- pt does assist using railing. Pt unable or unwilling to assist with bathing task at this time. Pt raised in bed for comfort. Pt denying need to sit EOB as his bottom was so sore already. Pt requires continued OT. OT following acutely.    Follow Up Recommendations  SNF;Supervision/Assistance - 24 hour    Equipment Recommendations  Other (comment)(to be determined by next venue)    Recommendations for Other Services      Precautions / Restrictions Precautions Precautions: Fall Restrictions Weight Bearing Restrictions: No       Mobility Bed Mobility Overal bed mobility: Needs Assistance Bed Mobility: Rolling Rolling: Max assist;+2 for physical assistance         General bed mobility comments: Pt denying need to sit on EOB due to soreness on bottom  Transfers                 General transfer comment: deferred    Balance                                           ADL either performed or assessed with clinical judgement   ADL Overall ADL's : Needs assistance/impaired         Upper Body Bathing: Maximal assistance;Bed level   Lower Body Bathing: Maximal assistance;+2 for physical assistance;+2 for safety/equipment;Bed level Lower Body Bathing Details (indicate cue type and reason): pt not  attempting to assist other than bed mobility                     Functional mobility during ADLs: Maximal assistance;+2 for physical assistance;+2 for safety/equipment;Cueing for safety General ADL Comments: maxA to [place] for ADL at this time; bed level     Vision       Perception     Praxis      Cognition Arousal/Alertness: Awake/alert Behavior During Therapy: Flat affect;Agitated Overall Cognitive Status: No family/caregiver present to determine baseline cognitive functioning Area of Impairment: Following commands;Problem solving;Safety/judgement                       Following Commands: Follows one step commands inconsistently Safety/Judgement: Decreased awareness of deficits;Decreased awareness of safety   Problem Solving: Decreased initiation;Difficulty sequencing;Requires verbal cues General Comments: Pt can be very manipulative. Easily agitated. Wants to be in control and do things his way.        Exercises     Shoulder Instructions       General Comments Pt reporting to have had a bowel movement and wanting to be changed. OTR spent session cleaning pt up and RN came in to assist too    Pertinent Vitals/ Pain       Pain Assessment: Faces Faces  Pain Scale: Hurts even more Pain Location: bottom Pain Descriptors / Indicators: Grimacing Pain Intervention(s): Limited activity within patient's tolerance  Home Living                                          Prior Functioning/Environment              Frequency  Min 2X/week        Progress Toward Goals  OT Goals(current goals can now be found in the care plan section)  Progress towards OT goals: Progressing toward goals  Acute Rehab OT Goals Patient Stated Goal: not stated OT Goal Formulation: With patient Time For Goal Achievement: 03/19/19 Potential to Achieve Goals: Fair ADL Goals Pt Will Perform Grooming: bed level;with min assist Pt Will Perform Upper Body  Bathing: bed level;with mod assist Pt Will Perform Lower Body Bathing: with mod assist;bed level Additional ADL Goal #1: Pt will complete bed mobility at mod A level to prepare for EOB ADLs.  Plan Discharge plan remains appropriate;Frequency remains appropriate    Co-evaluation                 AM-PAC OT "6 Clicks" Daily Activity     Outcome Measure   Help from another person eating meals?: A Little Help from another person taking care of personal grooming?: A Lot Help from another person toileting, which includes using toliet, bedpan, or urinal?: Total Help from another person bathing (including washing, rinsing, drying)?: A Lot Help from another person to put on and taking off regular upper body clothing?: A Lot Help from another person to put on and taking off regular lower body clothing?: Total 6 Click Score: 11    End of Session    OT Visit Diagnosis: Muscle weakness (generalized) (M62.81);Pain;Other symptoms and signs involving cognitive function;Unsteadiness on feet (R26.81) Pain - part of body: Ankle and joints of foot   Activity Tolerance Patient limited by pain   Patient Left in bed;with call bell/phone within reach;with nursing/sitter in room   Nurse Communication Mobility status        Time: 2130-8657 OT Time Calculation (min): 44 min  Charges: OT General Charges $OT Visit: 1 Visit OT Treatments $Self Care/Home Management : 23-37 mins $Therapeutic Activity: 8-22 mins  Cristi Loron) Glendell Docker OTR/L Acute Rehabilitation Services Pager: [phone] Office: [phone]    [name] 03/14/2019, 5:22 PM

## 2019-03-14 NOTE — Progress Notes (Signed)
Nutrition Follow-up  DOCUMENTATION CODES:   Not applicable  INTERVENTION:   -Ensure Enlive po BID, each supplement provides 350 kcal and 20 grams of protein -Magic cup TID with meals, each supplement provides 290 kcal and 9 grams of protein -Continue MVI with minerals daily -Double protein portions with meals  NUTRITION DIAGNOSIS:   Increased nutrient needs related to chronic illness(cirrhosis) as evidenced by estimated needs.  Ongoing  GOAL:   Patient will meet greater than or equal to 90% of their needs  Progressing   MONITOR:   PO intake, Supplement acceptance, Labs, Weight trends, Skin, I & O's  REASON FOR ASSESSMENT:   Consult New TPN/TNA  ASSESSMENT:   58 y.o. male with medical history significant of cirrhosis with splenomegaly, esophageal varices, hepatitis C, adrenal insufficiency, hypothyroidism, schizoaffective disorder, PTSD, GI bleed, neuropathy, chronic kidney disease presented to emergency department due to worsening abdominal pain, distention associated with rectal bleeding for 1 day.  Work-up revealed bowel perforation/pneumoperitoneum.  11/13- s/p paracentesis (removed 2860 ml fluid) 11/17- PICC placed 11/18- s/p LLQ paracentesis (5 L clear yellow fluid removed), advanced to clear liquid diet, pt pulled PICC line 11/21- CT revealed large volume pneumoperitoneum with gas between liver and duodenum 11/22- TPN re-started, PICC placed 11/25- PICC replaced by IR 11/29- s/p RLQ paracentesis (14 L clear yellow fluid removed), advanced to clear liquid diet 12/2- advanced to full liquid diet 12/8- TPN d/c 12/9- advanced to soft diet 12/10- s/p lt paracentesis ( 9 L fluid removed) 12/11- s/p lt paracentesis ( 1 L fluid removed)  Reviewed I/O's: -650 ml x 24 hours and +6.6 L since 02/28/19  UOP: 900 ml x 24 hours  Pt receiving nursing care at time of visit.   Diet was resumed today. Pt consumed 75% of breakfast per doc flowsheets. Plan for ultrasound  today.   Palliative care continues to follow for goals of care. Pt refusing to work with therapy.   Labs reviewed: Na: 128, K: 5.3, CBGS: 105-118.   Diet Order:   Diet Order            DIET SOFT Room service appropriate? Yes; Fluid consistency: Thin  Diet effective now              EDUCATION NEEDS:   Not appropriate for education at this time  Skin:  Skin Assessment: Skin Integrity Issues: Skin Integrity Issues:: DTI DTI: rt heel Other: MASD to groin  Last BM:  03/12/19  Height:   Ht Readings from Last 1 Encounters:  02/09/19 6\' 6"  (1.981 m)    Weight:   Wt Readings from Last 1 Encounters:  03/10/19 118.3 kg    Ideal Body Weight:  97 kg  BMI:  Body mass index is 30.14 kg/m.  Estimated Nutritional Needs:   Kcal:  2423-5361  Protein:  125-145 grams  Fluid:  Per MD    Alejandrina Raimer A. Jimmye Norman, RD, LDN, [place] Registered Dietitian II Certified Diabetes Care and Education Specialist Pager: [phone] After hours Pager: [phone]

## 2019-03-14 NOTE — Progress Notes (Signed)
PROGRESS NOTE    [name]  EGB:[id number] DOB: 1960-10-03 DOA: 02/08/2019 PCP: Fleet Contras, MD   Brief Narrative:  58 year old with history of cirrhosis with splenomegaly, hepatitis C, esophageal varices, adrenal insufficiency, hypothyroidism, schizoaffective disorder, PTSD, CKD stage IIIa initially brought to the hospital for worsening abdominal pain, distention and rectal bleeding. Work-up showed perforated bowel and pneumoperitoneum. General surgery recommended nonoperative management with IV antibiotics and bowel rest. Due to worsening of the pain, repeat CT of the abdomen pelvis showed free intra-abdominal fluid and pneumoperitoneum. There is also perforated bowel near pyloric or stomach. Underwent UGI on 11/16 showing focal contained perforation in distal stomach. Palliative care team is following. Psychiatry evaluate the patient and stated does not have capacity to make his own decisions.  Patient with suspected SBP, on ceftriaxone.  Fluid culture showed Candida, started on fluconazole.  Patient still with worsening leukocytosis.  Now today complaining of abdominal pain.  Pending repeat ultrasound.  Subjective:   Patient reports watery bowel movement, denies any chest pain, shortness of breath, ports some abdominal pain .  Assessment & Plan   Severe septic shock secondary to perforated bowel/peritoneum and gastric perforation/ SBP -Upper GI series showed gastric perforation upon admission 02/08/2019 -GI and general surgery consulted and appreciated -Previous hospitalist discussed with infectious disease, recommended to discontinue Zosyn and follow clinically -Patient remains afebrile -Of note he was on TPN which is now been discontinued, on 03/06/2019.  If leukocytosis continues to rise or patient has worsening symptoms or febrile, will obtain blood cultures given that he is high risk for bloodstream infection given his recent prolonged use of TPN -Unfortunately WBC count has  been trending upward for several days.  Fluid culture results show few candida albicans.  Placed on ceftriaxone empirically and have started fluconazole. -Obtain CT abdomen on 03/09/2019: Large volume pneumoperitoneum and ascites decreased from prior study. -General surgery reconsulted, no surgical intervention warranted at this time. -Patient has been tolerating his diet however this morning he has now started complaining of abdominal pain -Patient remains with significant leukocytosis -Have ordered repeat ultrasound -Surgical input greatly appreciated, patient extremely frail, with high risk for mortality for surgery, so conservative approach has been recommended by general surgery, palliative approach if possible.  Acute metabolic encephalopathy -Suspect multifactorial including sepsis versus uncontrolled underlying psychiatric illness -Continue to reorient -Not entirely sure if patient truly understands and has insight into current health situation -Responding well to Zyprexa and Haldol  Decompensated hepatitis C with liver cirrhosis and ascites -Avoid hepatotoxic agents -Paracentesis as needed, will require paracentesis 11/13, 11/18,12/11. -Fluid culture as above shows a few Candida albicans  Hypervolemic hyponatremia -Currently on fluid restriction, sodium currently 126 -Continue additional dose of lasix -Continue to monitor BMP  Hypotension -Likely secondary to decompensated cirrhosis -BP does appear to be stable  -Continue midodrine, solu-cortef  Thrombocytopenia -Likely secondary to decompensated hepatitis C/liver cirrhosis -platelets 92 today -Continue supportive care -continue to monitor   Acute blood loss anemia/gastric and duodenal ulcer -EGD in October 2020 showed grade D esophagitis with nonbleeding gastric ulcer, duodenal ulcer and hiatal hernia -Continue PPI  Hyperglycemia -Hemoglobin A1c was 3.9 02/09/2019 -Hold off of insulin to avoid hypoglycemia  Acute  kidney injury on CKD, stage IIIb -Baseline creatinine approximately 1.7-2 -Nephrology consulted and appreciated -Renal ultrasound suggestive of chronic medical renal disease -Continue to monitor BMP and avoid nephrotoxic agents -creatinine improving, down to 2.29 today (had worsened to 2.46) -monitor intake/output -continue to monitor BMP  History of PTSD/schizoaffective disorder -Patient has  been seen by psychiatry and deemed not to have capacity to make medical decisions -Family and friends are helping with those decisions  Physical debility/ambulatory dysfunction -PT recommended -Social work consulted and awaiting placement -Continue fall precautions  Murmur -Discussed with palliative care, states she does not remember murmur during the beginning of his admission.   -Echocardiogram showed an EF of 7075%, grade 1 diastolic dysfunction.  SEM of the anterior mitral valve leaflet in the setting of moderate basal septal hypertrophy and hyperdynamic LV creating a dynamic LVOT gradient.  Mild mitral valve regurgitation.  Goals of care -Given degree of liver cirrhosis with decompensation as well as well perforated bowel with pneumoperitoneum, feel that patient is at high risk for decompensation, as well high risk for mortality, as we will have discussed with general surgery patient mortality at 1 year would be 50% given his liver disease.  Mortality at operation greatly exceeds this.  At this point if patient having cardiac arrest, performing CPR, would not change the outcome, and would not be medically appropriate intervention due to his decompensated liver failure, and his bowel perforation, with very limited functional status, I have discussed with palliative medicine Dr Phillips OdorGolding strongly recommend Limited CODE STATUS/no CPR or intubation, will proceed with 2 physicians DNR, especially with no available family member, I have informed his friend about Two  physicians DNR.  DVT Prophylaxis   SCDs  Code Status: Full, transition to DNR today  Family Communication: None at bedside  Disposition Plan: Admitted. When patient is medically stable, will likely need SNF. Difficult placement.   Consultants Palliative care Interventional radiology Nephrology General surgery Psychiatry  Gastroenterology  Infectious disease via phone by Dr. Natale MilchLancaster  Procedures  Ultrasound-guided paracentesis, yielding 14 L clear yellow fluid on 02/25/2019 PICC line placed by IR on 02/21/2019 Echocardiogram  Antibiotics   Anti-infectives (From admission, onward)   Start     Dose/Rate Route Frequency Ordered Stop   03/12/19 1700  fluconazole (DIFLUCAN) tablet 400 mg     400 mg Oral Daily 03/12/19 1654     03/09/19 0800  cefTRIAXone (ROCEPHIN) 2 g in sodium chloride 0.9 % 100 mL IVPB     2 g 200 mL/hr over 30 Minutes Intravenous Every 24 hours 03/09/19 0703     02/08/19 2200  piperacillin-tazobactam (ZOSYN) IVPB 3.375 g  Status:  Discontinued     3.375 g 12.5 mL/hr over 240 Minutes Intravenous Every 8 hours 02/08/19 1552 03/06/19 1638   02/08/19 1545  piperacillin-tazobactam (ZOSYN) IVPB 3.375 g     3.375 g 100 mL/hr over 30 Minutes Intravenous  Once 02/08/19 1533 02/08/19 1840       Objective:   Vitals:   03/13/19 1439 03/13/19 2147 03/14/19 0446 03/14/19 1521  BP: (!) 100/55 106/64 110/66 120/70  Pulse: 99 87 76 85  Resp: 18 16 17 16   Temp: 98.7 F (37.1 C) 98 F (36.7 C) (!) 97.4 F (36.3 C) 97.6 F (36.4 C)  TempSrc: Oral Oral Oral Oral  SpO2: 95% 98% 96% 97%  Weight:      Height:        Intake/Output Summary (Last 24 hours) at 03/14/2019 1525 Last data filed at 03/14/2019 1330 Gross per 24 hour  Intake 630 ml  Output 400 ml  Net 230 ml   Filed Weights   03/07/19 0500 03/08/19 0500 03/10/19 0439  Weight: 131.5 kg 132 kg 118.3 kg   Exam  Well Developed male, chronically ill-appearing, laying in bed in no apparent distress,  awake alert x1, easily distracted With  air entry bilaterally, clear to auscultation, no wheezing Regular rate and rhythm, no rubs murmurs or gallops Abdomen distended with ascites wave, soft, nontender Extremities +3 edema bilaterally, no clubbing or cyanosis.  Data Reviewed: I have personally reviewed following labs and imaging studies  CBC: Recent Labs  Lab 03/10/19 0427 03/11/19 0356 03/12/19 0828 03/13/19 0427 03/14/19 0516  WBC 22.6* 20.7* 23.7* 29.1* 24.4*  HGB 9.1* 8.7* 8.8* 9.1* 8.6*  HCT 27.6* 27.0* 27.9* 28.3* 26.8*  MCV 93.2 93.1 95.5 94.6 94.7  PLT 71* 70* 78* 106* 92*   Basic Metabolic Panel: Recent Labs  Lab 03/10/19 0427 03/11/19 0356 03/12/19 0828 03/13/19 0427 03/14/19 0516  NA 127* 127* 126* 126* 128*  K 5.2* 4.9 5.1 5.0 5.3*  CL 96* 95* 95* 95* 95*  CO2 23 21* 22 20* 22  GLUCOSE 105* 98 99 68* 97  BUN 109* 108* 107* 109* 108*  CREATININE 2.40* 2.22* 2.24* 2.29* 2.38*  CALCIUM 11.2* 11.3* 11.4* 11.4* 11.4*   GFR: Estimated Creatinine Clearance: 48.9 mL/min (A) (by C-G formula based on SCr of 2.38 mg/dL (H)). Liver Function Tests: Recent Labs  Lab 03/14/19 0516  AST 28  ALT 25  ALKPHOS 111  BILITOT 0.5  PROT 5.3*  ALBUMIN 1.5*   No results for input(s): LIPASE, AMYLASE in the last 168 hours. No results for input(s): AMMONIA in the last 168 hours. Coagulation Profile: No results for input(s): INR, PROTIME in the last 168 hours. Cardiac Enzymes: No results for input(s): CKTOTAL, CKMB, CKMBINDEX, TROPONINI in the last 168 hours. BNP (last 3 results) No results for input(s): PROBNP in the last 8760 hours. HbA1C: No results for input(s): HGBA1C in the last 72 hours. CBG: Recent Labs  Lab 03/13/19 1204 03/13/19 1651 03/13/19 2147 03/14/19 0743 03/14/19 1159  GLUCAP 81 82 118* 105* 126*   Lipid Profile: No results for input(s): CHOL, HDL, LDLCALC, TRIG, CHOLHDL, LDLDIRECT in the last 72 hours. Thyroid Function Tests: No results for input(s): TSH, T4TOTAL, FREET4, T3FREE,  THYROIDAB in the last 72 hours. Anemia Panel: No results for input(s): VITAMINB12, FOLATE, FERRITIN, TIBC, IRON, RETICCTPCT in the last 72 hours. Urine analysis:    Component Value Date/Time   COLORURINE YELLOW 02/18/2019 1839   APPEARANCEUR HAZY (A) 02/18/2019 1839   LABSPEC 1.019 02/18/2019 1839   PHURINE 5.0 02/18/2019 1839   GLUCOSEU NEGATIVE 02/18/2019 1839   HGBUR NEGATIVE 02/18/2019 1839   BILIRUBINUR NEGATIVE 02/18/2019 1839   KETONESUR NEGATIVE 02/18/2019 1839   PROTEINUR NEGATIVE 02/18/2019 1839   UROBILINOGEN 1.0 01/19/2013 0015   NITRITE NEGATIVE 02/18/2019 1839   LEUKOCYTESUR NEGATIVE 02/18/2019 1839   Sepsis Labs: @LABRCNTIP (procalcitonin:4,lacticidven:4)  ) Recent Results (from the past 240 hour(s))  Body fluid culture     Status: None   Collection Time: 03/09/19  2:45 PM   Specimen: Abdomen; Peritoneal Fluid  Result Value Ref Range Status   Specimen Description PERITONEAL  Final   Special Requests NONE  Final   Gram Stain   Final    ABUNDANT WBC PRESENT,BOTH PMN AND MONONUCLEAR NO ORGANISMS SEEN    Culture   Final    FEW CANDIDA ALBICANS CRITICAL RESULT CALLED TO, READ BACK BY AND VERIFIED WITH: RN N HARVY [id number] AT 856 AM BY CM Performed at [facility], [address], [place], [place] [address]    Report Status 03/12/2019 FINAL  Final      Radiology Studies: US Abdomen Complete  Result Date:  03/13/2019 CLINICAL DATA:  Abdominal pain.  Cirrhosis. EXAM: ABDOMEN ULTRASOUND COMPLETE COMPARISON:  Noncontrast CT on 03/09/2019 FINDINGS: Technically difficult evaluation due to patient habitus and bowel gas. Gallbladder: Not visualized. Common bile duct: Diameter: Not visualized. Liver: Diffuse coarsening of hepatic echotexture and capsular nodularity, consistent with cirrhosis. No liver mass identified. Portal vein is patent on color Doppler imaging with normal direction of blood flow towards the liver. IVC: No abnormality visualized. Pancreas: Not  visualized due to overlying bowel gas. Spleen: Not visualized Right Kidney: Length: 11.2 cm. Echogenicity within normal limits. A few simple renal cysts are seen, largest measured 3.3 cm. No mass or hydronephrosis visualized. Left Kidney: Not visualized due to patient habitus and bowel gas. Abdominal aorta: No proximal abdominal aortic aneurysm visualized. Distal abdominal aorta not visualized due to overlying bowel gas. Other findings: Mild-to-moderate ascites is seen. IMPRESSION: Technically suboptimal exam due to patient habitus and bowel gas. Hepatic cirrhosis and mild-to-moderate ascites. Electronically Signed   By: Marlaine Hind M.D.   On: 03/13/2019 19:05     Scheduled Meds: . benztropine mesylate  0.5 mg Intramuscular Daily  . Chlorhexidine Gluconate Cloth  6 each Topical Daily  . feeding supplement (ENSURE ENLIVE)  237 mL Oral BID BM  . fluconazole  400 mg Oral Daily  . gabapentin  100 mg Oral QHS  . haloperidol  2 mg Oral Daily  . hydrocortisone sod succinate (SOLU-CORTEF) inj  25 mg Intravenous Q12H  . midodrine  10 mg Oral TID WC  . multivitamin with minerals  1 tablet Oral Daily  . Muscle Rub   Topical BID  . OLANZapine zydis  5 mg Oral QHS  . pantoprazole  40 mg Oral BID  . sodium chloride flush  10-40 mL Intracatheter Q12H  . sodium zirconium cyclosilicate  10 g Oral BID   Continuous Infusions: . sodium chloride    . sodium chloride Stopped (02/12/19 1737)  . sodium chloride Stopped (02/19/19 1422)  . cefTRIAXone (ROCEPHIN)  IV 2 g (03/14/19 1255)     LOS: 76 days   Phillips Climes M.D . on 03/14/2019 at 3:25 PM  Between 7am to 7pm - Please see pager noted on [email or web address]  After 7pm go to [email or web address]  And look for the night coverage person covering for me after hours  [facility] Office  [phone]

## 2019-03-14 NOTE — Progress Notes (Signed)
Placed purple DNR armband on pt's arm.

## 2019-03-15 LAB — CBC
HCT: 29 % — ABNORMAL LOW (ref 39.0–52.0)
Hemoglobin: 9.2 g/dL — ABNORMAL LOW (ref 13.0–17.0)
MCH: 30.3 pg (ref 26.0–34.0)
MCHC: 31.7 g/dL (ref 30.0–36.0)
MCV: 95.4 fL (ref 80.0–100.0)
Platelets: 114 10*3/uL — ABNORMAL LOW (ref 150–400)
RBC: 3.04 MIL/uL — ABNORMAL LOW (ref 4.22–5.81)
RDW: 18.6 % — ABNORMAL HIGH (ref 11.5–15.5)
WBC: 29.5 10*3/uL — ABNORMAL HIGH (ref 4.0–10.5)
nRBC: 0 % (ref 0.0–0.2)

## 2019-03-15 LAB — COMPREHENSIVE METABOLIC PANEL
ALT: 27 U/L (ref 0–44)
AST: 28 U/L (ref 15–41)
Albumin: 1.5 g/dL — ABNORMAL LOW (ref 3.5–5.0)
Alkaline Phosphatase: 132 U/L — ABNORMAL HIGH (ref 38–126)
Anion gap: 10 (ref 5–15)
BUN: 109 mg/dL — ABNORMAL HIGH (ref 6–20)
CO2: 22 mmol/L (ref 22–32)
Calcium: 11.2 mg/dL — ABNORMAL HIGH (ref 8.9–10.3)
Chloride: 92 mmol/L — ABNORMAL LOW (ref 98–111)
Creatinine, Ser: 2.57 mg/dL — ABNORMAL HIGH (ref 0.61–1.24)
GFR calc Af Amer: 31 mL/min — ABNORMAL LOW (ref >60)
GFR calc non Af Amer: 26 mL/min — ABNORMAL LOW (ref >60)
Glucose, Bld: 101 mg/dL — ABNORMAL HIGH (ref 70–99)
Potassium: 4.9 mmol/L (ref 3.5–5.1)
Sodium: 124 mmol/L — ABNORMAL LOW (ref 135–145)
Total Bilirubin: 0.7 mg/dL (ref 0.3–1.2)
Total Protein: 5.5 g/dL — ABNORMAL LOW (ref 6.5–8.1)

## 2019-03-15 LAB — GLUCOSE, CAPILLARY
Glucose-Capillary: 140 mg/dL — ABNORMAL HIGH (ref 70–99)
Glucose-Capillary: 108 mg/dL — ABNORMAL HIGH (ref 70–99)
Glucose-Capillary: 100 mg/dL — ABNORMAL HIGH (ref 70–99)
Glucose-Capillary: 101 mg/dL — ABNORMAL HIGH (ref 70–99)

## 2019-03-15 MED ORDER — HYDROCORTISONE NA SUCCINATE PF 100 MG IJ SOLR
25.0000 mg | Freq: Every day | INTRAMUSCULAR | Status: DC
  Administered 2019-03-16: 10:00:00 25 mg via INTRAVENOUS
  Filled 2019-03-15: qty 2

## 2019-03-15 NOTE — Progress Notes (Signed)
Physical Therapy Discharge Patient Details Name: [name] MRN: [id number] DOB: 1960/07/31 Today's Date: 03/15/2019 Time: 1345-1401 PT Time Calculation (min) (ACUTE ONLY): 16 min  Patient discharged from PT services secondary to patient has refused 3 (three) consecutive times without medical reason.  Please see latest therapy progress note for current level of functioning and progress toward goals.    Progress and discharge plan discussed with patient and/or caregiver: Patient unable to participate in discharge planning and no caregivers available  GP     [place] 03/15/2019, 2:46 PM

## 2019-03-15 NOTE — Progress Notes (Signed)
Physical Therapy Treatment Patient Details Name: [name] MRN: [id number] DOB: Jan 02, 1961 Today's Date: 03/15/2019    History of Present Illness Pt is a 58 y/o male admitted secondary to abdominal pain, distention and rectal bleeding. Pt found to have a bowel perforation. Per surgical team, no surgery indicated as pt is a "poor surgical candidate". Paracentesis completed on 11/18 with 5L removed. PMH including but not limited to cirrhosis, hep C, schizoaffective disorder, PTSD and CKD.    PT Comments    Pt very limited secondary to agitation, refusal and actively resisting any movement. Pt noted to be towards the end of the bed with bilateral feet pressed into the end plate of the bed. Therapist attempting to reposition in bed and bilateral LEs; however, pt constantly crying out and stating "No! Don't touch! Leave me alone! Let me rest!" despite max encouragement and explanation for the need to reposition and pt being at high risk for skin breakdown. Therapist assisting pt with taking a couple of bites of lunch food at end of session, and pt spitting them back out asking for different food. Pt's RN was notified.     Follow Up Recommendations  SNF;Supervision/Assistance - 24 hour     Equipment Recommendations  Hospital bed;Wheelchair (measurements PT);Wheelchair cushion (measurements PT);Other (comment)(Hoyer lift)    Recommendations for Other Services       Precautions / Restrictions Precautions Precautions: Fall Precaution Comments: at risk for skin deterioration Restrictions Weight Bearing Restrictions: No    Mobility  Bed Mobility Overal bed mobility: Needs Assistance             General bed mobility comments: total A x2 for repositioning in bed; pt actively resisting any movement and continuously saying "No! Don't touch! Leave me alone! Let me rest!"; however, pt requiring repositioning as his feet were pressed up against the end of the bed and pt is at high risk for  skin break down. Once repositioned, therapist noted some erythema and moisture to the dorsal aspect of pt's L foot (RN was notified)  Transfers                 General transfer comment: pt refusing  Ambulation/Gait                 Stairs             Wheelchair Mobility    Modified Rankin (Stroke Patients Only)       Balance                                            Cognition Arousal/Alertness: Awake/alert Behavior During Therapy: Flat affect;Agitated Overall Cognitive Status: No family/caregiver present to determine baseline cognitive functioning Area of Impairment: Safety/judgement                         Safety/Judgement: Decreased awareness of deficits;Decreased awareness of safety            Exercises      General Comments        Pertinent Vitals/Pain Pain Assessment: Faces Faces Pain Scale: Hurts even more Pain Location: generalized Pain Descriptors / Indicators: Grimacing;Moaning Pain Intervention(s): Monitored during session;Repositioned    Home Living                      Prior Function  PT Goals (current goals can now be found in the care plan section) Acute Rehab PT Goals PT Goal Formulation: With patient Time For Goal Achievement: 03/15/19 Potential to Achieve Goals: Poor Progress towards PT goals: Not progressing toward goals - comment(pt agitated, refusing and actively resisting)    Frequency    Other (Comment)(d/c PT services)      PT Plan Frequency needs to be updated    Co-evaluation              AM-PAC PT "6 Clicks" Mobility   Outcome Measure  Help needed turning from your back to your side while in a flat bed without using bedrails?: Total Help needed moving from lying on your back to sitting on the side of a flat bed without using bedrails?: Total Help needed moving to and from a bed to a chair (including a wheelchair)?: Total Help needed  standing up from a chair using your arms (e.g., wheelchair or bedside chair)?: Total Help needed to walk in hospital room?: Total Help needed climbing 3-5 steps with a railing? : Total 6 Click Score: 6    End of Session   Activity Tolerance: Treatment limited secondary to agitation;Other (comment)(pt refusal) Patient left: in bed;with call bell/phone within reach;Other (comment)(with heels floating) Nurse Communication: Mobility status PT Visit Diagnosis: Other abnormalities of gait and mobility (R26.89)     Time: 1345-1401 PT Time Calculation (min) (ACUTE ONLY): 16 min  Charges:  $Self Care/Home Management: 8-22                     Arletta Bale, DPT  Acute Rehabilitation Services Pager [phone] Office [phone]     Alessandra Bevels Euretha Najarro 03/15/2019, 2:43 PM

## 2019-03-15 NOTE — TOC Progression Note (Addendum)
Transition of Care [facility]) - Progression Note    Patient Details  Name: [name] MRN: [id number] Date of Birth: 1960-08-07  [facility]) CM/SW [place], [place] Phone Number: 03/15/2019, 2:45 PM  Clinical Narrative:    Aware pt now a DNR- continue to follow and work on placement. At this time CSW continues to seek Medicaid placement for pt. Placement remains difficult, pt with ongoing medical care needs.    Expected Discharge Plan: [place] Barriers to Discharge: No SNF bed, Continued Medical Work up  Expected Discharge Plan and Services Expected Discharge Plan: [place] In-house Referral: Clinical Social Work Discharge Planning Services: CM Consult   Living arrangements for the past 2 months: Apartment     Readmission Risk Interventions Readmission Risk Prevention Plan 01/11/2019  Transportation Screening Complete  Medication Review Press photographer) Complete  PCP or Specialist appointment within 3-5 days of discharge Complete  HRI or [place] Complete  SW Recovery Care/Counseling Consult Complete  [place] Not Applicable  Some recent data might be hidden

## 2019-03-15 NOTE — Plan of Care (Signed)
  Problem: Education: Goal: Knowledge of General Education information will improve Description Including pain rating scale, medication(s)/side effects and non-pharmacologic comfort measures Outcome: Progressing   Problem: Health Behavior/Discharge Planning: Goal: Ability to manage health-related needs will improve Outcome: Progressing   Problem: Clinical Measurements: Goal: Ability to maintain clinical measurements within normal limits will improve Outcome: Progressing   Problem: Elimination: Goal: Will not experience complications related to bowel motility Outcome: Progressing Goal: Will not experience complications related to urinary retention Outcome: Progressing   Problem: Pain Managment: Goal: General experience of comfort will improve Outcome: Progressing   Problem: Safety: Goal: Ability to remain free from injury will improve Outcome: Progressing   Problem: Skin Integrity: Goal: Risk for impaired skin integrity will decrease Outcome: Progressing   

## 2019-03-15 NOTE — Progress Notes (Signed)
PROGRESS NOTE    [name]  ZOX:[id number] DOB: 09/16/1960 DOA: 02/08/2019 PCP: Fleet ContrasAvbuere, Edwin, MD   Brief Narrative:  58 year old with history of cirrhosis with splenomegaly, hepatitis C, esophageal varices, adrenal insufficiency, hypothyroidism, schizoaffective disorder, PTSD, CKD stage IIIa initially brought to the hospital for worsening abdominal pain, distention and rectal bleeding. Work-up showed perforated bowel and pneumoperitoneum. General surgery recommended nonoperative management with IV antibiotics and bowel rest. Due to worsening of the pain, repeat CT of the abdomen pelvis showed free intra-abdominal fluid and pneumoperitoneum. There is also perforated bowel near pyloric or stomach. Underwent UGI on 11/16 showing focal contained perforation in distal stomach. Palliative care team is following. Psychiatry evaluate the patient and stated does not have capacity to make his own decisions.  Patient with suspected SBP, on ceftriaxone.  Fluid culture showed Candida, started on fluconazole.  Patient still with worsening leukocytosis. With complains of abdominal pain as well.  Subjective:   Patient reports is any chest pain, any shortness of breath, reports he would like to be more ambulatory.  Assessment & Plan   Severe septic shock secondary to perforated bowel/peritoneum and gastric perforation/ SBP -Upper GI series showed gastric perforation upon admission 02/08/2019 -GI and general surgery consulted and appreciated -Previous hospitalist discussed with infectious disease, recommended to discontinue Zosyn and follow clinically -Patient remains afebrile -Of note he was on TPN which is now been discontinued, on 03/06/2019.   - Fluid culture results show few candida albicans.  Placed on ceftriaxone empirically and have started fluconazole. -CT abdomen on 03/09/2019: Large volume pneumoperitoneum and ascites decreased from prior study. -Surgical input greatly appreciated, patient  extremely frail, with high risk for mortality for surgery, so conservative approach has been recommended by general surgery, palliative approach if possible. -Continue with IV Rocephin and Diflucan for now, [place] ptosis trending up, but abdomen is less tender today, continue to monitor closely.  Acute metabolic encephalopathy -Suspect multifactorial including sepsis versus uncontrolled underlying psychiatric illness -Continue to reorient -Not entirely sure if patient truly understands and has insight into current health situation -Responding well to Zyprexa and Haldol  Decompensated hepatitis C with liver cirrhosis and ascites -Avoid hepatotoxic agents -Paracentesis as needed, will require paracentesis 11/13, 11/18,12/11. -Fluid culture as above shows a few Candida albicans  Hypervolemic hyponatremia -Currently on fluid restriction, sodium currently 126 -Continue additional dose of lasix -Continue to monitor BMP  Hypotension -Likely secondary to decompensated cirrhosis -BP does appear to be stable  -Continue midodrine, will decrease Solu-Cortef to once daily hoping to stop soon  Thrombocytopenia -Likely secondary to decompensated hepatitis C/liver cirrhosis -Continue supportive care -continue to monitor   Acute blood loss anemia/gastric and duodenal ulcer -EGD in October 2020 showed grade D esophagitis with nonbleeding gastric ulcer, duodenal ulcer and hiatal hernia -Continue PPI  Hyperglycemia -Hemoglobin A1c was 3.9 02/09/2019 -Hold off of insulin to avoid hypoglycemia  Acute kidney injury on CKD, stage IIIb -Baseline creatinine approximately 1.7-2 -Nephrology consulted and appreciated -Renal ultrasound suggestive of chronic medical renal disease -Monitor closely especially with sodium trending up to 124, and creatinine trending up to 2.4  History of PTSD/schizoaffective disorder -Patient has been seen by psychiatry and deemed not to have capacity to make medical  decisions -Family and friends are helping with those decisions  Physical debility/ambulatory dysfunction -PT recommended -Social work consulted and awaiting placement -Continue fall precautions   Goals of care -Given degree of liver cirrhosis with decompensation as well as well perforated bowel with pneumoperitoneum, feel that patient is at  high risk for decompensation, as well high risk for mortality, as we will have discussed with general surgery patient mortality at 1 year would be 50% given his liver disease.  Mortality at operation greatly exceeds this.  At this point if patient having cardiac arrest, performing CPR, would not change the outcome, and would not be medically appropriate intervention due to his decompensated liver failure, and his bowel perforation, with very limited functional status, I have discussed with palliative medicine Dr Phillips Odor strongly recommend Limited CODE STATUS/no CPR or intubation, will proceed with 2 physicians DNR, especially with no available family member, I have informed his friend about Two  physicians DNR.  DVT Prophylaxis  SCDs  Code Status: DNR  Family Communication: Have updated available contact(friend) via phone, patient has no family living here.  Disposition Plan: Admitted. When patient is medically stable, will likely need SNF. Difficult placement.   Consultants Palliative care Interventional radiology Nephrology General surgery Psychiatry  Gastroenterology  Infectious disease via phone by Dr. Natale Milch  Procedures  Ultrasound-guided paracentesis, yielding 14 L clear yellow fluid on 02/25/2019 PICC line placed by IR on 02/21/2019 Echocardiogram  Antibiotics   Anti-infectives (From admission, onward)   Start     Dose/Rate Route Frequency Ordered Stop   03/12/19 1700  fluconazole (DIFLUCAN) tablet 400 mg     400 mg Oral Daily 03/12/19 1654     03/09/19 0800  cefTRIAXone (ROCEPHIN) 2 g in sodium chloride 0.9 % 100 mL IVPB     2  g 200 mL/hr over 30 Minutes Intravenous Every 24 hours 03/09/19 0703     02/08/19 2200  piperacillin-tazobactam (ZOSYN) IVPB 3.375 g  Status:  Discontinued     3.375 g 12.5 mL/hr over 240 Minutes Intravenous Every 8 hours 02/08/19 1552 03/06/19 1638   02/08/19 1545  piperacillin-tazobactam (ZOSYN) IVPB 3.375 g     3.375 g 100 mL/hr over 30 Minutes Intravenous  Once 02/08/19 1533 02/08/19 1840       Objective:   Vitals:   03/14/19 0446 03/14/19 1521 03/14/19 2044 03/15/19 0441  BP: 110/66 120/70 117/70 110/71  Pulse: 76 85 88 89  Resp: 17 16 18    Temp: (!) 97.4 F (36.3 C) 97.6 F (36.4 C) 97.7 F (36.5 C) (!) 97.5 F (36.4 C)  TempSrc: Oral Oral Oral Oral  SpO2: 96% 97% 100% 97%  Weight:      Height:        Intake/Output Summary (Last 24 hours) at 03/15/2019 1518 Last data filed at 03/15/2019 0500 Gross per 24 hour  Intake --  Output 150 ml  Net -150 ml   Filed Weights   03/07/19 0500 03/08/19 0500 03/10/19 0439  Weight: 131.5 kg 132 kg 118.3 kg   Exam  Well-developed male, laying in bed in no apparent distress, chronically ill-appearing, awake, alert, conversant, but easily distracted, fair judgment and insight . Good air entry bilaterally, no wheezing  Regular rate and rhythm, no rubs murmurs gallops  Abdomen obese, with ascites present, nontender . Extremities +3 edema bilaterally, no clubbing or cyanosis.  Data Reviewed: I have personally reviewed following labs and imaging studies  CBC: Recent Labs  Lab 03/11/19 0356 03/12/19 0828 03/13/19 0427 03/14/19 0516 03/15/19 0345  WBC 20.7* 23.7* 29.1* 24.4* 29.5*  HGB 8.7* 8.8* 9.1* 8.6* 9.2*  HCT 27.0* 27.9* 28.3* 26.8* 29.0*  MCV 93.1 95.5 94.6 94.7 95.4  PLT 70* 78* 106* 92* 114*   Basic Metabolic Panel: Recent Labs  Lab 03/11/19 0356 03/12/19  1884 03/13/19 0427 03/14/19 0516 03/15/19 0345  NA 127* 126* 126* 128* 124*  K 4.9 5.1 5.0 5.3* 4.9  CL 95* 95* 95* 95* 92*  CO2 21* 22 20* 22 22   GLUCOSE 98 99 68* 97 101*  BUN 108* 107* 109* 108* 109*  CREATININE 2.22* 2.24* 2.29* 2.38* 2.57*  CALCIUM 11.3* 11.4* 11.4* 11.4* 11.2*   GFR: Estimated Creatinine Clearance: 45.3 mL/min (A) (by C-G formula based on SCr of 2.57 mg/dL (H)). Liver Function Tests: Recent Labs  Lab 03/14/19 0516 03/15/19 0345  AST 28 28  ALT 25 27  ALKPHOS 111 132*  BILITOT 0.5 0.7  PROT 5.3* 5.5*  ALBUMIN 1.5* 1.5*   No results for input(s): LIPASE, AMYLASE in the last 168 hours. No results for input(s): AMMONIA in the last 168 hours. Coagulation Profile: No results for input(s): INR, PROTIME in the last 168 hours. Cardiac Enzymes: No results for input(s): CKTOTAL, CKMB, CKMBINDEX, TROPONINI in the last 168 hours. BNP (last 3 results) No results for input(s): PROBNP in the last 8760 hours. HbA1C: No results for input(s): HGBA1C in the last 72 hours. CBG: Recent Labs  Lab 03/14/19 1159 03/14/19 1645 03/14/19 2117 03/15/19 0752 03/15/19 1120  GLUCAP 126* 227* 212* 140* 108*   Lipid Profile: No results for input(s): CHOL, HDL, LDLCALC, TRIG, CHOLHDL, LDLDIRECT in the last 72 hours. Thyroid Function Tests: No results for input(s): TSH, T4TOTAL, FREET4, T3FREE, THYROIDAB in the last 72 hours. Anemia Panel: No results for input(s): VITAMINB12, FOLATE, FERRITIN, TIBC, IRON, RETICCTPCT in the last 72 hours. Urine analysis:    Component Value Date/Time   COLORURINE YELLOW 02/18/2019 1839   APPEARANCEUR HAZY (A) 02/18/2019 1839   LABSPEC 1.019 02/18/2019 1839   PHURINE 5.0 02/18/2019 1839   GLUCOSEU NEGATIVE 02/18/2019 1839   HGBUR NEGATIVE 02/18/2019 1839   BILIRUBINUR NEGATIVE 02/18/2019 1839   KETONESUR NEGATIVE 02/18/2019 1839   PROTEINUR NEGATIVE 02/18/2019 1839   UROBILINOGEN 1.0 01/19/2013 0015   NITRITE NEGATIVE 02/18/2019 1839   LEUKOCYTESUR NEGATIVE 02/18/2019 1839   Sepsis Labs: @LABRCNTIP (procalcitonin:4,lacticidven:4)  ) Recent Results (from the past 240 hour(s))   Body fluid culture     Status: None   Collection Time: 03/09/19  2:45 PM   Specimen: Abdomen; Peritoneal Fluid  Result Value Ref Range Status   Specimen Description PERITONEAL  Final   Special Requests NONE  Final   Gram Stain   Final    ABUNDANT WBC PRESENT,BOTH PMN AND MONONUCLEAR NO ORGANISMS SEEN    Culture   Final    FEW CANDIDA ALBICANS CRITICAL RESULT CALLED TO, READ BACK BY AND VERIFIED WITH: RN N HARVY [id number] AT 856 AM BY CM Performed at [facility], [address] [place], [place]    Report Status 03/12/2019 FINAL  Final      Radiology Studies: 03/14/2019 Abdomen Complete  Result Date: 03/13/2019 CLINICAL DATA:  Abdominal pain.  Cirrhosis. EXAM: ABDOMEN ULTRASOUND COMPLETE COMPARISON:  Noncontrast CT on 03/09/2019 FINDINGS: Technically difficult evaluation due to patient habitus and bowel gas. Gallbladder: Not visualized. Common bile duct: Diameter: Not visualized. Liver: Diffuse coarsening of hepatic echotexture and capsular nodularity, consistent with cirrhosis. No liver mass identified. Portal vein is patent on color Doppler imaging with normal direction of blood flow towards the liver. IVC: No abnormality visualized. Pancreas: Not visualized due to overlying bowel gas. Spleen: Not visualized Right Kidney: Length: 11.2 cm. Echogenicity within normal limits. A few simple renal cysts are seen, largest measured 3.3  cm. No mass or hydronephrosis visualized. Left Kidney: Not visualized due to patient habitus and bowel gas. Abdominal aorta: No proximal abdominal aortic aneurysm visualized. Distal abdominal aorta not visualized due to overlying bowel gas. Other findings: Mild-to-moderate ascites is seen. IMPRESSION: Technically suboptimal exam due to patient habitus and bowel gas. Hepatic cirrhosis and mild-to-moderate ascites. Electronically Signed   By: Marlaine Hind M.D.   On: 03/13/2019 19:05     Scheduled Meds: . benztropine mesylate  0.5 mg Intramuscular Daily  .  Chlorhexidine Gluconate Cloth  6 each Topical Daily  . feeding supplement (ENSURE ENLIVE)  237 mL Oral BID BM  . fluconazole  400 mg Oral Daily  . gabapentin  100 mg Oral QHS  . haloperidol  2 mg Oral Daily  . hydrocortisone sod succinate (SOLU-CORTEF) inj  25 mg Intravenous Q12H  . midodrine  10 mg Oral TID WC  . multivitamin with minerals  1 tablet Oral Daily  . Muscle Rub   Topical BID  . OLANZapine zydis  5 mg Oral QHS  . pantoprazole  40 mg Oral BID  . sodium chloride flush  10-40 mL Intracatheter Q12H   Continuous Infusions: . sodium chloride    . sodium chloride Stopped (02/12/19 1737)  . sodium chloride Stopped (02/19/19 1422)  . cefTRIAXone (ROCEPHIN)  IV 2 g (03/15/19 0810)     LOS: 67 days   Phillips Climes M.D . on 03/15/2019 at 3:18 PM  Between 7am to 7pm - Please see pager noted on [email or web address]  After 7pm go to [email or web address]  And look for the night coverage person covering for me after hours  [facility] Office  [phone]

## 2019-03-15 NOTE — Progress Notes (Signed)
Case discussed in detail with attending hospitalist. Agree that DNR order is appropriate based on medical condition. CPR would not be successful in the event of cardiac arrest.  Lane Hacker, DO Palliative Medicine

## 2019-03-16 LAB — GLUCOSE, CAPILLARY
Glucose-Capillary: 96 mg/dL (ref 70–99)
Glucose-Capillary: 88 mg/dL (ref 70–99)
Glucose-Capillary: 105 mg/dL — ABNORMAL HIGH (ref 70–99)
Glucose-Capillary: 108 mg/dL — ABNORMAL HIGH (ref 70–99)

## 2019-03-16 LAB — CBC
HCT: 28.2 % — ABNORMAL LOW (ref 39.0–52.0)
Hemoglobin: 9 g/dL — ABNORMAL LOW (ref 13.0–17.0)
MCH: 30.2 pg (ref 26.0–34.0)
MCHC: 31.9 g/dL (ref 30.0–36.0)
MCV: 94.6 fL (ref 80.0–100.0)
Platelets: 123 10*3/uL — ABNORMAL LOW (ref 150–400)
RBC: 2.98 MIL/uL — ABNORMAL LOW (ref 4.22–5.81)
RDW: 18.8 % — ABNORMAL HIGH (ref 11.5–15.5)
WBC: 29.2 10*3/uL — ABNORMAL HIGH (ref 4.0–10.5)
nRBC: 0 % (ref 0.0–0.2)

## 2019-03-16 LAB — COMPREHENSIVE METABOLIC PANEL
ALT: 28 U/L (ref 0–44)
AST: 27 U/L (ref 15–41)
Albumin: 1.4 g/dL — ABNORMAL LOW (ref 3.5–5.0)
Alkaline Phosphatase: 127 U/L — ABNORMAL HIGH (ref 38–126)
Anion gap: 9 (ref 5–15)
BUN: 112 mg/dL — ABNORMAL HIGH (ref 6–20)
CO2: 22 mmol/L (ref 22–32)
Calcium: 11.4 mg/dL — ABNORMAL HIGH (ref 8.9–10.3)
Chloride: 95 mmol/L — ABNORMAL LOW (ref 98–111)
Creatinine, Ser: 2.72 mg/dL — ABNORMAL HIGH (ref 0.61–1.24)
GFR calc Af Amer: 29 mL/min — ABNORMAL LOW (ref >60)
GFR calc non Af Amer: 25 mL/min — ABNORMAL LOW (ref >60)
Glucose, Bld: 98 mg/dL (ref 70–99)
Potassium: 4.9 mmol/L (ref 3.5–5.1)
Sodium: 126 mmol/L — ABNORMAL LOW (ref 135–145)
Total Bilirubin: 0.4 mg/dL (ref 0.3–1.2)
Total Protein: 5.1 g/dL — ABNORMAL LOW (ref 6.5–8.1)

## 2019-03-16 MED ORDER — ALBUMIN HUMAN 25 % IV SOLN
25.0000 g | Freq: Four times a day (QID) | INTRAVENOUS | Status: AC
  Administered 2019-03-16 – 2019-03-17 (×4): 25 g via INTRAVENOUS
  Filled 2019-03-16 (×4): qty 100

## 2019-03-16 MED ORDER — MIDODRINE HCL 2.5 MG PO TABS
10.0000 mg | ORAL_TABLET | Freq: Two times a day (BID) | ORAL | Status: DC
  Administered 2019-03-16 – 2019-03-17 (×3): 10 mg via ORAL
  Filled 2019-03-16: qty 4
  Filled 2019-03-16 (×2): qty 2
  Filled 2019-03-16: qty 4

## 2019-03-16 NOTE — TOC Progression Note (Signed)
Transition of Care [facility]) - Progression Note    Patient Details  Name: [name] MRN: [id number] Date of Birth: [birth year]  Transition of Care [facility]) CM/SW [place], [place] Phone Number: 03/16/2019, 4:45 PM  Clinical Narrative:    [facility] team continues to follow at this time for assistance with disposition as appropriate.    Expected Discharge Plan: [place] Barriers to Discharge: No SNF bed, Continued Medical Work up  Expected Discharge Plan and Services Expected Discharge Plan: [place] In-house Referral: Clinical Social Work Discharge Planning Services: CM Consult   Living arrangements for the past 2 months: Apartment                   Readmission Risk Interventions Readmission Risk Prevention Plan 01/11/2019  Transportation Screening Complete  Medication Review Press photographer) Complete  PCP or Specialist appointment within 3-5 days of discharge Complete  HRI or [place] Complete  SW Recovery Care/Counseling Consult Complete  [place] Not Applicable  Some recent data might be hidden

## 2019-03-16 NOTE — Plan of Care (Signed)

## 2019-03-16 NOTE — Progress Notes (Signed)
PROGRESS NOTE    [name]  VWU:[id number] DOB: 12/18/1960 DOA: 02/08/2019 PCP: Fleet ContrasAvbuere, Edwin, MD   Brief Narrative:  58 year old with history of cirrhosis with splenomegaly, hepatitis C, esophageal varices, adrenal insufficiency, hypothyroidism, schizoaffective disorder, PTSD, CKD stage IIIa initially brought to the hospital for worsening abdominal pain, distention and rectal bleeding. Work-up showed perforated bowel and pneumoperitoneum. General surgery recommended nonoperative management with IV antibiotics and bowel rest. Due to worsening of the pain, repeat CT of the abdomen pelvis showed free intra-abdominal fluid and pneumoperitoneum. There is also perforated bowel near pyloric or stomach. Underwent UGI on 11/16 showing focal contained perforation in distal stomach. Palliative care team is following. Psychiatry evaluate the patient and stated does not have capacity to make his own decisions.  Patient with suspected SBP, on ceftriaxone.  Fluid culture showed Candida, started on fluconazole.     Subjective:   Patient denies any chest pain, any shortness of breath, no abdominal pain today .  Assessment & Plan   Severe septic shock secondary to perforated bowel/peritoneum and gastric perforation/ SBP -Upper GI series showed gastric perforation upon admission 02/08/2019 -GI and general surgery consulted and appreciated -Surgical input greatly appreciated, patient extremely frail, with high risk for mortality for surgery, so conservative approach has been recommended by general surgery, palliative approach if possible. -Of note he was on TPN which is now been discontinued, on 03/06/2019.  He is advanced to soft diet currently. -CT abdomen on 03/09/2019: Large volume pneumoperitoneum and ascites decreased from prior study. - Fluid culture results show few candida albicans.  Placed on ceftriaxone empirically and have started fluconazole.(He was on Zosyn initially during hospital  stay) -Patient remains afebrile -Difficult situation to manage, in this patient with decompensated liver cirrhosis and ascites, with perforated bowel/peritoneum and SBP, overall prognosis is very poor given the fact he is not a surgical candidate, and high risk for mortality during his surgery, available option currently is to manage with antibiotic/antifungal.  Acute metabolic encephalopathy -Suspect multifactorial including sepsis versus uncontrolled underlying psychiatric illness -Not entirely sure if patient truly understands and has insight into current health situation -Responding well to Zyprexa and Haldol  Decompensated hepatitis C with liver cirrhosis and ascites -Avoid hepatotoxic agents -Paracentesis as needed, will require paracentesis 11/13, 11/18,12/11. -Fluid culture as above shows a few Candida albicans  Acute kidney injury on CKD, stage IIIb -Baseline creatinine approximately 1.7-2 -Nephrology consulted and appreciated -Renal ultrasound suggestive of chronic medical renal disease -Retinae continue to trend up despite holding diuresis, it is 2.7 today, possibly hepatorenal syndrome, already on Midodrin, will give albumin today, holding diuresis.  Hypervolemic hyponatremia -Currently on fluid restriction, unable to give any Lasix or Aldactone given worsening renal function.  Hypotension -Likely secondary to decompensated cirrhosis -BP does appear to be stable  -Continue midodrine,  decreased Solu-Cortef to once daily hoping to stop soon  Thrombocytopenia -Likely secondary to decompensated hepatitis C/liver cirrhosis -Continue supportive care -continue to monitor   Acute blood loss anemia/gastric and duodenal ulcer -EGD in October 2020 showed grade D esophagitis with nonbleeding gastric ulcer, duodenal ulcer and hiatal hernia -Continue PPI  Hyperglycemia -Hemoglobin A1c was 3.9 02/09/2019 -Hold off of insulin to avoid hypoglycemia   History of  PTSD/schizoaffective disorder -Patient has been seen by psychiatry and deemed not to have capacity to make medical decisions -Family and friends are helping with those decisions  Physical debility/ambulatory dysfunction -PT recommended -Social work consulted and awaiting placement -Continue fall precautions   Goals of care -Given degree  of liver cirrhosis with decompensation as well as well perforated bowel with pneumoperitoneum, feel that patient is at high risk for decompensation, as well high risk for mortality, as we will have discussed with general surgery patient mortality at 1 year would be 50% given his liver disease.  Mortality at operation greatly exceeds this.  At this point if patient having cardiac arrest, performing CPR, would not change the outcome, and would not be medically appropriate intervention due to his decompensated liver failure, and his bowel perforation, with very limited functional status, I have discussed with palliative medicine Dr Hilma Favors strongly recommend Limited CODE STATUS/no CPR or intubation, will proceed with 2 physicians DNR, especially with no available family member, I have informed his friend about Two  physicians DNR.  DVT Prophylaxis  SCDs  Code Status: DNR  Family Communication: None at bedside  Disposition Plan: Admitted. When patient is medically stable, will likely need SNF. Difficult placement.   Consultants Palliative care Interventional radiology Nephrology General surgery Psychiatry  Gastroenterology  Infectious disease via phone by Dr. Avon Gully  Procedures  Ultrasound-guided paracentesis, yielding 14 L clear yellow fluid on 02/25/2019 PICC line placed by IR on 02/21/2019 Echocardiogram  Antibiotics   Anti-infectives (From admission, onward)   Start     Dose/Rate Route Frequency Ordered Stop   03/12/19 1700  fluconazole (DIFLUCAN) tablet 400 mg     400 mg Oral Daily 03/12/19 1654     03/09/19 0800  cefTRIAXone (ROCEPHIN) 2 g  in sodium chloride 0.9 % 100 mL IVPB     2 g 200 mL/hr over 30 Minutes Intravenous Every 24 hours 03/09/19 0703     02/08/19 2200  piperacillin-tazobactam (ZOSYN) IVPB 3.375 g  Status:  Discontinued     3.375 g 12.5 mL/hr over 240 Minutes Intravenous Every 8 hours 02/08/19 1552 03/06/19 1638   02/08/19 1545  piperacillin-tazobactam (ZOSYN) IVPB 3.375 g     3.375 g 100 mL/hr over 30 Minutes Intravenous  Once 02/08/19 1533 02/08/19 1840       Objective:   Vitals:   03/15/19 0441 03/15/19 1518 03/15/19 2054 03/16/19 0607  BP: 110/71 112/74 109/67 (!) 106/55  Pulse: 89 88 94 91  Resp:  18 18 14   Temp: (!) 97.5 F (36.4 C) 97.7 F (36.5 C) 98 F (36.7 C) (!) 97.5 F (36.4 C)  TempSrc: Oral Oral Oral Oral  SpO2: 97% 97% 100% 97%  Weight:      Height:        Intake/Output Summary (Last 24 hours) at 03/16/2019 1306 Last data filed at 03/16/2019 0604 Gross per 24 hour  Intake 240 ml  Output 325 ml  Net -85 ml   Filed Weights   03/07/19 0500 03/08/19 0500 03/10/19 0439  Weight: 131.5 kg 132 kg 118.3 kg   Exam  Obese male, laying in bed, in no apparent distress, currently ill-appearing, awake alert x1, impaired judgment and insight Good air entry bilaterally, no wheezing  Regular rate and rhythm, no rubs or gallops  Abdomen obese, nontender, with mild ascites  Extremities +3 edema bilaterally, no clubbing or cyanosis .  Data Reviewed: I have personally reviewed following labs and imaging studies  CBC: Recent Labs  Lab 03/12/19 0828 03/13/19 0427 03/14/19 0516 03/15/19 0345 03/16/19 0420  WBC 23.7* 29.1* 24.4* 29.5* 29.2*  HGB 8.8* 9.1* 8.6* 9.2* 9.0*  HCT 27.9* 28.3* 26.8* 29.0* 28.2*  MCV 95.5 94.6 94.7 95.4 94.6  PLT 78* 106* 92* 114* 440*   Basic Metabolic  Panel: Recent Labs  Lab 03/12/19 0828 03/13/19 0427 03/14/19 0516 03/15/19 0345 03/16/19 0420  NA 126* 126* 128* 124* 126*  K 5.1 5.0 5.3* 4.9 4.9  CL 95* 95* 95* 92* 95*  CO2 22 20* 22 22 22    GLUCOSE 99 68* 97 101* 98  BUN 107* 109* 108* 109* 112*  CREATININE 2.24* 2.29* 2.38* 2.57* 2.72*  CALCIUM 11.4* 11.4* 11.4* 11.2* 11.4*   GFR: Estimated Creatinine Clearance: 42.8 mL/min (A) (by C-G formula based on SCr of 2.72 mg/dL (H)). Liver Function Tests: Recent Labs  Lab 03/14/19 0516 03/15/19 0345 03/16/19 0420  AST 28 28 27   ALT 25 27 28   ALKPHOS 111 132* 127*  BILITOT 0.5 0.7 0.4  PROT 5.3* 5.5* 5.1*  ALBUMIN 1.5* 1.5* 1.4*   No results for input(s): LIPASE, AMYLASE in the last 168 hours. No results for input(s): AMMONIA in the last 168 hours. Coagulation Profile: No results for input(s): INR, PROTIME in the last 168 hours. Cardiac Enzymes: No results for input(s): CKTOTAL, CKMB, CKMBINDEX, TROPONINI in the last 168 hours. BNP (last 3 results) No results for input(s): PROBNP in the last 8760 hours. HbA1C: No results for input(s): HGBA1C in the last 72 hours. CBG: Recent Labs  Lab 03/15/19 1120 03/15/19 1714 03/15/19 2056 03/16/19 0916 03/16/19 1155  GLUCAP 108* 100* 101* 96 88   Lipid Profile: No results for input(s): CHOL, HDL, LDLCALC, TRIG, CHOLHDL, LDLDIRECT in the last 72 hours. Thyroid Function Tests: No results for input(s): TSH, T4TOTAL, FREET4, T3FREE, THYROIDAB in the last 72 hours. Anemia Panel: No results for input(s): VITAMINB12, FOLATE, FERRITIN, TIBC, IRON, RETICCTPCT in the last 72 hours. Urine analysis:    Component Value Date/Time   COLORURINE YELLOW 02/18/2019 1839   APPEARANCEUR HAZY (A) 02/18/2019 1839   LABSPEC 1.019 02/18/2019 1839   PHURINE 5.0 02/18/2019 1839   GLUCOSEU NEGATIVE 02/18/2019 1839   HGBUR NEGATIVE 02/18/2019 1839   BILIRUBINUR NEGATIVE 02/18/2019 1839   KETONESUR NEGATIVE 02/18/2019 1839   PROTEINUR NEGATIVE 02/18/2019 1839   UROBILINOGEN 1.0 01/19/2013 0015   NITRITE NEGATIVE 02/18/2019 1839   LEUKOCYTESUR NEGATIVE 02/18/2019 1839   Sepsis Labs: @LABRCNTIP (procalcitonin:4,lacticidven:4)  ) Recent  Results (from the past 240 hour(s))  Body fluid culture     Status: None   Collection Time: 03/09/19  2:45 PM   Specimen: Abdomen; Peritoneal Fluid  Result Value Ref Range Status   Specimen Description PERITONEAL  Final   Special Requests NONE  Final   Gram Stain   Final    ABUNDANT WBC PRESENT,BOTH PMN AND MONONUCLEAR NO ORGANISMS SEEN    Culture   Final    FEW CANDIDA ALBICANS CRITICAL RESULT CALLED TO, READ BACK BY AND VERIFIED WITH: RN N HARVY [id number] AT 856 AM BY CM Performed at [facility], [address], [place], [place] [address]    Report Status 03/12/2019 FINAL  Final      Radiology Studies: No results found.   Scheduled Meds: . benztropine mesylate  0.5 mg Intramuscular Daily  . Chlorhexidine Gluconate Cloth  6 each Topical Daily  . feeding supplement (ENSURE ENLIVE)  237 mL Oral BID BM  . fluconazole  400 mg Oral Daily  . gabapentin  100 mg Oral QHS  . haloperidol  2 mg Oral Daily  . hydrocortisone sod succinate (SOLU-CORTEF) inj  25 mg Intravenous Daily  . midodrine  10 mg Oral TID WC  . multivitamin with minerals  1 tablet Oral Daily  . Muscle  Rub   Topical BID  . OLANZapine zydis  5 mg Oral QHS  . pantoprazole  40 mg Oral BID  . sodium chloride flush  10-40 mL Intracatheter Q12H   Continuous Infusions: . sodium chloride    . sodium chloride Stopped (02/12/19 1737)  . sodium chloride Stopped (02/19/19 1422)  . albumin human 25 g (03/16/19 0859)  . cefTRIAXone (ROCEPHIN)  IV 2 g (03/16/19 0801)     LOS: 36 days   Huey Bienenstock M.D . on 03/16/2019 at 1:06 PM  Between 7am to 7pm - Please see pager noted on [email or web address]  After 7pm go to [email or web address]  And look for the night coverage person covering for me after hours  [facility] Office  [phone]

## 2019-03-16 NOTE — Progress Notes (Signed)
Daily Progress Note   Patient Name: [name]       Date: 03/16/2019 DOB: 01/14/1961  Age: 58 y.o. MRN#: [id number] Attending Physician: Starleen ArmsElgergawy, Dawood S, MD Primary Care Physician: Fleet ContrasAvbuere, Edwin, MD Admit Date: 02/08/2019  Reason for Consultation/Follow-up: Establishing goals of care and Psychosocial/spiritual support  Examined patient at bedside.  Held GOC conversation with [name] and [name].  Subjective: Updated [name] and [name] on current state.  Kidney function continues to worsen, Labs continue to worsen, and [name] wants to eat pizza. [name]'s friends expressed how grateful they were to the doctors for making the DNR decision.  They just did not feel comfortable making it.  We talked about the high likelihood that [name] will not survive this hospitalization.  They understand.  They ask that we keep treating him with antibiotics but given that his life is short they ask that we consider his comfort and quality of life as well.   I explained that normally we would not want [name] to eat food - but it is something he really wants.  Food is his quality of life.  [name] and [name] want [name] to be allowed to eat whatever he wants despite the risk of decline.  We talked about focusing more on his comfort and happiness and less on interventions not aimed at his comfort.  I expressed that at some point [name] will decline to the point that [facility] will be [facility].  At that time I will call Benjamin Holesadira and ask her if we can transfer him to hospice house.  Nydia BoutonNadira and [name] were both in agreement with that plan.  [name] and [name] felt strongly that [facility] about [facility] or his eventual decline would only be cruel and upsetting to him.  He does not have insight to understand his situation  and would feel very threatened by such a conversation.   Assessment: WBC climbing.  Creatinine rising.  Patient appears comfortable.    Patient Profile/HPI:  Palliative Care consult requested for this 58 y.o. 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  CT of abdomen and pelvis revealed pneumoperitoneum from bowel perforation.  Surgery  was consulted but patient was not felt to be a surgical candidate due to the high risk from comorbidities.  He has been treated conservatively.  Palliative care was consulted to help address goals.  Length of Stay: 36  Current Medications: Scheduled Meds:  . benztropine mesylate  0.5 mg Intramuscular Daily  . Chlorhexidine Gluconate Cloth  6 each Topical Daily  . fluconazole  400 mg Oral Daily  . gabapentin  100 mg Oral QHS  . haloperidol  2 mg Oral Daily  . hydrocortisone sod succinate (SOLU-CORTEF) inj  25 mg Intravenous Daily  . midodrine  10 mg Oral BID WC  . Muscle Rub   Topical BID  . OLANZapine zydis  5 mg Oral QHS  . pantoprazole  40 mg Oral BID  . sodium chloride flush  10-40 mL Intracatheter Q12H    Continuous Infusions: . sodium chloride    . sodium chloride Stopped (02/12/19 1737)  . sodium chloride Stopped (02/19/19 1422)  . albumin human 25 g (03/16/19 1439)  . cefTRIAXone (ROCEPHIN)  IV 2 g (03/16/19 0801)    PRN Meds: Place/Maintain arterial line **AND** sodium chloride, sodium chloride, haloperidol lactate, HYDROmorphone (DILAUDID) injection, lidocaine (PF), lidocaine, ondansetron (ZOFRAN) IV, sodium chloride flush, sodium chloride flush, sodium chloride flush  Physical Exam        Chronically ill appearing male, Awake, Conversant CV rrr with systolic murmur resp no distress Abdomen distended,  tight.   Vital Signs: BP 99/63 (BP Location: Left Arm)   Pulse 89   Temp 97.8 F (36.6 C) (Oral)   Resp 14   Ht 6\' 6"  (1.981 m)   Wt 118.3 kg   SpO2 97%   BMI 30.14 kg/m  SpO2: SpO2: 97 % O2 Device: O2 Device: Room Air O2 Flow Rate:    Intake/output summary:   Intake/Output Summary (Last 24 hours) at 03/16/2019 1750 Last data filed at 03/16/2019 0604 Gross per 24 hour  Intake --  Output 225 ml  Net -225 ml   LBM: Last BM Date: 03/15/19 Baseline Weight: Weight: 99.8 kg Most recent weight: Weight: 118.3 kg       Palliative Assessment/Data: 10%    Flowsheet Rows     Most Recent Value  Intake Tab  Referral Department  Hospitalist  Unit at Time of Referral  ER  Date Notified  02/09/19  Palliative Care Type  New Palliative care  Reason for referral  Clarify Goals of Care  Date of Admission  02/08/19  Date first seen by Palliative Care  02/11/19  # of days Palliative referral response time  2 Day(s)  # of days IP prior to Palliative referral  1  Clinical Assessment  Psychosocial & Spiritual Assessment  Palliative Care Outcomes      Patient Active Problem List   Diagnosis Date Noted  . Personality disorder ([place])   . Ascites   . Kidney disease   . Rheumatoid arthritis involving multiple sites ([place])   . Pressure injury of skin 03/01/2019  . Advanced care planning/counseling discussion   . Abdominal distention   . Acute renal failure with acute tubular necrosis superimposed on stage 3b chronic kidney disease ([place])   . Palliative care encounter   . Palliative care by specialist   . AKI (acute kidney injury) ([place])   . Goals of care, counseling/discussion   . Septic shock ([place]) 02/09/2019  . Pneumoperitoneum   . Central venous catheter in place   . Abdominal pain 02/08/2019  . Bowel perforation ([place])  02/08/2019  . Bleeding per rectum   . Acute gastric ulcer   . Duodenal ulcer   . Acute esophagitis   . Acute blood loss anemia   . Hematemesis 01/08/2019   . Prolonged QT interval 01/08/2019  . Esophageal varices in cirrhosis ([facility]) 01/08/2019  . CKD (chronic kidney disease) stage 3, GFR 30-59 ml/min 01/08/2019  . Suicidal ideation 01/08/2019  . Closed displaced fracture of neck of left fifth metacarpal bone 01/08/2019  . GI bleed 08/07/2018  . Acute abdominal pain   . Cirrhosis ([facility])   . Generalized abdominal pain   . Hematemesis with nausea   . Evaluation by psychiatric service required   . Acute upper gastrointestinal bleeding 10/15/2017  . PTSD (post-traumatic stress disorder)   . Hypothyroidism   . History of adrenal insufficiency   . H/O diabetes insipidus   . Schizoaffective disorder ([facility]) 11/08/2011  . Normocytic anemia   . Liver disease   . Hepatitis C   . GERD (gastroesophageal reflux disease)     Palliative Care Plan    Recommendations/Plan:  Friends (medical decision making surrogates [name] and Gae Dry) are understanding and accepting of patient's declining medical status  Recommend focusing on treatments that enhance patient's comfort and quality of life, and de-escalating interventions and medications that do not provide long term benefit but merely prolong his current state.  Goals of Care and Additional Recommendations:  Limitations on Scope of Treatment: No aggressive interventions.  Focus on comfort an quality of life.  Code Status:  DNR  Prognosis:   days to weeks.  Due to gastric perforation, ESLD, Declining kidney function, severe protein calorie malnutrition.  Discharge Planning:  To Be Determined.    Care plan was discussed with Friends [name] and Gae Dry) as well as [facility] MD.  Thank you for allowing the Palliative Medicine Team to assist in the care of this patient.  Total time spent:  60 min.     Greater than 50%  of this time was spent counseling and coordinating care related to the above assessment and plan.  Norvel Richards, PA-C Palliative Medicine  Please contact Palliative  MedicineTeam phone at [phone] for questions and concerns between 7 am - 7 pm.   Please see AMION for individual provider pager numbers.

## 2019-03-17 LAB — GLUCOSE, CAPILLARY
Glucose-Capillary: 131 mg/dL — ABNORMAL HIGH (ref 70–99)
Glucose-Capillary: 180 mg/dL — ABNORMAL HIGH (ref 70–99)
Glucose-Capillary: 199 mg/dL — ABNORMAL HIGH (ref 70–99)
Glucose-Capillary: 94 mg/dL (ref 70–99)

## 2019-03-17 NOTE — Progress Notes (Signed)
PROGRESS NOTE    [name]  BMW:[id number] DOB: 05/12/1960 DOA: 02/08/2019 PCP: Fleet ContrasAvbuere, Edwin, MD   Brief Narrative:  58 year old with history of cirrhosis with splenomegaly, hepatitis C, esophageal varices, adrenal insufficiency, hypothyroidism, schizoaffective disorder, PTSD, CKD stage IIIa initially brought to the hospital for worsening abdominal pain, distention and rectal bleeding. Work-up showed perforated bowel and pneumoperitoneum. General surgery recommended nonoperative management with IV antibiotics and bowel rest. Due to worsening of the pain, repeat CT of the abdomen pelvis showed free intra-abdominal fluid and pneumoperitoneum. There is also perforated bowel near pyloric or stomach. Underwent UGI on 11/16 showing focal contained perforation in distal stomach. Palliative care team is following. Psychiatry evaluate the patient and stated does not have capacity to make his own decisions.  Patient with suspected SBP, on ceftriaxone.  Fluid culture showed Candida, started on fluconazole.     Subjective:   Patient denies any chest pain, any shortness of breath, no abdominal pain today , he was asking if he can eat pizza today.  Assessment & Plan   Severe septic shock secondary to perforated bowel/peritoneum and gastric perforation/ SBP -Upper GI series showed gastric perforation upon admission 02/08/2019 -GI and general surgery consulted and appreciated -Surgical input greatly appreciated, patient extremely frail, with high risk for mortality for surgery, so conservative approach has been recommended by general surgery, palliative approach if possible. -Of note he was on TPN which is now been discontinued, on 03/06/2019.  He is advanced to soft diet currently. -CT abdomen on 03/09/2019: Large volume pneumoperitoneum and ascites decreased from prior study. - Fluid culture results show few candida albicans.  Placed on ceftriaxone empirically and have started fluconazole.(He was  on Zosyn initially during hospital stay) -Patient remains afebrile -Difficult situation to manage, in this patient with decompensated liver cirrhosis and ascites, with perforated bowel/peritoneum and SBP, overall prognosis is very poor given the fact he is not a surgical candidate, and high risk for mortality during his surgery, available option currently is to manage with antibiotic/antifungal.  Palliative care input greatly appreciated, after discussing with friends(decision making surrogates), plan is to continue with current major with no escalation of care, and to enhance patient comfort and quality intervention at this point, he was asking for pizza this morning, so I will go ahead and liberalize his diet, will try to minimize blood draws, and continue with current measures, will DC procedures or intervention which would cause significant amount of discomfort at this point.  Acute metabolic encephalopathy -Suspect multifactorial including sepsis versus uncontrolled underlying psychiatric illness -He is not able to truly understands his current health situation, has poor insight about his medical condition. -Responding well to Zyprexa and Haldol  Decompensated hepatitis C with liver cirrhosis and ascites -Avoid hepatotoxic agents -Paracentesis as needed, will require paracentesis 11/13, 11/18,12/11. -Fluid culture as above shows a few Candida albicans  Acute kidney injury on CKD, stage IIIb -Baseline creatinine approximately 1.7-2 -Nephrology consulted and appreciated -Renal ultrasound suggestive of chronic medical renal disease -Retinae continue to trend up despite holding diuresis, it is 2.7 today, possibly hepatorenal syndrome, already on Midodrin, will give albumin today, holding diuresis.  Hypervolemic hyponatremia -Currently on fluid restriction, unable to give any Lasix or Aldactone given worsening renal function.  Hypotension -Likely secondary to decompensated cirrhosis -BP does  appear to be stable  -Continue midodrine,  decreased Solu-Cortef to once daily hoping to stop soon  Thrombocytopenia -Likely secondary to decompensated hepatitis C/liver cirrhosis -Continue supportive care -continue to monitor   Acute blood  loss anemia/gastric and duodenal ulcer -EGD in October 2020 showed grade D esophagitis with nonbleeding gastric ulcer, duodenal ulcer and hiatal hernia -Continue PPI  Hyperglycemia -Hemoglobin A1c was 3.9 02/09/2019 -Hold off of insulin to avoid hypoglycemia   History of PTSD/schizoaffective disorder -Patient has been seen by psychiatry and deemed not to have capacity to make medical decisions -Family and friends are helping with those decisions  Physical debility/ambulatory dysfunction -PT recommended -Social work consulted and awaiting placement -Continue fall precautions   Goals of care -Given degree of liver cirrhosis with decompensation as well as well perforated bowel with pneumoperitoneum, feel that patient is at high risk for decompensation, as well high risk for mortality, as we will have discussed with general surgery patient mortality at 1 year would be 50% given his liver disease.  Mortality at operation greatly exceeds this.  At this point if patient having cardiac arrest, performing CPR, would not change the outcome, and would not be medically appropriate intervention due to his decompensated liver failure, and his bowel perforation, with very limited functional status, I have discussed with palliative medicine Dr Phillips Odor strongly recommend Limited CODE STATUS/no CPR or intubation, will proceed with 2 physicians DNR, especially with no available family member, I have informed his friend about Two  physicians DNR.  DVT Prophylaxis  SCDs  Code Status: DNR  Family Communication: None at bedside  Disposition Plan: Admitted. When patient is medically stable, will likely need SNF. Difficult placement.   Consultants Palliative  care Interventional radiology Nephrology General surgery Psychiatry  Gastroenterology  Infectious disease via phone by Dr. Natale Milch  Procedures  Ultrasound-guided paracentesis, yielding 14 L clear yellow fluid on 02/25/2019 PICC line placed by IR on 02/21/2019 Echocardiogram  Antibiotics   Anti-infectives (From admission, onward)   Start     Dose/Rate Route Frequency Ordered Stop   03/12/19 1700  fluconazole (DIFLUCAN) tablet 400 mg     400 mg Oral Daily 03/12/19 1654     03/09/19 0800  cefTRIAXone (ROCEPHIN) 2 g in sodium chloride 0.9 % 100 mL IVPB     2 g 200 mL/hr over 30 Minutes Intravenous Every 24 hours 03/09/19 0703     02/08/19 2200  piperacillin-tazobactam (ZOSYN) IVPB 3.375 g  Status:  Discontinued     3.375 g 12.5 mL/hr over 240 Minutes Intravenous Every 8 hours 02/08/19 1552 03/06/19 1638   02/08/19 1545  piperacillin-tazobactam (ZOSYN) IVPB 3.375 g     3.375 g 100 mL/hr over 30 Minutes Intravenous  Once 02/08/19 1533 02/08/19 1840       Objective:   Vitals:   03/16/19 0607 03/16/19 1442 03/16/19 2019 03/17/19 0538  BP: (!) 106/55 99/63 (!) 108/53 116/74  Pulse: 91 89 89 93  Resp: 14  18 18   Temp: (!) 97.5 F (36.4 C) 97.8 F (36.6 C) (!) 97.4 F (36.3 C) (!) 97.5 F (36.4 C)  TempSrc: Oral Oral Oral Oral  SpO2: 97% 97% 98% 98%  Weight:      Height:        Intake/Output Summary (Last 24 hours) at 03/17/2019 1352 Last data filed at 03/17/2019 0600 Gross per 24 hour  Intake 1420 ml  Output 125 ml  Net 1295 ml   Filed Weights   03/07/19 0500 03/08/19 0500 03/10/19 0439  Weight: 131.5 kg 132 kg 118.3 kg   Exam  Obese male, laying in bed, in no apparent distress, currently ill-appearing, awake alert x1, impaired judgment and insight  Good air entry bilaterally, no wheezing  Regular rate and rhythm, no rubs murmurs gallops Demented obese, nontender, ascites Extremities +3 edema, no clubbing or cyanosis.  Data Reviewed: I have personally  reviewed following labs and imaging studies  CBC: Recent Labs  Lab 03/12/19 0828 03/13/19 0427 03/14/19 0516 03/15/19 0345 03/16/19 0420  WBC 23.7* 29.1* 24.4* 29.5* 29.2*  HGB 8.8* 9.1* 8.6* 9.2* 9.0*  HCT 27.9* 28.3* 26.8* 29.0* 28.2*  MCV 95.5 94.6 94.7 95.4 94.6  PLT 78* 106* 92* 114* 283*   Basic Metabolic Panel: Recent Labs  Lab 03/12/19 0828 03/13/19 0427 03/14/19 0516 03/15/19 0345 03/16/19 0420  NA 126* 126* 128* 124* 126*  K 5.1 5.0 5.3* 4.9 4.9  CL 95* 95* 95* 92* 95*  CO2 22 20* 22 22 22   GLUCOSE 99 68* 97 101* 98  BUN 107* 109* 108* 109* 112*  CREATININE 2.24* 2.29* 2.38* 2.57* 2.72*  CALCIUM 11.4* 11.4* 11.4* 11.2* 11.4*   GFR: Estimated Creatinine Clearance: 42.8 mL/min (A) (by C-G formula based on SCr of 2.72 mg/dL (H)). Liver Function Tests: Recent Labs  Lab 03/14/19 0516 03/15/19 0345 03/16/19 0420  AST 28 28 27   ALT 25 27 28   ALKPHOS 111 132* 127*  BILITOT 0.5 0.7 0.4  PROT 5.3* 5.5* 5.1*  ALBUMIN 1.5* 1.5* 1.4*   No results for input(s): LIPASE, AMYLASE in the last 168 hours. No results for input(s): AMMONIA in the last 168 hours. Coagulation Profile: No results for input(s): INR, PROTIME in the last 168 hours. Cardiac Enzymes: No results for input(s): CKTOTAL, CKMB, CKMBINDEX, TROPONINI in the last 168 hours. BNP (last 3 results) No results for input(s): PROBNP in the last 8760 hours. HbA1C: No results for input(s): HGBA1C in the last 72 hours. CBG: Recent Labs  Lab 03/16/19 1155 03/16/19 1646 03/16/19 2020 03/17/19 0716 03/17/19 1229  GLUCAP 88 105* 108* 131* 180*   Lipid Profile: No results for input(s): CHOL, HDL, LDLCALC, TRIG, CHOLHDL, LDLDIRECT in the last 72 hours. Thyroid Function Tests: No results for input(s): TSH, T4TOTAL, FREET4, T3FREE, THYROIDAB in the last 72 hours. Anemia Panel: No results for input(s): VITAMINB12, FOLATE, FERRITIN, TIBC, IRON, RETICCTPCT in the last 72 hours. Urine analysis:    Component  Value Date/Time   COLORURINE YELLOW 02/18/2019 1839   APPEARANCEUR HAZY (A) 02/18/2019 1839   LABSPEC 1.019 02/18/2019 1839   PHURINE 5.0 02/18/2019 1839   GLUCOSEU NEGATIVE 02/18/2019 1839   HGBUR NEGATIVE 02/18/2019 1839   BILIRUBINUR NEGATIVE 02/18/2019 1839   KETONESUR NEGATIVE 02/18/2019 1839   PROTEINUR NEGATIVE 02/18/2019 1839   UROBILINOGEN 1.0 01/19/2013 0015   NITRITE NEGATIVE 02/18/2019 1839   LEUKOCYTESUR NEGATIVE 02/18/2019 1839   Sepsis Labs: @LABRCNTIP (procalcitonin:4,lacticidven:4)  ) Recent Results (from the past 240 hour(s))  Body fluid culture     Status: None   Collection Time: 03/09/19  2:45 PM   Specimen: Abdomen; Peritoneal Fluid  Result Value Ref Range Status   Specimen Description PERITONEAL  Final   Special Requests NONE  Final   Gram Stain   Final    ABUNDANT WBC PRESENT,BOTH PMN AND MONONUCLEAR NO ORGANISMS SEEN    Culture   Final    FEW CANDIDA ALBICANS CRITICAL RESULT CALLED TO, READ BACK BY AND VERIFIED WITH: RN N HARVY [id number] AT 856 AM BY CM Performed at [facility], [place] [address] [place], [place] [address]    Report Status 03/12/2019 FINAL  Final      Radiology Studies: No results found.   Scheduled Meds: . benztropine  mesylate  0.5 mg Intramuscular Daily  . Chlorhexidine Gluconate Cloth  6 each Topical Daily  . fluconazole  400 mg Oral Daily  . gabapentin  100 mg Oral QHS  . haloperidol  2 mg Oral Daily  . midodrine  10 mg Oral BID WC  . Muscle Rub   Topical BID  . OLANZapine zydis  5 mg Oral QHS  . pantoprazole  40 mg Oral BID  . sodium chloride flush  10-40 mL Intracatheter Q12H   Continuous Infusions: . sodium chloride    . sodium chloride Stopped (02/12/19 1737)  . sodium chloride Stopped (02/19/19 1422)  . cefTRIAXone (ROCEPHIN)  IV 2 g (03/16/19 0801)     LOS: 37 days   Huey Bienenstockawood Arma Reining M.D . on 03/17/2019 at 1:52 PM  Between 7am to 7pm - Please see pager noted on [email or web address]  After 7pm go to  [email or web address]  And look for the night coverage person covering for me after hours  [facility] Office  [phone]

## 2019-03-18 LAB — CBC
HCT: 24.3 % — ABNORMAL LOW (ref 39.0–52.0)
Hemoglobin: 7.8 g/dL — ABNORMAL LOW (ref 13.0–17.0)
MCH: 30.6 pg (ref 26.0–34.0)
MCHC: 32.1 g/dL (ref 30.0–36.0)
MCV: 95.3 fL (ref 80.0–100.0)
Platelets: DECREASED 10*3/uL (ref 150–400)
RBC: 2.55 MIL/uL — ABNORMAL LOW (ref 4.22–5.81)
RDW: 18.7 % — ABNORMAL HIGH (ref 11.5–15.5)
WBC: 24.8 10*3/uL — ABNORMAL HIGH (ref 4.0–10.5)
nRBC: 0 % (ref 0.0–0.2)

## 2019-03-18 LAB — COMPREHENSIVE METABOLIC PANEL
ALT: 23 U/L (ref 0–44)
AST: 26 U/L (ref 15–41)
Albumin: 1.9 g/dL — ABNORMAL LOW (ref 3.5–5.0)
Alkaline Phosphatase: 108 U/L (ref 38–126)
Anion gap: 10 (ref 5–15)
BUN: 118 mg/dL — ABNORMAL HIGH (ref 6–20)
CO2: 21 mmol/L — ABNORMAL LOW (ref 22–32)
Calcium: 11.4 mg/dL — ABNORMAL HIGH (ref 8.9–10.3)
Chloride: 96 mmol/L — ABNORMAL LOW (ref 98–111)
Creatinine, Ser: 2.82 mg/dL — ABNORMAL HIGH (ref 0.61–1.24)
GFR calc Af Amer: 27 mL/min — ABNORMAL LOW (ref >60)
GFR calc non Af Amer: 24 mL/min — ABNORMAL LOW (ref >60)
Glucose, Bld: 124 mg/dL — ABNORMAL HIGH (ref 70–99)
Potassium: 4.3 mmol/L (ref 3.5–5.1)
Sodium: 127 mmol/L — ABNORMAL LOW (ref 135–145)
Total Bilirubin: 0.3 mg/dL (ref 0.3–1.2)
Total Protein: 5 g/dL — ABNORMAL LOW (ref 6.5–8.1)

## 2019-03-18 LAB — GLUCOSE, CAPILLARY
Glucose-Capillary: 114 mg/dL — ABNORMAL HIGH (ref 70–99)
Glucose-Capillary: 113 mg/dL — ABNORMAL HIGH (ref 70–99)
Glucose-Capillary: 135 mg/dL — ABNORMAL HIGH (ref 70–99)
Glucose-Capillary: 114 mg/dL — ABNORMAL HIGH (ref 70–99)

## 2019-03-18 MED ORDER — HYDROMORPHONE HCL 1 MG/ML IJ SOLN
1.0000 mg | INTRAMUSCULAR | Status: DC | PRN
  Administered 2019-03-18 – 2019-03-23 (×18): 1 mg via INTRAVENOUS
  Filled 2019-03-18 (×18): qty 1

## 2019-03-18 MED ORDER — MIDODRINE HCL 2.5 MG PO TABS
5.0000 mg | ORAL_TABLET | Freq: Two times a day (BID) | ORAL | Status: DC
  Administered 2019-03-19 – 2019-03-23 (×8): 5 mg via ORAL
  Filled 2019-03-18 (×11): qty 2

## 2019-03-18 NOTE — Progress Notes (Signed)
PROGRESS NOTE    [name]  JXB:[id number] DOB: 01/09/1961 DOA: 02/08/2019 PCP: Fleet ContrasAvbuere, Edwin, MD   Brief Narrative:  58 year old with history of cirrhosis with splenomegaly, hepatitis C, esophageal varices, adrenal insufficiency, hypothyroidism, schizoaffective disorder, PTSD, CKD stage IIIa initially brought to the hospital for worsening abdominal pain, distention and rectal bleeding. Work-up showed perforated bowel and pneumoperitoneum. General surgery recommended nonoperative management with IV antibiotics and bowel rest. Due to worsening of the pain, repeat CT of the abdomen pelvis showed free intra-abdominal fluid and pneumoperitoneum. There is also perforated bowel near pyloric or stomach. Underwent UGI on 11/16 showing focal contained perforation in distal stomach. Palliative care team is following. Psychiatry evaluate the patient and stated does not have capacity to make his own decisions.  Patient with suspected SBP, on ceftriaxone.  Fluid culture showed Candida, started on fluconazole.     Subjective:   Patient is less conversant today, overall he denies any complaints, unreliable historian , no significant events as discussed with staff .  Assessment & Plan   Severe septic shock secondary to perforated bowel/peritoneum and gastric perforation/ SBP -Upper GI series showed gastric perforation upon admission 02/08/2019. -GI and general surgery consulted and appreciated -Surgical input greatly appreciated, patient extremely frail, with high risk for mortality for surgery, so conservative approach has been recommended by general surgery, palliative approach if possible. -Of note he was on TPN which is now been discontinued, on 03/06/2019.  He is advanced to soft diet currently. -CT abdomen on 03/09/2019: Large volume pneumoperitoneum and ascites decreased from prior study. - Fluid culture results show few candida albicans.  Placed on ceftriaxone empirically and have started  fluconazole.(He was on Zosyn initially during hospital stay) -Patient remains afebrile -Difficult situation to manage, in this patient with decompensated liver cirrhosis and ascites, with perforated bowel/peritoneum and SBP, overall prognosis is very poor given the fact he is not a surgical candidate, and high risk for mortality during his surgery, available option currently is to manage with antibiotic/antifungal.  Palliative care input greatly appreciated, after discussing with friends(decision making surrogates), plan is to continue with current major with no escalation of care, and to enhance patient comfort and quality intervention at this point, he was asking for pizza this morning, so I will go ahead and liberalize his diet, will try to minimize blood draws, and continue with current measures, will DC procedures or intervention which would cause significant amount of discomfort at this point.  Acute metabolic encephalopathy -Suspect multifactorial including sepsis versus uncontrolled underlying psychiatric illness -He is not able to truly understands his current health situation, has poor insight about his medical condition. -Responding well to Zyprexa and Haldol -Appears to be more lethargic today, will check ammonia level  Decompensated hepatitis C with liver cirrhosis and ascites -Avoid hepatotoxic agents -Paracentesis as needed, will require paracentesis 11/13, 11/18,12/11. -Fluid culture as above shows a few Candida albicans  Acute kidney injury on CKD, stage IIIb -Baseline creatinine approximately 1.7-2 -Nephrology consulted and appreciated -Renal ultrasound suggestive of chronic medical renal disease -Retinae continue to trend up despite holding diuresis, it is 2.7 today, possibly hepatorenal syndrome, already on Midodrin, will give albumin today, holding diuresis.  Hypervolemic hyponatremia -Currently on fluid restriction, unable to give any Lasix or Aldactone given worsening  renal function.  Hypotension -Likely secondary to decompensated cirrhosis -BP does appear to be stable  -Hypotension has improved, have stopped Solu-Cortef -will decrease midodrine dose to 5 mg twice daily  Thrombocytopenia -Likely secondary to decompensated hepatitis C/liver  cirrhosis -Continue supportive care -continue to monitor   Acute blood loss anemia/gastric and duodenal ulcer -EGD in October 2020 showed grade D esophagitis with nonbleeding gastric ulcer, duodenal ulcer and hiatal hernia -Continue PPI  Hyperglycemia -Hemoglobin A1c was 3.9 02/09/2019 -Hold off of insulin to avoid hypoglycemia   History of PTSD/schizoaffective disorder -Patient has been seen by psychiatry and deemed not to have capacity to make medical decisions -Family and friends are helping with those decisions  Physical debility/ambulatory dysfunction -PT recommended -Social work consulted and awaiting placement -Continue fall precautions   Goals of care -Given degree of liver cirrhosis with decompensation as well as well perforated bowel with pneumoperitoneum, feel that patient is at high risk for decompensation, as well high risk for mortality, as we will have discussed with general surgery patient mortality at 1 year would be 50% given his liver disease.  Mortality at operation greatly exceeds this.  At this point if patient having cardiac arrest, performing CPR, would not change the outcome, and would not be medically appropriate intervention due to his decompensated liver failure, and his bowel perforation, with very limited functional status, I have discussed with palliative medicine Dr Phillips Odor strongly recommend Limited CODE STATUS/no CPR or intubation, will proceed with 2 physicians DNR, especially with no available family member, I have informed his friend about Two  physicians DNR.  DVT Prophylaxis  SCDs  Code Status: DNR  Family Communication: None at bedside  Disposition Plan: Admitted.  When patient is medically stable, will likely need SNF. Difficult placement.   Consultants Palliative care Interventional radiology Nephrology General surgery Psychiatry  Gastroenterology  Infectious disease via phone by Dr. Natale Milch  Procedures  Ultrasound-guided paracentesis, yielding 14 L clear yellow fluid on 02/25/2019 PICC line placed by IR on 02/21/2019 Echocardiogram  Antibiotics   Anti-infectives (From admission, onward)   Start     Dose/Rate Route Frequency Ordered Stop   03/12/19 1700  fluconazole (DIFLUCAN) tablet 400 mg     400 mg Oral Daily 03/12/19 1654     03/09/19 0800  cefTRIAXone (ROCEPHIN) 2 g in sodium chloride 0.9 % 100 mL IVPB     2 g 200 mL/hr over 30 Minutes Intravenous Every 24 hours 03/09/19 0703     02/08/19 2200  piperacillin-tazobactam (ZOSYN) IVPB 3.375 g  Status:  Discontinued     3.375 g 12.5 mL/hr over 240 Minutes Intravenous Every 8 hours 02/08/19 1552 03/06/19 1638   02/08/19 1545  piperacillin-tazobactam (ZOSYN) IVPB 3.375 g     3.375 g 100 mL/hr over 30 Minutes Intravenous  Once 02/08/19 1533 02/08/19 1840       Objective:   Vitals:   03/17/19 0538 03/17/19 1443 03/17/19 2348 03/18/19 0546  BP: 116/74 113/65 (!) 110/59 106/61  Pulse: 93 91 93 (!) 101  Resp: 18  16 16   Temp: (!) 97.5 F (36.4 C) 97.6 F (36.4 C) 97.6 F (36.4 C) 98 F (36.7 C)  TempSrc: Oral  Oral Oral  SpO2: 98% 100% 96% 98%  Weight:      Height:        Intake/Output Summary (Last 24 hours) at 03/18/2019 1404 Last data filed at 03/18/2019 0610 Gross per 24 hour  Intake 364 ml  Output 550 ml  Net -186 ml   Filed Weights   03/07/19 0500 03/08/19 0500 03/10/19 0439  Weight: 131.5 kg 132 kg 118.3 kg   Exam  Patient is more lethargic today, but he wakes up, less communicative today, chronically ill-appearing, impaired judgment  and insight. With air entry bilaterally, no wheezing. Regular rate and rhythm, no rubs murmurs gallops Demented obese,  nontender, ascites +3 edema bilaterally  Data Reviewed: I have personally reviewed following labs and imaging studies  CBC: Recent Labs  Lab 03/13/19 0427 03/14/19 0516 03/15/19 0345 03/16/19 0420 03/18/19 0817  WBC 29.1* 24.4* 29.5* 29.2* 24.8*  HGB 9.1* 8.6* 9.2* 9.0* 7.8*  HCT 28.3* 26.8* 29.0* 28.2* 24.3*  MCV 94.6 94.7 95.4 94.6 95.3  PLT 106* 92* 114* 123* PLATELET CLUMPS NOTED ON SMEAR, COUNT APPEARS DECREASED   Basic Metabolic Panel: Recent Labs  Lab 03/13/19 0427 03/14/19 0516 03/15/19 0345 03/16/19 0420 03/18/19 0817  NA 126* 128* 124* 126* 127*  K 5.0 5.3* 4.9 4.9 4.3  CL 95* 95* 92* 95* 96*  CO2 20* 22 22 22  21*  GLUCOSE 68* 97 101* 98 124*  BUN 109* 108* 109* 112* 118*  CREATININE 2.29* 2.38* 2.57* 2.72* 2.82*  CALCIUM 11.4* 11.4* 11.2* 11.4* 11.4*   GFR: Estimated Creatinine Clearance: 41.3 mL/min (A) (by C-G formula based on SCr of 2.82 mg/dL (H)). Liver Function Tests: Recent Labs  Lab 03/14/19 0516 03/15/19 0345 03/16/19 0420 03/18/19 0817  AST 28 28 27 26   ALT 25 27 28 23   ALKPHOS 111 132* 127* 108  BILITOT 0.5 0.7 0.4 0.3  PROT 5.3* 5.5* 5.1* 5.0*  ALBUMIN 1.5* 1.5* 1.4* 1.9*   No results for input(s): LIPASE, AMYLASE in the last 168 hours. No results for input(s): AMMONIA in the last 168 hours. Coagulation Profile: No results for input(s): INR, PROTIME in the last 168 hours. Cardiac Enzymes: No results for input(s): CKTOTAL, CKMB, CKMBINDEX, TROPONINI in the last 168 hours. BNP (last 3 results) No results for input(s): PROBNP in the last 8760 hours. HbA1C: No results for input(s): HGBA1C in the last 72 hours. CBG: Recent Labs  Lab 03/17/19 1229 03/17/19 1710 03/17/19 2345 03/18/19 0739 03/18/19 1156  GLUCAP 180* 199* 94 114* 113*   Lipid Profile: No results for input(s): CHOL, HDL, LDLCALC, TRIG, CHOLHDL, LDLDIRECT in the last 72 hours. Thyroid Function Tests: No results for input(s): TSH, T4TOTAL, FREET4, T3FREE,  THYROIDAB in the last 72 hours. Anemia Panel: No results for input(s): VITAMINB12, FOLATE, FERRITIN, TIBC, IRON, RETICCTPCT in the last 72 hours. Urine analysis:    Component Value Date/Time   COLORURINE YELLOW 02/18/2019 1839   APPEARANCEUR HAZY (A) 02/18/2019 1839   LABSPEC 1.019 02/18/2019 1839   PHURINE 5.0 02/18/2019 1839   GLUCOSEU NEGATIVE 02/18/2019 1839   HGBUR NEGATIVE 02/18/2019 1839   BILIRUBINUR NEGATIVE 02/18/2019 1839   KETONESUR NEGATIVE 02/18/2019 1839   PROTEINUR NEGATIVE 02/18/2019 1839   UROBILINOGEN 1.0 01/19/2013 0015   NITRITE NEGATIVE 02/18/2019 1839   LEUKOCYTESUR NEGATIVE 02/18/2019 1839   Sepsis Labs: @LABRCNTIP (procalcitonin:4,lacticidven:4)  ) Recent Results (from the past 240 hour(s))  Body fluid culture     Status: None   Collection Time: 03/09/19  2:45 PM   Specimen: Abdomen; Peritoneal Fluid  Result Value Ref Range Status   Specimen Description PERITONEAL  Final   Special Requests NONE  Final   Gram Stain   Final    ABUNDANT WBC PRESENT,BOTH PMN AND MONONUCLEAR NO ORGANISMS SEEN    Culture   Final    FEW CANDIDA ALBICANS CRITICAL RESULT CALLED TO, READ BACK BY AND VERIFIED WITH: RN N HARVY [id number] AT 856 AM BY CM Performed at [facility], [address] [place], [place] [address]    Report Status 03/12/2019  FINAL  Final      Radiology Studies: No results found.   Scheduled Meds: . benztropine mesylate  0.5 mg Intramuscular Daily  . Chlorhexidine Gluconate Cloth  6 each Topical Daily  . fluconazole  400 mg Oral Daily  . gabapentin  100 mg Oral QHS  . haloperidol  2 mg Oral Daily  . midodrine  5 mg Oral BID WC  . Muscle Rub   Topical BID  . OLANZapine zydis  5 mg Oral QHS  . pantoprazole  40 mg Oral BID  . sodium chloride flush  10-40 mL Intracatheter Q12H   Continuous Infusions: . sodium chloride    . sodium chloride Stopped (02/12/19 1737)  . sodium chloride Stopped (02/19/19 1422)  . cefTRIAXone (ROCEPHIN)  IV 2 g  (03/18/19 0914)     LOS: 51 days   Phillips Climes M.D . on 03/18/2019 at 2:04 PM  Between 7am to 7pm - Please see pager noted on [email or web address]  After 7pm go to [email or web address]  And look for the night coverage person covering for me after hours  [facility] Office  [phone]

## 2019-03-18 NOTE — Progress Notes (Addendum)
Palliative follow up.  Went to bedside.  Patient is being bathed and cleaned up.  Spoke with bedside RN.  Both of [place] professed a fondness for [employer]. She requested a foley as he has required multiple clean ups which are very uncomfortable for him.  He is no longer getting out of bed.  He refused all of his medications today.  His mental status seems to wax and wane.   He takes great joy in being able to pick out his food and then eat it.  (His favorite is pizza).  We discussed our focus is Tristyn's comfort and happiness.  His friends understand that he will decline (is declining) and when he reaches a certain point he will be [facility] eligible.   They have indicated that when he is eligible for hospice they want him transferred there.  Assessment.  Awake but altered mental status.   ?Uremia?  Ammonia level is normal, wbc improved.  Frequent episodes of diarrhea with stage 2 sacral wound.  Recommendation.  Would continue to focus on interventions that increase his comfort and happiness.  Please avoid aggressive care that will not benefit him in the long term.    PMT will continue to touch base intermittently.   [name], PA-C Palliative Medicine Office:  [phone]  15 min.

## 2019-03-18 NOTE — Progress Notes (Signed)
Stage 2 pressure injury noted today on right and left buttocks.  Wound beds are red without any slough.  Cleansed area and applied incontinence barrier.  Foley inserted for comfort and to keep pt dry.   On the left side of the abd, pt has 2 tiny holes which drain fluid which is serous.  I placed a Holliser [phone] pouch on the area so the drainage can drain into the pouch and will not remain on the skin.

## 2019-03-18 NOTE — Progress Notes (Signed)
Pt states he does not want any of his meds this am, wants to rest at present.

## 2019-03-19 LAB — GLUCOSE, CAPILLARY
Glucose-Capillary: 97 mg/dL (ref 70–99)
Glucose-Capillary: 113 mg/dL — ABNORMAL HIGH (ref 70–99)
Glucose-Capillary: 124 mg/dL — ABNORMAL HIGH (ref 70–99)
Glucose-Capillary: 130 mg/dL — ABNORMAL HIGH (ref 70–99)

## 2019-03-19 NOTE — Progress Notes (Signed)
PROGRESS NOTE    [name]  ZOX:[id number] DOB: 01/01/1961 DOA: 02/08/2019 PCP: Fleet ContrasAvbuere, Edwin, MD   Brief Narrative:  58 year old with history of cirrhosis with splenomegaly, hepatitis C, esophageal varices, adrenal insufficiency, hypothyroidism, schizoaffective disorder, PTSD, CKD stage IIIa initially brought to the hospital for worsening abdominal pain, distention and rectal bleeding. Work-up showed perforated bowel and pneumoperitoneum. General surgery recommended nonoperative management with IV antibiotics and bowel rest. Due to worsening of the pain, repeat CT of the abdomen pelvis showed free intra-abdominal fluid and pneumoperitoneum. There is also perforated bowel near pyloric or stomach. Underwent UGI on 11/16 showing focal contained perforation in distal stomach. Palliative care team is following. Psychiatry evaluate the patient and stated does not have capacity to make his own decisions.  Patient has no family here, but he has few friends which has been helping with making medical decisions, [place] has been meeting with him frequently, discussing goals of care, patient comfort and happiness is the focus of care currently, with no escalation of care, and avoiding procedures will cause pain without significant change in outcome.   Subjective:   Patient remains confused today, he denies any complaints currently, reports he wants to eat pizza .  Assessment & Plan   Severe septic shock secondary to perforated bowel/peritoneum and gastric perforation/ SBP -Upper GI series showed gastric perforation upon admission 02/08/2019. -GI and general surgery consulted and appreciated -Surgical input greatly appreciated, patient extremely frail, with high risk for mortality for surgery, so conservative approach has been recommended by general surgery, palliative approach if possible. -Of note he was on TPN which is now been discontinued, on 03/06/2019.  He is advanced to soft diet  currently. -CT abdomen on 03/09/2019: Large volume pneumoperitoneum and ascites decreased from prior study. - Fluid culture results show few candida albicans.  Placed on ceftriaxone empirically and have started fluconazole.(He was on Zosyn initially during hospital stay) -Patient remains afebrile -Difficult situation to manage, in this patient with decompensated liver cirrhosis and ascites, with perforated bowel/peritoneum and SBP, overall prognosis is very poor given the fact he is not a surgical candidate, and high risk for mortality during his surgery, available option currently is to manage with antibiotic/antifungal.  - Palliative care input greatly appreciated, after discussing with friends(decision making surrogates), plan is to continue with current major with no escalation of care, and to enhance patient comfort and quality intervention at this point, so I have minimized his blood work, liberalized his diet.  Acute metabolic encephalopathy -Suspect multifactorial including sepsis versus uncontrolled underlying psychiatric illness -He is not able to truly understands his current health situation, has poor insight about his medical condition. -He is more lethargic and altered recently, most likely related to uremia given significant increase in his BUN, with worsening renal failure,.  Decompensated hepatitis C with liver cirrhosis and ascites -Avoid hepatotoxic agents -Paracentesis as needed, will require paracentesis 11/13, 11/18,12/11. -Fluid culture as above shows a few Candida albicans  Acute kidney injury on CKD, stage IIIb -Baseline creatinine approximately 1.7-2 -Nephrology consulted and appreciated -Renal ultrasound suggestive of chronic medical renal disease -Continues to have worsening renal function, possibly hepatorenal , even though with no diuresis, and volume overload creatinine continue to increase, he is treated with midodrine, he received albumin with poor response  .  Hypervolemic hyponatremia -Currently on fluid restriction, unable to give any Lasix or Aldactone given worsening renal function.  Hypotension -Likely secondary to decompensated cirrhosis -BP does appear to be stable  -Hypotension has improved, have stopped  Solu-Cortef -will decrease midodrine dose to 5 mg twice daily  Thrombocytopenia -Likely secondary to decompensated hepatitis C/liver cirrhosis -Continue supportive care -continue to monitor   Acute blood loss anemia/gastric and duodenal ulcer -EGD in October 2020 showed grade D esophagitis with nonbleeding gastric ulcer, duodenal ulcer and hiatal hernia -Continue PPI  Hyperglycemia -Hemoglobin A1c was 3.9 02/09/2019 -Hold off of insulin to avoid hypoglycemia   History of PTSD/schizoaffective disorder -Patient has been seen by psychiatry and deemed not to have capacity to make medical decisions -Family and friends are helping with those decisions  Physical debility/ambulatory dysfunction -PT recommended -Social work consulted and awaiting placement -Continue fall precautions   Goals of care -Given degree of liver cirrhosis with decompensation as well as well perforated bowel with pneumoperitoneum, feel that patient is at high risk for decompensation, as well high risk for mortality, as we will have discussed with general surgery patient mortality at 1 year would be 50% given his liver disease.  Mortality at operation greatly exceeds this.  Patient currently has 2 physicians DNR. -Palliative consult greatly appreciated, continue with current medical management, but no escalation of care, and will focus on measures which increases happiness and comfort, do anticipate this patient will continue to decline, and once this happened, he will be appropriate for hospice placement.  DVT Prophylaxis  SCDs  Code Status: DNR  Family Communication: None at bedside  Disposition Plan: Admitted. When patient is medically stable, will  likely need SNF. Difficult placement.   Consultants Palliative care Interventional radiology Nephrology General surgery Psychiatry  Gastroenterology  Infectious disease via phone by Dr. Natale MilchLancaster  Procedures  Ultrasound-guided paracentesis, yielding 14 L clear yellow fluid on 02/25/2019 PICC line placed by IR on 02/21/2019 Echocardiogram  Antibiotics   Anti-infectives (From admission, onward)   Start     Dose/Rate Route Frequency Ordered Stop   03/12/19 1700  fluconazole (DIFLUCAN) tablet 400 mg     400 mg Oral Daily 03/12/19 1654     03/09/19 0800  cefTRIAXone (ROCEPHIN) 2 g in sodium chloride 0.9 % 100 mL IVPB     2 g 200 mL/hr over 30 Minutes Intravenous Every 24 hours 03/09/19 0703     02/08/19 2200  piperacillin-tazobactam (ZOSYN) IVPB 3.375 g  Status:  Discontinued     3.375 g 12.5 mL/hr over 240 Minutes Intravenous Every 8 hours 02/08/19 1552 03/06/19 1638   02/08/19 1545  piperacillin-tazobactam (ZOSYN) IVPB 3.375 g     3.375 g 100 mL/hr over 30 Minutes Intravenous  Once 02/08/19 1533 02/08/19 1840       Objective:   Vitals:   03/18/19 0546 03/18/19 1425 03/18/19 2134 03/19/19 0556  BP: 106/61 (!) 83/59 115/67 (!) 101/56  Pulse: (!) 101 95 98 96  Resp: 16  16 14   Temp: 98 F (36.7 C) 98.3 F (36.8 C) 98.4 F (36.9 C) 97.6 F (36.4 C)  TempSrc: Oral Oral Oral Oral  SpO2: 98% 97% 98% 98%  Weight:    (!) 163.3 kg  Height:        Intake/Output Summary (Last 24 hours) at 03/19/2019 1521 Last data filed at 03/18/2019 2219 Gross per 24 hour  Intake --  Output 100 ml  Net -100 ml   Filed Weights   03/08/19 0500 03/10/19 0439 03/19/19 0556  Weight: 132 kg 118.3 kg (!) 163.3 kg   Exam  Patient laying comfortably in bed, in no apparent distress, he is awake, but confused, with poor cognition and insight  Diminished air  entry bilaterally at the bases, no wheezing  Regular rhythm, no murmur  Obese abdomen, nontender, bowel sounds present Edema +1  bilaterally.  Data Reviewed: I have personally reviewed following labs and imaging studies  CBC: Recent Labs  Lab 03/13/19 0427 03/14/19 0516 03/15/19 0345 03/16/19 0420 03/18/19 0817  WBC 29.1* 24.4* 29.5* 29.2* 24.8*  HGB 9.1* 8.6* 9.2* 9.0* 7.8*  HCT 28.3* 26.8* 29.0* 28.2* 24.3*  MCV 94.6 94.7 95.4 94.6 95.3  PLT 106* 92* 114* 123* PLATELET CLUMPS NOTED ON SMEAR, COUNT APPEARS DECREASED   Basic Metabolic Panel: Recent Labs  Lab 03/13/19 0427 03/14/19 0516 03/15/19 0345 03/16/19 0420 03/18/19 0817  NA 126* 128* 124* 126* 127*  K 5.0 5.3* 4.9 4.9 4.3  CL 95* 95* 92* 95* 96*  CO2 20* 22 22 22  21*  GLUCOSE 68* 97 101* 98 124*  BUN 109* 108* 109* 112* 118*  CREATININE 2.29* 2.38* 2.57* 2.72* 2.82*  CALCIUM 11.4* 11.4* 11.2* 11.4* 11.4*   GFR: Estimated Creatinine Clearance: 48.5 mL/min (A) (by C-G formula based on SCr of 2.82 mg/dL (H)). Liver Function Tests: Recent Labs  Lab 03/14/19 0516 03/15/19 0345 03/16/19 0420 03/18/19 0817  AST 28 28 27 26   ALT 25 27 28 23   ALKPHOS 111 132* 127* 108  BILITOT 0.5 0.7 0.4 0.3  PROT 5.3* 5.5* 5.1* 5.0*  ALBUMIN 1.5* 1.5* 1.4* 1.9*   No results for input(s): LIPASE, AMYLASE in the last 168 hours. No results for input(s): AMMONIA in the last 168 hours. Coagulation Profile: No results for input(s): INR, PROTIME in the last 168 hours. Cardiac Enzymes: No results for input(s): CKTOTAL, CKMB, CKMBINDEX, TROPONINI in the last 168 hours. BNP (last 3 results) No results for input(s): PROBNP in the last 8760 hours. HbA1C: No results for input(s): HGBA1C in the last 72 hours. CBG: Recent Labs  Lab 03/18/19 1156 03/18/19 1601 03/18/19 2138 03/19/19 0759 03/19/19 1242  GLUCAP 113* 135* 114* 97 113*   Lipid Profile: No results for input(s): CHOL, HDL, LDLCALC, TRIG, CHOLHDL, LDLDIRECT in the last 72 hours. Thyroid Function Tests: No results for input(s): TSH, T4TOTAL, FREET4, T3FREE, THYROIDAB in the last 72  hours. Anemia Panel: No results for input(s): VITAMINB12, FOLATE, FERRITIN, TIBC, IRON, RETICCTPCT in the last 72 hours. Urine analysis:    Component Value Date/Time   COLORURINE YELLOW 02/18/2019 1839   APPEARANCEUR HAZY (A) 02/18/2019 1839   LABSPEC 1.019 02/18/2019 1839   PHURINE 5.0 02/18/2019 1839   GLUCOSEU NEGATIVE 02/18/2019 1839   HGBUR NEGATIVE 02/18/2019 1839   BILIRUBINUR NEGATIVE 02/18/2019 1839   KETONESUR NEGATIVE 02/18/2019 1839   PROTEINUR NEGATIVE 02/18/2019 1839   UROBILINOGEN 1.0 01/19/2013 0015   NITRITE NEGATIVE 02/18/2019 1839   LEUKOCYTESUR NEGATIVE 02/18/2019 1839   Sepsis Labs: @LABRCNTIP (procalcitonin:4,lacticidven:4)  ) No results found for this or any previous visit (from the past 240 hour(s)).    Radiology Studies: No results found.   Scheduled Meds: . benztropine mesylate  0.5 mg Intramuscular Daily  . Chlorhexidine Gluconate Cloth  6 each Topical Daily  . fluconazole  400 mg Oral Daily  . gabapentin  100 mg Oral QHS  . haloperidol  2 mg Oral Daily  . midodrine  5 mg Oral BID WC  . Muscle Rub   Topical BID  . OLANZapine zydis  5 mg Oral QHS  . pantoprazole  40 mg Oral BID  . sodium chloride flush  10-40 mL Intracatheter Q12H   Continuous Infusions: . sodium chloride    .  sodium chloride Stopped (02/12/19 1737)  . sodium chloride Stopped (02/19/19 1422)  . cefTRIAXone (ROCEPHIN)  IV 2 g (03/19/19 0856)     LOS: 34 days   Phillips Climes M.D . on 03/19/2019 at 3:21 PM  Between 7am to 7pm - Please see pager noted on [email or web address]  After 7pm go to [email or web address]  And look for the night coverage person covering for me after hours  [facility] Office  [phone]

## 2019-03-19 NOTE — Progress Notes (Signed)
Nutrition Brief Note  RD working remotely. Chart reviewed. Per palliative care notes, pt now receiving comfort based care with no escalation of care. Prognosis is days to weeks and will likely be eligible for residential hospice soon. Diet was liberalized yesterday by MD to increase food selections (pt wanted to eat pizza yesterday). No further nutrition interventions warranted at this time.  Please re-consult as needed.   Ayzia Day A. Jimmye Norman, RD, LDN, [place] Registered Dietitian II Certified Diabetes Care and Education Specialist Pager: [phone] After hours Pager: [phone]  No further nutrition interventions warranted at this time.  Please re-consult as needed.

## 2019-03-19 NOTE — Progress Notes (Signed)
Occupational Therapy Treatment Patient Details Name: [name] MRN: [id number] DOB: Oct 07, 1960 Today's Date: 03/19/2019    History of present illness Pt is a 58 y/o male admitted secondary to abdominal pain, distention and rectal bleeding. Pt found to have a bowel perforation. Per surgical team, no surgery indicated as pt is a "poor surgical candidate". Paracentesis completed on 11/18 with 5L removed. PMH including but not limited to cirrhosis, hep C, schizoaffective disorder, PTSD and CKD.   OT comments  Pt resistive to and refusing supine - sit for seated EOB activities. Total A for repositioning in bed. Pt continuously saying, "move both my legs" and "get me coffee and sprite". Pt agreeable to rolling in bed for hygiene and postioning and B UE AROM exercises. OT following acutely  Follow Up Recommendations  SNF;Supervision/Assistance - 24 hour    Equipment Recommendations  None recommended by OT    Recommendations for Other Services      Precautions / Restrictions Precautions Precautions: Fall Precaution Comments: at risk for skin breakdown Restrictions Weight Bearing Restrictions: No       Mobility Bed Mobility Overal bed mobility: Needs Assistance Bed Mobility: Rolling Rolling: Max assist;+2 for physical assistance         General bed mobility comments: Pt resistive to and refusing supine - sit for seated EOB activities. Total A for repositioning in bed. Pt continuously saying, "move both my legs" and "get me coffee and sprite"  Transfers                 General transfer comment: unable    Balance                                           ADL either performed or assessed with clinical judgement   ADL                                               Vision Baseline Vision/History: Wears glasses Patient Visual Report: No change from baseline     Perception     Praxis      Cognition Arousal/Alertness:  Awake/alert Behavior During Therapy: Flat affect Overall Cognitive Status: No family/caregiver present to determine baseline cognitive functioning Area of Impairment: Memory;Following commands;Safety/judgement                 Orientation Level: Disoriented to;Situation;Time;Place     Following Commands: Follows one step commands inconsistently Safety/Judgement: Decreased awareness of deficits;Decreased awareness of safety   Problem Solving: Decreased initiation;Difficulty sequencing;Requires verbal cues General Comments: Pt can be very manipulative. Easily agitated. Wants to be in control and do things his way.        Exercises     Shoulder Instructions       General Comments      Pertinent Vitals/ Pain       Pain Assessment: Faces Faces Pain Scale: Hurts even more Pain Location: generalized Pain Descriptors / Indicators: Grimacing;Moaning Pain Intervention(s): Limited activity within patient's tolerance;Monitored during session;Repositioned  Home Living                                          Prior Functioning/Environment  Frequency  Min 2X/week        Progress Toward Goals  OT Goals(current goals can now be found in the care plan section)  Progress towards OT goals: OT to reassess next treatment  Acute Rehab OT Goals Patient Stated Goal: not stated ADL Goals Pt Will Perform Grooming: bed level;with min assist Pt Will Perform Upper Body Bathing: with mod assist;bed level Pt Will Perform Lower Body Bathing: with mod assist;bed level Additional ADL Goal #1: Pt will complete bed mobility mod A to sit EOB for simple grooming and ADL tasks  Plan Discharge plan remains appropriate;Frequency remains appropriate    Co-evaluation                 AM-PAC OT "6 Clicks" Daily Activity     Outcome Measure   Help from another person eating meals?: A Little Help from another person taking care of personal grooming?: A  Lot Help from another person toileting, which includes using toliet, bedpan, or urinal?: Total Help from another person bathing (including washing, rinsing, drying)?: A Lot Help from another person to put on and taking off regular upper body clothing?: A Lot Help from another person to put on and taking off regular lower body clothing?: Total 6 Click Score: 11    End of Session    OT Visit Diagnosis: Muscle weakness (generalized) (M62.81);Pain;Other symptoms and signs involving cognitive function;Unsteadiness on feet (R26.81) Pain - part of body: Ankle and joints of foot   Activity Tolerance Patient limited by pain;Treatment limited secondary to agitation   Patient Left in bed;with call bell/phone within reach;with bed alarm set   Nurse Communication          Time: 7893-8101 OT Time Calculation (min): 14 min  Charges: OT General Charges $OT Visit: 1 Visit OT Treatments $Therapeutic Activity: 8-22 mins     [name] 03/19/2019, 12:29 PM

## 2019-03-19 NOTE — TOC Progression Note (Signed)
Transition of Care [facility]) - Progression Note    Patient Details  Name: Clearence Vitug MRN: [id number] Date of Birth: May 03, 1960  Transition of [facility] CM/SW [place], [place] Phone Number: 03/19/2019, 1:17 PM  Clinical Narrative:    CSW following, aware of more comfort focus for pt care plan at this time. Remain available, no bed offers for SNF at this time.    Expected Discharge Plan: [place] Barriers to Discharge: No SNF bed, Continued Medical Work up  Expected Discharge Plan and Services Expected Discharge Plan: [place] In-house Referral: Clinical Social Work Discharge Planning Services: CM Consult Living arrangements for the past 2 months: Apartment  Readmission Risk Interventions Readmission Risk Prevention Plan 01/11/2019  Transportation Screening Complete  Medication Review Press photographer) Complete  PCP or Specialist appointment within 3-5 days of discharge Complete  HRI or [place] Complete  SW Recovery Care/Counseling Consult Complete  [place] Not Applicable  Some recent data might be hidden

## 2019-03-19 NOTE — Plan of Care (Signed)

## 2019-03-20 DIAGNOSIS — K7030 Alcoholic cirrhosis of liver without ascites: Secondary | ICD-10-CM

## 2019-03-20 LAB — COMPREHENSIVE METABOLIC PANEL
ALT: 29 U/L (ref 0–44)
AST: 33 U/L (ref 15–41)
Albumin: 1.6 g/dL — ABNORMAL LOW (ref 3.5–5.0)
Alkaline Phosphatase: 118 U/L (ref 38–126)
Anion gap: 11 (ref 5–15)
BUN: 127 mg/dL — ABNORMAL HIGH (ref 6–20)
CO2: 20 mmol/L — ABNORMAL LOW (ref 22–32)
Calcium: 10.7 mg/dL — ABNORMAL HIGH (ref 8.9–10.3)
Chloride: 96 mmol/L — ABNORMAL LOW (ref 98–111)
Creatinine, Ser: 3.11 mg/dL — ABNORMAL HIGH (ref 0.61–1.24)
GFR calc Af Amer: 24 mL/min — ABNORMAL LOW (ref >60)
GFR calc non Af Amer: 21 mL/min — ABNORMAL LOW (ref >60)
Glucose, Bld: 135 mg/dL — ABNORMAL HIGH (ref 70–99)
Potassium: 4.6 mmol/L (ref 3.5–5.1)
Sodium: 127 mmol/L — ABNORMAL LOW (ref 135–145)
Total Bilirubin: 0.5 mg/dL (ref 0.3–1.2)
Total Protein: 5.1 g/dL — ABNORMAL LOW (ref 6.5–8.1)

## 2019-03-20 LAB — CBC
HCT: 23.7 % — ABNORMAL LOW (ref 39.0–52.0)
Hemoglobin: 7.5 g/dL — ABNORMAL LOW (ref 13.0–17.0)
MCH: 30 pg (ref 26.0–34.0)
MCHC: 31.6 g/dL (ref 30.0–36.0)
MCV: 94.8 fL (ref 80.0–100.0)
Platelets: 122 10*3/uL — ABNORMAL LOW (ref 150–400)
RBC: 2.5 MIL/uL — ABNORMAL LOW (ref 4.22–5.81)
RDW: 18.5 % — ABNORMAL HIGH (ref 11.5–15.5)
WBC: 19.4 10*3/uL — ABNORMAL HIGH (ref 4.0–10.5)
nRBC: 0 % (ref 0.0–0.2)

## 2019-03-20 LAB — AMMONIA: Ammonia: 60 umol/L — ABNORMAL HIGH (ref 9–35)

## 2019-03-20 LAB — GLUCOSE, CAPILLARY
Glucose-Capillary: 137 mg/dL — ABNORMAL HIGH (ref 70–99)
Glucose-Capillary: 163 mg/dL — ABNORMAL HIGH (ref 70–99)
Glucose-Capillary: 118 mg/dL — ABNORMAL HIGH (ref 70–99)
Glucose-Capillary: 164 mg/dL — ABNORMAL HIGH (ref 70–99)

## 2019-03-20 NOTE — Progress Notes (Signed)
Left message on [name]'s phone [phone] to call back.

## 2019-03-20 NOTE — Progress Notes (Signed)
[name], pt's case manager from the ACT team from [facility] would like to speak with social worker. Ms. [name] can be reached at [phone]. Her office number is [phone]. I will relay this info to the oncoming nurse.

## 2019-03-20 NOTE — Progress Notes (Signed)
PROGRESS NOTE    [name] Rounds  ZOX:[id number] DOB: 10/01/1960 DOA: 02/08/2019 PCP: Fleet ContrasAvbuere, Edwin, MD   Brief Narrative:  58 year old with history of cirrhosis with splenomegaly, hepatitis C, esophageal varices, adrenal insufficiency, hypothyroidism, schizoaffective disorder, PTSD, CKD stage IIIa initially brought to the hospital for worsening abdominal pain, distention and rectal bleeding. Work-up showed perforated bowel and pneumoperitoneum. General surgery recommended nonoperative management with IV antibiotics and bowel rest. Due to worsening of the pain, repeat CT of the abdomen pelvis showed free intra-abdominal fluid and pneumoperitoneum. There is also perforated bowel near pyloric or stomach. Underwent UGI on 11/16 showing focal contained perforation in distal stomach. Palliative care team is following. Psychiatry evaluate the patient and stated does not have capacity to make his own decisions.  Patient has no family here, but he has few friends which has been helping with making medical decisions, [place] has been meeting with him frequently, discussing goals of care, patient comfort and happiness is the focus of care currently, with no escalation of care, and avoiding procedures will cause pain without significant change in outcome.   Subjective:   Patient remains confused today, he denies any complaints currently.  Assessment & Plan   Severe septic shock secondary to perforated bowel/peritoneum and gastric perforation/ SBP -GI and general surgery consulted and appreciated -Upper GI series showed gastric perforation upon admission 02/08/2019. -Surgical input greatly appreciated, patient extremely frail, with high risk for mortality for surgery, so conservative approach has been recommended by general surgery, palliative approach if possible. -Of note he was on TPN which is now been discontinued, on 03/06/2019.  He is advanced to soft diet currently. -CT abdomen on 03/09/2019: Large  volume pneumoperitoneum and ascites decreased from prior study. - Fluid culture results show few candida albicans.  Placed on ceftriaxone empirically and have started fluconazole.(He was on Zosyn initially during hospital stay) -Difficult situation to manage, in this patient with decompensated liver cirrhosis and ascites, with perforated bowel/peritoneum and SBP, overall prognosis is very poor given the fact he is not a surgical candidate, and high risk for mortality during his surgery, available option currently is to manage with antibiotic/antifungal.  - Palliative care input greatly appreciated, after discussing with friends(decision making surrogates), plan is to continue with current major with no escalation of care, and to enhance patient comfort and quality intervention at this point, so I have minimized his blood work, liberalized his diet.  Acute metabolic encephalopathy -Suspect multifactorial including sepsis versus uncontrolled underlying psychiatric illness -He is not able to truly understands his current health situation, has poor insight about his medical condition. -Patient appears to be more lethargic, and altered, his ammonia level is elevated, he has been refusing his meds including lactulose . -As well given increase his BUN, most likely uremia is contributing.  Decompensated hepatitis C with liver cirrhosis and ascites -Avoid hepatotoxic agents -Paracentesis as needed, will require paracentesis 11/13, 11/18,12/11. -Fluid culture as above shows a few Candida albicans  Acute kidney injury on CKD, stage IIIb -Baseline creatinine approximately 1.7-2 -Nephrology consulted and appreciated -Renal ultrasound suggestive of chronic medical renal disease -Continues to have worsening renal function, likely hepatorenal, function continues to worsen despite holding diuresis, and receiving albumin,.  Hypervolemic hyponatremia -Currently on fluid restriction, unable to give any Lasix or  Aldactone given worsening renal function.  Hypotension -Likely secondary to decompensated cirrhosis -BP does appear to be stable  -Hypotension has improved, have stopped Solu-Cortef -On midodrine, taper as appropriate  Thrombocytopenia -Likely secondary to decompensated hepatitis C/liver  cirrhosis -Continue supportive care -continue to monitor   Acute blood loss anemia/gastric and duodenal ulcer -EGD in October 2020 showed grade D esophagitis with nonbleeding gastric ulcer, duodenal ulcer and hiatal hernia -Continue PPI  Hyperglycemia -Hemoglobin A1c was 3.9 02/09/2019 -Hold off of insulin to avoid hypoglycemia   History of PTSD/schizoaffective disorder -Patient has been seen by psychiatry and deemed not to have capacity to make medical decisions -Family and friends are helping with those decisions  Physical debility/ambulatory dysfunction -PT recommended -Social work consulted and awaiting placement -Continue fall precautions   Goals of care -Given degree of liver cirrhosis with decompensation as well as well perforated bowel with pneumoperitoneum, feel that patient is at high risk for decompensation, as well high risk for mortality, as we will have discussed with general surgery patient mortality at 1 year would be 50% given his liver disease.  Mortality at operation greatly exceeds this.  Patient currently has 2 physicians DNR. -Palliative consult greatly appreciated, continue with current medical management, but no escalation of care, and will focus on measures which increases happiness and comfort, I do anticipate this patient will continue to decline, will continue with measures which increases happiness and comfort, including liberalized his diet, minimize his medication, especially I do anticipate this patient will continue to decline, especially he is at a point having worsening renal failure, worsening encephalopathy, hyperammonemia,at  one-point he will be appropriate for  hospice placement.  DVT Prophylaxis  SCDs  Code Status: DNR  Family Communication: Been updating close friends, mainly Ms. [name]  Disposition Plan: . Difficult placement.   Consultants Palliative care Interventional radiology Nephrology General surgery Psychiatry  Gastroenterology  Infectious disease via phone by Dr. Natale Milch  Procedures  Ultrasound-guided paracentesis, yielding 14 L clear yellow fluid on 02/25/2019 PICC line placed by IR on 02/21/2019 Echocardiogram  Antibiotics   Anti-infectives (From admission, onward)   Start     Dose/Rate Route Frequency Ordered Stop   03/12/19 1700  fluconazole (DIFLUCAN) tablet 400 mg     400 mg Oral Daily 03/12/19 1654     03/09/19 0800  cefTRIAXone (ROCEPHIN) 2 g in sodium chloride 0.9 % 100 mL IVPB     2 g 200 mL/hr over 30 Minutes Intravenous Every 24 hours 03/09/19 0703     02/08/19 2200  piperacillin-tazobactam (ZOSYN) IVPB 3.375 g  Status:  Discontinued     3.375 g 12.5 mL/hr over 240 Minutes Intravenous Every 8 hours 02/08/19 1552 03/06/19 1638   02/08/19 1545  piperacillin-tazobactam (ZOSYN) IVPB 3.375 g     3.375 g 100 mL/hr over 30 Minutes Intravenous  Once 02/08/19 1533 02/08/19 1840       Objective:   Vitals:   03/19/19 1510 03/19/19 2156 03/20/19 0544 03/20/19 1334  BP: (!) 99/57 100/64 (!) 100/55 (!) 98/51  Pulse: 99 90 79 91  Resp:  15 16 18   Temp: 98.3 F (36.8 C) 98.4 F (36.9 C) 97.8 F (36.6 C) (!) 97.5 F (36.4 C)  TempSrc: Oral Oral Oral Oral  SpO2: 97% 99% 97% 99%  Weight:      Height:        Intake/Output Summary (Last 24 hours) at 03/20/2019 1639 Last data filed at 03/20/2019 1600 Gross per 24 hour  Intake 440 ml  Output 400 ml  Net 40 ml   Filed Weights   03/08/19 0500 03/10/19 0439 03/19/19 0556  Weight: 132 kg 118.3 kg (!) 163.3 kg   Exam  Patient laying comfortably in the bed, he  is in no apparent distress, is awake, but significantly confused, with very poor cognition and  insight . Diminished entry bilaterally, no wheezing Regular rhythm, no murmur  Obese abdomen, nontender, bowel sounds present Edema +1 bilaterally.  Data Reviewed: I have personally reviewed following labs and imaging studies  CBC: Recent Labs  Lab 03/14/19 0516 03/15/19 0345 03/16/19 0420 03/18/19 0817 03/20/19 1004  WBC 24.4* 29.5* 29.2* 24.8* 19.4*  HGB 8.6* 9.2* 9.0* 7.8* 7.5*  HCT 26.8* 29.0* 28.2* 24.3* 23.7*  MCV 94.7 95.4 94.6 95.3 94.8  PLT 92* 114* 123* PLATELET CLUMPS NOTED ON SMEAR, COUNT APPEARS DECREASED 122*   Basic Metabolic Panel: Recent Labs  Lab 03/14/19 0516 03/15/19 0345 03/16/19 0420 03/18/19 0817 03/20/19 1004  NA 128* 124* 126* 127* 127*  K 5.3* 4.9 4.9 4.3 4.6  CL 95* 92* 95* 96* 96*  CO2 22 22 22  21* 20*  GLUCOSE 97 101* 98 124* 135*  BUN 108* 109* 112* 118* 127*  CREATININE 2.38* 2.57* 2.72* 2.82* 3.11*  CALCIUM 11.4* 11.2* 11.4* 11.4* 10.7*   GFR: Estimated Creatinine Clearance: 44 mL/min (A) (by C-G formula based on SCr of 3.11 mg/dL (H)). Liver Function Tests: Recent Labs  Lab 03/14/19 0516 03/15/19 0345 03/16/19 0420 03/18/19 0817 03/20/19 1004  AST 28 28 27 26  33  ALT 25 27 28 23 29   ALKPHOS 111 132* 127* 108 118  BILITOT 0.5 0.7 0.4 0.3 0.5  PROT 5.3* 5.5* 5.1* 5.0* 5.1*  ALBUMIN 1.5* 1.5* 1.4* 1.9* 1.6*   No results for input(s): LIPASE, AMYLASE in the last 168 hours. Recent Labs  Lab 03/20/19 1004  AMMONIA 60*   Coagulation Profile: No results for input(s): INR, PROTIME in the last 168 hours. Cardiac Enzymes: No results for input(s): CKTOTAL, CKMB, CKMBINDEX, TROPONINI in the last 168 hours. BNP (last 3 results) No results for input(s): PROBNP in the last 8760 hours. HbA1C: No results for input(s): HGBA1C in the last 72 hours. CBG: Recent Labs  Lab 03/19/19 1242 03/19/19 1725 03/19/19 2153 03/20/19 0759 03/20/19 1152  GLUCAP 113* 124* 130* 137* 163*   Lipid Profile: No results for input(s): CHOL, HDL,  LDLCALC, TRIG, CHOLHDL, LDLDIRECT in the last 72 hours. Thyroid Function Tests: No results for input(s): TSH, T4TOTAL, FREET4, T3FREE, THYROIDAB in the last 72 hours. Anemia Panel: No results for input(s): VITAMINB12, FOLATE, FERRITIN, TIBC, IRON, RETICCTPCT in the last 72 hours. Urine analysis:    Component Value Date/Time   COLORURINE YELLOW 02/18/2019 1839   APPEARANCEUR HAZY (A) 02/18/2019 1839   LABSPEC 1.019 02/18/2019 1839   PHURINE 5.0 02/18/2019 1839   GLUCOSEU NEGATIVE 02/18/2019 1839   HGBUR NEGATIVE 02/18/2019 1839   BILIRUBINUR NEGATIVE 02/18/2019 1839   KETONESUR NEGATIVE 02/18/2019 1839   PROTEINUR NEGATIVE 02/18/2019 1839   UROBILINOGEN 1.0 01/19/2013 0015   NITRITE NEGATIVE 02/18/2019 1839   LEUKOCYTESUR NEGATIVE 02/18/2019 1839   Sepsis Labs: @LABRCNTIP (procalcitonin:4,lacticidven:4)  ) No results found for this or any previous visit (from the past 240 hour(s)).    Radiology Studies: No results found.   Scheduled Meds: . benztropine mesylate  0.5 mg Intramuscular Daily  . Chlorhexidine Gluconate Cloth  6 each Topical Daily  . fluconazole  400 mg Oral Daily  . gabapentin  100 mg Oral QHS  . haloperidol  2 mg Oral Daily  . midodrine  5 mg Oral BID WC  . Muscle Rub   Topical BID  . OLANZapine zydis  5 mg Oral QHS  . pantoprazole  40  mg Oral BID  . sodium chloride flush  10-40 mL Intracatheter Q12H   Continuous Infusions: . sodium chloride    . sodium chloride Stopped (02/12/19 1737)  . sodium chloride Stopped (02/19/19 1422)  . cefTRIAXone (ROCEPHIN)  IV 2 g (03/20/19 0827)     LOS: 27 days   Phillips Climes M.D . on 03/20/2019 at 4:39 PM  Between 7am to 7pm - Please see pager noted on [email or web address]  After 7pm go to [email or web address]  And look for the night coverage person covering for me after hours  [facility] Office  [phone]

## 2019-03-20 NOTE — TOC Progression Note (Addendum)
Transition of Care [facility]) - Progression Note    Patient Details  Name: [name] MRN: [id number] Date of Birth: 02/16/1961  Transition of Care [facility]) CM/SW [place], [place] Phone Number: 03/20/2019, 3:24 PM  Clinical Narrative:    Noted that night shift RN had received call from Lorenz Coaster with pt ACT team [facility]. CSW called Nikki back at [phone]. Nikki states night shift RN Izora Gala had provided her with an update. She states she is aware this Probation officer is having difficulty finding placement and she wanted [place] to know "he had gone to a facility in [place] in the past for rehab and done well and come back in the community and regained his rights." CSW let Lexine Baton know that ongoing [place] are being addressed between pt friend group and our PMT. Medical management being done by hospitalist and PMT. Pt still requiring medical management and no SNF offers at this time.   Requested if she desired further medical update to contact bedside RN/MD. Upon chart review, after our conversation, pt had been placed at [facility] for behavioral health placement previously which is NOT a SNF placement. Potentially there may have been another placement however no other record of outside facility placement available on Epic at this time.  TOC team continuing to follow. No offers at this time for placement.    Expected Discharge Plan: [place] Barriers to Discharge: No SNF bed, Continued Medical Work up  Expected Discharge Plan and Services Expected Discharge Plan: [place] In-house Referral: Clinical Social Work Discharge Planning Services: CM Consult   Living arrangements for the past 2 months: Apartment  Readmission Risk Interventions Readmission Risk Prevention Plan 01/11/2019  Transportation Screening Complete  Medication Review Press photographer) Complete  PCP or Specialist appointment within 3-5 days of discharge Complete  HRI or  [place] Complete  SW Recovery Care/Counseling Consult Complete  [place] Not Applicable  Some recent data might be hidden

## 2019-03-20 NOTE — Progress Notes (Signed)
[name] and her friend will come to visit on Thursday at 1130hr. Dr.Dawood was made aware.

## 2019-03-21 DIAGNOSIS — N183 Chronic kidney disease, stage 3 unspecified: Secondary | ICD-10-CM

## 2019-03-21 LAB — GLUCOSE, CAPILLARY
Glucose-Capillary: 302 mg/dL — ABNORMAL HIGH (ref 70–99)
Glucose-Capillary: 117 mg/dL — ABNORMAL HIGH (ref 70–99)
Glucose-Capillary: 120 mg/dL — ABNORMAL HIGH (ref 70–99)
Glucose-Capillary: 117 mg/dL — ABNORMAL HIGH (ref 70–99)

## 2019-03-21 NOTE — Progress Notes (Signed)
Inpatient Diabetes Program Recommendations  AACE/ADA: New Consensus Statement on Inpatient Glycemic Control (2015)  Target Ranges:  Prepandial:   less than 140 mg/dL      Peak postprandial:   less than 180 mg/dL (1-2 hours)      Critically ill patients:  140 - 180 mg/dL   Lab Results  Component Value Date   GLUCAP 302 (H) 03/21/2019   HGBA1C 3.9 (L) 02/09/2019    Review of Glycemic Control Results for DYLLEN, MENNING (MRN [id number]) as of 03/21/2019 10:41  Ref. Range 03/20/2019 07:59 03/20/2019 11:52 03/20/2019 16:52 03/20/2019 20:53 03/21/2019 07:48  Glucose-Capillary Latest Ref Range: 70 - 99 mg/dL 137 (H) 163 (H) 118 (H) 164 (H) 302 (H)   Diabetes history: None  Current orders for Inpatient glycemic control: None  A1c 3.9% (not accurate due to Hgb at that time being 8.3  Inpatient Diabetes Program Recommendations:    Glucose 300's this am.  Consider Novolog 0-6 units tid "very sensitive" scale starting at 151 mg/dl.  Thanks,  Tama Headings RN, MSN, BC-ADM Inpatient Diabetes Coordinator Team Pager [phone] (8a-5p)

## 2019-03-21 NOTE — Progress Notes (Signed)
[facility]  PROGRESS NOTE  [name] ZOX:[id number] DOB: Aug 09, 1960 DOA: 02/08/2019 PCP: Fleet Contras, MD Admit date - 02/08/2019   Admitting Physician Oretha Milch, MD  Outpatient Primary MD for the patient is Fleet Contras, MD  LOS - 41 Brief Narrative   [name] is a 58 y.o. year old male with medical history significant for cirrhosis with splenomegaly, hepatitis C, esophageal varices, adrenal insufficiency, hypothyroidism, schizoaffective disorder, PTSD, CKD stage IIIa who presented on 02/08/2019 with abdominal pain, distention and rectal bleeding.  Admitted with working diagnosis of perforated bowel with pneumoperitoneum.  General surgery recommended nonoperative management IV antibiotics and bowel rest, UTI 11/16 showed contained perforation distal stomach.  Hospital course complicated by patient not having the ability to make decisions as he does not have capacity based on psychiatric evaluation during hospital stay, palliative care has been assisting with his goals of discussions with his friend who are his surrogate medical decision makers.  Main focus now with medical treatment is patient's comfort and happiness with no escalation of care undergoing procedures that will cause pain or prolong suffering without any significant change in outcome.  Hospital course complicated by fairly difficult situation given patient has decompensated liver cirrhosis with ascites a contained perforated bowel/peritoneum complicated further by SBP.  Overall prognosis is very poor given he is not a surgical candidate high risk for mortality during any potential surgery.  Currently quite stable on antifungal treatment.  -Previously on TPN, discontinued on 12/8 -Tolerating soft diet   Subjective  [name] today has no acute complaints and is looking forward to having pizza for lunch today  A & P  Guarded prognosis/goals of care discussion His medical decision making surrogates understand his  worsening clinical status -Focusing on treatments that enhance his comfort and quality of life none -two-physician DNR in place -If clinically continues to decline will likely be eligible for transfer to hospice facility -Medical chronic consecutive management as below  Severe septic shock secondary to perforated bowel/pneumoperitoneum/SBP Patient is not a surgical candidate given extreme medical frailty Have continued conservative management CT abdominal imaging on 12/11 shows decrease in large volume pneumoperitoneum Sepsis physiology resolved SBP Fluid culture (12/11) positive for Candida albicans Empirically on ceftriaxone/Zosyn prior -Fluconazole and ceftriaxone  Acute metabolic encephalopathy, multifactorial etiology Hepatic encephalopathy (ammonia 16, 12/22), and uremic encephalopathy ( BUN 127), metabolic (sodium 811). Lethargic, though alert to self and place In setting of PTSD/schizoaffective disorder -has declined further lactulose therapy -Patient does not have capacity to make medical decisions -Has family friends who are surrogate medical decision makers -Cogentin, scheduled Haldol, Zyprexa  Decompensated liver cirrhosis with ascites, setting of hepatitis C Has required multiple paracentesis (11/13, 11/18, 12/11) -Continue fluconazole for Candida albicans SBP-avoid hepatotoxic agents -Paracentesis as needed based on symptoms  AKI on CKD, worsening Previous baseline creatinine one-point 8-2 Currently 3.11 Still maintaining urine output Quite elevated BUN, potassium within normal limits, seems to be developing normal anion gap metabolic acidosis 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 -Daily BMP, avoid nephrotoxins  Hypervolemic hyponatremia Currently on fluid restriction, unable to give diuretics given worsening renal function  Hypotension, stable Secondary to decompensated cirrhosis BP range 90-100s systolic -Currently on midodrine 5 mg twice  daily  Thrombocytopenia, stable In setting of decompensated liver disease -No active signs symptoms of bleeding -Monitor CBC as needed  Hyperglycemia, improving CBGs on average has been in the 120s without any insulin coverage needed -Continue carefully monitor, will decrease CBG sticks given anxiety to make patient more comfortable  Profound physical debility/ambulatory dysfunction -PT recommends SNF -Patient has no SNF offers, continue to focus on comfort care at this time          Family Communication  : No family at bedside  Code Status : DNR  Disposition Plan  : Guarded prognosis  Consults  : Palliative care, IR, nephrology, general surgery, psychiatry, GI, ID  Procedures  :  Ultrasound-guided paracentesis, yielding 14 L clear yellow fluid on 02/25/2019 PICC line placed by IR on 02/21/2019 Echocardiogram  DVT Prophylaxis  : SCDs  Lab Results  Component Value Date   PLT 122 (L) 03/20/2019    Diet :  Diet Order            Diet regular Room service appropriate? Yes; Fluid consistency: Thin  Diet effective now               Inpatient Medications Scheduled Meds: . benztropine mesylate  0.5 mg Intramuscular Daily  . Chlorhexidine Gluconate Cloth  6 each Topical Daily  . fluconazole  400 mg Oral Daily  . gabapentin  100 mg Oral QHS  . haloperidol  2 mg Oral Daily  . midodrine  5 mg Oral BID WC  . Muscle Rub   Topical BID  . OLANZapine zydis  5 mg Oral QHS  . pantoprazole  40 mg Oral BID  . sodium chloride flush  10-40 mL Intracatheter Q12H   Continuous Infusions: . sodium chloride    . sodium chloride Stopped (02/12/19 1737)  . sodium chloride Stopped (02/19/19 1422)  . cefTRIAXone (ROCEPHIN)  IV 2 g (03/21/19 0839)   PRN Meds:.Place/Maintain arterial line **AND** sodium chloride, sodium chloride, haloperidol lactate, HYDROmorphone (DILAUDID) injection, lidocaine (PF), lidocaine, ondansetron (ZOFRAN) IV, sodium chloride flush, sodium chloride  flush, sodium chloride flush  Antibiotics  :   Anti-infectives (From admission, onward)   Start     Dose/Rate Route Frequency Ordered Stop   03/12/19 1700  fluconazole (DIFLUCAN) tablet 400 mg     400 mg Oral Daily 03/12/19 1654     03/09/19 0800  cefTRIAXone (ROCEPHIN) 2 g in sodium chloride 0.9 % 100 mL IVPB     2 g 200 mL/hr over 30 Minutes Intravenous Every 24 hours 03/09/19 0703     02/08/19 2200  piperacillin-tazobactam (ZOSYN) IVPB 3.375 g  Status:  Discontinued     3.375 g 12.5 mL/hr over 240 Minutes Intravenous Every 8 hours 02/08/19 1552 03/06/19 1638   02/08/19 1545  piperacillin-tazobactam (ZOSYN) IVPB 3.375 g     3.375 g 100 mL/hr over 30 Minutes Intravenous  Once 02/08/19 1533 02/08/19 1840       Objective   Vitals:   03/20/19 1334 03/20/19 2054 03/20/19 2135 03/21/19 0434  BP: (!) 98/51 103/61  103/63  Pulse: 91 87 87 89  Resp: Temp: (!) 97.5 F (36.4 C) (!) 97.5 F (36.4 C) 97.7 F (36.5 C) 97.7 F (36.5 C)  TempSrc: Oral Oral Axillary Oral  SpO2: 99% 98% 100% 99%  Weight:      Height:        SpO2: 99 %  Wt Readings from Last 3 Encounters:  03/19/19 (!) 163.3 kg  01/15/19 99.8 kg  01/07/19 99.8 kg     Intake/Output Summary (Last 24 hours) at 03/21/2019 1304 Last data filed at 03/21/2019 0600 Gross per 24 hour  Intake 520 ml  Output --  Net 520 ml    Physical Exam:  Chronically ill-appearing male looks older  than stated age Awake Alert, Oriented X self, place ( says [facility]) Soft, very distended abdomen, non-tender, normal bowel sounds Ostomy bag in place 2+ pitting edema of bilateral lower extremities from ankle to mid calf.  No new F.N deficits,     I have personally reviewed the following:   Data Reviewed:  CBC Recent Labs  Lab 03/15/19 0345 03/16/19 0420 03/18/19 0817 03/20/19 1004  WBC 29.5* 29.2* 24.8* 19.4*  HGB 9.2* 9.0* 7.8* 7.5*  HCT 29.0* 28.2* 24.3* 23.7*  PLT 114* 123* PLATELET CLUMPS  NOTED ON SMEAR, COUNT APPEARS DECREASED 122*  MCV 95.4 94.6 95.3 94.8  MCH 30.3 30.2 30.6 30.0  MCHC 31.7 31.9 32.1 31.6  RDW 18.6* 18.8* 18.7* 18.5*    Chemistries  Recent Labs  Lab 03/15/19 0345 03/16/19 0420 03/18/19 0817 03/20/19 1004  NA 124* 126* 127* 127*  K 4.9 4.9 4.3 4.6  CL 92* 95* 96* 96*  CO2 22 22 21* 20*  GLUCOSE 101* 98 124* 135*  BUN 109* 112* 118* 127*  CREATININE 2.57* 2.72* 2.82* 3.11*  CALCIUM 11.2* 11.4* 11.4* 10.7*  AST 33  ALT ALKPHOS 132* 127* 108 118  BILITOT 0.7 0.4 0.3 0.5   ------------------------------------------------------------------------------------------------------------------ No results for input(s): CHOL, HDL, LDLCALC, TRIG, CHOLHDL, LDLDIRECT in the last 72 hours.  Lab Results  Component Value Date   HGBA1C 3.9 (L) 02/09/2019   ------------------------------------------------------------------------------------------------------------------ No results for input(s): TSH, T4TOTAL, T3FREE, THYROIDAB in the last 72 hours.  Invalid input(s): FREET3 ------------------------------------------------------------------------------------------------------------------ No results for input(s): VITAMINB12, FOLATE, FERRITIN, TIBC, IRON, RETICCTPCT in the last 72 hours.  Coagulation profile No results for input(s): INR, PROTIME in the last 168 hours.  No results for input(s): DDIMER in the last 72 hours.  Cardiac Enzymes No results for input(s): CKMB, TROPONINI, MYOGLOBIN in the last 168 hours.  Invalid input(s): CK ------------------------------------------------------------------------------------------------------------------ No results found for: BNP  Micro Results No results found for this or any previous visit (from the past 240 hour(s)).  Radiology Reports CT ABDOMEN PELVIS WO CONTRAST  Result Date: 03/10/2019 CLINICAL DATA:  Sepsis.  Generalized abdominal pain. EXAM: CT ABDOMEN AND PELVIS WITHOUT  CONTRAST TECHNIQUE: Multidetector CT imaging of the abdomen and pelvis was performed following the standard protocol without IV contrast. COMPARISON:  March 02, 2019 FINDINGS: Lower chest: There are bilateral pleural effusions that are only partially visualized. There is atelectasis at the lung bases. The heart size appears to be enlarged. Hepatobiliary: The liver is cirrhotic. Normal gallbladder.There is no biliary ductal dilation. Pancreas: Normal contours without ductal dilatation. No peripancreatic fluid collection. Spleen: The spleen is enlarged. Adrenals/Urinary Tract: --Adrenal glands: No adrenal hemorrhage. --Right kidney/ureter: No hydronephrosis or perinephric hematoma. --Left kidney/ureter: No hydronephrosis or perinephric hematoma. --Urinary bladder: Unremarkable. Stomach/Bowel: --Stomach/Duodenum: Oral contrast is noted within the stomach. --Small bowel: No dilatation or inflammation. --Colon: No focal abnormality. --Appendix: The appendix is not clearly identified. Vascular/Lymphatic: Atherosclerotic calcification is present within the non-aneurysmal abdominal aorta, without hemodynamically significant stenosis. Portosystemic shunting is noted. --No retroperitoneal lymphadenopathy. --No mesenteric lymphadenopathy. --No pelvic or inguinal lymphadenopathy. Reproductive: Unremarkable Other: Extensive pneumoperitoneum is again noted, decreased in volume from the prior study. There is a large volume of abdominal ascites, decreased from prior study. There is new thickening of the peritoneum. There is diffuse mesenteric edema. There is anasarca. Musculoskeletal. There is an old healed fracture deformity of the right proximal hip with subluxation of the femoral head. There is no acute displaced  fracture. IMPRESSION: 1. Large volume pneumoperitoneum, decreased from prior study. The exact site of bowel perforation is not identified on this exam. 2. Large volume abdominal free fluid, decreased from prior study.  3. Probable peritonitis, although not well evaluated in the absence of IV contrast. 4. Anasarca. 5. Bilateral pleural effusions. 6. Cirrhosis with stigmata of portal hypertension. Electronically Signed   By: Katherine Mantlehristopher  Green M.D.   On: 03/10/2019 00:38   CT ABDOMEN PELVIS WO CONTRAST  Result Date: 03/02/2019 CLINICAL DATA:  Inpatient. Worsening abdominal pain and distention. Known distal gastric perforation and pneumoperitoneum. GI bleed. Cirrhosis. EXAM: CT ABDOMEN AND PELVIS WITHOUT CONTRAST TECHNIQUE: Multidetector CT imaging of the abdomen and pelvis was performed following the standard protocol without IV contrast. COMPARISON:  02/17/2019 CT abdomen/pelvis. FINDINGS: Lower chest: Small dependent bilateral pleural effusions, slightly increased bilaterally. Mild dependent right lung base atelectasis. Dense left lower lobe consolidation with air bronchograms, similar. Stable lower esophageal varices. Symmetric stable moderate gynecomastia. Hepatobiliary: Atrophic liver with diffusely irregular liver surface, compatible with cirrhosis. No liver masses. Layering sludge with possible tiny calcified gallstones in the nondistended gallbladder with no gallbladder wall thickening. No biliary ductal dilatation. Pancreas: Normal, with no mass or duct dilation. Spleen: Moderate splenomegaly. Craniocaudal splenic length 19.0 cm. Stable. No splenic mass. Adrenals/Urinary Tract: Normal adrenals. No renal stones. No hydronephrosis. Simple 3.3 cm interpolar right renal cyst. Simple 1.5 cm interpolar left renal cyst. Normal bladder. Stomach/Bowel: Nondistended stomach. Wall thickening in the gastric antrum, unchanged. Irregularity of anterior gastric antrum luminal contour with suggestion of tiny outpouching (series 3/image 46), which does not definitely communicate with large volume anterior peritoneal cavity free air and ascites. Normal caliber small bowel with no small bowel wall thickening. Candidate normal appendix.  Scattered fluid levels in the large bowel with no large bowel wall thickening, diverticulosis or acute pericolonic fat stranding. Vascular/Lymphatic: Atherosclerotic nonaneurysmal abdominal aorta. Stable splenic hilar varices. No pathologically enlarged lymph nodes in the abdomen or pelvis. Reproductive: Normal size prostate. Other: Large volume ascites with large volume pneumoperitoneum, not substantially changed from prior CT. Stable small umbilical hernia containing gas and fluid. Anasarca is similar. Musculoskeletal: Chronic severe right hip deformity with surrounding hypertrophic ossific change. Mild thoracolumbar spondylosis. No new focal osseous lesions. IMPRESSION: 1. Large volume ascites with large volume pneumoperitoneum predominantly in the anterior peritoneal cavity, not substantially changed since 02/17/2019 CT. 2. Persistent nonspecific wall thickening in the gastric antrum. Irregular luminal contour in the anterior gastric antrum compatible with contained perforation seen on 02/12/2019 upper GI study. No obvious direct communication of the gastric lumen with the peritoneal cavity on this limited noncontrast CT study. 3. Cirrhosis.  Stable splenic hilar and lower esophageal varices. 4. Small dependent bilateral pleural effusions, slightly increased bilaterally. Anasarca. 5. Dense left lung base consolidation with air bronchograms, favor atelectasis, with a component of pneumonia not excluded. 6. Fluid levels throughout the large bowel indicative of a nonspecific malabsorptive state. 7.  Aortic Atherosclerosis (ICD10-I70.0). Electronically Signed   By: Delbert PhenixJason A Poff M.D.   On: 03/02/2019 09:23   US Abdomen Complete  Result Date: 03/13/2019 CLINICAL DATA:  Abdominal pain.  Cirrhosis. EXAM: ABDOMEN ULTRASOUND COMPLETE COMPARISON:  Noncontrast CT on 03/09/2019 FINDINGS: Technically difficult evaluation due to patient habitus and bowel gas. Gallbladder: Not visualized. Common bile duct: Diameter: Not  visualized. Liver: Diffuse coarsening of hepatic echotexture and capsular nodularity, consistent with cirrhosis. No liver mass identified. Portal vein is patent on color Doppler imaging with normal direction of blood flow towards the  liver. IVC: No abnormality visualized. Pancreas: Not visualized due to overlying bowel gas. Spleen: Not visualized Right Kidney: Length: 11.2 cm. Echogenicity within normal limits. A few simple renal cysts are seen, largest measured 3.3 cm. No mass or hydronephrosis visualized. Left Kidney: Not visualized due to patient habitus and bowel gas. Abdominal aorta: No proximal abdominal aortic aneurysm visualized. Distal abdominal aorta not visualized due to overlying bowel gas. Other findings: Mild-to-moderate ascites is seen. IMPRESSION: Technically suboptimal exam due to patient habitus and bowel gas. Hepatic cirrhosis and mild-to-moderate ascites. Electronically Signed   By: Danae OrleansJohn A Stahl M.D.   On: 03/13/2019 19:05   US Paracentesis  Result Date: 02/25/2019 INDICATION: Cirrhosis with recurrent ascites. Request for therapeutic paracentesis. EXAM: ULTRASOUND GUIDED RIGHT LOWER QUADRANT PARACENTESIS MEDICATIONS: None. COMPLICATIONS: None immediate. PROCEDURE: Informed written consent was obtained from the patient after a discussion of the risks, benefits and alternatives to treatment. A timeout was performed prior to the initiation of the procedure. Initial ultrasound scanning demonstrates a large amount of ascites within the right lower abdominal quadrant. The right lower abdomen was prepped and draped in the usual sterile fashion. 1% lidocaine was used for local anesthesia. Following this, a 19 gauge, 7-cm, Yueh catheter was introduced. An ultrasound image was saved for documentation purposes. The paracentesis was performed. The catheter was removed and a dressing was applied. The patient tolerated the procedure well without immediate post procedural complication. FINDINGS: A total of  approximately 14 L of clear yellow fluid was removed. IMPRESSION: Successful ultrasound-guided paracentesis yielding 14 liters of peritoneal fluid. Read by: Brayton ElKevin Bruning PA-C Electronically Signed   By: Irish LackGlenn  Yamagata M.D.   On: 02/25/2019 15:01   DG CHEST PORT 1 VIEW  Result Date: 02/20/2019 CLINICAL DATA:  PICC line placement. EXAM: PORTABLE CHEST 1 VIEW COMPARISON:  02/09/2019. FINDINGS: Interval removal of right IJ line. Interval placement of right PICC line, its tip is over the upper portion of the SVC. Stable cardiomegaly. Significant improvement in left base aeration. Persistent bibasilar atelectasis/infiltrates. Small left pleural effusion. No pneumothorax. IMPRESSION: 1. Interim removal of right IJ line. Interval placement of right PICC line, its tip is over the upper portion of the SVC. 2.  Stable cardiomegaly. 3. Significant improvement in left base aeration. Persistent bibasilar atelectasis/infiltrates. Small left pleural effusion. Electronically Signed   By: Maisie Fushomas  Register   On: 02/20/2019 08:58   DG Abd Portable 1V  Result Date: 02/28/2019 CLINICAL DATA:  Abdominal distension, history of cirrhosis EXAM: PORTABLE ABDOMEN - 1 VIEW COMPARISON:  None FINDINGS: Centralization of bowel loops, which may reflect presence of ascites. Bowel gas pattern is otherwise unremarkable. Chronic degenerative changes and heterotopic ossification at the right hip. IMPRESSION: Bowel gas pattern suggestive of ascites. Electronically Signed   By: Guadlupe SpanishPraneil  Patel M.D.   On: 02/28/2019 10:33   ECHOCARDIOGRAM COMPLETE  Result Date: 03/11/2019   ECHOCARDIOGRAM REPORT   Patient Name:   Juliann PulseDNAN Ferger Date of Exam: 03/11/2019 Medical Rec #:  [id number]   Height:       78.0 in Accession #:    [id number]  Weight:       260.8 lb Date of Birth:  12/25/1960    BSA:          2.53 m Patient Age:    58 years    BP:           111/62 mmHg Patient Gender: M           HR:  93 bpm. Exam Location:  Inpatient Procedure: 2D  Echo Indications:    Murmur 785.2/R01.1  History:        Patient has prior history of Echocardiogram examinations, most                 recent 01/30/2010. Risk Factors:Current Smoker. CKD.  Sonographer:    Ross Ludwig RDCS (AE) Referring Phys: [id number] [facility]  Sonographer Comments: Suboptimal parasternal window and suboptimal subcostal window. Parasteral long and short axis images suboptimal due to chest wall deformity. IMPRESSIONS  1. Left ventricular ejection fraction, by visual estimation, is 70 to 75%. The left ventricle has hyperdynamic function. There is mildly increased left ventricular hypertrophy.  2. Left ventricular diastolic parameters are consistent with Grade I diastolic dysfunction (impaired relaxation).  3. The left ventricle has no regional wall motion abnormalities.  4. There is SAM of the anterior mitral valve leaflet in the setting of moderate basal septal hypertrophy and a hyperdynamic LV creating a dynamic LVOT gradient. Difficult Doppler assessment of gradient to seperate from Benjamin signal. The peak gradient appears to be 65 mmHg.  5. Global right ventricle has normal systolic function.The right ventricular size is normal. No increase in right ventricular wall thickness.  6. Left atrial size was mild-moderately dilated.  7. Right atrial size was mildly dilated.  8. The mitral valve is normal in structure. Mild mitral valve regurgitation. No evidence of mitral stenosis.  9. The tricuspid valve is normal in structure. Tricuspid valve regurgitation is not demonstrated. 10. The aortic valve was not well visualized. Aortic valve regurgitation is not visualized. Diffilcult Doppler assessment of AV in setting of dynamic LVOT gradient. Structurally the valve appears normal, no significant stenosis. The elevated gradient appears to be subvalvular. 11. The pulmonic valve was not well visualized. Pulmonic valve regurgitation is not visualized. FINDINGS  Left Ventricle: Left ventricular ejection  fraction, by visual estimation, is 70 to 75%. The left ventricle has hyperdynamic function. The left ventricle has no regional wall motion abnormalities. There is mildly increased left ventricular hypertrophy. Left ventricular diastolic parameters are consistent with Grade I diastolic dysfunction (impaired relaxation). Normal left atrial pressure. There is SAM of the anterior mitral valve leaflet in the setting of moderate basal septal hypertrophy and a hyperdynamic LV creating a dynamic LVOT gradient. Difficult Doppler assessment of gradient to seperate from Benjamin signal. The peak gradient appears to be 65 mmHg. Right Ventricle: The right ventricular size is normal. No increase in right ventricular wall thickness. Global RV systolic function is has normal systolic function. Left Atrium: Left atrial size was mild-moderately dilated. Right Atrium: Right atrial size was mildly dilated Pericardium: There is no evidence of pericardial effusion. Mitral Valve: The mitral valve is normal in structure. Mild mitral valve regurgitation. No evidence of mitral valve stenosis by observation. MV peak gradient, 9.5 mmHg. Tricuspid Valve: The tricuspid valve is normal in structure. Tricuspid valve regurgitation is not demonstrated. Aortic Valve: The aortic valve was not well visualized. Aortic valve regurgitation is not visualized. Diffilcult Doppler assessment of AV in setting of dynamic LVOT gradient. Structurally the valve appears normal, no significant stenosis. The elevated gradient appears to be subvalvular. Aortic valve mean gradient measures 32.0 mmHg. Aortic valve peak gradient measures 67.4 mmHg. Pulmonic Valve: The pulmonic valve was not well visualized. Pulmonic valve regurgitation is not visualized. Pulmonic regurgitation is not visualized. Aorta: The aortic root is normal in size and structure. Pulmonary Artery: Indeterminant PASP, inadequate TR jet. IAS/Shunts: No atrial level shunt detected  by color flow Doppler.  LEFT  VENTRICLE PLAX 2D LVIDd:         3.70 cm  Diastology LVIDs:         2.30 cm  LV e' lateral:   10.10 cm/s LV PW:         1.10 cm  LV E/e' lateral: 10.9 LV IVS:        1.10 cm  LV e' medial:    7.83 cm/s LVOT diam:     2.00 cm  LV E/e' medial:  14.0 LV SV:         40 ml LV SV Index:   15.55 LVOT Area:     3.14 cm  RIGHT VENTRICLE RV Basal diam:  2.80 cm RV S prime:     22.90 cm/s TAPSE (M-mode): 2.9 cm LEFT ATRIUM              Index       RIGHT ATRIUM           Index LA diam:        2.60 cm  1.03 cm/m  RA Area:     21.50 cm LA Vol (A2C):   131.0 ml 51.84 ml/m RA Volume:   60.90 ml  24.10 ml/m LA Vol (A4C):   76.6 ml  30.31 ml/m LA Biplane Vol: 100.0 ml 39.57 ml/m  AORTIC VALVE AV Vmax:      410.40 cm/s AV Vmean:     259.800 cm/s AV VTI:       0.743 m AV Peak Grad: 67.4 mmHg AV Mean Grad: 32.0 mmHg  AORTA Ao Root diam: 3.10 cm MITRAL VALVE MV Area (PHT): 1.77 cm              SHUNTS MV Peak grad:  9.5 mmHg              Systemic Diam: 2.00 cm MV Mean grad:  4.0 mmHg MV Vmax:       1.54 m/s MV Vmean:      97.2 cm/s MV VTI:        0.36 m MV PHT:        124.12 msec MV Decel Time: 428 msec MV E velocity: 110.00 cm/s 103 cm/s MV A velocity: 123.00 cm/s 70.3 cm/s MV E/A ratio:  0.89        1.5  Carlyle Dolly MD Electronically signed by Carlyle Dolly MD Signature Date/Time: 03/11/2019/1:58:02 PM    Final    IR PICC PLACEMENT RIGHT >5 YRS INC IMG GUIDE  Result Date: 02/21/2019 CLINICAL DATA:  Gastric perforation. Indwelling PICC line placed for parental nutrition was occluded and now also has been in a vertically completely pulled out by the patient. Request has been made to place a new PICC line. EXAM: POWER PICC LINE PLACEMENT WITH ULTRASOUND AND FLUOROSCOPIC GUIDANCE FLUOROSCOPY TIME:  42 seconds.  2.3 mGy. PROCEDURE: Consent for new PICC line placement was obtained emergently from the admitting service due to the patient's schizoaffective disorder and inability to obtain consent from a family member. The  patient was then brought to the angiographic suite for the procedure. A time-out was performed prior to initiating the procedure. The right arm was prepped with chlorhexidine, draped in the usual sterile fashion using maximum barrier technique (cap and mask, sterile gown, sterile gloves, large sterile sheet, hand hygiene and cutaneous antisepsis) and infiltrated locally with 1% Lidocaine. Ultrasound demonstrated patency of the right brachial vein, and this was documented with an image. Under real-time  ultrasound guidance, this vein was accessed with a 21 gauge micropuncture needle and image documentation was performed. A 0.018 wire was introduced in to the vein. Over this, a 5.0 [place] dual lumen power injectable PICC was advanced to the lower SVC/right atrial junction. Fluoroscopy during the procedure and fluoro spot radiograph confirms appropriate catheter position. Prolene retention sutures were applied at the catheter exit site. The catheter was flushed and covered with a sterile dressing. Catheter length: 38 cm COMPLICATIONS: None IMPRESSION: Successful right arm power injectable PICC line placement with ultrasound and fluoroscopic guidance. The catheter is ready for use. Electronically Signed   By: Irish Lack M.D.   On: 02/21/2019 18:09   IR Paracentesis  Result Date: 03/09/2019 INDICATION: History of cirrhosis with recurrent ascites. None contain gastric perforation with pneumoperitoneum. Leukocytosis. Request repeat diagnostic and therapeutic paracentesis. EXAM: ULTRASOUND GUIDED LEFT LOWER QUADRANT PARACENTESIS MEDICATIONS: None. COMPLICATIONS: None immediate. PROCEDURE: Informed written consent was obtained from the patient after a discussion of the risks, benefits and alternatives to treatment. A timeout was performed prior to the initiation of the procedure. Initial ultrasound scanning demonstrates a moderate amount of ascites within the left lower abdominal quadrant. Fluid appears more complex  with floating debris on today's exam. The left lower abdomen was prepped and draped in the usual sterile fashion. 1% lidocaine was used for local anesthesia. Following this, a 19 gauge, 7-cm, Yueh catheter was introduced. An ultrasound image was saved for documentation purposes. The paracentesis was performed. The catheter was removed and a dressing was applied. The patient tolerated the procedure well without immediate post procedural complication. FINDINGS: A total of approximately 1 L of thick, turbid, beige/purulent fluid was removed. Samples were sent to the laboratory as requested by the clinical team. IMPRESSION: Successful ultrasound-guided paracentesis yielding 1 liters of peritoneal fluid. Findings were relayed to the ordering physician. Read by: Brayton El PA-C Electronically Signed   By: Gilmer Mor D.O.   On: 03/09/2019 15:41   IR Paracentesis  Result Date: 03/08/2019 INDICATION: Patient with history of cirrhosis and recurrent ascites. Request to IR for therapeutic paracentesis with post procedure albumin administration. EXAM: ULTRASOUND GUIDED THERAPEUTIC PARACENTESIS MEDICATIONS: 10 mL 1% lidocaine COMPLICATIONS: None immediate. PROCEDURE: Informed written consent was obtained from the patient after a discussion of the risks, benefits and alternatives to treatment. A timeout was performed prior to the initiation of the procedure. Initial ultrasound scanning demonstrates a large amount of ascites within the left lower abdominal quadrant. The left lower abdomen was prepped and draped in the usual sterile fashion. 1% lidocaine was used for local anesthesia. Following this, a 19 gauge, 10 cm, Yueh catheter was introduced. An ultrasound image was saved for documentation purposes. The paracentesis was performed. The catheter was removed and a dressing was applied. The patient tolerated the procedure well without immediate post procedural complication. Patient received post-procedure intravenous  albumin; see nursing notes for details. FINDINGS: A total of approximately 9.0 L of clear yellow fluid was removed. IMPRESSION: Successful ultrasound-guided paracentesis yielding 9.0 liters of peritoneal fluid. Read by Lynnette Caffey, PA-C Electronically Signed   By: Richarda Overlie M.D.   On: 03/08/2019 11:04     Time Spent in minutes  30     Laverna Peace M.D on 03/21/2019 at 1:04 PM  To page go to [email or web address] - password [facility]

## 2019-03-21 NOTE — TOC Progression Note (Signed)
Transition of Care [facility]) - Progression Note    Patient Details  Name: [name] MRN: [id number] Date of Birth: [birth year]  Transition of Care [facility]) CM/SW [place], [place] Phone Number: 03/21/2019, 12:14 PM  Clinical Narrative:    [facility] team continuing to follow- pt with no SNF offers, focus more towards comfort care at this time. Continue to follow to support disposition.    Expected Discharge Plan: [place] Barriers to Discharge: No SNF bed, Continued Medical Work up  Expected Discharge Plan and Services Expected Discharge Plan: [place] In-house Referral: Clinical Social Work Discharge Planning Services: CM Consult   Living arrangements for the past 2 months: Apartment  Readmission Risk Interventions Readmission Risk Prevention Plan 01/11/2019  Transportation Screening Complete  Medication Review Press photographer) Complete  PCP or Specialist appointment within 3-5 days of discharge Complete  HRI or [place] Complete  SW Recovery Care/Counseling Consult Complete  [place] Not Applicable  Some recent data might be hidden

## 2019-03-21 NOTE — Progress Notes (Addendum)
Daily Progress Note   Patient Name: [name]       Date: 03/21/2019 DOB: 1961/01/19  Age: 58 y.o. MRN#: [id number] Attending Physician: Desiree Hane, MD Primary Care Physician: Nolene Ebbs, MD Admit Date: 02/08/2019  Reason for Consultation/Follow-up: Establishing goals of care  Subjective: [name] is awake and alert this afternoon. He complains of pain 'all over' including his abdomen and head. He is enjoying cheese pizza and a coke for lunch. Reassured patient that I would ask nurse to give him pain medication. No visitors at bedside.  Spoke with [name] via telephone. Her and [name] plan to visit the hospital tomorrow around 11:30am. Per [name], Dr. Waldron Labs approved for both [name] and [name] to visit [name] tomorrow afternoon. Updated RN. Explained to [name] that palliative provider will not be available tomorrow during her visit, but that [name] (who is familiar with patient) will be available this weekend and will continue to follow up with her with updates/decline.   Gave [name] update. She is very appreciative of the care [name] is receiving. Emphasized focus on comfort feeds and symptom management.   Length of Stay: 41  Current Medications: Scheduled Meds:  . benztropine mesylate  0.5 mg Intramuscular Daily  . Chlorhexidine Gluconate Cloth  6 each Topical Daily  . fluconazole  400 mg Oral Daily  . gabapentin  100 mg Oral QHS  . haloperidol  2 mg Oral Daily  . midodrine  5 mg Oral BID WC  . Muscle Rub   Topical BID  . OLANZapine zydis  5 mg Oral QHS  . pantoprazole  40 mg Oral BID  . sodium chloride flush  10-40 mL Intracatheter Q12H    Continuous Infusions: . sodium chloride    . sodium chloride Stopped (02/12/19 1737)  . sodium chloride Stopped (02/19/19  1422)  . cefTRIAXone (ROCEPHIN)  IV 2 g (03/21/19 0839)    PRN Meds: Place/Maintain arterial line **AND** sodium chloride, sodium chloride, haloperidol lactate, HYDROmorphone (DILAUDID) injection, lidocaine (PF), lidocaine, ondansetron (ZOFRAN) IV, sodium chloride flush, sodium chloride flush, sodium chloride flush  Physical Exam Vitals and nursing note reviewed.  Constitutional:      General: He is awake.     Appearance: He is ill-appearing.  HENT:     Head: Normocephalic and atraumatic.  Pulmonary:     Effort:  No tachypnea, accessory muscle usage or respiratory distress.  Abdominal:     General: There is distension.     Tenderness: There is abdominal tenderness.  Skin:    General: Skin is warm and dry.  Neurological:     Mental Status: He is alert.     Comments: Oriented to person/place  Psychiatric:        Attention and Perception: He is inattentive.            Vital Signs: BP 103/63 (BP Location: Left Arm)   Pulse 89   Temp 97.7 F (36.5 C) (Oral)   Resp 17   Ht 6\' 6"  (1.981 m)   Wt (!) 163.3 kg   SpO2 99%   BMI 41.60 kg/m  SpO2: SpO2: 99 % O2 Device: O2 Device: Room Air O2 Flow Rate:    Intake/output summary:   Intake/Output Summary (Last 24 hours) at 03/21/2019 1326 Last data filed at 03/21/2019 0600 Gross per 24 hour  Intake 520 ml  Output --  Net 520 ml   LBM: Last BM Date: 03/20/19 Baseline Weight: Weight: 99.8 kg Most recent weight: Weight: (!) 163.3 kg       Palliative Assessment/Data: PPS 30%    Flowsheet Rows     Most Recent Value  Intake Tab  Referral Department  Hospitalist  Unit at Time of Referral  ER  Date Notified  02/09/19  Palliative Care Type  New Palliative care  Reason for referral  Clarify Goals of Care  Date of Admission  02/08/19  Date first seen by Palliative Care  02/11/19  # of days Palliative referral response time  2 Day(s)  # of days IP prior to Palliative referral  1  Clinical Assessment  Psychosocial &  Spiritual Assessment  Palliative Care Outcomes      Patient Active Problem List   Diagnosis Date Noted  . Rheumatoid arthritis involving multiple sites (HCC)   . Pressure injury of skin 03/01/2019  . Advanced care planning/counseling discussion   . Abdominal distention   . Acute renal failure with acute tubular necrosis superimposed on stage 3b chronic kidney disease (HCC)   . Palliative care encounter   . Palliative care by specialist   . AKI (acute kidney injury) (HCC)   . Goals of care, counseling/discussion   . Septic shock (HCC) 02/09/2019  . Pneumoperitoneum   . Central venous catheter in place   . Abdominal pain 02/08/2019  . Bowel perforation (HCC) 02/08/2019  . Bleeding per rectum   . Acute gastric ulcer   . Duodenal ulcer   . Acute esophagitis   . Acute blood loss anemia   . Hematemesis 01/08/2019  . Prolonged QT interval 01/08/2019  . Esophageal varices in cirrhosis (HCC) 01/08/2019  . CKD (chronic kidney disease) stage 3, GFR 30-59 ml/min 01/08/2019  . Suicidal ideation 01/08/2019  . Closed displaced fracture of neck of left fifth metacarpal bone 01/08/2019  . GI bleed 08/07/2018  . Acute abdominal pain   . Cirrhosis (HCC)   . Generalized abdominal pain   . Hematemesis with nausea   . Evaluation by psychiatric service required   . Acute upper gastrointestinal bleeding 10/15/2017  . PTSD (post-traumatic stress disorder)   . Hypothyroidism   . History of adrenal insufficiency   . H/O diabetes insipidus   . Schizoaffective disorder (HCC) 11/08/2011  . Personality disorder (HCC)   . Normocytic anemia   . Ascites   . Liver disease   . Hepatitis C   .  Kidney disease   . 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 Surgery was consulted but patient was not felt to be a surgical candidate due to the high risk from comorbidities. He has been treated conservatively. Palliative care was consulted to help address goals.  Assessment: Septic shock Bowel perforation/pneumonperitoneum/gastric perforation Decompensated liver cirrhosis Recurrent ascites Anemia with GIB ARF with CKD III Hx of depression, PTSD, schizoaffective disorder Hx of chronic pain  Recommendations/Plan:  Friends (medical decision making surrogates [name] and [name]) are understanding and accepting of patient's declining medical status. His friends understand that he will decline (is declining) and when he reaches a certain point he will be [facility]osp[facility] eligible.They have indicated that when he is eligible for hospice they want him transferred there.  Recommend focusing on treatments that enhance patient's comfort and quality of life, and de-escalating interventions and medications that do not provide long term benefit but merely prolong his current state.   2 Physician DNR  With further decline, consider transfer to hospice facility. Worsening renal function, hemoglobin, and ammonia level.   Code Status: DNR   Code Status Orders  (From admission, onward)         Start     Ordered   02/08/19 1552  Full code  Continuous     02/08/19 1553        Code Status History    Date Active Date Inactive Code Status Order ID Comments User Context   01/08/2019 1118 01/11/2019 2224 Full Code [id number]  Clydie BraunSmith, Rondell A, MD ED   08/07/2018 0935 08/08/2018 1329 Full Code [id number]  Alwyn RenMathews, Elizabeth G, MD ED   10/15/2017 0943 10/18/2017 1813 Full Code [id number]  Lynnell CatalanAgarwala, Ravi, MD ED   01/17/2013 1534 01/23/2013 1730 Full Code [id number]   Trevor MaceAlbert, Robyn M, PA-C ED   12/30/2011 0256 12/31/2011 1312 Full Code [id number]  Liliane BadeFlinn, Kristen L, RN Inpatient   12/24/2011 1816 12/30/2011 0256 Full Code [id number]  Jones SkeneBonk, John-Adam, MD ED   12/23/2011 1522 12/24/2011 1107 Full Code [id number]  Raeford RazorKohut, Stephen, MD ED   11/08/2011 0008 11/08/2011 2324 Full Code [id number]  Ward GivensKnapp, Iva L, MD ED   Advance Care Planning Activity       Prognosis:   Poor prognosis likely weeks if not less due to gastric perforation, decompensated liver cirrhosis, worsening kidney status, severe protein calorie malnutrition. High risk for decompensation at any time.   Discharge Planning:  To Be Determined  Care plan was discussed with RN, patient, NT, [name]  Thank you for allowing the Palliative Medicine Team to assist in the care of this patient.   Time In: 1300 Time Out: 1325 Total Time 25 Prolonged Time Billed no      Greater than 50%  of this time was spent counseling and coordinating care related to the above assessment and plan.  Vennie HomansMegan Evaluna Utke, DNP, FNP-C Palliative Medicine Team  Phone: [phone] Fax: [phone]  Please contact Palliative Medicine Team phone at [phone] for questions and concerns.

## 2019-03-22 DIAGNOSIS — E871 Hypo-osmolality and hyponatremia: Secondary | ICD-10-CM

## 2019-03-22 DIAGNOSIS — D696 Thrombocytopenia, unspecified: Secondary | ICD-10-CM

## 2019-03-22 DIAGNOSIS — I959 Hypotension, unspecified: Secondary | ICD-10-CM

## 2019-03-22 DIAGNOSIS — G9341 Metabolic encephalopathy: Secondary | ICD-10-CM

## 2019-03-22 NOTE — Progress Notes (Signed)
[facility]  PROGRESS NOTE  [name] ZOX:[id number] DOB: 1960-10-13 DOA: 02/08/2019 PCP: Fleet Contras, MD Admit date - 02/08/2019   Admitting Physician Oretha Milch, MD  Outpatient Primary MD for the patient is Fleet Contras, MD  LOS - 42 Brief Narrative   [name] is a 58 y.o. year old male with medical history significant for cirrhosis with splenomegaly, hepatitis C, esophageal varices, adrenal insufficiency, hypothyroidism, schizoaffective disorder, PTSD, CKD stage IIIa who presented on 02/08/2019 with abdominal pain, distention and rectal bleeding.  Admitted with working diagnosis of perforated bowel with pneumoperitoneum.  General surgery recommended nonoperative management IV antibiotics and bowel rest, UTI 11/16 showed contained perforation distal stomach.  Hospital course complicated by patient not having the ability to make decisions as he does not have capacity based on psychiatric evaluation during hospital stay, palliative care has been assisting with his goals of discussions with his friend who are his surrogate medical decision makers.  Main focus now with medical treatment is patient's comfort and happiness with no escalation of care undergoing procedures that will cause pain or prolong suffering without any significant change in outcome.  Hospital course complicated by fairly difficult situation given patient has decompensated liver cirrhosis with ascites a contained perforated bowel/peritoneum complicated further by SBP.  Overall prognosis is very poor given he is not a surgical candidate high risk for mortality during any potential surgery.  Currently quite stable on antifungal treatment.     Subjective  Mr Sasso today keeps repeating "It's good. Don't touch it"  A & P  Guarded prognosis/goals of care discussion His medical decision making surrogates understand his worsening clinical status -Focusing on treatments that enhance his comfort and quality of life  none -two-physician DNR in place -If clinically continues to decline will likely be eligible for transfer to hospice facility   Severe septic shock secondary to perforated bowel/pneumoperitoneum/SBP, stable Patient is not a surgical candidate given extreme medical frailty. Sepsis physiology resolved Have continued conservative management CT abdominal imaging on 12/11 shows decrease in large volume pneumoperitoneum Sepsis physiology resolved SBP Fluid culture (12/11) positive for Candida albicans Empirically on ceftriaxone/Zosyn prior -Fluconazole and ceftriaxone  Acute metabolic encephalopathy, multifactorial etiology Hepatic encephalopathy (ammonia 60, 12/22), and uremic encephalopathy ( BUN 127), metabolic (sodium 811, 12/22). Lethargic, though alert to self and place In setting of PTSD/schizoaffective disorder -has declined further lactulose therapy -Patient does not have capacity to make medical decisions -Has family friends who are surrogate medical decision makers -Cogentin, scheduled Haldol, Zyprexa  Decompensated liver cirrhosis with ascites, setting of hepatitis C Has required multiple paracentesis (11/13, 11/18, 12/11) -Continue fluconazole for Candida albicans SBP-avoid hepatotoxic agents -Paracentesis as needed based on symptoms  AKI on CKD, worsening Previous baseline creatinine 1.8-2 Currently 3.11 Still maintaining urine output Quite elevated BUN, potassium within normal limits, seems to be developing normal anion gap metabolic acidosis 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 -Daily BMP, avoid nephrotoxins  Hypervolemic hyponatremia Currently on fluid restriction, unable to give diuretics given worsening renal function and hypotension  Hypotension, stable Secondary to decompensated cirrhosis BP range 90-100s systolic -Currently on midodrine 5 mg twice daily  Thrombocytopenia, stable In setting of decompensated liver disease -No active signs symptoms of  bleeding -Monitor CBC as needed  Hyperglycemia, improving CBGs on average has been in the 120s without any insulin coverage needed -Continue carefully monitor, will decrease CBG sticks given anxiety to make patient more comfortable  Profound physical debility/ambulatory dysfunction -PT recommends SNF -Patient has no SNF offers, continue  to focus on comfort care at this time          Family Communication  : No family at bedside  Code Status : DNR  Disposition Plan  : Guarded prognosis  Consults  : Palliative care, IR, nephrology, general surgery, psychiatry, GI, ID  Procedures  :  Ultrasound-guided paracentesis, yielding 14 L clear yellow fluid on 02/25/2019 PICC line placed by IR on 02/21/2019 Echocardiogram  DVT Prophylaxis  : SCDs  Lab Results  Component Value Date   PLT 122 (L) 03/20/2019    Diet :  Diet Order            Diet regular Room service appropriate? Yes; Fluid consistency: Thin  Diet effective now               Inpatient Medications Scheduled Meds: . benztropine mesylate  0.5 mg Intramuscular Daily  . Chlorhexidine Gluconate Cloth  6 each Topical Daily  . fluconazole  400 mg Oral Daily  . gabapentin  100 mg Oral QHS  . haloperidol  2 mg Oral Daily  . midodrine  5 mg Oral BID WC  . Muscle Rub   Topical BID  . OLANZapine zydis  5 mg Oral QHS  . pantoprazole  40 mg Oral BID  . sodium chloride flush  10-40 mL Intracatheter Q12H   Continuous Infusions: . sodium chloride    . sodium chloride Stopped (02/12/19 1737)  . sodium chloride Stopped (02/19/19 1422)  . cefTRIAXone (ROCEPHIN)  IV 2 g (03/22/19 0819)   PRN Meds:.Place/Maintain arterial line **AND** sodium chloride, sodium chloride, haloperidol lactate, HYDROmorphone (DILAUDID) injection, lidocaine (PF), lidocaine, ondansetron (ZOFRAN) IV, sodium chloride flush, sodium chloride flush, sodium chloride flush  Antibiotics  :   Anti-infectives (From admission, onward)   Start      Dose/Rate Route Frequency Ordered Stop   03/12/19 1700  fluconazole (DIFLUCAN) tablet 400 mg     400 mg Oral Daily 03/12/19 1654     03/09/19 0800  cefTRIAXone (ROCEPHIN) 2 g in sodium chloride 0.9 % 100 mL IVPB     2 g 200 mL/hr over 30 Minutes Intravenous Every 24 hours 03/09/19 0703     02/08/19 2200  piperacillin-tazobactam (ZOSYN) IVPB 3.375 g  Status:  Discontinued     3.375 g 12.5 mL/hr over 240 Minutes Intravenous Every 8 hours 02/08/19 1552 03/06/19 1638   02/08/19 1545  piperacillin-tazobactam (ZOSYN) IVPB 3.375 g     3.375 g 100 mL/hr over 30 Minutes Intravenous  Once 02/08/19 1533 02/08/19 1840       Objective   Vitals:   03/21/19 0434 03/21/19 1443 03/21/19 2311 03/22/19 0624  BP: 103/63 (!) 89/47 100/65 (!) 89/47  Pulse: 89 78 92 94  Resp: 17  18 18   Temp: 97.7 F (36.5 C) 97.6 F (36.4 C) 97.7 F (36.5 C) 98.2 F (36.8 C)  TempSrc: Oral Oral Oral Oral  SpO2: 99% 98% 97% 98%  Weight:      Height:        SpO2: 98 %  Wt Readings from Last 3 Encounters:  03/19/19 (!) 163.3 kg  01/15/19 99.8 kg  01/07/19 99.8 kg     Intake/Output Summary (Last 24 hours) at 03/22/2019 1014 Last data filed at 03/22/2019 0900 Gross per 24 hour  Intake 120 ml  Output 350 ml  Net -230 ml    Physical Exam:  Chronically ill-appearing male looks older than stated age Awake Alert, Oriented X self SEM loud in LUSB/axilla Soft,  very distended abdomen, non-tender, normal bowel sounds Ostomy bag in place 2+ pitting edema of bilateral lower extremities  No new F.N deficits,     I have personally reviewed the following:   Data Reviewed:  CBC Recent Labs  Lab 03/16/19 0420 03/18/19 0817 03/20/19 1004  WBC 29.2* 24.8* 19.4*  HGB 9.0* 7.8* 7.5*  HCT 28.2* 24.3* 23.7*  PLT 123* PLATELET CLUMPS NOTED ON SMEAR, COUNT APPEARS DECREASED 122*  MCV 94.6 95.3 94.8  MCH 30.2 30.6 30.0  MCHC 31.9 32.1 31.6  RDW 18.8* 18.7* 18.5*    Chemistries  Recent Labs  Lab  03/16/19 0420 03/18/19 0817 03/20/19 1004  NA 126* 127* 127*  K 4.9 4.3 4.6  CL 95* 96* 96*  CO2 22 21* 20*  GLUCOSE 98 124* 135*  BUN 112* 118* 127*  CREATININE 2.72* 2.82* 3.11*  CALCIUM 11.4* 11.4* 10.7*  AST 27 26 33  ALT 28 23 29   ALKPHOS 127* 108 118  BILITOT 0.4 0.3 0.5   ------------------------------------------------------------------------------------------------------------------ No results for input(s): CHOL, HDL, LDLCALC, TRIG, CHOLHDL, LDLDIRECT in the last 72 hours.  Lab Results  Component Value Date   HGBA1C 3.9 (L) 02/09/2019   ------------------------------------------------------------------------------------------------------------------ No results for input(s): TSH, T4TOTAL, T3FREE, THYROIDAB in the last 72 hours.  Invalid input(s): FREET3 ------------------------------------------------------------------------------------------------------------------ No results for input(s): VITAMINB12, FOLATE, FERRITIN, TIBC, IRON, RETICCTPCT in the last 72 hours.  Coagulation profile No results for input(s): INR, PROTIME in the last 168 hours.  No results for input(s): DDIMER in the last 72 hours.  Cardiac Enzymes No results for input(s): CKMB, TROPONINI, MYOGLOBIN in the last 168 hours.  Invalid input(s): CK ------------------------------------------------------------------------------------------------------------------ No results found for: BNP  Micro Results No results found for this or any previous visit (from the past 240 hour(s)).  Radiology Reports CT ABDOMEN PELVIS WO CONTRAST  Result Date: 03/10/2019 CLINICAL DATA:  Sepsis.  Generalized abdominal pain. EXAM: CT ABDOMEN AND PELVIS WITHOUT CONTRAST TECHNIQUE: Multidetector CT imaging of the abdomen and pelvis was performed following the standard protocol without IV contrast. COMPARISON:  March 02, 2019 FINDINGS: Lower chest: There are bilateral pleural effusions that are only partially visualized.  There is atelectasis at the lung bases. The heart size appears to be enlarged. Hepatobiliary: The liver is cirrhotic. Normal gallbladder.There is no biliary ductal dilation. Pancreas: Normal contours without ductal dilatation. No peripancreatic fluid collection. Spleen: The spleen is enlarged. Adrenals/Urinary Tract: --Adrenal glands: No adrenal hemorrhage. --Right kidney/ureter: No hydronephrosis or perinephric hematoma. --Left kidney/ureter: No hydronephrosis or perinephric hematoma. --Urinary bladder: Unremarkable. Stomach/Bowel: --Stomach/Duodenum: Oral contrast is noted within the stomach. --Small bowel: No dilatation or inflammation. --Colon: No focal abnormality. --Appendix: The appendix is not clearly identified. Vascular/Lymphatic: Atherosclerotic calcification is present within the non-aneurysmal abdominal aorta, without hemodynamically significant stenosis. Portosystemic shunting is noted. --No retroperitoneal lymphadenopathy. --No mesenteric lymphadenopathy. --No pelvic or inguinal lymphadenopathy. Reproductive: Unremarkable Other: Extensive pneumoperitoneum is again noted, decreased in volume from the prior study. There is a large volume of abdominal ascites, decreased from prior study. There is new thickening of the peritoneum. There is diffuse mesenteric edema. There is anasarca. Musculoskeletal. There is an old healed fracture deformity of the right proximal hip with subluxation of the femoral head. There is no acute displaced fracture. IMPRESSION: 1. Large volume pneumoperitoneum, decreased from prior study. The exact site of bowel perforation is not identified on this exam. 2. Large volume abdominal free fluid, decreased from prior study. 3. Probable peritonitis, although not well evaluated in the absence of IV  contrast. 4. Anasarca. 5. Bilateral pleural effusions. 6. Cirrhosis with stigmata of portal hypertension. Electronically Signed   By: Katherine Mantle M.D.   On: 03/10/2019 00:38   CT  ABDOMEN PELVIS WO CONTRAST  Result Date: 03/02/2019 CLINICAL DATA:  Inpatient. Worsening abdominal pain and distention. Known distal gastric perforation and pneumoperitoneum. GI bleed. Cirrhosis. EXAM: CT ABDOMEN AND PELVIS WITHOUT CONTRAST TECHNIQUE: Multidetector CT imaging of the abdomen and pelvis was performed following the standard protocol without IV contrast. COMPARISON:  02/17/2019 CT abdomen/pelvis. FINDINGS: Lower chest: Small dependent bilateral pleural effusions, slightly increased bilaterally. Mild dependent right lung base atelectasis. Dense left lower lobe consolidation with air bronchograms, similar. Stable lower esophageal varices. Symmetric stable moderate gynecomastia. Hepatobiliary: Atrophic liver with diffusely irregular liver surface, compatible with cirrhosis. No liver masses. Layering sludge with possible tiny calcified gallstones in the nondistended gallbladder with no gallbladder wall thickening. No biliary ductal dilatation. Pancreas: Normal, with no mass or duct dilation. Spleen: Moderate splenomegaly. Craniocaudal splenic length 19.0 cm. Stable. No splenic mass. Adrenals/Urinary Tract: Normal adrenals. No renal stones. No hydronephrosis. Simple 3.3 cm interpolar right renal cyst. Simple 1.5 cm interpolar left renal cyst. Normal bladder. Stomach/Bowel: Nondistended stomach. Wall thickening in the gastric antrum, unchanged. Irregularity of anterior gastric antrum luminal contour with suggestion of tiny outpouching (series 3/image 46), which does not definitely communicate with large volume anterior peritoneal cavity free air and ascites. Normal caliber small bowel with no small bowel wall thickening. Candidate normal appendix. Scattered fluid levels in the large bowel with no large bowel wall thickening, diverticulosis or acute pericolonic fat stranding. Vascular/Lymphatic: Atherosclerotic nonaneurysmal abdominal aorta. Stable splenic hilar varices. No pathologically enlarged lymph  nodes in the abdomen or pelvis. Reproductive: Normal size prostate. Other: Large volume ascites with large volume pneumoperitoneum, not substantially changed from prior CT. Stable small umbilical hernia containing gas and fluid. Anasarca is similar. Musculoskeletal: Chronic severe right hip deformity with surrounding hypertrophic ossific change. Mild thoracolumbar spondylosis. No new focal osseous lesions. IMPRESSION: 1. Large volume ascites with large volume pneumoperitoneum predominantly in the anterior peritoneal cavity, not substantially changed since 02/17/2019 CT. 2. Persistent nonspecific wall thickening in the gastric antrum. Irregular luminal contour in the anterior gastric antrum compatible with contained perforation seen on 02/12/2019 upper GI study. No obvious direct communication of the gastric lumen with the peritoneal cavity on this limited noncontrast CT study. 3. Cirrhosis.  Stable splenic hilar and lower esophageal varices. 4. Small dependent bilateral pleural effusions, slightly increased bilaterally. Anasarca. 5. Dense left lung base consolidation with air bronchograms, favor atelectasis, with a component of pneumonia not excluded. 6. Fluid levels throughout the large bowel indicative of a nonspecific malabsorptive state. 7.  Aortic Atherosclerosis (ICD10-I70.0). Electronically Signed   By: Delbert Phenix M.D.   On: 03/02/2019 09:23   US Abdomen Complete  Result Date: 03/13/2019 CLINICAL DATA:  Abdominal pain.  Cirrhosis. EXAM: ABDOMEN ULTRASOUND COMPLETE COMPARISON:  Noncontrast CT on 03/09/2019 FINDINGS: Technically difficult evaluation due to patient habitus and bowel gas. Gallbladder: Not visualized. Common bile duct: Diameter: Not visualized. Liver: Diffuse coarsening of hepatic echotexture and capsular nodularity, consistent with cirrhosis. No liver mass identified. Portal vein is patent on color Doppler imaging with normal direction of blood flow towards the liver. IVC: No abnormality  visualized. Pancreas: Not visualized due to overlying bowel gas. Spleen: Not visualized Right Kidney: Length: 11.2 cm. Echogenicity within normal limits. A few simple renal cysts are seen, largest measured 3.3 cm. No mass or hydronephrosis visualized. Left Kidney: Not  visualized due to patient habitus and bowel gas. Abdominal aorta: No proximal abdominal aortic aneurysm visualized. Distal abdominal aorta not visualized due to overlying bowel gas. Other findings: Mild-to-moderate ascites is seen. IMPRESSION: Technically suboptimal exam due to patient habitus and bowel gas. Hepatic cirrhosis and mild-to-moderate ascites. Electronically Signed   By: Danae OrleansJohn A Stahl M.D.   On: 03/13/2019 19:05   US Paracentesis  Result Date: 02/25/2019 INDICATION: Cirrhosis with recurrent ascites. Request for therapeutic paracentesis. EXAM: ULTRASOUND GUIDED RIGHT LOWER QUADRANT PARACENTESIS MEDICATIONS: None. COMPLICATIONS: None immediate. PROCEDURE: Informed written consent was obtained from the patient after a discussion of the risks, benefits and alternatives to treatment. A timeout was performed prior to the initiation of the procedure. Initial ultrasound scanning demonstrates a large amount of ascites within the right lower abdominal quadrant. The right lower abdomen was prepped and draped in the usual sterile fashion. 1% lidocaine was used for local anesthesia. Following this, a 19 gauge, 7-cm, Yueh catheter was introduced. An ultrasound image was saved for documentation purposes. The paracentesis was performed. The catheter was removed and a dressing was applied. The patient tolerated the procedure well without immediate post procedural complication. FINDINGS: A total of approximately 14 L of clear yellow fluid was removed. IMPRESSION: Successful ultrasound-guided paracentesis yielding 14 liters of peritoneal fluid. Read by: Brayton ElKevin Bruning PA-C Electronically Signed   By: Irish LackGlenn  Yamagata M.D.   On: 02/25/2019 15:01   DG Abd  Portable 1V  Result Date: 02/28/2019 CLINICAL DATA:  Abdominal distension, history of cirrhosis EXAM: PORTABLE ABDOMEN - 1 VIEW COMPARISON:  None FINDINGS: Centralization of bowel loops, which may reflect presence of ascites. Bowel gas pattern is otherwise unremarkable. Chronic degenerative changes and heterotopic ossification at the right hip. IMPRESSION: Bowel gas pattern suggestive of ascites. Electronically Signed   By: Guadlupe SpanishPraneil  Patel M.D.   On: 02/28/2019 10:33   ECHOCARDIOGRAM COMPLETE  Result Date: 03/11/2019   ECHOCARDIOGRAM REPORT   Patient Name:   Juliann PulseDNAN Lease Date of Exam: 03/11/2019 Medical Rec #:  [id number]   Height:       78.0 in Accession #:    [id number]  Weight:       260.8 lb Date of Birth:  07/20/1960    BSA:          2.53 m Patient Age:    58 years    BP:           111/62 mmHg Patient Gender: M           HR:           93 bpm. Exam Location:  Inpatient Procedure: 2D Echo Indications:    Murmur 785.2/R01.1  History:        Patient has prior history of Echocardiogram examinations, most                 recent 01/30/2010. Risk Factors:Current Smoker. CKD.  Sonographer:    Ross LudwigArthur Guy RDCS (AE) Referring Phys: [id number] [facility] CareMARYANN MIKHAIL  Sonographer Comments: Suboptimal parasternal window and suboptimal subcostal window. Parasteral long and short axis images suboptimal due to chest wall deformity. IMPRESSIONS  1. Left ventricular ejection fraction, by visual estimation, is 70 to 75%. The left ventricle has hyperdynamic function. There is mildly increased left ventricular hypertrophy.  2. Left ventricular diastolic parameters are consistent with Grade I diastolic dysfunction (impaired relaxation).  3. The left ventricle has no regional wall motion abnormalities.  4. There is SAM of the anterior mitral valve leaflet in the setting of moderate basal  septal hypertrophy and a hyperdynamic LV creating a dynamic LVOT gradient. Difficult Doppler assessment of gradient to seperate from MR signal. The peak  gradient appears to be 65 mmHg.  5. Global right ventricle has normal systolic function.The right ventricular size is normal. No increase in right ventricular wall thickness.  6. Left atrial size was mild-moderately dilated.  7. Right atrial size was mildly dilated.  8. The mitral valve is normal in structure. Mild mitral valve regurgitation. No evidence of mitral stenosis.  9. The tricuspid valve is normal in structure. Tricuspid valve regurgitation is not demonstrated. 10. The aortic valve was not well visualized. Aortic valve regurgitation is not visualized. Diffilcult Doppler assessment of AV in setting of dynamic LVOT gradient. Structurally the valve appears normal, no significant stenosis. The elevated gradient appears to be subvalvular. 11. The pulmonic valve was not well visualized. Pulmonic valve regurgitation is not visualized. FINDINGS  Left Ventricle: Left ventricular ejection fraction, by visual estimation, is 70 to 75%. The left ventricle has hyperdynamic function. The left ventricle has no regional wall motion abnormalities. There is mildly increased left ventricular hypertrophy. Left ventricular diastolic parameters are consistent with Grade I diastolic dysfunction (impaired relaxation). Normal left atrial pressure. There is SAM of the anterior mitral valve leaflet in the setting of moderate basal septal hypertrophy and a hyperdynamic LV creating a dynamic LVOT gradient. Difficult Doppler assessment of gradient to seperate from MR signal. The peak gradient appears to be 65 mmHg. Right Ventricle: The right ventricular size is normal. No increase in right ventricular wall thickness. Global RV systolic function is has normal systolic function. Left Atrium: Left atrial size was mild-moderately dilated. Right Atrium: Right atrial size was mildly dilated Pericardium: There is no evidence of pericardial effusion. Mitral Valve: The mitral valve is normal in structure. Mild mitral valve regurgitation. No  evidence of mitral valve stenosis by observation. MV peak gradient, 9.5 mmHg. Tricuspid Valve: The tricuspid valve is normal in structure. Tricuspid valve regurgitation is not demonstrated. Aortic Valve: The aortic valve was not well visualized. Aortic valve regurgitation is not visualized. Diffilcult Doppler assessment of AV in setting of dynamic LVOT gradient. Structurally the valve appears normal, no significant stenosis. The elevated gradient appears to be subvalvular. Aortic valve mean gradient measures 32.0 mmHg. Aortic valve peak gradient measures 67.4 mmHg. Pulmonic Valve: The pulmonic valve was not well visualized. Pulmonic valve regurgitation is not visualized. Pulmonic regurgitation is not visualized. Aorta: The aortic root is normal in size and structure. Pulmonary Artery: Indeterminant PASP, inadequate TR jet. IAS/Shunts: No atrial level shunt detected by color flow Doppler.  LEFT VENTRICLE PLAX 2D LVIDd:         3.70 cm  Diastology LVIDs:         2.30 cm  LV e' lateral:   10.10 cm/s LV PW:         1.10 cm  LV E/e' lateral: 10.9 LV IVS:        1.10 cm  LV e' medial:    7.83 cm/s LVOT diam:     2.00 cm  LV E/e' medial:  14.0 LV SV:         40 ml LV SV Index:   15.55 LVOT Area:     3.14 cm  RIGHT VENTRICLE RV Basal diam:  2.80 cm RV S prime:     22.90 cm/s TAPSE (M-mode): 2.9 cm LEFT ATRIUM              Index  RIGHT ATRIUM           Index LA diam:        2.60 cm  1.03 cm/m  RA Area:     21.50 cm LA Vol (A2C):   131.0 ml 51.84 ml/m RA Volume:   60.90 ml  24.10 ml/m LA Vol (A4C):   76.6 ml  30.31 ml/m LA Biplane Vol: 100.0 ml 39.57 ml/m  AORTIC VALVE AV Vmax:      410.40 cm/s AV Vmean:     259.800 cm/s AV VTI:       0.743 m AV Peak Grad: 67.4 mmHg AV Mean Grad: 32.0 mmHg  AORTA Ao Root diam: 3.10 cm MITRAL VALVE MV Area (PHT): 1.77 cm              SHUNTS MV Peak grad:  9.5 mmHg              Systemic Diam: 2.00 cm MV Mean grad:  4.0 mmHg MV Vmax:       1.54 m/s MV Vmean:      97.2 cm/s MV VTI:         0.36 m MV PHT:        124.12 msec MV Decel Time: 428 msec MV E velocity: 110.00 cm/s 103 cm/s MV A velocity: 123.00 cm/s 70.3 cm/s MV E/A ratio:  0.89        1.5  Carlyle Dolly MD Electronically signed by Carlyle Dolly MD Signature Date/Time: 03/11/2019/1:58:02 PM    Final    IR PICC PLACEMENT RIGHT >5 YRS INC IMG GUIDE  Result Date: 02/21/2019 CLINICAL DATA:  Gastric perforation. Indwelling PICC line placed for parental nutrition was occluded and now also has been in a vertically completely pulled out by the patient. Request has been made to place a new PICC line. EXAM: POWER PICC LINE PLACEMENT WITH ULTRASOUND AND FLUOROSCOPIC GUIDANCE FLUOROSCOPY TIME:  42 seconds.  2.3 mGy. PROCEDURE: Consent for new PICC line placement was obtained emergently from the admitting service due to the patient's schizoaffective disorder and inability to obtain consent from a family member. The patient was then brought to the angiographic suite for the procedure. A time-out was performed prior to initiating the procedure. The right arm was prepped with chlorhexidine, draped in the usual sterile fashion using maximum barrier technique (cap and mask, sterile gown, sterile gloves, large sterile sheet, hand hygiene and cutaneous antisepsis) and infiltrated locally with 1% Lidocaine. Ultrasound demonstrated patency of the right brachial vein, and this was documented with an image. Under real-time ultrasound guidance, this vein was accessed with a 21 gauge micropuncture needle and image documentation was performed. A 0.018 wire was introduced in to the vein. Over this, a 5.0 [place] dual lumen power injectable PICC was advanced to the lower SVC/right atrial junction. Fluoroscopy during the procedure and fluoro spot radiograph confirms appropriate catheter position. Prolene retention sutures were applied at the catheter exit site. The catheter was flushed and covered with a sterile dressing. Catheter length: 38 cm COMPLICATIONS:  None IMPRESSION: Successful right arm power injectable PICC line placement with ultrasound and fluoroscopic guidance. The catheter is ready for use. Electronically Signed   By: Aletta Edouard M.D.   On: 02/21/2019 18:09   IR Paracentesis  Result Date: 03/09/2019 INDICATION: History of cirrhosis with recurrent ascites. None contain gastric perforation with pneumoperitoneum. Leukocytosis. Request repeat diagnostic and therapeutic paracentesis. EXAM: ULTRASOUND GUIDED LEFT LOWER QUADRANT PARACENTESIS MEDICATIONS: None. COMPLICATIONS: None immediate. PROCEDURE: Informed written consent was obtained  from the patient after a discussion of the risks, benefits and alternatives to treatment. A timeout was performed prior to the initiation of the procedure. Initial ultrasound scanning demonstrates a moderate amount of ascites within the left lower abdominal quadrant. Fluid appears more complex with floating debris on today's exam. The left lower abdomen was prepped and draped in the usual sterile fashion. 1% lidocaine was used for local anesthesia. Following this, a 19 gauge, 7-cm, Yueh catheter was introduced. An ultrasound image was saved for documentation purposes. The paracentesis was performed. The catheter was removed and a dressing was applied. The patient tolerated the procedure well without immediate post procedural complication. FINDINGS: A total of approximately 1 L of thick, turbid, beige/purulent fluid was removed. Samples were sent to the laboratory as requested by the clinical team. IMPRESSION: Successful ultrasound-guided paracentesis yielding 1 liters of peritoneal fluid. Findings were relayed to the ordering physician. Read by: Brayton El PA-C Electronically Signed   By: Gilmer Mor D.O.   On: 03/09/2019 15:41   IR Paracentesis  Result Date: 03/08/2019 INDICATION: Patient with history of cirrhosis and recurrent ascites. Request to IR for therapeutic paracentesis with post procedure albumin  administration. EXAM: ULTRASOUND GUIDED THERAPEUTIC PARACENTESIS MEDICATIONS: 10 mL 1% lidocaine COMPLICATIONS: None immediate. PROCEDURE: Informed written consent was obtained from the patient after a discussion of the risks, benefits and alternatives to treatment. A timeout was performed prior to the initiation of the procedure. Initial ultrasound scanning demonstrates a large amount of ascites within the left lower abdominal quadrant. The left lower abdomen was prepped and draped in the usual sterile fashion. 1% lidocaine was used for local anesthesia. Following this, a 19 gauge, 10 cm, Yueh catheter was introduced. An ultrasound image was saved for documentation purposes. The paracentesis was performed. The catheter was removed and a dressing was applied. The patient tolerated the procedure well without immediate post procedural complication. Patient received post-procedure intravenous albumin; see nursing notes for details. FINDINGS: A total of approximately 9.0 L of clear yellow fluid was removed. IMPRESSION: Successful ultrasound-guided paracentesis yielding 9.0 liters of peritoneal fluid. Read by Lynnette Caffey, PA-C Electronically Signed   By: Richarda Overlie M.D.   On: 03/08/2019 11:04     Time Spent in minutes  30     Laverna Peace M.D on 03/22/2019 at 10:14 AM  To page go to [email or web address] - password [facility]

## 2019-03-22 NOTE — Plan of Care (Signed)
  Problem: Clinical Measurements: Goal: Will remain free from infection Outcome: Progressing Goal: Diagnostic test results will improve Outcome: Progressing Goal: Respiratory complications will improve Outcome: Progressing Goal: Cardiovascular complication will be avoided Outcome: Progressing   Problem: Elimination: Goal: Will not experience complications related to bowel motility Outcome: Progressing Goal: Will not experience complications related to urinary retention Outcome: Progressing   Problem: Pain Managment: Goal: General experience of comfort will improve Outcome: Progressing   Problem: Safety: Goal: Ability to remain free from injury will improve Outcome: Progressing   Problem: Skin Integrity: Goal: Risk for impaired skin integrity will decrease Outcome: Progressing   

## 2019-03-23 DIAGNOSIS — F203 Undifferentiated schizophrenia: Secondary | ICD-10-CM

## 2019-03-23 DIAGNOSIS — R140 Abdominal distension (gaseous): Secondary | ICD-10-CM

## 2019-03-23 DIAGNOSIS — I959 Hypotension, unspecified: Secondary | ICD-10-CM

## 2019-03-23 DIAGNOSIS — D696 Thrombocytopenia, unspecified: Secondary | ICD-10-CM

## 2019-03-23 DIAGNOSIS — E871 Hypo-osmolality and hyponatremia: Secondary | ICD-10-CM

## 2019-03-23 MED ORDER — HYDROMORPHONE HCL 1 MG/ML IJ SOLN
1.0000 mg | INTRAMUSCULAR | Status: DC | PRN
  Administered 2019-03-23 (×2): 1 mg via INTRAVENOUS
  Administered 2019-03-24 – 2019-03-25 (×3): 2 mg via INTRAVENOUS
  Administered 2019-03-26 – 2019-03-27 (×3): 1 mg via INTRAVENOUS
  Filled 2019-03-23: qty 1
  Filled 2019-03-23: qty 2
  Filled 2019-03-23: qty 1
  Filled 2019-03-23: qty 2
  Filled 2019-03-23: qty 1
  Filled 2019-03-23: qty 2
  Filled 2019-03-23 (×2): qty 1

## 2019-03-23 MED ORDER — OLANZAPINE 5 MG PO TBDP
2.5000 mg | ORAL_TABLET | Freq: Every day | ORAL | Status: DC
  Administered 2019-03-23 – 2019-03-26 (×4): 2.5 mg via ORAL
  Filled 2019-03-23 (×5): qty 0.5

## 2019-03-23 NOTE — Progress Notes (Signed)
Palliative follow up.  Visited patient at bedside.  He is calling out.  His speech is demanding but unintelligible (Bosnian?).  This is very different for [name] recently he has been calm and appropriate but this morning it takes a few minutes to calm him down.  He complains of pain all over.    From my previous interactions with [name] I have learned that when he becomes demanding and speaks in broken English his anxiety and emotional illness are exacerbated.  Assessment:   Creatinine is rising (3.11 on 12/22).   Confused and anxious.  Complaining of pain.  Recommendations: Listened and repositioned to distract and calm him. Will add low dose Zyprexa in AM giving him 2.5 mg in morning and 5.0 mg QHS. Will increase the dose range of PRN dilaudid from 1.0 to 1-2 mg for pain/comfort. Per [facility] the goal is to focus on interventions that will make him comfortable and happy.  Plan is to transition to [facility] when appropriate.  PMT will reassess 12/26.  [name], PA-C Palliative Medicine Office:  [phone] 25 min.

## 2019-03-23 NOTE — Care Management (Addendum)
Per [facility] pt has bed offer from [facility] however CM unable to confirm with liaison.  Pt also on IV antibiotics and requiring PRN  IV morphine currently.  TOC will continue to follow   Update:  Attending feels that pt may become appropriate for residential hospice soon - no longer appropriate for SNF.  TOC will continue to follow

## 2019-03-23 NOTE — Progress Notes (Signed)
[facility]  PROGRESS NOTE  [name] LZJ:[id number] DOB: [birth year] DOA: 02/08/2019 PCP: Nolene Ebbs, MD Admit date - 02/08/2019   Admitting Physician Rigoberto Noel, MD  Outpatient Primary MD for the patient is Nolene Ebbs, MD  LOS - 55 Brief Narrative   [name] is a 58 y.o. year old male with medical history significant for cirrhosis with splenomegaly, hepatitis C, esophageal varices, adrenal insufficiency, hypothyroidism, schizoaffective disorder, PTSD, CKD stage IIIa who presented on 02/08/2019 with abdominal pain, distention and rectal bleeding.  Admitted with working diagnosis of perforated bowel with pneumoperitoneum.  General surgery recommended nonoperative management IV antibiotics and bowel rest, UTI 11/16 showed contained perforation distal stomach.  Hospital course complicated by patient not having the ability to make decisions as he does not have capacity based on psychiatric evaluation during hospital stay, palliative care has been assisting with his goals of discussions with his friend who are his surrogate medical decision makers.  Main focus now with medical treatment is patient's comfort and happiness with no escalation of care undergoing procedures that will cause pain or prolong suffering without any significant change in outcome.  Hospital course complicated by fairly difficult situation given patient has decompensated liver cirrhosis with ascites a contained perforated bowel/peritoneum complicated further by SBP.  Overall prognosis is very poor given he is not a surgical candidate high risk for mortality during any potential surgery.  Currently quite stable on antifungal treatment.     Subjective  No events overnight. Asks for Coke during eam  A & P  Guarded prognosis/goals of care discussion His medical decision making surrogates understand his worsening clinical status -Focusing on treatments that enhance his comfort and quality of life  none -two-physician DNR in place -If clinically continues to decline will likely be eligible for transfer to hospice facility --Appreciate palliative care assistance  Severe septic shock secondary to perforated bowel/pneumoperitoneum/SBP, stable Patient is not a surgical candidate given extreme medical frailty. Sepsis physiology resolved Have continued conservative management CT abdominal imaging on 12/11 shows decrease in large volume pneumoperitoneum Sepsis physiology resolved SBP Fluid culture (12/11) positive for Candida albicans Empirically on ceftriaxone/Zosyn prior -Fluconazole and ceftriaxone  Acute metabolic encephalopathy, multifactorial etiology Hepatic encephalopathy (ammonia 60, 12/22), and uremic encephalopathy ( BUN 024), metabolic (sodium 097, 35/32). Lethargic, though alert to self and place In setting of PTSD/schizoaffective disorder --has declined further lactulose therapy -Patient does not have capacity to make medical decisions -Has family friends who are surrogate medical decision makers--spoke with Mrs. 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 -Daily BMP, avoid nephrotoxins  Hypervolemic hyponatremia Currently on fluid restriction, unable to give diuretics given worsening renal function and hypotension --IR paracentesis  Hypotension, stable Secondary to decompensated cirrhosis BP range 99-242A systolic -Currently on midodrine  5 mg twice daily  Thrombocytopenia, stable In setting of decompensated liver disease -No active signs symptoms of bleeding -Monitor CBC as needed  Hyperglycemia, improving CBGs on average has been in the 120s without any insulin coverage needed -Continue carefully monitor, will decrease CBG sticks given anxiety to make patient more comfortable  Profound physical debility/ambulatory dysfunction -PT recommends SNF -Patient has no SNF offers, continue to focus on comfort care at this time          Family Communication  : Spoke to family friend Emi Holes on 12/15. She would like to try another paracentesis for his ascites to see if that improves his comfort  Code Status : DNR  Disposition Plan  : Guarded prognosis, still full scope treatment but no plans to escalate care per surrogate decision makers  Consults  : Palliative care, IR, nephrology, general surgery, psychiatry, GI, ID  Procedures  :  Ultrasound-guided paracentesis, yielding 14 L clear yellow fluid on 02/25/2019 PICC line placed by IR on 02/21/2019 Echocardiogram  DVT Prophylaxis  : SCDs  Lab Results  Component Value Date   PLT 122 (L) 03/20/2019    Diet :  Diet Order            Diet regular Room service appropriate? Yes; Fluid consistency: Thin  Diet effective now               Inpatient Medications Scheduled Meds: . benztropine mesylate  0.5 mg Intramuscular Daily  . Chlorhexidine Gluconate Cloth  6 each Topical Daily  . fluconazole  400 mg Oral Daily  . gabapentin  100 mg Oral QHS  . haloperidol  2 mg Oral Daily  . midodrine  5 mg Oral BID WC  . Muscle Rub   Topical BID  . OLANZapine zydis  2.5 mg Oral Daily  . OLANZapine zydis  5 mg Oral QHS  . pantoprazole  40 mg Oral BID  . sodium chloride flush  10-40 mL Intracatheter Q12H   Continuous Infusions: . sodium chloride    . sodium chloride Stopped (02/12/19 1737)  . sodium chloride Stopped (02/19/19 1422)  . cefTRIAXone (ROCEPHIN)  IV 2 g  (03/23/19 0742)   PRN Meds:.Place/Maintain arterial line **AND** sodium chloride, sodium chloride, haloperidol lactate, HYDROmorphone (DILAUDID) injection, lidocaine (PF), lidocaine, ondansetron (ZOFRAN) IV, sodium chloride flush, sodium chloride flush, sodium chloride flush  Antibiotics  :   Anti-infectives (From admission, onward)   Start     Dose/Rate Route Frequency Ordered Stop   03/12/19 1700  fluconazole (DIFLUCAN) tablet 400 mg     400 mg Oral Daily 03/12/19 1654     03/09/19 0800  cefTRIAXone (ROCEPHIN) 2 g in sodium chloride 0.9 % 100 mL IVPB     2 g 200 mL/hr over 30 Minutes Intravenous Every 24 hours 03/09/19 0703     02/08/19 2200  piperacillin-tazobactam (ZOSYN) IVPB 3.375 g  Status:  Discontinued     3.375 g 12.5 mL/hr over 240 Minutes Intravenous Every 8 hours 02/08/19 1552 03/06/19 1638   02/08/19 1545  piperacillin-tazobactam (ZOSYN) IVPB 3.375 g     3.375 g 100 mL/hr over 30 Minutes Intravenous  Once 02/08/19 1533 02/08/19 1840       Objective   Vitals:   03/22/19 2059 03/23/19 0619 03/23/19 1101 03/23/19 1448  BP: (!) 90/51 (!) 71/63 (!) 88/57 (!) 90/59  Pulse: 87 94 93 92  Resp: 14 18  20   Temp: 98.3 F (36.8 C) 97.7 F (36.5 C)  97.8 F (36.6 C)  TempSrc: Oral Oral  Oral  SpO2: 97% 98% 99% 98%  Weight:  (!) 167.8 kg    Height:        SpO2: 98 %  Wt Readings from Last 3 Encounters:  03/23/19 (!) 167.8 kg  01/15/19 99.8 kg  01/07/19 99.8 kg  Intake/Output Summary (Last 24 hours) at 03/23/2019 1611 Last data filed at 03/23/2019 0700 Gross per 24 hour  Intake 850 ml  Output 275 ml  Net 575 ml    Physical Exam:  Chronically ill-appearing male looks older than stated age Awake Alert, Oriented X self SEM loud in LUSB/axilla Soft, very distended abdomen, non-tender, normal bowel sounds Ostomy bag in place 2+ pitting edema of bilateral lower extremities  No new F.N deficits,     I have personally reviewed the following:   Data  Reviewed:  CBC Recent Labs  Lab 03/18/19 0817 03/20/19 1004  WBC 24.8* 19.4*  HGB 7.8* 7.5*  HCT 24.3* 23.7*  PLT PLATELET CLUMPS NOTED ON SMEAR, COUNT APPEARS DECREASED 122*  MCV 95.3 94.8  MCH 30.6 30.0  MCHC 32.1 31.6  RDW 18.7* 18.5*    Chemistries  Recent Labs  Lab 03/18/19 0817 03/20/19 1004  NA 127* 127*  K 4.3 4.6  CL 96* 96*  CO2 21* 20*  GLUCOSE 124* 135*  BUN 118* 127*  CREATININE 2.82* 3.11*  CALCIUM 11.4* 10.7*  AST 26 33  ALT 23 29  ALKPHOS 108 118  BILITOT 0.3 0.5   ------------------------------------------------------------------------------------------------------------------ No results for input(s): CHOL, HDL, LDLCALC, TRIG, CHOLHDL, LDLDIRECT in the last 72 hours.  Lab Results  Component Value Date   HGBA1C 3.9 (L) 02/09/2019   ------------------------------------------------------------------------------------------------------------------ No results for input(s): TSH, T4TOTAL, T3FREE, THYROIDAB in the last 72 hours.  Invalid input(s): FREET3 ------------------------------------------------------------------------------------------------------------------ No results for input(s): VITAMINB12, FOLATE, FERRITIN, TIBC, IRON, RETICCTPCT in the last 72 hours.  Coagulation profile No results for input(s): INR, PROTIME in the last 168 hours.  No results for input(s): DDIMER in the last 72 hours.  Cardiac Enzymes No results for input(s): CKMB, TROPONINI, MYOGLOBIN in the last 168 hours.  Invalid input(s): CK ------------------------------------------------------------------------------------------------------------------ No results found for: BNP  Micro Results No results found for this or any previous visit (from the past 240 hour(s)).  Radiology Reports CT ABDOMEN PELVIS WO CONTRAST  Result Date: 03/10/2019 CLINICAL DATA:  Sepsis.  Generalized abdominal pain. EXAM: CT ABDOMEN AND PELVIS WITHOUT CONTRAST TECHNIQUE: Multidetector CT  imaging of the abdomen and pelvis was performed following the standard protocol without IV contrast. COMPARISON:  March 02, 2019 FINDINGS: Lower chest: There are bilateral pleural effusions that are only partially visualized. There is atelectasis at the lung bases. The heart size appears to be enlarged. Hepatobiliary: The liver is cirrhotic. Normal gallbladder.There is no biliary ductal dilation. Pancreas: Normal contours without ductal dilatation. No peripancreatic fluid collection. Spleen: The spleen is enlarged. Adrenals/Urinary Tract: --Adrenal glands: No adrenal hemorrhage. --Right kidney/ureter: No hydronephrosis or perinephric hematoma. --Left kidney/ureter: No hydronephrosis or perinephric hematoma. --Urinary bladder: Unremarkable. Stomach/Bowel: --Stomach/Duodenum: Oral contrast is noted within the stomach. --Small bowel: No dilatation or inflammation. --Colon: No focal abnormality. --Appendix: The appendix is not clearly identified. Vascular/Lymphatic: Atherosclerotic calcification is present within the non-aneurysmal abdominal aorta, without hemodynamically significant stenosis. Portosystemic shunting is noted. --No retroperitoneal lymphadenopathy. --No mesenteric lymphadenopathy. --No pelvic or inguinal lymphadenopathy. Reproductive: Unremarkable Other: Extensive pneumoperitoneum is again noted, decreased in volume from the prior study. There is a large volume of abdominal ascites, decreased from prior study. There is new thickening of the peritoneum. There is diffuse mesenteric edema. There is anasarca. Musculoskeletal. There is an old healed fracture deformity of the right proximal hip with subluxation of the femoral head. There is no acute displaced fracture. IMPRESSION: 1. Large volume pneumoperitoneum, decreased from prior study. The exact site of  bowel perforation is not identified on this exam. 2. Large volume abdominal free fluid, decreased from prior study. 3. Probable peritonitis, although  not well evaluated in the absence of IV contrast. 4. Anasarca. 5. Bilateral pleural effusions. 6. Cirrhosis with stigmata of portal hypertension. Electronically Signed   By: Katherine Mantle M.D.   On: 03/10/2019 00:38   CT ABDOMEN PELVIS WO CONTRAST  Result Date: 03/02/2019 CLINICAL DATA:  Inpatient. Worsening abdominal pain and distention. Known distal gastric perforation and pneumoperitoneum. GI bleed. Cirrhosis. EXAM: CT ABDOMEN AND PELVIS WITHOUT CONTRAST TECHNIQUE: Multidetector CT imaging of the abdomen and pelvis was performed following the standard protocol without IV contrast. COMPARISON:  02/17/2019 CT abdomen/pelvis. FINDINGS: Lower chest: Small dependent bilateral pleural effusions, slightly increased bilaterally. Mild dependent right lung base atelectasis. Dense left lower lobe consolidation with air bronchograms, similar. Stable lower esophageal varices. Symmetric stable moderate gynecomastia. Hepatobiliary: Atrophic liver with diffusely irregular liver surface, compatible with cirrhosis. No liver masses. Layering sludge with possible tiny calcified gallstones in the nondistended gallbladder with no gallbladder wall thickening. No biliary ductal dilatation. Pancreas: Normal, with no mass or duct dilation. Spleen: Moderate splenomegaly. Craniocaudal splenic length 19.0 cm. Stable. No splenic mass. Adrenals/Urinary Tract: Normal adrenals. No renal stones. No hydronephrosis. Simple 3.3 cm interpolar right renal cyst. Simple 1.5 cm interpolar left renal cyst. Normal bladder. Stomach/Bowel: Nondistended stomach. Wall thickening in the gastric antrum, unchanged. Irregularity of anterior gastric antrum luminal contour with suggestion of tiny outpouching (series 3/image 46), which does not definitely communicate with large volume anterior peritoneal cavity free air and ascites. Normal caliber small bowel with no small bowel wall thickening. Candidate normal appendix. Scattered fluid levels in the large  bowel with no large bowel wall thickening, diverticulosis or acute pericolonic fat stranding. Vascular/Lymphatic: Atherosclerotic nonaneurysmal abdominal aorta. Stable splenic hilar varices. No pathologically enlarged lymph nodes in the abdomen or pelvis. Reproductive: Normal size prostate. Other: Large volume ascites with large volume pneumoperitoneum, not substantially changed from prior CT. Stable small umbilical hernia containing gas and fluid. Anasarca is similar. Musculoskeletal: Chronic severe right hip deformity with surrounding hypertrophic ossific change. Mild thoracolumbar spondylosis. No new focal osseous lesions. IMPRESSION: 1. Large volume ascites with large volume pneumoperitoneum predominantly in the anterior peritoneal cavity, not substantially changed since 02/17/2019 CT. 2. Persistent nonspecific wall thickening in the gastric antrum. Irregular luminal contour in the anterior gastric antrum compatible with contained perforation seen on 02/12/2019 upper GI study. No obvious direct communication of the gastric lumen with the peritoneal cavity on this limited noncontrast CT study. 3. Cirrhosis.  Stable splenic hilar and lower esophageal varices. 4. Small dependent bilateral pleural effusions, slightly increased bilaterally. Anasarca. 5. Dense left lung base consolidation with air bronchograms, favor atelectasis, with a component of pneumonia not excluded. 6. Fluid levels throughout the large bowel indicative of a nonspecific malabsorptive state. 7.  Aortic Atherosclerosis (ICD10-I70.0). Electronically Signed   By: Delbert Phenix M.D.   On: 03/02/2019 09:23   US Abdomen Complete  Result Date: 03/13/2019 CLINICAL DATA:  Abdominal pain.  Cirrhosis. EXAM: ABDOMEN ULTRASOUND COMPLETE COMPARISON:  Noncontrast CT on 03/09/2019 FINDINGS: Technically difficult evaluation due to patient habitus and bowel gas. Gallbladder: Not visualized. Common bile duct: Diameter: Not visualized. Liver: Diffuse coarsening  of hepatic echotexture and capsular nodularity, consistent with cirrhosis. No liver mass identified. Portal vein is patent on color Doppler imaging with normal direction of blood flow towards the liver. IVC: No abnormality visualized. Pancreas: Not visualized due to overlying bowel gas. Spleen:  Not visualized Right Kidney: Length: 11.2 cm. Echogenicity within normal limits. A few simple renal cysts are seen, largest measured 3.3 cm. No mass or hydronephrosis visualized. Left Kidney: Not visualized due to patient habitus and bowel gas. Abdominal aorta: No proximal abdominal aortic aneurysm visualized. Distal abdominal aorta not visualized due to overlying bowel gas. Other findings: Mild-to-moderate ascites is seen. IMPRESSION: Technically suboptimal exam due to patient habitus and bowel gas. Hepatic cirrhosis and mild-to-moderate ascites. Electronically Signed   By: Danae Orleans M.D.   On: 03/13/2019 19:05   US Paracentesis  Result Date: 02/25/2019 INDICATION: Cirrhosis with recurrent ascites. Request for therapeutic paracentesis. EXAM: ULTRASOUND GUIDED RIGHT LOWER QUADRANT PARACENTESIS MEDICATIONS: None. COMPLICATIONS: None immediate. PROCEDURE: Informed written consent was obtained from the patient after a discussion of the risks, benefits and alternatives to treatment. A timeout was performed prior to the initiation of the procedure. Initial ultrasound scanning demonstrates a large amount of ascites within the right lower abdominal quadrant. The right lower abdomen was prepped and draped in the usual sterile fashion. 1% lidocaine was used for local anesthesia. Following this, a 19 gauge, 7-cm, Yueh catheter was introduced. An ultrasound image was saved for documentation purposes. The paracentesis was performed. The catheter was removed and a dressing was applied. The patient tolerated the procedure well without immediate post procedural complication. FINDINGS: A total of approximately 14 L of clear yellow  fluid was removed. IMPRESSION: Successful ultrasound-guided paracentesis yielding 14 liters of peritoneal fluid. Read by: Brayton El PA-C Electronically Signed   By: Irish Lack M.D.   On: 02/25/2019 15:01   DG Abd Portable 1V  Result Date: 02/28/2019 CLINICAL DATA:  Abdominal distension, history of cirrhosis EXAM: PORTABLE ABDOMEN - 1 VIEW COMPARISON:  None FINDINGS: Centralization of bowel loops, which may reflect presence of ascites. Bowel gas pattern is otherwise unremarkable. Chronic degenerative changes and heterotopic ossification at the right hip. IMPRESSION: Bowel gas pattern suggestive of ascites. Electronically Signed   By: Guadlupe Spanish M.D.   On: 02/28/2019 10:33   ECHOCARDIOGRAM COMPLETE  Result Date: 03/11/2019   ECHOCARDIOGRAM REPORT   Patient Name:   DONTAVIOUS EMILY Date of Exam: 03/11/2019 Medical Rec #:  [id number]   Height:       78.0 in Accession #:    [id number]  Weight:       260.8 lb Date of Birth:  1961-03-29    BSA:          2.53 m Patient Age:    58 years    BP:           111/62 mmHg Patient Gender: M           HR:           93 bpm. Exam Location:  Inpatient Procedure: 2D Echo Indications:    Murmur 785.2/R01.1  History:        Patient has prior history of Echocardiogram examinations, most                 recent 01/30/2010. Risk Factors:Current Smoker. CKD.  Sonographer:    Ross Ludwig RDCS (AE) Referring Phys: [id number] [facility]  Sonographer Comments: Suboptimal parasternal window and suboptimal subcostal window. Parasteral long and short axis images suboptimal due to chest wall deformity. IMPRESSIONS  1. Left ventricular ejection fraction, by visual estimation, is 70 to 75%. The left ventricle has hyperdynamic function. There is mildly increased left ventricular hypertrophy.  2. Left ventricular diastolic parameters are consistent with Grade I diastolic dysfunction (  impaired relaxation).  3. The left ventricle has no regional wall motion abnormalities.  4. There is SAM of  the anterior mitral valve leaflet in the setting of moderate basal septal hypertrophy and a hyperdynamic LV creating a dynamic LVOT gradient. Difficult Doppler assessment of gradient to seperate from MR signal. The peak gradient appears to be 65 mmHg.  5. Global right ventricle has normal systolic function.The right ventricular size is normal. No increase in right ventricular wall thickness.  6. Left atrial size was mild-moderately dilated.  7. Right atrial size was mildly dilated.  8. The mitral valve is normal in structure. Mild mitral valve regurgitation. No evidence of mitral stenosis.  9. The tricuspid valve is normal in structure. Tricuspid valve regurgitation is not demonstrated. 10. The aortic valve was not well visualized. Aortic valve regurgitation is not visualized. Diffilcult Doppler assessment of AV in setting of dynamic LVOT gradient. Structurally the valve appears normal, no significant stenosis. The elevated gradient appears to be subvalvular. 11. The pulmonic valve was not well visualized. Pulmonic valve regurgitation is not visualized. FINDINGS  Left Ventricle: Left ventricular ejection fraction, by visual estimation, is 70 to 75%. The left ventricle has hyperdynamic function. The left ventricle has no regional wall motion abnormalities. There is mildly increased left ventricular hypertrophy. Left ventricular diastolic parameters are consistent with Grade I diastolic dysfunction (impaired relaxation). Normal left atrial pressure. There is SAM of the anterior mitral valve leaflet in the setting of moderate basal septal hypertrophy and a hyperdynamic LV creating a dynamic LVOT gradient. Difficult Doppler assessment of gradient to seperate from MR signal. The peak gradient appears to be 65 mmHg. Right Ventricle: The right ventricular size is normal. No increase in right ventricular wall thickness. Global RV systolic function is has normal systolic function. Left Atrium: Left atrial size was  mild-moderately dilated. Right Atrium: Right atrial size was mildly dilated Pericardium: There is no evidence of pericardial effusion. Mitral Valve: The mitral valve is normal in structure. Mild mitral valve regurgitation. No evidence of mitral valve stenosis by observation. MV peak gradient, 9.5 mmHg. Tricuspid Valve: The tricuspid valve is normal in structure. Tricuspid valve regurgitation is not demonstrated. Aortic Valve: The aortic valve was not well visualized. Aortic valve regurgitation is not visualized. Diffilcult Doppler assessment of AV in setting of dynamic LVOT gradient. Structurally the valve appears normal, no significant stenosis. The elevated gradient appears to be subvalvular. Aortic valve mean gradient measures 32.0 mmHg. Aortic valve peak gradient measures 67.4 mmHg. Pulmonic Valve: The pulmonic valve was not well visualized. Pulmonic valve regurgitation is not visualized. Pulmonic regurgitation is not visualized. Aorta: The aortic root is normal in size and structure. Pulmonary Artery: Indeterminant PASP, inadequate TR jet. IAS/Shunts: No atrial level shunt detected by color flow Doppler.  LEFT VENTRICLE PLAX 2D LVIDd:         3.70 cm  Diastology LVIDs:         2.30 cm  LV e' lateral:   10.10 cm/s LV PW:         1.10 cm  LV E/e' lateral: 10.9 LV IVS:        1.10 cm  LV e' medial:    7.83 cm/s LVOT diam:     2.00 cm  LV E/e' medial:  14.0 LV SV:         40 ml LV SV Index:   15.55 LVOT Area:     3.14 cm  RIGHT VENTRICLE RV Basal diam:  2.80 cm RV S prime:  22.90 cm/s TAPSE (M-mode): 2.9 cm LEFT ATRIUM              Index       RIGHT ATRIUM           Index LA diam:        2.60 cm  1.03 cm/m  RA Area:     21.50 cm LA Vol (A2C):   131.0 ml 51.84 ml/m RA Volume:   60.90 ml  24.10 ml/m LA Vol (A4C):   76.6 ml  30.31 ml/m LA Biplane Vol: 100.0 ml 39.57 ml/m  AORTIC VALVE AV Vmax:      410.40 cm/s AV Vmean:     259.800 cm/s AV VTI:       0.743 m AV Peak Grad: 67.4 mmHg AV Mean Grad: 32.0 mmHg   AORTA Ao Root diam: 3.10 cm MITRAL VALVE MV Area (PHT): 1.77 cm              SHUNTS MV Peak grad:  9.5 mmHg              Systemic Diam: 2.00 cm MV Mean grad:  4.0 mmHg MV Vmax:       1.54 m/s MV Vmean:      97.2 cm/s MV VTI:        0.36 m MV PHT:        124.12 msec MV Decel Time: 428 msec MV E velocity: 110.00 cm/s 103 cm/s MV A velocity: 123.00 cm/s 70.3 cm/s MV E/A ratio:  0.89        1.5  Dina Rich MD Electronically signed by Dina Rich MD Signature Date/Time: 03/11/2019/1:58:02 PM    Final    IR PICC PLACEMENT RIGHT >5 YRS INC IMG GUIDE  Result Date: 02/21/2019 CLINICAL DATA:  Gastric perforation. Indwelling PICC line placed for parental nutrition was occluded and now also has been in a vertically completely pulled out by the patient. Request has been made to place a new PICC line. EXAM: POWER PICC LINE PLACEMENT WITH ULTRASOUND AND FLUOROSCOPIC GUIDANCE FLUOROSCOPY TIME:  42 seconds.  2.3 mGy. PROCEDURE: Consent for new PICC line placement was obtained emergently from the admitting service due to the patient's schizoaffective disorder and inability to obtain consent from a family member. The patient was then brought to the angiographic suite for the procedure. A time-out was performed prior to initiating the procedure. The right arm was prepped with chlorhexidine, draped in the usual sterile fashion using maximum barrier technique (cap and mask, sterile gown, sterile gloves, large sterile sheet, hand hygiene and cutaneous antisepsis) and infiltrated locally with 1% Lidocaine. Ultrasound demonstrated patency of the right brachial vein, and this was documented with an image. Under real-time ultrasound guidance, this vein was accessed with a 21 gauge micropuncture needle and image documentation was performed. A 0.018 wire was introduced in to the vein. Over this, a 5.0 [place] dual lumen power injectable PICC was advanced to the lower SVC/right atrial junction. Fluoroscopy during the procedure and  fluoro spot radiograph confirms appropriate catheter position. Prolene retention sutures were applied at the catheter exit site. The catheter was flushed and covered with a sterile dressing. Catheter length: 38 cm COMPLICATIONS: None IMPRESSION: Successful right arm power injectable PICC line placement with ultrasound and fluoroscopic guidance. The catheter is ready for use. Electronically Signed   By: Irish Lack M.D.   On: 02/21/2019 18:09   IR Paracentesis  Result Date: 03/09/2019 INDICATION: History of cirrhosis with recurrent ascites. None contain gastric  perforation with pneumoperitoneum. Leukocytosis. Request repeat diagnostic and therapeutic paracentesis. EXAM: ULTRASOUND GUIDED LEFT LOWER QUADRANT PARACENTESIS MEDICATIONS: None. COMPLICATIONS: None immediate. PROCEDURE: Informed written consent was obtained from the patient after a discussion of the risks, benefits and alternatives to treatment. A timeout was performed prior to the initiation of the procedure. Initial ultrasound scanning demonstrates a moderate amount of ascites within the left lower abdominal quadrant. Fluid appears more complex with floating debris on today's exam. The left lower abdomen was prepped and draped in the usual sterile fashion. 1% lidocaine was used for local anesthesia. Following this, a 19 gauge, 7-cm, Yueh catheter was introduced. An ultrasound image was saved for documentation purposes. The paracentesis was performed. The catheter was removed and a dressing was applied. The patient tolerated the procedure well without immediate post procedural complication. FINDINGS: A total of approximately 1 L of thick, turbid, beige/purulent fluid was removed. Samples were sent to the laboratory as requested by the clinical team. IMPRESSION: Successful ultrasound-guided paracentesis yielding 1 liters of peritoneal fluid. Findings were relayed to the ordering physician. Read by: Brayton ElKevin Bruning PA-C Electronically Signed   By:  Gilmer MorJaime  Wagner D.O.   On: 03/09/2019 15:41   IR Paracentesis  Result Date: 03/08/2019 INDICATION: Patient with history of cirrhosis and recurrent ascites. Request to IR for therapeutic paracentesis with post procedure albumin administration. EXAM: ULTRASOUND GUIDED THERAPEUTIC PARACENTESIS MEDICATIONS: 10 mL 1% lidocaine COMPLICATIONS: None immediate. PROCEDURE: Informed written consent was obtained from the patient after a discussion of the risks, benefits and alternatives to treatment. A timeout was performed prior to the initiation of the procedure. Initial ultrasound scanning demonstrates a large amount of ascites within the left lower abdominal quadrant. The left lower abdomen was prepped and draped in the usual sterile fashion. 1% lidocaine was used for local anesthesia. Following this, a 19 gauge, 10 cm, Yueh catheter was introduced. An ultrasound image was saved for documentation purposes. The paracentesis was performed. The catheter was removed and a dressing was applied. The patient tolerated the procedure well without immediate post procedural complication. Patient received post-procedure intravenous albumin; see nursing notes for details. FINDINGS: A total of approximately 9.0 L of clear yellow fluid was removed. IMPRESSION: Successful ultrasound-guided paracentesis yielding 9.0 liters of peritoneal fluid. Read by Lynnette CaffeyShannon Watterson, PA-C Electronically Signed   By: Richarda OverlieAdam  Henn M.D.   On: 03/08/2019 11:04     Time Spent in minutes  30     Laverna PeaceShayla D Keamber Macfadden M.D on 03/23/2019 at 4:11 PM  To page go to [email or web address] - password [email or web address]

## 2019-03-24 ENCOUNTER — Inpatient Hospital Stay (HOSPITAL_COMMUNITY): Payer: Medicaid Other

## 2019-03-24 LAB — BASIC METABOLIC PANEL
Anion gap: 10 (ref 5–15)
BUN: 143 mg/dL — ABNORMAL HIGH (ref 6–20)
CO2: 18 mmol/L — ABNORMAL LOW (ref 22–32)
Calcium: 9.6 mg/dL (ref 8.9–10.3)
Chloride: 96 mmol/L — ABNORMAL LOW (ref 98–111)
Creatinine, Ser: 4.05 mg/dL — ABNORMAL HIGH (ref 0.61–1.24)
GFR calc Af Amer: 18 mL/min — ABNORMAL LOW (ref >60)
GFR calc non Af Amer: 15 mL/min — ABNORMAL LOW (ref >60)
Glucose, Bld: 87 mg/dL (ref 70–99)
Potassium: 5.1 mmol/L (ref 3.5–5.1)
Sodium: 124 mmol/L — ABNORMAL LOW (ref 135–145)

## 2019-03-24 LAB — CBC
HCT: 20.9 % — ABNORMAL LOW (ref 39.0–52.0)
Hemoglobin: 6.7 g/dL — CL (ref 13.0–17.0)
MCH: 29.4 pg (ref 26.0–34.0)
MCHC: 32.1 g/dL (ref 30.0–36.0)
MCV: 91.7 fL (ref 80.0–100.0)
Platelets: 150 10*3/uL (ref 150–400)
RBC: 2.28 MIL/uL — ABNORMAL LOW (ref 4.22–5.81)
RDW: 18.6 % — ABNORMAL HIGH (ref 11.5–15.5)
WBC: 18.8 10*3/uL — ABNORMAL HIGH (ref 4.0–10.5)
nRBC: 0 % (ref 0.0–0.2)

## 2019-03-24 LAB — PREPARE RBC (CROSSMATCH)

## 2019-03-24 MED ORDER — FENTANYL 25 MCG/HR TD PT72
1.0000 | MEDICATED_PATCH | TRANSDERMAL | Status: DC
  Administered 2019-03-24 – 2019-03-27 (×2): 1 via TRANSDERMAL
  Filled 2019-03-24 (×2): qty 1

## 2019-03-24 MED ORDER — LIDOCAINE HCL (PF) 1 % IJ SOLN
INTRAMUSCULAR | Status: AC
  Filled 2019-03-24: qty 30

## 2019-03-24 MED ORDER — SODIUM CHLORIDE 0.9% IV SOLUTION: Freq: Once | INTRAVENOUS | Status: DC

## 2019-03-24 NOTE — Procedures (Signed)
PROCEDURE SUMMARY:  Successful image-guided paracentesis from the right lower abdomen.  Yielded 4.8 liters of opaque, light yellow fluid - procedure was aborted at this amount due to significant hypotension. Residual fluid remains on post procedure [place].  No immediate complications.  EBL: zero Patient tolerated well.   Specimen was not sent for labs.  Please see imaging section of Epic for full dictation.  Joaquim Nam PA-C 03/24/2019 10:04 AM

## 2019-03-24 NOTE — Progress Notes (Addendum)
Patient seen at bedside.  He is just back from paracentesis - which was aborted after 4.8L was removed due to hypotension.  He is calmly repeating a phrase again and again that I can not understand.  He has turned his lunch plate over on his stomach (fortunately the lid remained on).  RN at bedside.  She is calling for assistance to clean him up.  We discussed probable need for pain and anxiety medication.  Called [name].  She states that when they visited on Thursday 12/24 they noticed his mental status had really declined.  They stated he would repeat a phrase over and over again.  She understands he is nearing EOL and asked to visit him in the next day or two.  I explained that his kidneys are failing and she stated "the toxins are building up in him".    I explained that I would likely call her in a day or two to explain we were making a hospice referral.  She agreed.  Assessment: Creatinine is climbing.   Mental status is waning likely due to uremia.  Recommendation:   Please do not escalate care.  We need to do what we can to care for him and ensure his comfort. Will add fentanyl patch to help ensure comfort and not harm the kidneys.  [name], PA-C Palliative Medicine Office:  [phone]  25 min

## 2019-03-24 NOTE — Progress Notes (Signed)
[facility]  PROGRESS NOTE  [name] AOZ:[id number] DOB: 1960/08/28 DOA: 02/08/2019 PCP: Benjamin Ebbs, MD Admit date - 02/08/2019   Admitting Physician Rigoberto Noel, MD  Outpatient Primary MD for the patient is Benjamin Ebbs, MD  LOS - 43 Brief Narrative   [name] is a 58 y.o. year old male with medical history significant for cirrhosis with splenomegaly, hepatitis C, esophageal varices, adrenal insufficiency, hypothyroidism, schizoaffective disorder, PTSD, CKD stage IIIa who presented on 02/08/2019 with abdominal pain, distention and rectal bleeding.  Admitted with working diagnosis of perforated bowel with pneumoperitoneum.  General surgery recommended nonoperative management IV antibiotics and bowel rest, UTI 11/16 showed contained perforation distal stomach.  Hospital course complicated by patient not having the ability to make decisions as he does not have capacity based on psychiatric evaluation during hospital stay, palliative care has been assisting with his goals of discussions with his friend who are his surrogate medical decision makers.  Main focus now with medical treatment is patient's comfort and happiness with no escalation of care undergoing procedures that will cause pain or prolong suffering without any significant change in outcome.  Hospital course complicated by fairly difficult situation given patient has decompensated liver cirrhosis with ascites a contained perforated bowel/peritoneum complicated further by SBP.  Overall prognosis is very poor given he is not a surgical candidate high risk for mortality during any potential surgery.  Currently quite stable on antifungal treatment.     Subjective  No events overnight. Went to get paracentesis this morning  A & P  Guarded prognosis/goals of care discussion His medical decision making surrogates understand his worsening clinical status -Focusing on treatments that enhance his comfort and quality of life  no escalation of medical care per decision maker wishes -two-physician DNR in place -If clinically continues to decline will likely be eligible for transfer to hospice facility --Appreciate palliative care assistance  Severe septic shock secondary to perforated bowel/pneumoperitoneum/SBP, stable Patient is not a surgical candidate given extreme medical frailty. Sepsis physiology resolved Have continued conservative management CT abdominal imaging on 12/11 shows decrease in large volume pneumoperitoneum Sepsis physiology resolved SBP Fluid culture (12/11) positive for Candida albicans Empirically on ceftriaxone/Zosyn prior -Fluconazole and ceftriaxone  Acute metabolic encephalopathy, multifactorial etiology, worsening Hepatic encephalopathy (ammonia 60, 12/22), and uremic encephalopathy ( BUN 962), metabolic (sodium 952, 84/13). Lethargic, though alert to self  In setting of PTSD/schizoaffective disorder --has declined further lactulose therapy -Patient does not have capacity to make medical decisions -Has family friends who are surrogate medical decision makers--spoke with Mrs. [name] on 12/25 who understand he is reaching end of life and does not want to escalate care - Continue current treatment but patient will likely be hospice candidate -Cogentin, scheduled Haldol, Zyprexa  Decompensated liver cirrhosis with ascites, in setting of hepatitis C Has required multiple paracentesis (11/13, 11/18, 12/11, 12/26) Last paracentesis ~ only 4 L removed due to hypotension -Continue fluconazole for Candida albicans SBP-avoid hepatotoxic agents -Paracentesis as needed based on symptoms, IR consulted for repeat  AKI on CKD, worsening Previous baseline creatinine 1.8-2 Currently~ 4 Quite elevated BUN, potassium within normal limits, seems to be developing normal anion gap metabolic acidosis 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 -Decrease frequency of lab checks  Acute on chronic anemia,  normocytic, worsening Hgb of 6.7 today. Has been steadily declining.  -family does not want to escalate care -no blood transfusion -monitoring for pain and discomfort  Hypervolemic hyponatremia, worsening Currently on fluid restriction, unable to give diuretics given  worsening renal function and hypotension --IR paracentesis today ( ~4 L removed) limited by hypotension  Hypotension, stable Secondary to decompensated cirrhosis BP range 90-100s systolic -Currently on midodrine 5 mg twice daily  Thrombocytopenia, stable In setting of decompensated liver disease -No active signs symptoms of bleeding -Limiting blood draws per family wishes  Hyperglycemia, improving CBGs on average has been in the 120s without any insulin coverage needed -Dc CBG sticks given desire to make patient more comfortable given poor prognosis and end of life care  Profound physical debility/ambulatory dysfunction -PT recommends SNF -Patient has no SNF offers, continue to focus on comfort care at this time          Family Communication  : Spoke to family friend [place] on 12/15. She would like to try another paracentesis for his ascites to see if that improves his comfort  Code Status : DNR  Disposition Plan  : very Guarded prognosis, still full scope treatment but no plans to escalate care per surrogate decision makers, he is approaching end of life given worsening hyponatremia, anemia, confusino, ureamia, kidney dysfunction in setting of pneumoperitoneum and liver dysfunction. Will likely meet criteria for hospice care soon  Consults  : Palliative care, IR, nephrology, general surgery, psychiatry, GI, ID  Procedures  :  Ultrasound-guided paracentesis, yielding 14 L clear yellow fluid on 02/25/2019 PICC line placed by IR on 02/21/2019 Echocardiogram  DVT Prophylaxis  : SCDs  Lab Results  Component Value Date   PLT 150 03/24/2019    Diet :  Diet Order            Diet regular Room service  appropriate? Yes; Fluid consistency: Thin  Diet effective now               Inpatient Medications Scheduled Meds:  sodium chloride   Intravenous Once   benztropine mesylate  0.5 mg Intramuscular Daily   Chlorhexidine Gluconate Cloth  6 each Topical Daily   fentaNYL  1 patch Transdermal Q72H   fluconazole  400 mg Oral Daily   gabapentin  100 mg Oral QHS   haloperidol  2 mg Oral Daily   lidocaine (PF)       Muscle Rub   Topical BID   OLANZapine zydis  2.5 mg Oral Daily   OLANZapine zydis  5 mg Oral QHS   pantoprazole  40 mg Oral BID   sodium chloride flush  10-40 mL Intracatheter Q12H   Continuous Infusions:  sodium chloride     sodium chloride Stopped (02/12/19 1737)   sodium chloride Stopped (02/19/19 1422)   cefTRIAXone (ROCEPHIN)  IV 2 g (03/24/19 0900)   PRN Meds:.Place/Maintain arterial line **AND** sodium chloride, sodium chloride, haloperidol lactate, HYDROmorphone (DILAUDID) injection, lidocaine (PF), lidocaine, ondansetron (ZOFRAN) IV, sodium chloride flush, sodium chloride flush, sodium chloride flush  Antibiotics  :   Anti-infectives (From admission, onward)   Start     Dose/Rate Route Frequency Ordered Stop   03/12/19 1700  fluconazole (DIFLUCAN) tablet 400 mg     400 mg Oral Daily 03/12/19 1654     03/09/19 0800  cefTRIAXone (ROCEPHIN) 2 g in sodium chloride 0.9 % 100 mL IVPB     2 g 200 mL/hr over 30 Minutes Intravenous Every 24 hours 03/09/19 0703     02/08/19 2200  piperacillin-tazobactam (ZOSYN) IVPB 3.375 g  Status:  Discontinued     3.375 g 12.5 mL/hr over 240 Minutes Intravenous Every 8 hours 02/08/19 1552 03/06/19 1638   02/08/19 1545  piperacillin-tazobactam (ZOSYN) IVPB 3.375 g     3.375 g 100 mL/hr over 30 Minutes Intravenous  Once 02/08/19 1533 02/08/19 1840       Objective   Vitals:   03/24/19 0940 03/24/19 0945 03/24/19 0950 03/24/19 1101  BP: (!) 91/46 (!) 81/40 (!) 80/45 (!) 92/51  Pulse:    83  Resp:      Temp:        TempSrc:      SpO2:      Weight:      Height:        SpO2: 100 %  Wt Readings from Last 3 Encounters:  03/23/19 (!) 168 kg  01/15/19 99.8 kg  01/07/19 99.8 kg     Intake/Output Summary (Last 24 hours) at 03/24/2019 1308 Last data filed at 03/24/2019 0900 Gross per 24 hour  Intake 420 ml  Output 100 ml  Net 320 ml    Physical Exam:  Chronically ill-appearing male looks older than stated age Awake Alert, Oriented X self only SEM loud in LUSB/axilla Soft, very distended abdomen, non-tender, normal bowel sounds Ostomy bag in place 2+ pitting edema of bilateral lower extremities  No new F.N deficits,     I have personally reviewed the following:   Data Reviewed:  CBC Recent Labs  Lab 03/18/19 0817 03/20/19 1004 03/24/19 0808  WBC 24.8* 19.4* 18.8*  HGB 7.8* 7.5* 6.7*  HCT 24.3* 23.7* 20.9*  PLT PLATELET CLUMPS NOTED ON SMEAR, COUNT APPEARS DECREASED 122* 150  MCV 95.3 94.8 91.7  MCH 30.6 30.0 29.4  MCHC 32.1 31.6 32.1  RDW 18.7* 18.5* 18.6*    Chemistries  Recent Labs  Lab 03/18/19 0817 03/20/19 1004 03/24/19 0808  NA 127* 127* 124*  K 4.3 4.6 5.1  CL 96* 96* 96*  CO2 21* 20* 18*  GLUCOSE 124* 135* 87  BUN 118* 127* 143*  CREATININE 2.82* 3.11* 4.05*  CALCIUM 11.4* 10.7* 9.6  AST 26 33  --   ALT 23 29  --   ALKPHOS 108 118  --   BILITOT 0.3 0.5  --    ------------------------------------------------------------------------------------------------------------------ No results for input(s): CHOL, HDL, LDLCALC, TRIG, CHOLHDL, LDLDIRECT in the last 72 hours.  Lab Results  Component Value Date   HGBA1C 3.9 (L) 02/09/2019   ------------------------------------------------------------------------------------------------------------------ No results for input(s): TSH, T4TOTAL, T3FREE, THYROIDAB in the last 72 hours.  Invalid input(s):  FREET3 ------------------------------------------------------------------------------------------------------------------ No results for input(s): VITAMINB12, FOLATE, FERRITIN, TIBC, IRON, RETICCTPCT in the last 72 hours.  Coagulation profile No results for input(s): INR, PROTIME in the last 168 hours.  No results for input(s): DDIMER in the last 72 hours.  Cardiac Enzymes No results for input(s): CKMB, TROPONINI, MYOGLOBIN in the last 168 hours.  Invalid input(s): CK ------------------------------------------------------------------------------------------------------------------ No results found for: BNP  Micro Results No results found for this or any previous visit (from the past 240 hour(s)).  Radiology Reports CT ABDOMEN PELVIS WO CONTRAST  Result Date: 03/10/2019 CLINICAL DATA:  Sepsis.  Generalized abdominal pain. EXAM: CT ABDOMEN AND PELVIS WITHOUT CONTRAST TECHNIQUE: Multidetector CT imaging of the abdomen and pelvis was performed following the standard protocol without IV contrast. COMPARISON:  March 02, 2019 FINDINGS: Lower chest: There are bilateral pleural effusions that are only partially visualized. There is atelectasis at the lung bases. The heart size appears to be enlarged. Hepatobiliary: The liver is cirrhotic. Normal gallbladder.There is no biliary ductal dilation. Pancreas: Normal contours without ductal dilatation. No peripancreatic fluid collection. Spleen: The spleen is enlarged.  Adrenals/Urinary Tract: --Adrenal glands: No adrenal hemorrhage. --Right kidney/ureter: No hydronephrosis or perinephric hematoma. --Left kidney/ureter: No hydronephrosis or perinephric hematoma. --Urinary bladder: Unremarkable. Stomach/Bowel: --Stomach/Duodenum: Oral contrast is noted within the stomach. --Small bowel: No dilatation or inflammation. --Colon: No focal abnormality. --Appendix: The appendix is not clearly identified. Vascular/Lymphatic: Atherosclerotic calcification is present  within the non-aneurysmal abdominal aorta, without hemodynamically significant stenosis. Portosystemic shunting is noted. --No retroperitoneal lymphadenopathy. --No mesenteric lymphadenopathy. --No pelvic or inguinal lymphadenopathy. Reproductive: Unremarkable Other: Extensive pneumoperitoneum is again noted, decreased in volume from the prior study. There is a large volume of abdominal ascites, decreased from prior study. There is new thickening of the peritoneum. There is diffuse mesenteric edema. There is anasarca. Musculoskeletal. There is an old healed fracture deformity of the right proximal hip with subluxation of the femoral head. There is no acute displaced fracture. IMPRESSION: 1. Large volume pneumoperitoneum, decreased from prior study. The exact site of bowel perforation is not identified on this exam. 2. Large volume abdominal free fluid, decreased from prior study. 3. Probable peritonitis, although not well evaluated in the absence of IV contrast. 4. Anasarca. 5. Bilateral pleural effusions. 6. Cirrhosis with stigmata of portal hypertension. Electronically Signed   By: Katherine Mantle M.D.   On: 03/10/2019 00:38   CT ABDOMEN PELVIS WO CONTRAST  Result Date: 03/02/2019 CLINICAL DATA:  Inpatient. Worsening abdominal pain and distention. Known distal gastric perforation and pneumoperitoneum. GI bleed. Cirrhosis. EXAM: CT ABDOMEN AND PELVIS WITHOUT CONTRAST TECHNIQUE: Multidetector CT imaging of the abdomen and pelvis was performed following the standard protocol without IV contrast. COMPARISON:  02/17/2019 CT abdomen/pelvis. FINDINGS: Lower chest: Small dependent bilateral pleural effusions, slightly increased bilaterally. Mild dependent right lung base atelectasis. Dense left lower lobe consolidation with air bronchograms, similar. Stable lower esophageal varices. Symmetric stable moderate gynecomastia. Hepatobiliary: Atrophic liver with diffusely irregular liver surface, compatible with  cirrhosis. No liver masses. Layering sludge with possible tiny calcified gallstones in the nondistended gallbladder with no gallbladder wall thickening. No biliary ductal dilatation. Pancreas: Normal, with no mass or duct dilation. Spleen: Moderate splenomegaly. Craniocaudal splenic length 19.0 cm. Stable. No splenic mass. Adrenals/Urinary Tract: Normal adrenals. No renal stones. No hydronephrosis. Simple 3.3 cm interpolar right renal cyst. Simple 1.5 cm interpolar left renal cyst. Normal bladder. Stomach/Bowel: Nondistended stomach. Wall thickening in the gastric antrum, unchanged. Irregularity of anterior gastric antrum luminal contour with suggestion of tiny outpouching (series 3/image 46), which does not definitely communicate with large volume anterior peritoneal cavity free air and ascites. Normal caliber small bowel with no small bowel wall thickening. Candidate normal appendix. Scattered fluid levels in the large bowel with no large bowel wall thickening, diverticulosis or acute pericolonic fat stranding. Vascular/Lymphatic: Atherosclerotic nonaneurysmal abdominal aorta. Stable splenic hilar varices. No pathologically enlarged lymph nodes in the abdomen or pelvis. Reproductive: Normal size prostate. Other: Large volume ascites with large volume pneumoperitoneum, not substantially changed from prior CT. Stable small umbilical hernia containing gas and fluid. Anasarca is similar. Musculoskeletal: Chronic severe right hip deformity with surrounding hypertrophic ossific change. Mild thoracolumbar spondylosis. No new focal osseous lesions. IMPRESSION: 1. Large volume ascites with large volume pneumoperitoneum predominantly in the anterior peritoneal cavity, not substantially changed since 02/17/2019 CT. 2. Persistent nonspecific wall thickening in the gastric antrum. Irregular luminal contour in the anterior gastric antrum compatible with contained perforation seen on 02/12/2019 upper GI study. No obvious direct  communication of the gastric lumen with the peritoneal cavity on this limited noncontrast CT study. 3. Cirrhosis.  Stable splenic hilar and lower esophageal varices. 4. Small dependent bilateral pleural effusions, slightly increased bilaterally. Anasarca. 5. Dense left lung base consolidation with air bronchograms, favor atelectasis, with a component of pneumonia not excluded. 6. Fluid levels throughout the large bowel indicative of a nonspecific malabsorptive state. 7.  Aortic Atherosclerosis (ICD10-I70.0). Electronically Signed   By: Delbert Phenix M.D.   On: 03/02/2019 09:23   US Abdomen Complete  Result Date: 03/13/2019 CLINICAL DATA:  Abdominal pain.  Cirrhosis. EXAM: ABDOMEN ULTRASOUND COMPLETE COMPARISON:  Noncontrast CT on 03/09/2019 FINDINGS: Technically difficult evaluation due to patient habitus and bowel gas. Gallbladder: Not visualized. Common bile duct: Diameter: Not visualized. Liver: Diffuse coarsening of hepatic echotexture and capsular nodularity, consistent with cirrhosis. No liver mass identified. Portal vein is patent on color Doppler imaging with normal direction of blood flow towards the liver. IVC: No abnormality visualized. Pancreas: Not visualized due to overlying bowel gas. Spleen: Not visualized Right Kidney: Length: 11.2 cm. Echogenicity within normal limits. A few simple renal cysts are seen, largest measured 3.3 cm. No mass or hydronephrosis visualized. Left Kidney: Not visualized due to patient habitus and bowel gas. Abdominal aorta: No proximal abdominal aortic aneurysm visualized. Distal abdominal aorta not visualized due to overlying bowel gas. Other findings: Mild-to-moderate ascites is seen. IMPRESSION: Technically suboptimal exam due to patient habitus and bowel gas. Hepatic cirrhosis and mild-to-moderate ascites. Electronically Signed   By: Danae Orleans M.D.   On: 03/13/2019 19:05   US Paracentesis  Result Date: 03/24/2019 INDICATION: Patient with history of  hepatitis-C, cirrhosis, esophageal varices, perforated bowel with pneumoperitoneum and recurrent ascites. Request for therapeutic paracentesis today in IR. EXAM: ULTRASOUND GUIDED THERAPEUTIC PARACENTESIS MEDICATIONS: 10 ML 1% LIDOCAINE COMPLICATIONS: None immediate. PROCEDURE: Informed written consent was obtained from the patient after a discussion of the risks, benefits and alternatives to treatment. A timeout was performed prior to the initiation of the procedure. Initial ultrasound scanning demonstrates a large amount of ascites within the right lower abdominal quadrant. The right lower abdomen was prepped and draped in the usual sterile fashion. 1% lidocaine was used for local anesthesia. Following this, a 19 gauge, 7-cm, Yueh catheter was introduced. An ultrasound image was saved for documentation purposes. The paracentesis was performed. The catheter was removed and a dressing was applied. The patient tolerated the procedure well without immediate post procedural complication. FINDINGS: A total of approximately 4.8 L of opaque, light yellow fluid was removed. IMPRESSION: Successful ultrasound-guided paracentesis yielding 4.8 liters of peritoneal fluid. Read by Lynnette Caffey, PA-C Electronically Signed   By: Irish Lack M.D.   On: 03/24/2019 10:08   US Paracentesis  Result Date: 02/25/2019 INDICATION: Cirrhosis with recurrent ascites. Request for therapeutic paracentesis. EXAM: ULTRASOUND GUIDED RIGHT LOWER QUADRANT PARACENTESIS MEDICATIONS: None. COMPLICATIONS: None immediate. PROCEDURE: Informed written consent was obtained from the patient after a discussion of the risks, benefits and alternatives to treatment. A timeout was performed prior to the initiation of the procedure. Initial ultrasound scanning demonstrates a large amount of ascites within the right lower abdominal quadrant. The right lower abdomen was prepped and draped in the usual sterile fashion. 1% lidocaine was used for local  anesthesia. Following this, a 19 gauge, 7-cm, Yueh catheter was introduced. An ultrasound image was saved for documentation purposes. The paracentesis was performed. The catheter was removed and a dressing was applied. The patient tolerated the procedure well without immediate post procedural complication. FINDINGS: A total of approximately 14 L of clear yellow fluid was removed. IMPRESSION: Successful  ultrasound-guided paracentesis yielding 14 liters of peritoneal fluid. Read by: Brayton El PA-C Electronically Signed   By: Irish Lack M.D.   On: 02/25/2019 15:01   DG Abd Portable 1V  Result Date: 02/28/2019 CLINICAL DATA:  Abdominal distension, history of cirrhosis EXAM: PORTABLE ABDOMEN - 1 VIEW COMPARISON:  None FINDINGS: Centralization of bowel loops, which may reflect presence of ascites. Bowel gas pattern is otherwise unremarkable. Chronic degenerative changes and heterotopic ossification at the right hip. IMPRESSION: Bowel gas pattern suggestive of ascites. Electronically Signed   By: Guadlupe Spanish M.D.   On: 02/28/2019 10:33   ECHOCARDIOGRAM COMPLETE  Result Date: 03/11/2019   ECHOCARDIOGRAM REPORT   Patient Name:   SAINTCLAIR SCHROADER Date of Exam: 03/11/2019 Medical Rec #:  [id number]   Height:       78.0 in Accession #:    [id number]  Weight:       260.8 lb Date of Birth:  Mar 25, 1961    BSA:          2.53 m Patient Age:    58 years    BP:           111/62 mmHg Patient Gender: M           HR:           93 bpm. Exam Location:  Inpatient Procedure: 2D Echo Indications:    Murmur 785.2/R01.1  History:        Patient has prior history of Echocardiogram examinations, most                 recent 01/30/2010. Risk Factors:Current Smoker. CKD.  Sonographer:    Ross Ludwig RDCS (AE) Referring Phys: [id number] [facility]  Sonographer Comments: Suboptimal parasternal window and suboptimal subcostal window. Parasteral long and short axis images suboptimal due to chest wall deformity. IMPRESSIONS  1. Left  ventricular ejection fraction, by visual estimation, is 70 to 75%. The left ventricle has hyperdynamic function. There is mildly increased left ventricular hypertrophy.  2. Left ventricular diastolic parameters are consistent with Grade I diastolic dysfunction (impaired relaxation).  3. The left ventricle has no regional wall motion abnormalities.  4. There is SAM of the anterior mitral valve leaflet in the setting of moderate basal septal hypertrophy and a hyperdynamic LV creating a dynamic LVOT gradient. Difficult Doppler assessment of gradient to seperate from MR signal. The peak gradient appears to be 65 mmHg.  5. Global right ventricle has normal systolic function.The right ventricular size is normal. No increase in right ventricular wall thickness.  6. Left atrial size was mild-moderately dilated.  7. Right atrial size was mildly dilated.  8. The mitral valve is normal in structure. Mild mitral valve regurgitation. No evidence of mitral stenosis.  9. The tricuspid valve is normal in structure. Tricuspid valve regurgitation is not demonstrated. 10. The aortic valve was not well visualized. Aortic valve regurgitation is not visualized. Diffilcult Doppler assessment of AV in setting of dynamic LVOT gradient. Structurally the valve appears normal, no significant stenosis. The elevated gradient appears to be subvalvular. 11. The pulmonic valve was not well visualized. Pulmonic valve regurgitation is not visualized. FINDINGS  Left Ventricle: Left ventricular ejection fraction, by visual estimation, is 70 to 75%. The left ventricle has hyperdynamic function. The left ventricle has no regional wall motion abnormalities. There is mildly increased left ventricular hypertrophy. Left ventricular diastolic parameters are consistent with Grade I diastolic dysfunction (impaired relaxation). Normal left atrial pressure. There is SAM of the anterior mitral  valve leaflet in the setting of moderate basal septal hypertrophy and a  hyperdynamic LV creating a dynamic LVOT gradient. Difficult Doppler assessment of gradient to seperate from MR signal. The peak gradient appears to be 65 mmHg. Right Ventricle: The right ventricular size is normal. No increase in right ventricular wall thickness. Global RV systolic function is has normal systolic function. Left Atrium: Left atrial size was mild-moderately dilated. Right Atrium: Right atrial size was mildly dilated Pericardium: There is no evidence of pericardial effusion. Mitral Valve: The mitral valve is normal in structure. Mild mitral valve regurgitation. No evidence of mitral valve stenosis by observation. MV peak gradient, 9.5 mmHg. Tricuspid Valve: The tricuspid valve is normal in structure. Tricuspid valve regurgitation is not demonstrated. Aortic Valve: The aortic valve was not well visualized. Aortic valve regurgitation is not visualized. Diffilcult Doppler assessment of AV in setting of dynamic LVOT gradient. Structurally the valve appears normal, no significant stenosis. The elevated gradient appears to be subvalvular. Aortic valve mean gradient measures 32.0 mmHg. Aortic valve peak gradient measures 67.4 mmHg. Pulmonic Valve: The pulmonic valve was not well visualized. Pulmonic valve regurgitation is not visualized. Pulmonic regurgitation is not visualized. Aorta: The aortic root is normal in size and structure. Pulmonary Artery: Indeterminant PASP, inadequate TR jet. IAS/Shunts: No atrial level shunt detected by color flow Doppler.  LEFT VENTRICLE PLAX 2D LVIDd:         3.70 cm  Diastology LVIDs:         2.30 cm  LV e' lateral:   10.10 cm/s LV PW:         1.10 cm  LV E/e' lateral: 10.9 LV IVS:        1.10 cm  LV e' medial:    7.83 cm/s LVOT diam:     2.00 cm  LV E/e' medial:  14.0 LV SV:         40 ml LV SV Index:   15.55 LVOT Area:     3.14 cm  RIGHT VENTRICLE RV Basal diam:  2.80 cm RV S prime:     22.90 cm/s TAPSE (M-mode): 2.9 cm LEFT ATRIUM              Index       RIGHT ATRIUM            Index LA diam:        2.60 cm  1.03 cm/m  RA Area:     21.50 cm LA Vol (A2C):   131.0 ml 51.84 ml/m RA Volume:   60.90 ml  24.10 ml/m LA Vol (A4C):   76.6 ml  30.31 ml/m LA Biplane Vol: 100.0 ml 39.57 ml/m  AORTIC VALVE AV Vmax:      410.40 cm/s AV Vmean:     259.800 cm/s AV VTI:       0.743 m AV Peak Grad: 67.4 mmHg AV Mean Grad: 32.0 mmHg  AORTA Ao Root diam: 3.10 cm MITRAL VALVE MV Area (PHT): 1.77 cm              SHUNTS MV Peak grad:  9.5 mmHg              Systemic Diam: 2.00 cm MV Mean grad:  4.0 mmHg MV Vmax:       1.54 m/s MV Vmean:      97.2 cm/s MV VTI:        0.36 m MV PHT:        124.12 msec MV Decel Time:  428 msec MV E velocity: 110.00 cm/s 103 cm/s MV A velocity: 123.00 cm/s 70.3 cm/s MV E/A ratio:  0.89        1.5  Dina RichJonathan Branch MD Electronically signed by Dina RichJonathan Branch MD Signature Date/Time: 03/11/2019/1:58:02 PM    Final    IR Paracentesis  Result Date: 03/09/2019 INDICATION: History of cirrhosis with recurrent ascites. None contain gastric perforation with pneumoperitoneum. Leukocytosis. Request repeat diagnostic and therapeutic paracentesis. EXAM: ULTRASOUND GUIDED LEFT LOWER QUADRANT PARACENTESIS MEDICATIONS: None. COMPLICATIONS: None immediate. PROCEDURE: Informed written consent was obtained from the patient after a discussion of the risks, benefits and alternatives to treatment. A timeout was performed prior to the initiation of the procedure. Initial ultrasound scanning demonstrates a moderate amount of ascites within the left lower abdominal quadrant. Fluid appears more complex with floating debris on today's exam. The left lower abdomen was prepped and draped in the usual sterile fashion. 1% lidocaine was used for local anesthesia. Following this, a 19 gauge, 7-cm, Yueh catheter was introduced. An ultrasound image was saved for documentation purposes. The paracentesis was performed. The catheter was removed and a dressing was applied. The patient tolerated the  procedure well without immediate post procedural complication. FINDINGS: A total of approximately 1 L of thick, turbid, beige/purulent fluid was removed. Samples were sent to the laboratory as requested by the clinical team. IMPRESSION: Successful ultrasound-guided paracentesis yielding 1 liters of peritoneal fluid. Findings were relayed to the ordering physician. Read by: Brayton ElKevin Bruning PA-C Electronically Signed   By: Gilmer MorJaime  Wagner D.O.   On: 03/09/2019 15:41   IR Paracentesis  Result Date: 03/08/2019 INDICATION: Patient with history of cirrhosis and recurrent ascites. Request to IR for therapeutic paracentesis with post procedure albumin administration. EXAM: ULTRASOUND GUIDED THERAPEUTIC PARACENTESIS MEDICATIONS: 10 mL 1% lidocaine COMPLICATIONS: None immediate. PROCEDURE: Informed written consent was obtained from the patient after a discussion of the risks, benefits and alternatives to treatment. A timeout was performed prior to the initiation of the procedure. Initial ultrasound scanning demonstrates a large amount of ascites within the left lower abdominal quadrant. The left lower abdomen was prepped and draped in the usual sterile fashion. 1% lidocaine was used for local anesthesia. Following this, a 19 gauge, 10 cm, Yueh catheter was introduced. An ultrasound image was saved for documentation purposes. The paracentesis was performed. The catheter was removed and a dressing was applied. The patient tolerated the procedure well without immediate post procedural complication. Patient received post-procedure intravenous albumin; see nursing notes for details. FINDINGS: A total of approximately 9.0 L of clear yellow fluid was removed. IMPRESSION: Successful ultrasound-guided paracentesis yielding 9.0 liters of peritoneal fluid. Read by Lynnette CaffeyShannon Watterson, PA-C Electronically Signed   By: Richarda OverlieAdam  Henn M.D.   On: 03/08/2019 11:04     Time Spent in minutes  30     Laverna PeaceShayla D Asiah Befort M.D on 03/24/2019 at  1:08 PM  To page go to [email or web address] - password [email or web address]

## 2019-03-25 DIAGNOSIS — K7469 Other cirrhosis of liver: Secondary | ICD-10-CM

## 2019-03-25 DIAGNOSIS — K745 Biliary cirrhosis, unspecified: Secondary | ICD-10-CM

## 2019-03-25 DIAGNOSIS — G9341 Metabolic encephalopathy: Secondary | ICD-10-CM

## 2019-03-25 NOTE — Progress Notes (Signed)
Daily Progress Note   Patient Name: [name]       Date: 03/25/2019 DOB: [birth year]  Age: 58 y.o. MRN#: [id number] Attending Physician: Samuella Cota, MD Primary Care Physician: Nolene Ebbs, MD Admit Date: 02/08/2019  Reason for Consultation/Follow-up: Establishing goals of care, Pain control and Psychosocial/spiritual support  Subjective: Patient is speaking but I am unable to understand him.  His friends are present at bedside.  Daleen Snook has brought him chocolate which he seems to be enjoying.  Daleen Snook speaks [place] and [place].  She tells me he is not making sense in either language.    We briefly talked about his decline.  Friends are anticipating his referral to [facility].   Assessment: Patient declining.  Having delirium.  Worsening renal failure.  Eating bites and sips   Patient Profile/HPI:  Palliative Care consult requested for this 58 y.o. 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  CT of abdomen and pelvis revealed pneumoperitoneum from bowel perforation.  Surgery was consulted but patient was not felt to be a surgical candidate due to the high risk from comorbidities.  He has been treated conservatively.  Palliative care was consulted to help address goals.   Length of Stay: 45  Current Medications: Scheduled Meds:  . sodium chloride   Intravenous Once  . benztropine mesylate  0.5 mg Intramuscular Daily  . Chlorhexidine Gluconate Cloth  6 each Topical Daily  . fentaNYL  1 patch Transdermal Q72H  . fluconazole  400 mg Oral Daily  .  gabapentin  100 mg Oral QHS  . haloperidol  2 mg Oral Daily  . Muscle Rub   Topical BID  . OLANZapine zydis  2.5 mg Oral Daily  . OLANZapine zydis  5 mg Oral QHS  . pantoprazole  40 mg Oral BID  . sodium chloride flush  10-40 mL Intracatheter Q12H    Continuous Infusions: . sodium chloride    . sodium chloride Stopped (02/12/19 1737)  . sodium chloride Stopped (02/19/19 1422)  . cefTRIAXone (ROCEPHIN)  IV 2 g (03/25/19 0755)    PRN Meds: Place/Maintain arterial line **AND** sodium chloride, sodium chloride, haloperidol lactate, HYDROmorphone (  DILAUDID) injection, lidocaine (PF), lidocaine, ondansetron (ZOFRAN) IV, sodium chloride flush, sodium chloride flush, sodium chloride flush  Physical Exam        Chronically ill appearing male, in bed, + delirium, +Jaundice CV rrr resp no distress Abdomen distended and firm.  Vital Signs: BP 108/63 (BP Location: Left Arm)   Pulse 74   Temp 98.3 F (36.8 C)   Resp 20   Ht 6\' 6"  (1.981 m)   Wt (!) 162.4 kg   SpO2 100%   BMI 41.37 kg/m  SpO2: SpO2: 100 % O2 Device: O2 Device: Room Air O2 Flow Rate:    Intake/output summary:   Intake/Output Summary (Last 24 hours) at 03/25/2019 1933 Last data filed at 03/25/2019 1300 Gross per 24 hour  Intake 677 ml  Output 100 ml  Net 577 ml   LBM: Last BM Date: 03/24/19 Baseline Weight: Weight: 99.8 kg Most recent weight: Weight: (!) 162.4 kg       Palliative Assessment/Data:  20%    Flowsheet Rows     Most Recent Value  Intake Tab  Referral Department  Hospitalist  Unit at Time of Referral  ER  Date Notified  02/09/19  Palliative Care Type  New Palliative care  Reason for referral  Clarify Goals of Care  Date of Admission  02/08/19  Date first seen by Palliative Care  02/11/19  # of days Palliative referral response time  2 Day(s)  # of days IP prior to Palliative referral  1  Clinical Assessment  Psychosocial & Spiritual Assessment  Palliative Care Outcomes      Patient  Active Problem List   Diagnosis Date Noted  . Personality disorder (HCC)   . Ascites   . Kidney disease   . Abdominal distension   . Undifferentiated schizophrenia (HCC)   . Hyponatremia 03/22/2019  . Thrombocytopenia (HCC) 03/22/2019  . Hypotension 03/22/2019  . Acute metabolic encephalopathy 03/22/2019  . Rheumatoid arthritis involving multiple sites (HCC)   . Pressure injury of skin 03/01/2019  . Advanced care planning/counseling discussion   . Abdominal distention   . Acute renal failure with acute tubular necrosis superimposed on stage 3b chronic kidney disease (HCC)   . Palliative care encounter   . Palliative care by specialist   . AKI (acute kidney injury) (HCC)   . Goals of care, counseling/discussion   . Septic shock (HCC) 02/09/2019  . Pneumoperitoneum   . Central venous catheter in place   . Abdominal pain 02/08/2019  . Bowel perforation (HCC) 02/08/2019  . Bleeding per rectum   . Acute gastric ulcer   . Duodenal ulcer   . Acute esophagitis   . Acute blood loss anemia   . Hematemesis 01/08/2019  . Prolonged QT interval 01/08/2019  . Esophageal varices in cirrhosis (HCC) 01/08/2019  . CKD (chronic kidney disease) stage 3, GFR 30-59 ml/min 01/08/2019  . Suicidal ideation 01/08/2019  . Closed displaced fracture of neck of left fifth metacarpal bone 01/08/2019  . GI bleed 08/07/2018  . Acute abdominal pain   . Cirrhosis (HCC)   . Generalized abdominal pain   . Hematemesis with nausea   . Evaluation by psychiatric service required   . Acute upper gastrointestinal bleeding 10/15/2017  . PTSD (post-traumatic stress disorder)   . Hypothyroidism   . History of adrenal insufficiency   . H/O diabetes insipidus   . Schizoaffective disorder (HCC) 11/08/2011  . Normocytic anemia   . Liver disease   . Hepatitis C   .  GERD (gastroesophageal reflux disease)     Palliative Care Plan    Recommendations/Plan:  PMT will continue to follow  Do not escalate care.   Focus is now his comfort and happiness.  Goals of Care and Additional Recommendations:  Limitations on Scope of Treatment: Full Comfort Care  Code Status:  DNR  Prognosis:   < 2 weeks   Discharge Planning:  Hospice facility  Care plan was discussed with Friends at bedside.  (Patient has no family).  Thank you for allowing the Palliative Medicine Team to assist in the care of this patient.  Total time spent:  25 min.     Greater than 50%  of this time was spent counseling and coordinating care related to the above assessment and plan.  Norvel Richards, PA-C Palliative Medicine  Please contact Palliative MedicineTeam phone at [phone] for questions and concerns between 7 am - 7 pm.   Please see AMION for individual provider pager numbers.

## 2019-03-25 NOTE — Progress Notes (Signed)
PROGRESS NOTE  Takahiro Godinho ZOX:[id number] DOB: 1961/02/01 DOA: 02/08/2019 PCP: Nolene Ebbs, MD  A & P  Acute metabolic encephalopathy, multifactorial etiology.  Does not have capacity to make decisions. --Continue Cogentin, Haldol, Zyprexa  Status post severe septic shock secondary to perforated bowel, pneumoperitoneum, SBP.  Nonsurgical candidate secondary to extreme medical fragility.  Sepsis resolved. --Continues on fluconazole and ceftriaxone.  No escalation of care.  Decompensated cirrhosis with ascites, hepatitis C, with associated chronic hypotension, thrombocytopenia, hyponatremia status post multiple paracentesis  AKI superimposed on CKD, worsening --Found to be hepatorenal syndrome.  Acute on chronic anemia, worsening --Family does not want to escalate care, no transfusion, limiting blood draws  Focus on comfort and quality of life. --Patient appears to be approaching end-of-life with worsening renal function in setting of cirrhosis, pneumoperitoneum.  Suspect will need hospice criteria soon.  Prognosis dismal. May not survive this hospitalization.  DVT prophylaxis: SCDs Code Status: DNR Family Communication:  Disposition Plan: pending    Murray Hodgkins, MD  [facility] Direct contact: see [email or web address] (further directions at bottom of note if needed) 7PM-7AM contact night coverage as at bottom of note 03/25/2019, 6:25 PM  LOS: 45 days   Significant Hospital Events   .    Consults:  .    Procedures:  .   Significant Diagnostic Tests:  Marland Kitchen    Micro Data:  .    Antimicrobials:  .   Interval History/Subjective  Lying in bed with eyes open, looks at examiner but does not respond with voice.  Objective   Vitals:  Vitals:   03/25/19 0531 03/25/19 1331  BP: 95/61 108/63  Pulse: 81 74  Resp:  20  Temp: 98.3 F (36.8 C)   SpO2: 100% 100%    Exam:  Constitutional.  Appears calm, mildly uncomfortable, nontoxic. Respiratory.  Clear to  auscultation bilaterally.  No wheezes, rales or rhonchi.  Normal respiratory effort. Cardiovascular.  Regular rate and rhythm.  No murmur, rub or gallop.  3+ bilateral lower extremity edema. Psychiatric.  Cannot assess.  Does not follow commands or respond to voice.  I have personally reviewed the following:   Today's Data  .  Marland Kitchen   Scheduled Meds: . sodium chloride   Intravenous Once  . benztropine mesylate  0.5 mg Intramuscular Daily  . Chlorhexidine Gluconate Cloth  6 each Topical Daily  . fentaNYL  1 patch Transdermal Q72H  . fluconazole  400 mg Oral Daily  . gabapentin  100 mg Oral QHS  . haloperidol  2 mg Oral Daily  . Muscle Rub   Topical BID  . OLANZapine zydis  2.5 mg Oral Daily  . OLANZapine zydis  5 mg Oral QHS  . pantoprazole  40 mg Oral BID  . sodium chloride flush  10-40 mL Intracatheter Q12H   Continuous Infusions: . sodium chloride    . sodium chloride Stopped (02/12/19 1737)  . sodium chloride Stopped (02/19/19 1422)  . cefTRIAXone (ROCEPHIN)  IV 2 g (03/25/19 0755)    Principal Problem:   Bowel perforation (HCC) Active Problems:   Schizoaffective disorder (HCC)   PTSD (post-traumatic stress disorder)   History of adrenal insufficiency   GI bleed   Cirrhosis (HCC)   Esophageal varices in cirrhosis (HCC)   CKD (chronic kidney disease) stage 3, GFR 30-59 ml/min   Abdominal pain   Bleeding per rectum   Septic shock (HCC)   Pneumoperitoneum   Central venous catheter in place   Goals of care, counseling/discussion  Palliative care by specialist   AKI (acute kidney injury) [facility])   Palliative care encounter   Acute renal failure with acute tubular necrosis superimposed on stage 3b chronic kidney disease (HCC)   Pressure injury of skin   Advanced care planning/counseling discussion   Abdominal distention   Rheumatoid arthritis involving multiple sites (HCC)   Hyponatremia   Thrombocytopenia (HCC)   Hypotension   Acute metabolic encephalopathy    Abdominal distension   Undifferentiated schizophrenia (HCC)   LOS: 45 days   How to contact the [facility] Attending or Consulting provider 7A - 7P or covering provider during after hours 7P -7A, for this patient?  1. Check the care team in [facility] and look for a) attending/consulting [facility] provider listed and b) the [facility] team listed 2. Log into [email or web address] and use [facility]'s universal password to access. If you do not have the password, please contact the hospital operator. 3. Locate the [facility] provider you are looking for under [facility] and page to a number that you can be directly reached. 4. If you still have difficulty reaching the provider, please page the [facility] (Director on Call) for the Hospitalists listed on amion for assistance.

## 2019-03-26 DIAGNOSIS — K7290 Hepatic failure, unspecified without coma: Secondary | ICD-10-CM

## 2019-03-26 DIAGNOSIS — Z515 Encounter for palliative care: Secondary | ICD-10-CM

## 2019-03-26 DIAGNOSIS — K7210 Chronic hepatic failure without coma: Secondary | ICD-10-CM

## 2019-03-26 MED ORDER — POLYVINYL ALCOHOL 1.4 % OP SOLN
1.0000 [drp] | Freq: Four times a day (QID) | OPHTHALMIC | Status: DC | PRN
  Filled 2019-03-26: qty 15

## 2019-03-26 MED ORDER — GLYCOPYRROLATE 0.2 MG/ML IJ SOLN: 0.2000 mg | INTRAMUSCULAR | Status: DC | PRN

## 2019-03-26 MED ORDER — BIOTENE DRY MOUTH MT LIQD: 15.0000 mL | OROMUCOSAL | Status: DC | PRN

## 2019-03-26 MED ORDER — SODIUM CHLORIDE 0.9 % IV SOLN: 250.0000 mL | INTRAVENOUS | Status: DC | PRN

## 2019-03-26 MED ORDER — SODIUM CHLORIDE 0.9% FLUSH
3.0000 mL | Freq: Two times a day (BID) | INTRAVENOUS | Status: DC
  Administered 2019-03-26 – 2019-03-27 (×2): 3 mL via INTRAVENOUS

## 2019-03-26 MED ORDER — SODIUM CHLORIDE 0.9% FLUSH: 3.0000 mL | INTRAVENOUS | Status: DC | PRN

## 2019-03-26 MED ORDER — HALOPERIDOL LACTATE 2 MG/ML PO CONC
0.5000 mg | ORAL | Status: DC | PRN
  Filled 2019-03-26: qty 0.3

## 2019-03-26 MED ORDER — HALOPERIDOL 0.5 MG PO TABS
0.5000 mg | ORAL_TABLET | ORAL | Status: DC | PRN
  Filled 2019-03-26: qty 1

## 2019-03-26 MED ORDER — HALOPERIDOL LACTATE 5 MG/ML IJ SOLN: 0.5000 mg | INTRAMUSCULAR | Status: DC | PRN

## 2019-03-26 MED ORDER — GLYCOPYRROLATE 1 MG PO TABS
1.0000 mg | ORAL_TABLET | ORAL | Status: DC | PRN
  Filled 2019-03-26: qty 1

## 2019-03-26 NOTE — Plan of Care (Signed)
°  Problem: Activity: °Goal: Risk for activity intolerance will decrease °Outcome: Not Progressing °  °Problem: Nutrition: °Goal: Adequate nutrition will be maintained °Outcome: Not Progressing °  °Problem: Coping: °Goal: Level of anxiety will decrease °Outcome: Not Progressing °  °Problem: Safety: °Goal: Ability to remain free from injury will improve °Outcome: Not Progressing °  °

## 2019-03-26 NOTE — Progress Notes (Signed)
PROGRESS NOTE  [name] VAN:[id number] DOB: 05-May-1960 DOA: 02/08/2019 PCP: Fleet Contras, MD   Brief narrative 58 year old man PMH including cirrhosis, hepatitis C, schizoaffective disorder, PTSD, CKD stage IIIa, presented on 11/12 for abdominal pain.  Admitted for perforated viscus with pneumoperitoneum, thought secondary to gastric ulcer.  Seen by general surgery with recommendation for nonoperative management, IV antibiotics and bowel rest.  Seen by gastroenterology for the same, as well as GI bleed.  Upper GI series revealed contained distal gastric perforation.  Hospital course complicated by patient's lack of capacity based on psychiatric evaluation, failure to thrive, worsening renal failure, development of SBP and persistent metabolic encephalopathy.  Surrogate involved, palliative medicine involved, given lack of improvement, transition to full comfort care in residential hospice will not be pursued.   A & P  Acute metabolic encephalopathy, multifactorial etiology.  Does not have capacity to make decisions.  Complicated by underlying schizoaffective disorder and PTSD. --No improvement noted.  Continue Cogentin, Haldol, Zyprexa  Status post severe septic shock secondary to perforated giant gastric ulcer with pneumoperitoneum.  Nonsurgical candidate secondary to extreme medical fragility.  Sepsis resolved.  Developed SBP.  Peritoneal fluid culture grew Candida. --Now full comfort care.  Antibiotics discontinued.  Decompensated cirrhosis with ascites, hepatitis C, with associated chronic hypotension, thrombocytopenia, portal hypertension, hyponatremia, plenomegaly status post multiple paracentesis --Paracentesis as needed for comfort.  Last paracentesis was stopped after 4 L secondary to hypotension. --Previously on midodrine  AKI superimposed on CKD stage IIIb, worsening --Thought to be secondary to hepatorenal syndrome, failure to thrive.  No further blood draws.  Comfort  care.  Active GI bleed with melena thought secondary to GI gastric ulcer or esophagitis increasing coagulopathy, 11/13.  Acute on chronic anemia, worsening --No further evaluation per surgery.  Depression, schizoaffective disorder, PTSD  Focus on comfort and quality of life. --Two-physician DNR in place.  Appears to be approaching end-of-life with worsening renal function, cirrhosis and other issues this hospitalization.  Agree with full comfort care and transition to residential hospice.  Appreciate palliative medicine medicine involvement.  DVT prophylaxis: SCDs Code Status: DNR Family Communication:  Disposition Plan: pending    Brendia Sacks, MD  [facility] Direct contact: see [email or web address] (further directions at bottom of note if  ) 7PM-7AM contact night coverage as at bottom of note 03/26/2019, 6:16 PM  LOS: 46 days   Significant Hospital Events   . 11/12 admitted for abdominal pain,, pneumoperitoneum thought secondary to perforated gastric/duodenal ulcer.  Seen by general surgery with recommendation for bowel rest and antibiotics. . 11/13 GI consultation . 11/13 critical care consultation, transferred to ICU for septic shock requiring vasopressor support . 11/18 psychiatry consultation . 11/23 psychiatry deemed patient to lack capacity . 12/12, general surgery revisited.  CT showed large volume pneumoperitoneum with decrease.  Probable peritonitis. . 12/28 full comfort care   Consults:  . 11/12 General surgery . 11/13 gastroenterology  . 11/13 critical care medicine . 11/13 interventional radiology . 11/14 nephrology consultation . 11/15 palliative medicine . 11/18 psychiatry consultation   Procedures:  . 11/13 large-volume paracentesis . 11/18 large-volume paracentesis . 11/29 large-volume paracentesis . 12/10 large-volume paracentesis . 12/11 diagnostic paracentesis  Significant Diagnostic Tests:  11/12: CT AP.  Free air in abdomen, definitive source  not identified.  Cirrhosis with numerous varices, severe ascites and splenomegaly.  Cholelithiasis.  Small bowel loops are herniated into a periumbilical hernia with no evidence of obstruction.  Prominent wall of cecum and ascending colon.  Atherosclerosis in aorta.  Prominent mesenteric nodes likely secondary to cirrhosis. 11/16 UGI showed Focal contained perforation in the distal stomach, likely accounting for the free intraperitoneal air on recent CT   Micro Data:  SARS Coronavirus 2 negative 11/12 blood>>  Antimicrobials:  11/12 Pip-Tazo>>   Antimicrobials:  .   Interval History/Subjective  Lying in bed, eyes are open, does not respond to voice.  Objective   Vitals:  Vitals:   03/26/19 0448 03/26/19 1447  BP: 99/60 (!) 80/46  Pulse: 70 64  Resp: 18 (!) 8  Temp: 97.6 F (36.4 C)   SpO2: 99% 99%    Exam:  Constitutional.  Appears calm, nontoxic.   Psychiatric.  Does not respond to voice.  Eyes open. Respiratory.  Clear to auscultation bilaterally.  No wheezes, rales or rhonchi.  Normal respiratory effort. Cardiovascular.  Regular rate and rhythm.  No murmur, rub or gallop.  3+ bilateral lower extremity edema.  I have personally reviewed the following:   Today's Data  . No new data  Scheduled Meds: . sodium chloride   Intravenous Once  . benztropine mesylate  0.5 mg Intramuscular Daily  . Chlorhexidine Gluconate Cloth  6 each Topical Daily  . fentaNYL  1 patch Transdermal Q72H  . fluconazole  400 mg Oral Daily  . gabapentin  100 mg Oral QHS  . haloperidol  2 mg Oral Daily  . Muscle Rub   Topical BID  . OLANZapine zydis  2.5 mg Oral Daily  . OLANZapine zydis  5 mg Oral QHS  . sodium chloride flush  10-40 mL Intracatheter Q12H  . sodium chloride flush  3 mL Intravenous Q12H   Continuous Infusions: . sodium chloride    . sodium chloride Stopped (02/12/19 1737)  . sodium chloride Stopped (02/19/19 1422)  . sodium chloride      Principal Problem:   Bowel  perforation (HCC) Active Problems:   Schizoaffective disorder (HCC)   PTSD (post-traumatic stress disorder)   History of adrenal insufficiency   GI bleed   Cirrhosis (HCC)   Esophageal varices in cirrhosis (HCC)   CKD (chronic kidney disease) stage 3, GFR 30-59 ml/min   Abdominal pain   Bleeding per rectum   Septic shock (HCC)   Pneumoperitoneum   Central venous catheter in place   Goals of care, counseling/discussion   Palliative care by specialist   AKI (acute kidney injury) ([place])   Palliative care encounter   Acute renal failure with acute tubular necrosis superimposed on stage 3b chronic kidney disease ([place])   Pressure injury of skin   Advanced care planning/counseling discussion   Abdominal distention   Rheumatoid arthritis involving multiple sites ([place])   Hyponatremia   Thrombocytopenia (HCC)   Hypotension   Acute metabolic encephalopathy   Abdominal distension   Undifferentiated schizophrenia (HCC)   End stage liver disease (HCC)   Comfort measures only status   LOS: 46 days   How to contact the [facility] Attending or Consulting provider 7A - 7P or covering provider during after hours [place], for this patient?  1. Check the care team in [facility] and look for a) attending/consulting [facility] provider listed and b) the [facility] team listed 2. Log into [email or web address] and use [place]'s universal password to access. If you do not have the password, please contact the hospital operator. 3. Locate the [facility] provider you are looking for under [facility] and page to a number that you can be directly reached. 4. If you still have difficulty reaching the provider,  please page the [facility] (Director on Call) for the Hospitalists listed on amion for assistance.

## 2019-03-26 NOTE — Progress Notes (Signed)
Daily Progress Note   Patient Name: [name]       Date: 03/26/2019 DOB: 09-28-1960  Age: 58 y.o. MRN#: [id number] Attending Physician: Samuella Cota, MD Primary Care Physician: Nolene Ebbs, MD Admit Date: 02/08/2019  Reason for Consultation/Follow-up: Establishing goals of care, Hospice Evaluation, Non pain symptom management, Pain control and Psychosocial/spiritual support  Subjective: [name] is non verbal today.  He opens his eyes and reaches out to me.  He enjoyed some chocolate yesterday but today will not open his mouth for it.   He is lethargic and has increased jaundice.  I spoke with [name] on the phone.  She is very grateful for the care he has received and is accepting of a transfer to Hospice.  Either [name] or [name] is convenient for [name] and the close knit team of friends to visit.   Assessment: [name] man who has a PHd is sociology and speaks fluent english.  He unfortunately has lived with severe PTSD (from the [name] war) and schizoaffective disorder for many years.  His only family was his mother who passed away in 2018/03/12.  He has a small group of close friends [name] is the primary contact) who try to look out for his well being.  [name] is very afraid of dying and was deemed incapable of making his own decisions by psychiatry this admission. His friends made a decision to focus on his happiness and not to push him for [place] decisions.  He was made DNR by the physicians after being admitted with an inoperable perforation in his bowel with contamination in his abdomen.  He stabilized for a period of time on IV antibiotics but his end stage liver disease required frequent large volume paracentesis and he was unable to eat and drink enough to sustain  himself.  This morning [name] is opening his eyes but not eating (not even chocolate or pizza which are his favorites!).  He is not speaking, lethargic and very jaundiced.  He is approaching end of life.  Patient Profile/HPI:  Palliative Care consult requested for this 58 y.o. 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  CT of abdomen  and pelvis revealed pneumoperitoneum from bowel perforation.  Surgery was consulted but patient was not felt to be a surgical candidate due to the high risk from comorbidities.  He has been treated conservatively.  Palliative care was consulted to help address goals.    Length of Stay: 46  Current Medications: Scheduled Meds:  . sodium chloride   Intravenous Once  . benztropine mesylate  0.5 mg Intramuscular Daily  . Chlorhexidine Gluconate Cloth  6 each Topical Daily  . fentaNYL  1 patch Transdermal Q72H  . fluconazole  400 mg Oral Daily  . gabapentin  100 mg Oral QHS  . haloperidol  2 mg Oral Daily  . Muscle Rub   Topical BID  . OLANZapine zydis  2.5 mg Oral Daily  . OLANZapine zydis  5 mg Oral QHS  . sodium chloride flush  10-40 mL Intracatheter Q12H  . sodium chloride flush  3 mL Intravenous Q12H    Continuous Infusions: . sodium chloride    . sodium chloride Stopped (02/12/19 1737)  . sodium chloride Stopped (02/19/19 1422)  . sodium chloride      PRN Meds: Place/Maintain arterial line **AND** sodium chloride, sodium chloride, sodium chloride, antiseptic oral rinse, glycopyrrolate **OR** glycopyrrolate **OR** glycopyrrolate, haloperidol **OR** haloperidol **OR** haloperidol lactate, haloperidol lactate, HYDROmorphone (DILAUDID) injection, lidocaine (PF), lidocaine, ondansetron (ZOFRAN) IV, polyvinyl alcohol, sodium  chloride flush, sodium chloride flush, sodium chloride flush, sodium chloride flush  Physical Exam        Chronically ill appearing, non-verbal and lethargic CV rrr with systolic murmur Resp no distress on room air. Abdomen distended and tight.  Vital Signs: BP 99/60 (BP Location: Left Arm)   Pulse 70   Temp 97.6 F (36.4 C)   Resp 18   Ht 6\' 6"  (1.981 m)   Wt (!) 163.3 kg   SpO2 99%   BMI 41.60 kg/m  SpO2: SpO2: 99 % O2 Device: O2 Device: Room Air O2 Flow Rate:    Intake/output summary:   Intake/Output Summary (Last 24 hours) at 03/26/2019 1059 Last data filed at 03/26/2019 03/28/2019 Gross per 24 hour  Intake 130 ml  Output 300 ml  Net -170 ml   LBM: Last BM Date: 03/24/19 Baseline Weight: Weight: 99.8 kg Most recent weight: Weight: (!) 163.3 kg       Palliative Assessment/Data: 10%   Flowsheet Rows     Most Recent Value  Intake Tab  Referral Department  Hospitalist  Unit at Time of Referral  ER  Date Notified  02/09/19  Palliative Care Type  New Palliative care  Reason for referral  Clarify Goals of Care  Date of Admission  02/08/19  Date first seen by Palliative Care  02/11/19  # of days Palliative referral response time  2 Day(s)  # of days IP prior to Palliative referral  1  Clinical Assessment  Psychosocial & Spiritual Assessment  Palliative Care Outcomes      Patient Active Problem List   Diagnosis Date Noted  . Personality disorder (HCC)   . Ascites   . Kidney disease   . Abdominal distension   . Undifferentiated schizophrenia (HCC)   . Hyponatremia 03/22/2019  . Thrombocytopenia (HCC) 03/22/2019  . Hypotension 03/22/2019  . Acute metabolic encephalopathy 03/22/2019  . Rheumatoid arthritis involving multiple sites (HCC)   . Pressure injury of skin 03/01/2019  . Advanced care planning/counseling discussion   . Abdominal distention   . Acute renal failure with acute tubular necrosis superimposed on stage 3b chronic  kidney disease (HCC)   .  Palliative care encounter   . Palliative care by specialist   . AKI (acute kidney injury) (HCC)   . Goals of care, counseling/discussion   . Septic shock (HCC) 02/09/2019  . Pneumoperitoneum   . Central venous catheter in place   . Abdominal pain 02/08/2019  . Bowel perforation (HCC) 02/08/2019  . Bleeding per rectum   . Acute gastric ulcer   . Duodenal ulcer   . Acute esophagitis   . Acute blood loss anemia   . Hematemesis 01/08/2019  . Prolonged QT interval 01/08/2019  . Esophageal varices in cirrhosis (HCC) 01/08/2019  . CKD (chronic kidney disease) stage 3, GFR 30-59 ml/min 01/08/2019  . Suicidal ideation 01/08/2019  . Closed displaced fracture of neck of left fifth metacarpal bone 01/08/2019  . GI bleed 08/07/2018  . Acute abdominal pain   . Cirrhosis (HCC)   . Generalized abdominal pain   . Hematemesis with nausea   . Evaluation by psychiatric service required   . Acute upper gastrointestinal bleeding 10/15/2017  . PTSD (post-traumatic stress disorder)   . Hypothyroidism   . History of adrenal insufficiency   . H/O diabetes insipidus   . Schizoaffective disorder (HCC) 11/08/2011  . Normocytic anemia   . Liver disease   . Hepatitis C   . GERD (gastroesophageal reflux disease)     Palliative Care Plan    Recommendations/Plan:  Comfort Measures only.  Will DC antibiotics.  Would continue paracentesis for comfort.  Requested TOC to refer for bed at [facility] ([place] or [place])  Close family friend updated and accepting of plan for Hospice.  PMT will follow for symptom management.  Goals of Care and Additional Recommendations:  Limitations on Scope of Treatment: Full Comfort Care  Code Status:  DNR  Prognosis:   < 2 weeks   Discharge Planning:  Hospice facility  Care plan was discussed with RN, Emi Holes Cendric  Thank you for allowing the Palliative Medicine Team to assist in the care of this patient.  Total time spent:  35 min.     Greater  than 50%  of this time was spent counseling and coordinating care related to the above assessment and plan.  Norvel Richards, PA-C Palliative Medicine  Please contact Palliative MedicineTeam phone at [phone] for questions and concerns between 7 am - 7 pm.   Please see AMION for individual provider pager numbers.

## 2019-03-26 NOTE — Progress Notes (Signed)
Responded to Spiritual Consult to have Koran read to Mr. Correll who is nearing the end of his life.  Reseaching on the Internet discovered 5 verses that are recommended to be read at/near time of death.  Entering the room I introduced myself to Mr. Fristoe, telling him I had brought him a recording of the [place].  Though his eyes were open, I don't believe Mr. Reyez understood what I said.  I started the recording of a male voice chanting 15 minutes of Koran verses.  When the recording started Mr. Dalby eyes flew open and he pushed himself up a bit in the bed.  Then he relaxed and rested his head on the pillow and breathed deeply with his eyes closed during the entire 15-minute reading.  Departed from Mr. Hengst room with no further attempt at contact after recording had ended.  De Burrs Chaplain Resident

## 2019-03-26 NOTE — TOC Initial Note (Signed)
Transition of Care [facility]) - Initial/Assessment Note    Patient Details  Name: [name] MRN: [id number] Date of Birth: [birth year]  Transition of Care [facility]) CM/SW Contact:    Marilu Favre, RN Phone Number: 03/26/2019, 11:48 AM  Clinical Narrative:                 Received referral for [place] or [facility]. Called [place] with [employer] she will review chart and speak to [place] and call NCM back.    Expected Discharge Plan: [place] Barriers to Discharge: No SNF bed, Continued Medical Work up   Patient Goals and CMS Choice     Choice offered to / list presented to : Elenore Paddy [phone])  Expected Discharge Plan and Services Expected Discharge Plan: [place] In-house Referral: Clinical Social Work Discharge Planning Services: CM Consult   Living arrangements for the past 2 months: Apartment                 DME Arranged: N/A         HH Arranged: NA          Prior Living Arrangements/Services Living arrangements for the past 2 months: Apartment Lives with:: Self Patient language and need for interpreter reviewed:: Yes        Need for Family Participation in Patient Care: Yes (Comment)(decision making; support) Care giver support system in place?: Yes (comment)(per friend pt family has passed, he only has support from friends) Current home services: DME, Other (comment)(CCN) Criminal Activity/Legal Involvement Pertinent to Current Situation/Hospitalization: No - Comment as needed  Activities of Daily Living      Permission Sought/Granted Permission sought to share information with : Family Supports Permission granted to share information with : No(pt unable to provide thorough details for contacts)  Share Information with NAME: Nadria [phone] [place] granted to share info w AGENCY: CCN  Permission granted to share info w Relationship: friends; Charity fundraiser     Emotional  Assessment Appearance:: Appears stated age Attitude/Demeanor/Rapport: Unable to Assess Affect (typically observed): Unable to Assess Orientation: : Oriented to Self Alcohol / Substance Use: Alcohol Use Psych Involvement: Yes (comment), Outpatient Provider  Admission diagnosis:  Generalized abdominal pain [R10.84] GI bleed [K92.2] Intra-abdominal free air of unknown etiology [K66.8] Septic shock ([place]) [A41.9, R65.21] Patient Active Problem List   Diagnosis Date Noted  . End stage liver disease ([place])   . Comfort measures only status   . Abdominal distension   . Undifferentiated schizophrenia ([place])   . Hyponatremia 03/22/2019  . Thrombocytopenia ([place]) 03/22/2019  . Hypotension 03/22/2019  . Acute metabolic encephalopathy 78/29/5621  . Rheumatoid arthritis involving multiple sites ([place])   . Pressure injury of skin 03/01/2019  . Advanced care planning/counseling discussion   . Abdominal distention   . Acute renal failure with acute tubular necrosis superimposed on stage 3b chronic kidney disease ([place])   . Palliative care encounter   . Palliative care by specialist   . AKI (acute kidney injury) ([place])   . Goals of care, counseling/discussion   . Septic shock ([place]) 02/09/2019  . Pneumoperitoneum   . Central venous catheter in place   . Abdominal pain 02/08/2019  . Bowel perforation ([place]) 02/08/2019  . Bleeding per rectum   . Acute gastric ulcer   . Duodenal ulcer   . Acute esophagitis   . Acute blood loss anemia   . Hematemesis 01/08/2019  . Prolonged QT interval 01/08/2019  .  Esophageal varices in cirrhosis (HCC) 01/08/2019  . CKD (chronic kidney disease) stage 3, GFR 30-59 ml/min 01/08/2019  . Suicidal ideation 01/08/2019  . Closed displaced fracture of neck of left fifth metacarpal bone 01/08/2019  . GI bleed 08/07/2018  . Acute abdominal pain   . Cirrhosis (HCC)   . Generalized abdominal pain   . Hematemesis with nausea   . Evaluation by psychiatric service required    . Acute upper gastrointestinal bleeding 10/15/2017  . PTSD (post-traumatic stress disorder)   . Hypothyroidism   . History of adrenal insufficiency   . H/O diabetes insipidus   . Schizoaffective disorder (HCC) 11/08/2011  . Personality disorder (HCC)   . Normocytic anemia   . Ascites   . Liver disease   . Hepatitis C   . Kidney disease   . GERD (gastroesophageal reflux disease)    PCP:  Fleet Contras, MD Pharmacy:   [facility] [phone] [place], [place] - [address] [place] [address] Phone: [phone] Fax: [phone]     Social Determinants of Health (SDOH) Interventions    Readmission Risk Interventions Readmission Risk Prevention Plan 01/11/2019  Transportation Screening Complete  Medication Review (RN Care Manager) Complete  PCP or Specialist appointment within 3-5 days of discharge Complete  HRI or Home Care Consult Complete  SW Recovery Care/Counseling Consult Complete  Palliative Care Screening Not Applicable  Skilled Nursing Facility Not Applicable  Some recent data might be hidden

## 2019-03-26 NOTE — Progress Notes (Signed)
Manufacturing engineer Documentation  Liaison received referral for pt to dc from hospital for EOL care at [facility]. Writer called pt's friend, Daleen Snook, who confirmed that she feels this is the best discharge plan for pt.   Pt's chart in review by [facility] MD to determine eligibility. We do not currently have any available beds at [facility] but this changes daily.   Thank you for the referral and please call [facility] with any questions.   Freddie Breech, RN [facility] Liaison [phone]

## 2019-03-27 DIAGNOSIS — K252 Acute gastric ulcer with both hemorrhage and perforation: Secondary | ICD-10-CM

## 2019-03-27 MED ORDER — POLYVINYL ALCOHOL 1.4 % OP SOLN: 1.0000 [drp] | Freq: Four times a day (QID) | OPHTHALMIC | Status: AC | PRN

## 2019-03-27 MED ORDER — HEPARIN SOD (PORK) LOCK FLUSH 100 UNIT/ML IV SOLN
250.0000 [IU] | INTRAVENOUS | Status: AC | PRN
  Administered 2019-03-27: 500 [IU]
  Filled 2019-03-27: qty 2.5

## 2019-03-27 MED ORDER — OLANZAPINE 5 MG PO TBDP: 2.5000 mg | ORAL_TABLET | Freq: Every day | ORAL | Status: AC

## 2019-03-27 MED ORDER — OLANZAPINE 5 MG PO TBDP: 5.0000 mg | ORAL_TABLET | Freq: Every day | ORAL | Status: AC

## 2019-03-27 MED ORDER — MUSCLE RUB 10-15 % EX CREA: 1.0000 "application " | TOPICAL_CREAM | Freq: Two times a day (BID) | CUTANEOUS | 0 refills | Status: AC

## 2019-03-27 MED ORDER — GABAPENTIN 100 MG PO CAPS: 100.0000 mg | ORAL_CAPSULE | Freq: Every day | ORAL | Status: AC

## 2019-03-27 MED ORDER — FENTANYL 25 MCG/HR TD PT72: 1.0000 | MEDICATED_PATCH | TRANSDERMAL | 0 refills | Status: AC

## 2019-03-27 MED ORDER — HALOPERIDOL 2 MG PO TABS: 2.0000 mg | ORAL_TABLET | Freq: Every day | ORAL | Status: AC

## 2019-03-27 MED ORDER — BENZTROPINE MESYLATE 1 MG/ML IJ SOLN: 0.5000 mg | Freq: Every day | INTRAMUSCULAR | Status: AC

## 2019-03-27 NOTE — Progress Notes (Signed)
Patient being transferred to [facility], report phoned to [facility]. Patient will leave unit with Picc line in place and foley catheter in place, Clarene Critchley RN at [facility] aware and verbalized they will use central line as needed. Patient will be transported via [place]. Patient transported off unit at 1915 via [place].

## 2019-03-27 NOTE — Discharge Summary (Signed)
Physician Discharge Summary  [name] ZOX:[id number] DOB: 1960-09-02 DOA: 02/08/2019  PCP: Nolene Ebbs, MD  Admit date: 02/08/2019 Discharge date: 03/27/2019  Recommendations for Outpatient Follow-up:  1. Transition to residential hospice     Discharge Diagnoses: Principal diagnosis is #1 1. Septic shock secondary to perforated giant gastric ulcer with pneumoperitoneum 2. Decompensated cirrhosis with ascites, hepatitis C, with associated chronic hypotension, thrombocytopenia, portal hypertension, hyponatremia, splenomegaly status post multiple paracentesis 3. AKI superimposed on CKD stage IIIb 4. Acute metabolic encephalopathy, multifactorial etiology.  Does not have capacity to make decisions. 5. Active GI bleed with melena thought secondary to GI gastric ulcer  6. Acute on chronic anemia, worsening 7. Depression, schizoaffective disorder, PTSD  Discharge Condition: stable, but prognosis poor, <2 weeks Disposition: residential hospice  Diet recommendation: as desired  Filed Weights   03/23/19 1040 03/25/19 0850 03/26/19 0457  Weight: (!) 168 kg (!) 162.4 kg (!) 163.3 kg    History of present illness: 58 year old man PMH including cirrhosis, hepatitis C, schizoaffective disorder, PTSD, CKD stage IIIa, presented on 11/12 for abdominal pain.  Admitted for perforated viscus with pneumoperitoneum, thought secondary to gastric ulcer.    Hospital Course:  Seen by general surgery with recommendation for nonoperative management, IV antibiotics and bowel rest.  Seen by gastroenterology for the same, as well as GI bleed.  Upper GI series revealed contained distal gastric perforation.  Hospital course complicated by patient's lack of capacity based on psychiatric evaluation, failure to thrive, worsening renal failure, development of SBP and persistent metabolic encephalopathy.  Condition did not improve with antibiotics and conservative management.  Given worsening failure to thrive,  lack of oral intake, lethargy, surrogate involved, palliative medicine involved, given lack of improvement, transition was made to full comfort care, will transfer to residential hospice today.  Acute metabolic encephalopathy, multifactorial etiology.  Does not have capacity to make decisions.  Complicated by underlying schizoaffective disorder and PTSD. --No significant improvement noted.  Was treated with Cogentin, Haldol and Zyprexa during hospitalization.   Status post severe septic shock secondary to perforated giant gastric ulcer with pneumoperitoneum.  Nonsurgical candidate secondary to extreme medical fragility.  Sepsis resolved.  Developed SBP.  Peritoneal fluid culture grew Candida. --Did not respond to antibiotics and conservative management.  Continue full comfort care.    Decompensated cirrhosis with ascites, hepatitis C, with associated chronic hypotension, thrombocytopenia, portal hypertension, hyponatremia, plenomegaly status post multiple paracentesis --Status post multiple paracenteses.  Abdomen soft today.   --Paracentesis as needed for comfort.  Last paracentesis was stopped after 4 L secondary to hypotension. --Previously on midodrine  AKI superimposed on CKD stage IIIb, worsening --Secondary to hepatorenal syndrome, failure to thrive.  No further blood draws.  Comfort care.  Active GI bleed with melena thought secondary to GI gastric ulcer   Acute on chronic anemia, worsening --No further evaluation per surgery.  Depression, schizoaffective disorder, PTSD  Focus on comfort and quality of life. --DNR in place.  Appears to be approaching end-of-life with worsening renal function, failure to thrive, cirrhosis, inability to participate in care.   Hospice appropriate.  Significant Hospital Events    11/12 admitted for abdominal pain,, pneumoperitoneum thought secondary to perforated gastric/duodenal ulcer.  Seen by general surgery with recommendation for bowel rest  and antibiotics.  11/13 GI consultation  11/13 critical care consultation, transferred to ICU for septic shock requiring vasopressor support  11/18 psychiatry consultation  11/23 psychiatry deemed patient to lack capacity  12/12, general surgery revisited.  CT showed large  volume pneumoperitoneum with decrease.  Probable peritonitis.  12/28 full comfort care  Consults:   11/12 General surgery  11/13 gastroenterology   11/13 critical care medicine  11/13 interventional radiology  11/14 nephrology consultation  11/15 palliative medicine  11/18 psychiatry consultation  Procedures:   11/13 large-volume paracentesis  11/18 large-volume paracentesis  11/29 large-volume paracentesis  12/10 large-volume paracentesis  12/11 diagnostic paracentesis  Significant Diagnostic Tests:  11/12: CT AP. Free air in abdomen, definitive source not identified. Cirrhosis with numerous varices, severe ascites and splenomegaly. Cholelithiasis. Small bowel loops are herniated into a periumbilical hernia with no evidence of obstruction. Prominent wall of cecum and ascending colon. Atherosclerosis in aorta. Prominent mesenteric nodes likely secondary to cirrhosis. 11/16 UGI showedFocal contained perforation in the distal stomach, likely accounting for the free intraperitoneal air on recent CT  Today's assessment: S: Mumbles incoherently O: Vitals:  Vitals:   03/26/19 1447 03/27/19 0502  BP: (!) 80/46 (!) 89/49  Pulse: 64 77  Resp: (!) 8 12  Temp:    SpO2: 99% 100%    Constitutional.  Lying flat in bed.  Appears mildly anxious.  Mumbles incoherently at times. Respiratory.  Clear to auscultation bilaterally.   Cardiovascular.  Regular rate and rhythm.  No murmur, rub or gallop.  3-4+ bilateral lower extremity edema. Abdomen.  Soft, distended, nontender. Psychiatric.  Cannot assess.  Eyes are open, he does not look at examiner.  Speech is not intelligible.  Discharge  Instructions   Allergies as of 03/27/2019   No Known Allergies     Medication List    STOP taking these medications   [employer] D3 50 MCG (2000 UT) Caps Generic drug: Cholecalciferol   diphenhydrAMINE 25 MG tablet Commonly known as: BENADRYL   lactulose 10 GM/15ML solution Commonly known as: CHRONULAC   Oxycodone HCl 10 MG Tabs   pantoprazole 40 MG tablet Commonly known as: PROTONIX   predniSONE 50 MG tablet Commonly known as: DELTASONE   Sensipar 30 MG tablet Generic drug: cinacalcet     TAKE these medications   benztropine mesylate 1 MG/ML injection Commonly known as: COGENTIN Inject 0.5 mLs (0.5 mg total) into the muscle daily. Start taking on: March 28, 2019   fentaNYL 25 MCG/HR Commonly known as: DURAGESIC Place 1 patch onto the skin every 3 (three) days.   gabapentin 100 MG capsule Commonly known as: NEURONTIN Take 1 capsule (100 mg total) by mouth at bedtime. What changed:   how much to take  when to take this   haloperidol 2 MG tablet Commonly known as: HALDOL Take 1 tablet (2 mg total) by mouth daily. Start taking on: March 28, 2019 What changed: when to take this   Muscle Rub 10-15 % Crea Apply 1 application topically 2 (two) times daily. Apply to joints in hands for arthritis pain   OLANZapine zydis 5 MG disintegrating tablet Commonly known as: ZYPREXA Take 1 tablet (5 mg total) by mouth at bedtime.   OLANZapine zydis 5 MG disintegrating tablet Commonly known as: ZYPREXA Take 0.5 tablets (2.5 mg total) by mouth daily. Start taking on: March 28, 2019   polyvinyl alcohol 1.4 % ophthalmic solution Commonly known as: LIQUIFILM TEARS Place 1 drop into both eyes 4 (four) times daily as needed for dry eyes.      No Known Allergies  The results of significant diagnostics from this hospitalization (including imaging, microbiology, ancillary and laboratory) are listed below for reference.    Significant Diagnostic Studies: CT ABDOMEN  PELVIS  WO CONTRAST  Result Date: 03/10/2019 CLINICAL DATA:  Sepsis.  Generalized abdominal pain. EXAM: CT ABDOMEN AND PELVIS WITHOUT CONTRAST TECHNIQUE: Multidetector CT imaging of the abdomen and pelvis was performed following the standard protocol without IV contrast. COMPARISON:  March 02, 2019 FINDINGS: Lower chest: There are bilateral pleural effusions that are only partially visualized. There is atelectasis at the lung bases. The heart size appears to be enlarged. Hepatobiliary: The liver is cirrhotic. Normal gallbladder.There is no biliary ductal dilation. Pancreas: Normal contours without ductal dilatation. No peripancreatic fluid collection. Spleen: The spleen is enlarged. Adrenals/Urinary Tract: --Adrenal glands: No adrenal hemorrhage. --Right kidney/ureter: No hydronephrosis or perinephric hematoma. --Left kidney/ureter: No hydronephrosis or perinephric hematoma. --Urinary bladder: Unremarkable. Stomach/Bowel: --Stomach/Duodenum: Oral contrast is noted within the stomach. --Small bowel: No dilatation or inflammation. --Colon: No focal abnormality. --Appendix: The appendix is not clearly identified. Vascular/Lymphatic: Atherosclerotic calcification is present within the non-aneurysmal abdominal aorta, without hemodynamically significant stenosis. Portosystemic shunting is noted. --No retroperitoneal lymphadenopathy. --No mesenteric lymphadenopathy. --No pelvic or inguinal lymphadenopathy. Reproductive: Unremarkable Other: Extensive pneumoperitoneum is again noted, decreased in volume from the prior study. There is a large volume of abdominal ascites, decreased from prior study. There is new thickening of the peritoneum. There is diffuse mesenteric edema. There is anasarca. Musculoskeletal. There is an old healed fracture deformity of the right proximal hip with subluxation of the femoral head. There is no acute displaced fracture. IMPRESSION: 1. Large volume pneumoperitoneum, decreased from prior  study. The exact site of bowel perforation is not identified on this exam. 2. Large volume abdominal free fluid, decreased from prior study. 3. Probable peritonitis, although not well evaluated in the absence of IV contrast. 4. Anasarca. 5. Bilateral pleural effusions. 6. Cirrhosis with stigmata of portal hypertension. Electronically Signed   By: Katherine Mantle M.D.   On: 03/10/2019 00:38   CT ABDOMEN PELVIS WO CONTRAST  Result Date: 03/02/2019 CLINICAL DATA:  Inpatient. Worsening abdominal pain and distention. Known distal gastric perforation and pneumoperitoneum. GI bleed. Cirrhosis. EXAM: CT ABDOMEN AND PELVIS WITHOUT CONTRAST TECHNIQUE: Multidetector CT imaging of the abdomen and pelvis was performed following the standard protocol without IV contrast. COMPARISON:  02/17/2019 CT abdomen/pelvis. FINDINGS: Lower chest: Small dependent bilateral pleural effusions, slightly increased bilaterally. Mild dependent right lung base atelectasis. Dense left lower lobe consolidation with air bronchograms, similar. Stable lower esophageal varices. Symmetric stable moderate gynecomastia. Hepatobiliary: Atrophic liver with diffusely irregular liver surface, compatible with cirrhosis. No liver masses. Layering sludge with possible tiny calcified gallstones in the nondistended gallbladder with no gallbladder wall thickening. No biliary ductal dilatation. Pancreas: Normal, with no mass or duct dilation. Spleen: Moderate splenomegaly. Craniocaudal splenic length 19.0 cm. Stable. No splenic mass. Adrenals/Urinary Tract: Normal adrenals. No renal stones. No hydronephrosis. Simple 3.3 cm interpolar right renal cyst. Simple 1.5 cm interpolar left renal cyst. Normal bladder. Stomach/Bowel: Nondistended stomach. Wall thickening in the gastric antrum, unchanged. Irregularity of anterior gastric antrum luminal contour with suggestion of tiny outpouching (series 3/image 46), which does not definitely communicate with large volume  anterior peritoneal cavity free air and ascites. Normal caliber small bowel with no small bowel wall thickening. Candidate normal appendix. Scattered fluid levels in the large bowel with no large bowel wall thickening, diverticulosis or acute pericolonic fat stranding. Vascular/Lymphatic: Atherosclerotic nonaneurysmal abdominal aorta. Stable splenic hilar varices. No pathologically enlarged lymph nodes in the abdomen or pelvis. Reproductive: Normal size prostate. Other: Large volume ascites with large volume pneumoperitoneum, not substantially changed from prior CT. Stable small  umbilical hernia containing gas and fluid. Anasarca is similar. Musculoskeletal: Chronic severe right hip deformity with surrounding hypertrophic ossific change. Mild thoracolumbar spondylosis. No new focal osseous lesions. IMPRESSION: 1. Large volume ascites with large volume pneumoperitoneum predominantly in the anterior peritoneal cavity, not substantially changed since 02/17/2019 CT. 2. Persistent nonspecific wall thickening in the gastric antrum. Irregular luminal contour in the anterior gastric antrum compatible with contained perforation seen on 02/12/2019 upper GI study. No obvious direct communication of the gastric lumen with the peritoneal cavity on this limited noncontrast CT study. 3. Cirrhosis.  Stable splenic hilar and lower esophageal varices. 4. Small dependent bilateral pleural effusions, slightly increased bilaterally. Anasarca. 5. Dense left lung base consolidation with air bronchograms, favor atelectasis, with a component of pneumonia not excluded. 6. Fluid levels throughout the large bowel indicative of a nonspecific malabsorptive state. 7.  Aortic Atherosclerosis (ICD10-I70.0). Electronically Signed   By: Delbert Phenix M.D.   On: 03/02/2019 09:23   US Abdomen Complete  Result Date: 03/13/2019 CLINICAL DATA:  Abdominal pain.  Cirrhosis. EXAM: ABDOMEN ULTRASOUND COMPLETE COMPARISON:  Noncontrast CT on 03/09/2019  FINDINGS: Technically difficult evaluation due to patient habitus and bowel gas. Gallbladder: Not visualized. Common bile duct: Diameter: Not visualized. Liver: Diffuse coarsening of hepatic echotexture and capsular nodularity, consistent with cirrhosis. No liver mass identified. Portal vein is patent on color Doppler imaging with normal direction of blood flow towards the liver. IVC: No abnormality visualized. Pancreas: Not visualized due to overlying bowel gas. Spleen: Not visualized Right Kidney: Length: 11.2 cm. Echogenicity within normal limits. A few simple renal cysts are seen, largest measured 3.3 cm. No mass or hydronephrosis visualized. Left Kidney: Not visualized due to patient habitus and bowel gas. Abdominal aorta: No proximal abdominal aortic aneurysm visualized. Distal abdominal aorta not visualized due to overlying bowel gas. Other findings: Mild-to-moderate ascites is seen. IMPRESSION: Technically suboptimal exam due to patient habitus and bowel gas. Hepatic cirrhosis and mild-to-moderate ascites. Electronically Signed   By: Danae Orleans M.D.   On: 03/13/2019 19:05   US Paracentesis  Result Date: 03/24/2019 INDICATION: Patient with history of hepatitis-C, cirrhosis, esophageal varices, perforated bowel with pneumoperitoneum and recurrent ascites. Request for therapeutic paracentesis today in IR. EXAM: ULTRASOUND GUIDED THERAPEUTIC PARACENTESIS MEDICATIONS: 10 ML 1% LIDOCAINE COMPLICATIONS: None immediate. PROCEDURE: Informed written consent was obtained from the patient after a discussion of the risks, benefits and alternatives to treatment. A timeout was performed prior to the initiation of the procedure. Initial ultrasound scanning demonstrates a large amount of ascites within the right lower abdominal quadrant. The right lower abdomen was prepped and draped in the usual sterile fashion. 1% lidocaine was used for local anesthesia. Following this, a 19 gauge, 7-cm, Yueh catheter was  introduced. An ultrasound image was saved for documentation purposes. The paracentesis was performed. The catheter was removed and a dressing was applied. The patient tolerated the procedure well without immediate post procedural complication. FINDINGS: A total of approximately 4.8 L of opaque, light yellow fluid was removed. IMPRESSION: Successful ultrasound-guided paracentesis yielding 4.8 liters of peritoneal fluid. Read by Lynnette Caffey, PA-C Electronically Signed   By: Irish Lack M.D.   On: 03/24/2019 10:08   US Paracentesis  Result Date: 02/25/2019 INDICATION: Cirrhosis with recurrent ascites. Request for therapeutic paracentesis. EXAM: ULTRASOUND GUIDED RIGHT LOWER QUADRANT PARACENTESIS MEDICATIONS: None. COMPLICATIONS: None immediate. PROCEDURE: Informed written consent was obtained from the patient after a discussion of the risks, benefits and alternatives to treatment. A timeout was performed prior to  the initiation of the procedure. Initial ultrasound scanning demonstrates a large amount of ascites within the right lower abdominal quadrant. The right lower abdomen was prepped and draped in the usual sterile fashion. 1% lidocaine was used for local anesthesia. Following this, a 19 gauge, 7-cm, Yueh catheter was introduced. An ultrasound image was saved for documentation purposes. The paracentesis was performed. The catheter was removed and a dressing was applied. The patient tolerated the procedure well without immediate post procedural complication. FINDINGS: A total of approximately 14 L of clear yellow fluid was removed. IMPRESSION: Successful ultrasound-guided paracentesis yielding 14 liters of peritoneal fluid. Read by: Brayton ElKevin Bruning PA-C Electronically Signed   By: Irish LackGlenn  Yamagata M.D.   On: 02/25/2019 15:01   DG Abd Portable 1V  Result Date: 02/28/2019 CLINICAL DATA:  Abdominal distension, history of cirrhosis EXAM: PORTABLE ABDOMEN - 1 VIEW COMPARISON:  None FINDINGS:  Centralization of bowel loops, which may reflect presence of ascites. Bowel gas pattern is otherwise unremarkable. Chronic degenerative changes and heterotopic ossification at the right hip. IMPRESSION: Bowel gas pattern suggestive of ascites. Electronically Signed   By: Guadlupe SpanishPraneil  Patel M.D.   On: 02/28/2019 10:33   ECHOCARDIOGRAM COMPLETE  Result Date: 03/11/2019   ECHOCARDIOGRAM REPORT   Patient Name:   Juliann PulseDNAN Hudgins Date of Exam: 03/11/2019 Medical Rec #:  [id number]   Height:       78.0 in Accession #:    [id number]  Weight:       260.8 lb Date of Birth:  05/23/1960    BSA:          2.53 m Patient Age:    58 years    BP:           111/62 mmHg Patient Gender: M           HR:           93 bpm. Exam Location:  Inpatient Procedure: 2D Echo Indications:    Murmur 785.2/R01.1  History:        Patient has prior history of Echocardiogram examinations, most                 recent 01/30/2010. Risk Factors:Current Smoker. CKD.  Sonographer:    Ross LudwigArthur Guy RDCS (AE) Referring Phys: [id number] [facility] MI[facility]HAIL  Sonographer Comments: Suboptimal parasternal window and suboptimal subcostal window. Parasteral long and short axis images suboptimal due to chest wall deformity. IMPRESSIONS  1. Left ventricular ejection fraction, by visual estimation, is 70 to 75%. The left ventricle has hyperdynamic function. There is mildly increased left ventricular hypertrophy.  2. Left ventricular diastolic parameters are consistent with Grade I diastolic dysfunction (impaired relaxation).  3. The left ventricle has no regional wall motion abnormalities.  4. There is SAM of the anterior mitral valve leaflet in the setting of moderate basal septal hypertrophy and a hyperdynamic LV creating a dynamic LVOT gradient. Difficult Doppler assessment of gradient to seperate from MR signal. The peak gradient appears to be 65 mmHg.  5. Global right ventricle has normal systolic function.The right ventricular size is normal. No increase in right ventricular wall  thickness.  6. Left atrial size was mild-moderately dilated.  7. Right atrial size was mildly dilated.  8. The mitral valve is normal in structure. Mild mitral valve regurgitation. No evidence of mitral stenosis.  9. The tricuspid valve is normal in structure. Tricuspid valve regurgitation is not demonstrated. 10. The aortic valve was not well visualized. Aortic valve regurgitation is not visualized. Diffilcult Doppler assessment  of AV in setting of dynamic LVOT gradient. Structurally the valve appears normal, no significant stenosis. The elevated gradient appears to be subvalvular. 11. The pulmonic valve was not well visualized. Pulmonic valve regurgitation is not visualized. FINDINGS  Left Ventricle: Left ventricular ejection fraction, by visual estimation, is 70 to 75%. The left ventricle has hyperdynamic function. The left ventricle has no regional wall motion abnormalities. There is mildly increased left ventricular hypertrophy. Left ventricular diastolic parameters are consistent with Grade I diastolic dysfunction (impaired relaxation). Normal left atrial pressure. There is SAM of the anterior mitral valve leaflet in the setting of moderate basal septal hypertrophy and a hyperdynamic LV creating a dynamic LVOT gradient. Difficult Doppler assessment of gradient to seperate from MR signal. The peak gradient appears to be 65 mmHg. Right Ventricle: The right ventricular size is normal. No increase in right ventricular wall thickness. Global RV systolic function is has normal systolic function. Left Atrium: Left atrial size was mild-moderately dilated. Right Atrium: Right atrial size was mildly dilated Pericardium: There is no evidence of pericardial effusion. Mitral Valve: The mitral valve is normal in structure. Mild mitral valve regurgitation. No evidence of mitral valve stenosis by observation. MV peak gradient, 9.5 mmHg. Tricuspid Valve: The tricuspid valve is normal in structure. Tricuspid valve regurgitation  is not demonstrated. Aortic Valve: The aortic valve was not well visualized. Aortic valve regurgitation is not visualized. Diffilcult Doppler assessment of AV in setting of dynamic LVOT gradient. Structurally the valve appears normal, no significant stenosis. The elevated gradient appears to be subvalvular. Aortic valve mean gradient measures 32.0 mmHg. Aortic valve peak gradient measures 67.4 mmHg. Pulmonic Valve: The pulmonic valve was not well visualized. Pulmonic valve regurgitation is not visualized. Pulmonic regurgitation is not visualized. Aorta: The aortic root is normal in size and structure. Pulmonary Artery: Indeterminant PASP, inadequate TR jet. IAS/Shunts: No atrial level shunt detected by color flow Doppler.  LEFT VENTRICLE PLAX 2D LVIDd:         3.70 cm  Diastology LVIDs:         2.30 cm  LV e' lateral:   10.10 cm/s LV PW:         1.10 cm  LV E/e' lateral: 10.9 LV IVS:        1.10 cm  LV e' medial:    7.83 cm/s LVOT diam:     2.00 cm  LV E/e' medial:  14.0 LV SV:         40 ml LV SV Index:   15.55 LVOT Area:     3.14 cm  RIGHT VENTRICLE RV Basal diam:  2.80 cm RV S prime:     22.90 cm/s TAPSE (M-mode): 2.9 cm LEFT ATRIUM              Index       RIGHT ATRIUM           Index LA diam:        2.60 cm  1.03 cm/m  RA Area:     21.50 cm LA Vol (A2C):   131.0 ml 51.84 ml/m RA Volume:   60.90 ml  24.10 ml/m LA Vol (A4C):   76.6 ml  30.31 ml/m LA Biplane Vol: 100.0 ml 39.57 ml/m  AORTIC VALVE AV Vmax:      410.40 cm/s AV Vmean:     259.800 cm/s AV VTI:       0.743 m AV Peak Grad: 67.4 mmHg AV Mean Grad: 32.0 mmHg  AORTA Ao Root  diam: 3.10 cm MITRAL VALVE MV Area (PHT): 1.77 cm              SHUNTS MV Peak grad:  9.5 mmHg              Systemic Diam: 2.00 cm MV Mean grad:  4.0 mmHg MV Vmax:       1.54 m/s MV Vmean:      97.2 cm/s MV VTI:        0.36 m MV PHT:        124.12 msec MV Decel Time: 428 msec MV E velocity: 110.00 cm/s 103 cm/s MV A velocity: 123.00 cm/s 70.3 cm/s MV E/A ratio:  0.89        1.5   Dina Rich MD Electronically signed by Dina Rich MD Signature Date/Time: 03/11/2019/1:58:02 PM    Final    IR Paracentesis  Result Date: 03/09/2019 INDICATION: History of cirrhosis with recurrent ascites. None contain gastric perforation with pneumoperitoneum. Leukocytosis. Request repeat diagnostic and therapeutic paracentesis. EXAM: ULTRASOUND GUIDED LEFT LOWER QUADRANT PARACENTESIS MEDICATIONS: None. COMPLICATIONS: None immediate. PROCEDURE: Informed written consent was obtained from the patient after a discussion of the risks, benefits and alternatives to treatment. A timeout was performed prior to the initiation of the procedure. Initial ultrasound scanning demonstrates a moderate amount of ascites within the left lower abdominal quadrant. Fluid appears more complex with floating debris on today's exam. The left lower abdomen was prepped and draped in the usual sterile fashion. 1% lidocaine was used for local anesthesia. Following this, a 19 gauge, 7-cm, Yueh catheter was introduced. An ultrasound image was saved for documentation purposes. The paracentesis was performed. The catheter was removed and a dressing was applied. The patient tolerated the procedure well without immediate post procedural complication. FINDINGS: A total of approximately 1 L of thick, turbid, beige/purulent fluid was removed. Samples were sent to the laboratory as requested by the clinical team. IMPRESSION: Successful ultrasound-guided paracentesis yielding 1 liters of peritoneal fluid. Findings were relayed to the ordering physician. Read by: Brayton El PA-C Electronically Signed   By: Gilmer Mor D.O.   On: 03/09/2019 15:41   IR Paracentesis  Result Date: 03/08/2019 INDICATION: Patient with history of cirrhosis and recurrent ascites. Request to IR for therapeutic paracentesis with post procedure albumin administration. EXAM: ULTRASOUND GUIDED THERAPEUTIC PARACENTESIS MEDICATIONS: 10 mL 1% lidocaine  COMPLICATIONS: None immediate. PROCEDURE: Informed written consent was obtained from the patient after a discussion of the risks, benefits and alternatives to treatment. A timeout was performed prior to the initiation of the procedure. Initial ultrasound scanning demonstrates a large amount of ascites within the left lower abdominal quadrant. The left lower abdomen was prepped and draped in the usual sterile fashion. 1% lidocaine was used for local anesthesia. Following this, a 19 gauge, 10 cm, Yueh catheter was introduced. An ultrasound image was saved for documentation purposes. The paracentesis was performed. The catheter was removed and a dressing was applied. The patient tolerated the procedure well without immediate post procedural complication. Patient received post-procedure intravenous albumin; see nursing notes for details. FINDINGS: A total of approximately 9.0 L of clear yellow fluid was removed. IMPRESSION: Successful ultrasound-guided paracentesis yielding 9.0 liters of peritoneal fluid. Read by Lynnette Caffey, PA-C Electronically Signed   By: Richarda Overlie M.D.   On: 03/08/2019 11:04    Labs: Basic Metabolic Panel: Recent Labs  Lab 03/24/19 0808  NA 124*  K 5.1  CL 96*  CO2 18*  GLUCOSE 87  BUN 143*  CREATININE 4.05*  CALCIUM 9.6   CBC: Recent Labs  Lab 03/24/19 0808  WBC 18.8*  HGB 6.7*  HCT 20.9*  MCV 91.7  PLT 150   CBG: Recent Labs  Lab 03/20/19 2053 03/21/19 0748 03/21/19 1228 03/21/19 1713 03/21/19 2201  GLUCAP 164* 302* 117* 120* 117*    Principal Problem:   Bowel perforation (HCC) Active Problems:   Schizoaffective disorder (HCC)   PTSD (post-traumatic stress disorder)   History of adrenal insufficiency   GI bleed   Cirrhosis (HCC)   Esophageal varices in cirrhosis (HCC)   CKD (chronic kidney disease) stage 3, GFR 30-59 ml/min   Abdominal pain   Bleeding per rectum   Septic shock (HCC)   Pneumoperitoneum   Central venous catheter in place    Goals of care, counseling/discussion   Palliative care by specialist   AKI (acute kidney injury) (HCC)   Palliative care encounter   Acute renal failure with acute tubular necrosis superimposed on stage 3b chronic kidney disease (HCC)   Pressure injury of skin   Advanced care planning/counseling discussion   Abdominal distention   Rheumatoid arthritis involving multiple sites (HCC)   Hyponatremia   Thrombocytopenia (HCC)   Hypotension   Acute metabolic encephalopathy   Abdominal distension   Undifferentiated schizophrenia (HCC)   End stage liver disease (HCC)   Comfort measures only status   Time coordinating discharge: 40 minutes  Signed:  Brendia Sacksaniel Alaisha Eversley, MD  [facility]  03/27/2019, 12:14 PM\

## 2019-03-27 NOTE — TOC Transition Note (Signed)
Transition of Care [facility]) - CM/SW Discharge Note   Patient Details  Name: [name] MRN: [id number] Date of Birth: 09-03-1960  [facility]) CM/SW Contact:  Alexander Mt, [place] Phone Number: 03/27/2019, 12:23 PM   Clinical Narrative:    Pt plan for transfer to [facility] today. Pt friends have complete paperwork, aware of transfer.  [facility] scheduled for 2pm. Will move later if picc line needs to be removed prior to dc. RN aware.   Final next level of care: [facility] [place] Barriers to Discharge: Barriers Resolved   Patient Goals and CMS Choice Choice offered to / list presented to : Elenore Paddy [phone])  Discharge Placement     Patient chooses bed at: Other - please specify in the comment section below:([facility]) Patient to be transferred to facility by: [place] Name of family member notified: pt friend [place] aware Patient and family notified of of transfer: 03/27/19  Discharge Plan and Services In-house Referral: Clinical Social Work Discharge Planning Services: CM Consult      DME Arranged: N/A HH Arranged: NA  Readmission Risk Interventions Readmission Risk Prevention Plan 01/11/2019  Transportation Screening Complete  Medication Review [employer]) Complete  PCP or Specialist appointment within 3-5 days of discharge Complete  HRI or [place] Complete  SW Recovery Care/Counseling Consult Complete  [place] Not Applicable  Some recent data might be hidden

## 2019-03-27 NOTE — Social Work (Signed)
Clinical Social Worker facilitated patient discharge including contacting patient family and facility to confirm patient discharge plans.  Clinical information faxed to facility and family agreeable with plan.  CSW arranged ambulance transport via [facility] to [facility] at 2pm.  RN to call [phone]  with report prior to discharge.  Clinical Social Worker will sign off for now as social work intervention is no longer needed. Please consult [place] again if new need arises.  Westley Hummer, MSW, [place] Social Worker

## 2019-03-27 NOTE — Progress Notes (Signed)
[place] room available for Mr. Kilmer today. Spoke with friend Daleen Snook by phone to confirm plan. She is in process of completing paper work. Dr. Orpah Melter to assume care per [name]'s request.   Please send discharge summary to [phone].  RN please call report to [phone].  Thank you,  Erling Conte, LCSW [phone]

## 2019-03-28 LAB — TYPE AND SCREEN
ABO/RH(D): A POS
Antibody Screen: NEGATIVE
Unit division: 0

## 2019-03-28 LAB — BPAM RBC
Blood Product Expiration Date: 202101262359
Unit Type and Rh: 6200

## 2019-07-21 IMAGING — CR LEFT FOOT - COMPLETE 3+ VIEW
3 series · 3 of 3 positions shown · non-contrast
Comparison: 08/19/2018 left foot radiographs

CLINICAL DATA: Left foot pain. Third through fifth rays cellulitis.

EXAM:
LEFT FOOT - COMPLETE 3+ VIEW

[x foot ap left]
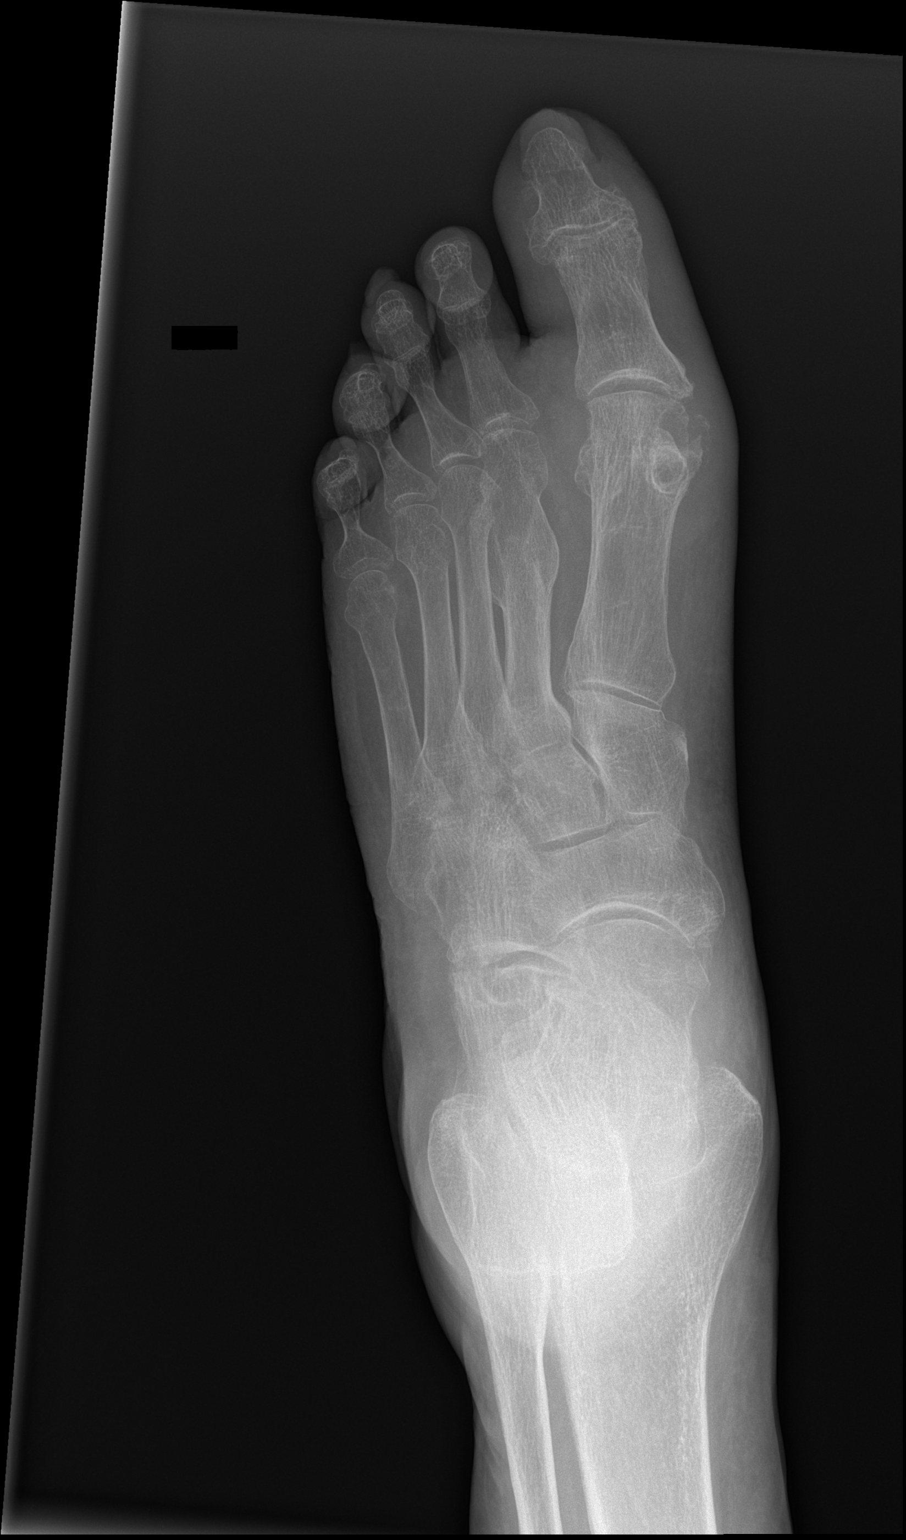

[x foot obl left]
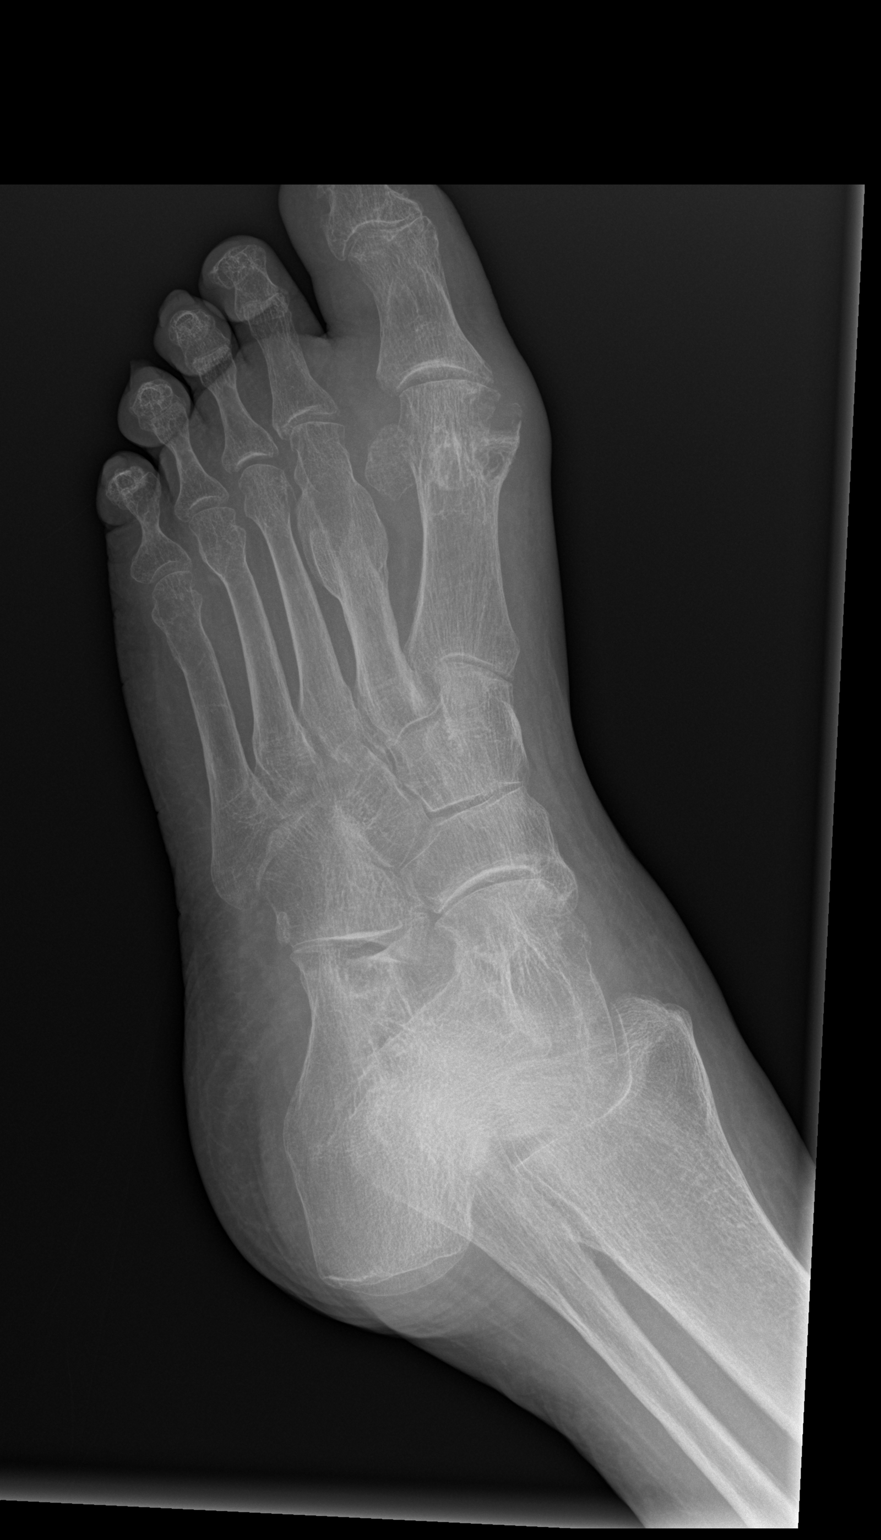

[x foot lat left]
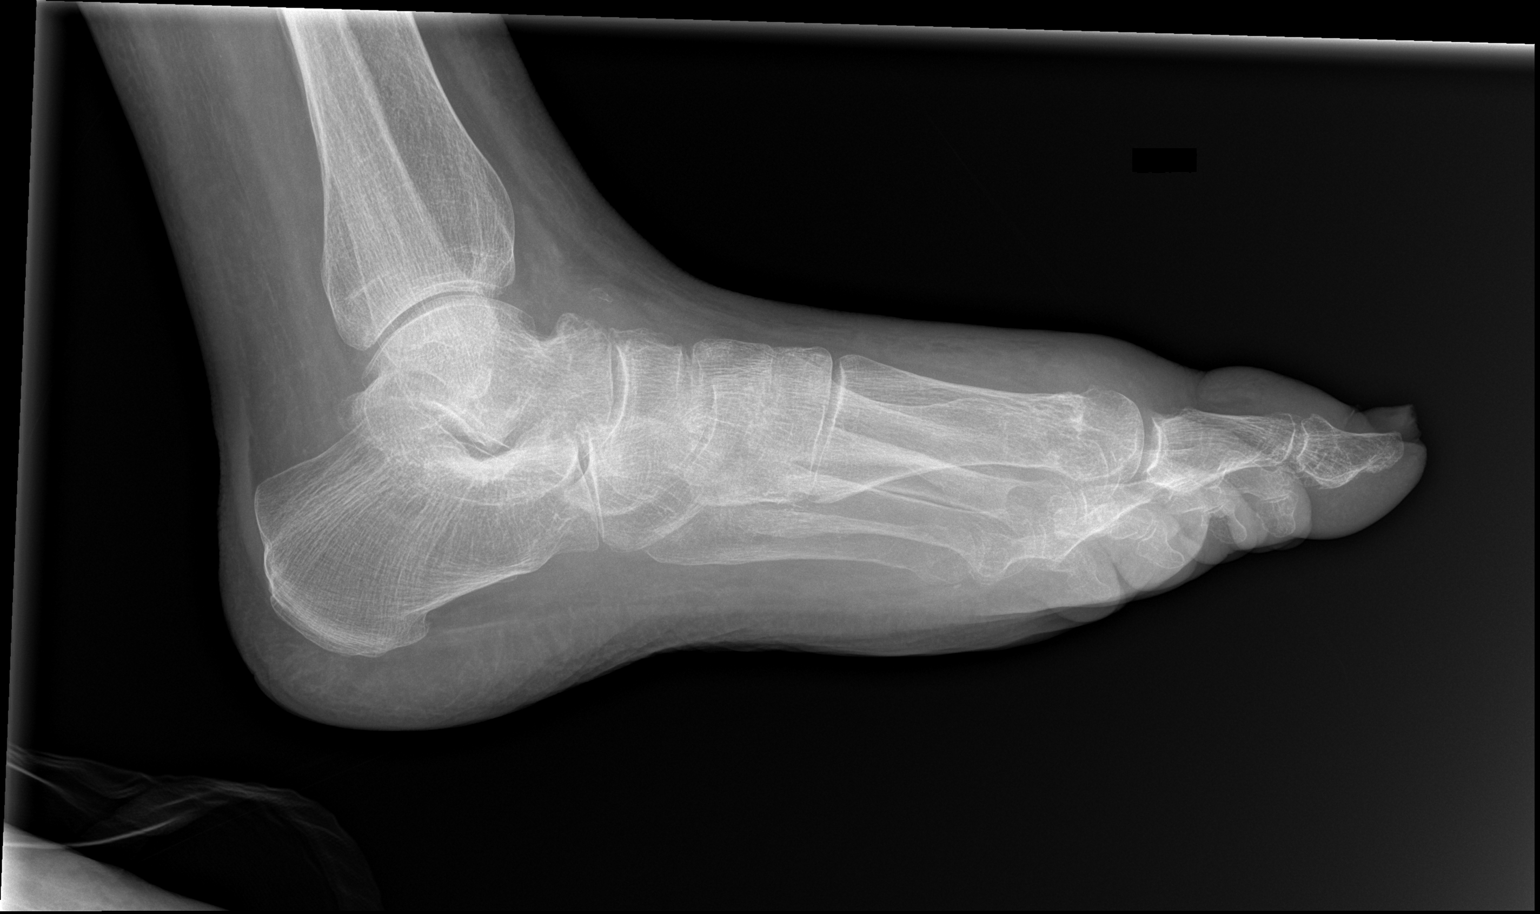

[3 of 3 positions shown; findings below may reference images not displayed]

FINDINGS: No fracture or dislocation. Healed deformity in the second
metatarsal. Distal left foot soft tissue swelling. Stable
juxta-articular erosion with overhanging edge in medial distal left
first metatarsal. No interval osseous erosions. No radiopaque
foreign body. No tophaceous soft tissue calcifications. Diffuse
osteopenia.
IMPRESSION: 1. Chronic appearing gouty erosion in the medial distal left first
metatarsal. No interval osseous erosions.
2. Distal left foot soft tissue swelling. No tophaceous soft tissue
calcifications.
3. Diffuse osteopenia.

## 2020-01-04 IMAGING — DX DG CHEST 1V PORT
1 series · 1 of 1 positions shown · non-contrast
Comparison: Chest x-ray 01/08/2019

CLINICAL DATA: Central venous catheter placement.

EXAM:
PORTABLE CHEST 1 VIEW

[chest ap]
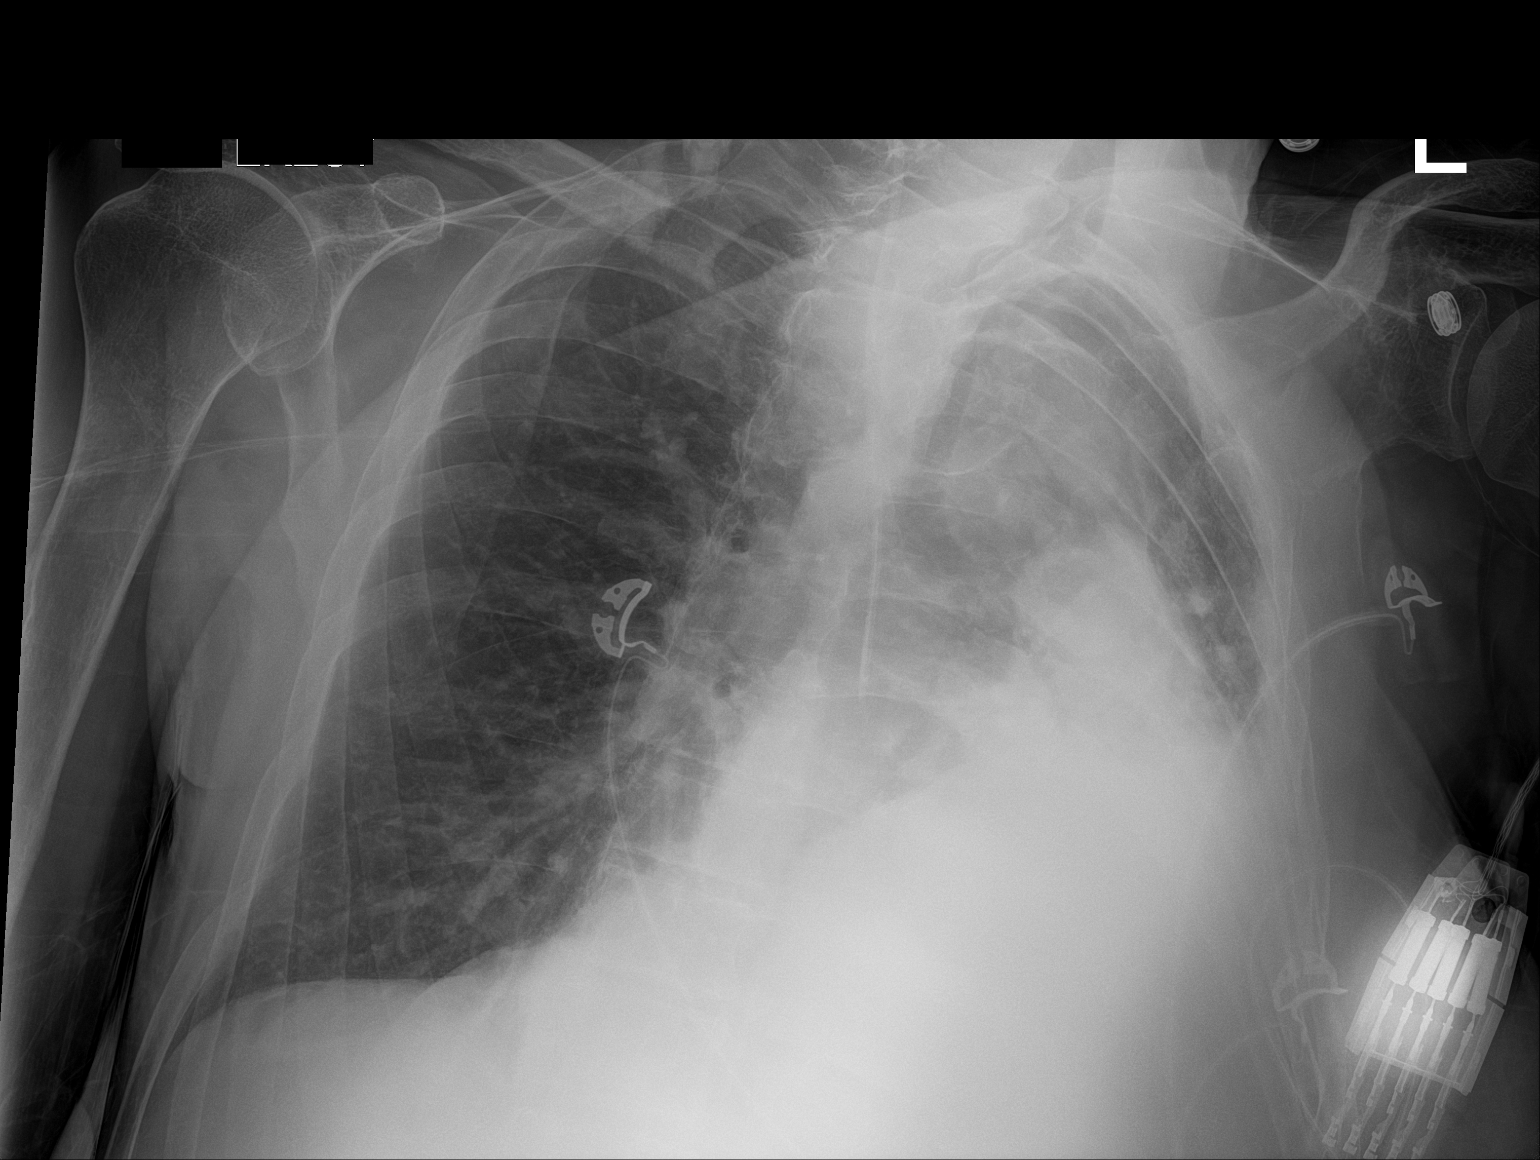

[1 of 1 positions shown; findings below may reference images not displayed]

FINDINGS: The right IJ catheter tip appears to be in good position
approximately 4 cm below the carina. The patient is significantly
rotated to the left but I do not see any evidence of a pneumothorax
or hematoma.
IMPRESSION: Right IJ central venous catheter in good position without
complicating features.

Patient significantly rotated to the left making it difficult to
assess the left lower lobe.

## 2020-01-16 IMAGING — XA IR PICC >5YO
1 series · 1 of 1 positions shown · non-contrast
Comparison: none

CLINICAL DATA: Gastric perforation. Indwelling PICC line placed for
parental nutrition was occluded and now also has been in a
vertically completely pulled out by the patient. Request has been
made to place a new PICC line.

[Series 1: fl(-)  angio sharp · 1 of 1 slices shown]
[im 1/1]
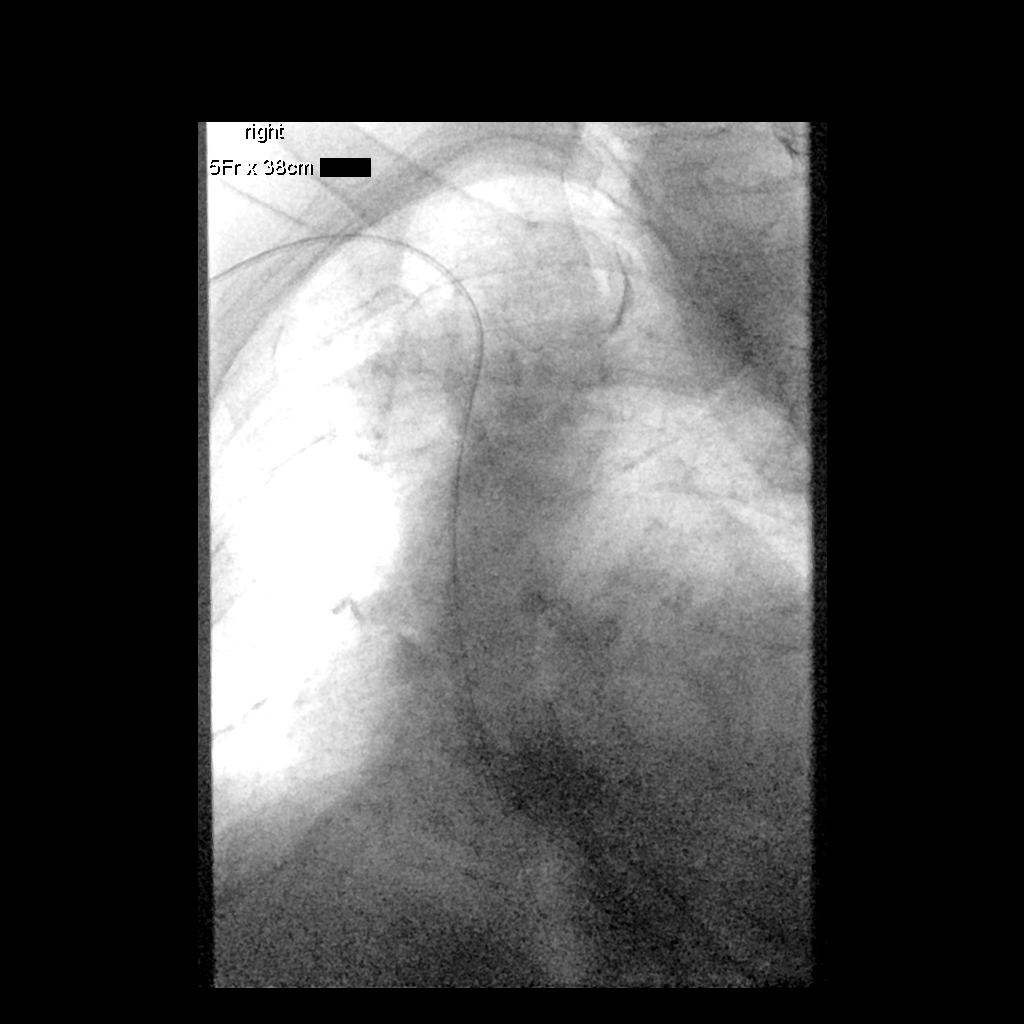

[1 of 1 positions shown; findings below may reference images not displayed]

EXAM:
POWER PICC LINE PLACEMENT WITH ULTRASOUND AND FLUOROSCOPIC GUIDANCE

FLUOROSCOPY TIME:  42 seconds.  2.3 mGy.

PROCEDURE:
Consent for new PICC line placement was obtained emergently from the
admitting service due to the patient's schizoaffective disorder and
inability to obtain consent from a family member. The patient was
then brought to the angiographic suite for the procedure. A time-out
was performed prior to initiating the procedure.

The right arm was prepped with chlorhexidine, draped in the usual
sterile fashion using maximum barrier technique (cap and mask,
sterile gown, sterile gloves, large sterile sheet, hand hygiene and
cutaneous antisepsis) and infiltrated locally with 1% Lidocaine.

Ultrasound demonstrated patency of the right brachial vein, and this
was documented with an image. Under real-time ultrasound guidance,
this vein was accessed with a 21 gauge micropuncture needle and
image documentation was performed. A [DATE] wire was introduced in to
the vein. Over this, a 5.0 French dual lumen power injectable PICC
was advanced to the lower SVC/right atrial junction. Fluoroscopy
during the procedure and fluoro spot radiograph confirms appropriate
catheter position. Prolene retention sutures were applied at the
catheter exit site. The catheter was flushed and covered with a
sterile dressing.

Catheter length: 38 cm

COMPLICATIONS:
None
IMPRESSION: Successful right arm power injectable PICC line placement with
ultrasound and fluoroscopic guidance. The catheter is ready for use.
